# Patient Record
Sex: Female | Born: 1945
Health system: Southern US, Community
[De-identification: ages and names within clinical notes are randomized; demographics above are authoritative.]

## PROBLEM LIST (undated history)

## (undated) DIAGNOSIS — E785 Hyperlipidemia, unspecified: Secondary | ICD-10-CM

## (undated) DIAGNOSIS — M1990 Unspecified osteoarthritis, unspecified site: Secondary | ICD-10-CM

## (undated) DIAGNOSIS — I10 Essential (primary) hypertension: Secondary | ICD-10-CM

## (undated) DIAGNOSIS — F419 Anxiety disorder, unspecified: Secondary | ICD-10-CM

## (undated) DIAGNOSIS — L409 Psoriasis, unspecified: Secondary | ICD-10-CM

## (undated) HISTORY — DX: Hyperlipidemia, unspecified: E78.5

## (undated) HISTORY — DX: Unspecified osteoarthritis, unspecified site: M19.90

## (undated) HISTORY — PX: ABDOMINAL HYSTERECTOMY: SHX81

## (undated) HISTORY — PX: TUBAL LIGATION: SHX77

## (undated) HISTORY — PX: CHOLECYSTECTOMY: SHX55

## (undated) HISTORY — PX: HERNIA REPAIR: SHX51

## (undated) HISTORY — PX: KNEE SURGERY: SHX244

## (undated) HISTORY — DX: Psoriasis, unspecified: L40.9

## (undated) HISTORY — DX: Essential (primary) hypertension: I10

## (undated) HISTORY — DX: Anxiety disorder, unspecified: F41.9

---

## 2004-12-03 ENCOUNTER — Encounter: Admission: RE | Admit: 2004-12-03 | Discharge: 2005-03-03 | Payer: Self-pay | Admitting: Orthopedic Surgery

## 2011-11-08 ENCOUNTER — Encounter: Payer: Self-pay | Admitting: Internal Medicine

## 2011-11-08 DIAGNOSIS — L408 Other psoriasis: Secondary | ICD-10-CM

## 2011-11-08 DIAGNOSIS — D72829 Elevated white blood cell count, unspecified: Secondary | ICD-10-CM

## 2015-05-18 DIAGNOSIS — I10 Essential (primary) hypertension: Secondary | ICD-10-CM | POA: Diagnosis not present

## 2015-06-19 DIAGNOSIS — R5381 Other malaise: Secondary | ICD-10-CM | POA: Diagnosis not present

## 2015-06-19 DIAGNOSIS — R531 Weakness: Secondary | ICD-10-CM | POA: Diagnosis not present

## 2015-06-19 DIAGNOSIS — Z833 Family history of diabetes mellitus: Secondary | ICD-10-CM | POA: Diagnosis not present

## 2015-06-19 DIAGNOSIS — I10 Essential (primary) hypertension: Secondary | ICD-10-CM | POA: Diagnosis not present

## 2015-06-19 DIAGNOSIS — Z79899 Other long term (current) drug therapy: Secondary | ICD-10-CM | POA: Diagnosis not present

## 2015-06-19 DIAGNOSIS — R7309 Other abnormal glucose: Secondary | ICD-10-CM | POA: Diagnosis not present

## 2015-07-17 DIAGNOSIS — I10 Essential (primary) hypertension: Secondary | ICD-10-CM | POA: Diagnosis not present

## 2015-08-16 DIAGNOSIS — I10 Essential (primary) hypertension: Secondary | ICD-10-CM | POA: Diagnosis not present

## 2015-08-16 DIAGNOSIS — R079 Chest pain, unspecified: Secondary | ICD-10-CM | POA: Diagnosis not present

## 2015-09-14 DIAGNOSIS — I10 Essential (primary) hypertension: Secondary | ICD-10-CM | POA: Diagnosis not present

## 2015-09-19 DIAGNOSIS — Z1231 Encounter for screening mammogram for malignant neoplasm of breast: Secondary | ICD-10-CM | POA: Diagnosis not present

## 2015-10-16 DIAGNOSIS — L4050 Arthropathic psoriasis, unspecified: Secondary | ICD-10-CM | POA: Diagnosis not present

## 2015-10-16 DIAGNOSIS — I10 Essential (primary) hypertension: Secondary | ICD-10-CM | POA: Diagnosis not present

## 2015-11-03 DIAGNOSIS — N390 Urinary tract infection, site not specified: Secondary | ICD-10-CM | POA: Diagnosis not present

## 2015-11-03 DIAGNOSIS — M549 Dorsalgia, unspecified: Secondary | ICD-10-CM | POA: Diagnosis not present

## 2015-11-13 DIAGNOSIS — I10 Essential (primary) hypertension: Secondary | ICD-10-CM | POA: Diagnosis not present

## 2015-11-13 DIAGNOSIS — E119 Type 2 diabetes mellitus without complications: Secondary | ICD-10-CM | POA: Diagnosis not present

## 2015-11-13 DIAGNOSIS — E785 Hyperlipidemia, unspecified: Secondary | ICD-10-CM | POA: Diagnosis not present

## 2015-11-13 DIAGNOSIS — E782 Mixed hyperlipidemia: Secondary | ICD-10-CM | POA: Diagnosis not present

## 2015-11-13 DIAGNOSIS — E7800 Pure hypercholesterolemia, unspecified: Secondary | ICD-10-CM | POA: Diagnosis not present

## 2015-11-13 DIAGNOSIS — R5381 Other malaise: Secondary | ICD-10-CM | POA: Diagnosis not present

## 2015-11-13 DIAGNOSIS — Z79899 Other long term (current) drug therapy: Secondary | ICD-10-CM | POA: Diagnosis not present

## 2015-11-13 DIAGNOSIS — N390 Urinary tract infection, site not specified: Secondary | ICD-10-CM | POA: Diagnosis not present

## 2015-11-13 DIAGNOSIS — R531 Weakness: Secondary | ICD-10-CM | POA: Diagnosis not present

## 2015-11-15 DIAGNOSIS — I10 Essential (primary) hypertension: Secondary | ICD-10-CM | POA: Diagnosis not present

## 2015-11-15 DIAGNOSIS — N390 Urinary tract infection, site not specified: Secondary | ICD-10-CM | POA: Diagnosis not present

## 2015-11-15 DIAGNOSIS — E785 Hyperlipidemia, unspecified: Secondary | ICD-10-CM | POA: Diagnosis not present

## 2015-12-20 DIAGNOSIS — I10 Essential (primary) hypertension: Secondary | ICD-10-CM | POA: Diagnosis not present

## 2016-01-24 DIAGNOSIS — Z23 Encounter for immunization: Secondary | ICD-10-CM | POA: Diagnosis not present

## 2016-01-24 DIAGNOSIS — Z79899 Other long term (current) drug therapy: Secondary | ICD-10-CM | POA: Diagnosis not present

## 2016-01-24 DIAGNOSIS — I10 Essential (primary) hypertension: Secondary | ICD-10-CM | POA: Diagnosis not present

## 2016-01-24 DIAGNOSIS — R531 Weakness: Secondary | ICD-10-CM | POA: Diagnosis not present

## 2016-01-24 DIAGNOSIS — R5381 Other malaise: Secondary | ICD-10-CM | POA: Diagnosis not present

## 2016-02-22 DIAGNOSIS — R5381 Other malaise: Secondary | ICD-10-CM | POA: Diagnosis not present

## 2016-02-22 DIAGNOSIS — R531 Weakness: Secondary | ICD-10-CM | POA: Diagnosis not present

## 2016-02-22 DIAGNOSIS — I10 Essential (primary) hypertension: Secondary | ICD-10-CM | POA: Diagnosis not present

## 2016-02-22 DIAGNOSIS — Z79899 Other long term (current) drug therapy: Secondary | ICD-10-CM | POA: Diagnosis not present

## 2016-03-25 DIAGNOSIS — I10 Essential (primary) hypertension: Secondary | ICD-10-CM | POA: Diagnosis not present

## 2016-03-25 DIAGNOSIS — R531 Weakness: Secondary | ICD-10-CM | POA: Diagnosis not present

## 2016-03-25 DIAGNOSIS — Z79899 Other long term (current) drug therapy: Secondary | ICD-10-CM | POA: Diagnosis not present

## 2016-03-25 DIAGNOSIS — R5381 Other malaise: Secondary | ICD-10-CM | POA: Diagnosis not present

## 2016-03-26 DIAGNOSIS — I10 Essential (primary) hypertension: Secondary | ICD-10-CM | POA: Diagnosis not present

## 2016-03-26 DIAGNOSIS — H35032 Hypertensive retinopathy, left eye: Secondary | ICD-10-CM | POA: Diagnosis not present

## 2016-03-26 DIAGNOSIS — H40013 Open angle with borderline findings, low risk, bilateral: Secondary | ICD-10-CM | POA: Diagnosis not present

## 2016-03-26 DIAGNOSIS — H35031 Hypertensive retinopathy, right eye: Secondary | ICD-10-CM | POA: Diagnosis not present

## 2016-04-24 DIAGNOSIS — I10 Essential (primary) hypertension: Secondary | ICD-10-CM | POA: Diagnosis not present

## 2016-04-24 DIAGNOSIS — D631 Anemia in chronic kidney disease: Secondary | ICD-10-CM | POA: Diagnosis not present

## 2016-04-24 DIAGNOSIS — D640 Hereditary sideroblastic anemia: Secondary | ICD-10-CM | POA: Diagnosis not present

## 2016-04-24 DIAGNOSIS — D649 Anemia, unspecified: Secondary | ICD-10-CM | POA: Diagnosis not present

## 2016-04-24 DIAGNOSIS — N181 Chronic kidney disease, stage 1: Secondary | ICD-10-CM | POA: Diagnosis not present

## 2016-05-27 DIAGNOSIS — M25562 Pain in left knee: Secondary | ICD-10-CM | POA: Diagnosis not present

## 2016-05-27 DIAGNOSIS — I10 Essential (primary) hypertension: Secondary | ICD-10-CM | POA: Diagnosis not present

## 2016-06-07 DIAGNOSIS — H40013 Open angle with borderline findings, low risk, bilateral: Secondary | ICD-10-CM | POA: Diagnosis not present

## 2016-06-07 DIAGNOSIS — I10 Essential (primary) hypertension: Secondary | ICD-10-CM | POA: Diagnosis not present

## 2016-06-07 DIAGNOSIS — H2513 Age-related nuclear cataract, bilateral: Secondary | ICD-10-CM | POA: Diagnosis not present

## 2016-06-07 DIAGNOSIS — H35031 Hypertensive retinopathy, right eye: Secondary | ICD-10-CM | POA: Diagnosis not present

## 2016-07-08 DIAGNOSIS — E784 Other hyperlipidemia: Secondary | ICD-10-CM | POA: Diagnosis not present

## 2016-07-08 DIAGNOSIS — J440 Chronic obstructive pulmonary disease with acute lower respiratory infection: Secondary | ICD-10-CM | POA: Diagnosis not present

## 2016-07-08 DIAGNOSIS — F411 Generalized anxiety disorder: Secondary | ICD-10-CM | POA: Diagnosis not present

## 2016-07-08 DIAGNOSIS — R5383 Other fatigue: Secondary | ICD-10-CM | POA: Diagnosis not present

## 2016-07-08 DIAGNOSIS — I10 Essential (primary) hypertension: Secondary | ICD-10-CM | POA: Diagnosis not present

## 2016-07-08 DIAGNOSIS — M545 Low back pain: Secondary | ICD-10-CM | POA: Diagnosis not present

## 2016-07-08 DIAGNOSIS — Z79899 Other long term (current) drug therapy: Secondary | ICD-10-CM | POA: Diagnosis not present

## 2016-08-28 DIAGNOSIS — H40013 Open angle with borderline findings, low risk, bilateral: Secondary | ICD-10-CM | POA: Diagnosis not present

## 2016-08-28 DIAGNOSIS — H2513 Age-related nuclear cataract, bilateral: Secondary | ICD-10-CM | POA: Diagnosis not present

## 2016-08-28 DIAGNOSIS — H35031 Hypertensive retinopathy, right eye: Secondary | ICD-10-CM | POA: Diagnosis not present

## 2016-08-28 DIAGNOSIS — H04123 Dry eye syndrome of bilateral lacrimal glands: Secondary | ICD-10-CM | POA: Diagnosis not present

## 2016-09-05 DIAGNOSIS — F411 Generalized anxiety disorder: Secondary | ICD-10-CM | POA: Diagnosis not present

## 2016-09-05 DIAGNOSIS — R7303 Prediabetes: Secondary | ICD-10-CM | POA: Diagnosis not present

## 2016-09-05 DIAGNOSIS — Z131 Encounter for screening for diabetes mellitus: Secondary | ICD-10-CM | POA: Diagnosis not present

## 2016-09-05 DIAGNOSIS — Z0000 Encounter for general adult medical examination without abnormal findings: Secondary | ICD-10-CM | POA: Diagnosis not present

## 2016-09-05 DIAGNOSIS — I10 Essential (primary) hypertension: Secondary | ICD-10-CM | POA: Diagnosis not present

## 2016-09-05 DIAGNOSIS — L408 Other psoriasis: Secondary | ICD-10-CM | POA: Diagnosis not present

## 2016-09-05 DIAGNOSIS — M545 Low back pain: Secondary | ICD-10-CM | POA: Diagnosis not present

## 2016-09-05 DIAGNOSIS — J440 Chronic obstructive pulmonary disease with acute lower respiratory infection: Secondary | ICD-10-CM | POA: Diagnosis not present

## 2016-09-05 DIAGNOSIS — E784 Other hyperlipidemia: Secondary | ICD-10-CM | POA: Diagnosis not present

## 2016-10-14 DIAGNOSIS — K5180 Other ulcerative colitis without complications: Secondary | ICD-10-CM | POA: Diagnosis not present

## 2016-10-31 DIAGNOSIS — H2513 Age-related nuclear cataract, bilateral: Secondary | ICD-10-CM | POA: Diagnosis not present

## 2016-10-31 DIAGNOSIS — H40013 Open angle with borderline findings, low risk, bilateral: Secondary | ICD-10-CM | POA: Diagnosis not present

## 2016-10-31 DIAGNOSIS — H35032 Hypertensive retinopathy, left eye: Secondary | ICD-10-CM | POA: Diagnosis not present

## 2016-10-31 DIAGNOSIS — H04123 Dry eye syndrome of bilateral lacrimal glands: Secondary | ICD-10-CM | POA: Diagnosis not present

## 2016-11-18 DIAGNOSIS — I10 Essential (primary) hypertension: Secondary | ICD-10-CM | POA: Diagnosis not present

## 2016-11-18 DIAGNOSIS — R1013 Epigastric pain: Secondary | ICD-10-CM | POA: Diagnosis not present

## 2016-11-18 DIAGNOSIS — R197 Diarrhea, unspecified: Secondary | ICD-10-CM | POA: Diagnosis not present

## 2016-11-18 DIAGNOSIS — R1011 Right upper quadrant pain: Secondary | ICD-10-CM | POA: Diagnosis not present

## 2016-11-18 DIAGNOSIS — R109 Unspecified abdominal pain: Secondary | ICD-10-CM | POA: Diagnosis not present

## 2016-11-18 DIAGNOSIS — N390 Urinary tract infection, site not specified: Secondary | ICD-10-CM | POA: Diagnosis not present

## 2016-11-18 DIAGNOSIS — E7800 Pure hypercholesterolemia, unspecified: Secondary | ICD-10-CM | POA: Diagnosis not present

## 2016-11-18 DIAGNOSIS — R319 Hematuria, unspecified: Secondary | ICD-10-CM | POA: Diagnosis not present

## 2016-11-18 DIAGNOSIS — Z8639 Personal history of other endocrine, nutritional and metabolic disease: Secondary | ICD-10-CM | POA: Diagnosis not present

## 2016-11-18 DIAGNOSIS — F17200 Nicotine dependence, unspecified, uncomplicated: Secondary | ICD-10-CM | POA: Diagnosis not present

## 2016-11-18 DIAGNOSIS — Z79899 Other long term (current) drug therapy: Secondary | ICD-10-CM | POA: Diagnosis not present

## 2016-11-26 DIAGNOSIS — K2900 Acute gastritis without bleeding: Secondary | ICD-10-CM | POA: Diagnosis not present

## 2016-11-26 DIAGNOSIS — I10 Essential (primary) hypertension: Secondary | ICD-10-CM | POA: Diagnosis not present

## 2016-12-20 DIAGNOSIS — I10 Essential (primary) hypertension: Secondary | ICD-10-CM | POA: Diagnosis not present

## 2016-12-20 DIAGNOSIS — J440 Chronic obstructive pulmonary disease with acute lower respiratory infection: Secondary | ICD-10-CM | POA: Diagnosis not present

## 2017-02-21 DIAGNOSIS — I10 Essential (primary) hypertension: Secondary | ICD-10-CM | POA: Diagnosis not present

## 2017-02-21 DIAGNOSIS — M170 Bilateral primary osteoarthritis of knee: Secondary | ICD-10-CM | POA: Diagnosis not present

## 2017-02-21 DIAGNOSIS — J440 Chronic obstructive pulmonary disease with acute lower respiratory infection: Secondary | ICD-10-CM | POA: Diagnosis not present

## 2017-02-26 DIAGNOSIS — Z23 Encounter for immunization: Secondary | ICD-10-CM | POA: Diagnosis not present

## 2017-04-28 ENCOUNTER — Encounter (INDEPENDENT_AMBULATORY_CARE_PROVIDER_SITE_OTHER): Payer: Self-pay

## 2017-04-28 ENCOUNTER — Ambulatory Visit (INDEPENDENT_AMBULATORY_CARE_PROVIDER_SITE_OTHER): Payer: Medicare HMO | Admitting: Physician Assistant

## 2017-04-28 ENCOUNTER — Encounter: Payer: Self-pay | Admitting: Physician Assistant

## 2017-04-28 VITALS — BP 91/61 | HR 93 | Temp 97.8°F | Ht 64.0 in | Wt 180.8 lb

## 2017-04-28 DIAGNOSIS — E7841 Elevated Lipoprotein(a): Secondary | ICD-10-CM | POA: Diagnosis not present

## 2017-04-28 DIAGNOSIS — I10 Essential (primary) hypertension: Secondary | ICD-10-CM | POA: Diagnosis not present

## 2017-04-28 DIAGNOSIS — F411 Generalized anxiety disorder: Secondary | ICD-10-CM | POA: Diagnosis not present

## 2017-04-28 DIAGNOSIS — M150 Primary generalized (osteo)arthritis: Secondary | ICD-10-CM

## 2017-04-28 DIAGNOSIS — L409 Psoriasis, unspecified: Secondary | ICD-10-CM | POA: Insufficient documentation

## 2017-04-28 DIAGNOSIS — E785 Hyperlipidemia, unspecified: Secondary | ICD-10-CM | POA: Insufficient documentation

## 2017-04-28 DIAGNOSIS — M159 Polyosteoarthritis, unspecified: Secondary | ICD-10-CM

## 2017-04-28 MED ORDER — HYDROCHLOROTHIAZIDE 25 MG PO TABS: 25.0000 mg | ORAL_TABLET | Freq: Every day | ORAL | 3 refills | Status: DC

## 2017-04-28 MED ORDER — TRAMADOL HCL 50 MG PO TABS: 100.0000 mg | ORAL_TABLET | Freq: Four times a day (QID) | ORAL | 0 refills | Status: DC | PRN

## 2017-04-28 MED ORDER — TRAMADOL HCL 50 MG PO TABS: 100.0000 mg | ORAL_TABLET | Freq: Three times a day (TID) | ORAL | 0 refills | Status: DC | PRN

## 2017-04-28 MED ORDER — ALPRAZOLAM 0.5 MG PO TABS: 0.5000 mg | ORAL_TABLET | Freq: Two times a day (BID) | ORAL | 2 refills | Status: DC

## 2017-04-28 MED ORDER — CITALOPRAM HYDROBROMIDE 20 MG PO TABS: 20.0000 mg | ORAL_TABLET | Freq: Every day | ORAL | 3 refills | Status: DC

## 2017-04-28 NOTE — Progress Notes (Signed)
BP 91/61   Pulse 93   Temp 97.8 F (36.6 C) (Oral)   Ht 5' 4"  (1.626 m)   Wt 180 lb 12.8 oz (82 kg)   BMI 31.03 kg/m    Subjective:    Patient ID: Denise Underwood, female    DOB: Oct 19, 1945, 71 y.o.   MRN: [id number]  HPI: Denise Underwood is a 71 y.o. female presenting on 04/28/2017 for New Patient (Initial Visit) and Establish Care  This patient is a new patient to [place].  She has been seen by Dr. Eula Fried over the past 2 years.  He has retired.  She is brought in by her daughter today.  She needs to be established for multiple medical conditions.  She gotten quite sick with a colitis.  She has lost about 20 pounds.  She feels very weak and tired.  She is particularly dizzy when she stands up.  We have had a long discussion about hypotension.  Because of her weight loss her blood pressure has dropped.  We are going to stop 1 of her blood pressure medicines.  Patient also has a lot of anxiety and depression going on.  It is much worse during the wintertime.  She has had a lot of loss from family members.  She also has arthritis in multiple joints.  There was discussion of knee replacement being needed.  She would like to wait as long as possible.   Relevant past medical, surgical, family and social history reviewed and updated as indicated. Allergies and medications reviewed and updated.  Past Medical History:  Diagnosis Date  . Anxiety   . Arthritis   . Hyperlipidemia   . Hypertension   . Psoriasis     Past Surgical History:  Procedure Laterality Date  . ABDOMINAL HYSTERECTOMY    . CHOLECYSTECTOMY    . HERNIA REPAIR    . KNEE SURGERY    . TUBAL LIGATION      Review of Systems  Constitutional: Positive for fatigue. Negative for activity change and fever.  HENT: Negative.   Eyes: Negative.   Respiratory: Negative.  Negative for cough.   Cardiovascular: Negative.  Negative for chest pain.  Gastrointestinal: Negative.  Negative for abdominal pain.  Endocrine: Negative.     Genitourinary: Negative.  Negative for dysuria.  Musculoskeletal: Positive for arthralgias, back pain and gait problem.  Skin: Negative.   Neurological: Positive for dizziness and weakness.  Psychiatric/Behavioral: Positive for dysphoric mood. Negative for self-injury, sleep disturbance and suicidal ideas. The patient is nervous/anxious.     Allergies as of 04/28/2017   Not on File     Medication List        Accurate as of 04/28/17 10:56 AM. Always use your most recent med list.          ALPRAZolam 0.5 MG tablet Commonly known as:  XANAX Take 1 tablet (0.5 mg total) by mouth 2 (two) times daily. For anxiety.   benazepril 40 MG tablet Commonly known as:  LOTENSIN Take 40 mg by mouth daily.   citalopram 20 MG tablet Commonly known as:  CELEXA Take 1 tablet (20 mg total) by mouth daily. For depression   Fish Oil 1000 MG Caps Take by mouth.   hydrochlorothiazide 25 MG tablet Commonly known as:  HYDRODIURIL Take 1 tablet (25 mg total) by mouth daily. For blood pressure and fluid   mometasone 0.1 % cream Commonly known as:  ELOCON Apply topically 2 (two) times daily.   rosuvastatin 20 MG  tablet Commonly known as:  CRESTOR Take 20 mg by mouth daily.   traMADol 50 MG tablet Commonly known as:  ULTRAM Take 2 tablets (100 mg total) by mouth every 6 (six) hours as needed. For pain   Vitamin D3 1000 units Caps Take by mouth.          Objective:    BP 91/61   Pulse 93   Temp 97.8 F (36.6 C) (Oral)   Ht 5' 4"  (1.626 m)   Wt 180 lb 12.8 oz (82 kg)   BMI 31.03 kg/m   Not on File  Physical Exam  Constitutional: She is oriented to person, place, and time. She appears well-developed and well-nourished.  Hypotensive, increased dizziness with standing  HENT:  Head: Normocephalic and atraumatic.  Right Ear: Tympanic membrane, external ear and ear canal normal.  Left Ear: Tympanic membrane, external ear and ear canal normal.  Nose: Nose normal. No rhinorrhea.   Mouth/Throat: Oropharynx is clear and moist and mucous membranes are normal. No oropharyngeal exudate or posterior oropharyngeal erythema.  Eyes: Conjunctivae and EOM are normal. Pupils are equal, round, and reactive to light.  Neck: Normal range of motion. Neck supple.  Cardiovascular: Normal rate, regular rhythm, normal heart sounds and intact distal pulses.  Pulmonary/Chest: Effort normal and breath sounds normal.  Abdominal: Soft. Bowel sounds are normal.  Neurological: She is alert and oriented to person, place, and time. She has normal reflexes.  Skin: Skin is warm and dry. No rash noted.  Psychiatric: She has a normal mood and affect. Her behavior is normal. Judgment and thought content normal.    No results found for this or any previous visit.    Assessment & Plan:   1. Essential hypertension - benazepril (LOTENSIN) 40 MG tablet; Take 40 mg by mouth daily.; Refill: 1 - hydrochlorothiazide (HYDRODIURIL) 25 MG tablet; Take 1 tablet (25 mg total) by mouth daily. For blood pressure and fluid  Dispense: 90 tablet; Refill: 3 - CBC with Differential/Platelet - CMP14+EGFR - Lipid panel - TSH  2. Psoriasis - mometasone (ELOCON) 0.1 % cream; Apply topically 2 (two) times daily.; Refill: 3  3. Elevated lipoprotein(a)  - rosuvastatin (CRESTOR) 20 MG tablet; Take 20 mg by mouth daily.; Refill: 1 - Lipid panel  4. GAD (generalized anxiety disorder) - citalopram (CELEXA) 20 MG tablet; Take 1 tablet (20 mg total) by mouth daily. For depression  Dispense: 30 tablet; Refill: 3 - CBC with Differential/Platelet - CMP14+EGFR - Lipid panel - TSH - ALPRAZolam (XANAX) 0.5 MG tablet; Take 1 tablet (0.5 mg total) by mouth 2 (two) times daily. For anxiety.  Dispense: 60 tablet; Refill: 2  5. Primary osteoarthritis involving multiple joints - traMADol (ULTRAM) 50 MG tablet; Take 2 tablets (100 mg total) by mouth every 6 (six) hours as needed. For pain  Dispense: 150 tablet; Refill:  0    Current Outpatient Medications:  .  ALPRAZolam (XANAX) 0.5 MG tablet, Take 1 tablet (0.5 mg total) by mouth 2 (two) times daily. For anxiety., Disp: 60 tablet, Rfl: 2 .  benazepril (LOTENSIN) 40 MG tablet, Take 40 mg by mouth daily., Disp: , Rfl: 1 .  Cholecalciferol (VITAMIN D3) 1000 units CAPS, Take by mouth., Disp: , Rfl:  .  citalopram (CELEXA) 20 MG tablet, Take 1 tablet (20 mg total) by mouth daily. For depression, Disp: 30 tablet, Rfl: 3 .  hydrochlorothiazide (HYDRODIURIL) 25 MG tablet, Take 1 tablet (25 mg total) by mouth daily. For blood pressure and fluid,  Disp: 90 tablet, Rfl: 3 .  mometasone (ELOCON) 0.1 % cream, Apply topically 2 (two) times daily., Disp: , Rfl: 3 .  Omega-3 Fatty Acids (FISH OIL) 1000 MG CAPS, Take by mouth., Disp: , Rfl:  .  rosuvastatin (CRESTOR) 20 MG tablet, Take 20 mg by mouth daily., Disp: , Rfl: 1 .  traMADol (ULTRAM) 50 MG tablet, Take 2 tablets (100 mg total) by mouth every 6 (six) hours as needed. For pain, Disp: 150 tablet, Rfl: 0 Continue all other maintenance medications as listed above.  Follow up plan: Return in about 4 weeks (around 05/26/2017) for recheck.  Educational handout given for [place] PA-C [place] [address]  [place], [place] [address] [phone]   04/28/2017, 10:56 AM

## 2017-04-28 NOTE — Patient Instructions (Signed)
In a few days you may receive a survey in the mail or online from [employer] regarding your visit with us today. Please take a moment to fill this out. Your feedback is very important to our whole office. It can help us better understand your needs as well as improve your experience and satisfaction. Thank you for taking your time to complete it. We care about you.  Josiah Nieto, PA-C  

## 2017-04-29 ENCOUNTER — Other Ambulatory Visit: Payer: Self-pay | Admitting: *Deleted

## 2017-04-29 DIAGNOSIS — D72829 Elevated white blood cell count, unspecified: Secondary | ICD-10-CM

## 2017-04-29 LAB — CBC WITH DIFFERENTIAL/PLATELET
Basophils Absolute: 0 10*3/uL (ref 0.0–0.2)
Basos: 0 %
EOS (ABSOLUTE): 0.7 10*3/uL — ABNORMAL HIGH (ref 0.0–0.4)
EOS: 5 %
HEMOGLOBIN: 13.6 g/dL (ref 11.1–15.9)
Hematocrit: 41.7 % (ref 34.0–46.6)
Immature Grans (Abs): 0 10*3/uL (ref 0.0–0.1)
Immature Granulocytes: 0 %
LYMPHS ABS: 4.4 10*3/uL — AB (ref 0.7–3.1)
Lymphs: 28 %
MCH: 30.7 pg (ref 26.6–33.0)
MCHC: 32.6 g/dL (ref 31.5–35.7)
MCV: 94 fL (ref 79–97)
MONOCYTES: 6 %
Monocytes Absolute: 1 10*3/uL — ABNORMAL HIGH (ref 0.1–0.9)
NEUTROS ABS: 9.3 10*3/uL — AB (ref 1.4–7.0)
Neutrophils: 61 %
Platelets: 233 10*3/uL (ref 150–379)
RBC: 4.43 x10E6/uL (ref 3.77–5.28)
RDW: 15.2 % (ref 12.3–15.4)
WBC: 15.5 10*3/uL — AB (ref 3.4–10.8)

## 2017-04-29 LAB — CMP14+EGFR
ALBUMIN: 4.1 g/dL (ref 3.5–4.8)
ALT: 12 IU/L (ref 0–32)
AST: 16 IU/L (ref 0–40)
Albumin/Globulin Ratio: 1.5 (ref 1.2–2.2)
Alkaline Phosphatase: 64 IU/L (ref 39–117)
BILIRUBIN TOTAL: 0.3 mg/dL (ref 0.0–1.2)
BUN / CREAT RATIO: 26 (ref 12–28)
BUN: 32 mg/dL — AB (ref 8–27)
CALCIUM: 10.2 mg/dL (ref 8.7–10.3)
CHLORIDE: 100 mmol/L (ref 96–106)
CO2: 24 mmol/L (ref 20–29)
CREATININE: 1.23 mg/dL — AB (ref 0.57–1.00)
GFR, EST AFRICAN AMERICAN: 51 mL/min/{1.73_m2} — AB (ref >59)
GFR, EST NON AFRICAN AMERICAN: 44 mL/min/{1.73_m2} — AB (ref >59)
GLUCOSE: 100 mg/dL — AB (ref 65–99)
Globulin, Total: 2.7 g/dL (ref 1.5–4.5)
Potassium: 5.1 mmol/L (ref 3.5–5.2)
Sodium: 142 mmol/L (ref 134–144)
TOTAL PROTEIN: 6.8 g/dL (ref 6.0–8.5)

## 2017-04-29 LAB — TSH: TSH: 1.85 u[IU]/mL (ref 0.450–4.500)

## 2017-04-29 LAB — LIPID PANEL
CHOL/HDL RATIO: 3.1 ratio (ref 0.0–4.4)
Cholesterol, Total: 144 mg/dL (ref 100–199)
HDL: 47 mg/dL (ref >39)
LDL CALC: 58 mg/dL (ref 0–99)
Triglycerides: 195 mg/dL — ABNORMAL HIGH (ref 0–149)
VLDL CHOLESTEROL CAL: 39 mg/dL (ref 5–40)

## 2017-05-09 ENCOUNTER — Other Ambulatory Visit: Payer: Medicare HMO

## 2017-05-09 DIAGNOSIS — D72829 Elevated white blood cell count, unspecified: Secondary | ICD-10-CM | POA: Diagnosis not present

## 2017-05-09 LAB — CBC WITH DIFFERENTIAL/PLATELET
BASOS ABS: 0 10*3/uL (ref 0.0–0.2)
Basos: 0 %
EOS (ABSOLUTE): 0.6 10*3/uL — AB (ref 0.0–0.4)
Eos: 4 %
HEMOGLOBIN: 13.3 g/dL (ref 11.1–15.9)
Hematocrit: 42.2 % (ref 34.0–46.6)
Immature Grans (Abs): 0 10*3/uL (ref 0.0–0.1)
Immature Granulocytes: 0 %
LYMPHS ABS: 3.8 10*3/uL — AB (ref 0.7–3.1)
Lymphs: 25 %
MCH: 30.6 pg (ref 26.6–33.0)
MCHC: 31.5 g/dL (ref 31.5–35.7)
MCV: 97 fL (ref 79–97)
MONOCYTES: 5 %
Monocytes Absolute: 0.7 10*3/uL (ref 0.1–0.9)
Neutrophils Absolute: 10.1 10*3/uL — ABNORMAL HIGH (ref 1.4–7.0)
Neutrophils: 66 %
PLATELETS: 230 10*3/uL (ref 150–379)
RBC: 4.35 x10E6/uL (ref 3.77–5.28)
RDW: 15.4 % (ref 12.3–15.4)
WBC: 15.2 10*3/uL — AB (ref 3.4–10.8)

## 2017-05-10 ENCOUNTER — Other Ambulatory Visit: Payer: Self-pay | Admitting: Physician Assistant

## 2017-05-10 DIAGNOSIS — D72829 Elevated white blood cell count, unspecified: Secondary | ICD-10-CM

## 2017-05-12 ENCOUNTER — Telehealth: Payer: Self-pay | Admitting: Oncology

## 2017-05-12 NOTE — Telephone Encounter (Signed)
Spoke with patient re setting up appointment with [facility]. Per patient request called her dtr Lucky CowboyFrances Denise Web at [phone]. Gave dtr appointment with Dr. Darnelle CatalanMagrinat 05/26/17 @ 4pm to arrive 3:30pm. dtr given date/time/location/number.   Referring office closed will call back to confirm appointment.

## 2017-05-22 NOTE — Progress Notes (Signed)
[name]  [phone] [phone]     ID: Denise Underwood DOB: 08/15/1945  MR#: [id number]  RFF#:[id number]  Patient Care Team: Theodoro Clock as PCP - General (Physician Assistant) Magrinat, Virgie Dad, MD as Consulting Physician (Oncology) Neale Burly, MD (Internal Medicine) Jacquiline Doe Marko Stai, MD as Consulting Physician (Internal Medicine) OTHER MD:  CHIEF COMPLAINT: Leukocytosis  CURRENT TREATMENT: Observation   HISTORY OF CURRENT ILLNESS: Denise Underwood recently established herself with Dr. Particia Nearing as her PCP.  Dr. Ronnald Ramp obtained a baseline complete blood count 04/28/2017 which showed a white cell to be 15.5.  Hemoglobin was 13.6, the MCV 94, and the platelet count 233,000.  Differential showed the neutrophils to be 9.3, lymphocytes 4.4, monocytes 1.0 and the eosinophils 0.7, all slightly above normal.  Dr. Ronnald Ramp repeated this test on 05/09/2017 with similar results  According to the patient, this is not a new problem.  She used to see Dr. Minna Antis in [place], [place].  Unfortunately Dr. Minna Antis has retired.  She tells me Dr. Eula Fried told her she had a white cell count and that this has been going on for many years.  He referred her to Dr. Rudolpho Sevin in [place] who also checked her blood and said that her WBC but "it was okay".  Dr. Glenford Peers also has left that area.  She then followed with Dr. Sherrie Sport "but he did not get any blood work.".  (We requested blood work results from Dr. Ky Barban-.  She did have a hemoglobin A1c as her only lab, this was drawn on 09/07/2016 and was 5.8%)..  Patient denies having a cardiologist, dermatologist, or pulmonologist or any other physician who may have drawn blood on her  Her subsequent history is as detailed below.  INTERVAL HISTORY: Denise Underwood was evaluated in the hematology clinic on 05/26/2017 accompanied by her daughters [name] and [place].   REVIEW OF SYSTEMS: Denise Underwood reports that she has had some issues with  urinating but she has been drinking cranberry juice to help. She notes that she has regular bowel movements. She notes that she occasionally get constipated. She notes that she has psoriasis. She notes that she had a bone arthroscopic knee exam done in [place]. She notes that she has weakness in her knees. She notes that she doesn't use a wheel chair at home, but she sometimes uses a walker. She notes that she fell at her daughter's house Christmas Eve 2018. She notes that she misstepped when she was trying to use the walker. She notes that she was sore. She notes that 6 months ago, she fell in her apartment.  She denies unusual headaches, visual changes, nausea, vomiting, or dizziness. There has been no unusual cough, phlegm production, or pleurisy. This been no change in bowel or bladder habits. She denies unexplained fatigue or unexplained weight loss, bleeding, rash, or fever. A detailed review of systems was otherwise stable.    PAST MEDICAL HISTORY: Past Medical History:  Diagnosis Date  . Anxiety   . Arthritis   . Hyperlipidemia   . Hypertension   . Psoriasis     PAST SURGICAL HISTORY: Past Surgical History:  Procedure Laterality Date  . ABDOMINAL HYSTERECTOMY    . CHOLECYSTECTOMY    . HERNIA REPAIR    . KNEE SURGERY    . TUBAL LIGATION     FAMILY HISTORY No family history on file. Father passed at 23 due to old age. Her mother fell and had a concussion that took her  life at 73 years old. She has 6 brothers and 2 sisters. Some are half brothers. She notes that a few brothers were diabetic. She denies lymphomas or any other cancers in the family.  GYNECOLOGIC HISTORY:  menarche: 73 years old Age at first live birth: 72 years old GP: GxP3 LMP: hysterectomy due to frequent periods, no salpingo-oophorectomy HRT: no  SOCIAL HISTORY:  She worked in a [employer] for 18.5 years and at [employer] for 10 years. She lives by herself, independently, in a retirement community. She is divorced. She  notes that her ex-husband used to be in the [employer] and now works at [employer]. Her daughter [name] is a work from Glass blower/designer. Her daughter Rubin Payor is not currently working. Both of her daughters live in [place], [place]. Her son is deceased. She has no grandchildren. She is a Psychologist, forensic.     ADVANCED DIRECTIVES: Not in place    HEALTH MAINTENANCE: Social History   Tobacco Use  . Smoking status: Current Every Day Smoker  . Smokeless tobacco: Never Used  Substance Use Topics  . Alcohol use: No    Frequency: Never  . Drug use: No     Colonoscopy:  PAP:  Bone density:   No Known Allergies  Current Outpatient Medications  Medication Sig Dispense Refill  . ALPRAZolam (XANAX) 0.5 MG tablet Take 1 tablet (0.5 mg total) by mouth 2 (two) times daily. For anxiety. 60 tablet 2  . benazepril (LOTENSIN) 40 MG tablet Take 40 mg by mouth daily.  1  . Cholecalciferol (VITAMIN D3) 1000 units CAPS Take by mouth.    . citalopram (CELEXA) 20 MG tablet Take 1 tablet (20 mg total) by mouth daily. For depression 30 tablet 3  . hydrochlorothiazide (HYDRODIURIL) 25 MG tablet Take 1 tablet (25 mg total) by mouth daily. For blood pressure and fluid 90 tablet 3  . mometasone (ELOCON) 0.1 % cream Apply topically 2 (two) times daily.  3  . Omega-3 Fatty Acids (FISH OIL) 1000 MG CAPS Take by mouth.    . rosuvastatin (CRESTOR) 20 MG tablet Take 20 mg by mouth daily.  1  . traMADol (ULTRAM) 50 MG tablet Take 2 tablets (100 mg total) by mouth every 6 (six) hours as needed. For pain 150 tablet 0   No current facility-administered medications for this visit.     OBJECTIVE: Older white woman in no acute distress  Vitals:   05/26/17 1545  BP: 122/66  Pulse: 85  Resp: 20  Temp: 98.7 F (37.1 C)  SpO2: 93%     Body mass index is 30.61 kg/m.   Wt Readings from Last 3 Encounters:  05/26/17 178 lb 4.8 oz (80.9 kg)  04/28/17 180 lb 12.8 oz (82 kg)      ECOG FS:2 - Symptomatic, <50% confined  to bed  Ocular: Sclerae unicteric, pupils round and equal Lymphatic: No cervical or supraclavicular adenopathy, no axillary adenopathy Lungs no rales or rhonchi Heart regular rate and rhythm Abd soft, obese, nontender, positive bowel sounds MSK no focal spinal tenderness, no joint edema Neuro: non-focal, well-oriented, appropriate affect Breasts: Deferred   LAB RESULTS:  CMP     Component Value Date/Time   NA 142 04/28/2017 1111   K 5.1 04/28/2017 1111   CL 100 04/28/2017 1111   CO2 24 04/28/2017 1111   GLUCOSE 100 (H) 04/28/2017 1111   BUN 32 (H) 04/28/2017 1111   CREATININE 1.23 (H) 04/28/2017 1111   CALCIUM 10.2 04/28/2017 1111  PROT 6.8 04/28/2017 1111   ALBUMIN 4.1 04/28/2017 1111   AST 16 04/28/2017 1111   ALT 12 04/28/2017 1111   ALKPHOS 64 04/28/2017 1111   BILITOT 0.3 04/28/2017 1111   GFRNONAA 44 (L) 04/28/2017 1111   GFRAA 51 (L) 04/28/2017 1111    No results found for: TOTALPROTELP, ALBUMINELP, A1GS, A2GS, BETS, BETA2SER, GAMS, MSPIKE, SPEI  No results found for: KPAFRELGTCHN, LAMBDASER, KAPLAMBRATIO  Lab Results  Component Value Date   WBC 15.2 (H) 05/09/2017   NEUTROABS 10.1 (H) 05/09/2017   HGB 13.3 05/09/2017   HCT 42.2 05/09/2017   MCV 97 05/09/2017   PLT 230 05/09/2017    @LASTCHEMISTRY @  No results found for: LABCA2  No components found for: KGYJEH631  No results for input(s): INR in the last 168 hours.  No results found for: LABCA2  No results found for: SHF026  No results found for: VZC588  No results found for: FOY774  No results found for: CA2729  No components found for: HGQUANT  No results found for: CEA1 / No results found for: CEA1   No results found for: AFPTUMOR  No results found for: CHROMOGRNA  No results found for: PSA1  No visits with results within 3 Day(s) from this visit.  Latest known visit with results is:  Appointment on 05/09/2017  Component Date Value Ref Range Status  . WBC 05/09/2017 15.2* 3.4  - 10.8 x10E3/uL Final  . RBC 05/09/2017 4.35  3.77 - 5.28 x10E6/uL Final  . Hemoglobin 05/09/2017 13.3  11.1 - 15.9 g/dL Final  . Hematocrit 05/09/2017 42.2  34.0 - 46.6 % Final  . MCV 05/09/2017 97  79 - 97 fL Final  . MCH 05/09/2017 30.6  26.6 - 33.0 pg Final  . MCHC 05/09/2017 31.5  31.5 - 35.7 g/dL Final  . RDW 05/09/2017 15.4  12.3 - 15.4 % Final  . Platelets 05/09/2017 230  150 - 379 x10E3/uL Final  . Neutrophils 05/09/2017 66  Not Estab. % Final  . Lymphs 05/09/2017 25  Not Estab. % Final  . Monocytes 05/09/2017 5  Not Estab. % Final  . Eos 05/09/2017 4  Not Estab. % Final  . Basos 05/09/2017 0  Not Estab. % Final  . Neutrophils Absolute 05/09/2017 10.1* 1.4 - 7.0 x10E3/uL Final  . Lymphocytes Absolute 05/09/2017 3.8* 0.7 - 3.1 x10E3/uL Final  . Monocytes Absolute 05/09/2017 0.7  0.1 - 0.9 x10E3/uL Final  . EOS (ABSOLUTE) 05/09/2017 0.6* 0.0 - 0.4 x10E3/uL Final  . Basophils Absolute 05/09/2017 0.0  0.0 - 0.2 x10E3/uL Final  . Immature Granulocytes 05/09/2017 0  Not Estab. % Final  . Immature Grans (Abs) 05/09/2017 0.0  0.0 - 0.1 x10E3/uL Final    (this displays the last labs from the last 3 days)  No results found for: TOTALPROTELP, ALBUMINELP, A1GS, A2GS, BETS, BETA2SER, GAMS, MSPIKE, SPEI (this displays SPEP labs)  No results found for: KPAFRELGTCHN, LAMBDASER, KAPLAMBRATIO (kappa/lambda light chains)  No results found for: HGBA, HGBA2QUANT, HGBFQUANT, HGBSQUAN (Hemoglobinopathy evaluation)   No results found for: LDH  No results found for: IRON, TIBC, IRONPCTSAT (Iron and TIBC)  No results found for: FERRITIN  Urinalysis No results found for: COLORURINE, APPEARANCEUR, LABSPEC, PHURINE, GLUCOSEU, HGBUR, BILIRUBINUR, KETONESUR, PROTEINUR, UROBILINOGEN, NITRITE, LEUKOCYTESUR   STUDIES: No results found.  ELIGIBLE FOR AVAILABLE RESEARCH PROTOCOL:no  ASSESSMENT: 72 y.o. [facility] woman with mild but apparently persistent leukocytosis, primarily  neutrophilia, but also with a slightly elevated lymphocyte and eosinophil count  PLAN:  We spent the better part of today's hour-long appointment discussing the biology of her diagnosis and the specifics of her situation.  She understands that all blood cells are made in the marrow bones.  We discussed the role of blood cells and the fact that hers appear normal, and the role of platelets and the fact that hers also appear normal.  She understands the role of white cells is to participate in infection and inflammation, and that her white cells are out of range.  When we have to of the 3 cell lines normal, the basic problem is unlikely to be in the marrow itself.  Accordingly we are not planning on a bone marrow biopsy.  We reviewed the causes of leukocytosis.  Of course infection is the most common.  She does have some urinary symptoms then should have a urinalysis and.  A second common cause of leukocytosis is inflammation.  She does have psoriasis and particularly patients with psoriatic arthritis have neutrophilia and eosinophilia.  Another possible cause of leukocytosis is medication.  Dub Mikes uses Temovate, which is a steroid, and though she does not use this every day, she uses it perhaps frequently enough that it might cause demargination of neutrophils.  Of course this would elevate the white cell count.  Other causes of leukocytosis include smoking--unfortunately Dub Mikes still smokes, although less than previously, she tells me--and leukemia.    I had written for Dub Mikes to have a repeat CBC and smear today but unfortunately she was not scheduled for that and by the time I saw her the lab was closed.  Accordingly I was not able to do a review of the blood film.  However if her leukocytosis has been present for many years, as she reports, it is unlikely to be leukemic  Finally approximately 2% of the wall population will have a white cell count greater than 10% for purely statistical reasons  I  had hoped to be able to review Dub Mikes is blood smear and also to obtain a repeat CBC, ESR, rheumatoid factor, ANA, and a urinalysis and urine culture as stated, we failed to appropriately schedule this, so I am requesting that this be obtained through her primary care physician Dr. Ronnald Ramp.  A pathology smear review can be requested and a pathologist from [facility] will be able to review the film for any white cell abnormalities  I am also going to be contacting the [facility] group now at the [facility] hematology clinic to see if we can get Dr. Reinaldo Raddle prior records regarding [place].  I think we should have all this in place by around April or so and barring any surprises I will plan to see Dub Mikes again at that time and very likely release her from follow-up once we feel more comfortable with a diagnosis of benign leukocytosis  Incidentally I gave the patient a copy of a healthcare power of attorney form for her to complete and notarized at her discretion.  She tells me she is intending to name her daughter [name] as her healthcare power of attorney  Magrinat, Virgie Dad, MD  05/26/17 4:43 PM Medical Oncology and Hematology [facility] [address] [place], [place] [address] Tel. [phone]    Fax. [phone]  This document serves as a record of services personally performed by Lurline Del, MD. It was created on his behalf by Sheron Nightingale, a trained medical scribe. The creation of this record is based on the scribe's personal observations and the provider's  statements to them.   I have reviewed the above documentation for accuracy and completeness, and I agree with the above.

## 2017-05-26 ENCOUNTER — Encounter: Payer: Self-pay | Admitting: Oncology

## 2017-05-26 ENCOUNTER — Inpatient Hospital Stay: Payer: Medicare HMO | Attending: Oncology | Admitting: Oncology

## 2017-05-26 DIAGNOSIS — D72829 Elevated white blood cell count, unspecified: Secondary | ICD-10-CM | POA: Diagnosis not present

## 2017-05-26 DIAGNOSIS — Z720 Tobacco use: Secondary | ICD-10-CM | POA: Diagnosis not present

## 2017-05-26 DIAGNOSIS — R350 Frequency of micturition: Secondary | ICD-10-CM | POA: Insufficient documentation

## 2017-05-27 ENCOUNTER — Telehealth: Payer: Self-pay

## 2017-05-27 ENCOUNTER — Telehealth: Payer: Self-pay | Admitting: Oncology

## 2017-05-27 ENCOUNTER — Other Ambulatory Visit: Payer: Self-pay | Admitting: Physician Assistant

## 2017-05-27 DIAGNOSIS — D72829 Elevated white blood cell count, unspecified: Secondary | ICD-10-CM

## 2017-05-27 NOTE — Telephone Encounter (Signed)
Scheduled appts per 1/14 los - sending confirmation letter to patient.

## 2017-05-27 NOTE — Telephone Encounter (Signed)
Call received from [facility] in [place].   Pt was seen by Dr Ubaldo Glassingarovsky when cancer care was under [facility].  Telephone number and fax number for [facility] medical records given to this RN. Fax [phone] Phone [phone] This RN faxed a request for the pt's medical records to the above number.

## 2017-05-27 NOTE — Telephone Encounter (Signed)
Call placed to [facility] at [phone] to obtain medical records for pt who was seen by Dr Winona Legatorovaski.  LVM giving reason for call and direct contact number for this office.

## 2017-05-30 ENCOUNTER — Ambulatory Visit (INDEPENDENT_AMBULATORY_CARE_PROVIDER_SITE_OTHER): Payer: Medicare HMO | Admitting: Physician Assistant

## 2017-05-30 ENCOUNTER — Encounter: Payer: Self-pay | Admitting: Physician Assistant

## 2017-05-30 VITALS — BP 76/48 | HR 84 | Temp 98.0°F | Ht 64.0 in | Wt 177.2 lb

## 2017-05-30 DIAGNOSIS — M150 Primary generalized (osteo)arthritis: Secondary | ICD-10-CM

## 2017-05-30 DIAGNOSIS — M159 Polyosteoarthritis, unspecified: Secondary | ICD-10-CM

## 2017-05-30 DIAGNOSIS — F411 Generalized anxiety disorder: Secondary | ICD-10-CM | POA: Diagnosis not present

## 2017-05-30 DIAGNOSIS — E7841 Elevated Lipoprotein(a): Secondary | ICD-10-CM | POA: Diagnosis not present

## 2017-05-30 DIAGNOSIS — R350 Frequency of micturition: Secondary | ICD-10-CM | POA: Diagnosis not present

## 2017-05-30 DIAGNOSIS — D72829 Elevated white blood cell count, unspecified: Secondary | ICD-10-CM | POA: Diagnosis not present

## 2017-05-30 LAB — MICROSCOPIC EXAMINATION
RBC, UA: NONE SEEN /hpf (ref 0–?)
Renal Epithel, UA: NONE SEEN /hpf

## 2017-05-30 LAB — URINALYSIS, COMPLETE
Bilirubin, UA: NEGATIVE
GLUCOSE, UA: NEGATIVE
KETONES UA: NEGATIVE
Nitrite, UA: NEGATIVE
RBC, UA: NEGATIVE
SPEC GRAV UA: 1.02 (ref 1.005–1.030)
Urobilinogen, Ur: 1 mg/dL (ref 0.2–1.0)
pH, UA: 5 (ref 5.0–7.5)

## 2017-05-30 MED ORDER — TRAMADOL HCL 50 MG PO TABS: 100.0000 mg | ORAL_TABLET | Freq: Four times a day (QID) | ORAL | 5 refills | Status: DC | PRN

## 2017-05-30 MED ORDER — ALPRAZOLAM 0.5 MG PO TABS: 0.5000 mg | ORAL_TABLET | Freq: Two times a day (BID) | ORAL | 5 refills | Status: DC

## 2017-05-30 MED ORDER — ROSUVASTATIN CALCIUM 20 MG PO TABS: 20.0000 mg | ORAL_TABLET | Freq: Every day | ORAL | 3 refills | Status: DC

## 2017-05-30 NOTE — Progress Notes (Signed)
BP (!) 76/48   Pulse 84   Temp 98 F (36.7 C) (Oral)   Ht 5\' 4"  (1.626 m)   Wt 177 lb 3.2 oz (80.4 kg)   BMI 30.42 kg/m    Subjective:    Patient ID: Denise FullerFrances S Rudzinski, female    DOB: 01/06/1946, 72 y.o.   MRN: [id number]  HPI: Denise Underwood is a 72 y.o. female presenting on 05/30/2017 for Follow-up (1 month )  This patient comes in for periodic recheck on medications and conditions including high cholesterol, osteoarthritis, anxiety and chronic leukocytosis.  She was recently seen by Dr. Cherre HugerMack would not.  He send us a message asking for certain labs to be ordered.  These will be performed today.  We will for them to his office at the fax number [phone].  She reports overall she is feeling well and not having any difficulty.  Your blood pressure is quite low today and work on her reduce her blood pressure medicine.  All medications are reviewed today. There are no reports of any problems with the medications. All of the medical conditions are reviewed and updated.  Lab work is reviewed and will be ordered as medically necessary. There are no new problems reported with today's visit.   Relevant past medical, surgical, family and social history reviewed and updated as indicated. Allergies and medications reviewed and updated.  Past Medical History:  Diagnosis Date  . Anxiety   . Arthritis   . Hyperlipidemia   . Hypertension   . Psoriasis     Past Surgical History:  Procedure Laterality Date  . ABDOMINAL HYSTERECTOMY    . CHOLECYSTECTOMY    . HERNIA REPAIR    . KNEE SURGERY    . TUBAL LIGATION      Review of Systems  Constitutional: Negative.  Negative for activity change, fatigue and fever.  HENT: Negative.   Eyes: Negative.   Respiratory: Negative.  Negative for cough.   Cardiovascular: Negative.  Negative for chest pain.  Gastrointestinal: Negative.  Negative for abdominal pain.  Endocrine: Negative.   Genitourinary: Negative.  Negative for dysuria.    Musculoskeletal: Negative.   Skin: Negative.   Neurological: Negative.     Allergies as of 05/30/2017   No Known Allergies     Medication List        Accurate as of 05/30/17  1:26 PM. Always use your most recent med list.          ALPRAZolam 0.5 MG tablet Commonly known as:  XANAX Take 1 tablet (0.5 mg total) by mouth 2 (two) times daily. For anxiety.   citalopram 20 MG tablet Commonly known as:  CELEXA Take 1 tablet (20 mg total) by mouth daily. For depression   Fish Oil 1000 MG Caps Take by mouth.   hydrochlorothiazide 25 MG tablet Commonly known as:  HYDRODIURIL Take 1 tablet (25 mg total) by mouth daily. For blood pressure and fluid   mometasone 0.1 % cream Commonly known as:  ELOCON Apply topically 2 (two) times daily.   rosuvastatin 20 MG tablet Commonly known as:  CRESTOR Take 1 tablet (20 mg total) by mouth daily.   traMADol 50 MG tablet Commonly known as:  ULTRAM Take 2 tablets (100 mg total) by mouth every 6 (six) hours as needed. For pain   Vitamin D3 1000 units Caps Take by mouth.          Objective:    BP (!) 76/48   Pulse 84  Temp 98 F (36.7 C) (Oral)   Ht 5\' 4"  (1.626 m)   Wt 177 lb 3.2 oz (80.4 kg)   BMI 30.42 kg/m   No Known Allergies  Physical Exam  Constitutional: She is oriented to person, place, and time. She appears well-developed and well-nourished.  HENT:  Head: Normocephalic and atraumatic.  Eyes: Conjunctivae and EOM are normal. Pupils are equal, round, and reactive to light.  Cardiovascular: Normal rate, regular rhythm, normal heart sounds and intact distal pulses.  Pulmonary/Chest: Effort normal and breath sounds normal.  Abdominal: Soft. Bowel sounds are normal.  Neurological: She is alert and oriented to person, place, and time. She has normal reflexes.  Skin: Skin is warm and dry. No rash noted.  Psychiatric: She has a normal mood and affect. Her behavior is normal. Judgment and thought content normal.  Nursing  note and vitals reviewed.       Assessment & Plan:   1. Elevated lipoprotein(a) - rosuvastatin (CRESTOR) 20 MG tablet; Take 1 tablet (20 mg total) by mouth daily.  Dispense: 90 tablet; Refill: 3  2. Primary osteoarthritis involving multiple joints - traMADol (ULTRAM) 50 MG tablet; Take 2 tablets (100 mg total) by mouth every 6 (six) hours as needed. For pain  Dispense: 150 tablet; Refill: 5  3. GAD (generalized anxiety disorder) - ALPRAZolam (XANAX) 0.5 MG tablet; Take 1 tablet (0.5 mg total) by mouth 2 (two) times daily. For anxiety.  Dispense: 60 tablet; Refill: 5  4. Leukocytosis, unspecified type - Sedimentation rate - Rheumatoid factor - ANA,IFA RA Diag Pnl w/rflx Tit/Patn - CBC with Differential/Platelet - Urine Culture - Pathologist smear review - Urinalysis, Complete    Current Outpatient Medications:  .  ALPRAZolam (XANAX) 0.5 MG tablet, Take 1 tablet (0.5 mg total) by mouth 2 (two) times daily. For anxiety., Disp: 60 tablet, Rfl: 5 .  Cholecalciferol (VITAMIN D3) 1000 units CAPS, Take by mouth., Disp: , Rfl:  .  citalopram (CELEXA) 20 MG tablet, Take 1 tablet (20 mg total) by mouth daily. For depression, Disp: 30 tablet, Rfl: 3 .  hydrochlorothiazide (HYDRODIURIL) 25 MG tablet, Take 1 tablet (25 mg total) by mouth daily. For blood pressure and fluid, Disp: 90 tablet, Rfl: 3 .  mometasone (ELOCON) 0.1 % cream, Apply topically 2 (two) times daily., Disp: , Rfl: 3 .  Omega-3 Fatty Acids (FISH OIL) 1000 MG CAPS, Take by mouth., Disp: , Rfl:  .  rosuvastatin (CRESTOR) 20 MG tablet, Take 1 tablet (20 mg total) by mouth daily., Disp: 90 tablet, Rfl: 3 .  traMADol (ULTRAM) 50 MG tablet, Take 2 tablets (100 mg total) by mouth every 6 (six) hours as needed. For pain, Disp: 150 tablet, Rfl: 5 Continue all other maintenance medications as listed above.  Follow up plan: Return in about 3 months (around 08/28/2017) for recheck.  Educational handout given for survey  Remus Loffler PA-C [facility] [address]  [place], [place] [address] [phone]   05/30/2017, 1:26 PM

## 2017-05-30 NOTE — Patient Instructions (Signed)
In a few days you may receive a survey in the mail or online from [employer] regarding your visit with us today. Please take a moment to fill this out. Your feedback is very important to our whole office. It can help us better understand your needs as well as improve your experience and satisfaction. Thank you for taking your time to complete it. We care about you.  Kysean Sweet, PA-C  

## 2017-05-31 LAB — URINE CULTURE

## 2017-06-01 ENCOUNTER — Other Ambulatory Visit: Payer: Self-pay | Admitting: Physician Assistant

## 2017-06-01 DIAGNOSIS — M159 Polyosteoarthritis, unspecified: Secondary | ICD-10-CM

## 2017-06-01 DIAGNOSIS — M150 Primary generalized (osteo)arthritis: Principal | ICD-10-CM

## 2017-06-02 LAB — CBC WITH DIFFERENTIAL/PLATELET
BASOS ABS: 0 10*3/uL (ref 0.0–0.2)
Basos: 0 %
EOS (ABSOLUTE): 0.5 10*3/uL — AB (ref 0.0–0.4)
Eos: 3 %
HEMOGLOBIN: 12.7 g/dL (ref 11.1–15.9)
Hematocrit: 39.3 % (ref 34.0–46.6)
IMMATURE GRANS (ABS): 0 10*3/uL (ref 0.0–0.1)
Immature Granulocytes: 0 %
LYMPHS: 27 %
Lymphocytes Absolute: 3.8 10*3/uL — ABNORMAL HIGH (ref 0.7–3.1)
MCH: 30.8 pg (ref 26.6–33.0)
MCHC: 32.3 g/dL (ref 31.5–35.7)
MCV: 95 fL (ref 79–97)
MONOCYTES: 5 %
Monocytes Absolute: 0.7 10*3/uL (ref 0.1–0.9)
NEUTROS PCT: 65 %
Neutrophils Absolute: 8.9 10*3/uL — ABNORMAL HIGH (ref 1.4–7.0)
PLATELETS: 223 10*3/uL (ref 150–379)
RBC: 4.12 x10E6/uL (ref 3.77–5.28)
RDW: 14.4 % (ref 12.3–15.4)
WBC: 14 10*3/uL — AB (ref 3.4–10.8)

## 2017-06-02 LAB — SEDIMENTATION RATE: Sed Rate: 43 mm/hr — ABNORMAL HIGH (ref 0–40)

## 2017-06-02 LAB — ANA,IFA RA DIAG PNL W/RFLX TIT/PATN
ANA Titer 1: NEGATIVE
CYCLIC CITRULLIN PEPTIDE AB: 6 U (ref 0–19)

## 2017-06-04 NOTE — Progress Notes (Signed)
Subjective: CC: knee pain PCP: Remus LofflerJones, Angel S, PA-C NFA:OZHYQMVHPI:Denise Underwood is a 72 y.o. female presenting to clinic today for:  1. Knee pain Patient reports a chronic history of right knee pain.  She notes that the entire knee aches.  She actually had arthroscopic surgery performed about 6 years ago.  She notes that pain is an 8/10 today.  It is a 10/10 at its worst.  She takes tramadol prescribed by her PCP twice daily as needed pain.  She has not had a corticosteroid injection to the knee in over a year.  She no longer follows with orthopedist.  She does not desire any repeat surgeries.  She does note knee weakness and instability.  Occasionally has swelling.  Denies increased warmth, fevers, chills.  No numbness or tingling.   ROS: Per HPI  No Known Allergies Past Medical History:  Diagnosis Date  . Anxiety   . Arthritis   . Hyperlipidemia   . Hypertension   . Psoriasis     Current Outpatient Medications:  .  ALPRAZolam (XANAX) 0.5 MG tablet, Take 1 tablet (0.5 mg total) by mouth 2 (two) times daily. For anxiety., Disp: 60 tablet, Rfl: 5 .  Cholecalciferol (VITAMIN D3) 1000 units CAPS, Take by mouth., Disp: , Rfl:  .  citalopram (CELEXA) 20 MG tablet, Take 1 tablet (20 mg total) by mouth daily. For depression, Disp: 30 tablet, Rfl: 3 .  hydrochlorothiazide (HYDRODIURIL) 25 MG tablet, Take 1 tablet (25 mg total) by mouth daily. For blood pressure and fluid, Disp: 90 tablet, Rfl: 3 .  mometasone (ELOCON) 0.1 % cream, Apply topically 2 (two) times daily., Disp: , Rfl: 3 .  Omega-3 Fatty Acids (FISH OIL) 1000 MG CAPS, Take by mouth., Disp: , Rfl:  .  rosuvastatin (CRESTOR) 20 MG tablet, Take 1 tablet (20 mg total) by mouth daily., Disp: 90 tablet, Rfl: 3 .  traMADol (ULTRAM) 50 MG tablet, Take 2 tablets (100 mg total) by mouth every 6 (six) hours as needed. For pain, Disp: 150 tablet, Rfl: 5 Social History   Socioeconomic History  . Marital status: Single    Spouse name: Not on file    . Number of children: Not on file  . Years of education: Not on file  . Highest education level: Not on file  Social Needs  . Financial resource strain: Not on file  . Food insecurity - worry: Not on file  . Food insecurity - inability: Not on file  . Transportation needs - medical: Not on file  . Transportation needs - non-medical: Not on file  Occupational History  . Not on file  Tobacco Use  . Smoking status: Current Every Day Smoker  . Smokeless tobacco: Never Used  Substance and Sexual Activity  . Alcohol use: No    Frequency: Never  . Drug use: No  . Sexual activity: Not on file  Other Topics Concern  . Not on file  Social History Narrative  . Not on file   No family history on file.  Objective: Office vital signs reviewed. BP 102/68   Pulse 70   Temp 97.8 F (36.6 C) (Oral)   Ht 5\' 4"  (1.626 m)   Wt 176 lb (79.8 kg)   BMI 30.21 kg/m   Physical Examination:  General: Awake, alert, obese, No acute distress Extremities: warm, well perfused, No edema, cyanosis or clubbing; +1 pulses bilaterally MSK: antalgic gait and normal station  Right knee: limited Active range of motion in  extension. Mild joint Effusion.  noJoint line tenderness to palpation.  noQuads tendon/ patellar tendon to palpation.  No ligamentous laxity. Thessaly's not tested 2/2 patient's inability to balance on 1 leg. Skin: dry; intact; no rashes or lesions Neuro: 4/5 LE Strength and light touch sensation grossly intact  JOINT INJECTION:  Patient denies allergy to antiseptics (including iodine) and anesthetics.  Patient denies h/o diabetes, frequent steroid use, use of blood thinners/ antiplatelets.  Patient was given informed consent and a signed copy has been placed in the chart. Appropriate time out was taken. Area prepped and draped in usual sterile fashion. Anatomic landmarks were identified and injection site was marked.  Ethyl chloride spray was used to numb the area and 1 cc of  methylprednisolone 40 mg/ml plus 3 cc of 1% lidocaine without epinephrine was injected into the right knee using a(n) anteriomedial approach. The patient tolerated the procedure well and there were no immediate complications. Estimated blood loss is less than 5 cc.  Post procedure instructions were reviewed and handout outlining these instructions were provided to patient.   Assessment/ Plan: 72 y.o. female   1. Chronic pain of right knee No evidence of infection.  Likely secondary to chronic degenerative changes in the right knee.  Risks and benefits of corticosteroid injection reviewed with patient.  She was good understanding.  Signed consent was placed into the chart.  Patient treated with a corticosteroid injection to the right knee today.  Home care instructions were reviewed with patient.  Signs and symptoms of infection were also reviewed.  Patient will follow-up with PCP as needed.    Raliegh Ip, DO [facility] [phone]

## 2017-06-06 ENCOUNTER — Ambulatory Visit (INDEPENDENT_AMBULATORY_CARE_PROVIDER_SITE_OTHER): Payer: Medicare HMO | Admitting: Family Medicine

## 2017-06-06 VITALS — BP 102/68 | HR 70 | Temp 97.8°F | Ht 64.0 in | Wt 176.0 lb

## 2017-06-06 DIAGNOSIS — M25561 Pain in right knee: Secondary | ICD-10-CM

## 2017-06-06 DIAGNOSIS — G8929 Other chronic pain: Secondary | ICD-10-CM

## 2017-06-06 LAB — PATHOLOGIST SMEAR REVIEW
Basophils Absolute: 0 10*3/uL (ref 0.0–0.2)
Basos: 0 %
EOS (ABSOLUTE): 0.5 10*3/uL — AB (ref 0.0–0.4)
Eos: 4 %
HEMATOCRIT: 39.2 % (ref 34.0–46.6)
Hemoglobin: 12.8 g/dL (ref 11.1–15.9)
Immature Grans (Abs): 0 10*3/uL (ref 0.0–0.1)
Immature Granulocytes: 0 %
Lymphocytes Absolute: 3.7 10*3/uL — ABNORMAL HIGH (ref 0.7–3.1)
Lymphs: 26 %
MCH: 31 pg (ref 26.6–33.0)
MCHC: 32.7 g/dL (ref 31.5–35.7)
MCV: 95 fL (ref 79–97)
Monocytes Absolute: 0.9 10*3/uL (ref 0.1–0.9)
Monocytes: 6 %
NEUTROS ABS: 8.8 10*3/uL — AB (ref 1.4–7.0)
Neutrophils: 64 %
PATH REV RBC: NORMAL
Path Rev PLTs: NORMAL
Platelets: 231 10*3/uL (ref 150–379)
RBC: 4.13 x10E6/uL (ref 3.77–5.28)
RDW: 14.3 % (ref 12.3–15.4)
WBC: 13.9 10*3/uL — AB (ref 3.4–10.8)

## 2017-06-06 MED ORDER — METHYLPREDNISOLONE ACETATE 80 MG/ML IJ SUSP
40.0000 mg | Freq: Once | INTRAMUSCULAR | Status: AC
  Administered 2017-06-06: 40 mg via INTRA_ARTICULAR

## 2017-06-06 NOTE — Patient Instructions (Signed)
I value your feedback and appreciate you entrusting us with your care.  If you get a survey, I would appreciate your taking the time to let us know what your experience was like.   Knee Injection, Care After Refer to this sheet in the next few weeks. These instructions provide you with information about caring for yourself after your procedure. Your health care provider may also give you more specific instructions. Your treatment has been planned according to current medical practices, but problems sometimes occur. Call your health care provider if you have any problems or questions after your procedure. What can I expect after the procedure? After the procedure, it is common to have:  Soreness.  Warmth.  Swelling.  You may have more pain, swelling, and warmth than you did before the injection. This reaction may last for about one day. Follow these instructions at home: Bathing  If you were given a bandage (dressing), keep it dry until your health care provider says it can be removed. Ask your health care provider when you can start showering or taking a bath. Managing pain, stiffness, and swelling  If directed, apply ice to the injection area: ? Put ice in a plastic bag. ? Place a towel between your skin and the bag. ? Leave the ice on for 20 minutes, 2-3 times per day.  Do not apply heat to your knee.  Raise the injection area above the level of your heart while you are sitting or lying down. Activity  Avoid strenuous activities for as long as directed by your health care provider. Ask your health care provider when you can return to your normal activities. General instructions  Take medicines only as directed by your health care provider.  Do not take aspirin or other over-the-counter medicines unless your health care provider says you can.  Check your injection site every day for signs of infection. Watch for: ? Redness, swelling, or pain. ? Fluid, blood, or pus.  Follow  your health care provider's instructions about dressing changes and removal. Contact a health care provider if:  You have symptoms at your injection site that last longer than two days after your procedure.  You have redness, swelling, or pain in your injection area.  You have fluid, blood, or pus coming from your injection site.  You have warmth in your injection area.  You have a fever.  Your pain is not controlled with medicine. Get help right away if:  Your knee turns very red.  Your knee becomes very swollen.  Your knee pain is severe. This information is not intended to replace advice given to you by your health care provider. Make sure you discuss any questions you have with your health care provider. Document Released: 05/20/2014 Document Revised: 01/03/2016 Document Reviewed: 03/09/2014 Elsevier Interactive Patient Education  2018 [employer]  

## 2017-06-06 NOTE — Addendum Note (Signed)
Addended byDory Peru: Ladeidra Borys C on: 06/06/2017 01:55 PM   Modules accepted: Orders

## 2017-06-09 ENCOUNTER — Encounter: Payer: Self-pay | Admitting: Oncology

## 2017-06-09 ENCOUNTER — Other Ambulatory Visit: Payer: Self-pay | Admitting: Oncology

## 2017-06-09 NOTE — Progress Notes (Signed)
[place]  [phone] [phone]     ID: BILLIE TRAGER DOB: 06-Aug-1945  MR#: [id number]  ZHY#:[id number]  Patient Care Team: Theodoro Clock as PCP - General (Physician Assistant) Franklin Baumbach, Virgie Dad, MD as Consulting Physician (Oncology) Neale Burly, MD (Internal Medicine) Jacquiline Doe Marko Stai, MD as Consulting Physician (Internal Medicine) OTHER MD:  CHIEF COMPLAINT: Leukocytosis  CURRENT TREATMENT: Observation   HISTORY OF CURRENT ILLNESS: Jaidalyn recently established herself with Dr. Particia Nearing as her PCP.  Dr. Ronnald Ramp obtained a baseline complete blood count 04/28/2017 which showed a white cell to be 15.5.  Hemoglobin was 13.6, the MCV 94, and the platelet count 233,000.  Differential showed the neutrophils to be 9.3, lymphocytes 4.4, monocytes 1.0 and the eosinophils 0.7, all slightly above normal.  Dr. Ronnald Ramp repeated this test on 05/09/2017 with similar results  According to the patient, this is not a new problem.  She used to see Dr. Minna Antis in [place], [place].  Unfortunately Dr. Minna Antis has retired.  She tells me Dr. Eula Fried told her she had a white cell count and that this has been going on for many years.  He referred her to Dr. Rudolpho Sevin in [place] who also checked her blood and said that her WBC but "it was okay".  Dr. Glenford Peers also has left that area.  She then followed with Dr. Sherrie Sport "but he did not get any blood work.".  (We requested blood work results from Dr. Ky Barban-.  She did have a hemoglobin A1c as her only lab, this was drawn on 09/07/2016 and was 5.8%)..  Patient denies having a cardiologist, dermatologist, or pulmonologist or any other physician who may have drawn blood on her  Her subsequent history is as detailed below.  INTERVAL HISTORY: Jawanda was evaluated in the hematology clinic on 05/26/2017 accompanied by her daughters [name] and [place].   REVIEW OF SYSTEMS: Latrise reports that she has had some issues with  urinating but she has been drinking cranberry juice to help. She notes that she has regular bowel movements. She notes that she occasionally get constipated. She notes that she has psoriasis. She notes that she had a bone arthroscopic knee exam done in [place]. She notes that she has weakness in her knees. She notes that she doesn't use a wheel chair at home, but she sometimes uses a walker. She notes that she fell at her daughter's house Christmas Eve 2018. She notes that she misstepped when she was trying to use the walker. She notes that she was sore. She notes that 6 months ago, she fell in her apartment.  She denies unusual headaches, visual changes, nausea, vomiting, or dizziness. There has been no unusual cough, phlegm production, or pleurisy. This been no change in bowel or bladder habits. She denies unexplained fatigue or unexplained weight loss, bleeding, rash, or fever. A detailed review of systems was otherwise stable.    PAST MEDICAL HISTORY: Past Medical History:  Diagnosis Date  . Anxiety   . Arthritis   . Hyperlipidemia   . Hypertension   . Psoriasis     PAST SURGICAL HISTORY: Past Surgical History:  Procedure Laterality Date  . ABDOMINAL HYSTERECTOMY    . CHOLECYSTECTOMY    . HERNIA REPAIR    . KNEE SURGERY    . TUBAL LIGATION     FAMILY HISTORY No family history on file. Father passed at 54 due to old age. Her mother fell and had a concussion that took her  life at 73 years old. She has 6 brothers and 2 sisters. Some are half brothers. She notes that a few brothers were diabetic. She denies lymphomas or any other cancers in the family.  GYNECOLOGIC HISTORY:  menarche: 72 years old Age at first live birth: 72 years old GP: GxP3 LMP: hysterectomy due to frequent periods, no salpingo-oophorectomy HRT: no  SOCIAL HISTORY:  She worked in a [employer] for 18.5 years and at [employer] for 10 years. She lives by herself, independently, in a retirement community. She is divorced. She  notes that her ex-husband used to be in the [employer] and now works at [employer]. Her daughter [name] is a work from Glass blower/designer. Her daughter Rubin Payor is not currently working. Both of her daughters live in [place], [place]. Her son is deceased. She has no grandchildren. She is a Psychologist, forensic.     ADVANCED DIRECTIVES: Not in place    HEALTH MAINTENANCE: Social History   Tobacco Use  . Smoking status: Current Every Day Smoker  . Smokeless tobacco: Never Used  Substance Use Topics  . Alcohol use: No    Frequency: Never  . Drug use: No     Colonoscopy:  PAP:  Bone density:   No Known Allergies  Current Outpatient Medications  Medication Sig Dispense Refill  . ALPRAZolam (XANAX) 0.5 MG tablet Take 1 tablet (0.5 mg total) by mouth 2 (two) times daily. For anxiety. 60 tablet 5  . Cholecalciferol (VITAMIN D3) 1000 units CAPS Take by mouth.    . citalopram (CELEXA) 20 MG tablet Take 1 tablet (20 mg total) by mouth daily. For depression 30 tablet 3  . hydrochlorothiazide (HYDRODIURIL) 25 MG tablet Take 1 tablet (25 mg total) by mouth daily. For blood pressure and fluid 90 tablet 3  . mometasone (ELOCON) 0.1 % cream Apply topically 2 (two) times daily.  3  . Omega-3 Fatty Acids (FISH OIL) 1000 MG CAPS Take by mouth.    . rosuvastatin (CRESTOR) 20 MG tablet Take 1 tablet (20 mg total) by mouth daily. 90 tablet 3  . traMADol (ULTRAM) 50 MG tablet Take 2 tablets (100 mg total) by mouth every 6 (six) hours as needed. For pain 150 tablet 5   No current facility-administered medications for this visit.     OBJECTIVE: Older white woman in no acute distress  There were no vitals filed for this visit.   There is no height or weight on file to calculate BMI.   Wt Readings from Last 3 Encounters:  06/06/17 176 lb (79.8 kg)  05/30/17 177 lb 3.2 oz (80.4 kg)  05/26/17 178 lb 4.8 oz (80.9 kg)      ECOG FS:2 - Symptomatic, <50% confined to bed  Ocular: Sclerae unicteric, pupils round  and equal Lymphatic: No cervical or supraclavicular adenopathy, no axillary adenopathy Lungs no rales or rhonchi Heart regular rate and rhythm Abd soft, obese, nontender, positive bowel sounds MSK no focal spinal tenderness, no joint edema Neuro: non-focal, well-oriented, appropriate affect Breasts: Deferred   LAB RESULTS:  CMP     Component Value Date/Time   NA 142 04/28/2017 1111   K 5.1 04/28/2017 1111   CL 100 04/28/2017 1111   CO2 24 04/28/2017 1111   GLUCOSE 100 (H) 04/28/2017 1111   BUN 32 (H) 04/28/2017 1111   CREATININE 1.23 (H) 04/28/2017 1111   CALCIUM 10.2 04/28/2017 1111   PROT 6.8 04/28/2017 1111   ALBUMIN 4.1 04/28/2017 1111   AST 16 04/28/2017 1111  ALT 12 04/28/2017 1111   ALKPHOS 64 04/28/2017 1111   BILITOT 0.3 04/28/2017 1111   GFRNONAA 44 (L) 04/28/2017 1111   GFRAA 51 (L) 04/28/2017 1111    No results found for: TOTALPROTELP, ALBUMINELP, A1GS, A2GS, BETS, BETA2SER, GAMS, MSPIKE, SPEI  No results found for: KPAFRELGTCHN, LAMBDASER, KAPLAMBRATIO  Lab Results  Component Value Date   WBC 14.0 (H) 05/30/2017   WBC 13.9 (H) 05/30/2017   NEUTROABS 8.9 (H) 05/30/2017   NEUTROABS 8.8 (H) 05/30/2017   HGB 12.7 05/30/2017   HGB 12.8 05/30/2017   HCT 39.3 05/30/2017   HCT 39.2 05/30/2017   MCV 95 05/30/2017   MCV 95 05/30/2017   PLT 223 05/30/2017   PLT 231 05/30/2017    @LASTCHEMISTRY @  No results found for: LABCA2  No components found for: [id number]  No results for input(s): INR in the last 168 hours.  No results found for: LABCA2  No results found for: HOO875  No results found for: ZVJ282  No results found for: SUO156  No results found for: CA2729  No components found for: HGQUANT  No results found for: CEA1 / No results found for: CEA1   No results found for: AFPTUMOR  No results found for: CHROMOGRNA  No results found for: PSA1  No visits with results within 3 Day(s) from this visit.  Latest known visit with results is:   Office Visit on 05/30/2017  Component Date Value Ref Range Status  . Sed Rate 05/30/2017 43* 0 - 40 mm/hr Final  . ANA Titer 1 05/30/2017 Negative   Final   Comment:                                      Negative   <1:80                                      Borderline  1:80                                      Positive   >1:80   . Rhuematoid fact SerPl-aCnc 05/30/2017 <10.0  0.0 - 13.9 IU/mL Final  . Cyclic Citrullin Peptide Ab 05/30/2017 6  0 - 19 units Final   Comment:                           Negative               <20                           Weak positive      20 - 39                           Moderate positive  40 - 59                           Strong positive        >59   . WBC 05/30/2017 14.0* 3.4 - 10.8 x10E3/uL Final  . RBC 05/30/2017 4.12  3.77 - 5.28 x10E6/uL Final  . Hemoglobin 05/30/2017 12.7  11.1 - 15.9 g/dL Final  . Hematocrit 05/30/2017 39.3  34.0 - 46.6 % Final  . MCV 05/30/2017 95  79 - 97 fL Final  . MCH 05/30/2017 30.8  26.6 - 33.0 pg Final  . MCHC 05/30/2017 32.3  31.5 - 35.7 g/dL Final  . RDW 05/30/2017 14.4  12.3 - 15.4 % Final  . Platelets 05/30/2017 223  150 - 379 x10E3/uL Final  . Neutrophils 05/30/2017 65  Not Estab. % Final  . Lymphs 05/30/2017 27  Not Estab. % Final  . Monocytes 05/30/2017 5  Not Estab. % Final  . Eos 05/30/2017 3  Not Estab. % Final  . Basos 05/30/2017 0  Not Estab. % Final  . Neutrophils Absolute 05/30/2017 8.9* 1.4 - 7.0 x10E3/uL Final  . Lymphocytes Absolute 05/30/2017 3.8* 0.7 - 3.1 x10E3/uL Final  . Monocytes Absolute 05/30/2017 0.7  0.1 - 0.9 x10E3/uL Final  . EOS (ABSOLUTE) 05/30/2017 0.5* 0.0 - 0.4 x10E3/uL Final  . Basophils Absolute 05/30/2017 0.0  0.0 - 0.2 x10E3/uL Final  . Immature Granulocytes 05/30/2017 0  Not Estab. % Final  . Immature Grans (Abs) 05/30/2017 0.0  0.0 - 0.1 x10E3/uL Final  . Urine Culture, Routine 05/30/2017 Final report   Final  . Organism ID, Bacteria 05/30/2017 Comment   Final   Comment: Mixed  urogenital flora Less than 10,000 colonies/mL   . Path Rev WBC 05/30/2017 Comment*  Final   Comment: Leukocytosis due to high normal absolute counts for more than one subset. WBCs are mature. Reactivity noted.   . Path Rev RBC 05/30/2017 Normal   Final  . Path Rev PLTs 05/30/2017 Normal   Final  . PATH INTERP BLD-IMP 05/30/2017 Comment*  Final   Observations suggest a reactive or inflammatory process.  Marland Kitchen PATHOLOGIST NAME 05/30/2017 Comment   Final   Reviewed by: Kathrynn Ducking, MD, Pathologist  . WBC 05/30/2017 13.9* 3.4 - 10.8 x10E3/uL Final  . RBC 05/30/2017 4.13  3.77 - 5.28 x10E6/uL Final  . Hemoglobin 05/30/2017 12.8  11.1 - 15.9 g/dL Final  . Hematocrit 05/30/2017 39.2  34.0 - 46.6 % Final  . MCV 05/30/2017 95  79 - 97 fL Final  . MCH 05/30/2017 31.0  26.6 - 33.0 pg Final  . MCHC 05/30/2017 32.7  31.5 - 35.7 g/dL Final  . RDW 05/30/2017 14.3  12.3 - 15.4 % Final  . Platelets 05/30/2017 231  150 - 379 x10E3/uL Final  . Neutrophils 05/30/2017 64  Not Estab. % Final  . Lymphs 05/30/2017 26  Not Estab. % Final  . Monocytes 05/30/2017 6  Not Estab. % Final  . Eos 05/30/2017 4  Not Estab. % Final  . Basos 05/30/2017 0  Not Estab. % Final  . Neutrophils Absolute 05/30/2017 8.8* 1.4 - 7.0 x10E3/uL Final  . Lymphocytes Absolute 05/30/2017 3.7* 0.7 - 3.1 x10E3/uL Final  . Monocytes Absolute 05/30/2017 0.9  0.1 - 0.9 x10E3/uL Final  . EOS (ABSOLUTE) 05/30/2017 0.5* 0.0 - 0.4 x10E3/uL Final  . Basophils Absolute 05/30/2017 0.0  0.0 - 0.2 x10E3/uL Final  . Immature Granulocytes 05/30/2017 0  Not Estab. % Final  . Immature Grans (Abs) 05/30/2017 0.0  0.0 - 0.1 x10E3/uL Final  . Specific Gravity, UA 05/30/2017 1.020  1.005 - 1.030 Final  . pH, UA 05/30/2017 5.0  5.0 - 7.5 Final  . Color, UA 05/30/2017 Yellow  Yellow Final  . Appearance Ur 05/30/2017 Clear  Clear Final  . Leukocytes, UA 05/30/2017 2+* Negative Final  .  Protein, UA 05/30/2017 Trace* Negative/Trace Final  . Glucose, UA  05/30/2017 Negative  Negative Final  . Ketones, UA 05/30/2017 Negative  Negative Final  . RBC, UA 05/30/2017 Negative  Negative Final  . Bilirubin, UA 05/30/2017 Negative  Negative Final  . Urobilinogen, Ur 05/30/2017 1.0  0.2 - 1.0 mg/dL Final  . Nitrite, UA 05/30/2017 Negative  Negative Final  . Microscopic Examination 05/30/2017 See below:   Final  . WBC, UA 05/30/2017 11-30* 0 - 5 /hpf Final  . RBC, UA 05/30/2017 None seen  0 - 2 /hpf Final  . Epithelial Cells (non renal) 05/30/2017 >10* 0 - 10 /hpf Final  . Renal Epithel, UA 05/30/2017 None seen  None seen /hpf Final  . Mucus, UA 05/30/2017 Present  Not Estab. Final  . Bacteria, UA 05/30/2017 Many* None seen/Few Final    (this displays the last labs from the last 3 days)  No results found for: TOTALPROTELP, ALBUMINELP, A1GS, A2GS, BETS, BETA2SER, GAMS, MSPIKE, SPEI (this displays SPEP labs)  No results found for: KPAFRELGTCHN, LAMBDASER, KAPLAMBRATIO (kappa/lambda light chains)  No results found for: HGBA, HGBA2QUANT, HGBFQUANT, HGBSQUAN (Hemoglobinopathy evaluation)   No results found for: LDH  No results found for: IRON, TIBC, IRONPCTSAT (Iron and TIBC)  No results found for: FERRITIN  Urinalysis    Component Value Date/Time   APPEARANCEUR Clear 05/30/2017 1019   GLUCOSEU Negative 05/30/2017 1019   BILIRUBINUR Negative 05/30/2017 1019   PROTEINUR Trace (A) 05/30/2017 1019   NITRITE Negative 05/30/2017 1019   LEUKOCYTESUR 2+ (A) 05/30/2017 1019     STUDIES: No results found.  ELIGIBLE FOR AVAILABLE RESEARCH PROTOCOL:no  ASSESSMENT: 72 y.o. [facility] woman with mild but apparently persistent leukocytosis, primarily neutrophilia, but also with a slightly elevated lymphocyte and eosinophil count  PLAN: We spent the better part of today's hour-long appointment discussing the biology of her diagnosis and the specifics of her situation.  She understands that all blood cells are made in the marrow bones.   We discussed the role of blood cells and the fact that hers appear normal, and the role of platelets and the fact that hers also appear normal.  She understands the role of white cells is to participate in infection and inflammation, and that her white cells are out of range.  When we have to of the 3 cell lines normal, the basic problem is unlikely to be in the marrow itself.  Accordingly we are not planning on a bone marrow biopsy.  We reviewed the causes of leukocytosis.  Of course infection is the most common.  She does have some urinary symptoms then should have a urinalysis and.  A second common cause of leukocytosis is inflammation.  She does have psoriasis and particularly patients with psoriatic arthritis have neutrophilia and eosinophilia.  Another possible cause of leukocytosis is medication.  Dub Mikes uses Temovate, which is a steroid, and though she does not use this every day, she uses it perhaps frequently enough that it might cause demargination of neutrophils.  Of course this would elevate the white cell count.  Other causes of leukocytosis include smoking--unfortunately Dub Mikes still smokes, although less than previously, she tells me--and leukemia.    I had written for Dub Mikes to have a repeat CBC and smear today but unfortunately she was not scheduled for that and by the time I saw her the lab was closed.  Accordingly I was not able to do a review of the blood film.  However if her leukocytosis  has been present for many years, as she reports, it is unlikely to be leukemic  Finally approximately 2% of the wall population will have a white cell count greater than 10% for purely statistical reasons  I had hoped to be able to review Dub Mikes is blood smear and also to obtain a repeat CBC, ESR, rheumatoid factor, ANA, and a urinalysis and urine culture as stated, we failed to appropriately schedule this, so I am requesting that this be obtained through her primary care physician Dr. Ronnald Ramp.  A  pathology smear review can be requested and a pathologist from [facility] will be able to review the film for any white cell abnormalities  I am also going to be contacting the [facility] group now at the [facility] hematology clinic to see if we can get Dr. Reinaldo Raddle prior records regarding Taneshia.  I think we should have all this in place by around April or so and barring any surprises I will plan to see Dub Mikes again at that time and very likely release her from follow-up once we feel more comfortable with a diagnosis of benign leukocytosis  Incidentally I gave the patient a copy of a healthcare power of attorney form for her to complete and notarized at her discretion.  She tells me she is intending to name her daughter [name] as her healthcare power of attorney  Merdith Boyd, Virgie Dad, MD  06/09/17 5:59 PM Medical Oncology and Hematology [facility] [address] [place], [place] [address] Tel. [phone]    Fax. [phone]  This document serves as a record of services personally performed by Lurline Del, MD. It was created on his behalf by Sheron Nightingale, a trained medical scribe. The creation of this record is based on the scribe's personal observations and the provider's statements to them.   I have reviewed the above documentation for accuracy and completeness, and I agree with the above.

## 2017-06-10 ENCOUNTER — Other Ambulatory Visit: Payer: Self-pay | Admitting: Oncology

## 2017-06-10 ENCOUNTER — Telehealth: Payer: Self-pay | Admitting: *Deleted

## 2017-06-10 MED ORDER — NITROFURANTOIN MACROCRYSTAL 100 MG PO CAPS: 100.0000 mg | ORAL_CAPSULE | Freq: Every day | ORAL | 0 refills | Status: DC

## 2017-06-10 NOTE — Progress Notes (Unsigned)
I called Denise Underwood and gave her information on the labs which show no evidence of leukemia no evidence of lupus or other rheumatic disease.  The urine was not clear although the culture was negative.  I asked her to call us to let us know if she has any urinary symptoms in which case we would start her on nitrofurantoin 100 mg daily for 3 weeks and then repeat a white cell count.  I am sending that same information by mail since I was not able to reach her but only left a voicemail

## 2017-06-10 NOTE — Telephone Encounter (Signed)
This RN spoke with pt's daughter per MD call requesting if pt was having any urination issues due to slightly concerning lab.  Angelique BlonderDenise states her mother is having urgency with noted dark urine. She also states her mother has had 2 prior UTI's "that she didn't even know she had and one was so bad it affected her thinking ".  Informed her per MD - recommendation is for macrodantin 100 mg daily for 21 days with lab check at this office upon completion.

## 2017-06-11 ENCOUNTER — Telehealth: Payer: Self-pay | Admitting: Oncology

## 2017-06-11 NOTE — Telephone Encounter (Signed)
Scheduled appt per 1/29 sch message - patient is aware of appt date and time.  

## 2017-06-18 ENCOUNTER — Telehealth: Payer: Self-pay

## 2017-06-18 NOTE — Telephone Encounter (Signed)
Spoke with daughter to confirm lab appointment on the 20th of February at 0745a.  Minerva Endsiffany N Kamorah Nevils RN

## 2017-06-20 ENCOUNTER — Other Ambulatory Visit: Payer: Self-pay | Admitting: *Deleted

## 2017-06-20 DIAGNOSIS — F411 Generalized anxiety disorder: Secondary | ICD-10-CM

## 2017-06-20 MED ORDER — CITALOPRAM HYDROBROMIDE 20 MG PO TABS
20.0000 mg | ORAL_TABLET | Freq: Every day | ORAL | 0 refills | Status: DC
Start: 2017-06-20 — End: 2017-09-05

## 2017-07-02 ENCOUNTER — Inpatient Hospital Stay: Payer: Medicare HMO | Attending: Oncology

## 2017-07-02 ENCOUNTER — Other Ambulatory Visit: Payer: Self-pay | Admitting: Oncology

## 2017-07-02 DIAGNOSIS — R350 Frequency of micturition: Secondary | ICD-10-CM | POA: Diagnosis not present

## 2017-07-02 DIAGNOSIS — D72829 Elevated white blood cell count, unspecified: Secondary | ICD-10-CM | POA: Insufficient documentation

## 2017-07-02 LAB — URINALYSIS, COMPLETE (UACMP) WITH MICROSCOPIC
Bilirubin Urine: NEGATIVE
GLUCOSE, UA: NEGATIVE mg/dL
Hgb urine dipstick: NEGATIVE
Ketones, ur: NEGATIVE mg/dL
NITRITE: NEGATIVE
PROTEIN: NEGATIVE mg/dL
SPECIFIC GRAVITY, URINE: 1.021 (ref 1.005–1.030)
pH: 5 (ref 5.0–8.0)

## 2017-07-02 LAB — CBC WITH DIFFERENTIAL/PLATELET
BASOS ABS: 0 10*3/uL (ref 0.0–0.1)
BASOS PCT: 0 %
EOS ABS: 0.6 10*3/uL — AB (ref 0.0–0.5)
EOS PCT: 3 %
HCT: 38.4 % (ref 34.8–46.6)
Hemoglobin: 12.4 g/dL (ref 11.6–15.9)
LYMPHS PCT: 15 %
Lymphs Abs: 2.6 10*3/uL (ref 0.9–3.3)
MCH: 31.2 pg (ref 25.1–34.0)
MCHC: 32.3 g/dL (ref 31.5–36.0)
MCV: 96.7 fL (ref 79.5–101.0)
Monocytes Absolute: 1.4 10*3/uL — ABNORMAL HIGH (ref 0.1–0.9)
Monocytes Relative: 8 %
Neutro Abs: 12.8 10*3/uL — ABNORMAL HIGH (ref 1.5–6.5)
Neutrophils Relative %: 74 %
PLATELETS: 226 10*3/uL (ref 145–400)
RBC: 3.97 MIL/uL (ref 3.70–5.45)
RDW: 13.6 % (ref 11.2–14.5)
WBC: 17.4 10*3/uL — AB (ref 3.9–10.3)

## 2017-07-02 LAB — SEDIMENTATION RATE: SED RATE: 59 mm/h — AB (ref 0–22)

## 2017-07-03 LAB — RHEUMATOID FACTOR: Rhuematoid fact SerPl-aCnc: 11 IU/mL (ref 0.0–13.9)

## 2017-07-04 LAB — ANTINUCLEAR ANTIBODIES, IFA: ANTINUCLEAR ANTIBODIES, IFA: NEGATIVE

## 2017-07-17 ENCOUNTER — Telehealth: Payer: Self-pay | Admitting: Physician Assistant

## 2017-07-17 NOTE — Telephone Encounter (Signed)
[facility] aware that we know pt is on both meds --- per Prudy FeelerAngel Jones rx both.

## 2017-09-01 NOTE — Progress Notes (Addendum)
No show

## 2017-09-03 ENCOUNTER — Encounter: Payer: Self-pay | Admitting: Oncology

## 2017-09-03 ENCOUNTER — Inpatient Hospital Stay: Payer: Medicare HMO | Attending: Oncology | Admitting: Oncology

## 2017-09-03 ENCOUNTER — Inpatient Hospital Stay: Payer: Medicare HMO

## 2017-09-03 NOTE — Progress Notes (Signed)
No show

## 2017-09-05 ENCOUNTER — Encounter: Payer: Self-pay | Admitting: Physician Assistant

## 2017-09-05 ENCOUNTER — Ambulatory Visit (INDEPENDENT_AMBULATORY_CARE_PROVIDER_SITE_OTHER): Payer: Medicare HMO | Admitting: Physician Assistant

## 2017-09-05 VITALS — BP 118/69 | HR 113 | Temp 99.1°F | Ht 64.0 in | Wt 186.4 lb

## 2017-09-05 DIAGNOSIS — F411 Generalized anxiety disorder: Secondary | ICD-10-CM

## 2017-09-05 DIAGNOSIS — D72820 Lymphocytosis (symptomatic): Secondary | ICD-10-CM | POA: Diagnosis not present

## 2017-09-05 DIAGNOSIS — B356 Tinea cruris: Secondary | ICD-10-CM

## 2017-09-05 DIAGNOSIS — L409 Psoriasis, unspecified: Secondary | ICD-10-CM

## 2017-09-05 DIAGNOSIS — I10 Essential (primary) hypertension: Secondary | ICD-10-CM | POA: Diagnosis not present

## 2017-09-05 DIAGNOSIS — M170 Bilateral primary osteoarthritis of knee: Secondary | ICD-10-CM | POA: Insufficient documentation

## 2017-09-05 MED ORDER — HYDROCHLOROTHIAZIDE 25 MG PO TABS: 25.0000 mg | ORAL_TABLET | Freq: Every day | ORAL | 3 refills | Status: DC

## 2017-09-05 MED ORDER — CLOBETASOL PROP EMOLLIENT BASE 0.05 % EX CREA: 1.0000 "application " | TOPICAL_CREAM | Freq: Two times a day (BID) | CUTANEOUS | 5 refills | Status: DC

## 2017-09-05 MED ORDER — CITALOPRAM HYDROBROMIDE 40 MG PO TABS: 40.0000 mg | ORAL_TABLET | Freq: Every day | ORAL | 3 refills | Status: DC

## 2017-09-05 MED ORDER — ECONAZOLE NITRATE 1 % EX CREA: TOPICAL_CREAM | Freq: Every day | CUTANEOUS | 2 refills | Status: DC

## 2017-09-05 NOTE — Progress Notes (Signed)
BP 118/69   Pulse (!) 113   Temp 99.1 F (37.3 C) (Oral)   Ht 5\' 4"  (1.626 m)   Wt 186 lb 6.4 oz (84.6 kg)   BMI 32.00 kg/m    Subjective:    Patient ID: Denise Underwood, female    DOB: 05/11/1946, 72 y.o.   MRN: [id number]  HPI: Denise Underwood is a 72 y.o. female presenting on 09/05/2017 for Edema (feet )  Patient comes in having excessive amount of fluid in her ankles.  She has had episodes of this before.  She has never been hospitalized for congestive heart failure in the past.  She is unable to take her fluid pill on a regular basis because she has been sitting at the hospital with her brother.  She denies any shortness of breath or wheezing at this time.  She still is having a lot of stress and anxiety related to family matters.  She also is having a flareup of her psoriasis and some redness in the fold at her lower abdomen.    Her bilateral knee OA is bad again.  Spent about 3 months since she had an injection by Dr. Nadine CountsGottschalk.  She would like to be set back up with her for a future set of injections.    Past Medical History:  Diagnosis Date  . Anxiety   . Arthritis   . Hyperlipidemia   . Hypertension   . Psoriasis    Relevant past medical, surgical, family and social history reviewed and updated as indicated. Interim medical history since our last visit reviewed. Allergies and medications reviewed and updated. DATA REVIEWED: CHART IN EPIC  Family History reviewed for pertinent findings.  Review of Systems  Constitutional: Negative.  Negative for activity change, fatigue and fever.  HENT: Negative.   Eyes: Negative.   Respiratory: Negative.  Negative for cough.   Cardiovascular: Positive for leg swelling. Negative for chest pain.  Gastrointestinal: Negative.  Negative for abdominal pain.  Endocrine: Negative.   Genitourinary: Negative.  Negative for dysuria.  Musculoskeletal: Positive for arthralgias, joint swelling and myalgias.  Skin: Negative.     Neurological: Negative.     Allergies as of 09/05/2017   No Known Allergies     Medication List        Accurate as of 09/05/17  2:49 PM. Always use your most recent med list.          ALPRAZolam 0.5 MG tablet Commonly known as:  XANAX Take 1 tablet (0.5 mg total) by mouth 2 (two) times daily. For anxiety.   citalopram 40 MG tablet Commonly known as:  CELEXA Take 1 tablet (40 mg total) by mouth daily. For depression   clobetasol cream 0.05 % Commonly known as:  TEMOVATE Apply 1 application topically 2 (two) times daily.   Clobetasol Prop Emollient Base 0.05 % emollient cream Commonly known as:  CLOBETASOL PROPIONATE E Apply 1 application topically 2 (two) times daily. Apply to ECZEMA areas   econazole nitrate 1 % cream Apply topically daily. Apply to YEAST areas   Fish Oil 1000 MG Caps Take by mouth.   hydrochlorothiazide 25 MG tablet Commonly known as:  HYDRODIURIL Take 1-2 tablets (25-50 mg total) by mouth daily. For blood pressure and fluid   rosuvastatin 20 MG tablet Commonly known as:  CRESTOR Take 1 tablet (20 mg total) by mouth daily.   traMADol 50 MG tablet Commonly known as:  ULTRAM Take 2 tablets (100 mg total)  by mouth every 6 (six) hours as needed. For pain   Vitamin D3 1000 units Caps Take by mouth.          Objective:    BP 118/69   Pulse (!) 113   Temp 99.1 F (37.3 C) (Oral)   Ht 5\' 4"  (1.626 m)   Wt 186 lb 6.4 oz (84.6 kg)   BMI 32.00 kg/m   No Known Allergies  Wt Readings from Last 3 Encounters:  09/05/17 186 lb 6.4 oz (84.6 kg)  06/06/17 176 lb (79.8 kg)  05/30/17 177 lb 3.2 oz (80.4 kg)    Physical Exam  Constitutional: She is oriented to person, place, and time. She appears well-developed and well-nourished.  HENT:  Head: Normocephalic and atraumatic.  Eyes: Pupils are equal, round, and reactive to light. Conjunctivae and EOM are normal.  Cardiovascular: Normal rate, regular rhythm, normal heart sounds and intact distal  pulses.  Pulmonary/Chest: Effort normal and breath sounds normal.  Abdominal: Soft. Bowel sounds are normal.  Neurological: She is alert and oriented to person, place, and time. She has normal reflexes.  Skin: Skin is warm and dry. No rash noted.  Psychiatric: She has a normal mood and affect. Her behavior is normal. Judgment and thought content normal.    Results for orders placed or performed in visit on 07/02/17  CBC with Differential/Platelet  Result Value Ref Range   WBC 17.4 (H) 3.9 - 10.3 K/uL   RBC 3.97 3.70 - 5.45 MIL/uL   Hemoglobin 12.4 11.6 - 15.9 g/dL   HCT 16.1 09.6 - 04.5 %   MCV 96.7 79.5 - 101.0 fL   MCH 31.2 25.1 - 34.0 pg   MCHC 32.3 31.5 - 36.0 g/dL   RDW 40.9 81.1 - 91.4 %   Platelets 226 145 - 400 K/uL   Neutrophils Relative % 74 %   Neutro Abs 12.8 (H) 1.5 - 6.5 K/uL   Lymphocytes Relative 15 %   Lymphs Abs 2.6 0.9 - 3.3 K/uL   Monocytes Relative 8 %   Monocytes Absolute 1.4 (H) 0.1 - 0.9 K/uL   Eosinophils Relative 3 %   Eosinophils Absolute 0.6 (H) 0.0 - 0.5 K/uL   Basophils Relative 0 %   Basophils Absolute 0.0 0.0 - 0.1 K/uL  Sedimentation rate  Result Value Ref Range   Sed Rate 59 (H) 0 - 22 mm/hr  Rheumatoid factor  Result Value Ref Range   Rhuematoid fact SerPl-aCnc 11.0 0.0 - 13.9 IU/mL  ANA, IFA (with reflex)  Result Value Ref Range   ANA Ab, IFA Negative   Urinalysis, Complete w Microscopic  Result Value Ref Range   Color, Urine YELLOW YELLOW   APPearance HAZY (A) CLEAR   Specific Gravity, Urine 1.021 1.005 - 1.030   pH 5.0 5.0 - 8.0   Glucose, UA NEGATIVE NEGATIVE mg/dL   Hgb urine dipstick NEGATIVE NEGATIVE   Bilirubin Urine NEGATIVE NEGATIVE   Ketones, ur NEGATIVE NEGATIVE mg/dL   Protein, ur NEGATIVE NEGATIVE mg/dL   Nitrite NEGATIVE NEGATIVE   Leukocytes, UA LARGE (A) NEGATIVE   RBC / HPF 0-5 0 - 5 RBC/hpf   WBC, UA TOO NUMEROUS TO COUNT 0 - 5 WBC/hpf   Bacteria, UA RARE (A) NONE SEEN   Squamous Epithelial / LPF 6-30 (A)  NONE SEEN   Mucus PRESENT    Hyaline Casts, UA PRESENT       Assessment & Plan:   1. GAD (generalized anxiety disorder) - citalopram (CELEXA)  40 MG tablet; Take 1 tablet (40 mg total) by mouth daily. For depression  Dispense: 90 tablet; Refill: 3  2. Essential hypertension - hydrochlorothiazide (HYDRODIURIL) 25 MG tablet; Take 1-2 tablets (25-50 mg total) by mouth daily. For blood pressure and fluid  Dispense: 180 tablet; Refill: 3  3. Psoriasis - clobetasol cream (TEMOVATE) 0.05 %; Apply 1 application topically 2 (two) times daily. - Clobetasol Prop Emollient Base (CLOBETASOL PROPIONATE E) 0.05 % emollient cream; Apply 1 application topically 2 (two) times daily. Apply to ECZEMA areas  Dispense: 60 g; Refill: 5  4. Lymphocytosis  5. Tinea cruris - econazole nitrate 1 % cream; Apply topically daily. Apply to YEAST areas  Dispense: 85 g; Refill: 2  6. Primary osteoarthritis of both knees Schedule with Dr. Nadine Counts for injections  Continue all other maintenance medications as listed above.  Follow up plan: Return in about 1 month (around 10/03/2017) for recheck.  Educational handout given for survey  Remus Loffler PA-C [facility] [address]  [place], [place] [address] [phone]   09/05/2017, 2:49 PM

## 2017-09-11 ENCOUNTER — Telehealth: Payer: Self-pay | Admitting: Physician Assistant

## 2017-09-16 ENCOUNTER — Ambulatory Visit (INDEPENDENT_AMBULATORY_CARE_PROVIDER_SITE_OTHER): Payer: Medicare HMO | Admitting: Family Medicine

## 2017-09-16 ENCOUNTER — Encounter: Payer: Self-pay | Admitting: Family Medicine

## 2017-09-16 VITALS — BP 119/80 | HR 88 | Temp 97.4°F | Ht 64.0 in | Wt 180.0 lb

## 2017-09-16 DIAGNOSIS — M170 Bilateral primary osteoarthritis of knee: Secondary | ICD-10-CM

## 2017-09-16 DIAGNOSIS — M25562 Pain in left knee: Secondary | ICD-10-CM | POA: Diagnosis not present

## 2017-09-16 DIAGNOSIS — G8929 Other chronic pain: Secondary | ICD-10-CM

## 2017-09-16 DIAGNOSIS — M25561 Pain in right knee: Secondary | ICD-10-CM | POA: Diagnosis not present

## 2017-09-16 MED ORDER — METHYLPREDNISOLONE ACETATE 40 MG/ML IJ SUSP
40.0000 mg | Freq: Once | INTRAMUSCULAR | Status: AC
  Administered 2017-09-16: 40 mg via INTRAMUSCULAR

## 2017-09-16 MED ORDER — METHYLPREDNISOLONE ACETATE 40 MG/ML IJ SUSP
40.0000 mg | Freq: Once | INTRAMUSCULAR | Status: AC
Start: 2017-09-16 — End: 2017-09-16
  Administered 2017-09-16: 40 mg via INTRAMUSCULAR

## 2017-09-16 NOTE — Addendum Note (Signed)
Addended byDory Peru on: 09/16/2017 10:51 AM   Modules accepted: Orders

## 2017-09-16 NOTE — Progress Notes (Signed)
Subjective: CC: Knee pain PCP: Remus Loffler, PA-C [id number] Denise Underwood is a 72 y.o. female presenting to clinic today for:  1. Right Knee pain Patient with chronic right knee pain.  She describes the pain as achy.  Pain is a 9/10 today, and is exacerbated by standing and relieved by sitting.  Patient endorses weakness and instability of the right knee, with occasional swelling.  Denies increased warmth or discoloration.  PCP prescribes tramadol twice daily as needed pain.  Last corticosteroid injection to the right knee was in January 2019.  Past surgical history significant for arthroscopic surgery performed about 6 years ago.  2. Left knee pain Patient reports chronic left knee pain that started after the right knee pain.  She describes the pain as achy.  She notes the pain is not as severe on the left as it is the right.  Symptoms are exacerbated by standing and relieved by sitting.  Her daughter is present who notes that she did very well after the right knee corticosteroid injection in January but that it seemed to start wearing off about a month ago.  She is noticed a change in her walking.  She notes she often refuses to use her walker and as a consequence tends to fall.   ROS: Per HPI  No Known Allergies Past Medical History:  Diagnosis Date  . Anxiety   . Arthritis   . Hyperlipidemia   . Hypertension   . Psoriasis     Current Outpatient Medications:  .  ALPRAZolam (XANAX) 0.5 MG tablet, Take 1 tablet (0.5 mg total) by mouth 2 (two) times daily. For anxiety., Disp: 60 tablet, Rfl: 5 .  Cholecalciferol (VITAMIN D3) 1000 units CAPS, Take by mouth., Disp: , Rfl:  .  citalopram (CELEXA) 40 MG tablet, Take 1 tablet (40 mg total) by mouth daily. For depression, Disp: 90 tablet, Rfl: 3 .  clobetasol cream (TEMOVATE) 0.05 %, Apply 1 application topically 2 (two) times daily., Disp: , Rfl:  .  Clobetasol Prop Emollient Base (CLOBETASOL PROPIONATE E) 0.05 % emollient cream, Apply 1  application topically 2 (two) times daily. Apply to ECZEMA areas, Disp: 60 g, Rfl: 5 .  econazole nitrate 1 % cream, Apply topically daily. Apply to YEAST areas, Disp: 85 g, Rfl: 2 .  hydrochlorothiazide (HYDRODIURIL) 25 MG tablet, Take 1-2 tablets (25-50 mg total) by mouth daily. For blood pressure and fluid, Disp: 180 tablet, Rfl: 3 .  Omega-3 Fatty Acids (FISH OIL) 1000 MG CAPS, Take by mouth., Disp: , Rfl:  .  rosuvastatin (CRESTOR) 20 MG tablet, Take 1 tablet (20 mg total) by mouth daily., Disp: 90 tablet, Rfl: 3 .  traMADol (ULTRAM) 50 MG tablet, Take 2 tablets (100 mg total) by mouth every 6 (six) hours as needed. For pain, Disp: 150 tablet, Rfl: 5 Social History   Socioeconomic History  . Marital status: Single    Spouse name: Not on file  . Number of children: Not on file  . Years of education: Not on file  . Highest education level: Not on file  Occupational History  . Not on file  Social Needs  . Financial resource strain: Not on file  . Food insecurity:    Worry: Not on file    Inability: Not on file  . Transportation needs:    Medical: Not on file    Non-medical: Not on file  Tobacco Use  . Smoking status: Current Every Day Smoker  . Smokeless tobacco: Never  Used  Substance and Sexual Activity  . Alcohol use: No    Frequency: Never  . Drug use: No  . Sexual activity: Not on file  Lifestyle  . Physical activity:    Days per week: Not on file    Minutes per session: Not on file  . Stress: Not on file  Relationships  . Social connections:    Talks on phone: Not on file    Gets together: Not on file    Attends religious service: Not on file    Active member of club or organization: Not on file    Attends meetings of clubs or organizations: Not on file    Relationship status: Not on file  . Intimate partner violence:    Fear of current or ex partner: Not on file    Emotionally abused: Not on file    Physically abused: Not on file    Forced sexual activity: Not  on file  Other Topics Concern  . Not on file  Social History Narrative  . Not on file   No family history on file.  Objective: Office vital signs reviewed. BP 119/80   Pulse 88   Temp (!) 97.4 F (36.3 C) (Oral)   Ht  (1.626 m)   Wt 180 lb (81.6 kg)   BMI 30.90 kg/m   Physical Examination:  General: Awake, alert, obese, No acute distress Extremities: warm, well perfused, No edema, cyanosis or clubbing; +1 pulses bilaterally MSK: antlagic, slow gait  Right knee: Arthritic changes appreciated visually.  There is no increased warmth, erythema or joint effusion appreciated.  No ligamentous laxity.  Active range of motion is limited secondary to pain  Left knee no increased warmth, erythema or joint effusion.  No ligamentous laxity.  Active range of motion: Is fairly preserved. Skin: dry; intact; no rashes or lesions Neuro: ight touch sensation grossly intact  JOINT INJECTION:  Patient denies allergy to antiseptics (including iodine) and anesthetics.  Patient denies h/o diabetes, frequent steroid use, use of blood thinners/ antiplatelets.  Patient was given informed consent and a signed copy has been placed in the chart. Appropriate time out was taken. Area prepped and draped in usual sterile fashion. Anatomic landmarks were identified and injection site was marked.  Ethyl chloride spray was used to numb the area and 1 cc of methylprednisolone 40 mg/ml plus  3 cc of 1% lidocaine without epinephrine was injected into the left knee using a(n) anteriorlateral approach. The patient tolerated the procedure well and there were no immediate complications. Estimated blood loss is less than 1 cc.  Post procedure instructions were reviewed with the patient and her daughter.  JOINT INJECTION:  Patient denies allergy to antiseptics (including iodine) and anesthetics.  Patient denies h/o diabetes, frequent steroid use, use of blood thinners/ antiplatelets.  Patient was given informed consent  and a signed copy has been placed in the chart. Appropriate time out was taken. Area prepped and draped in usual sterile fashion. Anatomic landmarks were identified and injection site was marked.  Ethyl chloride spray was used to numb the area and 1 cc of methylprednisolone 40 mg/ml plus  3 cc of 1% lidocaine without epinephrine was injected into the right knee using a(n) anteriormedial approach. The patient tolerated the procedure well and there were no immediate complications. Estimated blood loss is less than 1 cc.  Post procedure instructions were reviewed.   Assessment/ Plan: 72 y.o. female   1. Chronic pain of right knee Joint  injection performed as above.  There were no immediate complications.  Patient tolerated procedure well.  Home care instructions were reviewed with the patient.  Reasons to return and emergent evaluation the emergency department discussed.  I did review that if she is finding that the corticosteroid injections are wearing off at the 63-month interval that we may consider at least seeing an orthopedist for their input, perhaps considering Visco supplementation would be a good idea.  Her daughter voiced good understanding and will follow-up as needed.  2. Chronic pain of left knee As above  3. Primary osteoarthritis of both knees As above   Ashly Hulen Skains, DO [facility] [phone]

## 2017-09-17 ENCOUNTER — Ambulatory Visit: Payer: Medicare HMO | Admitting: Physician Assistant

## 2017-10-09 ENCOUNTER — Ambulatory Visit: Payer: Medicare HMO

## 2017-10-09 ENCOUNTER — Ambulatory Visit (INDEPENDENT_AMBULATORY_CARE_PROVIDER_SITE_OTHER): Payer: Medicare HMO | Admitting: Family

## 2017-10-09 ENCOUNTER — Ambulatory Visit (INDEPENDENT_AMBULATORY_CARE_PROVIDER_SITE_OTHER): Payer: Medicare HMO

## 2017-10-09 ENCOUNTER — Encounter: Payer: Self-pay | Admitting: Family

## 2017-10-09 VITALS — BP 109/64 | HR 83 | Temp 98.9°F | Ht 64.0 in | Wt 177.0 lb

## 2017-10-09 DIAGNOSIS — G8929 Other chronic pain: Secondary | ICD-10-CM | POA: Diagnosis not present

## 2017-10-09 DIAGNOSIS — W19XXXA Unspecified fall, initial encounter: Secondary | ICD-10-CM

## 2017-10-09 DIAGNOSIS — Y92009 Unspecified place in unspecified non-institutional (private) residence as the place of occurrence of the external cause: Secondary | ICD-10-CM

## 2017-10-09 DIAGNOSIS — M25561 Pain in right knee: Secondary | ICD-10-CM

## 2017-10-09 DIAGNOSIS — S8991XA Unspecified injury of right lower leg, initial encounter: Secondary | ICD-10-CM | POA: Diagnosis not present

## 2017-10-09 DIAGNOSIS — S80211A Abrasion, right knee, initial encounter: Secondary | ICD-10-CM

## 2017-10-09 NOTE — Progress Notes (Signed)
   Subjective:    Patient ID: Denise Underwood, female    DOB: 1946-05-07, 72 y.o.   MRN: [id number]  Chief Complaint  Patient presents with  . right knee pain    Knee Pain   The incident occurred 12 to 24 hours ago. The injury mechanism was a fall. The pain is present in the right knee. The quality of the pain is described as aching. The pain is at a severity of 8/10. The pain is mild. The pain has been intermittent since onset. Pertinent negatives include no numbness or tingling. She reports no foreign bodies present. The symptoms are aggravated by weight bearing. She has tried elevation, rest and NSAIDs for the symptoms. The treatment provided mild relief.  Fall  The accident occurred 12 to 24 hours ago. The fall occurred while standing. She landed on concrete. The point of impact was the right knee. The pain is present in the right knee. Pertinent negatives include no numbness or tingling.      Review of Systems  Musculoskeletal: Positive for arthralgias.  Skin: Positive for wound.  Neurological: Positive for weakness. Negative for tingling and numbness.  All other systems reviewed and are negative.      Objective:   Physical Exam  Constitutional: She is oriented to person, place, and time. She appears well-developed and well-nourished. No distress.  HENT:  Head: Normocephalic.  Eyes: Pupils are equal, round, and reactive to light.  Neck: Normal range of motion. Neck supple. No thyromegaly present.  Cardiovascular: Normal rate, regular rhythm, normal heart sounds and intact distal pulses.  No murmur heard. Pulmonary/Chest: Effort normal and breath sounds normal. No respiratory distress. She has no wheezes.  Abdominal: Soft. Bowel sounds are normal. She exhibits no distension. There is no tenderness.  Musculoskeletal: She exhibits edema (trace swelling in right knee) and tenderness.  Neurological: She is alert and oriented to person, place, and time. She has normal reflexes. No  cranial nerve deficit.  Skin: Skin is warm and dry.  abrasion 2X3 on right knee   Psychiatric: She has a normal mood and affect. Her behavior is normal. Judgment and thought content normal.  Vitals reviewed.   Knee area cleaned, Vaseline dressing applied.   BP 109/64   Pulse 83   Temp 98.9 F (37.2 C) (Oral)   Ht  (1.626 m)   Wt 177 lb (80.3 kg)   BMI 30.38 kg/m      Assessment & Plan:  Denise Underwood comes in today with chief complaint of right knee pain   Diagnosis and orders addressed:  1. Chronic pain of right knee - DG Knee 1-2 Views Right; Future  2. Fall in home, initial encounter - DG Knee 1-2 Views Right; Future -Fall preventions disucssed  3. Abrasion of right knee, initial encounter - DG Knee 1-2 Views Right; Future  Report any s/s of infection, discharge, erythemas, fever, or increase tenderness  Follow up plan: Keep follow up with PCP    Jannifer Rodney, FNP

## 2017-10-09 NOTE — Patient Instructions (Signed)
Abrasion An abrasion is a cut or scrape on the outer surface of your skin. An abrasion does not extend through all of the layers of your skin. It is important to care for your abrasion properly to prevent infection. What are the causes? Most abrasions are caused by falling on or gliding across the ground or another surface. When your skin rubs on something, the outer and inner layer of skin rubs off. What are the signs or symptoms? A cut or scrape is the main symptom of this condition. The scrape may be bleeding, or it may appear red or pink. If there was an associated fall, there may be an underlying bruise. How is this diagnosed? An abrasion is diagnosed with a physical exam. How is this treated? Treatment for this condition depends on how large and deep the abrasion is. Usually, your abrasion will be cleaned with water and mild soap. This removes any dirt or debris that may be stuck. An antibiotic ointment may be applied to the abrasion to help prevent infection. A bandage (dressing) may be placed on the abrasion to keep it clean. You may also need a tetanus shot. Follow these instructions at home: Medicines   Take or apply medicines only as directed by your health care provider.  If you were prescribed an antibiotic ointment, finish all of it even if you start to feel better. Wound care   Clean the wound with mild soap and water 2-3 times per day or as directed by your health care provider. Pat your wound dry with a clean towel. Do not rub it.  There are many different ways to close and cover a wound. Follow instructions from your health care provider about:  Wound care.  Dressing changes and removal.  Check your wound every day for signs of infection. Watch for:  Redness, swelling, or pain.  Fluid, blood, or pus. General instructions    Keep the dressing dry as directed by your health care provider. Do not take baths, swim, use a hot tub, or do anything that would put your  wound underwater until your health care provider approves.  If there is swelling, raise (elevate) the injured area above the level of your heart while you are sitting or lying down.  Keep all follow-up visits as directed by your health care provider. This is important. Contact a health care provider if:  You received a tetanus shot and you have swelling, severe pain, redness, or bleeding at the injection site.  Your pain is not controlled with medicine.  You have increased redness, swelling, or pain at the site of your wound. Get help right away if:  You have a red streak going away from your wound.  You have a fever.  You have fluid, blood, or pus coming from your wound.  You notice a bad smell coming from your wound or your dressing. This information is not intended to replace advice given to you by your health care provider. Make sure you discuss any questions you have with your health care provider. Document Released: 02/06/2005 Document Revised: 12/29/2015 Document Reviewed: 04/27/2014 Elsevier Interactive Patient Education  2017 [employer]  

## 2017-10-17 ENCOUNTER — Ambulatory Visit (INDEPENDENT_AMBULATORY_CARE_PROVIDER_SITE_OTHER): Payer: Medicare HMO | Admitting: Physician Assistant

## 2017-10-17 ENCOUNTER — Encounter: Payer: Self-pay | Admitting: Physician Assistant

## 2017-10-17 ENCOUNTER — Telehealth: Payer: Self-pay

## 2017-10-17 VITALS — BP 103/63 | HR 85 | Temp 98.3°F | Ht 64.0 in | Wt 178.0 lb

## 2017-10-17 DIAGNOSIS — M170 Bilateral primary osteoarthritis of knee: Secondary | ICD-10-CM | POA: Diagnosis not present

## 2017-10-17 DIAGNOSIS — I10 Essential (primary) hypertension: Secondary | ICD-10-CM

## 2017-10-17 DIAGNOSIS — Z716 Tobacco abuse counseling: Secondary | ICD-10-CM | POA: Diagnosis not present

## 2017-10-17 MED ORDER — DICLOFENAC SODIUM 3 % TD GEL: 4.0000 g | Freq: Four times a day (QID) | TRANSDERMAL | 11 refills | Status: DC | PRN

## 2017-10-17 MED ORDER — NICOTINE 14 MG/24HR TD PT24: 14.0000 mg | MEDICATED_PATCH | Freq: Every day | TRANSDERMAL | 1 refills | Status: DC

## 2017-10-17 NOTE — Telephone Encounter (Signed)
Okay, just let he know. She can keep using the biofreeze for now.

## 2017-10-17 NOTE — Telephone Encounter (Signed)
Insurance denied prior auth for Diclofenac gel  

## 2017-10-20 ENCOUNTER — Ambulatory Visit: Payer: Medicare HMO

## 2017-10-20 NOTE — Progress Notes (Signed)
BP 103/63   Pulse 85   Temp 98.3 F (36.8 C) (Oral)   Ht 5\' 4"  (1.626 m)   Wt 178 lb (80.7 kg)   BMI 30.55 kg/m    Subjective:    Patient ID: Denise Underwood, female    DOB: 02-26-1946, 72 y.o.   MRN: [id number]  HPI: Denise Underwood is a 72 y.o. female presenting on 10/17/2017 for Hyperlipidemia (1 month follow up ); Hypertension; and Knee Pain  This patient comes in for periodic recheck on her medical conditions.  She does have osteo-arthritis of both knees and has a great difficulty with walking.  She tries to use a walker.  She has had no recent falls.  She also is here for recheck on her hypertension.  This is been quite well controlled.  She is tolerating her medications very well.  In addition we have discussed smoking cessation and that she is really ready and wanting to do this.  We are going to start patches to see if this can help her completely get away from the cigarettes.  She seems very motivated to work on this.  Due to her chronic conditions of the hypertension, leukocytosis, osteoarthritis.  Her daughter Denise Underwood does need an FMLA completed.  She will have that sent in from her work.  Past Medical History:  Diagnosis Date  . Anxiety   . Arthritis   . Hyperlipidemia   . Hypertension   . Psoriasis    Relevant past medical, surgical, family and social history reviewed and updated as indicated. Interim medical history since our last visit reviewed. Allergies and medications reviewed and updated. DATA REVIEWED: CHART IN EPIC  Family History reviewed for pertinent findings.  Review of Systems  Constitutional: Negative.   HENT: Negative.   Eyes: Negative.   Respiratory: Negative.   Gastrointestinal: Negative.   Genitourinary: Negative.   Musculoskeletal: Positive for arthralgias, gait problem and joint swelling.    Allergies as of 10/17/2017   No Known Allergies     Medication List        Accurate as of 10/17/17 11:59 PM. Always use your most recent med  list.          ALPRAZolam 0.5 MG tablet Commonly known as:  XANAX Take 1 tablet (0.5 mg total) by mouth 2 (two) times daily. For anxiety.   citalopram 40 MG tablet Commonly known as:  CELEXA Take 1 tablet (40 mg total) by mouth daily. For depression   clobetasol cream 0.05 % Commonly known as:  TEMOVATE Apply 1 application topically 2 (two) times daily.   Clobetasol Prop Emollient Base 0.05 % emollient cream Commonly known as:  CLOBETASOL PROPIONATE E Apply 1 application topically 2 (two) times daily. Apply to ECZEMA areas   Diclofenac Sodium 3 % Gel Place 4 g onto the skin 4 (four) times daily as needed.   econazole nitrate 1 % cream Apply topically daily. Apply to YEAST areas   Fish Oil 1000 MG Caps Take by mouth.   hydrochlorothiazide 25 MG tablet Commonly known as:  HYDRODIURIL Take 1-2 tablets (25-50 mg total) by mouth daily. For blood pressure and fluid   nicotine 14 mg/24hr patch Commonly known as:  NICODERM CQ - dosed in mg/24 hours Place 1 patch (14 mg total) onto the skin daily.   rosuvastatin 20 MG tablet Commonly known as:  CRESTOR Take 1 tablet (20 mg total) by mouth daily.   traMADol 50 MG tablet Commonly known as:  ULTRAM Take 2 tablets (100 mg total) by mouth every 6 (six) hours as needed. For pain   Vitamin D3 1000 units Caps Take by mouth.            Durable Medical Equipment  (From admission, onward)        Start     Ordered   10/17/17 0000  DME Other see comment    Comments:  Wheeled walker with seat and basket Dx: M17.0 Bilateral osteoarthritis of knees   10/17/17 0821         Objective:    BP 103/63   Pulse 85   Temp 98.3 F (36.8 C) (Oral)   Ht 5\' 4"  (1.626 m)   Wt 178 lb (80.7 kg)   BMI 30.55 kg/m   No Known Allergies  Wt Readings from Last 3 Encounters:  10/17/17 178 lb (80.7 kg)  10/09/17 177 lb (80.3 kg)  09/16/17 180 lb (81.6 kg)    Physical Exam  Constitutional: She is oriented to person, place, and  time. She appears well-developed and well-nourished.  HENT:  Head: Normocephalic and atraumatic.  Eyes: Pupils are equal, round, and reactive to light. Conjunctivae and EOM are normal.  Cardiovascular: Normal rate, regular rhythm, normal heart sounds and intact distal pulses.  Pulmonary/Chest: Effort normal and breath sounds normal.  Abdominal: Soft. Bowel sounds are normal.  Musculoskeletal:       Right knee: She exhibits decreased range of motion and swelling. Tenderness found.       Left knee: She exhibits decreased range of motion and swelling.  Neurological: She is alert and oriented to person, place, and time. She has normal reflexes.  Skin: Skin is warm and dry. No rash noted.  Psychiatric: She has a normal mood and affect. Her behavior is normal. Judgment and thought content normal.    Results for orders placed or performed in visit on 07/02/17  CBC with Differential/Platelet  Result Value Ref Range   WBC 17.4 (H) 3.9 - 10.3 K/uL   RBC 3.97 3.70 - 5.45 MIL/uL   Hemoglobin 12.4 11.6 - 15.9 g/dL   HCT 16.1 09.6 - 04.5 %   MCV 96.7 79.5 - 101.0 fL   MCH 31.2 25.1 - 34.0 pg   MCHC 32.3 31.5 - 36.0 g/dL   RDW 40.9 81.1 - 91.4 %   Platelets 226 145 - 400 K/uL   Neutrophils Relative % 74 %   Neutro Abs 12.8 (H) 1.5 - 6.5 K/uL   Lymphocytes Relative 15 %   Lymphs Abs 2.6 0.9 - 3.3 K/uL   Monocytes Relative 8 %   Monocytes Absolute 1.4 (H) 0.1 - 0.9 K/uL   Eosinophils Relative 3 %   Eosinophils Absolute 0.6 (H) 0.0 - 0.5 K/uL   Basophils Relative 0 %   Basophils Absolute 0.0 0.0 - 0.1 K/uL  Sedimentation rate  Result Value Ref Range   Sed Rate 59 (H) 0 - 22 mm/hr  Rheumatoid factor  Result Value Ref Range   Rhuematoid fact SerPl-aCnc 11.0 0.0 - 13.9 IU/mL  ANA, IFA (with reflex)  Result Value Ref Range   ANA Ab, IFA Negative   Urinalysis, Complete w Microscopic  Result Value Ref Range   Color, Urine YELLOW YELLOW   APPearance HAZY (A) CLEAR   Specific Gravity, Urine  1.021 1.005 - 1.030   pH 5.0 5.0 - 8.0   Glucose, UA NEGATIVE NEGATIVE mg/dL   Hgb urine dipstick NEGATIVE NEGATIVE   Bilirubin Urine NEGATIVE NEGATIVE  Ketones, ur NEGATIVE NEGATIVE mg/dL   Protein, ur NEGATIVE NEGATIVE mg/dL   Nitrite NEGATIVE NEGATIVE   Leukocytes, UA LARGE (A) NEGATIVE   RBC / HPF 0-5 0 - 5 RBC/hpf   WBC, UA TOO NUMEROUS TO COUNT 0 - 5 WBC/hpf   Bacteria, UA RARE (A) NONE SEEN   Squamous Epithelial / LPF 6-30 (A) NONE SEEN   Mucus PRESENT    Hyaline Casts, UA PRESENT       Assessment & Plan:   1. Encounter for smoking cessation counseling - nicotine (NICODERM CQ - DOSED IN MG/24 HOURS) 14 mg/24hr patch; Place 1 patch (14 mg total) onto the skin daily.  Dispense: 28 patch; Refill: 1  2. Primary osteoarthritis of both knees - DME Other see comment  3. Essential hypertension Continue medication   Continue all other maintenance medications as listed above.  Follow up plan: Return in about 4 months (around 02/16/2018) for recheck.  Educational handout given for survey  Daughter to have FMLA paperwork sent in from her employer  Remus LofflerAngel S. Muneeb Veras PA-C [facility] [address]  [place], [place] [id number] [phone]   10/20/2017, 11:43 AM

## 2017-10-21 DIAGNOSIS — Z0289 Encounter for other administrative examinations: Secondary | ICD-10-CM

## 2017-10-22 ENCOUNTER — Encounter: Payer: Self-pay | Admitting: Physician Assistant

## 2017-11-19 ENCOUNTER — Ambulatory Visit (INDEPENDENT_AMBULATORY_CARE_PROVIDER_SITE_OTHER): Payer: Medicare HMO | Admitting: Physician Assistant

## 2017-11-19 ENCOUNTER — Encounter: Payer: Self-pay | Admitting: Physician Assistant

## 2017-11-19 VITALS — BP 104/63 | HR 96 | Temp 99.3°F | Ht 64.0 in | Wt 179.8 lb

## 2017-11-19 DIAGNOSIS — L409 Psoriasis, unspecified: Secondary | ICD-10-CM

## 2017-11-19 DIAGNOSIS — B354 Tinea corporis: Secondary | ICD-10-CM

## 2017-11-19 MED ORDER — FLUCONAZOLE 150 MG PO TABS: 150.0000 mg | ORAL_TABLET | Freq: Once | ORAL | 0 refills | Status: AC

## 2017-11-19 MED ORDER — PREDNISOLONE 5 MG PO TABS: 5.0000 mg | ORAL_TABLET | Freq: Two times a day (BID) | ORAL | 0 refills | Status: DC

## 2017-11-19 MED ORDER — CEPHALEXIN 500 MG PO CAPS: 500.0000 mg | ORAL_CAPSULE | Freq: Two times a day (BID) | ORAL | 0 refills | Status: DC

## 2017-11-19 NOTE — Patient Instructions (Signed)
aquaphor

## 2017-11-20 NOTE — Progress Notes (Signed)
BP 104/63   Pulse 96   Temp 99.3 F (37.4 C) (Oral)   Ht 5\' 4"  (1.626 m)   Wt 179 lb 12.8 oz (81.6 kg)   BMI 30.86 kg/m    Subjective:    Patient ID: Denise Underwood, female    DOB: 1945/09/18, 72 y.o.   MRN: [id number]  HPI: Denise Underwood is a 72 y.o. female presenting on 11/19/2017 for Vaginitis  This patient comes in to go for a rash that she has horrible.  She has known cirrhosis.  We gave her a steroid cream.  She was having a slight bit of redness in the folds and we had given her antifungal cream.  She was instructed on how to use it and she does feel that she continue to use that in the appropriate places.  However her rash is extremely bad at this time her whole body is covered in the psoriatic small plaques and her entire lower abdomen and buttocks and under her breast are brought in..  With severe pain.  We have discussed dermatology evaluation and she is in agreements.  We are going to give her oral medications and have asked her to stop topicals for now.  Past Medical History:  Diagnosis Date  . Anxiety   . Arthritis   . Hyperlipidemia   . Hypertension   . Psoriasis    Relevant past medical, surgical, family and social history reviewed and updated as indicated. Interim medical history since our last visit reviewed. Allergies and medications reviewed and updated. DATA REVIEWED: CHART IN EPIC  Family History reviewed for pertinent findings.  Review of Systems  Constitutional: Negative.   HENT: Negative.   Eyes: Negative.   Respiratory: Negative.   Gastrointestinal: Negative.   Genitourinary: Negative.   Skin: Positive for color change, rash and wound.    Allergies as of 11/19/2017   No Known Allergies     Medication List        Accurate as of 11/19/17 11:59 PM. Always use your most recent med list.          ALPRAZolam 0.5 MG tablet Commonly known as:  XANAX Take 1 tablet (0.5 mg total) by mouth 2 (two) times daily. For anxiety.   cephALEXin 500  MG capsule Commonly known as:  KEFLEX Take 1 capsule (500 mg total) by mouth 2 (two) times daily.   citalopram 40 MG tablet Commonly known as:  CELEXA Take 1 tablet (40 mg total) by mouth daily. For depression   clobetasol cream 0.05 % Commonly known as:  TEMOVATE Apply 1 application topically 2 (two) times daily.   Clobetasol Prop Emollient Base 0.05 % emollient cream Commonly known as:  CLOBETASOL PROPIONATE E Apply 1 application topically 2 (two) times daily. Apply to ECZEMA areas   Diclofenac Sodium 3 % Gel Place 4 g onto the skin 4 (four) times daily as needed.   econazole nitrate 1 % cream Apply topically daily. Apply to YEAST areas   Fish Oil 1000 MG Caps Take by mouth.   fluconazole 150 MG tablet Commonly known as:  DIFLUCAN Take 1 tablet (150 mg total) by mouth once for 1 dose.   hydrochlorothiazide 25 MG tablet Commonly known as:  HYDRODIURIL Take 1-2 tablets (25-50 mg total) by mouth daily. For blood pressure and fluid   nicotine 14 mg/24hr patch Commonly known as:  NICODERM CQ - dosed in mg/24 hours Place 1 patch (14 mg total) onto the skin daily.  prednisoLONE 5 MG Tabs tablet Take 1 tablet (5 mg total) by mouth 2 (two) times daily.   rosuvastatin 20 MG tablet Commonly known as:  CRESTOR Take 1 tablet (20 mg total) by mouth daily.   traMADol 50 MG tablet Commonly known as:  ULTRAM Take 2 tablets (100 mg total) by mouth every 6 (six) hours as needed. For pain   Vitamin D3 1000 units Caps Take by mouth.          Objective:    BP 104/63   Pulse 96   Temp 99.3 F (37.4 C) (Oral)   Ht 5\' 4"  (1.626 m)   Wt 179 lb 12.8 oz (81.6 kg)   BMI 30.86 kg/m   No Known Allergies  Wt Readings from Last 3 Encounters:  11/19/17 179 lb 12.8 oz (81.6 kg)  10/17/17 178 lb (80.7 kg)  10/09/17 177 lb (80.3 kg)    Physical Exam  Constitutional: She is oriented to person, place, and time. She appears well-developed and well-nourished.  HENT:  Head:  Normocephalic and atraumatic.  Eyes: Pupils are equal, round, and reactive to light. Conjunctivae and EOM are normal.  Cardiovascular: Normal rate, regular rhythm, normal heart sounds and intact distal pulses.  Pulmonary/Chest: Effort normal and breath sounds normal.  Abdominal: Soft. Bowel sounds are normal.  Neurological: She is alert and oriented to person, place, and time. She has normal reflexes.  Skin: Skin is warm and dry. No rash noted. There is erythema.     Large areas of dark red denuded skin, in folds and buttocks and is very painful.  Rest of body with small raised scaling plaques, cream applied  Psychiatric: She has a normal mood and affect. Her behavior is normal. Judgment and thought content normal.    Results for orders placed or performed in visit on 07/02/17  CBC with Differential/Platelet  Result Value Ref Range   WBC 17.4 (H) 3.9 - 10.3 K/uL   RBC 3.97 3.70 - 5.45 MIL/uL   Hemoglobin 12.4 11.6 - 15.9 g/dL   HCT 40.938.4 81.134.8 - 91.446.6 %   MCV 96.7 79.5 - 101.0 fL   MCH 31.2 25.1 - 34.0 pg   MCHC 32.3 31.5 - 36.0 g/dL   RDW 78.213.6 95.611.2 - 21.314.5 %   Platelets 226 145 - 400 K/uL   Neutrophils Relative % 74 %   Neutro Abs 12.8 (H) 1.5 - 6.5 K/uL   Lymphocytes Relative 15 %   Lymphs Abs 2.6 0.9 - 3.3 K/uL   Monocytes Relative 8 %   Monocytes Absolute 1.4 (H) 0.1 - 0.9 K/uL   Eosinophils Relative 3 %   Eosinophils Absolute 0.6 (H) 0.0 - 0.5 K/uL   Basophils Relative 0 %   Basophils Absolute 0.0 0.0 - 0.1 K/uL  Sedimentation rate  Result Value Ref Range   Sed Rate 59 (H) 0 - 22 mm/hr  Rheumatoid factor  Result Value Ref Range   Rhuematoid fact SerPl-aCnc 11.0 0.0 - 13.9 IU/mL  ANA, IFA (with reflex)  Result Value Ref Range   ANA Ab, IFA Negative   Urinalysis, Complete w Microscopic  Result Value Ref Range   Color, Urine YELLOW YELLOW   APPearance HAZY (A) CLEAR   Specific Gravity, Urine 1.021 1.005 - 1.030   pH 5.0 5.0 - 8.0   Glucose, UA NEGATIVE NEGATIVE mg/dL    Hgb urine dipstick NEGATIVE NEGATIVE   Bilirubin Urine NEGATIVE NEGATIVE   Ketones, ur NEGATIVE NEGATIVE mg/dL   Protein, ur NEGATIVE  NEGATIVE mg/dL   Nitrite NEGATIVE NEGATIVE   Leukocytes, UA LARGE (A) NEGATIVE   RBC / HPF 0-5 0 - 5 RBC/hpf   WBC, UA TOO NUMEROUS TO COUNT 0 - 5 WBC/hpf   Bacteria, UA RARE (A) NONE SEEN   Squamous Epithelial / LPF 6-30 (A) NONE SEEN   Mucus PRESENT    Hyaline Casts, UA PRESENT       Assessment & Plan:   1. Psoriasis - prednisoLONE 5 MG TABS tablet; Take 1 tablet (5 mg total) by mouth 2 (two) times daily.  Dispense: 60 each; Refill: 0 - Ambulatory referral to Dermatology  2. Tinea corporis - fluconazole (DIFLUCAN) 150 MG tablet; Take 1 tablet (150 mg total) by mouth once for 1 dose.  Dispense: 1 tablet; Refill: 0 - cephALEXin (KEFLEX) 500 MG capsule; Take 1 capsule (500 mg total) by mouth 2 (two) times daily.  Dispense: 20 capsule; Refill: 0 - Ambulatory referral to Dermatology    Continue all other maintenance medications as listed above.  Follow up plan: Return in about 1 week (around 11/26/2017) for recheck.  Educational handout given for survey  Remus Loffler PA-C [facility] [address]  [place], [place] [address] [phone]   11/20/2017, 9:53 PM

## 2017-11-26 ENCOUNTER — Ambulatory Visit (INDEPENDENT_AMBULATORY_CARE_PROVIDER_SITE_OTHER): Payer: Medicare HMO | Admitting: Physician Assistant

## 2017-11-26 ENCOUNTER — Encounter: Payer: Self-pay | Admitting: Physician Assistant

## 2017-11-26 DIAGNOSIS — B354 Tinea corporis: Secondary | ICD-10-CM | POA: Diagnosis not present

## 2017-11-26 DIAGNOSIS — L409 Psoriasis, unspecified: Secondary | ICD-10-CM

## 2017-11-26 DIAGNOSIS — F411 Generalized anxiety disorder: Secondary | ICD-10-CM | POA: Diagnosis not present

## 2017-11-26 MED ORDER — FLUCONAZOLE 150 MG PO TABS
150.0000 mg | ORAL_TABLET | Freq: Once | ORAL | 1 refills | Status: AC
Start: 2017-11-26 — End: 2017-11-27

## 2017-11-26 MED ORDER — CITALOPRAM HYDROBROMIDE 40 MG PO TABS: 40.0000 mg | ORAL_TABLET | Freq: Every day | ORAL | 3 refills | Status: DC

## 2017-11-26 MED ORDER — CEPHALEXIN 500 MG PO CAPS: 500.0000 mg | ORAL_CAPSULE | Freq: Two times a day (BID) | ORAL | 0 refills | Status: DC

## 2017-11-26 MED ORDER — PREDNISOLONE 5 MG PO TABS: 5.0000 mg | ORAL_TABLET | Freq: Two times a day (BID) | ORAL | 1 refills | Status: DC

## 2017-11-26 MED ORDER — ALPRAZOLAM 0.5 MG PO TABS: 0.5000 mg | ORAL_TABLET | Freq: Two times a day (BID) | ORAL | 5 refills | Status: DC

## 2017-11-27 NOTE — Progress Notes (Signed)
BP 121/67   Pulse 76   Temp (!) 97.4 F (36.3 C) (Oral)   Ht 5\' 4"  (1.626 m)   Wt 176 lb (79.8 kg)   BMI 30.21 kg/m    Subjective:    Patient ID: Denise Underwood, female    DOB: 11/08/1945, 72 y.o.   MRN: [id number]  HPI: Denise FullerFrances S Degen is a 72 y.o. female presenting on 11/26/2017 for Psoriasis (1 week follow up)  One week recheck on her severe Rash.  She has had problems with psoriasis in the past and was having significant yeast infections under her breasts and under her abdomen.  She was not responding very well to topical econazole and clobetasol.  These were stopped and she was started with oral medications.  She has had a great response and is doing well.  Her dermatology appointment is not for many months because they did not have an appointment available soon.  We will continue with what we are doing and plan to see her back if needed.  Past Medical History:  Diagnosis Date  . Anxiety   . Arthritis   . Hyperlipidemia   . Hypertension   . Psoriasis    Relevant past medical, surgical, family and social history reviewed and updated as indicated. Interim medical history since our last visit reviewed. Allergies and medications reviewed and updated. DATA REVIEWED: CHART IN EPIC  Family History reviewed for pertinent findings.  Review of Systems  Constitutional: Negative.   HENT: Negative.   Eyes: Negative.   Respiratory: Negative.   Gastrointestinal: Negative.   Genitourinary: Negative.   Skin: Positive for color change and rash.    Allergies as of 11/26/2017   No Known Allergies     Medication List        Accurate as of 11/26/17 11:59 PM. Always use your most recent med list.          ALPRAZolam 0.5 MG tablet Commonly known as:  XANAX Take 1 tablet (0.5 mg total) by mouth 2 (two) times daily. For anxiety.   cephALEXin 500 MG capsule Commonly known as:  KEFLEX Take 1 capsule (500 mg total) by mouth 2 (two) times daily.   citalopram 40 MG  tablet Commonly known as:  CELEXA Take 1 tablet (40 mg total) by mouth daily. For depression   clobetasol cream 0.05 % Commonly known as:  TEMOVATE Apply 1 application topically 2 (two) times daily.   Clobetasol Prop Emollient Base 0.05 % emollient cream Commonly known as:  CLOBETASOL PROPIONATE E Apply 1 application topically 2 (two) times daily. Apply to ECZEMA areas   Diclofenac Sodium 3 % Gel Place 4 g onto the skin 4 (four) times daily as needed.   econazole nitrate 1 % cream Apply topically daily. Apply to YEAST areas   Fish Oil 1000 MG Caps Take by mouth.   fluconazole 150 MG tablet Commonly known as:  DIFLUCAN Take 1 tablet (150 mg total) by mouth once for 1 dose.   hydrochlorothiazide 25 MG tablet Commonly known as:  HYDRODIURIL Take 1-2 tablets (25-50 mg total) by mouth daily. For blood pressure and fluid   nicotine 14 mg/24hr patch Commonly known as:  NICODERM CQ - dosed in mg/24 hours Place 1 patch (14 mg total) onto the skin daily.   prednisoLONE 5 MG Tabs tablet Take 1 tablet (5 mg total) by mouth 2 (two) times daily.   rosuvastatin 20 MG tablet Commonly known as:  CRESTOR Take 1 tablet (20  mg total) by mouth daily.   traMADol 50 MG tablet Commonly known as:  ULTRAM Take 2 tablets (100 mg total) by mouth every 6 (six) hours as needed. For pain   Vitamin D3 1000 units Caps Take by mouth.          Objective:    BP 121/67   Pulse 76   Temp (!) 97.4 F (36.3 C) (Oral)   Ht 5\' 4"  (1.626 m)   Wt 176 lb (79.8 kg)   BMI 30.21 kg/m   No Known Allergies  Wt Readings from Last 3 Encounters:  11/26/17 176 lb (79.8 kg)  11/19/17 179 lb 12.8 oz (81.6 kg)  10/17/17 178 lb (80.7 kg)    Physical Exam  Constitutional: She is oriented to person, place, and time. She appears well-developed and well-nourished.  HENT:  Head: Normocephalic and atraumatic.  Eyes: Pupils are equal, round, and reactive to light. Conjunctivae and EOM are normal.   Cardiovascular: Normal rate, regular rhythm, normal heart sounds and intact distal pulses.  Pulmonary/Chest: Effort normal and breath sounds normal.  Abdominal: Soft. Bowel sounds are normal.  Neurological: She is alert and oriented to person, place, and time. She has normal reflexes.  Skin: Skin is warm and dry. No rash noted.  Improved areas of psoriasis on arms and legs.  Decreased size to yeast areas under the breast and abdomen.  Psychiatric: She has a normal mood and affect. Her behavior is normal. Judgment and thought content normal.    Results for orders placed or performed in visit on 07/02/17  CBC with Differential/Platelet  Result Value Ref Range   WBC 17.4 (H) 3.9 - 10.3 K/uL   RBC 3.97 3.70 - 5.45 MIL/uL   Hemoglobin 12.4 11.6 - 15.9 g/dL   HCT 16.1 09.6 - 04.5 %   MCV 96.7 79.5 - 101.0 fL   MCH 31.2 25.1 - 34.0 pg   MCHC 32.3 31.5 - 36.0 g/dL   RDW 40.9 81.1 - 91.4 %   Platelets 226 145 - 400 K/uL   Neutrophils Relative % 74 %   Neutro Abs 12.8 (H) 1.5 - 6.5 K/uL   Lymphocytes Relative 15 %   Lymphs Abs 2.6 0.9 - 3.3 K/uL   Monocytes Relative 8 %   Monocytes Absolute 1.4 (H) 0.1 - 0.9 K/uL   Eosinophils Relative 3 %   Eosinophils Absolute 0.6 (H) 0.0 - 0.5 K/uL   Basophils Relative 0 %   Basophils Absolute 0.0 0.0 - 0.1 K/uL  Sedimentation rate  Result Value Ref Range   Sed Rate 59 (H) 0 - 22 mm/hr  Rheumatoid factor  Result Value Ref Range   Rhuematoid fact SerPl-aCnc 11.0 0.0 - 13.9 IU/mL  ANA, IFA (with reflex)  Result Value Ref Range   ANA Ab, IFA Negative   Urinalysis, Complete w Microscopic  Result Value Ref Range   Color, Urine YELLOW YELLOW   APPearance HAZY (A) CLEAR   Specific Gravity, Urine 1.021 1.005 - 1.030   pH 5.0 5.0 - 8.0   Glucose, UA NEGATIVE NEGATIVE mg/dL   Hgb urine dipstick NEGATIVE NEGATIVE   Bilirubin Urine NEGATIVE NEGATIVE   Ketones, ur NEGATIVE NEGATIVE mg/dL   Protein, ur NEGATIVE NEGATIVE mg/dL   Nitrite NEGATIVE  NEGATIVE   Leukocytes, UA LARGE (A) NEGATIVE   RBC / HPF 0-5 0 - 5 RBC/hpf   WBC, UA TOO NUMEROUS TO COUNT 0 - 5 WBC/hpf   Bacteria, UA RARE (A) NONE SEEN  Squamous Epithelial / LPF 6-30 (A) NONE SEEN   Mucus PRESENT    Hyaline Casts, UA PRESENT       Assessment & Plan:   1. Tinea corporis - cephALEXin (KEFLEX) 500 MG capsule; Take 1 capsule (500 mg total) by mouth 2 (two) times daily.  Dispense: 20 capsule; Refill: 0  2. Psoriasis - prednisoLONE 5 MG TABS tablet; Take 1 tablet (5 mg total) by mouth 2 (two) times daily.  Dispense: 60 each; Refill: 1  3. GAD (generalized anxiety disorder) - ALPRAZolam (XANAX) 0.5 MG tablet; Take 1 tablet (0.5 mg total) by mouth 2 (two) times daily. For anxiety.  Dispense: 60 tablet; Refill: 5 - citalopram (CELEXA) 40 MG tablet; Take 1 tablet (40 mg total) by mouth daily. For depression  Dispense: 90 tablet; Refill: 3   Continue all other maintenance medications as listed above.  Follow up plan: No follow-ups on file.  Educational handout given for survey  Remus Loffler PA-C [facility] [address]  [place], [place] [address] [phone]   11/27/2017, 8:40 AM

## 2017-12-11 ENCOUNTER — Other Ambulatory Visit: Payer: Self-pay | Admitting: Physician Assistant

## 2017-12-11 DIAGNOSIS — M159 Polyosteoarthritis, unspecified: Secondary | ICD-10-CM

## 2017-12-11 DIAGNOSIS — M150 Primary generalized (osteo)arthritis: Principal | ICD-10-CM

## 2017-12-12 MED ORDER — TRAMADOL HCL 50 MG PO TABS: 100.0000 mg | ORAL_TABLET | Freq: Four times a day (QID) | ORAL | 5 refills | Status: DC | PRN

## 2018-02-11 ENCOUNTER — Other Ambulatory Visit: Payer: Self-pay | Admitting: Physician Assistant

## 2018-02-11 DIAGNOSIS — L409 Psoriasis, unspecified: Secondary | ICD-10-CM

## 2018-02-17 ENCOUNTER — Ambulatory Visit (INDEPENDENT_AMBULATORY_CARE_PROVIDER_SITE_OTHER): Payer: Medicare HMO | Admitting: Physician Assistant

## 2018-02-17 ENCOUNTER — Encounter: Payer: Self-pay | Admitting: Physician Assistant

## 2018-02-17 VITALS — BP 106/73 | HR 93 | Temp 97.7°F | Ht 64.0 in | Wt 181.4 lb

## 2018-02-17 DIAGNOSIS — I10 Essential (primary) hypertension: Secondary | ICD-10-CM

## 2018-02-17 DIAGNOSIS — B354 Tinea corporis: Secondary | ICD-10-CM

## 2018-02-17 DIAGNOSIS — Z23 Encounter for immunization: Secondary | ICD-10-CM

## 2018-02-17 MED ORDER — CEPHALEXIN 500 MG PO CAPS: 500.0000 mg | ORAL_CAPSULE | Freq: Two times a day (BID) | ORAL | 5 refills | Status: DC

## 2018-02-18 NOTE — Progress Notes (Signed)
BP 106/73   Pulse 93   Temp 97.7 F (36.5 C) (Oral)   Ht 5\' 4"  (1.626 m)   Wt 181 lb 6.4 oz (82.3 kg)   BMI 31.14 kg/m    Subjective:    Patient ID: Denise Underwood, female    DOB: 1946/05/13, 72 y.o.   MRN: [id number]  HPI: Denise Underwood is a 72 y.o. female presenting on 02/17/2018 for Hypertension (4 month ) and Hyperlipidemia  This patient comes in for chronic recheck on her medical conditions.  She reports that she is doing very well overall and not having any difficulty at this time.  Her past medical history is positive for hyperlipidemia, hypertension, severe psoriasis.  She has been very well controlled with the medications that we have now.  She also reports that her depression is doing very well however she did lose a brother since she was in our office last.  Depression screen [facility] 2/9 02/17/2018 11/26/2017 11/19/2017 10/17/2017 10/09/2017  Decreased Interest 0 0 0 0 0  Down, Depressed, Hopeless 0 0 0 0 0  PHQ - 2 Score 0 0 0 0 0      Past Medical History:  Diagnosis Date  . Anxiety   . Arthritis   . Hyperlipidemia   . Hypertension   . Psoriasis    Relevant past medical, surgical, family and social history reviewed and updated as indicated. Interim medical history since our last visit reviewed. Allergies and medications reviewed and updated. DATA REVIEWED: CHART IN EPIC  Family History reviewed for pertinent findings.  Review of Systems  Constitutional: Negative.  Negative for activity change, fatigue and fever.  HENT: Negative.   Eyes: Negative.   Respiratory: Negative.  Negative for cough.   Cardiovascular: Negative.  Negative for chest pain.  Gastrointestinal: Negative.  Negative for abdominal pain.  Endocrine: Negative.   Genitourinary: Negative.  Negative for dysuria.  Musculoskeletal: Negative.   Skin: Positive for color change and rash.  Neurological: Negative.     Allergies as of 02/17/2018   No Known Allergies     Medication List        Accurate as of 02/17/18 11:59 PM. Always use your most recent med list.          ALPRAZolam 0.5 MG tablet Commonly known as:  XANAX Take 1 tablet (0.5 mg total) by mouth 2 (two) times daily. For anxiety.   cephALEXin 500 MG capsule Commonly known as:  KEFLEX Take 1 capsule (500 mg total) by mouth 2 (two) times daily.   citalopram 40 MG tablet Commonly known as:  CELEXA Take 1 tablet (40 mg total) by mouth daily. For depression   clobetasol cream 0.05 % Commonly known as:  TEMOVATE Apply 1 application topically 2 (two) times daily.   Clobetasol Prop Emollient Base 0.05 % emollient cream Apply 1 application topically 2 (two) times daily. Apply to ECZEMA areas   Diclofenac Sodium 3 % Gel Place 4 g onto the skin 4 (four) times daily as needed.   econazole nitrate 1 % cream Apply topically daily. Apply to YEAST areas   Fish Oil 1000 MG Caps Take by mouth.   hydrochlorothiazide 25 MG tablet Commonly known as:  HYDRODIURIL Take 1-2 tablets (25-50 mg total) by mouth daily. For blood pressure and fluid   prednisoLONE 5 MG Tabs tablet Take 1 tablet (5 mg total) by mouth 2 (two) times daily.   rosuvastatin 20 MG tablet Commonly known as:  CRESTOR Take 1  tablet (20 mg total) by mouth daily.   traMADol 50 MG tablet Commonly known as:  ULTRAM Take 2 tablets (100 mg total) by mouth every 6 (six) hours as needed. For pain   Vitamin D3 1000 units Caps Take by mouth.          Objective:    BP 106/73   Pulse 93   Temp 97.7 F (36.5 C) (Oral)   Ht 5\' 4"  (1.626 m)   Wt 181 lb 6.4 oz (82.3 kg)   BMI 31.14 kg/m   No Known Allergies  Wt Readings from Last 3 Encounters:  02/17/18 181 lb 6.4 oz (82.3 kg)  11/26/17 176 lb (79.8 kg)  11/19/17 179 lb 12.8 oz (81.6 kg)    Physical Exam  Constitutional: She is oriented to person, place, and time. She appears well-developed and well-nourished.  HENT:  Head: Normocephalic and atraumatic.  Eyes: Pupils are equal, round, and  reactive to light. Conjunctivae and EOM are normal.  Cardiovascular: Normal rate, regular rhythm, normal heart sounds and intact distal pulses.  Pulmonary/Chest: Effort normal and breath sounds normal.  Abdominal: Soft. Bowel sounds are normal.  Neurological: She is alert and oriented to person, place, and time. She has normal reflexes.  Skin: Skin is warm and dry. Rash noted. Rash is macular. There is erythema.  Psychiatric: She has a normal mood and affect. Her behavior is normal. Judgment and thought content normal.    Results for orders placed or performed in visit on 07/02/17  CBC with Differential/Platelet  Result Value Ref Range   WBC 17.4 (H) 3.9 - 10.3 K/uL   RBC 3.97 3.70 - 5.45 MIL/uL   Hemoglobin 12.4 11.6 - 15.9 g/dL   HCT 62.1 30.8 - 65.7 %   MCV 96.7 79.5 - 101.0 fL   MCH 31.2 25.1 - 34.0 pg   MCHC 32.3 31.5 - 36.0 g/dL   RDW 84.6 96.2 - 95.2 %   Platelets 226 145 - 400 K/uL   Neutrophils Relative % 74 %   Neutro Abs 12.8 (H) 1.5 - 6.5 K/uL   Lymphocytes Relative 15 %   Lymphs Abs 2.6 0.9 - 3.3 K/uL   Monocytes Relative 8 %   Monocytes Absolute 1.4 (H) 0.1 - 0.9 K/uL   Eosinophils Relative 3 %   Eosinophils Absolute 0.6 (H) 0.0 - 0.5 K/uL   Basophils Relative 0 %   Basophils Absolute 0.0 0.0 - 0.1 K/uL  Sedimentation rate  Result Value Ref Range   Sed Rate 59 (H) 0 - 22 mm/hr  Rheumatoid factor  Result Value Ref Range   Rhuematoid fact SerPl-aCnc 11.0 0.0 - 13.9 IU/mL  ANA, IFA (with reflex)  Result Value Ref Range   ANA Ab, IFA Negative   Urinalysis, Complete w Microscopic  Result Value Ref Range   Color, Urine YELLOW YELLOW   APPearance HAZY (A) CLEAR   Specific Gravity, Urine 1.021 1.005 - 1.030   pH 5.0 5.0 - 8.0   Glucose, UA NEGATIVE NEGATIVE mg/dL   Hgb urine dipstick NEGATIVE NEGATIVE   Bilirubin Urine NEGATIVE NEGATIVE   Ketones, ur NEGATIVE NEGATIVE mg/dL   Protein, ur NEGATIVE NEGATIVE mg/dL   Nitrite NEGATIVE NEGATIVE   Leukocytes, UA  LARGE (A) NEGATIVE   RBC / HPF 0-5 0 - 5 RBC/hpf   WBC, UA TOO NUMEROUS TO COUNT 0 - 5 WBC/hpf   Bacteria, UA RARE (A) NONE SEEN   Squamous Epithelial / LPF 6-30 (A) NONE SEEN  Mucus PRESENT    Hyaline Casts, UA PRESENT       Assessment & Plan:   1. Tinea corporis - cephALEXin (KEFLEX) 500 MG capsule; Take 1 capsule (500 mg total) by mouth 2 (two) times daily.  Dispense: 20 capsule; Refill: 5  2. Encounter for immunization - Flu vaccine HIGH DOSE PF  3. Essential hypertension continue medications    Continue all other maintenance medications as listed above.  Follow up plan: Return in about 3 months (around 05/20/2018).  Educational handout given for survey  Remus Loffler PA-C [facility] [address]  [place], [place] [address] [phone]   02/18/2018, 12:48 PM

## 2018-03-29 ENCOUNTER — Emergency Department (HOSPITAL_COMMUNITY): Payer: Medicare HMO

## 2018-03-29 ENCOUNTER — Other Ambulatory Visit: Payer: Self-pay

## 2018-03-29 ENCOUNTER — Inpatient Hospital Stay (HOSPITAL_COMMUNITY)
Admission: EM | Admit: 2018-03-29 | Discharge: 2018-04-03 | DRG: 603 | Disposition: A | Discharge status: Skilled Nursing Facility | Payer: Medicare HMO | Attending: Family Medicine | Admitting: Family Medicine

## 2018-03-29 ENCOUNTER — Encounter (HOSPITAL_COMMUNITY): Payer: Self-pay | Admitting: Emergency Medicine

## 2018-03-29 DIAGNOSIS — D72820 Lymphocytosis (symptomatic): Secondary | ICD-10-CM | POA: Diagnosis not present

## 2018-03-29 DIAGNOSIS — Y92009 Unspecified place in unspecified non-institutional (private) residence as the place of occurrence of the external cause: Secondary | ICD-10-CM

## 2018-03-29 DIAGNOSIS — E860 Dehydration: Secondary | ICD-10-CM | POA: Diagnosis not present

## 2018-03-29 DIAGNOSIS — F17200 Nicotine dependence, unspecified, uncomplicated: Secondary | ICD-10-CM | POA: Diagnosis present

## 2018-03-29 DIAGNOSIS — D72828 Other elevated white blood cell count: Secondary | ICD-10-CM | POA: Diagnosis present

## 2018-03-29 DIAGNOSIS — M25551 Pain in right hip: Secondary | ICD-10-CM | POA: Diagnosis not present

## 2018-03-29 DIAGNOSIS — D72829 Elevated white blood cell count, unspecified: Secondary | ICD-10-CM | POA: Diagnosis present

## 2018-03-29 DIAGNOSIS — E785 Hyperlipidemia, unspecified: Secondary | ICD-10-CM | POA: Diagnosis present

## 2018-03-29 DIAGNOSIS — B354 Tinea corporis: Secondary | ICD-10-CM | POA: Diagnosis not present

## 2018-03-29 DIAGNOSIS — J449 Chronic obstructive pulmonary disease, unspecified: Secondary | ICD-10-CM | POA: Diagnosis not present

## 2018-03-29 DIAGNOSIS — B372 Candidiasis of skin and nail: Secondary | ICD-10-CM

## 2018-03-29 DIAGNOSIS — Z792 Long term (current) use of antibiotics: Secondary | ICD-10-CM

## 2018-03-29 DIAGNOSIS — M79673 Pain in unspecified foot: Secondary | ICD-10-CM

## 2018-03-29 DIAGNOSIS — R402363 Coma scale, best motor response, obeys commands, at hospital admission: Secondary | ICD-10-CM | POA: Diagnosis present

## 2018-03-29 DIAGNOSIS — S93401A Sprain of unspecified ligament of right ankle, initial encounter: Secondary | ICD-10-CM | POA: Diagnosis present

## 2018-03-29 DIAGNOSIS — L03314 Cellulitis of groin: Secondary | ICD-10-CM | POA: Diagnosis not present

## 2018-03-29 DIAGNOSIS — G9389 Other specified disorders of brain: Secondary | ICD-10-CM | POA: Diagnosis not present

## 2018-03-29 DIAGNOSIS — M159 Polyosteoarthritis, unspecified: Secondary | ICD-10-CM

## 2018-03-29 DIAGNOSIS — R402143 Coma scale, eyes open, spontaneous, at hospital admission: Secondary | ICD-10-CM | POA: Diagnosis present

## 2018-03-29 DIAGNOSIS — F411 Generalized anxiety disorder: Secondary | ICD-10-CM

## 2018-03-29 DIAGNOSIS — Z741 Need for assistance with personal care: Secondary | ICD-10-CM | POA: Diagnosis not present

## 2018-03-29 DIAGNOSIS — L0390 Cellulitis, unspecified: Secondary | ICD-10-CM | POA: Diagnosis not present

## 2018-03-29 DIAGNOSIS — Z79899 Other long term (current) drug therapy: Secondary | ICD-10-CM

## 2018-03-29 DIAGNOSIS — M1990 Unspecified osteoarthritis, unspecified site: Secondary | ICD-10-CM | POA: Diagnosis present

## 2018-03-29 DIAGNOSIS — M25552 Pain in left hip: Secondary | ICD-10-CM | POA: Diagnosis not present

## 2018-03-29 DIAGNOSIS — L304 Erythema intertrigo: Secondary | ICD-10-CM | POA: Diagnosis present

## 2018-03-29 DIAGNOSIS — L03115 Cellulitis of right lower limb: Principal | ICD-10-CM

## 2018-03-29 DIAGNOSIS — W19XXXD Unspecified fall, subsequent encounter: Secondary | ICD-10-CM | POA: Diagnosis not present

## 2018-03-29 DIAGNOSIS — R296 Repeated falls: Secondary | ICD-10-CM | POA: Diagnosis present

## 2018-03-29 DIAGNOSIS — F419 Anxiety disorder, unspecified: Secondary | ICD-10-CM | POA: Diagnosis present

## 2018-03-29 DIAGNOSIS — L409 Psoriasis, unspecified: Secondary | ICD-10-CM | POA: Diagnosis not present

## 2018-03-29 DIAGNOSIS — R402253 Coma scale, best verbal response, oriented, at hospital admission: Secondary | ICD-10-CM | POA: Diagnosis present

## 2018-03-29 DIAGNOSIS — R600 Localized edema: Secondary | ICD-10-CM | POA: Diagnosis not present

## 2018-03-29 DIAGNOSIS — I10 Essential (primary) hypertension: Secondary | ICD-10-CM | POA: Diagnosis not present

## 2018-03-29 DIAGNOSIS — Z7952 Long term (current) use of systemic steroids: Secondary | ICD-10-CM | POA: Diagnosis not present

## 2018-03-29 DIAGNOSIS — R262 Difficulty in walking, not elsewhere classified: Secondary | ICD-10-CM | POA: Diagnosis not present

## 2018-03-29 DIAGNOSIS — M150 Primary generalized (osteo)arthritis: Secondary | ICD-10-CM

## 2018-03-29 DIAGNOSIS — E876 Hypokalemia: Secondary | ICD-10-CM

## 2018-03-29 DIAGNOSIS — Z751 Person awaiting admission to adequate facility elsewhere: Secondary | ICD-10-CM

## 2018-03-29 DIAGNOSIS — R531 Weakness: Secondary | ICD-10-CM | POA: Diagnosis not present

## 2018-03-29 DIAGNOSIS — W19XXXA Unspecified fall, initial encounter: Secondary | ICD-10-CM | POA: Diagnosis present

## 2018-03-29 DIAGNOSIS — R0902 Hypoxemia: Secondary | ICD-10-CM | POA: Diagnosis present

## 2018-03-29 DIAGNOSIS — M6281 Muscle weakness (generalized): Secondary | ICD-10-CM | POA: Diagnosis not present

## 2018-03-29 LAB — URINALYSIS, ROUTINE W REFLEX MICROSCOPIC
BACTERIA UA: NONE SEEN
Bilirubin Urine: NEGATIVE
GLUCOSE, UA: NEGATIVE mg/dL
Ketones, ur: NEGATIVE mg/dL
LEUKOCYTES UA: NEGATIVE
NITRITE: NEGATIVE
PH: 6 (ref 5.0–8.0)
Protein, ur: 100 mg/dL — AB
SPECIFIC GRAVITY, URINE: 1.018 (ref 1.005–1.030)

## 2018-03-29 LAB — COMPREHENSIVE METABOLIC PANEL
ALT: 10 U/L (ref 0–44)
ANION GAP: 10 (ref 5–15)
AST: 19 U/L (ref 15–41)
Albumin: 3 g/dL — ABNORMAL LOW (ref 3.5–5.0)
Alkaline Phosphatase: 48 U/L (ref 38–126)
BUN: 16 mg/dL (ref 8–23)
CALCIUM: 9.1 mg/dL (ref 8.9–10.3)
CO2: 35 mmol/L — AB (ref 22–32)
Chloride: 91 mmol/L — ABNORMAL LOW (ref 98–111)
Creatinine, Ser: 1.23 mg/dL — ABNORMAL HIGH (ref 0.44–1.00)
GFR calc non Af Amer: 42 mL/min — ABNORMAL LOW (ref >60)
GFR, EST AFRICAN AMERICAN: 49 mL/min — AB (ref >60)
Glucose, Bld: 115 mg/dL — ABNORMAL HIGH (ref 70–99)
Potassium: 2.9 mmol/L — ABNORMAL LOW (ref 3.5–5.1)
SODIUM: 136 mmol/L (ref 135–145)
TOTAL PROTEIN: 7.1 g/dL (ref 6.5–8.1)
Total Bilirubin: 1.1 mg/dL (ref 0.3–1.2)

## 2018-03-29 LAB — CBC WITH DIFFERENTIAL/PLATELET
Abs Immature Granulocytes: 0.17 10*3/uL — ABNORMAL HIGH (ref 0.00–0.07)
BASOS PCT: 0 %
Basophils Absolute: 0.1 10*3/uL (ref 0.0–0.1)
EOS ABS: 0.3 10*3/uL (ref 0.0–0.5)
EOS PCT: 2 %
HCT: 40 % (ref 36.0–46.0)
HEMOGLOBIN: 12.7 g/dL (ref 12.0–15.0)
Immature Granulocytes: 1 %
LYMPHS PCT: 13 %
Lymphs Abs: 2.7 10*3/uL (ref 0.7–4.0)
MCH: 32.4 pg (ref 26.0–34.0)
MCHC: 31.8 g/dL (ref 30.0–36.0)
MCV: 102 fL — ABNORMAL HIGH (ref 80.0–100.0)
MONO ABS: 1.8 10*3/uL — AB (ref 0.1–1.0)
Monocytes Relative: 9 %
Neutro Abs: 15.4 10*3/uL — ABNORMAL HIGH (ref 1.7–7.7)
Neutrophils Relative %: 75 %
Platelets: 340 10*3/uL (ref 150–400)
RBC: 3.92 MIL/uL (ref 3.87–5.11)
RDW: 13.2 % (ref 11.5–15.5)
WBC: 20.5 10*3/uL — ABNORMAL HIGH (ref 4.0–10.5)
nRBC: 0 % (ref 0.0–0.2)

## 2018-03-29 LAB — MAGNESIUM: Magnesium: 1.2 mg/dL — ABNORMAL LOW (ref 1.7–2.4)

## 2018-03-29 LAB — SEDIMENTATION RATE: Sed Rate: 68 mm/hr — ABNORMAL HIGH (ref 0–22)

## 2018-03-29 MED ORDER — TRAMADOL HCL 50 MG PO TABS
50.0000 mg | ORAL_TABLET | Freq: Four times a day (QID) | ORAL | Status: DC | PRN
  Administered 2018-03-29 – 2018-04-02 (×2): 50 mg via ORAL
  Filled 2018-03-29 (×3): qty 1

## 2018-03-29 MED ORDER — SODIUM CHLORIDE 0.9 % IV SOLN
INTRAVENOUS | Status: DC
  Administered 2018-03-29 – 2018-04-02 (×6): via INTRAVENOUS

## 2018-03-29 MED ORDER — HYDRALAZINE HCL 20 MG/ML IJ SOLN: 10.0000 mg | Freq: Four times a day (QID) | INTRAMUSCULAR | Status: DC | PRN

## 2018-03-29 MED ORDER — SODIUM CHLORIDE 0.9 % IV SOLN
INTRAVENOUS | Status: DC | PRN
  Administered 2018-03-29: 20:00:00 via INTRAVENOUS

## 2018-03-29 MED ORDER — TRAZODONE HCL 50 MG PO TABS
50.0000 mg | ORAL_TABLET | Freq: Every evening | ORAL | Status: DC | PRN
  Administered 2018-04-03: 50 mg via ORAL
  Filled 2018-03-29: qty 1

## 2018-03-29 MED ORDER — POLYETHYLENE GLYCOL 3350 17 G PO PACK: 17.0000 g | PACK | Freq: Every day | ORAL | Status: DC | PRN

## 2018-03-29 MED ORDER — ACETAMINOPHEN 650 MG RE SUPP: 650.0000 mg | Freq: Four times a day (QID) | RECTAL | Status: DC | PRN

## 2018-03-29 MED ORDER — ONDANSETRON HCL 4 MG/2ML IJ SOLN: 4.0000 mg | Freq: Four times a day (QID) | INTRAMUSCULAR | Status: DC | PRN

## 2018-03-29 MED ORDER — CLOBETASOL PROPIONATE 0.05 % EX CREA
1.0000 "application " | TOPICAL_CREAM | Freq: Two times a day (BID) | CUTANEOUS | Status: DC
  Filled 2018-03-29: qty 15

## 2018-03-29 MED ORDER — ROSUVASTATIN CALCIUM 20 MG PO TABS
20.0000 mg | ORAL_TABLET | Freq: Every day | ORAL | Status: DC
  Administered 2018-03-30 – 2018-04-02 (×4): 20 mg via ORAL
  Filled 2018-03-29 (×5): qty 1

## 2018-03-29 MED ORDER — OXYCODONE HCL 5 MG PO TABS: 5.0000 mg | ORAL_TABLET | ORAL | Status: DC | PRN

## 2018-03-29 MED ORDER — VANCOMYCIN HCL IN DEXTROSE 1-5 GM/200ML-% IV SOLN
1000.0000 mg | Freq: Once | INTRAVENOUS | Status: AC
  Administered 2018-03-29: 1000 mg via INTRAVENOUS
  Filled 2018-03-29: qty 200

## 2018-03-29 MED ORDER — ACETAMINOPHEN 325 MG PO TABS
650.0000 mg | ORAL_TABLET | Freq: Four times a day (QID) | ORAL | Status: DC | PRN
  Administered 2018-04-01: 650 mg via ORAL
  Filled 2018-03-29: qty 2

## 2018-03-29 MED ORDER — FLUCONAZOLE 150 MG PO TABS: 150.0000 mg | ORAL_TABLET | Freq: Every day | ORAL | Status: DC

## 2018-03-29 MED ORDER — SODIUM CHLORIDE 0.9 % IV SOLN: 250.0000 mL | INTRAVENOUS | Status: DC | PRN

## 2018-03-29 MED ORDER — NYSTATIN-TRIAMCINOLONE 100000-0.1 UNIT/GM-% EX OINT
TOPICAL_OINTMENT | Freq: Two times a day (BID) | CUTANEOUS | Status: DC
  Filled 2018-03-29: qty 15

## 2018-03-29 MED ORDER — CITALOPRAM HYDROBROMIDE 20 MG PO TABS
20.0000 mg | ORAL_TABLET | Freq: Every day | ORAL | Status: DC
  Administered 2018-03-30 – 2018-04-03 (×5): 20 mg via ORAL
  Filled 2018-03-29 (×5): qty 1

## 2018-03-29 MED ORDER — FLUCONAZOLE IN SODIUM CHLORIDE 200-0.9 MG/100ML-% IV SOLN
200.0000 mg | INTRAVENOUS | Status: DC
  Administered 2018-03-29: 200 mg via INTRAVENOUS
  Filled 2018-03-29 (×3): qty 100

## 2018-03-29 MED ORDER — HEPARIN SODIUM (PORCINE) 5000 UNIT/ML IJ SOLN
5000.0000 [IU] | Freq: Three times a day (TID) | INTRAMUSCULAR | Status: DC
  Administered 2018-03-29 – 2018-04-03 (×14): 5000 [IU] via SUBCUTANEOUS
  Filled 2018-03-29 (×14): qty 1

## 2018-03-29 MED ORDER — SODIUM CHLORIDE 0.9% FLUSH: 3.0000 mL | INTRAVENOUS | Status: DC | PRN

## 2018-03-29 MED ORDER — IPRATROPIUM-ALBUTEROL 0.5-2.5 (3) MG/3ML IN SOLN
3.0000 mL | Freq: Three times a day (TID) | RESPIRATORY_TRACT | Status: DC
  Administered 2018-03-29 – 2018-03-31 (×6): 3 mL via RESPIRATORY_TRACT
  Filled 2018-03-29 (×6): qty 3

## 2018-03-29 MED ORDER — POTASSIUM CHLORIDE CRYS ER 20 MEQ PO TBCR
40.0000 meq | EXTENDED_RELEASE_TABLET | Freq: Once | ORAL | Status: AC
  Administered 2018-03-30: 40 meq via ORAL
  Filled 2018-03-29: qty 2

## 2018-03-29 MED ORDER — POTASSIUM CHLORIDE CRYS ER 20 MEQ PO TBCR
40.0000 meq | EXTENDED_RELEASE_TABLET | Freq: Once | ORAL | Status: AC
  Administered 2018-03-29: 40 meq via ORAL
  Filled 2018-03-29: qty 2

## 2018-03-29 MED ORDER — ALBUTEROL SULFATE (2.5 MG/3ML) 0.083% IN NEBU: 2.5000 mg | INHALATION_SOLUTION | RESPIRATORY_TRACT | Status: DC | PRN

## 2018-03-29 MED ORDER — ONDANSETRON HCL 4 MG PO TABS: 4.0000 mg | ORAL_TABLET | Freq: Four times a day (QID) | ORAL | Status: DC | PRN

## 2018-03-29 MED ORDER — SODIUM CHLORIDE 0.9% FLUSH
3.0000 mL | Freq: Two times a day (BID) | INTRAVENOUS | Status: DC
  Administered 2018-03-29 – 2018-04-03 (×9): 3 mL via INTRAVENOUS

## 2018-03-29 MED ORDER — OXYCODONE HCL 5 MG PO TABS
5.0000 mg | ORAL_TABLET | ORAL | Status: DC | PRN
  Administered 2018-03-30 – 2018-04-03 (×5): 5 mg via ORAL
  Filled 2018-03-29 (×5): qty 1

## 2018-03-29 MED ORDER — SODIUM CHLORIDE 0.9 % IV SOLN
1.0000 g | INTRAVENOUS | Status: DC
  Administered 2018-03-30 – 2018-04-02 (×5): 1 g via INTRAVENOUS
  Filled 2018-03-29 (×3): qty 10
  Filled 2018-03-29: qty 1
  Filled 2018-03-29: qty 10
  Filled 2018-03-29 (×2): qty 1
  Filled 2018-03-29 (×2): qty 10

## 2018-03-29 MED ORDER — MAGNESIUM SULFATE 2 GM/50ML IV SOLN
2.0000 g | Freq: Once | INTRAVENOUS | Status: AC
  Administered 2018-03-29: 2 g via INTRAVENOUS
  Filled 2018-03-29: qty 50

## 2018-03-29 MED ORDER — SODIUM CHLORIDE 0.9 % IV BOLUS
500.0000 mL | Freq: Once | INTRAVENOUS | Status: AC
  Administered 2018-03-29: 500 mL via INTRAVENOUS

## 2018-03-29 MED ORDER — ALPRAZOLAM 0.5 MG PO TABS
0.5000 mg | ORAL_TABLET | Freq: Two times a day (BID) | ORAL | Status: DC
  Administered 2018-03-29 – 2018-03-30 (×2): 0.5 mg via ORAL
  Filled 2018-03-29 (×2): qty 1

## 2018-03-29 NOTE — ED Triage Notes (Signed)
Per EMS patient from home. Patient complains of weakness x 3 days. Family states patient has frequent UTIs.

## 2018-03-29 NOTE — ED Notes (Signed)
Denise Underwood daughter [phone] contact in case of an emergency.

## 2018-03-29 NOTE — ED Notes (Signed)
Patient unable to ambulate at this time

## 2018-03-29 NOTE — ED Notes (Addendum)
Patient currently states she is not taking prednisone at this time. Family states that she was taking the medication prior but not at this time, due to a prior rash.

## 2018-03-29 NOTE — ED Provider Notes (Signed)
[facility] EMERGENCY DEPARTMENT Provider Note   CSN: [id number] Arrival date & time: 03/29/18  1423     History   Chief Complaint Chief Complaint  Patient presents with  . Weakness    HPI JAYLENE ARROWOOD is a 72 y.o. female.  HPI Patient lives alone.  Ambulates with walker at baseline.  Has had increasing generalized weakness over the last few days with multiple falls.  Having difficulty getting up out of her chair.  Denies any recent head injury or loss of consciousness.  She has had minimal cough and complains of urinary frequency.  She also complains of bilateral hip pain worse after her falls. Past Medical History:  Diagnosis Date  . Anxiety   . Arthritis   . Hyperlipidemia   . Hypertension   . Psoriasis     Patient Active Problem List   Diagnosis Date Noted  . Tinea corporis 11/19/2017  . Primary osteoarthritis of both knees 09/05/2017  . Leukocytosis (leucocytosis) 05/26/2017  . Hypertension 04/28/2017  . Psoriasis 04/28/2017  . Hyperlipidemia 04/28/2017  . GAD (generalized anxiety disorder) 04/28/2017    Past Surgical History:  Procedure Laterality Date  . ABDOMINAL HYSTERECTOMY    . CHOLECYSTECTOMY    . HERNIA REPAIR    . KNEE SURGERY    . TUBAL LIGATION       OB History   None      Home Medications    Prior to Admission medications   Medication Sig Start Date End Date Taking? Authorizing Provider  ALPRAZolam Prudy Feeler) 0.5 MG tablet Take 1 tablet (0.5 mg total) by mouth 2 (two) times daily. For anxiety. 11/26/17  Yes Remus Loffler, PA-C  cephALEXin (KEFLEX) 500 MG capsule Take 1 capsule (500 mg total) by mouth 2 (two) times daily. 02/17/18  Yes Remus Loffler, PA-C  Cholecalciferol (VITAMIN D3) 1000 units CAPS Take by mouth.   Yes [provider]  citalopram (CELEXA) 20 MG tablet Take 20 mg by mouth daily. For depression   Yes [provider]  Clobetasol Prop Emollient Base (CLOBETASOL PROPIONATE E) 0.05 % emollient cream Apply 1  application topically 2 (two) times daily. Apply to ECZEMA areas 09/05/17  Yes Remus Loffler, PA-C  Diclofenac Sodium 3 % GEL Place 4 g onto the skin 4 (four) times daily as needed. 10/17/17  Yes Remus Loffler, PA-C  econazole nitrate 1 % cream Apply topically daily. Apply to YEAST areas 09/05/17  Yes Remus Loffler, PA-C  fluconazole (DIFLUCAN) 150 MG tablet Take 1 tablet by mouth daily. 03/16/18  Yes [provider]  hydrochlorothiazide (HYDRODIURIL) 25 MG tablet Take 1-2 tablets (25-50 mg total) by mouth daily. For blood pressure and fluid 09/05/17  Yes Remus Loffler, PA-C  Omega-3 Fatty Acids (FISH OIL) 1000 MG CAPS Take by mouth.   Yes [provider]  rosuvastatin (CRESTOR) 20 MG tablet Take 1 tablet (20 mg total) by mouth daily. 05/30/17  Yes Remus Loffler, PA-C  traMADol (ULTRAM) 50 MG tablet Take 2 tablets (100 mg total) by mouth every 6 (six) hours as needed. For pain 12/12/17  Yes Remus Loffler, PA-C  citalopram (CELEXA) 40 MG tablet Take 1 tablet (40 mg total) by mouth daily. For depression 11/26/17   Remus Loffler, PA-C  prednisoLONE 5 MG TABS tablet Take 1 tablet (5 mg total) by mouth 2 (two) times daily. 11/26/17   Remus Loffler, PA-C    Family History No family history on file.  Social History Social History   Tobacco Use  . Smoking status: Current Every Day Smoker  . Smokeless tobacco: Never Used  Substance Use Topics  . Alcohol use: No    Frequency: Never  . Drug use: No     Allergies   Patient has no known allergies.   Review of Systems Review of Systems  Constitutional: Positive for fatigue. Negative for chills and fever.  HENT: Negative for sore throat and trouble swallowing.   Eyes: Negative for visual disturbance.  Respiratory: Positive for cough. Negative for shortness of breath.   Cardiovascular: Negative for chest pain, palpitations and leg swelling.  Gastrointestinal: Negative for abdominal pain, constipation, diarrhea, nausea and  vomiting.  Genitourinary: Positive for frequency. Negative for dysuria, flank pain and hematuria.  Musculoskeletal: Positive for arthralgias. Negative for back pain, myalgias and neck pain.  Skin: Positive for rash. Negative for wound.  Neurological: Positive for weakness. Negative for dizziness, light-headedness, numbness and headaches.  All other systems reviewed and are negative.    Physical Exam Updated Vital Signs BP 119/66 (BP Location: Left Arm)   Pulse 94   Temp 99.4 F (37.4 C) (Oral)   Resp (!) 23   Wt 82.3 kg   SpO2 97%   BMI 31.14 kg/m   Physical Exam  Constitutional: She is oriented to person, place, and time. She appears well-developed and well-nourished. No distress.  HENT:  Head: Normocephalic and atraumatic.  Mouth/Throat: Oropharynx is clear and moist.  Dry mucous membranes.  No evidence of scalp trauma.  No intraoral trauma.  Eyes: Pupils are equal, round, and reactive to light. EOM are normal.  Neck: Normal range of motion. Neck supple.  No posterior midline cervical tenderness to palpation.  No meningismus.  Cardiovascular: Normal rate and regular rhythm. Exam reveals no gallop and no friction rub.  No murmur heard. Pulmonary/Chest: Effort normal. She has rales.  Few scattered rhonchi.  No respiratory distress.  Abdominal: Soft. Bowel sounds are normal. There is no tenderness. There is no rebound and no guarding.  Musculoskeletal: Normal range of motion. She exhibits no edema or tenderness.  Patient with bilateral pain with range of motion of the hips.  No obvious extremity shortening or rotation.  Distal pulses intact.  No calf swelling, tenderness or asymmetry.  Neurological: She is alert and oriented to person, place, and time.  4/5 bilateral upper and lower extremity strength.  Sensation to light touch intact.  Skin: Skin is warm and dry. Capillary refill takes less than 2 seconds. No rash noted. She is not diaphoretic. There is erythema.  Patient has  intertriginous rash to the bilateral inguinal folds.  Beefy red well-demarcated rash with satellite lesions.  Area of erythema just proximal to the right ankle.  Roughly 15 x 10 cm.  No fluctuance.  Warm to palpation.  Psychiatric: She has a normal mood and affect. Her behavior is normal.  Nursing note and vitals reviewed.    ED Treatments / Results  Labs (all labs ordered are listed, but only abnormal results are displayed) Labs Reviewed  CBC WITH DIFFERENTIAL/PLATELET - Abnormal; Notable for the following components:      Result Value   WBC 20.5 (*)    MCV 102.0 (*)    Neutro Abs 15.4 (*)    Monocytes Absolute 1.8 (*)    Abs Immature Granulocytes 0.17 (*)    All other components within normal limits  COMPREHENSIVE METABOLIC PANEL - Abnormal; Notable for the following components:   Potassium 2.9 (*)  Chloride 91 (*)    CO2 35 (*)    Glucose, Bld 115 (*)    Creatinine, Ser 1.23 (*)    Albumin 3.0 (*)    GFR calc non Af Amer 42 (*)    GFR calc Af Amer 49 (*)    All other components within normal limits  URINALYSIS, ROUTINE W REFLEX MICROSCOPIC - Abnormal; Notable for the following components:   APPearance HAZY (*)    Hgb urine dipstick MODERATE (*)    Protein, ur 100 (*)    All other components within normal limits  CULTURE, BLOOD (ROUTINE X 2)  CULTURE, BLOOD (ROUTINE X 2)  SEDIMENTATION RATE    EKG None  Radiology Dg Chest 2 View  Result Date: 03/29/2018 CLINICAL DATA:  Hypoxia. EXAM: CHEST - 2 VIEW COMPARISON:  None. FINDINGS: The heart size and mediastinal contours are within normal limits. Both lungs are clear. The visualized skeletal structures are unremarkable. IMPRESSION: No active cardiopulmonary disease. Electronically Signed   By: Obie Dredge M.D.   On: 03/29/2018 16:26   Dg Pelvis 1-2 Views  Result Date: 03/29/2018 CLINICAL DATA:  Bilateral hip pain. EXAM: PELVIS - 1-2 VIEW COMPARISON:  None. FINDINGS: Bones are diffusely demineralized. SI joints  and symphysis pubis unremarkable. No evidence for an acute fracture. IMPRESSION: Negative. Electronically Signed   By: Kennith Center M.D.   On: 03/29/2018 16:29   Ct Head Wo Contrast  Result Date: 03/29/2018 CLINICAL DATA:  Weakness for the past 3 days. EXAM: CT HEAD WITHOUT CONTRAST TECHNIQUE: Contiguous axial images were obtained from the base of the skull through the vertex without intravenous contrast. COMPARISON:  None. FINDINGS: Brain: Mild-to-moderate enlargement of the ventricles and subarachnoid spaces. Moderate patchy white matter low density in both cerebral hemispheres. No intracranial hemorrhage, mass lesion or CT evidence of acute infarction. Vascular: No hyperdense vessel or unexpected calcification. Skull: Normal. Negative for fracture or focal lesion. Sinuses/Orbits: Unremarkable. Other: None. IMPRESSION: 1. No acute abnormality. 2. Mild to moderate diffuse cerebral and cerebellar atrophy. 3. Moderate chronic small vessel white matter ischemic changes in both cerebral hemispheres. Electronically Signed   By: Beckie Salts M.D.   On: 03/29/2018 16:40    Procedures Procedures (including critical care time)  Medications Ordered in ED Medications  vancomycin (VANCOCIN) IVPB 1000 mg/200 mL premix (has no administration in time range)  potassium chloride SA (K-DUR,KLOR-CON) CR tablet 40 mEq (has no administration in time range)  sodium chloride 0.9 % bolus 500 mL (0 mLs Intravenous Stopped 03/29/18 1720)  potassium chloride SA (K-DUR,KLOR-CON) CR tablet 40 mEq (40 mEq Oral Given 03/29/18 1832)     Initial Impression / Assessment and Plan / ED Course  I have reviewed the triage vital signs and the nursing notes.  Pertinent labs & imaging results that were available during my care of the patient were reviewed by me and considered in my medical decision making (see chart for details).     Hypokalemia.  Chronic leukocytosis which is worse today.  Suspect patient has developed  cellulitis of the right leg which may be contributing to her overall weakness.  Given IV fluids and potassium replacement.  Will start on antibiotics.  Discussed with hospitalist will see patient and admit.  Patient did have recorded hypoxia into the 80s.  Chest x-ray without acute findings.  Placed on supplemental oxygen.  Final Clinical Impressions(s) / ED Diagnoses   Final diagnoses:  Cellulitis of right leg  Hypokalemia  Generalized weakness    ED  Discharge Orders    None       Loren RacerYelverton, Tod Abrahamsen, MD 03/29/18 1911

## 2018-03-29 NOTE — ED Notes (Signed)
Contacted hospitalist about tramadol 100 mg's home medication.

## 2018-03-29 NOTE — H&P (Signed)
Patient Demographics:    Denise Underwood, is a 72 y.o. female  MRN: [id number]   DOB - 06-13-1945  Admit Date - 03/29/2018  Outpatient Primary MD for the patient is Remus Loffler, PA-C   Assessment & Plan:    Principal Problem:   Groin/inguinal and infra-mammary Area Candida intertrigo with superimposed cellulitis Active Problems:   Hypertension   Leukocytosis   Tinea corporis   Candidiasis of skin/Groin/inguinal and infra-mammary Area Candida intertrigo with superimposed cellulitis   Candidal intertrigo/Groin/inguinal and infra-mammary Area Candida intertrigo with superimposed cellulitis   Falls   Hypokalemia    1)Groin/inguinal and infra-mammary Area Candida intertrigo with superimposed cellulitis----WBC is higher than her baseline, blood cultures pending, treat empirically with IV Diflucan, IV Rocephin and topical Mycolog ointment, patient's 2 daughters will try to help going forward with inframammary and groin area hygiene.... Patient admits that she goes at  Times up to  10 days without taking a shower or Bath due to fear of falling  2)Leukocytosis-not new, patient with long-standing history of persistent leukocytosis without thrombocytopenia or Anemia, most likely due to underlying inflammatory disease (Psoriais) as well as off and on steroid use, patient's baseline WBC is usually around 15,000, she has had extensive hematology/oncology work-up previously at [facility] and then subsequently with Dr. Darnelle Catalan at [facility].  WBC is probably higher due to skin infection as outlined above #1, blood cultures are pending,  3)Hypokalemia/hypomagnesemia --- magnesium is down to 1.2 with a potassium of 2.9, secondary to high-dose HCTZ use, hold HCTZ, replace and recheck potassium and magnesium,   4)Generalized weakness with  recurrent falls--- at baseline she ambulates with a walker, lately has been having recurrent falls, hypokalemia with muscle weakness may be contributing to her fall, get physical therapy evaluation-CT head without acute findings.. Hip x-rays also without acute findings. Pt lives alone and  will most likely need home health PT post discharge  5)Psoriasis--- okay to continue topical steroid cream, sed rate is 68, hold off on oral or IV steroids at this time as no significant flareup of her psoriasis  6)HTN--- hold HCTZ due to electrolyte abnormalities as above,   may use IV Hydralazine 10 mg  Every 4 hours Prn for systolic blood pressure over 160 mmhg  7)COPD --- patient with over 40 pack years smoking history, quit smoking a couple weeks ago, had transient hypoxia in the ED, overall no frank COPD exacerbation at this time, hold off on systemic steroids, okay to use bronchodilators, upon discharge continue with discharge home on Spiriva or some other long-acting agent  8)Tobacco Abuse-patient last smoked apparently couple weeks ago, more than 40 pack years, encouraged to stay abstinent from tobacco  With History of - Reviewed by me  Past Medical History:  Diagnosis Date  . Anxiety   . Arthritis   . Hyperlipidemia   . Hypertension   . Psoriasis       Past Surgical History:  Procedure  Laterality Date  . ABDOMINAL HYSTERECTOMY    . CHOLECYSTECTOMY    . HERNIA REPAIR    . KNEE SURGERY    . TUBAL LIGATION      Chief Complaint  Patient presents with  . Weakness      HPI:    Denise Underwood  is a 72 y.o. female past medical history relevant for COPD, tobacco abuse, chronic leukocytosis, psoriasis and recurrent candidal intertrigo of the groin/inguinal area and inframammary areas who presents to the ED with generalized weakness and fatigue as well as recurrent falls at home  Patient lives alone, denies head injury, denies syncopal events, no chest pains no palpitations no dizziness she  does complain of leg weakness  Additional history obtained from 2 daughters at bedside...Marland KitchenMarland KitchenMarland Kitchen there are concerns about his skin hygiene, Patient admits that she goes at  Times up to  10 days without taking a shower or Bath due to fear of falling  In ED... Patient was found to have transient hypoxia, no wheezing no significant productive cough no fevers patient last smoked apparently couple weeks ago, more than 40 pack years, encouraged to stay abstinent from tobacco  In ED she is found to have leukocytosis but please note that patient has had chronic leukocytosis for many many years----she has had extensive hematology/oncology work-up at [facility] previously and as well as with Dr. Darnelle Catalan at [facility], a baseline white count is usually around 15 K  EDP--EDP obtained CT head, chest x-ray and hip x-rays due to history of recurrent falls, no acute findings on imaging studies    Review of systems:    In addition to the HPI above,   A full Review of  Systems was done, all other systems reviewed are negative except as noted above in HPI , .   Social History:  Reviewed by me    Social History   Tobacco Use  . Smoking status: Current Every Day Smoker  . Smokeless tobacco: Never Used  Substance Use Topics  . Alcohol use: No    Frequency: Never     Family History :  Reviewed by me  HTN   Home Medications:   Prior to Admission medications   Medication Sig Start Date End Date Taking? Authorizing Provider  ALPRAZolam Prudy Feeler) 0.5 MG tablet Take 1 tablet (0.5 mg total) by mouth 2 (two) times daily. For anxiety. 11/26/17  Yes Remus Loffler, PA-C  cephALEXin (KEFLEX) 500 MG capsule Take 1 capsule (500 mg total) by mouth 2 (two) times daily. 02/17/18  Yes Remus Loffler, PA-C  Cholecalciferol (VITAMIN D3) 1000 units CAPS Take by mouth.   Yes [provider]  citalopram (CELEXA) 20 MG tablet Take 20 mg by mouth daily. For depression   Yes [provider]  Clobetasol Prop  Emollient Base (CLOBETASOL PROPIONATE E) 0.05 % emollient cream Apply 1 application topically 2 (two) times daily. Apply to ECZEMA areas 09/05/17  Yes Remus Loffler, PA-C  Diclofenac Sodium 3 % GEL Place 4 g onto the skin 4 (four) times daily as needed. 10/17/17  Yes Remus Loffler, PA-C  econazole nitrate 1 % cream Apply topically daily. Apply to YEAST areas 09/05/17  Yes Remus Loffler, PA-C  fluconazole (DIFLUCAN) 150 MG tablet Take 1 tablet by mouth daily. 03/16/18  Yes [provider]  hydrochlorothiazide (HYDRODIURIL) 25 MG tablet Take 1-2 tablets (25-50 mg total) by mouth daily. For blood pressure and fluid 09/05/17  Yes Remus Loffler, PA-C  Omega-3  Fatty Acids (FISH OIL) 1000 MG CAPS Take by mouth.   Yes [provider]  rosuvastatin (CRESTOR) 20 MG tablet Take 1 tablet (20 mg total) by mouth daily. 05/30/17  Yes Remus Loffler, PA-C  traMADol (ULTRAM) 50 MG tablet Take 2 tablets (100 mg total) by mouth every 6 (six) hours as needed. For pain 12/12/17  Yes Remus Loffler, PA-C  citalopram (CELEXA) 40 MG tablet Take 1 tablet (40 mg total) by mouth daily. For depression 11/26/17   Remus Loffler, PA-C  prednisoLONE 5 MG TABS tablet Take 1 tablet (5 mg total) by mouth 2 (two) times daily. 11/26/17   Remus Loffler, PA-C    Allergies:    No Known Allergies   Physical Exam:   Vitals  Blood pressure (!) 107/46, pulse 95, temperature 99.4 F (37.4 C), temperature source Oral, resp. rate (!) 28, weight 82.3 kg, SpO2 98 %.  Physical Examination: General appearance - alert, well appearing, and in no distress  Mental status - alert, oriented to person, place, and time, Eyes - sclera anicteric Neck - supple, no JVD elevation , Chest - clear  to auscultation bilaterally, symmetrical air movement,  Heart - S1 and S2 normal, regular  Abdomen - soft, nontender, nondistended, no masses or organomegaly Neurological - screening mental status exam normal, neck supple without rigidity,  cranial nerves II through XII intact, DTR's normal and symmetric Extremities - no pedal edema noted, intact peripheral pulses  Skin -very extensive areas of maceration, significant redness, warmth, induration with satellite lesions in the groin/inguinal areas as well as the inframammary areas consistent with significant Candida intertrigo with superimposed cellulitis, no fluctuance, no streaking,   Additionally patient also has scaly erythematous lesions/plaques all over her extremities and trunk consistent with psoriasis    Data Review:    CBC Recent Labs  Lab 03/29/18 1540  WBC 20.5*  HGB 12.7  HCT 40.0  PLT 340  MCV 102.0*  MCH 32.4  MCHC 31.8  RDW 13.2  LYMPHSABS 2.7  MONOABS 1.8*  EOSABS 0.3  BASOSABS 0.1   ------------------------------------------------------------------------------------------------------------------  Chemistries  Recent Labs  Lab 03/29/18 1540 03/29/18 1934  NA 136  --   K 2.9*  --   CL 91*  --   CO2 35*  --   GLUCOSE 115*  --   BUN 16  --   CREATININE 1.23*  --   CALCIUM 9.1  --   MG  --  1.2*  AST 19  --   ALT 10  --   ALKPHOS 48  --   BILITOT 1.1  --    ------------------------------------------------------------------------------------------------------------------ estimated creatinine clearance is 42.2 mL/min (A) (by C-G formula based on SCr of 1.23 mg/dL (H)). ------------------------------------------------------------------------------------------------------------------ No results for input(s): TSH, T4TOTAL, T3FREE, THYROIDAB in the last 72 hours.  Invalid input(s): FREET3   Coagulation profile No results for input(s): INR, PROTIME in the last 168 hours. ------------------------------------------------------------------------------------------------------------------- No results for input(s): DDIMER in the last 72  hours. -------------------------------------------------------------------------------------------------------------------  Cardiac Enzymes No results for input(s): CKMB, TROPONINI, MYOGLOBIN in the last 168 hours.  Invalid input(s): CK ------------------------------------------------------------------------------------------------------------------ No results found for: BNP   ---------------------------------------------------------------------------------------------------------------  Urinalysis    Component Value Date/Time   COLORURINE YELLOW 03/29/2018 1508   APPEARANCEUR HAZY (A) 03/29/2018 1508   APPEARANCEUR Clear 05/30/2017 1019   LABSPEC 1.018 03/29/2018 1508   PHURINE 6.0 03/29/2018 1508   GLUCOSEU NEGATIVE 03/29/2018 1508   HGBUR MODERATE (A) 03/29/2018 1508   BILIRUBINUR NEGATIVE 03/29/2018  1508   BILIRUBINUR Negative 05/30/2017 1019   KETONESUR NEGATIVE 03/29/2018 1508   PROTEINUR 100 (A) 03/29/2018 1508   NITRITE NEGATIVE 03/29/2018 1508   LEUKOCYTESUR NEGATIVE 03/29/2018 1508   LEUKOCYTESUR 2+ (A) 05/30/2017 1019    ----------------------------------------------------------------------------------------------------------------   Imaging Results:    Dg Chest 2 View  Result Date: 03/29/2018 CLINICAL DATA:  Hypoxia. EXAM: CHEST - 2 VIEW COMPARISON:  None. FINDINGS: The heart size and mediastinal contours are within normal limits. Both lungs are clear. The visualized skeletal structures are unremarkable. IMPRESSION: No active cardiopulmonary disease. Electronically Signed   By: Obie DredgeWilliam T Derry M.D.   On: 03/29/2018 16:26   Dg Pelvis 1-2 Views  Result Date: 03/29/2018 CLINICAL DATA:  Bilateral hip pain. EXAM: PELVIS - 1-2 VIEW COMPARISON:  None. FINDINGS: Bones are diffusely demineralized. SI joints and symphysis pubis unremarkable. No evidence for an acute fracture. IMPRESSION: Negative. Electronically Signed   By: Kennith CenterEric  Mansell M.D.   On: 03/29/2018 16:29    Ct Head Wo Contrast  Result Date: 03/29/2018 CLINICAL DATA:  Weakness for the past 3 days. EXAM: CT HEAD WITHOUT CONTRAST TECHNIQUE: Contiguous axial images were obtained from the base of the skull through the vertex without intravenous contrast. COMPARISON:  None. FINDINGS: Brain: Mild-to-moderate enlargement of the ventricles and subarachnoid spaces. Moderate patchy white matter low density in both cerebral hemispheres. No intracranial hemorrhage, mass lesion or CT evidence of acute infarction. Vascular: No hyperdense vessel or unexpected calcification. Skull: Normal. Negative for fracture or focal lesion. Sinuses/Orbits: Unremarkable. Other: None. IMPRESSION: 1. No acute abnormality. 2. Mild to moderate diffuse cerebral and cerebellar atrophy. 3. Moderate chronic small vessel white matter ischemic changes in both cerebral hemispheres. Electronically Signed   By: Beckie SaltsSteven  Reid M.D.   On: 03/29/2018 16:40    Radiological Exams on Admission: Dg Chest 2 View  Result Date: 03/29/2018 CLINICAL DATA:  Hypoxia. EXAM: CHEST - 2 VIEW COMPARISON:  None. FINDINGS: The heart size and mediastinal contours are within normal limits. Both lungs are clear. The visualized skeletal structures are unremarkable. IMPRESSION: No active cardiopulmonary disease. Electronically Signed   By: Obie DredgeWilliam T Derry M.D.   On: 03/29/2018 16:26   Dg Pelvis 1-2 Views  Result Date: 03/29/2018 CLINICAL DATA:  Bilateral hip pain. EXAM: PELVIS - 1-2 VIEW COMPARISON:  None. FINDINGS: Bones are diffusely demineralized. SI joints and symphysis pubis unremarkable. No evidence for an acute fracture. IMPRESSION: Negative. Electronically Signed   By: Kennith CenterEric  Mansell M.D.   On: 03/29/2018 16:29   Ct Head Wo Contrast  Result Date: 03/29/2018 CLINICAL DATA:  Weakness for the past 3 days. EXAM: CT HEAD WITHOUT CONTRAST TECHNIQUE: Contiguous axial images were obtained from the base of the skull through the vertex without intravenous contrast.  COMPARISON:  None. FINDINGS: Brain: Mild-to-moderate enlargement of the ventricles and subarachnoid spaces. Moderate patchy white matter low density in both cerebral hemispheres. No intracranial hemorrhage, mass lesion or CT evidence of acute infarction. Vascular: No hyperdense vessel or unexpected calcification. Skull: Normal. Negative for fracture or focal lesion. Sinuses/Orbits: Unremarkable. Other: None. IMPRESSION: 1. No acute abnormality. 2. Mild to moderate diffuse cerebral and cerebellar atrophy. 3. Moderate chronic small vessel white matter ischemic changes in both cerebral hemispheres. Electronically Signed   By: Beckie SaltsSteven  Reid M.D.   On: 03/29/2018 16:40    DVT Prophylaxis -SCD /heparin AM Labs Ordered, also please review Full Orders  Family Communication: Admission, patients condition and plan of care including tests being ordered have been discussed with the patient  and daughters x 2 who indicate understanding and agree with the plan   Code Status - Full Code  Likely DC to  Home with [facility] PT  Condition   -stable  Shon Hale M.D on 03/29/2018 at 8:41 PM Pager---[phone] Go to [email or web address] - [email or web address] for contact info  [facility] - Office  [phone]

## 2018-03-30 ENCOUNTER — Observation Stay (HOSPITAL_COMMUNITY): Payer: Medicare HMO

## 2018-03-30 ENCOUNTER — Ambulatory Visit: Payer: Medicare HMO | Admitting: Physician Assistant

## 2018-03-30 DIAGNOSIS — I10 Essential (primary) hypertension: Secondary | ICD-10-CM

## 2018-03-30 DIAGNOSIS — R600 Localized edema: Secondary | ICD-10-CM | POA: Diagnosis not present

## 2018-03-30 DIAGNOSIS — W19XXXD Unspecified fall, subsequent encounter: Secondary | ICD-10-CM

## 2018-03-30 LAB — BASIC METABOLIC PANEL
Anion gap: 8 (ref 5–15)
BUN: 17 mg/dL (ref 8–23)
CHLORIDE: 96 mmol/L — AB (ref 98–111)
CO2: 35 mmol/L — AB (ref 22–32)
CREATININE: 1.18 mg/dL — AB (ref 0.44–1.00)
Calcium: 8.5 mg/dL — ABNORMAL LOW (ref 8.9–10.3)
GFR calc non Af Amer: 45 mL/min — ABNORMAL LOW (ref >60)
GFR, EST AFRICAN AMERICAN: 52 mL/min — AB (ref >60)
GLUCOSE: 93 mg/dL (ref 70–99)
Potassium: 3.6 mmol/L (ref 3.5–5.1)
Sodium: 139 mmol/L (ref 135–145)

## 2018-03-30 LAB — CBC
HEMATOCRIT: 35.1 % — AB (ref 36.0–46.0)
Hemoglobin: 10.7 g/dL — ABNORMAL LOW (ref 12.0–15.0)
MCH: 31.4 pg (ref 26.0–34.0)
MCHC: 30.5 g/dL (ref 30.0–36.0)
MCV: 102.9 fL — AB (ref 80.0–100.0)
NRBC: 0 % (ref 0.0–0.2)
Platelets: 298 10*3/uL (ref 150–400)
RBC: 3.41 MIL/uL — ABNORMAL LOW (ref 3.87–5.11)
RDW: 13.3 % (ref 11.5–15.5)
WBC: 16.6 10*3/uL — ABNORMAL HIGH (ref 4.0–10.5)

## 2018-03-30 LAB — MAGNESIUM: Magnesium: 1.7 mg/dL (ref 1.7–2.4)

## 2018-03-30 MED ORDER — FLUCONAZOLE 150 MG PO TABS
150.0000 mg | ORAL_TABLET | Freq: Every day | ORAL | Status: DC
  Administered 2018-03-30 – 2018-04-03 (×5): 150 mg via ORAL
  Filled 2018-03-30 (×5): qty 1

## 2018-03-30 MED ORDER — NYSTATIN 100000 UNIT/GM EX POWD
Freq: Three times a day (TID) | CUTANEOUS | Status: DC
  Administered 2018-03-30: 22:00:00 via TOPICAL
  Administered 2018-03-30: 1 via TOPICAL
  Administered 2018-03-31 – 2018-04-03 (×10): via TOPICAL
  Filled 2018-03-30 (×2): qty 15

## 2018-03-30 MED ORDER — ALPRAZOLAM 0.5 MG PO TABS
0.5000 mg | ORAL_TABLET | Freq: Three times a day (TID) | ORAL | Status: DC | PRN
  Administered 2018-03-30 – 2018-04-02 (×5): 0.5 mg via ORAL
  Filled 2018-03-30 (×5): qty 1

## 2018-03-30 MED ORDER — NYSTATIN-TRIAMCINOLONE 100000-0.1 UNIT/GM-% EX CREA: TOPICAL_CREAM | Freq: Two times a day (BID) | CUTANEOUS | Status: DC

## 2018-03-30 MED ORDER — HYDROCORTISONE 1 % EX CREA
TOPICAL_CREAM | Freq: Two times a day (BID) | CUTANEOUS | Status: DC
  Filled 2018-03-30: qty 28

## 2018-03-30 NOTE — Progress Notes (Signed)
Patient assisted up to chair this morning by PT. Patient requested to use restroom for bowel movement after lunch. Nursing staff offered to assist her to bedside commode since she is unable to ambulate to bathroom at this time. Daughter at bedside requested she be assisted to the bathroom per her wishes. Discussed fall prevention/safety with patient and daughter. Patient verbalized understanding of need to use bedside commode rather than ambulating to bathroom for safety. Assisted up to bedside commode with 4 nursing staff assist and gait belt to stand and pivot. Reinforced fall prevention/safety plan with patient and daughter at bedside. Earnstine RegalAshley Anav Lammert, RN

## 2018-03-30 NOTE — Plan of Care (Signed)
  Problem: Acute Rehab PT Goals(only PT should resolve) Goal: Pt Will Go Supine/Side To Sit Outcome: Progressing Flowsheets (Taken 03/30/2018 1404) Pt will go Supine/Side to Sit: with minimal assist Goal: Patient Will Transfer Sit To/From Stand Outcome: Progressing Flowsheets (Taken 03/30/2018 1404) Patient will transfer sit to/from stand: with minimal assist Goal: Pt Will Transfer Bed To Chair/Chair To Bed Outcome: Progressing Flowsheets (Taken 03/30/2018 1404) Pt will Transfer Bed to Chair/Chair to Bed: with min assist Goal: Pt Will Ambulate Outcome: Progressing Flowsheets (Taken 03/30/2018 1404) Pt will Ambulate: 25 feet; with minimal assist; with rolling walker   2:05 PM, 03/30/18 Denise Underwood Serenity Batley, MPT Physical Therapist [facility] [phone] office [phone] mobile phone

## 2018-03-30 NOTE — Evaluation (Signed)
Physical Therapy Evaluation Patient Details Name: Denise Underwood MRN: [id number] DOB: 06-Jul-1945 Today's Date: 03/30/2018   History of Present Illness  Denise Underwood  is a 72 y.o. female past medical history relevant for COPD, tobacco abuse, chronic leukocytosis, psoriasis and recurrent candidal intertrigo of the groin/inguinal area and inframammary areas who presents to the ED with generalized weakness and fatigue as well as recurrent falls at home    Clinical Impression  Patient demonstrates slow labored movement for sitting up at bedside with frequent verbal/tactile cueing to properly use BUE for scooting up to bedside, limited to a few steps at bedside due to poor standing balance, BLE weakness and bilateral ankle/foot pain.  Patient tolerated sitting up in chair after therapy.  Patient will benefit from continued physical therapy in hospital and recommended venue below to increase strength, balance, endurance for safe ADLs and gait.    Follow Up Recommendations SNF;Supervision/Assistance - 24 hour;Supervision for mobility/OOB    Equipment Recommendations  None recommended by PT    Recommendations for Other Services       Precautions / Restrictions Precautions Precautions: Fall Restrictions Weight Bearing Restrictions: No      Mobility  Bed Mobility Overal bed mobility: Needs Assistance Bed Mobility: Supine to Sit     Supine to sit: Mod assist     General bed mobility comments: slow labored movement with frequent verbal/tactile cueing to complete task  Transfers Overall transfer level: Needs assistance Equipment used: Rolling walker (2 wheeled) Transfers: Sit to/from [employer] Sit to Stand: Mod assist Stand pivot transfers: Mod assist       General transfer comment: slow labored movement  Ambulation/Gait Ambulation/Gait assistance: Mod assist;Max assist Gait Distance (Feet): 4 Feet Assistive device: Rolling walker (2 wheeled) Gait  Pattern/deviations: Decreased step length - right;Decreased step length - left;Decreased stride length Gait velocity: slow   General Gait Details: limited to 6-7 short unsteady side steps due to BLE weakness, poor standing balance and c/o severe right ankle pain   Stairs            Wheelchair Mobility    Modified Rankin (Stroke Patients Only)       Balance Overall balance assessment: Needs assistance Sitting-balance support: Feet supported;No upper extremity supported Sitting balance-Leahy Scale: Fair     Standing balance support: Bilateral upper extremity supported;During functional activity Standing balance-Leahy Scale: Poor Standing balance comment: fair/poor with RW                             Pertinent Vitals/Pain Pain Assessment: 0-10 Pain Score: 9  Pain Location: bilateral ankles/proximal top of foot, right worse than left Pain Descriptors / Indicators: Aching;Sore;Discomfort;Grimacing;Guarding Pain Intervention(s): Limited activity within patient's tolerance;Monitored during session;Patient requesting pain meds-RN notified    Home Living Family/patient expects to be discharged to:: Private residence Living Arrangements: Alone Available Help at Discharge: Family Type of Home: Apartment Home Access: Level entry     Home Layout: One level Home Equipment: Environmental consultant - 2 wheels;Walker - 4 wheels;Bedside commode;Shower seat      Prior Function Level of Independence: Independent               Hand Dominance        Extremity/Trunk Assessment   Upper Extremity Assessment Upper Extremity Assessment: Generalized weakness    Lower Extremity Assessment Lower Extremity Assessment: Generalized weakness    Cervical / Trunk Assessment Cervical / Trunk Assessment: Normal  Communication  Communication: No difficulties  Cognition Arousal/Alertness: Awake/alert Behavior During Therapy: WFL for tasks assessed/performed Overall Cognitive Status:  Within Functional Limits for tasks assessed                                        General Comments      Exercises     Assessment/Plan    PT Assessment Patient needs continued PT services  PT Problem List Decreased strength;Decreased activity tolerance;Decreased balance;Decreased mobility       PT Treatment Interventions Gait training;Stair training;Functional mobility training;Therapeutic activities;Therapeutic exercise;Patient/family education    PT Goals (Current goals can be found in the Care Plan section)  Acute Rehab PT Goals Patient Stated Goal: return home after rehab PT Goal Formulation: With patient/family Time For Goal Achievement: 04/13/18 Potential to Achieve Goals: Good    Frequency Min 3X/week   Barriers to discharge        Co-evaluation               AM-PAC PT "6 Clicks" Daily Activity  Outcome Measure Difficulty turning over in bed (including adjusting bedclothes, sheets and blankets)?: Unable Difficulty moving from lying on back to sitting on the side of the bed? : Unable Difficulty sitting down on and standing up from a chair with arms (e.g., wheelchair, bedside commode, etc,.)?: Unable Help needed moving to and from a bed to chair (including a wheelchair)?: A Lot Help needed walking in hospital room?: A Lot Help needed climbing 3-5 steps with a railing? : Total 6 Click Score: 8    End of Session Equipment Utilized During Treatment: Gait belt Activity Tolerance: Patient tolerated treatment well;Patient limited by fatigue;Patient limited by pain Patient left: in chair;with call bell/phone within reach Nurse Communication: Mobility status;Patient requests pain meds PT Visit Diagnosis: Unsteadiness on feet (R26.81);Other abnormalities of gait and mobility (R26.89);Muscle weakness (generalized) (M62.81)    Time: [id number] PT Time Calculation (min) (ACUTE ONLY): 31 min   Charges:   PT Evaluation $PT Eval Moderate Complexity:  1 Mod PT Treatments $Therapeutic Activity: 23-37 mins        2:03 PM, 03/30/18 Ocie BobJames Rocko Fesperman, MPT [facility] Therapist [facility] 336 [phone] office [phone] mobile phone

## 2018-03-30 NOTE — Care Management Obs Status (Signed)
MEDICARE OBSERVATION STATUS NOTIFICATION   Patient Details  Name: Denise Underwood MRN: [id number] Date of Birth: 01/11/1946   Medicare Observation Status Notification Given:  Yes    Denise Underwood, Chrystine OilerSharley Diane, RN 03/30/2018, 7:43 AM

## 2018-03-30 NOTE — Progress Notes (Signed)
Patient's daughter requested "she needs nystatin powder instead of creams. Her doctor told her not to use creams anymore." discussed with Dr. Kerry HoughMemon. Also requested order for interdry for MASD to skin folds if possible. Earnstine RegalAshley Phoebie Shad, RN

## 2018-03-30 NOTE — Progress Notes (Signed)
PROGRESS NOTE    Denise Underwood  ZOX:[id number] DOB: [birth year] DOA: 03/29/2018 PCP: Caryl Never    Brief Narrative:  72 year old female with a history of COPD chronic leukocytosis, psoriasis, is brought to the hospital by family with generalized weakness.  She has had several falls in the past few weeks.  They also noted that she had significant cellulitis/intertrigo in her skin folds.  She was started on intravenous antibiotics and antifungal agents.  She was noted to be hypokalemic and mildly dehydrated.  Electrolytes are being replaced.  She was admitted for further treatment.   Assessment & Plan:   Principal Problem:   Groin/inguinal and infra-mammary Area Candida intertrigo with superimposed cellulitis Active Problems:   Hypertension   Leukocytosis   Tinea corporis   Candidiasis of skin/Groin/inguinal and infra-mammary Area Candida intertrigo with superimposed cellulitis   Candidal intertrigo/Groin/inguinal and infra-mammary Area Candida intertrigo with superimposed cellulitis   Falls   Hypokalemia   1. Candida intertrigo with superimposed cellulitis.  Currently on Diflucan, nystatin powder as well as intravenous ceftriaxone.  Continue current treatments. 2. Leukocytosis.  Likely elevated due to infection, although she does have a chronic leukocytosis.  Continue to monitor. 3. Hypokalemia/hypomagnesemia.  Replace.  Hold hydrochlorothiazide. 4. Generalized weakness.  Seen by physical therapy recommended skilled nursing facility placement.  Patient and family have declined placement and have elected to discharge home with home health physical therapy. 5. Right ankle pain.  Patient reports this occurred after a fall.  She has increased pain on range of motion.  Will check x-ray of right foot. 6. COPD.  No shortness of breath or wheezing at this time.  Continue home medications. 7. Hypertension.  Hydrochlorothiazide currently on hold.  Continue to follow blood pressure  since this is running low at this time.  Excellent psoriasis.  No evidence of flares at this time.   DVT prophylaxis: Heparin Code Status: Full code Family Communication: Discussed with daughters at the bedside Disposition Plan: Physical therapy recommended skilled nursing facility placement, but patient and family have elected to discharge home with home health   Consultants:     Procedures:     Antimicrobials:   Ceftriaxone 11/17>   Subjective: Complaining of pain in feet bilaterally, more so on the right foot.  This occurred after her fall that occurred 4 days ago.  Objective: Vitals:   03/30/18 1003 03/30/18 1420 03/30/18 1500 03/30/18 1628  BP:  (!) 83/61 98/62   Pulse:  86    Resp:  18    Temp:      TempSrc:      SpO2: 92% 95%  95%  Weight:        Intake/Output Summary (Last 24 hours) at 03/30/2018 1742 Last data filed at 03/30/2018 1736 Gross per 24 hour  Intake 1499.88 ml  Output 350 ml  Net 1149.88 ml   Filed Weights   03/29/18 1451 03/29/18 2135  Weight: 82.3 kg 87.7 kg    Examination:  General exam: Appears calm and comfortable  Respiratory system: Clear to auscultation. Respiratory effort normal. Cardiovascular system: S1 & S2 heard, RRR. No JVD, murmurs, rubs, gallops or clicks. No pedal edema. Gastrointestinal system: Abdomen is nondistended, soft and nontender. No organomegaly or masses felt. Normal bowel sounds heard. Central nervous system: Alert and oriented. No focal neurological deficits. Extremities: Increased pain with range of motion of right ankle Skin: Moisture associated skin breakdown, erythema under bilateral breasts line and bilateral groins. Psychiatry: Judgement and insight appear normal.  Mood & affect appropriate.     Data Reviewed: I have personally reviewed following labs and imaging studies  CBC: Recent Labs  Lab 03/29/18 1540 03/30/18 0504  WBC 20.5* 16.6*  NEUTROABS 15.4*  --   HGB 12.7 10.7*  HCT 40.0 35.1*    MCV 102.0* 102.9*  PLT 340 298   Basic Metabolic Panel: Recent Labs  Lab 03/29/18 1540 03/29/18 1934 03/30/18 0504  NA 136  --  139  K 2.9*  --  3.6  CL 91*  --  96*  CO2 35*  --  35*  GLUCOSE 115*  --  93  BUN 16  --  17  CREATININE 1.23*  --  1.18*  CALCIUM 9.1  --  8.5*  MG  --  1.2* 1.7   GFR: Estimated Creatinine Clearance: 45.5 mL/min (A) (by C-G formula based on SCr of 1.18 mg/dL (H)). Liver Function Tests: Recent Labs  Lab 03/29/18 1540  AST 19  ALT 10  ALKPHOS 48  BILITOT 1.1  PROT 7.1  ALBUMIN 3.0*   No results for input(s): LIPASE, AMYLASE in the last 168 hours. No results for input(s): AMMONIA in the last 168 hours. Coagulation Profile: No results for input(s): INR, PROTIME in the last 168 hours. Cardiac Enzymes: No results for input(s): CKTOTAL, CKMB, CKMBINDEX, TROPONINI in the last 168 hours. BNP (last 3 results) No results for input(s): PROBNP in the last 8760 hours. HbA1C: No results for input(s): HGBA1C in the last 72 hours. CBG: No results for input(s): GLUCAP in the last 168 hours. Lipid Profile: No results for input(s): CHOL, HDL, LDLCALC, TRIG, CHOLHDL, LDLDIRECT in the last 72 hours. Thyroid Function Tests: No results for input(s): TSH, T4TOTAL, FREET4, T3FREE, THYROIDAB in the last 72 hours. Anemia Panel: No results for input(s): VITAMINB12, FOLATE, FERRITIN, TIBC, IRON, RETICCTPCT in the last 72 hours. Sepsis Labs: No results for input(s): PROCALCITON, LATICACIDVEN in the last 168 hours.  Recent Results (from the past 240 hour(s))  Culture, blood (Routine X 2) w Reflex to ID Panel     Status: None (Preliminary result)   Collection Time: 03/29/18  3:36 PM  Result Value Ref Range Status   Specimen Description LEFT ANTECUBITAL  Final   Special Requests   Final    BOTTLES DRAWN AEROBIC AND ANAEROBIC Blood Culture results may not be optimal due to an excessive volume of blood received in culture bottles   Culture   Final    NO GROWTH  < 24 HOURS Performed at [facility], [address], [place], [place] [address]    Report Status PENDING  Incomplete  Culture, blood (Routine X 2) w Reflex to ID Panel     Status: None (Preliminary result)   Collection Time: 03/29/18  7:34 PM  Result Value Ref Range Status   Specimen Description RIGHT ANTECUBITAL  Final   Special Requests   Final    BOTTLES DRAWN AEROBIC AND ANAEROBIC Blood Culture adequate volume   Culture   Final    NO GROWTH < 24 HOURS Performed at [facility], [address], [place], [place] [address]    Report Status PENDING  Incomplete         Radiology Studies: Dg Chest 2 View  Result Date: 03/29/2018 CLINICAL DATA:  Hypoxia. EXAM: CHEST - 2 VIEW COMPARISON:  None. FINDINGS: The heart size and mediastinal contours are within normal limits. Both lungs are clear. The visualized skeletal structures are unremarkable. IMPRESSION: No active cardiopulmonary disease. Electronically Signed  By: Obie DredgeWilliam T Derry M.D.   On: 03/29/2018 16:26   Dg Pelvis 1-2 Views  Result Date: 03/29/2018 CLINICAL DATA:  Bilateral hip pain. EXAM: PELVIS - 1-2 VIEW COMPARISON:  None. FINDINGS: Bones are diffusely demineralized. SI joints and symphysis pubis unremarkable. No evidence for an acute fracture. IMPRESSION: Negative. Electronically Signed   By: Kennith CenterEric  Mansell M.D.   On: 03/29/2018 16:29   Ct Head Wo Contrast  Result Date: 03/29/2018 CLINICAL DATA:  Weakness for the past 3 days. EXAM: CT HEAD WITHOUT CONTRAST TECHNIQUE: Contiguous axial images were obtained from the base of the skull through the vertex without intravenous contrast. COMPARISON:  None. FINDINGS: Brain: Mild-to-moderate enlargement of the ventricles and subarachnoid spaces. Moderate patchy white matter low density in both cerebral hemispheres. No intracranial hemorrhage, mass lesion or CT evidence of acute infarction. Vascular: No hyperdense vessel or unexpected calcification. Skull: Normal. Negative for fracture or  focal lesion. Sinuses/Orbits: Unremarkable. Other: None. IMPRESSION: 1. No acute abnormality. 2. Mild to moderate diffuse cerebral and cerebellar atrophy. 3. Moderate chronic small vessel white matter ischemic changes in both cerebral hemispheres. Electronically Signed   By: Beckie SaltsSteven  Reid M.D.   On: 03/29/2018 16:40   Dg Foot Complete Right  Result Date: 03/30/2018 CLINICAL DATA:  Foot pain. EXAM: RIGHT FOOT COMPLETE - 3+ VIEW COMPARISON:  None. FINDINGS: The bones appear osteopenic. There are no acute fractures or dislocations identified. Plantar heel spur noted. Mild diffuse soft tissue edema. IMPRESSION: 1. No acute bone abnormality. 2. Mild soft tissue edema. 3. Plantar heel spur Electronically Signed   By: Signa Kellaylor  Stroud M.D.   On: 03/30/2018 16:20        Scheduled Meds: . citalopram  20 mg Oral Daily  . fluconazole  150 mg Oral Daily  . heparin  5,000 Units Subcutaneous Q8H  . ipratropium-albuterol  3 mL Nebulization TID  . nystatin   Topical TID  . rosuvastatin  20 mg Oral q1800  . sodium chloride flush  3 mL Intravenous Q12H   Continuous Infusions: . sodium chloride 10 mL/hr at 03/29/18 2136  . sodium chloride    . sodium chloride 100 mL/hr at 03/30/18 1736  . cefTRIAXone (ROCEPHIN)  IV Stopped (03/30/18 0158)     LOS: 0 days    Time spent: 35mins    Erick BlinksJehanzeb Kasy Iannacone, MD [facility] Pager [phone]  If 7PM-7AM, please contact night-coverage [email or web address] Password [email or web address] 03/30/2018, 5:42 PM

## 2018-03-30 NOTE — Clinical Social Work Note (Addendum)
Patient is from home alone. At baseline, she ambulates with a walker, drives and is independent in ADLs. Patient states that she is going home at discharge. She indicates that her daughter will be staying to with her to provide 24/7 supervision. Patient is agreeable to HHPT.  LCSW to notify CM that patient is agreeable to HHPT.  Patient's Daughter, Philis NettleDenise Hill, confirms that they will be discharging home and that her sister will be staying with patient and that she will assist and be "in and out." She was given home health options and chose [facility].    Maanya Hippert, Juleen ChinaHeather D, LCSW

## 2018-03-31 DIAGNOSIS — D72820 Lymphocytosis (symptomatic): Secondary | ICD-10-CM | POA: Diagnosis not present

## 2018-03-31 DIAGNOSIS — L409 Psoriasis, unspecified: Secondary | ICD-10-CM | POA: Diagnosis present

## 2018-03-31 DIAGNOSIS — W19XXXA Unspecified fall, initial encounter: Secondary | ICD-10-CM | POA: Diagnosis present

## 2018-03-31 DIAGNOSIS — Z751 Person awaiting admission to adequate facility elsewhere: Secondary | ICD-10-CM | POA: Diagnosis not present

## 2018-03-31 DIAGNOSIS — E785 Hyperlipidemia, unspecified: Secondary | ICD-10-CM | POA: Diagnosis present

## 2018-03-31 DIAGNOSIS — L304 Erythema intertrigo: Secondary | ICD-10-CM | POA: Diagnosis present

## 2018-03-31 DIAGNOSIS — R402143 Coma scale, eyes open, spontaneous, at hospital admission: Secondary | ICD-10-CM | POA: Diagnosis present

## 2018-03-31 DIAGNOSIS — R531 Weakness: Secondary | ICD-10-CM

## 2018-03-31 DIAGNOSIS — Z792 Long term (current) use of antibiotics: Secondary | ICD-10-CM | POA: Diagnosis not present

## 2018-03-31 DIAGNOSIS — B372 Candidiasis of skin and nail: Secondary | ICD-10-CM | POA: Diagnosis present

## 2018-03-31 DIAGNOSIS — E876 Hypokalemia: Secondary | ICD-10-CM | POA: Diagnosis present

## 2018-03-31 DIAGNOSIS — Y92009 Unspecified place in unspecified non-institutional (private) residence as the place of occurrence of the external cause: Secondary | ICD-10-CM | POA: Diagnosis not present

## 2018-03-31 DIAGNOSIS — J449 Chronic obstructive pulmonary disease, unspecified: Secondary | ICD-10-CM | POA: Diagnosis present

## 2018-03-31 DIAGNOSIS — L0390 Cellulitis, unspecified: Secondary | ICD-10-CM | POA: Diagnosis not present

## 2018-03-31 DIAGNOSIS — M1990 Unspecified osteoarthritis, unspecified site: Secondary | ICD-10-CM | POA: Diagnosis present

## 2018-03-31 DIAGNOSIS — L03115 Cellulitis of right lower limb: Secondary | ICD-10-CM | POA: Diagnosis present

## 2018-03-31 DIAGNOSIS — D72828 Other elevated white blood cell count: Secondary | ICD-10-CM | POA: Diagnosis present

## 2018-03-31 DIAGNOSIS — R296 Repeated falls: Secondary | ICD-10-CM | POA: Diagnosis present

## 2018-03-31 DIAGNOSIS — F17200 Nicotine dependence, unspecified, uncomplicated: Secondary | ICD-10-CM | POA: Diagnosis present

## 2018-03-31 DIAGNOSIS — R402253 Coma scale, best verbal response, oriented, at hospital admission: Secondary | ICD-10-CM | POA: Diagnosis present

## 2018-03-31 DIAGNOSIS — Z7952 Long term (current) use of systemic steroids: Secondary | ICD-10-CM | POA: Diagnosis not present

## 2018-03-31 DIAGNOSIS — I10 Essential (primary) hypertension: Secondary | ICD-10-CM | POA: Diagnosis present

## 2018-03-31 DIAGNOSIS — S93401A Sprain of unspecified ligament of right ankle, initial encounter: Secondary | ICD-10-CM | POA: Diagnosis present

## 2018-03-31 DIAGNOSIS — R402363 Coma scale, best motor response, obeys commands, at hospital admission: Secondary | ICD-10-CM | POA: Diagnosis present

## 2018-03-31 DIAGNOSIS — R0902 Hypoxemia: Secondary | ICD-10-CM | POA: Diagnosis present

## 2018-03-31 DIAGNOSIS — F419 Anxiety disorder, unspecified: Secondary | ICD-10-CM | POA: Diagnosis present

## 2018-03-31 DIAGNOSIS — E860 Dehydration: Secondary | ICD-10-CM | POA: Diagnosis present

## 2018-03-31 LAB — CBC
HCT: 36.2 % (ref 36.0–46.0)
Hemoglobin: 10.8 g/dL — ABNORMAL LOW (ref 12.0–15.0)
MCH: 31 pg (ref 26.0–34.0)
MCHC: 29.8 g/dL — AB (ref 30.0–36.0)
MCV: 104 fL — AB (ref 80.0–100.0)
PLATELETS: 300 10*3/uL (ref 150–400)
RBC: 3.48 MIL/uL — AB (ref 3.87–5.11)
RDW: 13.3 % (ref 11.5–15.5)
WBC: 15.3 10*3/uL — ABNORMAL HIGH (ref 4.0–10.5)
nRBC: 0 % (ref 0.0–0.2)

## 2018-03-31 LAB — BASIC METABOLIC PANEL
Anion gap: 9 (ref 5–15)
BUN: 14 mg/dL (ref 8–23)
CHLORIDE: 101 mmol/L (ref 98–111)
CO2: 28 mmol/L (ref 22–32)
CREATININE: 1.2 mg/dL — AB (ref 0.44–1.00)
Calcium: 8.2 mg/dL — ABNORMAL LOW (ref 8.9–10.3)
GFR calc Af Amer: 51 mL/min — ABNORMAL LOW (ref >60)
GFR calc non Af Amer: 44 mL/min — ABNORMAL LOW (ref >60)
GLUCOSE: 116 mg/dL — AB (ref 70–99)
POTASSIUM: 3.8 mmol/L (ref 3.5–5.1)
SODIUM: 138 mmol/L (ref 135–145)

## 2018-03-31 MED ORDER — DICLOFENAC SODIUM 1 % TD GEL
4.0000 g | Freq: Four times a day (QID) | TRANSDERMAL | Status: DC
  Administered 2018-03-31 – 2018-04-03 (×11): 4 g via TOPICAL
  Filled 2018-03-31: qty 100

## 2018-03-31 NOTE — NC FL2 (Deleted)
[place] MEDICAID FL2 LEVEL OF CARE SCREENING TOOL     IDENTIFICATION  Patient Name: Denise Underwood Birthdate: 05/04/1946 Sex: female Admission Date (Current Location): 03/29/2018  [facility] [place]   Facility and Address:  [facility],  [address], [place] [id number]      Provider Number: [id number]  Attending Physician Name and Address:  Erick BlinksMemon, Jehanzeb, MD  Relative Name and Phone Number:       Current Level of Care: Hospital Recommended Level of Care: Skilled Nursing Facility Prior Approval Number:    Date Approved/Denied:   PASRR Number: [id number]  Discharge Plan: SNF    Current Diagnoses: Patient Active Problem List   Diagnosis Date Noted  . Groin/inguinal and infra-mammary Area Candida intertrigo with superimposed cellulitis 03/29/2018  . Candidiasis of skin/Groin/inguinal and infra-mammary Area Candida intertrigo with superimposed cellulitis 03/29/2018  . Candidal intertrigo/Groin/inguinal and infra-mammary Area Candida intertrigo with superimposed cellulitis 03/29/2018  . Falls 03/29/2018  . Hypokalemia 03/29/2018  . Tinea corporis 11/19/2017  . Primary osteoarthritis of both knees 09/05/2017  . Leukocytosis 05/26/2017  . Hypertension 04/28/2017  . Psoriasis 04/28/2017  . Hyperlipidemia 04/28/2017  . GAD (generalized anxiety disorder) 04/28/2017    Orientation RESPIRATION BLADDER Height & Weight     Self, Time, Situation, Place  O2(2L) Continent Weight: 193 lb 5.5 oz (87.7 kg) Height:     BEHAVIORAL SYMPTOMS/MOOD NEUROLOGICAL BOWEL NUTRITION STATUS      Continent (heart healthy)  AMBULATORY STATUS COMMUNICATION OF NEEDS Skin   Extensive Assist   (Groin/inguinal and infra-mammary Area Candida intertrigo with superimposed cellulitis)                       Personal Care Assistance Level of Assistance  Bathing, Feeding, Dressing Bathing Assistance: Limited assistance Feeding assistance:  Independent Dressing Assistance: Limited assistance     Functional Limitations Info  Sight, Hearing, Speech Sight Info: Adequate Hearing Info: Adequate Speech Info: Adequate    SPECIAL CARE FACTORS FREQUENCY  PT (By licensed PT)     PT Frequency: 5x/week              Contractures Contractures Info: Not present    Additional Factors Info  Code Status, Allergies, Psychotropic Code Status Info: Full Code Allergies Info: NKA Psychotropic Info: Xanax, Celexa         Current Medications (03/31/2018):  This is the current hospital active medication list Current Facility-Administered Medications  Medication Dose Route Frequency Provider Last Rate Last Dose  . 0.9 %  sodium chloride infusion   Intravenous PRN Shon HaleEmokpae, Courage, MD 10 mL/hr at 03/29/18 2136    . 0.9 %  sodium chloride infusion  250 mL Intravenous PRN Emokpae, Courage, MD      . 0.9 %  sodium chloride infusion   Intravenous Continuous Erick BlinksMemon, Jehanzeb, MD 100 mL/hr at 03/31/18 0526    . acetaminophen (TYLENOL) tablet 650 mg  650 mg Oral Q6H PRN Emokpae, Courage, MD       Or  . acetaminophen (TYLENOL) suppository 650 mg  650 mg Rectal Q6H PRN Emokpae, Courage, MD      . albuterol (PROVENTIL) (2.5 MG/3ML) 0.083% nebulizer solution 2.5 mg  2.5 mg Nebulization Q2H PRN Emokpae, Courage, MD      . ALPRAZolam Prudy Feeler(XANAX) tablet 0.5 mg  0.5 mg Oral TID PRN Erick BlinksMemon, Jehanzeb, MD   0.5 mg at 03/31/18 1346  . cefTRIAXone (ROCEPHIN) 1 g in sodium chloride 0.9 % 100 mL  IVPB  1 g Intravenous Q24H Shon Hale, MD   Stopped at 03/30/18 2147  . citalopram (CELEXA) tablet 20 mg  20 mg Oral Daily Emokpae, Courage, MD   20 mg at 03/31/18 1006  . diclofenac sodium (VOLTAREN) 1 % transdermal gel 4 g  4 g Topical QID Erick Blinks, MD      . fluconazole (DIFLUCAN) tablet 150 mg  150 mg Oral Daily Erick Blinks, MD   150 mg at 03/31/18 1006  . heparin injection 5,000 Units  5,000 Units Subcutaneous Q8H Emokpae, Courage, MD   5,000 Units  at 03/31/18 1339  . hydrALAZINE (APRESOLINE) injection 10 mg  10 mg Intravenous Q6H PRN Emokpae, Courage, MD      . ipratropium-albuterol (DUONEB) 0.5-2.5 (3) MG/3ML nebulizer solution 3 mL  3 mL Nebulization TID Shon Hale, MD   3 mL at 03/31/18 1610  . nystatin (MYCOSTATIN/NYSTOP) topical powder   Topical TID Erick Blinks, MD      . ondansetron (ZOFRAN) tablet 4 mg  4 mg Oral Q6H PRN Emokpae, Courage, MD       Or  . ondansetron (ZOFRAN) injection 4 mg  4 mg Intravenous Q6H PRN Emokpae, Courage, MD      . oxyCODONE (Oxy IR/ROXICODONE) immediate release tablet 5 mg  5 mg Oral Q4H PRN Mariea Clonts, Courage, MD   5 mg at 03/31/18 1339  . polyethylene glycol (MIRALAX / GLYCOLAX) packet 17 g  17 g Oral Daily PRN Emokpae, Courage, MD      . rosuvastatin (CRESTOR) tablet 20 mg  20 mg Oral q1800 Emokpae, Courage, MD   20 mg at 03/30/18 1734  . sodium chloride flush (NS) 0.9 % injection 3 mL  3 mL Intravenous Q12H Emokpae, Courage, MD   3 mL at 03/31/18 1007  . sodium chloride flush (NS) 0.9 % injection 3 mL  3 mL Intravenous PRN Emokpae, Courage, MD      . traMADol (ULTRAM) tablet 50 mg  50 mg Oral Q6H PRN Mariea Clonts, Courage, MD   50 mg at 03/29/18 2151  . traZODone (DESYREL) tablet 50 mg  50 mg Oral QHS PRN Shon Hale, MD         Discharge Medications: Please see discharge summary for a list of discharge medications.  Relevant Imaging Results:  Relevant Lab Results:   Additional Information SSN [id number], Juleen China, LCSW

## 2018-03-31 NOTE — NC FL2 (Signed)
[place] MEDICAID FL2 LEVEL OF CARE SCREENING TOOL     IDENTIFICATION  Patient Name: Denise Underwood Birthdate: [birth year] Sex: female Admission Date (Current Location): 03/29/2018  [place] Number:  Aaron Edelman [id number] P Facility and Address:  [facility],  [address], [place] [address]      Provider Number: [id number]  Attending Physician Name and Address:  Erick Blinks, MD  Relative Name and Phone Number:       Current Level of Care: Hospital Recommended Level of Care: Skilled Nursing Facility Prior Approval Number:    Date Approved/Denied:   PASRR Number: [id number]  Discharge Plan: SNF    Current Diagnoses: Patient Active Problem List   Diagnosis Date Noted  . Groin/inguinal and infra-mammary Area Candida intertrigo with superimposed cellulitis 03/29/2018  . Candidiasis of skin/Groin/inguinal and infra-mammary Area Candida intertrigo with superimposed cellulitis 03/29/2018  . Candidal intertrigo/Groin/inguinal and infra-mammary Area Candida intertrigo with superimposed cellulitis 03/29/2018  . Falls 03/29/2018  . Hypokalemia 03/29/2018  . Tinea corporis 11/19/2017  . Primary osteoarthritis of both knees 09/05/2017  . Leukocytosis 05/26/2017  . Hypertension 04/28/2017  . Psoriasis 04/28/2017  . Hyperlipidemia 04/28/2017  . GAD (generalized anxiety disorder) 04/28/2017    Orientation RESPIRATION BLADDER Height & Weight     Self, Time, Situation, Place  O2(2L) Continent Weight: 193 lb 5.5 oz (87.7 kg) Height:     BEHAVIORAL SYMPTOMS/MOOD NEUROLOGICAL BOWEL NUTRITION STATUS      Continent (heart healthy)  AMBULATORY STATUS COMMUNICATION OF NEEDS Skin   Extensive Assist   (Groin/inguinal and infra-mammary Area Candida intertrigo with superimposed cellulitis)                       Personal Care Assistance Level of Assistance  Bathing, Feeding, Dressing Bathing Assistance: Limited assistance Feeding assistance:  Independent Dressing Assistance: Limited assistance     Functional Limitations Info  Sight, Hearing, Speech Sight Info: Adequate Hearing Info: Adequate Speech Info: Adequate    SPECIAL CARE FACTORS FREQUENCY  PT (By licensed PT)     PT Frequency: 5x/week              Contractures Contractures Info: Not present    Additional Factors Info  Code Status, Allergies, Psychotropic Code Status Info: Full Code Allergies Info: NKA Psychotropic Info: Xanax, Celexa         Current Medications (03/31/2018):  This is the current hospital active medication list Current Facility-Administered Medications  Medication Dose Route Frequency Provider Last Rate Last Dose  . 0.9 %  sodium chloride infusion   Intravenous PRN Shon Hale, MD 10 mL/hr at 03/29/18 2136    . 0.9 %  sodium chloride infusion  250 mL Intravenous PRN Emokpae, Courage, MD      . 0.9 %  sodium chloride infusion   Intravenous Continuous Erick Blinks, MD 100 mL/hr at 03/31/18 0526    . acetaminophen (TYLENOL) tablet 650 mg  650 mg Oral Q6H PRN Emokpae, Courage, MD       Or  . acetaminophen (TYLENOL) suppository 650 mg  650 mg Rectal Q6H PRN Emokpae, Courage, MD      . albuterol (PROVENTIL) (2.5 MG/3ML) 0.083% nebulizer solution 2.5 mg  2.5 mg Nebulization Q2H PRN Emokpae, Courage, MD      . ALPRAZolam Prudy Feeler) tablet 0.5 mg  0.5 mg Oral TID PRN Erick Blinks, MD   0.5 mg at 03/31/18 1346  . cefTRIAXone (ROCEPHIN) 1 g in sodium chloride 0.9 % 100 mL IVPB  1 g Intravenous Q24H Shon HaleEmokpae, Courage, MD   Stopped at 03/30/18 2147  . citalopram (CELEXA) tablet 20 mg  20 mg Oral Daily Emokpae, Courage, MD   20 mg at 03/31/18 1006  . diclofenac sodium (VOLTAREN) 1 % transdermal gel 4 g  4 g Topical QID Erick BlinksMemon, Jehanzeb, MD      . fluconazole (DIFLUCAN) tablet 150 mg  150 mg Oral Daily Erick BlinksMemon, Jehanzeb, MD   150 mg at 03/31/18 1006  . heparin injection 5,000 Units  5,000 Units Subcutaneous Q8H Emokpae, Courage, MD   5,000 Units  at 03/31/18 1339  . hydrALAZINE (APRESOLINE) injection 10 mg  10 mg Intravenous Q6H PRN Emokpae, Courage, MD      . ipratropium-albuterol (DUONEB) 0.5-2.5 (3) MG/3ML nebulizer solution 3 mL  3 mL Nebulization TID Shon HaleEmokpae, Courage, MD   3 mL at 03/31/18 [id number]  . nystatin (MYCOSTATIN/NYSTOP) topical powder   Topical TID Erick BlinksMemon, Jehanzeb, MD      . ondansetron (ZOFRAN) tablet 4 mg  4 mg Oral Q6H PRN Emokpae, Courage, MD       Or  . ondansetron (ZOFRAN) injection 4 mg  4 mg Intravenous Q6H PRN Emokpae, Courage, MD      . oxyCODONE (Oxy IR/ROXICODONE) immediate release tablet 5 mg  5 mg Oral Q4H PRN Mariea ClontsEmokpae, Courage, MD   5 mg at 03/31/18 1339  . polyethylene glycol (MIRALAX / GLYCOLAX) packet 17 g  17 g Oral Daily PRN Emokpae, Courage, MD      . rosuvastatin (CRESTOR) tablet 20 mg  20 mg Oral q1800 Emokpae, Courage, MD   20 mg at 03/30/18 1734  . sodium chloride flush (NS) 0.9 % injection 3 mL  3 mL Intravenous Q12H Emokpae, Courage, MD   3 mL at 03/31/18 1007  . sodium chloride flush (NS) 0.9 % injection 3 mL  3 mL Intravenous PRN Emokpae, Courage, MD      . traMADol (ULTRAM) tablet 50 mg  50 mg Oral Q6H PRN Mariea ClontsEmokpae, Courage, MD   50 mg at 03/29/18 2151  . traZODone (DESYREL) tablet 50 mg  50 mg Oral QHS PRN Shon HaleEmokpae, Courage, MD         Discharge Medications: Please see discharge summary for a list of discharge medications.  Relevant Imaging Results:  Relevant Lab Results:   Additional Information SSN [id number]  Julieta Rogalski, Juleen ChinaHeather D, LCSW

## 2018-03-31 NOTE — Clinical Social Work Note (Signed)
[employer] authorization started by [employer] JohnsonLCSW.    Rayel Santizo, Juleen ChinaHeather D, LCSW

## 2018-03-31 NOTE — Progress Notes (Signed)
Physical Therapy Treatment Patient Details Name: Denise Underwood MRN: [id number] DOB: 08/02/1945 Today's Date: 03/31/2018    History of Present Illness Denise Underwood  is a 72 y.o. female past medical history relevant for COPD, tobacco abuse, chronic leukocytosis, psoriasis and recurrent candidal intertrigo of the groin/inguinal area and inframammary areas who presents to the ED with generalized weakness and fatigue as well as recurrent falls at home    PT Comments    Patient less assistance for sitting up at bedside and sit to stands, demonstrates increased endurance/distance for taking steps at bedside and has less c/o bilateral ankle/foot pain when weight bearing during transfers and gait training.  Patient tolerated sitting up at bedside maintaining good sitting balance while completing BLE ROM/strengthening exercises, on room air throughout treatment with O2 saturation between 92-96% and left on room air after therapy - RN notified.  Patient tolerated sitting up in chair after therapy with family members present at bedside.  Patient will benefit from continued physical therapy in hospital and recommended venue below to increase strength, balance, endurance for safe ADLs and gait.    Follow Up Recommendations  SNF;Supervision/Assistance - 24 hour;Supervision for mobility/OOB     Equipment Recommendations  None recommended by PT    Recommendations for Other Services       Precautions / Restrictions Precautions Precautions: Fall Restrictions Weight Bearing Restrictions: No    Mobility  Bed Mobility Overal bed mobility: Needs Assistance Bed Mobility: Supine to Sit     Supine to sit: Min assist;Mod assist     General bed mobility comments: slightly labored movement  Transfers Overall transfer level: Needs assistance Equipment used: Rolling walker (2 wheeled) Transfers: Sit to/from [employer]Stand;Stand Pivot Transfers Sit to Stand: Mod assist;Min assist Stand pivot transfers: Mod  assist       General transfer comment: required verbal cues for proper body mechanics for sit to stands resulting in less assistance  Ambulation/Gait Ambulation/Gait assistance: Mod assist;Max assist Gait Distance (Feet): 6 Feet Assistive device: Rolling walker (2 wheeled) Gait Pattern/deviations: Decreased step length - right;Decreased step length - left;Decreased stride length Gait velocity: slow   General Gait Details: demonstrates increased endurance/distance for taking steps at bedside, able to take 7-8 steps before having to sit in rest, on room air with O2 saturation at 94-96%   Stairs             Wheelchair Mobility    Modified Rankin (Stroke Patients Only)       Balance Overall balance assessment: Needs assistance Sitting-balance support: Feet supported;No upper extremity supported Sitting balance-Leahy Scale: Fair     Standing balance support: Bilateral upper extremity supported;During functional activity Standing balance-Leahy Scale: Fair Standing balance comment: fair/poor with RW                            Cognition Arousal/Alertness: Awake/alert Behavior During Therapy: WFL for tasks assessed/performed Overall Cognitive Status: Within Functional Limits for tasks assessed                                        Exercises General Exercises - Lower Extremity Long Arc Quad: Seated;AROM;Strengthening;Both;10 reps Hip Flexion/Marching: Seated;AROM;Strengthening;Both;10 reps Toe Raises: Seated;AROM;Strengthening;10 reps;Both Heel Raises: Seated;AROM;Strengthening;Both;10 reps    General Comments        Pertinent Vitals/Pain Pain Assessment: Faces Faces Pain Scale: Hurts a little bit Pain Location:  bilateral ankles Pain Descriptors / Indicators: Sore;Discomfort Pain Intervention(s): Limited activity within patient's tolerance;Monitored during session    Home Living                      Prior Function             PT Goals (current goals can now be found in the care plan section) Acute Rehab PT Goals Patient Stated Goal: return home after rehab PT Goal Formulation: With patient/family Time For Goal Achievement: 04/13/18 Potential to Achieve Goals: Good Progress towards PT goals: Progressing toward goals    Frequency    Min 3X/week      PT Plan Current plan remains appropriate    Co-evaluation              AM-PAC PT "6 Clicks" Daily Activity  Outcome Measure  Difficulty turning over in bed (including adjusting bedclothes, sheets and blankets)?: Unable Difficulty moving from lying on back to sitting on the side of the bed? : Unable Difficulty sitting down on and standing up from a chair with arms (e.g., wheelchair, bedside commode, etc,.)?: Unable Help needed moving to and from a bed to chair (including a wheelchair)?: A Lot Help needed walking in hospital room?: A Lot Help needed climbing 3-5 steps with a railing? : Total 6 Click Score: 8    End of Session Equipment Utilized During Treatment: Gait belt Activity Tolerance: Patient tolerated treatment well;Patient limited by fatigue Patient left: in chair;with call bell/phone within reach;with chair alarm set;with family/visitor present Nurse Communication: Mobility status PT Visit Diagnosis: Unsteadiness on feet (R26.81);Other abnormalities of gait and mobility (R26.89);Muscle weakness (generalized) (M62.81)     Time: 9604-5409 PT Time Calculation (min) (ACUTE ONLY): 35 min  Charges:  $Therapeutic Exercise: 8-22 mins $Therapeutic Activity: 8-22 mins                     12:14 PM, 03/31/18 Ocie Bob, MPT Physical Therapist with [facility] 336 [phone] office [phone] mobile phone

## 2018-03-31 NOTE — Clinical Social Work Note (Signed)
Patient has reconsidered her decision and now wants to go to SNF. She and daughters provided SNF choices. LCSW to complete SNF referral process.    Ancelmo Hunt, Juleen ChinaHeather D, LCSW

## 2018-03-31 NOTE — Care Management Note (Signed)
Case Management Note  Patient Details  Name: Valetta FullerFrances S Pimenta MRN: [id number] Date of Birth: 12/21/1945  Subjective/Objective:                    Action/Plan: Patient has now elected to go to SNF as recommended. CSW aware and making arrangements. CM will sign off.    Expected Discharge Date:    04/01/2018              Expected Discharge Plan:  Skilled Nursing Facility  In-House Referral:  Clinical Social Work  Discharge planning Services  CM Consult  Post Acute Care Choice:  NA Choice offered to:  NA  DME Arranged:    DME Agency:     HH Arranged:    HH Agency:     Status of Service:  Completed, signed off  If discussed at [employer] Meetings, dates discussed:    Additional Comments:  Karina Nofsinger, Chrystine OilerSharley Diane, RN 03/31/2018, 11:04 AM

## 2018-03-31 NOTE — Progress Notes (Signed)
PROGRESS NOTE    Denise FullerFrances S Naim  WUX:[id number] DOB: 02/28/1946 DOA: 03/29/2018 PCP: Caryl NeverJones, Angel S, PA-C    Brief Narrative:  72 year old female with a history of COPD chronic leukocytosis, psoriasis, is brought to the hospital by family with generalized weakness.  She has had several falls in the past few weeks.  They also noted that she had significant cellulitis/intertrigo in her skin folds.  She was started on intravenous antibiotics and antifungal agents.  She was noted to be hypokalemic and mildly dehydrated.  Electrolytes are being replaced.  She was admitted for further treatment.   Assessment & Plan:   Principal Problem:   Groin/inguinal and infra-mammary Area Candida intertrigo with superimposed cellulitis Active Problems:   Hypertension   Leukocytosis   Tinea corporis   Candidiasis of skin/Groin/inguinal and infra-mammary Area Candida intertrigo with superimposed cellulitis   Candidal intertrigo/Groin/inguinal and infra-mammary Area Candida intertrigo with superimposed cellulitis   Falls   Hypokalemia   1. Candida intertrigo with superimposed cellulitis.  Currently on Diflucan, nystatin powder as well as intravenous ceftriaxone. Slowly improving. Continue current treatments. 2. Leukocytosis.  Likely elevated due to infection, although she does have a chronic leukocytosis.  Continue to monitor. Leukocytosis appears to be back to baseline 3. Hypokalemia/hypomagnesemia.  Replaced.  Hold hydrochlorothiazide. 4. Generalized weakness.  Seen by physical therapy recommended skilled nursing facility placement.   5. Right ankle pain.  Patient reports this occurred after a fall.  She has increased pain on range of motion.  Xrays do not show any fracture or dislocation. Likely ankle sprain, treat supportively 6. COPD.  No shortness of breath or wheezing at this time.  Continue home medications. 7. Hypertension.  Hydrochlorothiazide currently on hold.  Continue to follow blood pressure  since this is running low at this time.   8. psoriasis.  No evidence of flares at this time.   DVT prophylaxis: Heparin Code Status: Full code Family Communication: Discussed with daughters at the bedside Disposition Plan: patient agreeable for SNF placement   Consultants:     Procedures:     Antimicrobials:   Ceftriaxone 11/17>   Subjective: Feeling better. Still has pain in right foot. Feels erythema is improving.  Objective: Vitals:   03/31/18 0552 03/31/18 0812 03/31/18 1432 03/31/18 1456  BP: 125/80  (!) 99/57   Pulse: (!) 109  99   Resp:   19   Temp: 99.7 F (37.6 C)  98.5 F (36.9 C)   TempSrc: Oral  Oral   SpO2: 94% 92% 92% 92%  Weight:        Intake/Output Summary (Last 24 hours) at 03/31/2018 1627 Last data filed at 03/31/2018 1500 Gross per 24 hour  Intake 3636.86 ml  Output 600 ml  Net 3036.86 ml   Filed Weights   03/29/18 1451 03/29/18 2135  Weight: 82.3 kg 87.7 kg    Examination:  General exam: Alert, awake, oriented x 3 Respiratory system: Clear to auscultation. Respiratory effort normal. Cardiovascular system:RRR. No murmurs, rubs, gallops. Gastrointestinal system: Abdomen is nondistended, soft and nontender. No organomegaly or masses felt. Normal bowel sounds heard. Central nervous system: Alert and oriented. No focal neurological deficits. Extremities: No C/C/E, +pedal pulses Skin: erythema in groin folds and under breast line appears to be improving Psychiatry: Judgement and insight appear normal. Mood & affect appropriate.      Data Reviewed: I have personally reviewed following labs and imaging studies  CBC: Recent Labs  Lab 03/29/18 1540 03/30/18 0504 03/31/18 0538  WBC  20.5* 16.6* 15.3*  NEUTROABS 15.4*  --   --   HGB 12.7 10.7* 10.8*  HCT 40.0 35.1* 36.2  MCV 102.0* 102.9* 104.0*  PLT 340 298 300   Basic Metabolic Panel: Recent Labs  Lab 03/29/18 1540 03/29/18 1934 03/30/18 0504 03/31/18 0538  NA 136  --   139 138  K 2.9*  --  3.6 3.8  CL 91*  --  96* 101  CO2 35*  --  35* 28  GLUCOSE 115*  --  93 116*  BUN 16  --  17 14  CREATININE 1.23*  --  1.18* 1.20*  CALCIUM 9.1  --  8.5* 8.2*  MG  --  1.2* 1.7  --    GFR: Estimated Creatinine Clearance: 44.8 mL/min (A) (by C-G formula based on SCr of 1.2 mg/dL (H)). Liver Function Tests: Recent Labs  Lab 03/29/18 1540  AST 19  ALT 10  ALKPHOS 48  BILITOT 1.1  PROT 7.1  ALBUMIN 3.0*   No results for input(s): LIPASE, AMYLASE in the last 168 hours. No results for input(s): AMMONIA in the last 168 hours. Coagulation Profile: No results for input(s): INR, PROTIME in the last 168 hours. Cardiac Enzymes: No results for input(s): CKTOTAL, CKMB, CKMBINDEX, TROPONINI in the last 168 hours. BNP (last 3 results) No results for input(s): PROBNP in the last 8760 hours. HbA1C: No results for input(s): HGBA1C in the last 72 hours. CBG: No results for input(s): GLUCAP in the last 168 hours. Lipid Profile: No results for input(s): CHOL, HDL, LDLCALC, TRIG, CHOLHDL, LDLDIRECT in the last 72 hours. Thyroid Function Tests: No results for input(s): TSH, T4TOTAL, FREET4, T3FREE, THYROIDAB in the last 72 hours. Anemia Panel: No results for input(s): VITAMINB12, FOLATE, FERRITIN, TIBC, IRON, RETICCTPCT in the last 72 hours. Sepsis Labs: No results for input(s): PROCALCITON, LATICACIDVEN in the last 168 hours.  Recent Results (from the past 240 hour(s))  Culture, blood (Routine X 2) w Reflex to ID Panel     Status: None (Preliminary result)   Collection Time: 03/29/18  3:36 PM  Result Value Ref Range Status   Specimen Description LEFT ANTECUBITAL  Final   Special Requests   Final    BOTTLES DRAWN AEROBIC AND ANAEROBIC Blood Culture results may not be optimal due to an excessive volume of blood received in culture bottles   Culture   Final    NO GROWTH 2 DAYS Performed at [facility], [address], [place], [place] [address]    Report Status  PENDING  Incomplete  Culture, blood (Routine X 2) w Reflex to ID Panel     Status: None (Preliminary result)   Collection Time: 03/29/18  7:34 PM  Result Value Ref Range Status   Specimen Description RIGHT ANTECUBITAL  Final   Special Requests   Final    BOTTLES DRAWN AEROBIC AND ANAEROBIC Blood Culture adequate volume   Culture   Final    NO GROWTH 2 DAYS Performed at [facility], [address] [place], [place] [address]    Report Status PENDING  Incomplete         Radiology Studies: Dg Foot Complete Right  Result Date: 03/30/2018 CLINICAL DATA:  Foot pain. EXAM: RIGHT FOOT COMPLETE - 3+ VIEW COMPARISON:  None. FINDINGS: The bones appear osteopenic. There are no acute fractures or dislocations identified. Plantar heel spur noted. Mild diffuse soft tissue edema. IMPRESSION: 1. No acute bone abnormality. 2. Mild soft tissue edema. 3. Plantar heel spur Electronically Signed  By: Signa Kell M.D.   On: 03/30/2018 16:20        Scheduled Meds: . citalopram  20 mg Oral Daily  . diclofenac sodium  4 g Topical QID  . fluconazole  150 mg Oral Daily  . heparin  5,000 Units Subcutaneous Q8H  . nystatin   Topical TID  . rosuvastatin  20 mg Oral q1800  . sodium chloride flush  3 mL Intravenous Q12H   Continuous Infusions: . sodium chloride 10 mL/hr at 03/29/18 2136  . sodium chloride    . sodium chloride 100 mL/hr at 03/31/18 0526  . cefTRIAXone (ROCEPHIN)  IV Stopped (03/30/18 2147)     LOS: 0 days    Time spent:    Erick Blinks, MD [facility] Pager [phone]  If 7PM-7AM, please contact night-coverage [email or web address] Password [facility] 03/31/2018, 4:27 PM

## 2018-04-01 NOTE — Progress Notes (Signed)
Physical Therapy Treatment Patient Details Name: Denise Underwood MRN: [id number] DOB: May 12, 1946 Today's Date: 04/01/2018    History of Present Illness Denise Underwood  is a 72 y.o. female past medical history relevant for COPD, tobacco abuse, chronic leukocytosis, psoriasis and recurrent candidal intertrigo of the groin/inguinal area and inframammary areas who presents to the ED with generalized weakness and fatigue as well as recurrent falls at home    PT Comments    Patient demonstrates improvement for sitting up at bedside using bed rail requiring less assistance, increased endurance/distance for gait training without loss of balance, required repeated verbal/tactile cueing for proper hand placement during sit to stands and tolerated sitting up in chair after therapy with family members in room.  Patient will benefit from continued physical therapy in hospital and recommended venue below to increase strength, balance, endurance for safe ADLs and gait.    Follow Up Recommendations  SNF;Supervision/Assistance - 24 hour;Supervision for mobility/OOB     Equipment Recommendations  None recommended by PT    Recommendations for Other Services       Precautions / Restrictions Precautions Precautions: Fall Restrictions Weight Bearing Restrictions: No    Mobility  Bed Mobility Overal bed mobility: Needs Assistance Bed Mobility: Supine to Sit     Supine to sit: Min guard;Min assist     General bed mobility comments: slightly labored movement, required less assistance  Transfers Overall transfer level: Needs assistance Equipment used: Rolling walker (2 wheeled) Transfers: Sit to/from [employer] Sit to Stand: Min assist;Mod assist Stand pivot transfers: Min assist;Mod assist       General transfer comment: verbal cues for proper body mechanics and hand placement with sit to stands  Ambulation/Gait Ambulation/Gait assistance: Mod assist Gait Distance (Feet):  25 Feet Assistive device: Rolling walker (2 wheeled) Gait Pattern/deviations: Decreased step length - right;Decreased step length - left;Decreased stride length Gait velocity: slow   General Gait Details: demonstrates increased endurance/distance for ambulation with slow labored cadence without loss of balance, limited secondary to fatigue   Stairs             Wheelchair Mobility    Modified Rankin (Stroke Patients Only)       Balance Overall balance assessment: Needs assistance Sitting-balance support: Feet supported;No upper extremity supported Sitting balance-Leahy Scale: Good     Standing balance support: Bilateral upper extremity supported;During functional activity Standing balance-Leahy Scale: Fair Standing balance comment: using RW                            Cognition Arousal/Alertness: Awake/alert Behavior During Therapy: WFL for tasks assessed/performed Overall Cognitive Status: Within Functional Limits for tasks assessed                                        Exercises General Exercises - Lower Extremity Long Arc Quad: Seated;AROM;Strengthening;Both;10 reps Hip Flexion/Marching: Seated;AROM;Strengthening;Both;10 reps Toe Raises: Seated;AROM;Strengthening;10 reps;Both Heel Raises: Seated;AROM;Strengthening;Both;10 reps    General Comments        Pertinent Vitals/Pain Pain Assessment: Faces Faces Pain Scale: Hurts a little bit Pain Location: bilateral ankles Pain Descriptors / Indicators: Sore;Discomfort Pain Intervention(s): Limited activity within patient's tolerance;Monitored during session    Home Living                      Prior Function  PT Goals (current goals can now be found in the care plan section) Acute Rehab PT Goals Patient Stated Goal: return home after rehab PT Goal Formulation: With patient/family Time For Goal Achievement: 04/13/18 Potential to Achieve Goals: Good Progress  towards PT goals: Progressing toward goals    Frequency    Min 3X/week      PT Plan Current plan remains appropriate    Co-evaluation              AM-PAC PT "6 Clicks" Daily Activity  Outcome Measure  Difficulty turning over in bed (including adjusting bedclothes, sheets and blankets)?: A Little(required help to move bed sheet/covers) Difficulty moving from lying on back to sitting on the side of the bed? : A Little(had to use siderail) Difficulty sitting down on and standing up from a chair with arms (e.g., wheelchair, bedside commode, etc,.)?: Unable Help needed moving to and from a bed to chair (including a wheelchair)?: A Lot Help needed walking in hospital room?: A Lot Help needed climbing 3-5 steps with a railing? : Total 6 Click Score: 12    End of Session Equipment Utilized During Treatment: Gait belt Activity Tolerance: Patient tolerated treatment well;Patient limited by fatigue Patient left: in chair;with call bell/phone within reach;with chair alarm set;with family/visitor present Nurse Communication: Mobility status PT Visit Diagnosis: Unsteadiness on feet (R26.81);Other abnormalities of gait and mobility (R26.89);Muscle weakness (generalized) (M62.81)     Time: [id number] PT Time Calculation (min) (ACUTE ONLY): 31 min  Charges:  $Therapeutic Exercise: 8-22 mins $Therapeutic Activity: 8-22 mins                     2:09 PM, 04/01/18 Ocie BobJames Daizee Firmin, MPT [facility] Therapist [facility] [phone] office [phone] mobile phone

## 2018-04-01 NOTE — Progress Notes (Signed)
PROGRESS NOTE    Denise Underwood  WUJ:[id number] DOB: 07/19/1945 DOA: 03/29/2018 PCP: Caryl Never    Brief Narrative:  72 year old female with a history of COPD chronic leukocytosis, psoriasis, is brought to the hospital by family with generalized weakness.  She has had several falls in the past few weeks.  They also noted that she had significant cellulitis/intertrigo in her skin folds.  She was started on intravenous antibiotics and antifungal agents.  She was noted to be hypokalemic and mildly dehydrated.  Electrolytes are being replaced.  She was admitted for further treatment.   Assessment & Plan:   Principal Problem:   Groin/inguinal and infra-mammary Area Candida intertrigo with superimposed cellulitis Active Problems:   Hypertension   Leukocytosis   Tinea corporis   Candidiasis of skin/Groin/inguinal and infra-mammary Area Candida intertrigo with superimposed cellulitis   Candidal intertrigo/Groin/inguinal and infra-mammary Area Candida intertrigo with superimposed cellulitis   Falls   Hypokalemia   1. Candida intertrigo with superimposed cellulitis.  Currently on Diflucan, nystatin powder as well as intravenous ceftriaxone.  Continues to slowly improve. 2. Leukocytosis.  Likely elevated due to infection, although she does have a chronic leukocytosis.  Continue to monitor. Leukocytosis appears to be back to baseline 3. Hypokalemia/hypomagnesemia.  Replaced.  Hold hydrochlorothiazide. 4. Generalized weakness.  Seen by physical therapy recommended skilled nursing facility placement.   5. Right ankle pain.  Patient reports this occurred after a fall.  She has increased pain on range of motion.  Xrays do not show any fracture or dislocation. Likely ankle sprain, treat supportively.  Clinically improving 6. COPD.  No shortness of breath or wheezing at this time.  Continue home medications. 7. Hypertension.  Hydrochlorothiazide currently on hold.  Continue to follow blood  pressure since this is running low at this time.   8. psoriasis.  No evidence of flares at this time.   DVT prophylaxis: Heparin Code Status: Full code Family Communication: No family present today Disposition Plan: Awaiting SNF placement   Consultants:     Procedures:     Antimicrobials:   Ceftriaxone 11/17>   Subjective: Less pain in her right foot.  Has increased range of motion in her right ankle.  Feels the erythema in her skin folds is improving  Objective: Vitals:   03/31/18 2111 04/01/18 0654 04/01/18 0654 04/01/18 1433  BP: (!) 117/51 (!) 91/47 (!) 91/47 103/71  Pulse: 78 88 89 76  Resp:      Temp: 98.9 F (37.2 C) 99.1 F (37.3 C) 99.1 F (37.3 C) 98.3 F (36.8 C)  TempSrc: Oral Oral Oral Oral  SpO2: 94% 93% 95% 95%  Weight:        Intake/Output Summary (Last 24 hours) at 04/01/2018 1756 Last data filed at 04/01/2018 1700 Gross per 24 hour  Intake 2221.95 ml  Output 1950 ml  Net 271.95 ml   Filed Weights   03/29/18 1451 03/29/18 2135  Weight: 82.3 kg 87.7 kg    Examination:  General exam: Alert, awake, oriented x 3 Respiratory system: Clear to auscultation. Respiratory effort normal. Cardiovascular system:RRR. No murmurs, rubs, gallops. Gastrointestinal system: Abdomen is nondistended, soft and nontender. No organomegaly or masses felt. Normal bowel sounds heard. Central nervous system: Alert and oriented. No focal neurological deficits. Extremities: No C/C/E, +pedal pulses Skin: Erythema in groin folds and under breasts line overall improving Psychiatry: Judgement and insight appear normal. Mood & affect appropriate.      Data Reviewed: I have personally reviewed following  labs and imaging studies  CBC: Recent Labs  Lab 03/29/18 1540 03/30/18 0504 03/31/18 0538  WBC 20.5* 16.6* 15.3*  NEUTROABS 15.4*  --   --   HGB 12.7 10.7* 10.8*  HCT 40.0 35.1* 36.2  MCV 102.0* 102.9* 104.0*  PLT 340 298 300   Basic Metabolic  Panel: Recent Labs  Lab 03/29/18 1540 03/29/18 1934 03/30/18 0504 03/31/18 0538  NA 136  --  139 138  K 2.9*  --  3.6 3.8  CL 91*  --  96* 101  CO2 35*  --  35* 28  GLUCOSE 115*  --  93 116*  BUN 16  --  17 14  CREATININE 1.23*  --  1.18* 1.20*  CALCIUM 9.1  --  8.5* 8.2*  MG  --  1.2* 1.7  --    GFR: Estimated Creatinine Clearance: 44.8 mL/min (A) (by C-G formula based on SCr of 1.2 mg/dL (H)). Liver Function Tests: Recent Labs  Lab 03/29/18 1540  AST 19  ALT 10  ALKPHOS 48  BILITOT 1.1  PROT 7.1  ALBUMIN 3.0*   No results for input(s): LIPASE, AMYLASE in the last 168 hours. No results for input(s): AMMONIA in the last 168 hours. Coagulation Profile: No results for input(s): INR, PROTIME in the last 168 hours. Cardiac Enzymes: No results for input(s): CKTOTAL, CKMB, CKMBINDEX, TROPONINI in the last 168 hours. BNP (last 3 results) No results for input(s): PROBNP in the last 8760 hours. HbA1C: No results for input(s): HGBA1C in the last 72 hours. CBG: No results for input(s): GLUCAP in the last 168 hours. Lipid Profile: No results for input(s): CHOL, HDL, LDLCALC, TRIG, CHOLHDL, LDLDIRECT in the last 72 hours. Thyroid Function Tests: No results for input(s): TSH, T4TOTAL, FREET4, T3FREE, THYROIDAB in the last 72 hours. Anemia Panel: No results for input(s): VITAMINB12, FOLATE, FERRITIN, TIBC, IRON, RETICCTPCT in the last 72 hours. Sepsis Labs: No results for input(s): PROCALCITON, LATICACIDVEN in the last 168 hours.  Recent Results (from the past 240 hour(s))  Culture, blood (Routine X 2) w Reflex to ID Panel     Status: None (Preliminary result)   Collection Time: 03/29/18  3:36 PM  Result Value Ref Range Status   Specimen Description LEFT ANTECUBITAL  Final   Special Requests   Final    BOTTLES DRAWN AEROBIC AND ANAEROBIC Blood Culture results may not be optimal due to an excessive volume of blood received in culture bottles   Culture   Final    NO GROWTH 3  DAYS Performed at [facility], [address] [place], [place] [address]    Report Status PENDING  Incomplete  Culture, blood (Routine X 2) w Reflex to ID Panel     Status: None (Preliminary result)   Collection Time: 03/29/18  7:34 PM  Result Value Ref Range Status   Specimen Description RIGHT ANTECUBITAL  Final   Special Requests   Final    BOTTLES DRAWN AEROBIC AND ANAEROBIC Blood Culture adequate volume   Culture   Final    NO GROWTH 3 DAYS Performed at [facility], [address] [place], [place] [address]    Report Status PENDING  Incomplete         Radiology Studies: No results found.      Scheduled Meds: . citalopram  20 mg Oral Daily  . diclofenac sodium  4 g Topical QID  . fluconazole  150 mg Oral Daily  . heparin  5,000 Units Subcutaneous Q8H  .  nystatin   Topical TID  . rosuvastatin  20 mg Oral q1800  . sodium chloride flush  3 mL Intravenous Q12H   Continuous Infusions: . sodium chloride 10 mL/hr at 03/29/18 2136  . sodium chloride    . sodium chloride 100 mL/hr at 04/01/18 0529  . cefTRIAXone (ROCEPHIN)  IV 1 g (03/31/18 2029)     LOS: 1 day    Time spent:    Erick Blinks, MD [facility] Pager [phone]  If 7PM-7AM, please contact night-coverage [email or web address] Password [facility] 04/01/2018, 5:56 PM

## 2018-04-02 LAB — BASIC METABOLIC PANEL
Anion gap: 6 (ref 5–15)
BUN: 10 mg/dL (ref 8–23)
CO2: 28 mmol/L (ref 22–32)
CREATININE: 0.9 mg/dL (ref 0.44–1.00)
Calcium: 8.3 mg/dL — ABNORMAL LOW (ref 8.9–10.3)
Chloride: 106 mmol/L (ref 98–111)
Glucose, Bld: 85 mg/dL (ref 70–99)
POTASSIUM: 3.2 mmol/L — AB (ref 3.5–5.1)
SODIUM: 140 mmol/L (ref 135–145)

## 2018-04-02 LAB — CBC
HCT: 33.4 % — ABNORMAL LOW (ref 36.0–46.0)
Hemoglobin: 10.3 g/dL — ABNORMAL LOW (ref 12.0–15.0)
MCH: 31.4 pg (ref 26.0–34.0)
MCHC: 30.8 g/dL (ref 30.0–36.0)
MCV: 101.8 fL — ABNORMAL HIGH (ref 80.0–100.0)
NRBC: 0 % (ref 0.0–0.2)
PLATELETS: 302 10*3/uL (ref 150–400)
RBC: 3.28 MIL/uL — AB (ref 3.87–5.11)
RDW: 13.1 % (ref 11.5–15.5)
WBC: 15.2 10*3/uL — AB (ref 4.0–10.5)

## 2018-04-02 MED ORDER — POTASSIUM CHLORIDE CRYS ER 20 MEQ PO TBCR
40.0000 meq | EXTENDED_RELEASE_TABLET | Freq: Once | ORAL | Status: AC
  Administered 2018-04-02: 40 meq via ORAL
  Filled 2018-04-02: qty 2

## 2018-04-02 NOTE — Progress Notes (Signed)
PROGRESS NOTE    Denise FullerFrances S Leaming  ZOX:[id number] DOB: 08/02/1945 DOA: 03/29/2018 PCP: Caryl NeverJones, Angel S, PA-C    Brief Narrative:  72 year old female with a history of COPD chronic leukocytosis, psoriasis, is brought to the hospital by family with generalized weakness.  She has had several falls in the past few weeks.  They also noted that she had significant cellulitis/intertrigo in her skin folds.  She was started on intravenous antibiotics and antifungal agents.  She was noted to be hypokalemic and mildly dehydrated.  Electrolytes are being replaced.  She was admitted for further treatment.   Assessment & Plan:   Principal Problem:   Groin/inguinal and infra-mammary Area Candida intertrigo with superimposed cellulitis Active Problems:   Hypertension   Leukocytosis   Tinea corporis   Candidiasis of skin/Groin/inguinal and infra-mammary Area Candida intertrigo with superimposed cellulitis   Candidal intertrigo/Groin/inguinal and infra-mammary Area Candida intertrigo with superimposed cellulitis   Falls   Hypokalemia   1. Candida intertrigo with superimposed cellulitis.  Currently on Diflucan, nystatin powder as well as intravenous ceftriaxone.  Continues to slowly improve. 2. Leukocytosis.  Likely elevated due to infection, although she does have a chronic leukocytosis.  Continue to monitor. Leukocytosis appears to be back to baseline 3. Hypokalemia/hypomagnesemia.  Replaced.  Hold hydrochlorothiazide. 4. Generalized weakness.  Seen by physical therapy recommended skilled nursing facility placement.   5. Right ankle pain.  Patient reports this occurred after a fall.  She has increased pain on range of motion.  Xrays do not show any fracture or dislocation. Likely ankle sprain, treat supportively.  Clinically improving 6. COPD.  No shortness of breath or wheezing at this time.  Continue home medications. 7. Hypertension.  Hydrochlorothiazide currently on hold.  Continue to follow blood  pressure since this is running low at this time.   8. psoriasis.  No evidence of flares at this time.   DVT prophylaxis: Heparin Code Status: Full code Family Communication: No family present today Disposition Plan: Awaiting SNF placement   Consultants:     Procedures:     Antimicrobials:   Ceftriaxone 11/17>   Subjective: Feeling better today.  Less pain in her foot.  Feels that the erythema in her skin folds is improving  Objective: Vitals:   04/01/18 2229 04/01/18 2318 04/02/18 0628 04/02/18 1428  BP: (!) 88/66 (!) 102/59 117/60 137/71  Pulse: 84  84 74  Resp: 18  18 20   Temp: 99.1 F (37.3 C)  98.4 F (36.9 C)   TempSrc: Oral  Oral   SpO2: 93%  92% 95%  Weight:        Intake/Output Summary (Last 24 hours) at 04/02/2018 1919 Last data filed at 04/02/2018 1700 Gross per 24 hour  Intake 2265.74 ml  Output 1400 ml  Net 865.74 ml   Filed Weights   03/29/18 1451 03/29/18 2135  Weight: 82.3 kg 87.7 kg    Examination:  General exam: Alert, awake, oriented x 3 Respiratory system: Clear to auscultation. Respiratory effort normal. Cardiovascular system:RRR. No murmurs, rubs, gallops. Gastrointestinal system: Abdomen is nondistended, soft and nontender. No organomegaly or masses felt. Normal bowel sounds heard. Central nervous system: Alert and oriented. No focal neurological deficits. Extremities: No C/C/E, +pedal pulses Skin: Erythema in groin and under breasts line improving Psychiatry: Judgement and insight appear normal. Mood & affect appropriate.       Data Reviewed: I have personally reviewed following labs and imaging studies  CBC: Recent Labs  Lab 03/29/18 1540 03/30/18 0504  03/31/18 0538 04/02/18 0429  WBC 20.5* 16.6* 15.3* 15.2*  NEUTROABS 15.4*  --   --   --   HGB 12.7 10.7* 10.8* 10.3*  HCT 40.0 35.1* 36.2 33.4*  MCV 102.0* 102.9* 104.0* 101.8*  PLT 340 298 300 302   Basic Metabolic Panel: Recent Labs  Lab 03/29/18 1540  03/29/18 1934 03/30/18 0504 03/31/18 0538 04/02/18 0429  NA 136  --  139 138 140  K 2.9*  --  3.6 3.8 3.2*  CL 91*  --  96* 101 106  CO2 35*  --  35* 28 28  GLUCOSE 115*  --  93 116* 85  BUN 16  --  17 14 10   CREATININE 1.23*  --  1.18* 1.20* 0.90  CALCIUM 9.1  --  8.5* 8.2* 8.3*  MG  --  1.2* 1.7  --   --    GFR: Estimated Creatinine Clearance: 59.7 mL/min (by C-G formula based on SCr of 0.9 mg/dL). Liver Function Tests: Recent Labs  Lab 03/29/18 1540  AST 19  ALT 10  ALKPHOS 48  BILITOT 1.1  PROT 7.1  ALBUMIN 3.0*   No results for input(s): LIPASE, AMYLASE in the last 168 hours. No results for input(s): AMMONIA in the last 168 hours. Coagulation Profile: No results for input(s): INR, PROTIME in the last 168 hours. Cardiac Enzymes: No results for input(s): CKTOTAL, CKMB, CKMBINDEX, TROPONINI in the last 168 hours. BNP (last 3 results) No results for input(s): PROBNP in the last 8760 hours. HbA1C: No results for input(s): HGBA1C in the last 72 hours. CBG: No results for input(s): GLUCAP in the last 168 hours. Lipid Profile: No results for input(s): CHOL, HDL, LDLCALC, TRIG, CHOLHDL, LDLDIRECT in the last 72 hours. Thyroid Function Tests: No results for input(s): TSH, T4TOTAL, FREET4, T3FREE, THYROIDAB in the last 72 hours. Anemia Panel: No results for input(s): VITAMINB12, FOLATE, FERRITIN, TIBC, IRON, RETICCTPCT in the last 72 hours. Sepsis Labs: No results for input(s): PROCALCITON, LATICACIDVEN in the last 168 hours.  Recent Results (from the past 240 hour(s))  Culture, blood (Routine X 2) w Reflex to ID Panel     Status: None (Preliminary result)   Collection Time: 03/29/18  3:36 PM  Result Value Ref Range Status   Specimen Description LEFT ANTECUBITAL  Final   Special Requests   Final    BOTTLES DRAWN AEROBIC AND ANAEROBIC Blood Culture results may not be optimal due to an excessive volume of blood received in culture bottles   Culture   Final    NO  GROWTH 4 DAYS Performed at [facility], [address] [place], [place] [address]    Report Status PENDING  Incomplete  Culture, blood (Routine X 2) w Reflex to ID Panel     Status: None (Preliminary result)   Collection Time: 03/29/18  7:34 PM  Result Value Ref Range Status   Specimen Description RIGHT ANTECUBITAL  Final   Special Requests   Final    BOTTLES DRAWN AEROBIC AND ANAEROBIC Blood Culture adequate volume   Culture   Final    NO GROWTH 4 DAYS Performed at [facility], [address], [place], [place] [address]    Report Status PENDING  Incomplete         Radiology Studies: No results found.      Scheduled Meds: . citalopram  20 mg Oral Daily  . diclofenac sodium  4 g Topical QID  . fluconazole  150 mg Oral Daily  . heparin  5,000 Units Subcutaneous Q8H  . nystatin   Topical TID  . rosuvastatin  20 mg Oral q1800  . sodium chloride flush  3 mL Intravenous Q12H   Continuous Infusions: . sodium chloride 10 mL/hr at 03/29/18 2136  . sodium chloride    . cefTRIAXone (ROCEPHIN)  IV Stopped (04/01/18 2130)     LOS: 2 days    Time spent:    Erick Blinks, MD [facility] Pager [phone]  If 7PM-7AM, please contact night-coverage [email or web address] Password [facility] 04/02/2018, 7:19 PM

## 2018-04-03 ENCOUNTER — Other Ambulatory Visit: Payer: Self-pay

## 2018-04-03 ENCOUNTER — Inpatient Hospital Stay
Admission: RE | Admit: 2018-04-03 | Discharge: 2018-04-17 | Disposition: A | Discharge status: Home / Self Care | Payer: Medicare HMO | Source: Ambulatory Visit | Attending: Internal Medicine | Admitting: Internal Medicine

## 2018-04-03 ENCOUNTER — Encounter: Payer: Self-pay | Admitting: Internal Medicine

## 2018-04-03 ENCOUNTER — Non-Acute Institutional Stay (SKILLED_NURSING_FACILITY): Payer: Medicare HMO | Admitting: Internal Medicine

## 2018-04-03 DIAGNOSIS — D72829 Elevated white blood cell count, unspecified: Secondary | ICD-10-CM | POA: Diagnosis not present

## 2018-04-03 DIAGNOSIS — B372 Candidiasis of skin and nail: Secondary | ICD-10-CM | POA: Diagnosis not present

## 2018-04-03 DIAGNOSIS — J449 Chronic obstructive pulmonary disease, unspecified: Secondary | ICD-10-CM | POA: Diagnosis not present

## 2018-04-03 DIAGNOSIS — E7841 Elevated Lipoprotein(a): Secondary | ICD-10-CM | POA: Diagnosis not present

## 2018-04-03 DIAGNOSIS — Z741 Need for assistance with personal care: Secondary | ICD-10-CM | POA: Diagnosis not present

## 2018-04-03 DIAGNOSIS — F411 Generalized anxiety disorder: Secondary | ICD-10-CM

## 2018-04-03 DIAGNOSIS — D72820 Lymphocytosis (symptomatic): Secondary | ICD-10-CM | POA: Diagnosis not present

## 2018-04-03 DIAGNOSIS — R262 Difficulty in walking, not elsewhere classified: Secondary | ICD-10-CM | POA: Diagnosis not present

## 2018-04-03 DIAGNOSIS — I10 Essential (primary) hypertension: Secondary | ICD-10-CM | POA: Diagnosis not present

## 2018-04-03 DIAGNOSIS — W19XXXS Unspecified fall, sequela: Secondary | ICD-10-CM | POA: Diagnosis not present

## 2018-04-03 DIAGNOSIS — B354 Tinea corporis: Secondary | ICD-10-CM | POA: Diagnosis not present

## 2018-04-03 DIAGNOSIS — L409 Psoriasis, unspecified: Secondary | ICD-10-CM | POA: Diagnosis not present

## 2018-04-03 DIAGNOSIS — E876 Hypokalemia: Secondary | ICD-10-CM | POA: Diagnosis not present

## 2018-04-03 DIAGNOSIS — L0390 Cellulitis, unspecified: Secondary | ICD-10-CM

## 2018-04-03 DIAGNOSIS — R531 Weakness: Secondary | ICD-10-CM | POA: Diagnosis not present

## 2018-04-03 DIAGNOSIS — L03314 Cellulitis of groin: Secondary | ICD-10-CM | POA: Diagnosis not present

## 2018-04-03 DIAGNOSIS — M6281 Muscle weakness (generalized): Secondary | ICD-10-CM | POA: Diagnosis not present

## 2018-04-03 LAB — CULTURE, BLOOD (ROUTINE X 2)
Culture: NO GROWTH
Culture: NO GROWTH
SPECIAL REQUESTS: ADEQUATE

## 2018-04-03 MED ORDER — CEPHALEXIN 500 MG PO CAPS: 500.0000 mg | ORAL_CAPSULE | Freq: Three times a day (TID) | ORAL | 0 refills | Status: AC

## 2018-04-03 MED ORDER — FLUCONAZOLE 150 MG PO TABS: 150.0000 mg | ORAL_TABLET | Freq: Every day | ORAL | 0 refills | Status: AC

## 2018-04-03 MED ORDER — ALPRAZOLAM 0.5 MG PO TABS: 0.5000 mg | ORAL_TABLET | Freq: Three times a day (TID) | ORAL | 0 refills | Status: DC | PRN

## 2018-04-03 MED ORDER — NYSTATIN 100000 UNIT/GM EX POWD: Freq: Three times a day (TID) | CUTANEOUS | 0 refills | Status: AC

## 2018-04-03 MED ORDER — TRAMADOL HCL 50 MG PO TABS: 100.0000 mg | ORAL_TABLET | Freq: Four times a day (QID) | ORAL | 0 refills | Status: DC | PRN

## 2018-04-03 MED ORDER — ALPRAZOLAM 0.5 MG PO TABS: 0.5000 mg | ORAL_TABLET | Freq: Three times a day (TID) | ORAL | 5 refills | Status: DC | PRN

## 2018-04-03 NOTE — Discharge Summary (Signed)
Physician Discharge Summary  KASHINA MECUM ZOX:[id number] DOB: 1946-02-16 DOA: 03/29/2018  PCP: Remus Loffler, PA-C  Admit date: 03/29/2018 Discharge date: 04/03/2018  Disposition: SNF   Discharge Condition: STABLE   CODE STATUS: FULL    Brief Hospitalization Summary: Please see all hospital notes, images, labs for full details of the hospitalization. HPI: Dr. Mariea Clonts:  Denise Underwood  is a 72 y.o. female past medical history relevant for COPD, tobacco abuse, chronic leukocytosis, psoriasis and recurrent candidal intertrigo of the groin/inguinal area and inframammary areas who presents to the ED with generalized weakness and fatigue as well as recurrent falls at home  Patient lives alone, denies head injury, denies syncopal events, no chest pains no palpitations no dizziness she does complain of leg weakness  Additional history obtained from 2 daughters at bedside...Marland KitchenMarland KitchenMarland Kitchen there are concerns about his skin hygiene, Patient admits that she goes at  Times up to  10 days without taking a shower or Bath due to fear of falling  In ED... Patient was found to have transient hypoxia, no wheezing no significant productive cough no fevers patient last smoked apparently couple weeks ago, more than 40 pack years, encouraged to stay abstinent from tobacco  In ED she is found to have leukocytosis but please note that patient has had chronic leukocytosis for many many years----she has had extensive hematology/oncology work-up at [facility] previously and as well as with Dr. Darnelle Catalan at [facility], a baseline white count is usually around 15 K  EDP--EDP obtained CT head, chest x-ray and hip x-rays due to history of recurrent falls, no acute findings on imaging studies.    Brief Narrative:  72 year old female with a history of COPD chronic leukocytosis, psoriasis, is brought to the hospital by family with generalized weakness.  She has had several falls in the past few weeks.  They also noted that she had  significant cellulitis/intertrigo in her skin folds.  She was started on intravenous antibiotics and antifungal agents.  She was noted to be hypokalemic and mildly dehydrated.  Electrolytes are being replaced.  She was admitted for further treatment.   Assessment & Plan:   Principal Problem:   Groin/inguinal and infra-mammary Area Candida intertrigo with superimposed cellulitis Active Problems:   Hypertension   Leukocytosis   Tinea corporis   Candidiasis of skin/Groin/inguinal and infra-mammary Area Candida intertrigo with superimposed cellulitis   Candidal intertrigo/Groin/inguinal and infra-mammary Area Candida intertrigo with superimposed cellulitis   Falls   Hypokalemia   1. Candida intertrigo with superimposed cellulitis.  Treated with oral Diflucan, nystatin powder as well as intravenous ceftriaxone.  Continues to slowly improve.  Discharge on oral cephalexin and complete course of oral fluconazole and topical nystatin powder.   2. Leukocytosis.  Likely elevated due to infection, although she does have a chronic leukocytosis.  Leukocytosis appears to be back to baseline.   3. Hypokalemia/hypomagnesemia.  Replaced.  Discontinued hydrochlorothiazide. 4. Generalized weakness.  Seen by physical therapy recommended skilled nursing facility placement.   5. Right ankle pain.  Patient reports this occurred after a fall.  She has increased pain on range of motion.  Xrays do not show any fracture or dislocation. Likely ankle sprain, treat supportively.  Clinically improving.  PT eval at SNF recommended.   6. COPD.  STABLE.  No shortness of breath or wheezing at this time.  Continue home medications. 7. Hypertension.  Hydrochlorothiazide discontinued due to soft blood pressures.  Continue to follow blood pressure recommended.   8. psoriasis.  No  evidence of flares at this time.   DVT prophylaxis: Heparin Code Status: Full code Family Communication: No family present today Disposition  Plan: SNF placement    Antimicrobials:   Ceftriaxone 11/17>11/22  Fluconazole 11/17  Discharge Diagnoses:  Principal Problem:   Groin/inguinal and infra-mammary Area Candida intertrigo with superimposed cellulitis Active Problems:   Hypertension   Leukocytosis   Tinea corporis   Candidiasis of skin/Groin/inguinal and infra-mammary Area Candida intertrigo with superimposed cellulitis   Candidal intertrigo/Groin/inguinal and infra-mammary Area Candida intertrigo with superimposed cellulitis   Falls   Hypokalemia  Discharge Instructions: Discharge Instructions    Call MD for:  difficulty breathing, headache or visual disturbances   Complete by:  As directed    Call MD for:  extreme fatigue   Complete by:  As directed    Call MD for:  hives   Complete by:  As directed    Call MD for:  persistant dizziness or light-headedness   Complete by:  As directed    Call MD for:  persistant nausea and vomiting   Complete by:  As directed    Call MD for:  redness, tenderness, or signs of infection (pain, swelling, redness, odor or green/yellow discharge around incision site)   Complete by:  As directed    Call MD for:  severe uncontrolled pain   Complete by:  As directed    Increase activity slowly   Complete by:  As directed      Allergies as of 04/03/2018   No Known Allergies     Medication List    STOP taking these medications   econazole nitrate 1 % cream   hydrochlorothiazide 25 MG tablet Commonly known as:  HYDRODIURIL   prednisoLONE 5 MG Tabs tablet     TAKE these medications   ALPRAZolam 0.5 MG tablet Commonly known as:  XANAX Take 1 tablet (0.5 mg total) by mouth 3 (three) times daily as needed for up to 12 days for anxiety. For anxiety. What changed:    when to take this  reasons to take this   cephALEXin 500 MG capsule Commonly known as:  KEFLEX Take 1 capsule (500 mg total) by mouth 3 (three) times daily for 7 days. What changed:  when to take this    citalopram 20 MG tablet Commonly known as:  CELEXA Take 20 mg by mouth daily. For depression What changed:  Another medication with the same name was removed. Continue taking this medication, and follow the directions you see here.   Clobetasol Prop Emollient Base 0.05 % emollient cream Apply 1 application topically 2 (two) times daily. Apply to ECZEMA areas   Diclofenac Sodium 3 % Gel Place 4 g onto the skin 4 (four) times daily as needed.   Fish Oil 1000 MG Caps Take by mouth.   fluconazole 150 MG tablet Commonly known as:  DIFLUCAN Take 1 tablet (150 mg total) by mouth daily for 5 days.   nystatin powder Commonly known as:  MYCOSTATIN/NYSTOP Apply topically 3 (three) times daily for 14 days.   rosuvastatin 20 MG tablet Commonly known as:  CRESTOR Take 1 tablet (20 mg total) by mouth daily.   traMADol 50 MG tablet Commonly known as:  ULTRAM Take 2 tablets (100 mg total) by mouth every 6 (six) hours as needed for moderate pain. For pain What changed:  reasons to take this   Vitamin D3 25 MCG (1000 UT) Caps Take by mouth.      Contact information for  after-discharge care    Destination    [facility] .   Service:  Skilled Nursing Contact information: [address] [place] [address] [phone]             No Known Allergies Allergies as of 04/03/2018   No Known Allergies     Medication List    STOP taking these medications   econazole nitrate 1 % cream   hydrochlorothiazide 25 MG tablet Commonly known as:  HYDRODIURIL   prednisoLONE 5 MG Tabs tablet     TAKE these medications   ALPRAZolam 0.5 MG tablet Commonly known as:  XANAX Take 1 tablet (0.5 mg total) by mouth 3 (three) times daily as needed for up to 12 days for anxiety. For anxiety. What changed:    when to take this  reasons to take this   cephALEXin 500 MG capsule Commonly known as:  KEFLEX Take 1 capsule (500 mg total) by mouth 3  (three) times daily for 7 days. What changed:  when to take this   citalopram 20 MG tablet Commonly known as:  CELEXA Take 20 mg by mouth daily. For depression What changed:  Another medication with the same name was removed. Continue taking this medication, and follow the directions you see here.   Clobetasol Prop Emollient Base 0.05 % emollient cream Apply 1 application topically 2 (two) times daily. Apply to ECZEMA areas   Diclofenac Sodium 3 % Gel Place 4 g onto the skin 4 (four) times daily as needed.   Fish Oil 1000 MG Caps Take by mouth.   fluconazole 150 MG tablet Commonly known as:  DIFLUCAN Take 1 tablet (150 mg total) by mouth daily for 5 days.   nystatin powder Commonly known as:  MYCOSTATIN/NYSTOP Apply topically 3 (three) times daily for 14 days.   rosuvastatin 20 MG tablet Commonly known as:  CRESTOR Take 1 tablet (20 mg total) by mouth daily.   traMADol 50 MG tablet Commonly known as:  ULTRAM Take 2 tablets (100 mg total) by mouth every 6 (six) hours as needed for moderate pain. For pain What changed:  reasons to take this   Vitamin D3 25 MCG (1000 UT) Caps Take by mouth.       Procedures/Studies: Dg Chest 2 View  Result Date: 03/29/2018 CLINICAL DATA:  Hypoxia. EXAM: CHEST - 2 VIEW COMPARISON:  None. FINDINGS: The heart size and mediastinal contours are within normal limits. Both lungs are clear. The visualized skeletal structures are unremarkable. IMPRESSION: No active cardiopulmonary disease. Electronically Signed   By: Obie Dredge M.D.   On: 03/29/2018 16:26   Dg Pelvis 1-2 Views  Result Date: 03/29/2018 CLINICAL DATA:  Bilateral hip pain. EXAM: PELVIS - 1-2 VIEW COMPARISON:  None. FINDINGS: Bones are diffusely demineralized. SI joints and symphysis pubis unremarkable. No evidence for an acute fracture. IMPRESSION: Negative. Electronically Signed   By: Kennith Center M.D.   On: 03/29/2018 16:29   Ct Head Wo Contrast  Result Date:  03/29/2018 CLINICAL DATA:  Weakness for the past 3 days. EXAM: CT HEAD WITHOUT CONTRAST TECHNIQUE: Contiguous axial images were obtained from the base of the skull through the vertex without intravenous contrast. COMPARISON:  None. FINDINGS: Brain: Mild-to-moderate enlargement of the ventricles and subarachnoid spaces. Moderate patchy white matter low density in both cerebral hemispheres. No intracranial hemorrhage, mass lesion or CT evidence of acute infarction. Vascular: No hyperdense vessel or unexpected calcification. Skull: Normal. Negative for fracture or focal  lesion. Sinuses/Orbits: Unremarkable. Other: None. IMPRESSION: 1. No acute abnormality. 2. Mild to moderate diffuse cerebral and cerebellar atrophy. 3. Moderate chronic small vessel white matter ischemic changes in both cerebral hemispheres. Electronically Signed   By: Beckie SaltsSteven  Reid M.D.   On: 03/29/2018 16:40   Dg Foot Complete Right  Result Date: 03/30/2018 CLINICAL DATA:  Foot pain. EXAM: RIGHT FOOT COMPLETE - 3+ VIEW COMPARISON:  None. FINDINGS: The bones appear osteopenic. There are no acute fractures or dislocations identified. Plantar heel spur noted. Mild diffuse soft tissue edema. IMPRESSION: 1. No acute bone abnormality. 2. Mild soft tissue edema. 3. Plantar heel spur Electronically Signed   By: Signa Kellaylor  Stroud M.D.   On: 03/30/2018 16:20      Subjective: Pt awake, alert, cooperative, No distress, no complaints.  Itching is much improved.   Discharge Exam: Vitals:   04/02/18 2130 04/03/18 0633  BP: (!) 96/51 (!) 118/56  Pulse: 79 85  Resp: 20 20  Temp: 98.6 F (37 C) 98.6 F (37 C)  SpO2: 95% 96%   Vitals:   04/02/18 2013 04/02/18 2130 04/03/18 0633 04/03/18 0737  BP:  (!) 96/51 (!) 118/56   Pulse:  79 85   Resp:  20 20   Temp:  98.6 F (37 C) 98.6 F (37 C)   TempSrc:  Oral Oral   SpO2: (!) 86% 95% 96%   Weight:    86.8 kg  Height:    5\' 4"  (1.626 m)    General: Pt is alert, awake, not in acute  distress Cardiovascular: normal S1/S2 +, no rubs, no gallops Respiratory: CTA bilaterally, no wheezing, no rhonchi Abdominal: Soft, NT, ND, bowel sounds + Skin: area of yeast infection and cellulitis is improving.  Extremities: no edema, no cyanosis   The results of significant diagnostics from this hospitalization (including imaging, microbiology, ancillary and laboratory) are listed below for reference.    Microbiology: Recent Results (from the past 240 hour(s))  Culture, blood (Routine X 2) w Reflex to ID Panel     Status: None   Collection Time: 03/29/18  3:36 PM  Result Value Ref Range Status   Specimen Description LEFT ANTECUBITAL  Final   Special Requests   Final    BOTTLES DRAWN AEROBIC AND ANAEROBIC Blood Culture results may not be optimal due to an excessive volume of blood received in culture bottles   Culture   Final    NO GROWTH 5 DAYS Performed at [facility], [address] [place], [place] [address]    Report Status 04/03/2018 FINAL  Final  Culture, blood (Routine X 2) w Reflex to ID Panel     Status: None   Collection Time: 03/29/18  7:34 PM  Result Value Ref Range Status   Specimen Description RIGHT ANTECUBITAL  Final   Special Requests   Final    BOTTLES DRAWN AEROBIC AND ANAEROBIC Blood Culture adequate volume   Culture   Final    NO GROWTH 5 DAYS Performed at [facility], [address] [place], [place] [address]    Report Status 04/03/2018 FINAL  Final    Labs: BNP (last 3 results) No results for input(s): BNP in the last 8760 hours. Basic Metabolic Panel: Recent Labs  Lab 03/29/18 1540 03/29/18 1934 03/30/18 0504 03/31/18 0538 04/02/18 0429  NA 136  --  139 138 140  K 2.9*  --  3.6 3.8 3.2*  CL 91*  --  96* 101 106  CO2 35*  --  35* 28 28  GLUCOSE 115*  --  93 116* 85  BUN 16  --  17 14 10   CREATININE 1.23*  --  1.18* 1.20* 0.90  CALCIUM 9.1  --  8.5* 8.2* 8.3*  MG  --  1.2* 1.7  --   --    Liver Function Tests: Recent Labs  Lab  03/29/18 1540  AST 19  ALT 10  ALKPHOS 48  BILITOT 1.1  PROT 7.1  ALBUMIN 3.0*   No results for input(s): LIPASE, AMYLASE in the last 168 hours. No results for input(s): AMMONIA in the last 168 hours. CBC: Recent Labs  Lab 03/29/18 1540 03/30/18 0504 03/31/18 0538 04/02/18 0429  WBC 20.5* 16.6* 15.3* 15.2*  NEUTROABS 15.4*  --   --   --   HGB 12.7 10.7* 10.8* 10.3*  HCT 40.0 35.1* 36.2 33.4*  MCV 102.0* 102.9* 104.0* 101.8*  PLT 340 298 300 302   Cardiac Enzymes: No results for input(s): CKTOTAL, CKMB, CKMBINDEX, TROPONINI in the last 168 hours. BNP: Invalid input(s): POCBNP CBG: No results for input(s): GLUCAP in the last 168 hours. D-Dimer No results for input(s): DDIMER in the last 72 hours. Hgb A1c No results for input(s): HGBA1C in the last 72 hours. Lipid Profile No results for input(s): CHOL, HDL, LDLCALC, TRIG, CHOLHDL, LDLDIRECT in the last 72 hours. Thyroid function studies No results for input(s): TSH, T4TOTAL, T3FREE, THYROIDAB in the last 72 hours.  Invalid input(s): FREET3 Anemia work up No results for input(s): VITAMINB12, FOLATE, FERRITIN, TIBC, IRON, RETICCTPCT in the last 72 hours. Urinalysis    Component Value Date/Time   COLORURINE YELLOW 03/29/2018 1508   APPEARANCEUR HAZY (A) 03/29/2018 1508   APPEARANCEUR Clear 05/30/2017 1019   LABSPEC 1.018 03/29/2018 1508   PHURINE 6.0 03/29/2018 1508   GLUCOSEU NEGATIVE 03/29/2018 1508   HGBUR MODERATE (A) 03/29/2018 1508   BILIRUBINUR NEGATIVE 03/29/2018 1508   BILIRUBINUR Negative 05/30/2017 1019   KETONESUR NEGATIVE 03/29/2018 1508   PROTEINUR 100 (A) 03/29/2018 1508   NITRITE NEGATIVE 03/29/2018 1508   LEUKOCYTESUR NEGATIVE 03/29/2018 1508   LEUKOCYTESUR 2+ (A) 05/30/2017 1019   Sepsis Labs Invalid input(s): PROCALCITONIN,  WBC,  LACTICIDVEN Microbiology Recent Results (from the past 240 hour(s))  Culture, blood (Routine X 2) w Reflex to ID Panel     Status: None   Collection Time:  03/29/18  3:36 PM  Result Value Ref Range Status   Specimen Description LEFT ANTECUBITAL  Final   Special Requests   Final    BOTTLES DRAWN AEROBIC AND ANAEROBIC Blood Culture results may not be optimal due to an excessive volume of blood received in culture bottles   Culture   Final    NO GROWTH 5 DAYS Performed at [facility], [address], [place], [place] [address]    Report Status 04/03/2018 FINAL  Final  Culture, blood (Routine X 2) w Reflex to ID Panel     Status: None   Collection Time: 03/29/18  7:34 PM  Result Value Ref Range Status   Specimen Description RIGHT ANTECUBITAL  Final   Special Requests   Final    BOTTLES DRAWN AEROBIC AND ANAEROBIC Blood Culture adequate volume   Culture   Final    NO GROWTH 5 DAYS Performed at [facility], [address], [place], [place] [address]    Report Status 04/03/2018 FINAL  Final   Time coordinating discharge: 32 minutes   SIGNED:  Standley Dakins, MD  [facility] 04/03/2018, 10:14  AM Pager [phone]  If 7PM-7AM, please contact night-coverage [email or web address] Password [email or web address]

## 2018-04-03 NOTE — Progress Notes (Signed)
[facility]. PIV x 1 removed.

## 2018-04-03 NOTE — Progress Notes (Signed)
Location:    [facility] Room Number: 116/D Place of Service:  SNF (31) Provider: Edmon Crape PA-C  Remus Loffler, PA-C  Patient Care Team: Caryl Never as PCP - General (Physician Assistant) Magrinat, Valentino Hue, MD as Consulting Physician (Oncology) Toma Deiters, MD (Internal Medicine) Michae Kava, MD as Consulting Physician (Internal Medicine)  Extended Emergency Contact Information Primary Emergency Contact: Hill,DENISE Address: [address]          [place],  [address] Home Phone: [phone] Relation: None  Code Status:  Full Code Goals of care: Advanced Directive information Advanced Directives 04/03/2018  Does Patient Have a Medical Advance Directive? Yes  Type of Advance Directive (No Data)  Does patient want to make changes to medical advance directive? No - Patient declined  Would patient like information on creating a medical advance directive? No - Patient declined     Chief Complaint  Patient presents with  . Hospitalization Follow-up    Hospitalization F/U Visit   Status post follow-up for candidiasis with superimposed cellulitis and generalized weakness.   HPI:  Pt is a 72 y.o. female seen today for follow-up of hospitalization for candidiasis with coexistent cellulitis.  Patient has a history of COPD as well as tobacco abuse chronic leukocytosis in addition to psoriasis and recurrent Candida  intertrigo rashes of the groin inguinal area and inflammatory areas   She came to the ER with generalized weakness and fatigue and recurrent falls.  There were concerns by the family of skin hygiene while she was at home.  In the ED she was found to have transient hypoxia with no significant cough or wheezing and no fevers.  She does have a history of tobacco use.  She also had leukocytosis this has been worked up by hematology oncology at [facility] as well as at [facility]- her baseline count is around 15,000.  In the ED her  CT of the head and x-rays of the hip were negative. She was found to have significant cellulitis in her skin folds and was started on IV antibiotics and antifungal agents.  She was also hypokalemic and mildly dehydrated electrolytes were replaced. In regards to cellulitis with candidiasis coexistent-she was treated with oral Diflucan as well as IV Rocephin.  She has been discharged on oral cephalexin and completing a course of oral fluconazole and topical nystatin powder.  In regards to hypokalemia with low magnesium this was replaced her hydrochlorothiazide was discontinued it was discontinued secondary to soft blood pressures.  She also reported right ankle pain after the fall x-ray did not show any acute situation thought to be sprain with recommendation for supportive treatment and physical therapy.  At this point she is resting in bed comfortably she does have significant rashes erythematous changes most prominently of her abdominal and inguinal folds.  She is afebrile and does not really have any acute complaints at this time    Past Medical History:  Diagnosis Date  . Anxiety   . Arthritis   . Hyperlipidemia   . Hypertension   . Psoriasis    Past Surgical History:  Procedure Laterality Date  . ABDOMINAL HYSTERECTOMY    . CHOLECYSTECTOMY    . HERNIA REPAIR    . KNEE SURGERY    . TUBAL LIGATION      No Known Allergies  Outpatient Encounter Medications as of 04/03/2018  Medication Sig  . ALPRAZolam (XANAX) 0.5 MG tablet Take 1 tablet (0.5 mg total) by  mouth 3 (three) times daily as needed for up to 12 days for anxiety. For anxiety.  . cephALEXin (KEFLEX) 500 MG capsule Take 1 capsule (500 mg total) by mouth 3 (three) times daily for 7 days.  . Cholecalciferol (VITAMIN D3) 1000 units CAPS Take by mouth.  . citalopram (CELEXA) 20 MG tablet Take 20 mg by mouth daily. For depression  . Clobetasol Prop Emollient Base (CLOBETASOL PROPIONATE E) 0.05 % emollient cream Apply 1  application topically 2 (two) times daily. Apply to ECZEMA areas  . Diclofenac Sodium 3 % GEL Place 4 g onto the skin 4 (four) times daily as needed.  . fluconazole (DIFLUCAN) 150 MG tablet Take 1 tablet (150 mg total) by mouth daily for 5 days.  Marland Kitchen nystatin (MYCOSTATIN/NYSTOP) powder Apply topically 3 (three) times daily for 14 days.  . Omega-3 Fatty Acids (FISH OIL) 1000 MG CAPS Take by mouth.  . rosuvastatin (CRESTOR) 20 MG tablet Take 1 tablet (20 mg total) by mouth daily.  . traMADol (ULTRAM) 50 MG tablet Take 2 tablets (100 mg total) by mouth every 6 (six) hours as needed for moderate pain. For pain  . [DISCONTINUED] ALPRAZolam (XANAX) 0.5 MG tablet Take 1 tablet (0.5 mg total) by mouth 3 (three) times daily as needed for up to 12 days for anxiety. For anxiety.  . [DISCONTINUED] 0.9 %  sodium chloride infusion   . [DISCONTINUED] 0.9 %  sodium chloride infusion   . [DISCONTINUED] acetaminophen (TYLENOL) suppository 650 mg   . [DISCONTINUED] acetaminophen (TYLENOL) tablet 650 mg   . [DISCONTINUED] albuterol (PROVENTIL) (2.5 MG/3ML) 0.083% nebulizer solution 2.5 mg   . [DISCONTINUED] ALPRAZolam (XANAX) tablet 0.5 mg   . [DISCONTINUED] cefTRIAXone (ROCEPHIN) 1 g in sodium chloride 0.9 % 100 mL IVPB   . [DISCONTINUED] citalopram (CELEXA) tablet 20 mg   . [DISCONTINUED] diclofenac sodium (VOLTAREN) 1 % transdermal gel 4 g   . [DISCONTINUED] fluconazole (DIFLUCAN) tablet 150 mg   . [DISCONTINUED] heparin injection 5,000 Units   . [DISCONTINUED] hydrALAZINE (APRESOLINE) injection 10 mg   . [DISCONTINUED] nystatin (MYCOSTATIN/NYSTOP) topical powder   . [DISCONTINUED] ondansetron (ZOFRAN) injection 4 mg   . [DISCONTINUED] ondansetron (ZOFRAN) tablet 4 mg   . [DISCONTINUED] oxyCODONE (Oxy IR/ROXICODONE) immediate release tablet 5 mg   . [DISCONTINUED] polyethylene glycol (MIRALAX / GLYCOLAX) packet 17 g   . [DISCONTINUED] rosuvastatin (CRESTOR) tablet 20 mg   . [DISCONTINUED] sodium chloride  flush (NS) 0.9 % injection 3 mL   . [DISCONTINUED] sodium chloride flush (NS) 0.9 % injection 3 mL   . [DISCONTINUED] traMADol (ULTRAM) tablet 50 mg   . [DISCONTINUED] traZODone (DESYREL) tablet 50 mg    No facility-administered encounter medications on file as of 04/03/2018.      Review of Systems   In general she is not complaining of any fever or chills.  Skin does have extensive issues as noted above in her inguinal and abdominal fold areas most notably does have a history of psoriasis.  Head ears eyes nose mouth and throat is not complaining of any sore throat or visual changes.  Respiratory denies shortness of breath or cough.  Cardiac is not complaining of chest pain she has some mild lower extremity edema.  GI is not complaining of abdominal pain nausea vomiting diarrhea constipation.  GU does not complain of dysuria.  Musculoskeletal has weakness especially lower extremities says at times her right ankle area feels sore as noted above this was x-rayed and thought to be most likely  a sprain  Neurologic is not complaint dizziness headache or numbness.  And psych appears to be in good spirits does not complain of being overtly depressed or anxious  Immunization History  Administered Date(s) Administered  . Influenza Inj Mdck Quad Pf 02/26/2017  . Influenza, High Dose Seasonal PF 02/17/2018   Pertinent  Health Maintenance Due  Topic Date Due  . MAMMOGRAM  05/03/2018 (Originally 02/16/1995)  . DEXA SCAN  05/03/2018 (Originally 02/15/2010)  . COLONOSCOPY  05/03/2018 (Originally 02/16/1995)  . PNA vac Low Risk Adult (1 of 2 - PCV13) 05/03/2018 (Originally 02/15/2010)  . INFLUENZA VACCINE  Completed   Fall Risk  02/17/2018 11/26/2017 11/19/2017 10/17/2017 10/09/2017  Falls in the past year? No Yes Yes Yes Yes  Number falls in past yr: - 2 or more 2 or more 2 or more 2 or more  Injury with Fall? - Yes Yes Yes Yes  Comment - - - - Fell, injured knee.  Risk Factor Category  -  High Fall Risk High Fall Risk - -   Functional Status Survey:    Vitals:   04/03/18 1553  BP: 120/65  Pulse: 82  Resp: 18  Temp: 97.8 F (36.6 C)  TempSrc: Oral    Physical Exam   In general this is a pleasant obese elderly female in no distress resting comfortably in bed.  Her skin is warm and dry she does have psoriatic lesions diffuse on her extremities-she continues to have significant rash erythema of her abdominal folds inguinal areas-there is some scaling-.  Eyes visual acuity appears to be intact sclera and conjunctive are clear.  Oropharynx is clear mucous membranes moist.  Chest is clear to auscultation with somewhat shallow air entry there is no labored breathing.  Heart is regular rate and rhythm no gallop or rub she has mild lower extremity edema.  Abdomen is obese soft nontender with positive bowel sounds.  Musculoskeletal Limited exam since she is in bed but upper extremity strength appears preserved she does have weakness of her lower extremities bilaterally. Could not appreciate any deformity of the right ankle there is some mild tenderness to palpation  Neurologic is grossly intact her speech is clear could not really appreciate lateralizing findings.  Psych she is alert and oriented pleasant and appropriate.    Labs reviewed: Recent Labs    03/29/18 1934 03/30/18 0504 03/31/18 0538 04/02/18 0429  NA  --  139 138 140  K  --  3.6 3.8 3.2*  CL  --  96* 101 106  CO2  --  35* 28 28  GLUCOSE  --  93 116* 85  BUN  --  17 14 10   CREATININE  --  1.18* 1.20* 0.90  CALCIUM  --  8.5* 8.2* 8.3*  MG 1.2* 1.7  --   --    Recent Labs    04/28/17 1111 03/29/18 1540  AST 16 19  ALT 12 10  ALKPHOS 64 48  BILITOT 0.3 1.1  PROT 6.8 7.1  ALBUMIN 4.1 3.0*   Recent Labs    05/30/17 1050  07/02/17 0750 03/29/18 1540 03/30/18 0504 03/31/18 0538 04/02/18 0429  WBC 13.9*  14.0*   < > 17.4* 20.5* 16.6* 15.3* 15.2*  NEUTROABS 8.8*  8.9*  --  12.8*  15.4*  --   --   --   HGB 12.8  12.7   < > 12.4 12.7 10.7* 10.8* 10.3*  HCT 39.2  39.3   < > 38.4 40.0 35.1* 36.2  33.4*  MCV 95  95   < > 96.7 102.0* 102.9* 104.0* 101.8*  PLT 231  223   < > 226 340 298 300 302   < > = values in this interval not displayed.   Lab Results  Component Value Date   TSH 1.850 04/28/2017   No results found for: HGBA1C Lab Results  Component Value Date   CHOL 144 04/28/2017   HDL 47 04/28/2017   LDLCALC 58 04/28/2017   TRIG 195 (H) 04/28/2017   CHOLHDL 3.1 04/28/2017    Significant Diagnostic Results in last 30 days:  No results found.  Assessment/Plan  #1 history of cellulitis- in the [place] changes- she is completing a course of cephalexin she will be on this through November 28.  She also is on Diflucan through November 27.  She also has an order for nystatin powder 3 times a day for 2 weeks.  Also Clobetasol cream twice a day  For any significant pain she does have tramadol every 6 hours as needed.  2.  History of psoriasis she continuesdiclofenac gel 4 times a day as needed.  3.-  History hypertension-she is no longer on hydrochlorothiazide secondary to soft blood pressures at this point will monitor blood pressures  Blood pressure appears to be stable today.  4.-  History of depression this appears well controlled at this point on Celexa.  5.  History of anxiety does have an order for Ativan as needed 3 times a day this appears stable currently.  6.  History of hyperlipidemia continues on a statin as well as fish oil--as I expect her stay here to be fairly short will not be aggressive panel and will defer to primary care provider.  7 leukocytosis as noted above this is chronic and appears to be relatively baseline around 15,000 will update this.  8.  History of hypokalemia and low magnesium magnesium was supplemented it did rise up to 1.7 was as low as 1.2.  Potassium on most recent lab was 3.2 this apparently has been supplemented  in the hospital and will update metabolic panel tomorrow  9.  COPD at this point will continue to monitor she does have reduced breath sounds but is not really complaining of shortness of breath cannot really appreciate any significant congestion.  10 right ankle discomfort- again she would I suspect benefit from therapy there is an order for tramadol for pain at this point monitor   [id number] of note greater than 40 minutes spent assessing patient-reviewing her chart and labs- and coordinating and formulating a plan of care for numerous diagnoses- of note greater than 50% of time spent coordinating a plan of care with input as noted above  .

## 2018-04-03 NOTE — Progress Notes (Signed)
Patient transferred [facility] Room 116 with RN and NT via wheelchair. All belongings sent with patient. Patient family updated by her. Receiving LPN at bedside.

## 2018-04-03 NOTE — Clinical Social Work Placement (Signed)
   CLINICAL SOCIAL WORK PLACEMENT  NOTE  Date:  04/03/2018  Patient Details  Name: Denise Underwood MRN: [id number] Date of Birth: 12/25/1945  Clinical Social Work is seeking post-discharge placement for this patient at the Skilled  Nursing Facility level of care (*CSW will initial, date and re-position this form in  chart as items are completed):  Yes   Patient/family provided with [facility] Clinical Social Work Department's list of facilities offering this level of care within the geographic area requested by the patient (or if unable, by the patient's family).  Yes   Patient/family informed of their freedom to choose among providers that offer the needed level of care, that participate in Medicare, Medicaid or managed care program needed by the patient, have an available bed and are willing to accept the patient.  Yes   Patient/family informed of [facility]'s ownership interest in [facility] and [facility], as well as of the fact that they are under no obligation to receive care at these facilities.  PASRR submitted to EDS on 03/31/18     PASRR number received on 03/31/18     Existing PASRR number confirmed on       FL2 transmitted to all facilities in geographic area requested by pt/family on 03/31/18     FL2 transmitted to all facilities within larger geographic area on       Patient informed that his/her managed care company has contracts with or will negotiate with certain facilities, including the following:        Yes   Patient/family informed of bed offers received.  [facility]     Physician recommends and patient chooses bed at      [facility].  [facility]     Patient family notified on 04/03/18 of transfer.  Name of family member notified:  Denise Underwood, daughter     PHYSICIAN       Additional Comment:  Discharge clinicals  sent. LCSW signing off.   _______________________________________________ Annice NeedySettle, Aldine Chakraborty D, LCSW 04/03/2018, 10:48 AM

## 2018-04-03 NOTE — Telephone Encounter (Signed)
RX Fax for [facility]@ [phone]  

## 2018-04-03 NOTE — Progress Notes (Signed)
Physical Therapy Treatment Patient Details Name: Denise Underwood MRN: [id number] DOB: 12/27/1945 Today's Date: 04/03/2018    History of Present Illness Denise Underwood  is a 72 y.o. female past medical history relevant for COPD, tobacco abuse, chronic leukocytosis, psoriasis and recurrent candidal intertrigo of the groin/inguinal area and inframammary areas who presents to the ED with generalized weakness and fatigue as well as recurrent falls at home    PT Comments    Patient presents seated in chair and agreeable for therapy.  Patient demonstrates increased endurance/distance for gait training without loss of balance, improvement for sit to stands and transfers requiring less assistance and good tolerance for completing BLE exercises while seated in chair.  Patient will benefit from continued physical therapy in hospital and recommended venue below to increase strength, balance, endurance for safe ADLs and gait.   Follow Up Recommendations  SNF;Supervision/Assistance - 24 hour;Supervision for mobility/OOB     Equipment Recommendations  None recommended by PT    Recommendations for Other Services       Precautions / Restrictions Precautions Precautions: Fall Restrictions Weight Bearing Restrictions: No    Mobility  Bed Mobility               General bed mobility comments: presents seated in chair (assisted by nursing staff)  Transfers Overall transfer level: Needs assistance Equipment used: Rolling walker (2 wheeled) Transfers: Sit to/from [employer] CorporationStand;Stand Pivot Transfers Sit to Stand: Min assist Stand pivot transfers: Min assist       General transfer comment: requires less assistance for sit to stands  Ambulation/Gait Ambulation/Gait assistance: Min assist Gait Distance (Feet): 50 Feet Assistive device: Rolling walker (2 wheeled) Gait Pattern/deviations: Decreased step length - right;Decreased step length - left;Decreased stride length Gait velocity: decreased    General Gait Details: demonstrates increased endurance/distance for ambulation with slow labored cadence without loss of balance, limited secondary to fatigue   Stairs             Wheelchair Mobility    Modified Rankin (Stroke Patients Only)       Balance Overall balance assessment: Needs assistance Sitting-balance support: Feet supported;No upper extremity supported Sitting balance-Leahy Scale: Good     Standing balance support: Bilateral upper extremity supported;During functional activity Standing balance-Leahy Scale: Fair Standing balance comment: using RW                            Cognition Arousal/Alertness: Awake/alert Behavior During Therapy: WFL for tasks assessed/performed Overall Cognitive Status: Within Functional Limits for tasks assessed                                        Exercises General Exercises - Lower Extremity Long Arc Quad: Seated;AROM;Strengthening;Both;10 reps Hip Flexion/Marching: Seated;AROM;Strengthening;Both;10 reps Toe Raises: Seated;AROM;Strengthening;10 reps;Both Heel Raises: Seated;AROM;Strengthening;Both;10 reps    General Comments        Pertinent Vitals/Pain Pain Assessment: Faces Faces Pain Scale: Hurts a little bit Pain Location: bilateral ankles and lower legs, right worse than left Pain Descriptors / Indicators: Sore;Discomfort Pain Intervention(s): Limited activity within patient's tolerance;Monitored during session    Home Living                      Prior Function            PT Goals (current goals can now be found in  the care plan section) Acute Rehab PT Goals Patient Stated Goal: return home after rehab PT Goal Formulation: With patient/family Time For Goal Achievement: 04/13/18 Potential to Achieve Goals: Good Progress towards PT goals: Progressing toward goals    Frequency    Min 3X/week      PT Plan Current plan remains appropriate    Co-evaluation               AM-PAC PT "6 Clicks" Daily Activity  Outcome Measure  Difficulty turning over in bed (including adjusting bedclothes, sheets and blankets)?: A Little Difficulty moving from lying on back to sitting on the side of the bed? : A Little Difficulty sitting down on and standing up from a chair with arms (e.g., wheelchair, bedside commode, etc,.)?: Unable Help needed moving to and from a bed to chair (including a wheelchair)?: A Little Help needed walking in hospital room?: A Little Help needed climbing 3-5 steps with a railing? : A Lot 6 Click Score: 15    End of Session Equipment Utilized During Treatment: Gait belt Activity Tolerance: Patient tolerated treatment well;Patient limited by fatigue Patient left: in chair;with call bell/phone within reach;with chair alarm set;with family/visitor present Nurse Communication: Mobility status PT Visit Diagnosis: Unsteadiness on feet (R26.81);Other abnormalities of gait and mobility (R26.89);Muscle weakness (generalized) (M62.81)     Time: 1610-9604 PT Time Calculation (min) (ACUTE ONLY): 28 min  Charges:  $Gait Training: 8-22 mins $Therapeutic Exercise: 8-22 mins                     12:28 PM, 04/03/18 Ocie Bob, MPT Physical Therapist with [facility] [phone] office [phone] mobile phone

## 2018-04-03 NOTE — Care Management Important Message (Signed)
Important Message  Patient Details  Name: Valetta FullerFrances S Blomberg MRN: [id number] Date of Birth: 08/20/1945   Medicare Important Message Given:  Yes    Renie OraHawkins, Sally-Ann Cutbirth Smith 04/03/2018, 10:53 AM

## 2018-04-04 ENCOUNTER — Encounter (HOSPITAL_COMMUNITY)
Admission: RE | Admit: 2018-04-04 | Discharge: 2018-04-04 | Disposition: A | Discharge status: Home / Self Care | Payer: Medicare HMO | Source: Skilled Nursing Facility | Attending: Internal Medicine | Admitting: Internal Medicine

## 2018-04-04 DIAGNOSIS — I10 Essential (primary) hypertension: Secondary | ICD-10-CM | POA: Insufficient documentation

## 2018-04-04 LAB — CBC WITH DIFFERENTIAL/PLATELET
Abs Immature Granulocytes: 0.13 10*3/uL — ABNORMAL HIGH (ref 0.00–0.07)
Basophils Absolute: 0.1 10*3/uL (ref 0.0–0.1)
Basophils Relative: 0 %
EOS ABS: 1.1 10*3/uL — AB (ref 0.0–0.5)
Eosinophils Relative: 8 %
HEMATOCRIT: 36.3 % (ref 36.0–46.0)
Hemoglobin: 11.2 g/dL — ABNORMAL LOW (ref 12.0–15.0)
Immature Granulocytes: 1 %
LYMPHS ABS: 2.5 10*3/uL (ref 0.7–4.0)
LYMPHS PCT: 18 %
MCH: 31.4 pg (ref 26.0–34.0)
MCHC: 30.9 g/dL (ref 30.0–36.0)
MCV: 101.7 fL — ABNORMAL HIGH (ref 80.0–100.0)
Monocytes Absolute: 0.8 10*3/uL (ref 0.1–1.0)
Monocytes Relative: 6 %
NEUTROS PCT: 67 %
NRBC: 0 % (ref 0.0–0.2)
Neutro Abs: 9.3 10*3/uL — ABNORMAL HIGH (ref 1.7–7.7)
PLATELETS: 324 10*3/uL (ref 150–400)
RBC: 3.57 MIL/uL — ABNORMAL LOW (ref 3.87–5.11)
RDW: 12.8 % (ref 11.5–15.5)
WBC: 13.9 10*3/uL — AB (ref 4.0–10.5)

## 2018-04-04 LAB — BASIC METABOLIC PANEL
Anion gap: 7 (ref 5–15)
BUN: 8 mg/dL (ref 8–23)
CHLORIDE: 103 mmol/L (ref 98–111)
CO2: 30 mmol/L (ref 22–32)
Calcium: 9 mg/dL (ref 8.9–10.3)
Creatinine, Ser: 0.85 mg/dL (ref 0.44–1.00)
GFR calc non Af Amer: >60 mL/min (ref >60)
Glucose, Bld: 95 mg/dL (ref 70–99)
Potassium: 4 mmol/L (ref 3.5–5.1)
SODIUM: 140 mmol/L (ref 135–145)

## 2018-04-05 ENCOUNTER — Encounter: Payer: Self-pay | Admitting: Internal Medicine

## 2018-04-06 ENCOUNTER — Encounter (HOSPITAL_COMMUNITY)
Admission: RE | Admit: 2018-04-06 | Discharge: 2018-04-06 | Disposition: A | Discharge status: Home / Self Care | Payer: Medicare HMO | Source: Skilled Nursing Facility

## 2018-04-06 ENCOUNTER — Encounter: Payer: Self-pay | Admitting: Internal Medicine

## 2018-04-06 ENCOUNTER — Non-Acute Institutional Stay (SKILLED_NURSING_FACILITY): Payer: Medicare HMO | Admitting: Internal Medicine

## 2018-04-06 DIAGNOSIS — F411 Generalized anxiety disorder: Secondary | ICD-10-CM | POA: Diagnosis not present

## 2018-04-06 DIAGNOSIS — D72820 Lymphocytosis (symptomatic): Secondary | ICD-10-CM

## 2018-04-06 DIAGNOSIS — B372 Candidiasis of skin and nail: Secondary | ICD-10-CM

## 2018-04-06 DIAGNOSIS — E7841 Elevated Lipoprotein(a): Secondary | ICD-10-CM | POA: Diagnosis not present

## 2018-04-06 DIAGNOSIS — I10 Essential (primary) hypertension: Secondary | ICD-10-CM | POA: Diagnosis not present

## 2018-04-06 DIAGNOSIS — W19XXXS Unspecified fall, sequela: Secondary | ICD-10-CM | POA: Diagnosis not present

## 2018-04-06 LAB — BASIC METABOLIC PANEL
ANION GAP: 9 (ref 5–15)
BUN: 17 mg/dL (ref 8–23)
CO2: 32 mmol/L (ref 22–32)
CREATININE: 1.03 mg/dL — AB (ref 0.44–1.00)
Calcium: 9 mg/dL (ref 8.9–10.3)
Chloride: 98 mmol/L (ref 98–111)
GFR calc Af Amer: >60 mL/min (ref >60)
GFR calc non Af Amer: 53 mL/min — ABNORMAL LOW (ref >60)
Glucose, Bld: 81 mg/dL (ref 70–99)
Potassium: 3.8 mmol/L (ref 3.5–5.1)
SODIUM: 139 mmol/L (ref 135–145)

## 2018-04-06 LAB — BRAIN NATRIURETIC PEPTIDE: B NATRIURETIC PEPTIDE 5: 48 pg/mL (ref 0.0–100.0)

## 2018-04-06 NOTE — Progress Notes (Signed)
Provider:  Einar CrowAnjali Lavonne Cass MD Location:    [facility] Room Number: 116/D Place of Service:  SNF (31)  PCP: Remus LofflerJones, Angel S, PA-C Patient Care Team: Caryl NeverJones, Angel S, PA-C as PCP - General (Physician Assistant) Magrinat, Valentino HueGustav C, MD as Consulting Physician (Oncology) Toma DeitersHasanaj, Xaje A, MD (Internal Medicine) Michae Kavaarovsky, Boris M, MD as Consulting Physician (Internal Medicine)  Extended Emergency Contact Information Primary Emergency Contact: Hill,DENISE Address: [address]          [place],  [id number] Home Phone: [phone] Relation: None  Code Status: Full Code Goals of Care: Advanced Directive information Advanced Directives 04/06/2018  Does Patient Have a Medical Advance Directive? Yes  Type of Advance Directive (No Data)  Does patient want to make changes to medical advance directive? No - Patient declined  Would patient like information on creating a medical advance directive? No - Patient declined      Chief Complaint  Patient presents with  . New Admit To SNF    New Admission Visit    HPI: Patient is a 72 y.o. female seen today for admission to SNF for therapy.   She stayed in the hospital from 11 17-11 22 for cellulitis and weakness  Patient has h/o Hypertension,COPD, hyperlipidemia and Chronic leukocytosis. Patient was brought to the hospital for generalized weakness and fatigue as well as recurrent falls at home.  Patient says that she was weak because her potassium was low.  In the hospital patient was found to have Inframammary and groin rash with superimposed cellulitis. There was also some concern about skin hygiene as patient says she has not been taking showers due to fear of falling in her apartment. Patient was started on antibiotics and Diflucan.  She is now in SNF for therapy. Patient lives by herself in apartment she says she has daughters who check on her.  She walks with a walker and was driving before. She was c/o itching but no  other acute complains. Denies Sob, Cough or Chest Pain.  Past Medical History:  Diagnosis Date  . Anxiety   . Arthritis   . Hyperlipidemia   . Hypertension   . Psoriasis    Past Surgical History:  Procedure Laterality Date  . ABDOMINAL HYSTERECTOMY    . CHOLECYSTECTOMY    . HERNIA REPAIR    . KNEE SURGERY    . TUBAL LIGATION      reports that she has been smoking. She has never used smokeless tobacco. She reports that she does not drink alcohol or use drugs. Social History   Socioeconomic History  . Marital status: Single    Spouse name: Not on file  . Number of children: Not on file  . Years of education: Not on file  . Highest education level: Not on file  Occupational History  . Not on file  Social Needs  . Financial resource strain: Not on file  . Food insecurity:    Worry: Not on file    Inability: Not on file  . Transportation needs:    Medical: Not on file    Non-medical: Not on file  Tobacco Use  . Smoking status: Current Every Day Smoker  . Smokeless tobacco: Never Used  Substance and Sexual Activity  . Alcohol use: No    Frequency: Never  . Drug use: No  . Sexual activity: Not on file  Lifestyle  . Physical activity:    Days per week: Not on file    Minutes per  session: Not on file  . Stress: Not on file  Relationships  . Social connections:    Talks on phone: Not on file    Gets together: Not on file    Attends religious service: Not on file    Active member of club or organization: Not on file    Attends meetings of clubs or organizations: Not on file    Relationship status: Not on file  . Intimate partner violence:    Fear of current or ex partner: Not on file    Emotionally abused: Not on file    Physically abused: Not on file    Forced sexual activity: Not on file  Other Topics Concern  . Not on file  Social History Narrative  . Not on file    Functional Status Survey:    History reviewed. No pertinent family history.  Health  Maintenance  Topic Date Due  . MAMMOGRAM  05/03/2018 (Originally 02/16/1995)  . DEXA SCAN  05/03/2018 (Originally 02/15/2010)  . COLONOSCOPY  05/03/2018 (Originally 02/16/1995)  . TETANUS/TDAP  05/03/2018 (Originally 02/16/1964)  . Hepatitis C Screening  05/03/2018 (Originally 02/23/1946)  . PNA vac Low Risk Adult (1 of 2 - PCV13) 05/03/2018 (Originally 02/15/2010)  . INFLUENZA VACCINE  Completed    No Known Allergies  Outpatient Encounter Medications as of 04/06/2018  Medication Sig  . ALPRAZolam (XANAX) 0.5 MG tablet Take 1 tablet (0.5 mg total) by mouth 3 (three) times daily as needed for up to 12 days for anxiety. For anxiety.  . cephALEXin (KEFLEX) 500 MG capsule Take 1 capsule (500 mg total) by mouth 3 (three) times daily for 7 days.  . Cholecalciferol (VITAMIN D3) 1000 units CAPS Take by mouth.  . citalopram (CELEXA) 20 MG tablet Take 20 mg by mouth daily. For depression  . Clobetasol Prop Emollient Base (CLOBETASOL PROPIONATE E) 0.05 % emollient cream Apply 1 application topically 2 (two) times daily. Apply to ECZEMA areas  . Diclofenac Sodium 3 % GEL Place 4 g onto the skin 4 (four) times daily as needed.  . fluconazole (DIFLUCAN) 150 MG tablet Take 1 tablet (150 mg total) by mouth daily for 5 days.  Marland Kitchen nystatin (MYCOSTATIN/NYSTOP) powder Apply topically 3 (three) times daily for 14 days.  . Omega-3 Fatty Acids (FISH OIL) 1000 MG CAPS Take by mouth.  . rosuvastatin (CRESTOR) 20 MG tablet Take 1 tablet (20 mg total) by mouth daily.  . traMADol (ULTRAM) 50 MG tablet Take 2 tablets (100 mg total) by mouth every 6 (six) hours as needed for moderate pain. For pain   No facility-administered encounter medications on file as of 04/06/2018.      Review of Systems  Constitutional: Negative.   HENT: Negative.   Respiratory: Negative.   Cardiovascular: Positive for leg swelling.  Gastrointestinal: Negative.   Genitourinary: Negative.   Skin: Positive for rash.  Neurological: Positive  for weakness.  Psychiatric/Behavioral: Positive for sleep disturbance.    Vitals:   04/06/18 0859  BP: 104/70  Pulse: 77  Resp: 20  Temp: 97.6 F (36.4 C)  TempSrc: Oral   There is no height or weight on file to calculate BMI. Physical Exam  Constitutional: She is oriented to person, place, and time. She appears well-developed and well-nourished.  HENT:  Head: Normocephalic.  Mouth/Throat: Oropharynx is clear and moist.  Eyes: Pupils are equal, round, and reactive to light.  Neck: Neck supple.  Cardiovascular: Normal rate and regular rhythm.  Pulmonary/Chest: Effort normal and breath sounds  normal. No stridor. No respiratory distress. She has no wheezes.  Abdominal: Soft. Bowel sounds are normal. She exhibits no distension. There is no tenderness. There is no guarding.  Neurological: She is alert and oriented to person, place, and time.  No focal Deficits  Skin:  Had Rash under the Breast and in Groin area. Also on the folds of her Arms  Psychiatric: She has a normal mood and affect. Her behavior is normal. Thought content normal.    Labs reviewed: Basic Metabolic Panel: Recent Labs    03/29/18 1934 03/30/18 0504  04/02/18 0429 04/04/18 0415 04/06/18 0719  NA  --  139   < > 140 140 139  K  --  3.6   < > 3.2* 4.0 3.8  CL  --  96*   < > 106 103 98  CO2  --  35*   < > 28 30 32  GLUCOSE  --  93   < > 85 95 81  BUN  --  17   < > 10 8 17   CREATININE  --  1.18*   < > 0.90 0.85 1.03*  CALCIUM  --  8.5*   < > 8.3* 9.0 9.0  MG 1.2* 1.7  --   --   --   --    < > = values in this interval not displayed.   Liver Function Tests: Recent Labs    04/28/17 1111 03/29/18 1540  AST 16 19  ALT 12 10  ALKPHOS 64 48  BILITOT 0.3 1.1  PROT 6.8 7.1  ALBUMIN 4.1 3.0*   No results for input(s): LIPASE, AMYLASE in the last 8760 hours. No results for input(s): AMMONIA in the last 8760 hours. CBC: Recent Labs    07/02/17 0750 03/29/18 1540  03/31/18 0538 04/02/18 0429  04/04/18 0415  WBC 17.4* 20.5*   < > 15.3* 15.2* 13.9*  NEUTROABS 12.8* 15.4*  --   --   --  9.3*  HGB 12.4 12.7   < > 10.8* 10.3* 11.2*  HCT 38.4 40.0   < > 36.2 33.4* 36.3  MCV 96.7 102.0*   < > 104.0* 101.8* 101.7*  PLT 226 340   < > 300 302 324   < > = values in this interval not displayed.   Cardiac Enzymes: No results for input(s): CKTOTAL, CKMB, CKMBINDEX, TROPONINI in the last 8760 hours. BNP: Invalid input(s): POCBNP No results found for: HGBA1C Lab Results  Component Value Date   TSH 1.850 04/28/2017   No results found for: VITAMINB12 No results found for: FOLATE No results found for: IRON, TIBC, FERRITIN  Imaging and Procedures obtained prior to SNF admission: No results found.  Assessment/Plan  Candidiasis of skin/Groin/inguinal and infra-mammary Area Candida intertrigo with superimposed cellulitis Patient on Keflex for 7 days.  She is also on Diflucan Nystatin powder  3 times a day   Essential hypertension Her HCTZ was stopped in the hospital We will continue to monitor his blood pressure so far controlled  Leucocytosis Mildly elevated but she has a baseline elevated leukocytosis  Hyperlipidemia Continue on statin follows with her PCP  GAD (generalized anxiety disorder) We will decrease the dose of Xanax Also on Celexa  Fall,  Will work with therapy. H/o Psoriasis On Clobestasol  Hypokalemia Resolved HCTZ was stopped in the hospital  Family/ staff Communication:   Labs/tests ordered: Total time spent in this patient care encounter was 45_ minutes; greater than 50% of the visit spent counseling patient,  reviewing records , Labs and coordinating care for problems addressed at this encounter.

## 2018-04-08 ENCOUNTER — Other Ambulatory Visit: Payer: Self-pay

## 2018-04-08 DIAGNOSIS — F411 Generalized anxiety disorder: Secondary | ICD-10-CM

## 2018-04-08 DIAGNOSIS — M159 Polyosteoarthritis, unspecified: Secondary | ICD-10-CM

## 2018-04-08 DIAGNOSIS — M150 Primary generalized (osteo)arthritis: Principal | ICD-10-CM

## 2018-04-08 MED ORDER — ALPRAZOLAM 0.5 MG PO TABS: 0.5000 mg | ORAL_TABLET | Freq: Three times a day (TID) | ORAL | 0 refills | Status: DC | PRN

## 2018-04-08 MED ORDER — TRAMADOL HCL 50 MG PO TABS: 100.0000 mg | ORAL_TABLET | Freq: Four times a day (QID) | ORAL | 0 refills | Status: DC | PRN

## 2018-04-08 NOTE — Telephone Encounter (Signed)
RX Fax for [facility]@ [phone]  

## 2018-04-09 ENCOUNTER — Non-Acute Institutional Stay (SKILLED_NURSING_FACILITY): Payer: Medicare HMO | Admitting: Internal Medicine

## 2018-04-09 ENCOUNTER — Encounter: Payer: Self-pay | Admitting: Internal Medicine

## 2018-04-09 DIAGNOSIS — L409 Psoriasis, unspecified: Secondary | ICD-10-CM

## 2018-04-09 DIAGNOSIS — B372 Candidiasis of skin and nail: Secondary | ICD-10-CM | POA: Diagnosis not present

## 2018-04-09 DIAGNOSIS — D72829 Elevated white blood cell count, unspecified: Secondary | ICD-10-CM | POA: Diagnosis not present

## 2018-04-09 DIAGNOSIS — I10 Essential (primary) hypertension: Secondary | ICD-10-CM | POA: Diagnosis not present

## 2018-04-09 DIAGNOSIS — L0390 Cellulitis, unspecified: Secondary | ICD-10-CM | POA: Diagnosis not present

## 2018-04-09 NOTE — Progress Notes (Signed)
This is an acute visit.  Level care skilled.  Facility is [facility].  Chief complaint-acute visit follow-up psoriasis-.  History of present illness.  Patient is a pleasant 72 year old female seen today for follow-up of psoriasis.  She is been on low-dose prednisone in the past and patient family would like this restarted at least for short period.--She is complaining of increased itching and discomfort she attributes to psoriasis and says she has had significant relief with the prednisone in the past  Patient is here for rehab after hospitalization from November 17 to November 22 for cellulitis and weakness.  She also has a history of hypertension in addition to COPD hyperlipidemia and chronic leukocytosis.  Regards to weakness and fatigue she was found to be hypokalemic also had significant inframammary area and a groin rash with superimposed cellulitis.  There was some concern about skin hygiene as patient apparently had not been taking regular showers because she was afraid of falling in her apartment.  She was started on antibiotics as well as Diflucan.  She will be completing her cephalexin later today.--has  diclofenac sodium 4 times a day as needed  She also continues on nystatin 3 times a day as well   She has completed a course of Diflucan.  Patient and her daughter states she has been on prednisone in the past-and I did review notes [facility] and appears she had been on an extended course of prednisone 10 mg for the psoriasis.  Patient states this did help-and she would like it restarted at least for short period of possible   Currently she has no other complaints and vital signs appear to be stable.  Past Medical History:  Diagnosis Date  . Anxiety   . Arthritis   . Hyperlipidemia   . Hypertension   . Psoriasis         Past Surgical History:  Procedure Laterality Date  . ABDOMINAL HYSTERECTOMY    .  CHOLECYSTECTOMY    . HERNIA REPAIR    . KNEE SURGERY    . TUBAL LIGATION      reports that she has been smoking. She has never used smokeless tobacco. She reports that she does not drink alcohol or use drugs. Social History        Socioeconomic History  . Marital status: Single    Spouse name: Not on file  . Number of children: Not on file  . Years of education: Not on file  . Highest education level: Not on file  Occupational History  . Not on file  Social Needs  . Financial resource strain: Not on file  . Food insecurity:    Worry: Not on file    Inability: Not on file  . Transportation needs:    Medical: Not on file    Non-medical: Not on file  Tobacco Use  . Smoking status: Current Every Day Smoker  . Smokeless tobacco: Never Used  Substance and Sexual Activity  . Alcohol use: No    Frequency: Never  . Drug use: No  . Sexual activity: Not on file  Lifestyle  . Physical activity:    Days per week: Not on file    Minutes per session: Not on file  . Stress: Not on file  Relationships  . Social connections:    Talks on phone: Not on file    Gets together: Not on file    Attends religious service: Not on file  Active member of club or organization: Not on file    Attends meetings of clubs or organizations: Not on file    Relationship status: Not on file  . Intimate partner violence:    Fear of current or ex partner: Not on file    Emotionally abused: Not on file    Physically abused: Not on file    Forced sexual activity: Not on file  Other Topics Concern  . Not on file  Social History Narrative  . Not on file    Functional Status Survey:  History reviewed. No pertinent family history.      Health Maintenance  Topic Date Due  . MAMMOGRAM  05/03/2018 (Originally 02/16/1995)  . DEXA SCAN  05/03/2018 (Originally 02/15/2010)  . COLONOSCOPY  05/03/2018 (Originally 02/16/1995)  . TETANUS/TDAP  05/03/2018  (Originally 02/16/1964)  . Hepatitis C Screening  05/03/2018 (Originally [birth year])  . PNA vac Low Risk Adult (1 of 2 - PCV13) 05/03/2018 (Originally 02/15/2010)  . INFLUENZA VACCINE  Completed    No Known Allergies    MEDICATIONS     Medication Sig  . ALPRAZolam (XANAX) 0.5 MG tablet Take 1 tablet (0.5 mg total) by mouth 3 (three) times daily as needed for up to 12 days for anxiety. For anxiety.  . cephALEXin (KEFLEX) 500 MG capsule Take 1 capsule (500 mg total) by mouth 3 (three) times daily for 7 days.  . Cholecalciferol (VITAMIN D3) 1000 units CAPS Take by mouth.  . citalopram (CELEXA) 20 MG tablet Take 20 mg by mouth daily. For depression  . Clobetasol Prop Emollient Base (CLOBETASOL PROPIONATE E) 0.05 % emollient cream Apply 1 application topically 2 (two) times daily. Apply to ECZEMA areas  . Diclofenac Sodium 3 % GEL Place 4 g onto the skin 4 (four) times daily as needed.  .     . nystatin (MYCOSTATIN/NYSTOP) powder Apply topically 3 (three) times daily for 14 days.  . Omega-3 Fatty Acids (FISH OIL) 1000 MG CAPS Take by mouth.  . rosuvastatin (CRESTOR) 20 MG tablet Take 1 tablet (20 mg total) by mouth daily.  . traMADol (ULTRAM) 50 MG tablet Take 2 tablets (100 mg total) by mouth every 6 (six) hours as needed for moderate pain. For pain   Review of systems. General is not complaining of fever chills.  Skin positive for extensive rash and psoriasis as noted above says she does have some itching.  Head ears eyes nose mouth and throat is not complaining of visual changes or sore throat.  Respiratory is not complaining of shortness of breath or cough.  Cardiac denies chest pain does have lower extremity edema.  GI is not complaining of abdominal pain nausea vomiting diarrhea constipation.  GU is not really complaining of dysuria.  Muscular skeletal has weakness is not really complaining of joint pain at this time.  And psych overtly depressed or anxious appears to be  in good spirits actually just got back from visiting with family for Thanksgiving     Physical exam. Temperature is 98.2 pulse 85 respiration 17 blood pressure 141/79 weight is 190.4.  General this is a pleasant elderly female in no distress sitting comfortably in her wheelchair.  Her skin is warm and dry she does have a rash under her breasts and in the groin areas this appears to be beefy red also on the folds of her arms--- she does have psoriatic scaled lesions most prominently of her lower back also somewhat in her groin area and on her  right leg.  Eyes visual acuity appears to be intact sclera and conjunctive are clear.  Oropharynx clear mucous membranes moist.  Chest is clear to auscultation there is no labored breathing somewhat shallow air entry.  Heart is regular rate and rhythm without murmur gallop or rub she has moderate lower extremity edema bilaterally.  Abdomen is obese soft nontender with positive bowel sounds.  Musculoskeletal is able to move all extremities x4 is able to transfer without assistance.  Neurologic is grossly intact her speech is clear no lateralizing findings.  Psych she is grossly alert and oriented pleasant and appropriate.  Labs.  April 06, 2018.  BNP was 48.  Sodium 139 potassium 3.8 BUN 17 creatinine 1.03.  April 04, 2018.  WBC 13.9-hemoglobin 11.2-platelets 324.  Assessment and plan.  1.  History of candidiasis of the skin groin inguinal and inframammary areas- with superimposed cellulitis she is completing a course of Keflex she is also completed a course of Diflucan continues on nystatin powder as well as clobetasol twice daily- she also has nystatin 3 times daily through December 5.  Continues to have significant rash at this point will monitor she has been afebrile this will have to be monitored closely.  2.-  History of psoriasis she does have an extended history of this I have reviewed primary care provider notes she has  been on prednisone in the past and she says this is been well-tolerated and helpe-- will restart prednisone 10 mg a day for short course of 5 days and then reduce to 5 mg a day and reevaluate. Would like to avoid long-term use if possible- she says she has had some increased itching and discomfort and prednisone has helped in the past   I suspect this will continue to be a challenge. She continues as well on Clobetasol  Twice a day  -  3 of hypertension systolic is mildlyelevated today in the low 140s her hydrochlorothiazide was stopped in the hospital-I do not see consistent elevations previous blood pressures have been in the lower 100s- systolically-I suspect the excitement of Thanksgiving and her increased activities may have something to do with today's higher reading but will monitor.  4.  Leukocytosis this is chronically elevated last white count was 13,900 on November 23 update lab is pending for early next week if not febrile does not give sign of a septic process at this point.  5.  History of anxiety her Xanax was recently decreased she appears to be tolerating this well she is also on Celexa for coexistent depression.  6 hypokalemia was resolved her hydrochlorothiazide was stopped in the hospital potassium was 3.8 on lab done on November 25 and update lab is pending for December 2.  [id number]

## 2018-04-13 ENCOUNTER — Encounter (HOSPITAL_COMMUNITY)
Admission: RE | Admit: 2018-04-13 | Discharge: 2018-04-13 | Disposition: A | Discharge status: Home / Self Care | Payer: Medicare HMO | Source: Skilled Nursing Facility | Attending: Internal Medicine | Admitting: Internal Medicine

## 2018-04-13 ENCOUNTER — Encounter: Payer: Self-pay | Admitting: Internal Medicine

## 2018-04-13 ENCOUNTER — Non-Acute Institutional Stay (SKILLED_NURSING_FACILITY): Payer: Medicare HMO | Admitting: Internal Medicine

## 2018-04-13 DIAGNOSIS — D72829 Elevated white blood cell count, unspecified: Secondary | ICD-10-CM | POA: Diagnosis not present

## 2018-04-13 DIAGNOSIS — I10 Essential (primary) hypertension: Secondary | ICD-10-CM | POA: Insufficient documentation

## 2018-04-13 DIAGNOSIS — B372 Candidiasis of skin and nail: Secondary | ICD-10-CM | POA: Diagnosis not present

## 2018-04-13 DIAGNOSIS — L409 Psoriasis, unspecified: Secondary | ICD-10-CM

## 2018-04-13 LAB — CBC WITH DIFFERENTIAL/PLATELET
ABS IMMATURE GRANULOCYTES: 0.13 10*3/uL — AB (ref 0.00–0.07)
Basophils Absolute: 0.1 10*3/uL (ref 0.0–0.1)
Basophils Relative: 1 %
EOS PCT: 6 %
Eosinophils Absolute: 1.2 10*3/uL — ABNORMAL HIGH (ref 0.0–0.5)
HCT: 42.8 % (ref 36.0–46.0)
HEMOGLOBIN: 12.7 g/dL (ref 12.0–15.0)
Immature Granulocytes: 1 %
LYMPHS PCT: 31 %
Lymphs Abs: 6.1 10*3/uL — ABNORMAL HIGH (ref 0.7–4.0)
MCH: 30.8 pg (ref 26.0–34.0)
MCHC: 29.7 g/dL — AB (ref 30.0–36.0)
MCV: 103.6 fL — ABNORMAL HIGH (ref 80.0–100.0)
MONO ABS: 1 10*3/uL (ref 0.1–1.0)
Monocytes Relative: 5 %
NEUTROS ABS: 10.8 10*3/uL — AB (ref 1.7–7.7)
Neutrophils Relative %: 56 %
Platelets: 325 10*3/uL (ref 150–400)
RBC: 4.13 MIL/uL (ref 3.87–5.11)
RDW: 13.2 % (ref 11.5–15.5)
WBC: 19.3 10*3/uL — AB (ref 4.0–10.5)
nRBC: 0 % (ref 0.0–0.2)

## 2018-04-13 LAB — BASIC METABOLIC PANEL
Anion gap: 8 (ref 5–15)
BUN: 21 mg/dL (ref 8–23)
CHLORIDE: 106 mmol/L (ref 98–111)
CO2: 29 mmol/L (ref 22–32)
CREATININE: 0.88 mg/dL (ref 0.44–1.00)
Calcium: 9.3 mg/dL (ref 8.9–10.3)
GFR calc Af Amer: >60 mL/min (ref >60)
Glucose, Bld: 84 mg/dL (ref 70–99)
Potassium: 4.2 mmol/L (ref 3.5–5.1)
SODIUM: 143 mmol/L (ref 135–145)

## 2018-04-13 NOTE — Progress Notes (Signed)
Location:    [facility] Room Number: 116/D Place of Service:  SNF (31) Provider:  Sheran Luz, PA-C  Patient Care Team: Caryl Never as PCP - General (Physician Assistant) Magrinat, Valentino Hue, MD as Consulting Physician (Oncology) Toma Deiters, MD (Internal Medicine) Michae Kava, MD as Consulting Physician (Internal Medicine)  Extended Emergency Contact Information Primary Emergency Contact: Hill,DENISE Address: [address]          [place],  [address] Home Phone: [phone] Relation: None  Code Status:  Full Code Goals of care: Advanced Directive information Advanced Directives 04/13/2018  Does Patient Have a Medical Advance Directive? Yes  Type of Advance Directive (No Data)  Does patient want to make changes to medical advance directive? No - Patient declined  Would patient like information on creating a medical advance directive? No - Patient declined     Chief Complaint  Patient presents with  . Acute Visit    Leukocytosis    HPI:  Pt is a 72 y.o. female seen today for an acute visit for    Past Medical History:  Diagnosis Date  . Anxiety   . Arthritis   . Hyperlipidemia   . Hypertension   . Psoriasis    Past Surgical History:  Procedure Laterality Date  . ABDOMINAL HYSTERECTOMY    . CHOLECYSTECTOMY    . HERNIA REPAIR    . KNEE SURGERY    . TUBAL LIGATION      No Known Allergies  Outpatient Encounter Medications as of 04/13/2018  Medication Sig  . ALPRAZolam (XANAX) 0.5 MG tablet Take 0.5 mg by mouth every 12 (twelve) hours as needed for anxiety.  . Cholecalciferol (VITAMIN D3) 1000 units CAPS Take by mouth.  . citalopram (CELEXA) 20 MG tablet Take 20 mg by mouth daily. For depression  . Clobetasol Prop Emollient Base (CLOBETASOL PROPIONATE E) 0.05 % emollient cream Apply 1 application topically 2 (two) times daily. Apply to ECZEMA areas  . Diclofenac Sodium 3 % GEL Place 4 g onto the skin 4  (four) times daily as needed.  . nystatin (MYCOSTATIN/NYSTOP) powder Apply topically 3 (three) times daily for 14 days.  . Omega-3 Fatty Acids (FISH OIL) 1000 MG CAPS Take by mouth.  . predniSONE (DELTASONE) 10 MG tablet Take 10 mg by mouth daily with breakfast. From 04/10/2018-04/14/2018  . predniSONE (DELTASONE) 5 MG tablet Take 5 mg by mouth daily with breakfast. From 04/15/2018-04/19/2018  . Probiotic Product (RISA-BID PROBIOTIC) TABS Take 1 tablet by mouth twice a day  . rosuvastatin (CRESTOR) 20 MG tablet Take 1 tablet (20 mg total) by mouth daily.  . traMADol (ULTRAM) 50 MG tablet Take 2 tablets (100 mg total) by mouth every 6 (six) hours as needed for moderate pain. For pain  . [DISCONTINUED] ALPRAZolam (XANAX) 0.5 MG tablet Take 1 tablet (0.5 mg total) by mouth 3 (three) times daily as needed for up to 12 days for anxiety. For anxiety. (Patient taking differently: Take 0.5 mg by mouth every 12 (twelve) hours as needed for anxiety. For anxiety.)   No facility-administered encounter medications on file as of 04/13/2018.     Review of Systems  Immunization History  Administered Date(s) Administered  . Influenza Inj Mdck Quad Pf 02/26/2017  . Influenza, High Dose Seasonal PF 02/17/2018   Pertinent  Health Maintenance Due  Topic Date Due  . MAMMOGRAM  05/03/2018 (Originally 02/16/1995)  . DEXA SCAN  05/03/2018 (Originally 02/15/2010)  .  COLONOSCOPY  05/03/2018 (Originally 02/16/1995)  . PNA vac Low Risk Adult (1 of 2 - PCV13) 05/03/2018 (Originally 02/15/2010)  . INFLUENZA VACCINE  Completed   Fall Risk  02/17/2018 11/26/2017 11/19/2017 10/17/2017 10/09/2017  Falls in the past year? No Yes Yes Yes Yes  Number falls in past yr: - 2 or more 2 or more 2 or more 2 or more  Injury with Fall? - Yes Yes Yes Yes  Comment - - - - Fell, injured knee.  Risk Factor Category  - High Fall Risk High Fall Risk - -   Functional Status Survey:    There were no vitals filed for this visit. There is no  height or weight on file to calculate BMI. Physical Exam  Labs reviewed: Recent Labs    03/29/18 1934 03/30/18 0504  04/04/18 0415 04/06/18 0719 04/13/18 0730  NA  --  139   < > 140 139 143  K  --  3.6   < > 4.0 3.8 4.2  CL  --  96*   < > 103 98 106  CO2  --  35*   < > 30 32 29  GLUCOSE  --  93   < > 95 81 84  BUN  --  17   < > 8 17 21   CREATININE  --  1.18*   < > 0.85 1.03* 0.88  CALCIUM  --  8.5*   < > 9.0 9.0 9.3  MG 1.2* 1.7  --   --   --   --    < > = values in this interval not displayed.   Recent Labs    04/28/17 1111 03/29/18 1540  AST 16 19  ALT 12 10  ALKPHOS 64 48  BILITOT 0.3 1.1  PROT 6.8 7.1  ALBUMIN 4.1 3.0*   Recent Labs    03/29/18 1540  04/02/18 0429 04/04/18 0415 04/13/18 0730  WBC 20.5*   < > 15.2* 13.9* 19.3*  NEUTROABS 15.4*  --   --  9.3* 10.8*  HGB 12.7   < > 10.3* 11.2* 12.7  HCT 40.0   < > 33.4* 36.3 42.8  MCV 102.0*   < > 101.8* 101.7* 103.6*  PLT 340   < > 302 324 325   < > = values in this interval not displayed.   Lab Results  Component Value Date   TSH 1.850 04/28/2017   No results found for: HGBA1C Lab Results  Component Value Date   CHOL 144 04/28/2017   HDL 47 04/28/2017   LDLCALC 58 04/28/2017   TRIG 195 (H) 04/28/2017   CHOLHDL 3.1 04/28/2017    Significant Diagnostic Results in last 30 days:  Dg Chest 2 View  Result Date: 03/29/2018 CLINICAL DATA:  Hypoxia. EXAM: CHEST - 2 VIEW COMPARISON:  None. FINDINGS: The heart size and mediastinal contours are within normal limits. Both lungs are clear. The visualized skeletal structures are unremarkable. IMPRESSION: No active cardiopulmonary disease. Electronically Signed   By: Obie DredgeWilliam T Derry M.D.   On: 03/29/2018 16:26   Dg Pelvis 1-2 Views  Result Date: 03/29/2018 CLINICAL DATA:  Bilateral hip pain. EXAM: PELVIS - 1-2 VIEW COMPARISON:  None. FINDINGS: Bones are diffusely demineralized. SI joints and symphysis pubis unremarkable. No evidence for an acute fracture.  IMPRESSION: Negative. Electronically Signed   By: Kennith CenterEric  Mansell M.D.   On: 03/29/2018 16:29   Ct Head Wo Contrast  Result Date: 03/29/2018 CLINICAL DATA:  Weakness for the past 3  days. EXAM: CT HEAD WITHOUT CONTRAST TECHNIQUE: Contiguous axial images were obtained from the base of the skull through the vertex without intravenous contrast. COMPARISON:  None. FINDINGS: Brain: Mild-to-moderate enlargement of the ventricles and subarachnoid spaces. Moderate patchy white matter low density in both cerebral hemispheres. No intracranial hemorrhage, mass lesion or CT evidence of acute infarction. Vascular: No hyperdense vessel or unexpected calcification. Skull: Normal. Negative for fracture or focal lesion. Sinuses/Orbits: Unremarkable. Other: None. IMPRESSION: 1. No acute abnormality. 2. Mild to moderate diffuse cerebral and cerebellar atrophy. 3. Moderate chronic small vessel white matter ischemic changes in both cerebral hemispheres. Electronically Signed   By: Beckie Salts M.D.   On: 03/29/2018 16:40   Dg Foot Complete Right  Result Date: 03/30/2018 CLINICAL DATA:  Foot pain. EXAM: RIGHT FOOT COMPLETE - 3+ VIEW COMPARISON:  None. FINDINGS: The bones appear osteopenic. There are no acute fractures or dislocations identified. Plantar heel spur noted. Mild diffuse soft tissue edema. IMPRESSION: 1. No acute bone abnormality. 2. Mild soft tissue edema. 3. Plantar heel spur Electronically Signed   By: Signa Kell M.D.   On: 03/30/2018 16:20    Assessment/Plan There are no diagnoses linked to this encounter.      London Sheer, [place] [phone] This encounter was created in error - please disregard.

## 2018-04-13 NOTE — Progress Notes (Signed)
Location:    [facility] Room Number: 116/D Place of Service:  SNF (31) Provider:  Sheran LuzArlo Bethlehem Langstaff  Jones, Angel S, PA-C  Patient Care Team: Caryl NeverJones, Angel S, PA-C as PCP - General (Physician Assistant) Magrinat, Valentino HueGustav C, MD as Consulting Physician (Oncology) Toma DeitersHasanaj, Xaje A, MD (Internal Medicine) Michae Kavaarovsky, Boris M, MD as Consulting Physician (Internal Medicine)  Extended Emergency Contact Information Primary Emergency Contact: Hill,DENISE Address: [address]          [place],  [id number] Home Phone: [phone] Relation: None  Code Status:  Full Code Goals of care: Advanced Directive information Advanced Directives 04/13/2018  Does Patient Have a Medical Advance Directive? Yes  Type of Advance Directive (No Data)  Does patient want to make changes to medical advance directive? No - Patient declined  Would patient like information on creating a medical advance directive? No - Patient declined     Chief Complaint  Patient presents with  . Acute Visit    Leukocytosis  As well as follow-up for candidiasis of the skin groin and inguinal areas as well as inframammary areas with coexistent yeast infection.    HPI:  Pt is a 72 y.o. female seen today for an acute visit for follow-up of leukocytosis- as well as psoriasis  She was admitted here for short-term rehab after hospitalization for cellulitis and weakness.  She also has a history of hypertension in addition to COPD hyperlipidemia chronic leukocytosis.  She went to the hospital for weakness and fatigue as well as recurrent falls.  She also was hypokalemic.  She was noted to have inframammary area and groin rash with superimposed cellulitis.  She was started on antibiotics and Diflucan which she is completed she continues on nystatin as well as clobetasol for coexistent psoriasis.  I saw her last week because she says she was having itching and irritation with her psoriatic lesions and that she had  been on low-dose prednisone before which offered significant relief.  She has been started on 10 mg of prednisone for 5 days and then will be reduced to 5 mg for 5 days.  She says she is more comfortable and she feels the prednisone is helping.  This is complicated somewhat with a history of chronic leukocytosis which is been followed by hematology-her white count is up today and 19,300 she has had recent count it appears ranging from 13,000 Seri up to 20,000 area.  I suspect there prednisone may be contributing to additional leukocytosis.  She is not complaining of any fever chills coughing dysuria says she has had a couple loose bowel movements but feels as because of something she ate she is not complaining of any nausea or abdominal pain.  Vital signs are stable she is afebrile.   Past Medical History:  Diagnosis Date  . Anxiety   . Arthritis   . Hyperlipidemia   . Hypertension   . Psoriasis    Past Surgical History:  Procedure Laterality Date  . ABDOMINAL HYSTERECTOMY    . CHOLECYSTECTOMY    . HERNIA REPAIR    . KNEE SURGERY    . TUBAL LIGATION      No Known Allergies  Outpatient Encounter Medications as of 04/13/2018  Medication Sig  . ALPRAZolam (XANAX) 0.5 MG tablet Take 0.5 mg by mouth every 12 (twelve) hours as needed for anxiety.  . Cholecalciferol (VITAMIN D3) 1000 units CAPS Take by mouth.  . citalopram (CELEXA) 20 MG tablet Take 20 mg by mouth daily.  For depression  . Clobetasol Prop Emollient Base (CLOBETASOL PROPIONATE E) 0.05 % emollient cream Apply 1 application topically 2 (two) times daily. Apply to ECZEMA areas  . Diclofenac Sodium 3 % GEL Place 4 g onto the skin 4 (four) times daily as needed.  . nystatin (MYCOSTATIN/NYSTOP) powder Apply topically 3 (three) times daily for 14 days.  . Omega-3 Fatty Acids (FISH OIL) 1000 MG CAPS Take by mouth.  . predniSONE (DELTASONE) 10 MG tablet Take 10 mg by mouth daily with breakfast. From 04/10/2018-04/14/2018  .  predniSONE (DELTASONE) 5 MG tablet Take 5 mg by mouth daily with breakfast. From 04/15/2018-04/19/2018  . Probiotic Product (RISA-BID PROBIOTIC) TABS Take 1 tablet by mouth twice a day  . rosuvastatin (CRESTOR) 20 MG tablet Take 1 tablet (20 mg total) by mouth daily.  . traMADol (ULTRAM) 50 MG tablet Take 2 tablets (100 mg total) by mouth every 6 (six) hours as needed for moderate pain. For pain   No facility-administered encounter medications on file as of 04/13/2018.     Review of Systems   In general she is not complaining of any fever chills says she feels well her main concern is she wants to go home.  Skin has a complicated history as noted above but says her itching and discomfort has improved she thinks the prednisone is helping in this regards.  Head ears eyes nose mouth and throat is not complain any visual changes or sore throat.  Respiratory does not complain of shortness of breath or cough.  Cardiac denies chest pain she does have chronic leg edema.  GI she is not complaining of any abdominal pain nausea vomiting diarrhea constipation.  GU denies dysuria.  Musculoskeletal has some weakness but says she is getting stronger and feels she is nearing the point where she can go home with assistance.  Does not complain of joint pain currently.  Neurologic is not complaining of dizziness headache or numbness.  And psych appears to be in good spirits does not complain of being overtly anxious or depressed  Immunization History  Administered Date(s) Administered  . Influenza Inj Mdck Quad Pf 02/26/2017  . Influenza, High Dose Seasonal PF 02/17/2018   Pertinent  Health Maintenance Due  Topic Date Due  . MAMMOGRAM  05/03/2018 (Originally 02/16/1995)  . DEXA SCAN  05/03/2018 (Originally 02/15/2010)  . COLONOSCOPY  05/03/2018 (Originally 02/16/1995)  . PNA vac Low Risk Adult (1 of 2 - PCV13) 05/03/2018 (Originally 02/15/2010)  . INFLUENZA VACCINE  Completed   Fall Risk   02/17/2018 11/26/2017 11/19/2017 10/17/2017 10/09/2017  Falls in the past year? No Yes Yes Yes Yes  Number falls in past yr: - 2 or more 2 or more 2 or more 2 or more  Injury with Fall? - Yes Yes Yes Yes  Comment - - - - Fell, injured knee.  Risk Factor Category  - High Fall Risk High Fall Risk - -   Functional Status Survey:     Temperature is 97.6 pulse 76 respirations 20 blood pressure 106/63 taken manually was 114/64-oxygen saturation is 97% on room air.   Physical Exam   In general this is a pleasant elderly female in no distress sitting comfortably in her wheelchair.  Her skin and warm and dry continues to have a rash under her breasts and in the groin areas and inguinal areas this appears actually to be less erythematous than when I saw a few days ago.  She continues to have sporadic scaled lesions most  prominently of her lower back and into her groin area and right leg these appear stable.  Eyes visual acuity appears intact sclera and conjunctive are clear.  Oropharynx is clear mucous membranes moist.  Chest continues to be clear to auscultation there is no labored breathing air entry is somewhat shallow.  Heart somewhat distant heart sounds regular rate and rhythm without murmur gallop or rub continues on moderate lower extremity edema bilaterally.  Her abdomen is obese soft nontender with positive bowel sounds.  Muscular skeletal moves all her extremities x4 at baseline is able to stand without assistance and transfer.  Neurologically grossly intact her speech is clear no lateralizing findings.  Psych she is alert and oriented pleasant and appropriate   Labs reviewed: Recent Labs    03/29/18 1934 03/30/18 0504  04/04/18 0415 04/06/18 0719 04/13/18 0730  NA  --  139   < > 140 139 143  K  --  3.6   < > 4.0 3.8 4.2  CL  --  96*   < > 103 98 106  CO2  --  35*   < > 30 32 29  GLUCOSE  --  93   < > 95 81 84  BUN  --  17   < > 8 17 21   CREATININE  --  1.18*   < >  0.85 1.03* 0.88  CALCIUM  --  8.5*   < > 9.0 9.0 9.3  MG 1.2* 1.7  --   --   --   --    < > = values in this interval not displayed.   Recent Labs    04/28/17 1111 03/29/18 1540  AST 16 19  ALT 12 10  ALKPHOS 64 48  BILITOT 0.3 1.1  PROT 6.8 7.1  ALBUMIN 4.1 3.0*   Recent Labs    03/29/18 1540  04/02/18 0429 04/04/18 0415 04/13/18 0730  WBC 20.5*   < > 15.2* 13.9* 19.3*  NEUTROABS 15.4*  --   --  9.3* 10.8*  HGB 12.7   < > 10.3* 11.2* 12.7  HCT 40.0   < > 33.4* 36.3 42.8  MCV 102.0*   < > 101.8* 101.7* 103.6*  PLT 340   < > 302 324 325   < > = values in this interval not displayed.   Lab Results  Component Value Date   TSH 1.850 04/28/2017   No results found for: HGBA1C Lab Results  Component Value Date   CHOL 144 04/28/2017   HDL 47 04/28/2017   LDLCALC 58 04/28/2017   TRIG 195 (H) 04/28/2017   CHOLHDL 3.1 04/28/2017    Significant Diagnostic Results in last 30 days:  Dg Chest 2 View  Result Date: 03/29/2018 CLINICAL DATA:  Hypoxia. EXAM: CHEST - 2 VIEW COMPARISON:  None. FINDINGS: The heart size and mediastinal contours are within normal limits. Both lungs are clear. The visualized skeletal structures are unremarkable. IMPRESSION: No active cardiopulmonary disease. Electronically Signed   By: Obie Dredge M.D.   On: 03/29/2018 16:26   Dg Pelvis 1-2 Views  Result Date: 03/29/2018 CLINICAL DATA:  Bilateral hip pain. EXAM: PELVIS - 1-2 VIEW COMPARISON:  None. FINDINGS: Bones are diffusely demineralized. SI joints and symphysis pubis unremarkable. No evidence for an acute fracture. IMPRESSION: Negative. Electronically Signed   By: Kennith Center M.D.   On: 03/29/2018 16:29   Ct Head Wo Contrast  Result Date: 03/29/2018 CLINICAL DATA:  Weakness for the past 3 days. EXAM: CT  HEAD WITHOUT CONTRAST TECHNIQUE: Contiguous axial images were obtained from the base of the skull through the vertex without intravenous contrast. COMPARISON:  None. FINDINGS: Brain:  Mild-to-moderate enlargement of the ventricles and subarachnoid spaces. Moderate patchy white matter low density in both cerebral hemispheres. No intracranial hemorrhage, mass lesion or CT evidence of acute infarction. Vascular: No hyperdense vessel or unexpected calcification. Skull: Normal. Negative for fracture or focal lesion. Sinuses/Orbits: Unremarkable. Other: None. IMPRESSION: 1. No acute abnormality. 2. Mild to moderate diffuse cerebral and cerebellar atrophy. 3. Moderate chronic small vessel white matter ischemic changes in both cerebral hemispheres. Electronically Signed   By: Beckie Salts M.D.   On: 03/29/2018 16:40   Dg Foot Complete Right  Result Date: 03/30/2018 CLINICAL DATA:  Foot pain. EXAM: RIGHT FOOT COMPLETE - 3+ VIEW COMPARISON:  None. FINDINGS: The bones appear osteopenic. There are no acute fractures or dislocations identified. Plantar heel spur noted. Mild diffuse soft tissue edema. IMPRESSION: 1. No acute bone abnormality. 2. Mild soft tissue edema. 3. Plantar heel spur Electronically Signed   By: Signa Kell M.D.   On: 03/30/2018 16:20    Assessment/Plan  #1- history of psoriasis-she is on prednisone she has 1 more day of 10 mg and then will have 5 additional days of 5 mg she feels this is helping-at this point will continue to monitor also continues on Clobetasol--.  2.  History of cellulitis- candidiasis infection- she continues to have a fairly significant rash in the inframammary area as well as groin and inguinal areas however this appears to be less inflammatory she says she is feeling better discomfort has improved at this point will monitor she has completed her antibiotics and Diflucan for now.  3.  History of leukocytosis- this is chronic it tends to ebb and flow it appears she is now on prednisone which I suspect is contributing to leukocytosis she does not exhibit signs of infection at this point will monitor and update labs in several days.  She states she  wants to go home this week so will update on Thursday in case she is possibly discharge later in the week.  4.-History of hypokalemia this has resolved with supplementation potassium was 4.2 on lab done today will update a BMP as well later this week.  5.  Diarrhea-she says this is transitory and thinks is from something she ate if this persists however would warrant a C. difficile culture.  [id number]

## 2018-04-16 ENCOUNTER — Encounter: Payer: Self-pay | Admitting: Internal Medicine

## 2018-04-16 ENCOUNTER — Encounter (HOSPITAL_COMMUNITY)
Admission: RE | Admit: 2018-04-16 | Discharge: 2018-04-16 | Disposition: A | Discharge status: Home / Self Care | Payer: Medicare HMO | Source: Skilled Nursing Facility | Attending: Internal Medicine | Admitting: Internal Medicine

## 2018-04-16 ENCOUNTER — Non-Acute Institutional Stay (SKILLED_NURSING_FACILITY): Payer: Medicare HMO | Admitting: Internal Medicine

## 2018-04-16 DIAGNOSIS — L409 Psoriasis, unspecified: Secondary | ICD-10-CM | POA: Insufficient documentation

## 2018-04-16 DIAGNOSIS — L03314 Cellulitis of groin: Secondary | ICD-10-CM | POA: Insufficient documentation

## 2018-04-16 DIAGNOSIS — M6281 Muscle weakness (generalized): Secondary | ICD-10-CM | POA: Insufficient documentation

## 2018-04-16 DIAGNOSIS — D72829 Elevated white blood cell count, unspecified: Secondary | ICD-10-CM | POA: Diagnosis not present

## 2018-04-16 DIAGNOSIS — R531 Weakness: Secondary | ICD-10-CM

## 2018-04-16 DIAGNOSIS — B372 Candidiasis of skin and nail: Secondary | ICD-10-CM

## 2018-04-16 DIAGNOSIS — F411 Generalized anxiety disorder: Secondary | ICD-10-CM

## 2018-04-16 DIAGNOSIS — M1990 Unspecified osteoarthritis, unspecified site: Secondary | ICD-10-CM | POA: Insufficient documentation

## 2018-04-16 DIAGNOSIS — I10 Essential (primary) hypertension: Secondary | ICD-10-CM

## 2018-04-16 LAB — CBC WITH DIFFERENTIAL/PLATELET
Abs Immature Granulocytes: 0.13 10*3/uL — ABNORMAL HIGH (ref 0.00–0.07)
BASOS ABS: 0.1 10*3/uL (ref 0.0–0.1)
Basophils Relative: 1 %
Eosinophils Absolute: 0.9 10*3/uL — ABNORMAL HIGH (ref 0.0–0.5)
Eosinophils Relative: 4 %
HCT: 40.7 % (ref 36.0–46.0)
Hemoglobin: 12.5 g/dL (ref 12.0–15.0)
IMMATURE GRANULOCYTES: 1 %
LYMPHS ABS: 5.1 10*3/uL — AB (ref 0.7–4.0)
Lymphocytes Relative: 24 %
MCH: 30.8 pg (ref 26.0–34.0)
MCHC: 30.7 g/dL (ref 30.0–36.0)
MCV: 100.2 fL — ABNORMAL HIGH (ref 80.0–100.0)
Monocytes Absolute: 1.1 10*3/uL — ABNORMAL HIGH (ref 0.1–1.0)
Monocytes Relative: 5 %
NEUTROS ABS: 14.1 10*3/uL — AB (ref 1.7–7.7)
NEUTROS PCT: 65 %
NRBC: 0 % (ref 0.0–0.2)
PLATELETS: 306 10*3/uL (ref 150–400)
RBC: 4.06 MIL/uL (ref 3.87–5.11)
RDW: 13.2 % (ref 11.5–15.5)
WBC: 21.4 10*3/uL — ABNORMAL HIGH (ref 4.0–10.5)

## 2018-04-16 LAB — BASIC METABOLIC PANEL
ANION GAP: 7 (ref 5–15)
BUN: 26 mg/dL — AB (ref 8–23)
CHLORIDE: 104 mmol/L (ref 98–111)
CO2: 29 mmol/L (ref 22–32)
CREATININE: 0.89 mg/dL (ref 0.44–1.00)
Calcium: 9.4 mg/dL (ref 8.9–10.3)
GFR calc Af Amer: >60 mL/min (ref >60)
GFR calc non Af Amer: >60 mL/min (ref >60)
GLUCOSE: 92 mg/dL (ref 70–99)
Potassium: 4.2 mmol/L (ref 3.5–5.1)
Sodium: 140 mmol/L (ref 135–145)

## 2018-04-16 NOTE — Progress Notes (Signed)
Location:    [facility] Room Number: 116/D Place of Service:  SNF (31)  Provider:Advit Trethewey Threasa Heads  PCP: Remus Loffler, PA-C Patient Care Team: Caryl Never as PCP - General (Physician Assistant) Magrinat, Valentino Hue, MD as Consulting Physician (Oncology) Toma Deiters, MD (Internal Medicine) Michae Kava, MD as Consulting Physician (Internal Medicine)  Extended Emergency Contact Information Primary Emergency Contact: Hill,DENISE Address: [address]          [place],  [address] Home Phone: [phone] Relation: None  Code Status: Full Code Goals of care:  Advanced Directive information Advanced Directives 04/16/2018  Does Patient Have a Medical Advance Directive? Yes  Type of Advance Directive (No Data)  Does patient want to make changes to medical advance directive? No - Patient declined  Would patient like information on creating a medical advance directive? No - Patient declined     No Known Allergies  Chief Complaint  Patient presents with  . Discharge Note    Discharge Visit    HPI:  72 y.o. female seen today for discharge from facility tomorrow.  Patient was here for strengthening and rehab after hospitalization for cellulitis with candidiasis- and significant weakness  She also has a history of COPD as well as tobacco abuse chronic leukocytosis followed by hematology in addition to psoriasis.  She came to the ER with weakness fatigue and recurrent falls there are also concerns by family of skin hygiene was at home.  She was found to have significant cellulitis and candidiasis in her skin folds and was started on IV antibiotics and antifungal agents she did complete her Diflucan and antibiotics-and continues on nystatin.  These appears to have improved significantly she still has the rashes and erythema but this appears to be less beefy red less inflamed she says she feels significantly better.  She does have a history of  coexistent psoriasis as well she has had this long-term she is on clobetasol we also did start her on a short course of prednisone since apparently she responded well to this in the past when it was prescribed by her primary care provider.  White count today is 21,000 and has again been consistently elevated she is not showing any signs of infection denies fever chills dysuria any increased cough or congestion or diarrhea- I suspect the prednisone is contributing to the consistently elevated white count as well.  Her other medical conditions including COPD and hypertension and anxiety appears to be stable she does continue on Xanax as needed for anxiety and is on Celexa for depression.  She continues to be in good spirits is looking very much forward to going home- apparently she will be in an independent apartment but will need follow-up by PT OT as well as home health.    .      Past Medical History:  Diagnosis Date  . Anxiety   . Arthritis   . Hyperlipidemia   . Hypertension   . Psoriasis     Past Surgical History:  Procedure Laterality Date  . ABDOMINAL HYSTERECTOMY    . CHOLECYSTECTOMY    . HERNIA REPAIR    . KNEE SURGERY    . TUBAL LIGATION        reports that she has been smoking. She has never used smokeless tobacco. She reports that she does not drink alcohol or use drugs. Social History   Socioeconomic History  . Marital status: Single    Spouse name: Not  on file  . Number of children: Not on file  . Years of education: Not on file  . Highest education level: Not on file  Occupational History  . Not on file  Social Needs  . Financial resource strain: Not on file  . Food insecurity:    Worry: Not on file    Inability: Not on file  . Transportation needs:    Medical: Not on file    Non-medical: Not on file  Tobacco Use  . Smoking status: Current Every Day Smoker  . Smokeless tobacco: Never Used  Substance and Sexual Activity  . Alcohol use: No     Frequency: Never  . Drug use: No  . Sexual activity: Not on file  Lifestyle  . Physical activity:    Days per week: Not on file    Minutes per session: Not on file  . Stress: Not on file  Relationships  . Social connections:    Talks on phone: Not on file    Gets together: Not on file    Attends religious service: Not on file    Active member of club or organization: Not on file    Attends meetings of clubs or organizations: Not on file    Relationship status: Not on file  . Intimate partner violence:    Fear of current or ex partner: Not on file    Emotionally abused: Not on file    Physically abused: Not on file    Forced sexual activity: Not on file  Other Topics Concern  . Not on file  Social History Narrative  . Not on file   Functional Status Survey:    No Known Allergies  Pertinent  Health Maintenance Due  Topic Date Due  . MAMMOGRAM  05/03/2018 (Originally 02/16/1995)  . DEXA SCAN  05/03/2018 (Originally 02/15/2010)  . COLONOSCOPY  05/03/2018 (Originally 02/16/1995)  . PNA vac Low Risk Adult (1 of 2 - PCV13) 05/03/2018 (Originally 02/15/2010)  . INFLUENZA VACCINE  Completed    Medications: Outpatient Encounter Medications as of 04/16/2018  Medication Sig  . ALPRAZolam (XANAX) 0.5 MG tablet Take 0.5 mg by mouth every 12 (twelve) hours as needed for anxiety.  . Cholecalciferol (VITAMIN D3) 1000 units CAPS Take by mouth.  . citalopram (CELEXA) 20 MG tablet Take 20 mg by mouth daily. For depression  . Clobetasol Prop Emollient Base (CLOBETASOL PROPIONATE E) 0.05 % emollient cream Apply 1 application topically 2 (two) times daily. Apply to ECZEMA areas  . Diclofenac Sodium 3 % GEL Place 4 g onto the skin 4 (four) times daily as needed.  . nystatin (MYCOSTATIN/NYSTOP) powder Apply topically 3 (three) times daily for 14 days.  . Omega-3 Fatty Acids (FISH OIL) 1000 MG CAPS Take by mouth.  . predniSONE (DELTASONE) 5 MG tablet Take 5 mg by mouth daily with breakfast. From  04/15/2018-04/19/2018  . Probiotic Product (RISA-BID PROBIOTIC) TABS Take 1 tablet by mouth twice a day  . rosuvastatin (CRESTOR) 20 MG tablet Take 1 tablet (20 mg total) by mouth daily.  . traMADol (ULTRAM) 50 MG tablet Take 2 tablets (100 mg total) by mouth every 6 (six) hours as needed for moderate pain. For pain  . [DISCONTINUED] predniSONE (DELTASONE) 10 MG tablet Take 10 mg by mouth daily with breakfast. From 04/10/2018-04/14/2018   No facility-administered encounter medications on file as of 04/16/2018.      Review of Systems   In general she is not complaining of any fever chills  Skin  does have significant issues as noted above but this appears to be resolving with the cellulitis and candidiasis.  Head ears eyes nose mouth and throat is not complaining of visual changes or sore throat.  Respiratory does not complain of shortness of breath or cough.  Cardiac not complaining of chest pain has chronic lower extremity edema appears to be baseline.  GI is not complaining of abdominal pain nausea vomiting diarrhea constipation appears to have a good appetite.  GU denies dysuria.  Musculoskeletal has weakness but has gained strength during her stay here with therapy does not complain of joint pain currently.  Neurologic does not complain of dizziness headache or syncope.  And psych does have history of anxiety and depression but this is been well controlled during her stay here has not really been an issue.      --She is afebrile pulse is 70 respirations of 18 blood pressure taken manually 130/80- weight is 190.6 pounds.  In general this is a pleasant elderly female in no distress sitting comfortably in her wheelchair.  Her skin is warm and dry he is to have rashes under her breasts bilaterally and in the perineal inguinal areas inner thighs-however this appears to be improved with no longer a beefy red appearance appears to be getting somewhat smaller in size she says she feels  more comfortable.    Eyes visual acuity appears to be intact sclera and conjunctive are clear.  Oropharynx is clear mucous membranes moist.  Chest is clear to auscultation- she had minimal wheezing but when cough this cleared.  Heart is regular rate and rhythm without murmur gallop or rub she has moderate lower extremity edema bilaterally this appears baseline.    Musculoskeletal is able to stand without assistance appears to have gotten stronger moves all extremities x4.  Neurologic is grossly intact her speech is clear no lateralizing findings.  Psych she is alert and oriented very pleasant and appropriate Physical Exam  Labs reviewed: Basic Metabolic Panel: Recent Labs    03/29/18 1934 03/30/18 0504  04/06/18 0719 04/13/18 0730 04/16/18 0700  NA  --  139   < > 139 143 140  K  --  3.6   < > 3.8 4.2 4.2  CL  --  96*   < > 98 106 104  CO2  --  35*   < > 32 29 29  GLUCOSE  --  93   < > 81 84 92  BUN  --  17   < > 17 21 26*  CREATININE  --  1.18*   < > 1.03* 0.88 0.89  CALCIUM  --  8.5*   < > 9.0 9.3 9.4  MG 1.2* 1.7  --   --   --   --    < > = values in this interval not displayed.   Liver Function Tests: Recent Labs    04/28/17 1111 03/29/18 1540  AST 16 19  ALT 12 10  ALKPHOS 64 48  BILITOT 0.3 1.1  PROT 6.8 7.1  ALBUMIN 4.1 3.0*   No results for input(s): LIPASE, AMYLASE in the last 8760 hours. No results for input(s): AMMONIA in the last 8760 hours. CBC: Recent Labs    04/04/18 0415 04/13/18 0730 04/16/18 0700  WBC 13.9* 19.3* 21.4*  NEUTROABS 9.3* 10.8* 14.1*  HGB 11.2* 12.7 12.5  HCT 36.3 42.8 40.7  MCV 101.7* 103.6* 100.2*  PLT 324 325 306   Cardiac Enzymes: No results for input(s): CKTOTAL,  CKMB, CKMBINDEX, TROPONINI in the last 8760 hours. BNP: Invalid input(s): POCBNP CBG: No results for input(s): GLUCAP in the last 8760 hours.  Procedures and Imaging Studies During Stay: Dg Chest 2 View  Result Date: 03/29/2018 CLINICAL DATA:   Hypoxia. EXAM: CHEST - 2 VIEW COMPARISON:  None. FINDINGS: The heart size and mediastinal contours are within normal limits. Both lungs are clear. The visualized skeletal structures are unremarkable. IMPRESSION: No active cardiopulmonary disease. Electronically Signed   By: Obie Dredge M.D.   On: 03/29/2018 16:26   Dg Pelvis 1-2 Views  Result Date: 03/29/2018 CLINICAL DATA:  Bilateral hip pain. EXAM: PELVIS - 1-2 VIEW COMPARISON:  None. FINDINGS: Bones are diffusely demineralized. SI joints and symphysis pubis unremarkable. No evidence for an acute fracture. IMPRESSION: Negative. Electronically Signed   By: Kennith Center M.D.   On: 03/29/2018 16:29   Ct Head Wo Contrast  Result Date: 03/29/2018 CLINICAL DATA:  Weakness for the past 3 days. EXAM: CT HEAD WITHOUT CONTRAST TECHNIQUE: Contiguous axial images were obtained from the base of the skull through the vertex without intravenous contrast. COMPARISON:  None. FINDINGS: Brain: Mild-to-moderate enlargement of the ventricles and subarachnoid spaces. Moderate patchy white matter low density in both cerebral hemispheres. No intracranial hemorrhage, mass lesion or CT evidence of acute infarction. Vascular: No hyperdense vessel or unexpected calcification. Skull: Normal. Negative for fracture or focal lesion. Sinuses/Orbits: Unremarkable. Other: None. IMPRESSION: 1. No acute abnormality. 2. Mild to moderate diffuse cerebral and cerebellar atrophy. 3. Moderate chronic small vessel white matter ischemic changes in both cerebral hemispheres. Electronically Signed   By: Beckie Salts M.D.   On: 03/29/2018 16:40   Dg Foot Complete Right  Result Date: 03/30/2018 CLINICAL DATA:  Foot pain. EXAM: RIGHT FOOT COMPLETE - 3+ VIEW COMPARISON:  None. FINDINGS: The bones appear osteopenic. There are no acute fractures or dislocations identified. Plantar heel spur noted. Mild diffuse soft tissue edema. IMPRESSION: 1. No acute bone abnormality. 2. Mild soft tissue  edema. 3. Plantar heel spur Electronically Signed   By: Signa Kell M.D.   On: 03/30/2018 16:20    Assessment/Plan:      #1 history of weakness she has gotten stronger would benefit from continued PT and OT apparently she will be going back to her apartment but will need follow-up by therapy and I suspect home health as well she does have strong support from her daughter as well.  2.  History of cellulitis and candidiasis this was a significant issue but appears to have responded to her antibiotic which she is completed as well as Diflucan she does continue on nystatin she is also on clips nadolol for coexistent psoriasis- he is finishing a short course of prednisone as well- provider expediently.  3.  History of chronic leukocytosis white count continues to be elevated I suspect this is also somewhat elevated because she is now on prednisone for short course she does not exhibit signs of infection this will need follow-up as an outpatient.  4.  History of hypertension this appears stable she is not on any medication blood pressure manually today 130/80 I see previous reading 115/60-127/66.  5.  History of COPD this appears stable lung exam was quite benign today continue to monitor she denies shortness of breath or cough.  6.  History of anxiety she has PRN Xanax this is been stable during her stay.  7.  Depression this appears stable on Celexa continues to be in good spirits.  8.  History of hyperlipidemia she is on a statin and fish oil will have follow-up by primary care provider since her stay was short here was not aggressive pursuing a lipid panel.  Again she will be going home she will need PT and OT and I suspect home health she will be going back to her apartment she does have strong family support from her daughter-she will need expedient primary care follow-up.  [id number]--VW note greater than 30 minutes spent on this discharge summary-greater than 50% of time spent  coordinating a plan of care for numerous diagnoses

## 2018-04-21 ENCOUNTER — Other Ambulatory Visit: Payer: Self-pay

## 2018-04-21 DIAGNOSIS — M170 Bilateral primary osteoarthritis of knee: Secondary | ICD-10-CM | POA: Diagnosis not present

## 2018-04-21 DIAGNOSIS — F329 Major depressive disorder, single episode, unspecified: Secondary | ICD-10-CM | POA: Diagnosis not present

## 2018-04-21 DIAGNOSIS — L409 Psoriasis, unspecified: Secondary | ICD-10-CM | POA: Diagnosis not present

## 2018-04-21 DIAGNOSIS — G8929 Other chronic pain: Secondary | ICD-10-CM | POA: Diagnosis not present

## 2018-04-21 DIAGNOSIS — L03314 Cellulitis of groin: Secondary | ICD-10-CM | POA: Diagnosis not present

## 2018-04-21 DIAGNOSIS — J449 Chronic obstructive pulmonary disease, unspecified: Secondary | ICD-10-CM | POA: Diagnosis not present

## 2018-04-21 DIAGNOSIS — F411 Generalized anxiety disorder: Secondary | ICD-10-CM | POA: Diagnosis not present

## 2018-04-21 DIAGNOSIS — B372 Candidiasis of skin and nail: Secondary | ICD-10-CM | POA: Diagnosis not present

## 2018-04-21 DIAGNOSIS — I10 Essential (primary) hypertension: Secondary | ICD-10-CM | POA: Diagnosis not present

## 2018-04-21 NOTE — Patient Outreach (Signed)
[facility] ([facility] Care Management  04/21/2018  Denise Underwood 01/03/1946 [id number]  Transition of care  Referral date: 04/21/18 Referral source: [facility]/17/18 Insurance: [employer]  Telephone call to patient regarding transition of care referral.  HIPAA verified with patient. Explained reason for call. Patient state [facility]  Patient states she was in the hospital initially for a low potassium and skin irritation and falls. Patient states she will be having home health services with [facility].  She states she had her first visit today. Patient states she has a follow up appointment with her primary care provider on 12/ 13/19.  Patient reports having her medications and taking them as prescribed. Patient states her daughter takes her to her appointments and assists with her care as needed. RNCM discussed and offered ongoing transition of care follow up.  Patient declines services at this time stating [facility] will be following up with her.  RNCM offered to [facility]/ magnet. Patient verbally agreed.   PLAN: RNCM will close patient due to refusal of services.  RNCM will send [facility] to patient. RNCM will send closure letter to patients primary MD.   George Inaavina Justis Closser RN,BSN,CCM [facility] Telephonic  [phone]

## 2018-04-22 DIAGNOSIS — L409 Psoriasis, unspecified: Secondary | ICD-10-CM | POA: Diagnosis not present

## 2018-04-22 DIAGNOSIS — F411 Generalized anxiety disorder: Secondary | ICD-10-CM | POA: Diagnosis not present

## 2018-04-22 DIAGNOSIS — B372 Candidiasis of skin and nail: Secondary | ICD-10-CM | POA: Diagnosis not present

## 2018-04-22 DIAGNOSIS — F329 Major depressive disorder, single episode, unspecified: Secondary | ICD-10-CM | POA: Diagnosis not present

## 2018-04-22 DIAGNOSIS — G8929 Other chronic pain: Secondary | ICD-10-CM | POA: Diagnosis not present

## 2018-04-22 DIAGNOSIS — J449 Chronic obstructive pulmonary disease, unspecified: Secondary | ICD-10-CM | POA: Diagnosis not present

## 2018-04-22 DIAGNOSIS — M170 Bilateral primary osteoarthritis of knee: Secondary | ICD-10-CM | POA: Diagnosis not present

## 2018-04-22 DIAGNOSIS — I10 Essential (primary) hypertension: Secondary | ICD-10-CM | POA: Diagnosis not present

## 2018-04-22 DIAGNOSIS — L03314 Cellulitis of groin: Secondary | ICD-10-CM | POA: Diagnosis not present

## 2018-04-24 ENCOUNTER — Ambulatory Visit (INDEPENDENT_AMBULATORY_CARE_PROVIDER_SITE_OTHER): Payer: Medicare HMO | Admitting: Physician Assistant

## 2018-04-24 ENCOUNTER — Encounter: Payer: Self-pay | Admitting: Physician Assistant

## 2018-04-24 VITALS — BP 99/75 | HR 102 | Temp 97.8°F | Ht 64.0 in | Wt 188.4 lb

## 2018-04-24 DIAGNOSIS — M170 Bilateral primary osteoarthritis of knee: Secondary | ICD-10-CM | POA: Diagnosis not present

## 2018-04-24 DIAGNOSIS — B354 Tinea corporis: Secondary | ICD-10-CM

## 2018-04-24 DIAGNOSIS — I10 Essential (primary) hypertension: Secondary | ICD-10-CM | POA: Diagnosis not present

## 2018-04-24 MED ORDER — HYDROCHLOROTHIAZIDE 25 MG PO TABS: 25.0000 mg | ORAL_TABLET | Freq: Every day | ORAL | 0 refills | Status: DC

## 2018-04-24 MED ORDER — NYSTATIN 100000 UNIT/GM EX POWD: Freq: Four times a day (QID) | CUTANEOUS | 11 refills | Status: DC

## 2018-04-24 NOTE — Progress Notes (Signed)
BP 99/75   Pulse (!) 102   Temp 97.8 F (36.6 C) (Oral)   Ht 5\' 4"  (1.626 m)   Wt 188 lb 6.4 oz (85.5 kg)   BMI 32.34 kg/m    Subjective:    Patient ID: Denise Underwood, female    DOB: September 03, 1945, 72 y.o.   MRN: [id number]  HPI: Denise Underwood is a 72 y.o. female presenting on 04/24/2018 for Hospitalization Follow-up [facility] ) This patient comes in as a hospital follow-up.  She was admitted to [facility] for several falls that were related to infection and hypokalemia.  Since then she has had very good readings and followings.  She did go to [facility] for rehabilitation for couple weeks.  She had a great result from this.  She is doing very well and currently her chronic tinea corporis is very well controlled.  She has baseline psoriasis that keeps her skin open and then she tends to get these infections under her breast and under her abdominal fold.  Everything seems to be fairly dry at this time.  She has been able to quit smoking over the past 9 weeks.   Past Medical History:  Diagnosis Date  . Anxiety   . Arthritis   . Hyperlipidemia   . Hypertension   . Psoriasis    Relevant past medical, surgical, family and social history reviewed and updated as indicated. Interim medical history since our last visit reviewed. Allergies and medications reviewed and updated. DATA REVIEWED: CHART IN EPIC  Family History reviewed for pertinent findings.  Review of Systems  Constitutional: Negative.  Negative for activity change, fatigue and fever.  HENT: Negative.   Eyes: Negative.   Respiratory: Negative.  Negative for cough.   Cardiovascular: Negative.  Negative for chest pain.  Gastrointestinal: Negative.  Negative for abdominal pain.  Endocrine: Negative.   Genitourinary: Negative.  Negative for dysuria.  Musculoskeletal: Positive for arthralgias and gait problem.  Skin: Negative.   Neurological: Positive for weakness.    Allergies as of 04/24/2018   No Known  Allergies     Medication List       Accurate as of April 24, 2018  5:30 PM. Always use your most recent med list.        ALPRAZolam 0.5 MG tablet Commonly known as:  XANAX Take 0.5 mg by mouth every 12 (twelve) hours as needed for anxiety.   citalopram 20 MG tablet Commonly known as:  CELEXA Take 20 mg by mouth daily. For depression   Clobetasol Prop Emollient Base 0.05 % emollient cream Commonly known as:  CLOBETASOL PROPIONATE E Apply 1 application topically 2 (two) times daily. Apply to ECZEMA areas   Diclofenac Sodium 3 % Gel Place 4 g onto the skin 4 (four) times daily as needed.   Fish Oil 1000 MG Caps Take by mouth.   hydrochlorothiazide 25 MG tablet Commonly known as:  HYDRODIURIL Take 1 tablet (25 mg total) by mouth daily. As needed for fluid   nystatin powder Commonly known as:  MYCOSTATIN/NYSTOP Apply topically 4 (four) times daily.   RISA-BID PROBIOTIC Tabs Take 1 tablet by mouth twice a day   rosuvastatin 20 MG tablet Commonly known as:  CRESTOR Take 1 tablet (20 mg total) by mouth daily.   traMADol 50 MG tablet Commonly known as:  ULTRAM Take 2 tablets (100 mg total) by mouth every 6 (six) hours as needed for moderate pain. For pain   Vitamin D3  25 MCG (1000 UT) Caps Take by mouth.          Objective:    BP 99/75   Pulse (!) 102   Temp 97.8 F (36.6 C) (Oral)   Ht 5\' 4"  (1.626 m)   Wt 188 lb 6.4 oz (85.5 kg)   BMI 32.34 kg/m   No Known Allergies  Wt Readings from Last 3 Encounters:  04/24/18 188 lb 6.4 oz (85.5 kg)  04/03/18 191 lb 5.7 oz (86.8 kg)  02/17/18 181 lb 6.4 oz (82.3 kg)    Physical Exam Constitutional:      Appearance: She is well-developed.  HENT:     Head: Normocephalic and atraumatic.  Eyes:     Conjunctiva/sclera: Conjunctivae normal.     Pupils: Pupils are equal, round, and reactive to light.  Cardiovascular:     Rate and Rhythm: Normal rate and regular rhythm.     Heart sounds: Normal heart sounds.    Pulmonary:     Effort: Pulmonary effort is normal.     Breath sounds: Normal breath sounds.  Abdominal:     General: Bowel sounds are normal.     Palpations: Abdomen is soft.  Skin:    General: Skin is warm and dry.     Findings: Erythema and rash present. Rash is macular.       Neurological:     Mental Status: She is alert and oriented to person, place, and time.     Deep Tendon Reflexes: Reflexes are normal and symmetric.  Psychiatric:        Behavior: Behavior normal.        Thought Content: Thought content normal.        Judgment: Judgment normal.     Results for orders placed or performed during the hospital encounter of 04/16/18  CBC with Differential/Platelet  Result Value Ref Range   WBC 21.4 (H) 4.0 - 10.5 K/uL   RBC 4.06 3.87 - 5.11 MIL/uL   Hemoglobin 12.5 12.0 - 15.0 g/dL   HCT 16.1 09.6 - 04.5 %   MCV 100.2 (H) 80.0 - 100.0 fL   MCH 30.8 26.0 - 34.0 pg   MCHC 30.7 30.0 - 36.0 g/dL   RDW 40.9 81.1 - 91.4 %   Platelets 306 150 - 400 K/uL   nRBC 0.0 0.0 - 0.2 %   Neutrophils Relative % 65 %   Neutro Abs 14.1 (H) 1.7 - 7.7 K/uL   Lymphocytes Relative 24 %   Lymphs Abs 5.1 (H) 0.7 - 4.0 K/uL   Monocytes Relative 5 %   Monocytes Absolute 1.1 (H) 0.1 - 1.0 K/uL   Eosinophils Relative 4 %   Eosinophils Absolute 0.9 (H) 0.0 - 0.5 K/uL   Basophils Relative 1 %   Basophils Absolute 0.1 0.0 - 0.1 K/uL   Immature Granulocytes 1 %   Abs Immature Granulocytes 0.13 (H) 0.00 - 0.07 K/uL  Basic metabolic panel  Result Value Ref Range   Sodium 140 135 - 145 mmol/L   Potassium 4.2 3.5 - 5.1 mmol/L   Chloride 104 98 - 111 mmol/L   CO2 29 22 - 32 mmol/L   Glucose, Bld 92 70 - 99 mg/dL   BUN 26 (H) 8 - 23 mg/dL   Creatinine, Ser 7.82 0.44 - 1.00 mg/dL   Calcium 9.4 8.9 - 95.6 mg/dL   GFR calc non Af Amer >60 >60 mL/min   GFR calc Af Amer >60 >60 mL/min   Anion gap 7  5 - 15      Assessment & Plan:   1. Tinea corporis Continue nystatin powders Call if any  recurrence of infection  2. Essential hypertension This problem has resolved now.  She is not on any hypertension medication.  3. Primary osteoarthritis of both knees Continue medications   Continue all other maintenance medications as listed above.  Follow up plan: Return in about 3 months (around 07/24/2018).  Educational handout given for survey  Remus LofflerAngel S. Faraaz Wolin PA-C [facility] [address]  [place], [place] [id number] [phone]   04/24/2018, 5:30 PM

## 2018-04-29 DIAGNOSIS — B372 Candidiasis of skin and nail: Secondary | ICD-10-CM | POA: Diagnosis not present

## 2018-04-29 DIAGNOSIS — F329 Major depressive disorder, single episode, unspecified: Secondary | ICD-10-CM | POA: Diagnosis not present

## 2018-04-29 DIAGNOSIS — I10 Essential (primary) hypertension: Secondary | ICD-10-CM | POA: Diagnosis not present

## 2018-04-29 DIAGNOSIS — M170 Bilateral primary osteoarthritis of knee: Secondary | ICD-10-CM | POA: Diagnosis not present

## 2018-04-29 DIAGNOSIS — F411 Generalized anxiety disorder: Secondary | ICD-10-CM | POA: Diagnosis not present

## 2018-04-29 DIAGNOSIS — L409 Psoriasis, unspecified: Secondary | ICD-10-CM | POA: Diagnosis not present

## 2018-04-29 DIAGNOSIS — G8929 Other chronic pain: Secondary | ICD-10-CM | POA: Diagnosis not present

## 2018-04-29 DIAGNOSIS — J449 Chronic obstructive pulmonary disease, unspecified: Secondary | ICD-10-CM | POA: Diagnosis not present

## 2018-04-29 DIAGNOSIS — L03314 Cellulitis of groin: Secondary | ICD-10-CM | POA: Diagnosis not present

## 2018-05-01 DIAGNOSIS — L409 Psoriasis, unspecified: Secondary | ICD-10-CM | POA: Diagnosis not present

## 2018-05-01 DIAGNOSIS — B372 Candidiasis of skin and nail: Secondary | ICD-10-CM | POA: Diagnosis not present

## 2018-05-01 DIAGNOSIS — G8929 Other chronic pain: Secondary | ICD-10-CM | POA: Diagnosis not present

## 2018-05-01 DIAGNOSIS — F329 Major depressive disorder, single episode, unspecified: Secondary | ICD-10-CM | POA: Diagnosis not present

## 2018-05-01 DIAGNOSIS — F411 Generalized anxiety disorder: Secondary | ICD-10-CM | POA: Diagnosis not present

## 2018-05-01 DIAGNOSIS — J449 Chronic obstructive pulmonary disease, unspecified: Secondary | ICD-10-CM | POA: Diagnosis not present

## 2018-05-01 DIAGNOSIS — M170 Bilateral primary osteoarthritis of knee: Secondary | ICD-10-CM | POA: Diagnosis not present

## 2018-05-01 DIAGNOSIS — I10 Essential (primary) hypertension: Secondary | ICD-10-CM | POA: Diagnosis not present

## 2018-05-01 DIAGNOSIS — L03314 Cellulitis of groin: Secondary | ICD-10-CM | POA: Diagnosis not present

## 2018-05-04 ENCOUNTER — Telehealth: Payer: Self-pay | Admitting: Family Medicine

## 2018-05-04 DIAGNOSIS — M170 Bilateral primary osteoarthritis of knee: Secondary | ICD-10-CM | POA: Diagnosis not present

## 2018-05-04 DIAGNOSIS — L03314 Cellulitis of groin: Secondary | ICD-10-CM | POA: Diagnosis not present

## 2018-05-04 DIAGNOSIS — F411 Generalized anxiety disorder: Secondary | ICD-10-CM | POA: Diagnosis not present

## 2018-05-04 DIAGNOSIS — B372 Candidiasis of skin and nail: Secondary | ICD-10-CM | POA: Diagnosis not present

## 2018-05-04 DIAGNOSIS — L409 Psoriasis, unspecified: Secondary | ICD-10-CM | POA: Diagnosis not present

## 2018-05-04 DIAGNOSIS — G8929 Other chronic pain: Secondary | ICD-10-CM | POA: Diagnosis not present

## 2018-05-04 DIAGNOSIS — I10 Essential (primary) hypertension: Secondary | ICD-10-CM | POA: Diagnosis not present

## 2018-05-04 DIAGNOSIS — J449 Chronic obstructive pulmonary disease, unspecified: Secondary | ICD-10-CM | POA: Diagnosis not present

## 2018-05-04 DIAGNOSIS — F329 Major depressive disorder, single episode, unspecified: Secondary | ICD-10-CM | POA: Diagnosis not present

## 2018-05-04 NOTE — Telephone Encounter (Signed)
FYI

## 2018-05-06 DIAGNOSIS — R5381 Other malaise: Secondary | ICD-10-CM | POA: Diagnosis not present

## 2018-05-06 DIAGNOSIS — W19XXXA Unspecified fall, initial encounter: Secondary | ICD-10-CM | POA: Diagnosis not present

## 2018-05-07 DIAGNOSIS — L03314 Cellulitis of groin: Secondary | ICD-10-CM | POA: Diagnosis not present

## 2018-05-07 DIAGNOSIS — F411 Generalized anxiety disorder: Secondary | ICD-10-CM | POA: Diagnosis not present

## 2018-05-07 DIAGNOSIS — F329 Major depressive disorder, single episode, unspecified: Secondary | ICD-10-CM | POA: Diagnosis not present

## 2018-05-07 DIAGNOSIS — I10 Essential (primary) hypertension: Secondary | ICD-10-CM | POA: Diagnosis not present

## 2018-05-07 DIAGNOSIS — L409 Psoriasis, unspecified: Secondary | ICD-10-CM | POA: Diagnosis not present

## 2018-05-07 DIAGNOSIS — G8929 Other chronic pain: Secondary | ICD-10-CM | POA: Diagnosis not present

## 2018-05-07 DIAGNOSIS — J449 Chronic obstructive pulmonary disease, unspecified: Secondary | ICD-10-CM | POA: Diagnosis not present

## 2018-05-07 DIAGNOSIS — M170 Bilateral primary osteoarthritis of knee: Secondary | ICD-10-CM | POA: Diagnosis not present

## 2018-05-07 DIAGNOSIS — B372 Candidiasis of skin and nail: Secondary | ICD-10-CM | POA: Diagnosis not present

## 2018-05-08 ENCOUNTER — Telehealth: Payer: Self-pay | Admitting: *Deleted

## 2018-05-08 ENCOUNTER — Ambulatory Visit (INDEPENDENT_AMBULATORY_CARE_PROVIDER_SITE_OTHER): Payer: Medicare HMO | Admitting: Pediatrics

## 2018-05-08 ENCOUNTER — Other Ambulatory Visit: Payer: Self-pay

## 2018-05-08 ENCOUNTER — Emergency Department (HOSPITAL_COMMUNITY)
Admission: EM | Admit: 2018-05-08 | Discharge: 2018-05-08 | Disposition: A | Discharge status: Home / Self Care | Payer: Medicare HMO | Attending: Emergency Medicine | Admitting: Emergency Medicine

## 2018-05-08 ENCOUNTER — Encounter: Payer: Self-pay | Admitting: Pediatrics

## 2018-05-08 ENCOUNTER — Encounter (HOSPITAL_COMMUNITY): Payer: Self-pay

## 2018-05-08 VITALS — BP 139/88 | HR 147 | Temp 98.9°F | Resp 20 | Ht 64.0 in | Wt 193.0 lb

## 2018-05-08 DIAGNOSIS — L03115 Cellulitis of right lower limb: Secondary | ICD-10-CM | POA: Diagnosis not present

## 2018-05-08 DIAGNOSIS — R197 Diarrhea, unspecified: Secondary | ICD-10-CM

## 2018-05-08 DIAGNOSIS — Z87891 Personal history of nicotine dependence: Secondary | ICD-10-CM | POA: Insufficient documentation

## 2018-05-08 DIAGNOSIS — I10 Essential (primary) hypertension: Secondary | ICD-10-CM | POA: Insufficient documentation

## 2018-05-08 DIAGNOSIS — I480 Paroxysmal atrial fibrillation: Secondary | ICD-10-CM

## 2018-05-08 DIAGNOSIS — G8929 Other chronic pain: Secondary | ICD-10-CM | POA: Diagnosis not present

## 2018-05-08 DIAGNOSIS — R000 Tachycardia, unspecified: Secondary | ICD-10-CM

## 2018-05-08 DIAGNOSIS — Z79899 Other long term (current) drug therapy: Secondary | ICD-10-CM | POA: Insufficient documentation

## 2018-05-08 DIAGNOSIS — I4891 Unspecified atrial fibrillation: Secondary | ICD-10-CM

## 2018-05-08 DIAGNOSIS — B372 Candidiasis of skin and nail: Secondary | ICD-10-CM | POA: Diagnosis not present

## 2018-05-08 DIAGNOSIS — L409 Psoriasis, unspecified: Secondary | ICD-10-CM | POA: Diagnosis not present

## 2018-05-08 DIAGNOSIS — J449 Chronic obstructive pulmonary disease, unspecified: Secondary | ICD-10-CM | POA: Diagnosis not present

## 2018-05-08 DIAGNOSIS — F329 Major depressive disorder, single episode, unspecified: Secondary | ICD-10-CM | POA: Diagnosis not present

## 2018-05-08 DIAGNOSIS — M170 Bilateral primary osteoarthritis of knee: Secondary | ICD-10-CM | POA: Diagnosis not present

## 2018-05-08 DIAGNOSIS — F411 Generalized anxiety disorder: Secondary | ICD-10-CM | POA: Diagnosis not present

## 2018-05-08 DIAGNOSIS — L03314 Cellulitis of groin: Secondary | ICD-10-CM | POA: Diagnosis not present

## 2018-05-08 LAB — BASIC METABOLIC PANEL
Anion gap: 8 (ref 5–15)
BUN: 14 mg/dL (ref 8–23)
CO2: 28 mmol/L (ref 22–32)
Calcium: 8.8 mg/dL — ABNORMAL LOW (ref 8.9–10.3)
Chloride: 104 mmol/L (ref 98–111)
Creatinine, Ser: 0.98 mg/dL (ref 0.44–1.00)
GFR, EST NON AFRICAN AMERICAN: 57 mL/min — AB (ref >60)
Glucose, Bld: 100 mg/dL — ABNORMAL HIGH (ref 70–99)
Potassium: 3.7 mmol/L (ref 3.5–5.1)
Sodium: 140 mmol/L (ref 135–145)

## 2018-05-08 LAB — CBC WITH DIFFERENTIAL/PLATELET
Abs Immature Granulocytes: 0.08 10*3/uL — ABNORMAL HIGH (ref 0.00–0.07)
BASOS ABS: 0 10*3/uL (ref 0.0–0.1)
Basophils Relative: 0 %
Eosinophils Absolute: 0.4 10*3/uL (ref 0.0–0.5)
Eosinophils Relative: 2 %
HCT: 39.5 % (ref 36.0–46.0)
Hemoglobin: 12 g/dL (ref 12.0–15.0)
Immature Granulocytes: 1 %
Lymphocytes Relative: 16 %
Lymphs Abs: 2.7 10*3/uL (ref 0.7–4.0)
MCH: 30.4 pg (ref 26.0–34.0)
MCHC: 30.4 g/dL (ref 30.0–36.0)
MCV: 100 fL (ref 80.0–100.0)
Monocytes Absolute: 1.2 10*3/uL — ABNORMAL HIGH (ref 0.1–1.0)
Monocytes Relative: 7 %
Neutro Abs: 13 10*3/uL — ABNORMAL HIGH (ref 1.7–7.7)
Neutrophils Relative %: 74 %
Platelets: 183 10*3/uL (ref 150–400)
RBC: 3.95 MIL/uL (ref 3.87–5.11)
RDW: 14.4 % (ref 11.5–15.5)
WBC: 17.4 10*3/uL — AB (ref 4.0–10.5)
nRBC: 0 % (ref 0.0–0.2)

## 2018-05-08 LAB — TROPONIN I: Troponin I: <0.03 ng/mL (ref <0.03)

## 2018-05-08 MED ORDER — CLINDAMYCIN HCL 300 MG PO CAPS: 300.0000 mg | ORAL_CAPSULE | Freq: Four times a day (QID) | ORAL | 0 refills | Status: DC

## 2018-05-08 MED ORDER — TRAMADOL HCL 50 MG PO TABS
50.0000 mg | ORAL_TABLET | Freq: Once | ORAL | Status: AC
  Administered 2018-05-08: 50 mg via ORAL
  Filled 2018-05-08: qty 1

## 2018-05-08 MED ORDER — SODIUM CHLORIDE 0.9 % IV BOLUS
500.0000 mL | Freq: Once | INTRAVENOUS | Status: AC
  Administered 2018-05-08: 500 mL via INTRAVENOUS

## 2018-05-08 MED ORDER — CLINDAMYCIN HCL 150 MG PO CAPS
300.0000 mg | ORAL_CAPSULE | Freq: Once | ORAL | Status: AC
  Administered 2018-05-08: 300 mg via ORAL
  Filled 2018-05-08: qty 2

## 2018-05-08 MED ORDER — SODIUM CHLORIDE 0.9 % IV SOLN: INTRAVENOUS | Status: DC

## 2018-05-08 NOTE — Discharge Instructions (Addendum)
It is important to elevate your right leg above your heart as much as possible.  Use a warm moist compress on it 3 or 4 times a day for 30 to 45 minutes.  Also make sure that you are washing it at least once a day with soap and water.  You can either leave it open or covered whenever you are most comfortable with.  Your atrial fibrillation occurred, then resolved on its own today.  Because you had that, you should talk to your doctor about further treatment to prevent complications.  Your doctor also may want to do an echocardiogram to assess your heart.  Return here as needed, for problems.

## 2018-05-08 NOTE — Telephone Encounter (Signed)
If HH can recheck vitals and HR is better, that's fine. With new HR in 130s I am worried about sepsis. She can be add-on appt in my schedule at 145p. Otherwise yes, hospital or urgent care, she needs evaluation today.

## 2018-05-08 NOTE — ED Provider Notes (Signed)
[facility] EMERGENCY DEPARTMENT Provider Note   CSN: [id number] Arrival date & time: 05/08/18  1627     History   Chief Complaint Chief Complaint  Patient presents with  . Cellulitis    HPI Denise Underwood is a 72 y.o. female.  HPI    She presents for evaluation from her primary care office where she was found to have an elevated heart rate, and suspected atrial fibrillation.  She had gone there today on the advice of her home health therapist, to be evaluated for a wound on her right lower leg, and tachycardia.  She injured her right lower leg when she fell, 2 days ago.  She did not seek care because she did not think the wound was that bad.  She reports that the fall was when she misstepped and injured her right lower leg.  At the PCP office she was found to be in rapid atrial fibrillation, EKG presenting with the patient shows A. fib at 138 bpm.  She did not receive specific treatment and came here for evaluation by private vehicle.  She denies headache, shortness of breath, chest pain, weakness or dizziness.  She has had some diarrhea the last couple of days, loose, brown in color, numerous times.  She denies nausea.  She denies anorexia.  Is hungry, currently.  No history of atrial fibrillation.  There are no other known modifying factors.  Past Medical History:  Diagnosis Date  . Anxiety   . Arthritis   . Hyperlipidemia   . Hypertension   . Psoriasis     Patient Active Problem List   Diagnosis Date Noted  . Groin/inguinal and infra-mammary Area Candida intertrigo with superimposed cellulitis 03/29/2018  . Candidiasis of skin/Groin/inguinal and infra-mammary Area Candida intertrigo with superimposed cellulitis 03/29/2018  . Falls 03/29/2018  . Hypokalemia 03/29/2018  . Tinea corporis 11/19/2017  . Primary osteoarthritis of both knees 09/05/2017  . Leukocytosis 05/26/2017  . Hypertension 04/28/2017  . Psoriasis 04/28/2017  . Hyperlipidemia 04/28/2017  . GAD  (generalized anxiety disorder) 04/28/2017    Past Surgical History:  Procedure Laterality Date  . ABDOMINAL HYSTERECTOMY    . CHOLECYSTECTOMY    . HERNIA REPAIR    . KNEE SURGERY    . TUBAL LIGATION       OB History   No obstetric history on file.      Home Medications    Prior to Admission medications   Medication Sig Start Date End Date Taking? Authorizing Provider  ALPRAZolam Prudy Feeler) 0.5 MG tablet Take 0.5 mg by mouth every 12 (twelve) hours as needed for anxiety.    [provider]  Cholecalciferol (VITAMIN D3) 1000 units CAPS Take by mouth.    [provider]  citalopram (CELEXA) 20 MG tablet Take 20 mg by mouth daily. For depression    [provider]  clindamycin (CLEOCIN) 300 MG capsule Take 1 capsule (300 mg total) by mouth 4 (four) times daily. X 7 days 05/08/18   Mancel Bale, MD  Clobetasol Prop Emollient Base (CLOBETASOL PROPIONATE E) 0.05 % emollient cream Apply 1 application topically 2 (two) times daily. Apply to ECZEMA areas 09/05/17   Remus Loffler, PA-C  Diclofenac Sodium 3 % GEL Place 4 g onto the skin 4 (four) times daily as needed. 10/17/17   Remus Loffler, PA-C  hydrochlorothiazide (HYDRODIURIL) 25 MG tablet Take 1 tablet (25 mg total) by mouth daily. As needed for fluid 04/24/18   Remus Loffler, PA-C  nystatin (MYCOSTATIN/NYSTOP) powder Apply topically 4 (four) times daily. 04/24/18   Remus LofflerJones, Angel S, PA-C  Omega-3 Fatty Acids (FISH OIL) 1000 MG CAPS Take by mouth.    [provider]  Probiotic Product (RISA-BID PROBIOTIC) TABS Take 1 tablet by mouth twice a day    [provider]  rosuvastatin (CRESTOR) 20 MG tablet Take 1 tablet (20 mg total) by mouth daily. 05/30/17   Remus LofflerJones, Angel S, PA-C  traMADol (ULTRAM) 50 MG tablet Take 2 tablets (100 mg total) by mouth every 6 (six) hours as needed for moderate pain. For pain 04/08/18   Roena MaladyLassen, Arlo C, PA-C    Family History No family history on file.  Social  History Social History   Tobacco Use  . Smoking status: Former Smoker    Types: Cigarettes  . Smokeless tobacco: Never Used  Substance Use Topics  . Alcohol use: No    Frequency: Never  . Drug use: No     Allergies   Patient has no known allergies.   Review of Systems Review of Systems  All other systems reviewed and are negative.    Physical Exam Updated Vital Signs BP (!) 170/70 (BP Location: Left Arm)   Pulse 85   Temp 98.3 F (36.8 C) (Temporal)   Resp (!) 22   Ht 5\' 4"  (1.626 m)   Wt 83.5 kg   SpO2 94%   BMI 31.58 kg/m   Physical Exam Vitals signs and nursing note reviewed.  Constitutional:      Appearance: Normal appearance. She is well-developed.     Comments: Elderly, frail  HENT:     Head: Normocephalic and atraumatic.     Right Ear: External ear normal.     Left Ear: External ear normal.  Eyes:     Conjunctiva/sclera: Conjunctivae normal.     Pupils: Pupils are equal, round, and reactive to light.  Neck:     Musculoskeletal: Normal range of motion and neck supple.     Trachea: Phonation normal.  Cardiovascular:     Rate and Rhythm: Normal rate and regular rhythm.     Heart sounds: Normal heart sounds.  Pulmonary:     Effort: Pulmonary effort is normal.     Breath sounds: Normal breath sounds.  Chest:     Chest wall: No tenderness.  Abdominal:     Palpations: Abdomen is soft.     Tenderness: There is no abdominal tenderness.  Musculoskeletal: Normal range of motion.     Comments: There is a wound of the right lower extremity, above the ankle, consistent with subacute injury, apparently a flap laceration, which appears superficial.  The wound is gaping, about 2 cm, exposing fatty tissue.  There is mild redness surrounding the wound, but no fluctuance, induration, drainage or active bleeding.  There is no proximal streaking.  She has normal active range of motion of the right leg upper and lower.  Representative image of the right lower leg is  below.  Skin:    General: Skin is warm and dry.  Neurological:     Mental Status: She is alert and oriented to person, place, and time.     Cranial Nerves: No cranial nerve deficit.     Sensory: No sensory deficit.     Motor: No abnormal muscle tone.     Coordination: Coordination normal.  Psychiatric:        Mood and Affect: Mood normal.        Behavior: Behavior normal.  Thought Content: Thought content normal.        Judgment: Judgment normal.        ED Treatments / Results  Labs (all labs ordered are listed, but only abnormal results are displayed) Labs Reviewed  BASIC METABOLIC PANEL - Abnormal; Notable for the following components:      Result Value   Glucose, Bld 100 (*)    Calcium 8.8 (*)    GFR calc non Af Amer 57 (*)    All other components within normal limits  CBC WITH DIFFERENTIAL/PLATELET - Abnormal; Notable for the following components:   WBC 17.4 (*)    Neutro Abs 13.0 (*)    Monocytes Absolute 1.2 (*)    Abs Immature Granulocytes 0.08 (*)    All other components within normal limits  TROPONIN I    EKG EKG Interpretation  Date/Time:  Friday May 08 2018 16:48:17 EST Ventricular Rate:  92 PR Interval:  130 QRS Duration: 72 QT Interval:  376 QTC Calculation: 464 R Axis:   -14 Text Interpretation:  Normal sinus rhythm Septal infarct , age undetermined Abnormal ECG since last tracing no significant change Confirmed by Mancel Bale [phone]) on 05/08/2018 5:28:50 PM  CHA2DS2/VAS Stroke Risk Points  Current as of 29 minutes ago     3 >= 2 Points: High Risk  1 - 1.99 Points: Medium Risk  0 Points: Low Risk    The patient's score has not changed in the past year.:  No Change     Details    This score determines the patient's risk of having a stroke if the  patient has atrial fibrillation.       Points Metrics  0 Has Congestive Heart Failure:  No    Current as of 29 minutes ago  0 Has Vascular Disease:  No    Current as of 29 minutes  ago  1 Has Hypertension:  Yes    Current as of 29 minutes ago  1 Age:  50    Current as of 29 minutes ago  0 Has Diabetes:  No    Current as of 29 minutes ago  0 Had Stroke:  No  Had TIA:  No  Had thromboembolism:  No    Current as of 29 minutes ago  1 Female:  Yes    Current as of 29 minutes ago           Radiology No results found.  Procedures Procedures (including critical care time)  Medications Ordered in ED Medications  0.9 %  sodium chloride infusion (has no administration in time range)  clindamycin (CLEOCIN) capsule 300 mg (300 mg Oral Given 05/08/18 1840)  sodium chloride 0.9 % bolus 500 mL (500 mLs Intravenous New Bag/Given 05/08/18 1848)  traMADol (ULTRAM) tablet 50 mg (50 mg Oral Given 05/08/18 1840)     Initial Impression / Assessment and Plan / ED Course  I have reviewed the triage vital signs and the nursing notes.  Pertinent labs & imaging results that were available during my care of the patient were reviewed by me and considered in my medical decision making (see chart for details).  Clinical Course as of May 08 2002  Fri May 08, 2018  1946 Normal  Troponin I - Once [EW]  1946 Normal except white count high, with left shift  CBC with Differential(!) [EW]  1947 Normal except calcium low, GFR slightly low  Basic metabolic panel(!) [EW]    Clinical Course User Index [EW] Effie Shy,  Mechele CollinElliott, MD     Patient Vitals for the past 24 hrs:  BP Temp Temp src Pulse Resp SpO2 Height Weight  05/08/18 1849 (!) 170/70 - - 85 (!) 22 94 % - -  05/08/18 1730 138/89 - - 73 (!) 25 (!) 87 % - -  05/08/18 1642 140/66 98.3 F (36.8 C) Temporal 94 16 97 % 5\' 4"  (1.626 m) 83.5 kg    7:49 PM Reevaluation with update and discussion. After initial assessment and treatment, an updated evaluation reveals she is comfortable.  No further complaints.  Findings discussed with patient and daughter, all questions answered. Mancel BaleElliott Jarold Macomber   Medical Decision Making: Wound right  lower leg, with mild associated cellulitis.  Wound is not currently amenable to closure.  Doubt sepsis, metabolic instability or impending vascular collapse.  Patient with transient atrial fibrillation today, spontaneously  resolved prior to arrival at the ED.  No tachycardia in the ED.  Increased [place] vascular score.  Patient will need to be considered for anticoagulation by her PCP.  No indication for the ED treatment or hospitalization at this time.  CRITICAL CARE-no Performed by: Mancel BaleElliott Dontez Hauss  Nursing Notes Reviewed/ Care Coordinated Applicable Imaging Reviewed Interpretation of Laboratory Data incorporated into ED treatment  The patient appears reasonably screened and/or stabilized for discharge and I doubt any other medical condition or other [facility] requiring further screening, evaluation, or treatment in the ED at this time prior to discharge.  Plan: Home Medications-continue current; Home Treatments-wound care with soaks and daily cleansing; return here if the recommended treatment, does not improve the symptoms; Recommended follow up-PCP checkup 2 or 3 days, and as needed.  Patient instructed to follow-up with PCP regarding elevated risk for thromboembolism.   Final Clinical Impressions(s) / ED Diagnoses   Final diagnoses:  Cellulitis of right lower extremity  Paroxysmal atrial fibrillation [facility](HCC)    ED Discharge Orders         Ordered    clindamycin (CLEOCIN) 300 MG capsule  4 times daily     05/08/18 2001           Mancel BaleWentz, Aimi Essner, MD 05/08/18 2003

## 2018-05-08 NOTE — ED Triage Notes (Signed)
Daughter reports pt fell christmas eve and hit walker causing wound to right lower lateral leg. Pt has swelling and redness with open wound. Pt was seen by therapist today and HR was elevated so they recommended to go to [facility] and was sent here due to HR 133 and EKG reading of afib. Flutter. Also reports diarrhea all day today

## 2018-05-08 NOTE — Telephone Encounter (Signed)
We don't have any appointments available for her to be seen.  We can either send her to the hospital or give new orders to home health.

## 2018-05-08 NOTE — ED Notes (Signed)
Gave patient ice water as requested for fluid challenge. Patient drinking water with no difficulty.

## 2018-05-08 NOTE — Telephone Encounter (Signed)
Pt has appt today 145 with vincent

## 2018-05-08 NOTE — Telephone Encounter (Signed)
Home Health nurse called about pt, she is Denise Underwood pt. She fell three days ago and got a pretty bad skin tear to ankle. The nurse saw it yesterday and today and notices a huge difference in redness warmth and oozing. Temp is 99.6 pulse is 130. Please advise, thank you

## 2018-05-08 NOTE — Progress Notes (Signed)
  Subjective:   Patient ID: Denise Underwood, female    DOB: 09/27/1945, 72 y.o.   MRN: [id number] CC: Tachycardia; Fall; and Diarrhea  HPI: Denise FullerFrances S Underwood is a 72 y.o. female   Home health called this morning, she had a heart rate of 130.  It was rechecked by an aide who lives nearby, was up to 145.  She says she has been feeling well.  2 days ago she fell in the night and scraped her right outside the leg when getting up to make it to the bathroom.  Is more red and tender today.  Has been draining a fair amount.  She was recently hospitalized for cellulitis on the other side of her leg which has completely healed.  Started diarrhea 3 days ago.  Is going to the bathroom multiple times an hour for the last 2 to 3 days.  No abdominal pain.  No nausea or vomiting.  Stool is regular brown-colored.  Going through about 24 depends in a day per daughter.  Patient says she has been feeling okay.  No heart palpitations, no shortness of breath.  She is had some swelling in her lower legs which is not new.  Relevant past medical, surgical, family and social history reviewed. Allergies and medications reviewed and updated. Social History   Tobacco Use  Smoking Status Former Smoker  . Types: Cigarettes  Smokeless Tobacco Never Used   ROS: Per HPI   Objective:    BP 139/88   Pulse (!) 147   Temp 98.9 F (37.2 C) (Oral)   Resp 20   Ht 5\' 4"  (1.626 m)   Wt 193 lb (87.5 kg)   SpO2 96%   BMI 33.13 kg/m   Wt Readings from Last 3 Encounters:  05/08/18 193 lb (87.5 kg)  04/24/18 188 lb 6.4 oz (85.5 kg)  04/03/18 191 lb 5.7 oz (86.8 kg)    Gen: NAD, alert, cooperative with exam, NCAT EYES: EOMI, no conjunctival injection, or no icterus CV: Tachycardic, irregularly irregular.  Resp: CTABL, no wheezes, normal WOB Abd: +BS, soft, NTND.  Ext: 2+ pitting edema, warm Neuro: Alert and oriented Skin: Approximately 10 cm x 5 cm irregularly bordered abrasion, fat visible at the base.  Surrounding  erythema of up to 3 to 5 cm.  Tender to palpation.  Yellow discharge on bandage.   EKG: Heart rate 136, no P waves present, consistent with atrial fibrillation with RVR  Assessment & Plan:  Denise Underwood was seen today for tachycardia, fall and diarrhea.  Diagnoses and all orders for this visit:  Atrial fibrillation with rapid ventricular response [facility](HCC) Needs evaluation and treatment in the emergency room, possible admission for cellulitis.  Return precautions discussed.  Follow-up as scheduled within 1 to 2 weeks.  Discussed diagnosis and treatment with daughter and patient.  They agreed to transport her directly to the emergency room.  Tachycardia -     EKG 12-Lead  Cellulitis of right lower extremity  Diarrhea, unspecified type   Follow up plan: Return in about 2 weeks (around 05/22/2018). Rex Krasarol Vincent, MD [facility]

## 2018-05-08 NOTE — Telephone Encounter (Signed)
Needs to be seen ASAP with that HR. Is it still that high when they rechecked it? I don't see any history of atrial fibrillation.

## 2018-05-11 ENCOUNTER — Telehealth: Payer: Self-pay | Admitting: Physician Assistant

## 2018-05-11 ENCOUNTER — Telehealth: Payer: Self-pay | Admitting: *Deleted

## 2018-05-11 ENCOUNTER — Ambulatory Visit (INDEPENDENT_AMBULATORY_CARE_PROVIDER_SITE_OTHER): Payer: Medicare HMO

## 2018-05-11 DIAGNOSIS — L409 Psoriasis, unspecified: Secondary | ICD-10-CM | POA: Diagnosis not present

## 2018-05-11 DIAGNOSIS — L03314 Cellulitis of groin: Secondary | ICD-10-CM

## 2018-05-11 DIAGNOSIS — I10 Essential (primary) hypertension: Secondary | ICD-10-CM

## 2018-05-11 DIAGNOSIS — F17200 Nicotine dependence, unspecified, uncomplicated: Secondary | ICD-10-CM

## 2018-05-11 DIAGNOSIS — G8929 Other chronic pain: Secondary | ICD-10-CM | POA: Diagnosis not present

## 2018-05-11 DIAGNOSIS — M170 Bilateral primary osteoarthritis of knee: Secondary | ICD-10-CM | POA: Diagnosis not present

## 2018-05-11 DIAGNOSIS — E785 Hyperlipidemia, unspecified: Secondary | ICD-10-CM

## 2018-05-11 DIAGNOSIS — B372 Candidiasis of skin and nail: Secondary | ICD-10-CM

## 2018-05-11 DIAGNOSIS — Z9181 History of falling: Secondary | ICD-10-CM

## 2018-05-11 DIAGNOSIS — J449 Chronic obstructive pulmonary disease, unspecified: Secondary | ICD-10-CM | POA: Diagnosis not present

## 2018-05-11 DIAGNOSIS — F411 Generalized anxiety disorder: Secondary | ICD-10-CM

## 2018-05-11 DIAGNOSIS — F329 Major depressive disorder, single episode, unspecified: Secondary | ICD-10-CM

## 2018-05-11 NOTE — Telephone Encounter (Signed)
Please advise 

## 2018-05-11 NOTE — Telephone Encounter (Signed)
Left message to call after hours doctor for assistance or send to ED if blood pressure in dangerous area.

## 2018-05-11 NOTE — Telephone Encounter (Signed)
Spoke with patient's daughter.  Daughter states the HCTZ was stopped. Daughter can recheck BP in the AM. Aware this office will be closed Tuesday and wednesdat 12/31 and 01/01

## 2018-05-12 DIAGNOSIS — G8929 Other chronic pain: Secondary | ICD-10-CM | POA: Diagnosis not present

## 2018-05-12 DIAGNOSIS — L409 Psoriasis, unspecified: Secondary | ICD-10-CM | POA: Diagnosis not present

## 2018-05-12 DIAGNOSIS — J449 Chronic obstructive pulmonary disease, unspecified: Secondary | ICD-10-CM | POA: Diagnosis not present

## 2018-05-12 DIAGNOSIS — M170 Bilateral primary osteoarthritis of knee: Secondary | ICD-10-CM | POA: Diagnosis not present

## 2018-05-12 DIAGNOSIS — L03314 Cellulitis of groin: Secondary | ICD-10-CM | POA: Diagnosis not present

## 2018-05-12 DIAGNOSIS — F411 Generalized anxiety disorder: Secondary | ICD-10-CM | POA: Diagnosis not present

## 2018-05-12 DIAGNOSIS — I10 Essential (primary) hypertension: Secondary | ICD-10-CM | POA: Diagnosis not present

## 2018-05-12 DIAGNOSIS — F329 Major depressive disorder, single episode, unspecified: Secondary | ICD-10-CM | POA: Diagnosis not present

## 2018-05-12 DIAGNOSIS — B372 Candidiasis of skin and nail: Secondary | ICD-10-CM | POA: Diagnosis not present

## 2018-05-14 DIAGNOSIS — M170 Bilateral primary osteoarthritis of knee: Secondary | ICD-10-CM | POA: Diagnosis not present

## 2018-05-14 DIAGNOSIS — F411 Generalized anxiety disorder: Secondary | ICD-10-CM | POA: Diagnosis not present

## 2018-05-14 DIAGNOSIS — B372 Candidiasis of skin and nail: Secondary | ICD-10-CM | POA: Diagnosis not present

## 2018-05-14 DIAGNOSIS — F329 Major depressive disorder, single episode, unspecified: Secondary | ICD-10-CM | POA: Diagnosis not present

## 2018-05-14 DIAGNOSIS — J449 Chronic obstructive pulmonary disease, unspecified: Secondary | ICD-10-CM | POA: Diagnosis not present

## 2018-05-14 DIAGNOSIS — I10 Essential (primary) hypertension: Secondary | ICD-10-CM | POA: Diagnosis not present

## 2018-05-14 DIAGNOSIS — L03314 Cellulitis of groin: Secondary | ICD-10-CM | POA: Diagnosis not present

## 2018-05-14 DIAGNOSIS — L409 Psoriasis, unspecified: Secondary | ICD-10-CM | POA: Diagnosis not present

## 2018-05-14 DIAGNOSIS — G8929 Other chronic pain: Secondary | ICD-10-CM | POA: Diagnosis not present

## 2018-05-15 DIAGNOSIS — F411 Generalized anxiety disorder: Secondary | ICD-10-CM | POA: Diagnosis not present

## 2018-05-15 DIAGNOSIS — M170 Bilateral primary osteoarthritis of knee: Secondary | ICD-10-CM | POA: Diagnosis not present

## 2018-05-15 DIAGNOSIS — B372 Candidiasis of skin and nail: Secondary | ICD-10-CM | POA: Diagnosis not present

## 2018-05-15 DIAGNOSIS — G8929 Other chronic pain: Secondary | ICD-10-CM | POA: Diagnosis not present

## 2018-05-15 DIAGNOSIS — I10 Essential (primary) hypertension: Secondary | ICD-10-CM | POA: Diagnosis not present

## 2018-05-15 DIAGNOSIS — F329 Major depressive disorder, single episode, unspecified: Secondary | ICD-10-CM | POA: Diagnosis not present

## 2018-05-15 DIAGNOSIS — J449 Chronic obstructive pulmonary disease, unspecified: Secondary | ICD-10-CM | POA: Diagnosis not present

## 2018-05-15 DIAGNOSIS — L409 Psoriasis, unspecified: Secondary | ICD-10-CM | POA: Diagnosis not present

## 2018-05-15 DIAGNOSIS — L03314 Cellulitis of groin: Secondary | ICD-10-CM | POA: Diagnosis not present

## 2018-05-19 DIAGNOSIS — L409 Psoriasis, unspecified: Secondary | ICD-10-CM | POA: Diagnosis not present

## 2018-05-19 DIAGNOSIS — G8929 Other chronic pain: Secondary | ICD-10-CM | POA: Diagnosis not present

## 2018-05-19 DIAGNOSIS — L03314 Cellulitis of groin: Secondary | ICD-10-CM | POA: Diagnosis not present

## 2018-05-19 DIAGNOSIS — F329 Major depressive disorder, single episode, unspecified: Secondary | ICD-10-CM | POA: Diagnosis not present

## 2018-05-19 DIAGNOSIS — I10 Essential (primary) hypertension: Secondary | ICD-10-CM | POA: Diagnosis not present

## 2018-05-19 DIAGNOSIS — B372 Candidiasis of skin and nail: Secondary | ICD-10-CM | POA: Diagnosis not present

## 2018-05-19 DIAGNOSIS — M170 Bilateral primary osteoarthritis of knee: Secondary | ICD-10-CM | POA: Diagnosis not present

## 2018-05-19 DIAGNOSIS — J449 Chronic obstructive pulmonary disease, unspecified: Secondary | ICD-10-CM | POA: Diagnosis not present

## 2018-05-19 DIAGNOSIS — F411 Generalized anxiety disorder: Secondary | ICD-10-CM | POA: Diagnosis not present

## 2018-05-20 ENCOUNTER — Encounter: Payer: Self-pay | Admitting: Physician Assistant

## 2018-05-20 ENCOUNTER — Ambulatory Visit (INDEPENDENT_AMBULATORY_CARE_PROVIDER_SITE_OTHER): Payer: Medicare HMO | Admitting: Physician Assistant

## 2018-05-20 VITALS — BP 120/76 | HR 96 | Temp 97.4°F | Ht 64.0 in | Wt 190.4 lb

## 2018-05-20 DIAGNOSIS — I10 Essential (primary) hypertension: Secondary | ICD-10-CM | POA: Diagnosis not present

## 2018-05-20 DIAGNOSIS — F329 Major depressive disorder, single episode, unspecified: Secondary | ICD-10-CM | POA: Diagnosis not present

## 2018-05-20 DIAGNOSIS — M170 Bilateral primary osteoarthritis of knee: Secondary | ICD-10-CM | POA: Diagnosis not present

## 2018-05-20 DIAGNOSIS — F411 Generalized anxiety disorder: Secondary | ICD-10-CM | POA: Diagnosis not present

## 2018-05-20 DIAGNOSIS — L03115 Cellulitis of right lower limb: Secondary | ICD-10-CM

## 2018-05-20 DIAGNOSIS — Z23 Encounter for immunization: Secondary | ICD-10-CM

## 2018-05-20 DIAGNOSIS — L03314 Cellulitis of groin: Secondary | ICD-10-CM | POA: Diagnosis not present

## 2018-05-20 DIAGNOSIS — L97311 Non-pressure chronic ulcer of right ankle limited to breakdown of skin: Secondary | ICD-10-CM

## 2018-05-20 DIAGNOSIS — B372 Candidiasis of skin and nail: Secondary | ICD-10-CM | POA: Diagnosis not present

## 2018-05-20 DIAGNOSIS — L409 Psoriasis, unspecified: Secondary | ICD-10-CM | POA: Diagnosis not present

## 2018-05-20 DIAGNOSIS — G8929 Other chronic pain: Secondary | ICD-10-CM | POA: Diagnosis not present

## 2018-05-20 DIAGNOSIS — J449 Chronic obstructive pulmonary disease, unspecified: Secondary | ICD-10-CM | POA: Diagnosis not present

## 2018-05-20 MED ORDER — CLINDAMYCIN HCL 300 MG PO CAPS: 300.0000 mg | ORAL_CAPSULE | Freq: Three times a day (TID) | ORAL | 0 refills | Status: DC

## 2018-05-20 NOTE — Progress Notes (Signed)
BP 120/76   Pulse 96   Temp (!) 97.4 F (36.3 C) (Oral)   Ht 5\' 4"  (1.626 m)   Wt 190 lb 6.4 oz (86.4 kg)   BMI 32.68 kg/m    Subjective:    Patient ID: Denise FullerFrances S Sweetland, female    DOB: 10/31/1945, 73 y.o.   MRN: [id number]  HPI: Denise Underwood is a 73 y.o. female presenting on 05/20/2018 for Hypertension; Hyperlipidemia; Medical Management of Chronic Issues (3 month ); and Fall (went to ER on 12/27)  This patient comes in for periodic recheck and follow-up on the wound on her right lower leg.  On December 24 she had a fall where she scraped her leg.  There was a follow-up injury however she did not go to the emergency room at that time.  On December 27 she went to the emergency department because she was having some tachycardia and there was concern about atrial fibrillation but that was ruled out.  However the wound was seen and treated.  She was started on clindamycin.  She has completed the course that they had given her.  There is still some redness and peeling below the wound and it is still in ulcered state.  She is currently under care with [facility].  So I am to send another order for wound care services to be added onto her regular nursing services.  Past Medical History:  Diagnosis Date  . Anxiety   . Arthritis   . Hyperlipidemia   . Hypertension   . Psoriasis    Relevant past medical, surgical, family and social history reviewed and updated as indicated. Interim medical history since our last visit reviewed. Allergies and medications reviewed and updated. DATA REVIEWED: CHART IN EPIC  Family History reviewed for pertinent findings.  Review of Systems  Constitutional: Negative.   HENT: Negative.   Eyes: Negative.   Respiratory: Negative.   Cardiovascular: Positive for leg swelling. Negative for chest pain and palpitations.  Gastrointestinal: Negative.   Genitourinary: Negative.   Skin: Positive for color change and wound.    Allergies as of 05/20/2018   No  Known Allergies     Medication List       Accurate as of May 20, 2018 10:05 AM. Always use your most recent med list.        ALPRAZolam 0.5 MG tablet Commonly known as:  XANAX Take 0.5 mg by mouth every 12 (twelve) hours as needed for anxiety.   citalopram 20 MG tablet Commonly known as:  CELEXA Take 20 mg by mouth daily. For depression   clindamycin 300 MG capsule Commonly known as:  CLEOCIN Take 1 capsule (300 mg total) by mouth 3 (three) times daily.   Clobetasol Prop Emollient Base 0.05 % emollient cream Commonly known as:  CLOBETASOL PROPIONATE E Apply 1 application topically 2 (two) times daily. Apply to ECZEMA areas   Diclofenac Sodium 3 % Gel Place 4 g onto the skin 4 (four) times daily as needed.   Fish Oil 1000 MG Caps Take by mouth.   hydrochlorothiazide 25 MG tablet Commonly known as:  HYDRODIURIL Take 1 tablet (25 mg total) by mouth daily. As needed for fluid   nystatin powder Commonly known as:  MYCOSTATIN/NYSTOP Apply topically 4 (four) times daily.   RISA-BID PROBIOTIC Tabs Take 1 tablet by mouth twice a day   rosuvastatin 20 MG tablet Commonly known as:  CRESTOR Take 1 tablet (20 mg total) by mouth  daily.   traMADol 50 MG tablet Commonly known as:  ULTRAM Take 2 tablets (100 mg total) by mouth every 6 (six) hours as needed for moderate pain. For pain   Vitamin D3 25 MCG (1000 UT) Caps Take by mouth.          Objective:    BP 120/76   Pulse 96   Temp (!) 97.4 F (36.3 C) (Oral)   Ht 5\' 4"  (1.626 m)   Wt 190 lb 6.4 oz (86.4 kg)   BMI 32.68 kg/m   No Known Allergies  Wt Readings from Last 3 Encounters:  05/20/18 190 lb 6.4 oz (86.4 kg)  05/08/18 184 lb (83.5 kg)  05/08/18 193 lb (87.5 kg)    Physical Exam Constitutional:      Appearance: She is well-developed.  HENT:     Head: Normocephalic and atraumatic.  Eyes:     Conjunctiva/sclera: Conjunctivae normal.     Pupils: Pupils are equal, round, and reactive to light.    Cardiovascular:     Rate and Rhythm: Normal rate and regular rhythm.     Heart sounds: Normal heart sounds.  Pulmonary:     Effort: Pulmonary effort is normal.     Breath sounds: Normal breath sounds.  Abdominal:     General: Bowel sounds are normal.     Palpations: Abdomen is soft.  Skin:    General: Skin is warm and dry.     Findings: Signs of injury and wound present. No rash.          Comments: Center ulcer on right lateral leg. Red, swelling, slight drainage  Neurological:     Mental Status: She is alert and oriented to person, place, and time.     Deep Tendon Reflexes: Reflexes are normal and symmetric.  Psychiatric:        Behavior: Behavior normal.        Thought Content: Thought content normal.        Judgment: Judgment normal.     Results for orders placed or performed during the hospital encounter of 05/08/18  Basic metabolic panel  Result Value Ref Range   Sodium 140 135 - 145 mmol/L   Potassium 3.7 3.5 - 5.1 mmol/L   Chloride 104 98 - 111 mmol/L   CO2 28 22 - 32 mmol/L   Glucose, Bld 100 (H) 70 - 99 mg/dL   BUN 14 8 - 23 mg/dL   Creatinine, Ser 8.290.98 0.44 - 1.00 mg/dL   Calcium 8.8 (L) 8.9 - 10.3 mg/dL   GFR calc non Af Amer 57 (L) >60 mL/min   GFR calc Af Amer >60 >60 mL/min   Anion gap 8 5 - 15  CBC with Differential  Result Value Ref Range   WBC 17.4 (H) 4.0 - 10.5 K/uL   RBC 3.95 3.87 - 5.11 MIL/uL   Hemoglobin 12.0 12.0 - 15.0 g/dL   HCT 56.239.5 13.036.0 - 86.546.0 %   MCV 100.0 80.0 - 100.0 fL   MCH 30.4 26.0 - 34.0 pg   MCHC 30.4 30.0 - 36.0 g/dL   RDW 78.414.4 69.611.5 - 29.515.5 %   Platelets 183 150 - 400 K/uL   nRBC 0.0 0.0 - 0.2 %   Neutrophils Relative % 74 %   Neutro Abs 13.0 (H) 1.7 - 7.7 K/uL   Lymphocytes Relative 16 %   Lymphs Abs 2.7 0.7 - 4.0 K/uL   Monocytes Relative 7 %   Monocytes Absolute 1.2 (H) 0.1 -  1.0 K/uL   Eosinophils Relative 2 %   Eosinophils Absolute 0.4 0.0 - 0.5 K/uL   Basophils Relative 0 %   Basophils Absolute 0.0 0.0 - 0.1 K/uL    Immature Granulocytes 1 %   Abs Immature Granulocytes 0.08 (H) 0.00 - 0.07 K/uL  Troponin I - Once  Result Value Ref Range   Troponin I <0.03 <0.03 ng/mL      Assessment & Plan:   1. Skin ulcer of right ankle, limited to breakdown of skin (HCC) - clindamycin (CLEOCIN) 300 MG capsule; Take 1 capsule (300 mg total) by mouth 3 (three) times daily.  Dispense: 30 capsule; Refill: 0 - Ambulatory referral to Home Health  2. Cellulitis of right lower extremity - clindamycin (CLEOCIN) 300 MG capsule; Take 1 capsule (300 mg total) by mouth 3 (three) times daily.  Dispense: 30 capsule; Refill: 0 - Ambulatory referral to Home Health   Continue all other maintenance medications as listed above.  Follow up plan: Return in about 2 weeks (around 06/03/2018).  Educational handout given for survey  Remus Loffler PA-C [facility] [address]  [place], [place] [address] [phone]   05/20/2018, 10:05 AM

## 2018-05-21 DIAGNOSIS — L03314 Cellulitis of groin: Secondary | ICD-10-CM | POA: Diagnosis not present

## 2018-05-21 DIAGNOSIS — I10 Essential (primary) hypertension: Secondary | ICD-10-CM | POA: Diagnosis not present

## 2018-05-21 DIAGNOSIS — L409 Psoriasis, unspecified: Secondary | ICD-10-CM | POA: Diagnosis not present

## 2018-05-21 DIAGNOSIS — F329 Major depressive disorder, single episode, unspecified: Secondary | ICD-10-CM | POA: Diagnosis not present

## 2018-05-21 DIAGNOSIS — J449 Chronic obstructive pulmonary disease, unspecified: Secondary | ICD-10-CM | POA: Diagnosis not present

## 2018-05-21 DIAGNOSIS — G8929 Other chronic pain: Secondary | ICD-10-CM | POA: Diagnosis not present

## 2018-05-21 DIAGNOSIS — F411 Generalized anxiety disorder: Secondary | ICD-10-CM | POA: Diagnosis not present

## 2018-05-21 DIAGNOSIS — B372 Candidiasis of skin and nail: Secondary | ICD-10-CM | POA: Diagnosis not present

## 2018-05-21 DIAGNOSIS — M170 Bilateral primary osteoarthritis of knee: Secondary | ICD-10-CM | POA: Diagnosis not present

## 2018-05-22 DIAGNOSIS — B372 Candidiasis of skin and nail: Secondary | ICD-10-CM | POA: Diagnosis not present

## 2018-05-22 DIAGNOSIS — J449 Chronic obstructive pulmonary disease, unspecified: Secondary | ICD-10-CM | POA: Diagnosis not present

## 2018-05-22 DIAGNOSIS — F411 Generalized anxiety disorder: Secondary | ICD-10-CM | POA: Diagnosis not present

## 2018-05-22 DIAGNOSIS — G8929 Other chronic pain: Secondary | ICD-10-CM | POA: Diagnosis not present

## 2018-05-22 DIAGNOSIS — M170 Bilateral primary osteoarthritis of knee: Secondary | ICD-10-CM | POA: Diagnosis not present

## 2018-05-22 DIAGNOSIS — F329 Major depressive disorder, single episode, unspecified: Secondary | ICD-10-CM | POA: Diagnosis not present

## 2018-05-22 DIAGNOSIS — L409 Psoriasis, unspecified: Secondary | ICD-10-CM | POA: Diagnosis not present

## 2018-05-22 DIAGNOSIS — I10 Essential (primary) hypertension: Secondary | ICD-10-CM | POA: Diagnosis not present

## 2018-05-22 DIAGNOSIS — L03314 Cellulitis of groin: Secondary | ICD-10-CM | POA: Diagnosis not present

## 2018-05-22 NOTE — Telephone Encounter (Signed)
Tried to call, no answer, encounter is 55 days old, encounter closed

## 2018-05-25 DIAGNOSIS — F329 Major depressive disorder, single episode, unspecified: Secondary | ICD-10-CM | POA: Diagnosis not present

## 2018-05-25 DIAGNOSIS — G8929 Other chronic pain: Secondary | ICD-10-CM | POA: Diagnosis not present

## 2018-05-25 DIAGNOSIS — I10 Essential (primary) hypertension: Secondary | ICD-10-CM | POA: Diagnosis not present

## 2018-05-25 DIAGNOSIS — M170 Bilateral primary osteoarthritis of knee: Secondary | ICD-10-CM | POA: Diagnosis not present

## 2018-05-25 DIAGNOSIS — J449 Chronic obstructive pulmonary disease, unspecified: Secondary | ICD-10-CM | POA: Diagnosis not present

## 2018-05-25 DIAGNOSIS — L03314 Cellulitis of groin: Secondary | ICD-10-CM | POA: Diagnosis not present

## 2018-05-25 DIAGNOSIS — B372 Candidiasis of skin and nail: Secondary | ICD-10-CM | POA: Diagnosis not present

## 2018-05-25 DIAGNOSIS — L409 Psoriasis, unspecified: Secondary | ICD-10-CM | POA: Diagnosis not present

## 2018-05-25 DIAGNOSIS — F411 Generalized anxiety disorder: Secondary | ICD-10-CM | POA: Diagnosis not present

## 2018-05-26 DIAGNOSIS — M170 Bilateral primary osteoarthritis of knee: Secondary | ICD-10-CM | POA: Diagnosis not present

## 2018-05-26 DIAGNOSIS — G8929 Other chronic pain: Secondary | ICD-10-CM | POA: Diagnosis not present

## 2018-05-26 DIAGNOSIS — L03314 Cellulitis of groin: Secondary | ICD-10-CM | POA: Diagnosis not present

## 2018-05-26 DIAGNOSIS — I10 Essential (primary) hypertension: Secondary | ICD-10-CM | POA: Diagnosis not present

## 2018-05-26 DIAGNOSIS — F329 Major depressive disorder, single episode, unspecified: Secondary | ICD-10-CM | POA: Diagnosis not present

## 2018-05-26 DIAGNOSIS — F411 Generalized anxiety disorder: Secondary | ICD-10-CM | POA: Diagnosis not present

## 2018-05-26 DIAGNOSIS — L409 Psoriasis, unspecified: Secondary | ICD-10-CM | POA: Diagnosis not present

## 2018-05-26 DIAGNOSIS — J449 Chronic obstructive pulmonary disease, unspecified: Secondary | ICD-10-CM | POA: Diagnosis not present

## 2018-05-26 DIAGNOSIS — B372 Candidiasis of skin and nail: Secondary | ICD-10-CM | POA: Diagnosis not present

## 2018-05-27 DIAGNOSIS — L03314 Cellulitis of groin: Secondary | ICD-10-CM | POA: Diagnosis not present

## 2018-05-27 DIAGNOSIS — M170 Bilateral primary osteoarthritis of knee: Secondary | ICD-10-CM | POA: Diagnosis not present

## 2018-05-27 DIAGNOSIS — F329 Major depressive disorder, single episode, unspecified: Secondary | ICD-10-CM | POA: Diagnosis not present

## 2018-05-27 DIAGNOSIS — F411 Generalized anxiety disorder: Secondary | ICD-10-CM | POA: Diagnosis not present

## 2018-05-27 DIAGNOSIS — L409 Psoriasis, unspecified: Secondary | ICD-10-CM | POA: Diagnosis not present

## 2018-05-27 DIAGNOSIS — I10 Essential (primary) hypertension: Secondary | ICD-10-CM | POA: Diagnosis not present

## 2018-05-27 DIAGNOSIS — B372 Candidiasis of skin and nail: Secondary | ICD-10-CM | POA: Diagnosis not present

## 2018-05-27 DIAGNOSIS — J449 Chronic obstructive pulmonary disease, unspecified: Secondary | ICD-10-CM | POA: Diagnosis not present

## 2018-05-27 DIAGNOSIS — G8929 Other chronic pain: Secondary | ICD-10-CM | POA: Diagnosis not present

## 2018-05-28 DIAGNOSIS — F329 Major depressive disorder, single episode, unspecified: Secondary | ICD-10-CM | POA: Diagnosis not present

## 2018-05-28 DIAGNOSIS — B372 Candidiasis of skin and nail: Secondary | ICD-10-CM | POA: Diagnosis not present

## 2018-05-28 DIAGNOSIS — L409 Psoriasis, unspecified: Secondary | ICD-10-CM | POA: Diagnosis not present

## 2018-05-28 DIAGNOSIS — G8929 Other chronic pain: Secondary | ICD-10-CM | POA: Diagnosis not present

## 2018-05-28 DIAGNOSIS — F411 Generalized anxiety disorder: Secondary | ICD-10-CM | POA: Diagnosis not present

## 2018-05-28 DIAGNOSIS — J449 Chronic obstructive pulmonary disease, unspecified: Secondary | ICD-10-CM | POA: Diagnosis not present

## 2018-05-28 DIAGNOSIS — I10 Essential (primary) hypertension: Secondary | ICD-10-CM | POA: Diagnosis not present

## 2018-05-28 DIAGNOSIS — L03314 Cellulitis of groin: Secondary | ICD-10-CM | POA: Diagnosis not present

## 2018-05-28 DIAGNOSIS — M170 Bilateral primary osteoarthritis of knee: Secondary | ICD-10-CM | POA: Diagnosis not present

## 2018-05-29 DIAGNOSIS — G8929 Other chronic pain: Secondary | ICD-10-CM | POA: Diagnosis not present

## 2018-05-29 DIAGNOSIS — I10 Essential (primary) hypertension: Secondary | ICD-10-CM | POA: Diagnosis not present

## 2018-05-29 DIAGNOSIS — L409 Psoriasis, unspecified: Secondary | ICD-10-CM | POA: Diagnosis not present

## 2018-05-29 DIAGNOSIS — M170 Bilateral primary osteoarthritis of knee: Secondary | ICD-10-CM | POA: Diagnosis not present

## 2018-05-29 DIAGNOSIS — J449 Chronic obstructive pulmonary disease, unspecified: Secondary | ICD-10-CM | POA: Diagnosis not present

## 2018-05-29 DIAGNOSIS — L03314 Cellulitis of groin: Secondary | ICD-10-CM | POA: Diagnosis not present

## 2018-05-29 DIAGNOSIS — F411 Generalized anxiety disorder: Secondary | ICD-10-CM | POA: Diagnosis not present

## 2018-05-29 DIAGNOSIS — B372 Candidiasis of skin and nail: Secondary | ICD-10-CM | POA: Diagnosis not present

## 2018-05-29 DIAGNOSIS — F329 Major depressive disorder, single episode, unspecified: Secondary | ICD-10-CM | POA: Diagnosis not present

## 2018-06-01 DIAGNOSIS — F329 Major depressive disorder, single episode, unspecified: Secondary | ICD-10-CM | POA: Diagnosis not present

## 2018-06-01 DIAGNOSIS — L409 Psoriasis, unspecified: Secondary | ICD-10-CM | POA: Diagnosis not present

## 2018-06-01 DIAGNOSIS — J449 Chronic obstructive pulmonary disease, unspecified: Secondary | ICD-10-CM | POA: Diagnosis not present

## 2018-06-01 DIAGNOSIS — G8929 Other chronic pain: Secondary | ICD-10-CM | POA: Diagnosis not present

## 2018-06-01 DIAGNOSIS — M170 Bilateral primary osteoarthritis of knee: Secondary | ICD-10-CM | POA: Diagnosis not present

## 2018-06-01 DIAGNOSIS — L03314 Cellulitis of groin: Secondary | ICD-10-CM | POA: Diagnosis not present

## 2018-06-01 DIAGNOSIS — F411 Generalized anxiety disorder: Secondary | ICD-10-CM | POA: Diagnosis not present

## 2018-06-01 DIAGNOSIS — I10 Essential (primary) hypertension: Secondary | ICD-10-CM | POA: Diagnosis not present

## 2018-06-01 DIAGNOSIS — B372 Candidiasis of skin and nail: Secondary | ICD-10-CM | POA: Diagnosis not present

## 2018-06-02 DIAGNOSIS — L409 Psoriasis, unspecified: Secondary | ICD-10-CM | POA: Diagnosis not present

## 2018-06-02 DIAGNOSIS — F411 Generalized anxiety disorder: Secondary | ICD-10-CM | POA: Diagnosis not present

## 2018-06-02 DIAGNOSIS — F329 Major depressive disorder, single episode, unspecified: Secondary | ICD-10-CM | POA: Diagnosis not present

## 2018-06-02 DIAGNOSIS — M170 Bilateral primary osteoarthritis of knee: Secondary | ICD-10-CM | POA: Diagnosis not present

## 2018-06-02 DIAGNOSIS — L03314 Cellulitis of groin: Secondary | ICD-10-CM | POA: Diagnosis not present

## 2018-06-02 DIAGNOSIS — J449 Chronic obstructive pulmonary disease, unspecified: Secondary | ICD-10-CM | POA: Diagnosis not present

## 2018-06-02 DIAGNOSIS — I10 Essential (primary) hypertension: Secondary | ICD-10-CM | POA: Diagnosis not present

## 2018-06-02 DIAGNOSIS — B372 Candidiasis of skin and nail: Secondary | ICD-10-CM | POA: Diagnosis not present

## 2018-06-02 DIAGNOSIS — G8929 Other chronic pain: Secondary | ICD-10-CM | POA: Diagnosis not present

## 2018-06-03 ENCOUNTER — Telehealth: Payer: Self-pay | Admitting: *Deleted

## 2018-06-03 DIAGNOSIS — B372 Candidiasis of skin and nail: Secondary | ICD-10-CM | POA: Diagnosis not present

## 2018-06-03 DIAGNOSIS — L03314 Cellulitis of groin: Secondary | ICD-10-CM | POA: Diagnosis not present

## 2018-06-03 DIAGNOSIS — G8929 Other chronic pain: Secondary | ICD-10-CM | POA: Diagnosis not present

## 2018-06-03 DIAGNOSIS — F329 Major depressive disorder, single episode, unspecified: Secondary | ICD-10-CM | POA: Diagnosis not present

## 2018-06-03 DIAGNOSIS — L409 Psoriasis, unspecified: Secondary | ICD-10-CM | POA: Diagnosis not present

## 2018-06-03 DIAGNOSIS — J449 Chronic obstructive pulmonary disease, unspecified: Secondary | ICD-10-CM | POA: Diagnosis not present

## 2018-06-03 DIAGNOSIS — F411 Generalized anxiety disorder: Secondary | ICD-10-CM | POA: Diagnosis not present

## 2018-06-03 DIAGNOSIS — I10 Essential (primary) hypertension: Secondary | ICD-10-CM | POA: Diagnosis not present

## 2018-06-03 DIAGNOSIS — M170 Bilateral primary osteoarthritis of knee: Secondary | ICD-10-CM | POA: Diagnosis not present

## 2018-06-03 NOTE — Telephone Encounter (Signed)
Are her numbers any better, if she is feeling bad can go to ED or see me in a open spot?

## 2018-06-03 NOTE — Telephone Encounter (Signed)
Patient daughter states that she will call and talk with her and see how she is feeling and will call and let us know.

## 2018-06-03 NOTE — Telephone Encounter (Signed)
VM from [place] [facility] w/ [facility] FYI She was working w/ patient today, heart rate was consistently up at 100-104 & irregulary even after 10 mins of rest

## 2018-06-04 ENCOUNTER — Telehealth: Payer: Self-pay | Admitting: *Deleted

## 2018-06-04 NOTE — Telephone Encounter (Signed)
Incoming call from pt's daughter to report vitals Pt is not symptotic Pt states she feels fine Heart rate is 80-90 Pt has follow up appt 06/12/2018 Pt will come in sooner if elevated heart rate or symptomatic

## 2018-06-05 DIAGNOSIS — B372 Candidiasis of skin and nail: Secondary | ICD-10-CM | POA: Diagnosis not present

## 2018-06-05 DIAGNOSIS — M170 Bilateral primary osteoarthritis of knee: Secondary | ICD-10-CM | POA: Diagnosis not present

## 2018-06-05 DIAGNOSIS — I10 Essential (primary) hypertension: Secondary | ICD-10-CM | POA: Diagnosis not present

## 2018-06-05 DIAGNOSIS — L409 Psoriasis, unspecified: Secondary | ICD-10-CM | POA: Diagnosis not present

## 2018-06-05 DIAGNOSIS — L03314 Cellulitis of groin: Secondary | ICD-10-CM | POA: Diagnosis not present

## 2018-06-05 DIAGNOSIS — G8929 Other chronic pain: Secondary | ICD-10-CM | POA: Diagnosis not present

## 2018-06-05 DIAGNOSIS — F329 Major depressive disorder, single episode, unspecified: Secondary | ICD-10-CM | POA: Diagnosis not present

## 2018-06-05 DIAGNOSIS — J449 Chronic obstructive pulmonary disease, unspecified: Secondary | ICD-10-CM | POA: Diagnosis not present

## 2018-06-05 DIAGNOSIS — F411 Generalized anxiety disorder: Secondary | ICD-10-CM | POA: Diagnosis not present

## 2018-06-08 DIAGNOSIS — J449 Chronic obstructive pulmonary disease, unspecified: Secondary | ICD-10-CM | POA: Diagnosis not present

## 2018-06-08 DIAGNOSIS — I10 Essential (primary) hypertension: Secondary | ICD-10-CM | POA: Diagnosis not present

## 2018-06-08 DIAGNOSIS — L409 Psoriasis, unspecified: Secondary | ICD-10-CM | POA: Diagnosis not present

## 2018-06-08 DIAGNOSIS — F411 Generalized anxiety disorder: Secondary | ICD-10-CM | POA: Diagnosis not present

## 2018-06-08 DIAGNOSIS — B372 Candidiasis of skin and nail: Secondary | ICD-10-CM | POA: Diagnosis not present

## 2018-06-08 DIAGNOSIS — L03314 Cellulitis of groin: Secondary | ICD-10-CM | POA: Diagnosis not present

## 2018-06-08 DIAGNOSIS — M170 Bilateral primary osteoarthritis of knee: Secondary | ICD-10-CM | POA: Diagnosis not present

## 2018-06-08 DIAGNOSIS — G8929 Other chronic pain: Secondary | ICD-10-CM | POA: Diagnosis not present

## 2018-06-08 DIAGNOSIS — F329 Major depressive disorder, single episode, unspecified: Secondary | ICD-10-CM | POA: Diagnosis not present

## 2018-06-09 DIAGNOSIS — M170 Bilateral primary osteoarthritis of knee: Secondary | ICD-10-CM | POA: Diagnosis not present

## 2018-06-09 DIAGNOSIS — G8929 Other chronic pain: Secondary | ICD-10-CM | POA: Diagnosis not present

## 2018-06-09 DIAGNOSIS — J449 Chronic obstructive pulmonary disease, unspecified: Secondary | ICD-10-CM | POA: Diagnosis not present

## 2018-06-09 DIAGNOSIS — L409 Psoriasis, unspecified: Secondary | ICD-10-CM | POA: Diagnosis not present

## 2018-06-09 DIAGNOSIS — L03314 Cellulitis of groin: Secondary | ICD-10-CM | POA: Diagnosis not present

## 2018-06-09 DIAGNOSIS — F411 Generalized anxiety disorder: Secondary | ICD-10-CM | POA: Diagnosis not present

## 2018-06-09 DIAGNOSIS — B372 Candidiasis of skin and nail: Secondary | ICD-10-CM | POA: Diagnosis not present

## 2018-06-09 DIAGNOSIS — F329 Major depressive disorder, single episode, unspecified: Secondary | ICD-10-CM | POA: Diagnosis not present

## 2018-06-09 DIAGNOSIS — I10 Essential (primary) hypertension: Secondary | ICD-10-CM | POA: Diagnosis not present

## 2018-06-11 DIAGNOSIS — J449 Chronic obstructive pulmonary disease, unspecified: Secondary | ICD-10-CM | POA: Diagnosis not present

## 2018-06-11 DIAGNOSIS — F329 Major depressive disorder, single episode, unspecified: Secondary | ICD-10-CM | POA: Diagnosis not present

## 2018-06-11 DIAGNOSIS — F411 Generalized anxiety disorder: Secondary | ICD-10-CM | POA: Diagnosis not present

## 2018-06-11 DIAGNOSIS — L03314 Cellulitis of groin: Secondary | ICD-10-CM | POA: Diagnosis not present

## 2018-06-11 DIAGNOSIS — B372 Candidiasis of skin and nail: Secondary | ICD-10-CM | POA: Diagnosis not present

## 2018-06-11 DIAGNOSIS — M170 Bilateral primary osteoarthritis of knee: Secondary | ICD-10-CM | POA: Diagnosis not present

## 2018-06-11 DIAGNOSIS — L409 Psoriasis, unspecified: Secondary | ICD-10-CM | POA: Diagnosis not present

## 2018-06-11 DIAGNOSIS — I10 Essential (primary) hypertension: Secondary | ICD-10-CM | POA: Diagnosis not present

## 2018-06-11 DIAGNOSIS — G8929 Other chronic pain: Secondary | ICD-10-CM | POA: Diagnosis not present

## 2018-06-12 ENCOUNTER — Other Ambulatory Visit: Payer: Self-pay | Admitting: Physician Assistant

## 2018-06-12 ENCOUNTER — Encounter: Payer: Self-pay | Admitting: Physician Assistant

## 2018-06-12 ENCOUNTER — Ambulatory Visit (INDEPENDENT_AMBULATORY_CARE_PROVIDER_SITE_OTHER): Payer: Medicare HMO | Admitting: Physician Assistant

## 2018-06-12 VITALS — BP 121/69 | HR 87 | Temp 98.2°F | Ht 64.0 in | Wt 189.2 lb

## 2018-06-12 DIAGNOSIS — M150 Primary generalized (osteo)arthritis: Secondary | ICD-10-CM

## 2018-06-12 DIAGNOSIS — L03115 Cellulitis of right lower limb: Secondary | ICD-10-CM | POA: Diagnosis not present

## 2018-06-12 DIAGNOSIS — Z9181 History of falling: Secondary | ICD-10-CM

## 2018-06-12 DIAGNOSIS — E7841 Elevated Lipoprotein(a): Secondary | ICD-10-CM

## 2018-06-12 DIAGNOSIS — L97311 Non-pressure chronic ulcer of right ankle limited to breakdown of skin: Secondary | ICD-10-CM | POA: Diagnosis not present

## 2018-06-12 DIAGNOSIS — M159 Polyosteoarthritis, unspecified: Secondary | ICD-10-CM

## 2018-06-12 MED ORDER — CITALOPRAM HYDROBROMIDE 40 MG PO TABS: 40.0000 mg | ORAL_TABLET | Freq: Every day | ORAL | 0 refills | Status: DC

## 2018-06-12 MED ORDER — CLINDAMYCIN HCL 300 MG PO CAPS: 300.0000 mg | ORAL_CAPSULE | Freq: Three times a day (TID) | ORAL | 1 refills | Status: DC

## 2018-06-12 MED ORDER — ALPRAZOLAM 0.5 MG PO TABS: 0.5000 mg | ORAL_TABLET | Freq: Three times a day (TID) | ORAL | 5 refills | Status: DC | PRN

## 2018-06-12 MED ORDER — FLUCONAZOLE 150 MG PO TABS: ORAL_TABLET | ORAL | 5 refills | Status: DC

## 2018-06-12 MED ORDER — TRAMADOL HCL 50 MG PO TABS: 100.0000 mg | ORAL_TABLET | Freq: Four times a day (QID) | ORAL | 5 refills | Status: DC | PRN

## 2018-06-13 ENCOUNTER — Other Ambulatory Visit: Payer: Self-pay | Admitting: Physician Assistant

## 2018-06-13 DIAGNOSIS — M150 Primary generalized (osteo)arthritis: Principal | ICD-10-CM

## 2018-06-13 DIAGNOSIS — M159 Polyosteoarthritis, unspecified: Secondary | ICD-10-CM

## 2018-06-15 ENCOUNTER — Other Ambulatory Visit: Payer: Self-pay | Admitting: Physician Assistant

## 2018-06-15 DIAGNOSIS — M170 Bilateral primary osteoarthritis of knee: Secondary | ICD-10-CM | POA: Diagnosis not present

## 2018-06-15 DIAGNOSIS — I10 Essential (primary) hypertension: Secondary | ICD-10-CM | POA: Diagnosis not present

## 2018-06-15 DIAGNOSIS — G8929 Other chronic pain: Secondary | ICD-10-CM | POA: Diagnosis not present

## 2018-06-15 DIAGNOSIS — B372 Candidiasis of skin and nail: Secondary | ICD-10-CM | POA: Diagnosis not present

## 2018-06-15 DIAGNOSIS — M150 Primary generalized (osteo)arthritis: Secondary | ICD-10-CM

## 2018-06-15 DIAGNOSIS — F329 Major depressive disorder, single episode, unspecified: Secondary | ICD-10-CM | POA: Diagnosis not present

## 2018-06-15 DIAGNOSIS — E7841 Elevated Lipoprotein(a): Secondary | ICD-10-CM

## 2018-06-15 DIAGNOSIS — F411 Generalized anxiety disorder: Secondary | ICD-10-CM | POA: Diagnosis not present

## 2018-06-15 DIAGNOSIS — M159 Polyosteoarthritis, unspecified: Secondary | ICD-10-CM | POA: Insufficient documentation

## 2018-06-15 DIAGNOSIS — L03314 Cellulitis of groin: Secondary | ICD-10-CM | POA: Diagnosis not present

## 2018-06-15 DIAGNOSIS — Z9181 History of falling: Secondary | ICD-10-CM | POA: Insufficient documentation

## 2018-06-15 DIAGNOSIS — J449 Chronic obstructive pulmonary disease, unspecified: Secondary | ICD-10-CM | POA: Diagnosis not present

## 2018-06-15 DIAGNOSIS — L409 Psoriasis, unspecified: Secondary | ICD-10-CM | POA: Diagnosis not present

## 2018-06-15 NOTE — Telephone Encounter (Signed)
Needs to be seen

## 2018-06-15 NOTE — Progress Notes (Signed)
BP 121/69   Pulse 87   Temp 98.2 F (36.8 C) (Oral)   Ht 5\' 4"  (1.626 m)   Wt 189 lb 3.2 oz (85.8 kg)   BMI 32.48 kg/m    Subjective:    Patient ID: Denise FullerFrances S Vorce, female    DOB: 08/28/1945, 73 y.o.   MRN: [id number]  HPI: Denise Underwood is a 73 y.o. female presenting on 06/12/2018 for sore on leg (2 week rck )  Patient is here for recheck on her lower leg ulcer and cellulitis.  Wound care is currently going to her.  It seems to be healing fairly well.  There is some added redness around the wound at this time.  She states that overall she is starting to feel a little bit stronger.  She still has a lot of trouble getting daily things performed.  She was quite tired and a lot of times will be afraid to try to take her own shower and prepare meals.   Due to her continued decrease in ability to do all of her ADLs, we would like for her to have more care services with a home health worker.  She is currently under the care of home health receiving wound care and physical and occupational therapy services.  However they are to stay her briefly.  She is having difficulty with preparing food, bathing and normal grooming activity. Past Medical History:  Diagnosis Date  . Anxiety   . Arthritis   . Hyperlipidemia   . Hypertension   . Psoriasis    Relevant past medical, surgical, family and social history reviewed and updated as indicated. Interim medical history since our last visit reviewed. Allergies and medications reviewed and updated. DATA REVIEWED: CHART IN EPIC  Family History reviewed for pertinent findings.  Review of Systems  Constitutional: Negative.  Negative for activity change, fatigue and fever.  HENT: Negative.   Eyes: Negative.   Respiratory: Negative.  Negative for cough.   Cardiovascular: Negative.  Negative for chest pain.  Gastrointestinal: Negative.  Negative for abdominal pain.  Endocrine: Negative.   Genitourinary: Negative.  Negative for dysuria.    Musculoskeletal: Positive for arthralgias, back pain, gait problem and myalgias.  Skin: Positive for color change, rash and wound.    Allergies as of 06/12/2018   No Known Allergies     Medication List       Accurate as of June 12, 2018 11:59 PM. Always use your most recent med list.        ALPRAZolam 0.5 MG tablet Commonly known as:  XANAX Take 1 tablet (0.5 mg total) by mouth 3 (three) times daily as needed for anxiety.   citalopram 40 MG tablet Commonly known as:  CELEXA Take 1 tablet (40 mg total) by mouth daily. For depression   clindamycin 300 MG capsule Commonly known as:  CLEOCIN Take 1 capsule (300 mg total) by mouth 3 (three) times daily.   Clobetasol Prop Emollient Base 0.05 % emollient cream Commonly known as:  CLOBETASOL PROPIONATE E Apply 1 application topically 2 (two) times daily. Apply to ECZEMA areas   Diclofenac Sodium 3 % Gel Place 4 g onto the skin 4 (four) times daily as needed.   Fish Oil 1000 MG Caps Take by mouth.   fluconazole 150 MG tablet Commonly known as:  DIFLUCAN 1 po q week x 4 weeks   hydrochlorothiazide 25 MG tablet Commonly known as:  HYDRODIURIL Take 1 tablet (25 mg total) by  mouth daily. As needed for fluid   nystatin powder Commonly known as:  MYCOSTATIN/NYSTOP Apply topically 4 (four) times daily.   RISA-BID PROBIOTIC Tabs Take 1 tablet by mouth twice a day   rosuvastatin 20 MG tablet Commonly known as:  CRESTOR Take 1 tablet (20 mg total) by mouth daily.   traMADol 50 MG tablet Commonly known as:  ULTRAM Take 2 tablets (100 mg total) by mouth every 6 (six) hours as needed for moderate pain. For pain   Vitamin D3 25 MCG (1000 UT) Caps Take by mouth.          Objective:    BP 121/69   Pulse 87   Temp 98.2 F (36.8 C) (Oral)   Ht 5\' 4"  (1.626 m)   Wt 189 lb 3.2 oz (85.8 kg)   BMI 32.48 kg/m   No Known Allergies  Wt Readings from Last 3 Encounters:  06/12/18 189 lb 3.2 oz (85.8 kg)  05/20/18 190  lb 6.4 oz (86.4 kg)  05/08/18 184 lb (83.5 kg)    Physical Exam Constitutional:      Appearance: She is well-developed.  HENT:     Head: Normocephalic and atraumatic.  Eyes:     Conjunctiva/sclera: Conjunctivae normal.     Pupils: Pupils are equal, round, and reactive to light.  Cardiovascular:     Rate and Rhythm: Normal rate and regular rhythm.     Heart sounds: Normal heart sounds.  Pulmonary:     Effort: Pulmonary effort is normal.     Breath sounds: Normal breath sounds.  Abdominal:     General: Bowel sounds are normal.     Palpations: Abdomen is soft.  Skin:    General: Skin is warm and dry.     Findings: Erythema, lesion and wound present. No rash.          Comments: Healing ulcer  Neurological:     Mental Status: She is alert and oriented to person, place, and time.     Deep Tendon Reflexes: Reflexes are normal and symmetric.  Psychiatric:        Behavior: Behavior normal.        Thought Content: Thought content normal.        Judgment: Judgment normal.     Results for orders placed or performed during the hospital encounter of 05/08/18  Basic metabolic panel  Result Value Ref Range   Sodium 140 135 - 145 mmol/L   Potassium 3.7 3.5 - 5.1 mmol/L   Chloride 104 98 - 111 mmol/L   CO2 28 22 - 32 mmol/L   Glucose, Bld 100 (H) 70 - 99 mg/dL   BUN 14 8 - 23 mg/dL   Creatinine, Ser 1.61 0.44 - 1.00 mg/dL   Calcium 8.8 (L) 8.9 - 10.3 mg/dL   GFR calc non Af Amer 57 (L) >60 mL/min   GFR calc Af Amer >60 >60 mL/min   Anion gap 8 5 - 15  CBC with Differential  Result Value Ref Range   WBC 17.4 (H) 4.0 - 10.5 K/uL   RBC 3.95 3.87 - 5.11 MIL/uL   Hemoglobin 12.0 12.0 - 15.0 g/dL   HCT 09.6 04.5 - 40.9 %   MCV 100.0 80.0 - 100.0 fL   MCH 30.4 26.0 - 34.0 pg   MCHC 30.4 30.0 - 36.0 g/dL   RDW 81.1 91.4 - 78.2 %   Platelets 183 150 - 400 K/uL   nRBC 0.0 0.0 - 0.2 %  Neutrophils Relative % 74 %   Neutro Abs 13.0 (H) 1.7 - 7.7 K/uL   Lymphocytes Relative 16 %    Lymphs Abs 2.7 0.7 - 4.0 K/uL   Monocytes Relative 7 %   Monocytes Absolute 1.2 (H) 0.1 - 1.0 K/uL   Eosinophils Relative 2 %   Eosinophils Absolute 0.4 0.0 - 0.5 K/uL   Basophils Relative 0 %   Basophils Absolute 0.0 0.0 - 0.1 K/uL   Immature Granulocytes 1 %   Abs Immature Granulocytes 0.08 (H) 0.00 - 0.07 K/uL  Troponin I - Once  Result Value Ref Range   Troponin I <0.03 <0.03 ng/mL      Assessment & Plan:   1. Primary osteoarthritis involving multiple joints - traMADol (ULTRAM) 50 MG tablet; Take 2 tablets (100 mg total) by mouth every 6 (six) hours as needed for moderate pain. For pain  Dispense: 30 tablet; Refill: 5 - Ambulatory referral to Home Health  2. Skin ulcer of right ankle, limited to breakdown of skin (HCC) - clindamycin (CLEOCIN) 300 MG capsule; Take 1 capsule (300 mg total) by mouth 3 (three) times daily.  Dispense: 30 capsule; Refill: 1 - Ambulatory referral to Home Health  3. Cellulitis of right lower extremity - clindamycin (CLEOCIN) 300 MG capsule; Take 1 capsule (300 mg total) by mouth 3 (three) times daily.  Dispense: 30 capsule; Refill: 1  4. At high risk for falls - Ambulatory referral to Home Health    Continue all other maintenance medications as listed above.  Follow up plan: Return in about 6 weeks (around 07/24/2018).  Educational handout given for survey  Remus Loffler PA-C [facility] [address]  [place], [place] [address] [phone]   06/15/2018, 7:49 AM

## 2018-06-15 NOTE — Telephone Encounter (Signed)
Pharmacy never received Tramadol RX from 1/31- please re send

## 2018-06-18 DIAGNOSIS — I10 Essential (primary) hypertension: Secondary | ICD-10-CM | POA: Diagnosis not present

## 2018-06-18 DIAGNOSIS — J449 Chronic obstructive pulmonary disease, unspecified: Secondary | ICD-10-CM | POA: Diagnosis not present

## 2018-06-18 DIAGNOSIS — L409 Psoriasis, unspecified: Secondary | ICD-10-CM | POA: Diagnosis not present

## 2018-06-18 DIAGNOSIS — F329 Major depressive disorder, single episode, unspecified: Secondary | ICD-10-CM | POA: Diagnosis not present

## 2018-06-18 DIAGNOSIS — M170 Bilateral primary osteoarthritis of knee: Secondary | ICD-10-CM | POA: Diagnosis not present

## 2018-06-18 DIAGNOSIS — B372 Candidiasis of skin and nail: Secondary | ICD-10-CM | POA: Diagnosis not present

## 2018-06-18 DIAGNOSIS — F411 Generalized anxiety disorder: Secondary | ICD-10-CM | POA: Diagnosis not present

## 2018-06-18 DIAGNOSIS — G8929 Other chronic pain: Secondary | ICD-10-CM | POA: Diagnosis not present

## 2018-06-18 DIAGNOSIS — L03314 Cellulitis of groin: Secondary | ICD-10-CM | POA: Diagnosis not present

## 2018-06-23 DIAGNOSIS — J449 Chronic obstructive pulmonary disease, unspecified: Secondary | ICD-10-CM | POA: Diagnosis not present

## 2018-06-23 DIAGNOSIS — M170 Bilateral primary osteoarthritis of knee: Secondary | ICD-10-CM | POA: Diagnosis not present

## 2018-06-23 DIAGNOSIS — S81801A Unspecified open wound, right lower leg, initial encounter: Secondary | ICD-10-CM | POA: Diagnosis not present

## 2018-06-23 DIAGNOSIS — L409 Psoriasis, unspecified: Secondary | ICD-10-CM | POA: Diagnosis not present

## 2018-06-23 DIAGNOSIS — I10 Essential (primary) hypertension: Secondary | ICD-10-CM | POA: Diagnosis not present

## 2018-06-23 DIAGNOSIS — G8929 Other chronic pain: Secondary | ICD-10-CM | POA: Diagnosis not present

## 2018-06-23 DIAGNOSIS — F411 Generalized anxiety disorder: Secondary | ICD-10-CM | POA: Diagnosis not present

## 2018-06-23 DIAGNOSIS — E785 Hyperlipidemia, unspecified: Secondary | ICD-10-CM | POA: Diagnosis not present

## 2018-06-23 DIAGNOSIS — F329 Major depressive disorder, single episode, unspecified: Secondary | ICD-10-CM | POA: Diagnosis not present

## 2018-06-26 ENCOUNTER — Ambulatory Visit (INDEPENDENT_AMBULATORY_CARE_PROVIDER_SITE_OTHER): Payer: Medicare HMO

## 2018-06-26 DIAGNOSIS — S81801A Unspecified open wound, right lower leg, initial encounter: Secondary | ICD-10-CM | POA: Diagnosis not present

## 2018-06-26 DIAGNOSIS — F411 Generalized anxiety disorder: Secondary | ICD-10-CM | POA: Diagnosis not present

## 2018-06-26 DIAGNOSIS — J449 Chronic obstructive pulmonary disease, unspecified: Secondary | ICD-10-CM | POA: Diagnosis not present

## 2018-06-26 DIAGNOSIS — G8929 Other chronic pain: Secondary | ICD-10-CM

## 2018-06-26 DIAGNOSIS — I10 Essential (primary) hypertension: Secondary | ICD-10-CM | POA: Diagnosis not present

## 2018-06-26 DIAGNOSIS — L409 Psoriasis, unspecified: Secondary | ICD-10-CM

## 2018-06-26 DIAGNOSIS — M170 Bilateral primary osteoarthritis of knee: Secondary | ICD-10-CM | POA: Diagnosis not present

## 2018-06-26 DIAGNOSIS — F329 Major depressive disorder, single episode, unspecified: Secondary | ICD-10-CM

## 2018-06-26 DIAGNOSIS — F17210 Nicotine dependence, cigarettes, uncomplicated: Secondary | ICD-10-CM

## 2018-06-26 DIAGNOSIS — Z9181 History of falling: Secondary | ICD-10-CM

## 2018-06-26 DIAGNOSIS — E785 Hyperlipidemia, unspecified: Secondary | ICD-10-CM

## 2018-06-29 DIAGNOSIS — M170 Bilateral primary osteoarthritis of knee: Secondary | ICD-10-CM | POA: Diagnosis not present

## 2018-06-29 DIAGNOSIS — E785 Hyperlipidemia, unspecified: Secondary | ICD-10-CM | POA: Diagnosis not present

## 2018-06-29 DIAGNOSIS — F411 Generalized anxiety disorder: Secondary | ICD-10-CM | POA: Diagnosis not present

## 2018-06-29 DIAGNOSIS — L409 Psoriasis, unspecified: Secondary | ICD-10-CM | POA: Diagnosis not present

## 2018-06-29 DIAGNOSIS — M150 Primary generalized (osteo)arthritis: Secondary | ICD-10-CM | POA: Diagnosis not present

## 2018-06-29 DIAGNOSIS — J449 Chronic obstructive pulmonary disease, unspecified: Secondary | ICD-10-CM | POA: Diagnosis not present

## 2018-06-29 DIAGNOSIS — G8929 Other chronic pain: Secondary | ICD-10-CM | POA: Diagnosis not present

## 2018-06-29 DIAGNOSIS — S81801A Unspecified open wound, right lower leg, initial encounter: Secondary | ICD-10-CM | POA: Diagnosis not present

## 2018-06-29 DIAGNOSIS — I10 Essential (primary) hypertension: Secondary | ICD-10-CM | POA: Diagnosis not present

## 2018-06-29 DIAGNOSIS — L03115 Cellulitis of right lower limb: Secondary | ICD-10-CM | POA: Diagnosis not present

## 2018-06-29 DIAGNOSIS — F329 Major depressive disorder, single episode, unspecified: Secondary | ICD-10-CM | POA: Diagnosis not present

## 2018-07-01 DIAGNOSIS — M150 Primary generalized (osteo)arthritis: Secondary | ICD-10-CM | POA: Diagnosis not present

## 2018-07-01 DIAGNOSIS — L03115 Cellulitis of right lower limb: Secondary | ICD-10-CM | POA: Diagnosis not present

## 2018-07-02 DIAGNOSIS — L03115 Cellulitis of right lower limb: Secondary | ICD-10-CM | POA: Diagnosis not present

## 2018-07-02 DIAGNOSIS — M170 Bilateral primary osteoarthritis of knee: Secondary | ICD-10-CM | POA: Diagnosis not present

## 2018-07-02 DIAGNOSIS — J449 Chronic obstructive pulmonary disease, unspecified: Secondary | ICD-10-CM | POA: Diagnosis not present

## 2018-07-02 DIAGNOSIS — S81801A Unspecified open wound, right lower leg, initial encounter: Secondary | ICD-10-CM | POA: Diagnosis not present

## 2018-07-02 DIAGNOSIS — L409 Psoriasis, unspecified: Secondary | ICD-10-CM | POA: Diagnosis not present

## 2018-07-02 DIAGNOSIS — I10 Essential (primary) hypertension: Secondary | ICD-10-CM | POA: Diagnosis not present

## 2018-07-02 DIAGNOSIS — M150 Primary generalized (osteo)arthritis: Secondary | ICD-10-CM | POA: Diagnosis not present

## 2018-07-02 DIAGNOSIS — F329 Major depressive disorder, single episode, unspecified: Secondary | ICD-10-CM | POA: Diagnosis not present

## 2018-07-02 DIAGNOSIS — G8929 Other chronic pain: Secondary | ICD-10-CM | POA: Diagnosis not present

## 2018-07-02 DIAGNOSIS — E785 Hyperlipidemia, unspecified: Secondary | ICD-10-CM | POA: Diagnosis not present

## 2018-07-02 DIAGNOSIS — F411 Generalized anxiety disorder: Secondary | ICD-10-CM | POA: Diagnosis not present

## 2018-07-03 DIAGNOSIS — L03115 Cellulitis of right lower limb: Secondary | ICD-10-CM | POA: Diagnosis not present

## 2018-07-03 DIAGNOSIS — M150 Primary generalized (osteo)arthritis: Secondary | ICD-10-CM | POA: Diagnosis not present

## 2018-07-06 DIAGNOSIS — L03115 Cellulitis of right lower limb: Secondary | ICD-10-CM | POA: Diagnosis not present

## 2018-07-06 DIAGNOSIS — M150 Primary generalized (osteo)arthritis: Secondary | ICD-10-CM | POA: Diagnosis not present

## 2018-07-07 DIAGNOSIS — J449 Chronic obstructive pulmonary disease, unspecified: Secondary | ICD-10-CM | POA: Diagnosis not present

## 2018-07-07 DIAGNOSIS — L409 Psoriasis, unspecified: Secondary | ICD-10-CM | POA: Diagnosis not present

## 2018-07-07 DIAGNOSIS — F329 Major depressive disorder, single episode, unspecified: Secondary | ICD-10-CM | POA: Diagnosis not present

## 2018-07-07 DIAGNOSIS — E785 Hyperlipidemia, unspecified: Secondary | ICD-10-CM | POA: Diagnosis not present

## 2018-07-07 DIAGNOSIS — F411 Generalized anxiety disorder: Secondary | ICD-10-CM | POA: Diagnosis not present

## 2018-07-07 DIAGNOSIS — G8929 Other chronic pain: Secondary | ICD-10-CM | POA: Diagnosis not present

## 2018-07-07 DIAGNOSIS — M170 Bilateral primary osteoarthritis of knee: Secondary | ICD-10-CM | POA: Diagnosis not present

## 2018-07-07 DIAGNOSIS — I10 Essential (primary) hypertension: Secondary | ICD-10-CM | POA: Diagnosis not present

## 2018-07-07 DIAGNOSIS — S81801A Unspecified open wound, right lower leg, initial encounter: Secondary | ICD-10-CM | POA: Diagnosis not present

## 2018-07-08 DIAGNOSIS — L03115 Cellulitis of right lower limb: Secondary | ICD-10-CM | POA: Diagnosis not present

## 2018-07-08 DIAGNOSIS — M150 Primary generalized (osteo)arthritis: Secondary | ICD-10-CM | POA: Diagnosis not present

## 2018-07-09 DIAGNOSIS — M150 Primary generalized (osteo)arthritis: Secondary | ICD-10-CM | POA: Diagnosis not present

## 2018-07-09 DIAGNOSIS — L03115 Cellulitis of right lower limb: Secondary | ICD-10-CM | POA: Diagnosis not present

## 2018-07-10 DIAGNOSIS — F329 Major depressive disorder, single episode, unspecified: Secondary | ICD-10-CM | POA: Diagnosis not present

## 2018-07-10 DIAGNOSIS — F411 Generalized anxiety disorder: Secondary | ICD-10-CM | POA: Diagnosis not present

## 2018-07-10 DIAGNOSIS — G8929 Other chronic pain: Secondary | ICD-10-CM | POA: Diagnosis not present

## 2018-07-10 DIAGNOSIS — S81801A Unspecified open wound, right lower leg, initial encounter: Secondary | ICD-10-CM | POA: Diagnosis not present

## 2018-07-10 DIAGNOSIS — L409 Psoriasis, unspecified: Secondary | ICD-10-CM | POA: Diagnosis not present

## 2018-07-10 DIAGNOSIS — J449 Chronic obstructive pulmonary disease, unspecified: Secondary | ICD-10-CM | POA: Diagnosis not present

## 2018-07-10 DIAGNOSIS — M150 Primary generalized (osteo)arthritis: Secondary | ICD-10-CM | POA: Diagnosis not present

## 2018-07-10 DIAGNOSIS — M170 Bilateral primary osteoarthritis of knee: Secondary | ICD-10-CM | POA: Diagnosis not present

## 2018-07-10 DIAGNOSIS — L03115 Cellulitis of right lower limb: Secondary | ICD-10-CM | POA: Diagnosis not present

## 2018-07-10 DIAGNOSIS — E785 Hyperlipidemia, unspecified: Secondary | ICD-10-CM | POA: Diagnosis not present

## 2018-07-10 DIAGNOSIS — I10 Essential (primary) hypertension: Secondary | ICD-10-CM | POA: Diagnosis not present

## 2018-07-13 ENCOUNTER — Other Ambulatory Visit: Payer: Self-pay | Admitting: Physician Assistant

## 2018-07-13 DIAGNOSIS — L03115 Cellulitis of right lower limb: Secondary | ICD-10-CM | POA: Diagnosis not present

## 2018-07-13 DIAGNOSIS — F329 Major depressive disorder, single episode, unspecified: Secondary | ICD-10-CM | POA: Diagnosis not present

## 2018-07-13 DIAGNOSIS — J449 Chronic obstructive pulmonary disease, unspecified: Secondary | ICD-10-CM | POA: Diagnosis not present

## 2018-07-13 DIAGNOSIS — E785 Hyperlipidemia, unspecified: Secondary | ICD-10-CM | POA: Diagnosis not present

## 2018-07-13 DIAGNOSIS — M150 Primary generalized (osteo)arthritis: Principal | ICD-10-CM

## 2018-07-13 DIAGNOSIS — S81801A Unspecified open wound, right lower leg, initial encounter: Secondary | ICD-10-CM | POA: Diagnosis not present

## 2018-07-13 DIAGNOSIS — G8929 Other chronic pain: Secondary | ICD-10-CM | POA: Diagnosis not present

## 2018-07-13 DIAGNOSIS — F411 Generalized anxiety disorder: Secondary | ICD-10-CM | POA: Diagnosis not present

## 2018-07-13 DIAGNOSIS — E7841 Elevated Lipoprotein(a): Secondary | ICD-10-CM

## 2018-07-13 DIAGNOSIS — L409 Psoriasis, unspecified: Secondary | ICD-10-CM | POA: Diagnosis not present

## 2018-07-13 DIAGNOSIS — I10 Essential (primary) hypertension: Secondary | ICD-10-CM | POA: Diagnosis not present

## 2018-07-13 DIAGNOSIS — M170 Bilateral primary osteoarthritis of knee: Secondary | ICD-10-CM | POA: Diagnosis not present

## 2018-07-13 DIAGNOSIS — M159 Polyosteoarthritis, unspecified: Secondary | ICD-10-CM

## 2018-07-15 ENCOUNTER — Telehealth: Payer: Self-pay | Admitting: Physician Assistant

## 2018-07-15 ENCOUNTER — Other Ambulatory Visit: Payer: Self-pay | Admitting: Physician Assistant

## 2018-07-15 DIAGNOSIS — L03115 Cellulitis of right lower limb: Secondary | ICD-10-CM | POA: Diagnosis not present

## 2018-07-15 DIAGNOSIS — M150 Primary generalized (osteo)arthritis: Secondary | ICD-10-CM | POA: Diagnosis not present

## 2018-07-15 DIAGNOSIS — M159 Polyosteoarthritis, unspecified: Secondary | ICD-10-CM

## 2018-07-15 MED ORDER — TRAMADOL HCL 50 MG PO TABS: 50.0000 mg | ORAL_TABLET | Freq: Three times a day (TID) | ORAL | 0 refills | Status: DC | PRN

## 2018-07-15 NOTE — Telephone Encounter (Signed)
Patient daughter aware. °

## 2018-07-15 NOTE — Telephone Encounter (Signed)
I did not put a refill on this and it can have refills.  I have sent it for 1 time.  But I would like her to keep the appointment she has on March 9.

## 2018-07-16 DIAGNOSIS — L03115 Cellulitis of right lower limb: Secondary | ICD-10-CM | POA: Diagnosis not present

## 2018-07-16 DIAGNOSIS — M150 Primary generalized (osteo)arthritis: Secondary | ICD-10-CM | POA: Diagnosis not present

## 2018-07-17 DIAGNOSIS — G8929 Other chronic pain: Secondary | ICD-10-CM | POA: Diagnosis not present

## 2018-07-17 DIAGNOSIS — J449 Chronic obstructive pulmonary disease, unspecified: Secondary | ICD-10-CM | POA: Diagnosis not present

## 2018-07-17 DIAGNOSIS — M170 Bilateral primary osteoarthritis of knee: Secondary | ICD-10-CM | POA: Diagnosis not present

## 2018-07-17 DIAGNOSIS — S81801A Unspecified open wound, right lower leg, initial encounter: Secondary | ICD-10-CM | POA: Diagnosis not present

## 2018-07-17 DIAGNOSIS — I10 Essential (primary) hypertension: Secondary | ICD-10-CM | POA: Diagnosis not present

## 2018-07-17 DIAGNOSIS — F329 Major depressive disorder, single episode, unspecified: Secondary | ICD-10-CM | POA: Diagnosis not present

## 2018-07-17 DIAGNOSIS — L409 Psoriasis, unspecified: Secondary | ICD-10-CM | POA: Diagnosis not present

## 2018-07-17 DIAGNOSIS — F411 Generalized anxiety disorder: Secondary | ICD-10-CM | POA: Diagnosis not present

## 2018-07-17 DIAGNOSIS — E785 Hyperlipidemia, unspecified: Secondary | ICD-10-CM | POA: Diagnosis not present

## 2018-07-17 DIAGNOSIS — L03115 Cellulitis of right lower limb: Secondary | ICD-10-CM | POA: Diagnosis not present

## 2018-07-17 DIAGNOSIS — M150 Primary generalized (osteo)arthritis: Secondary | ICD-10-CM | POA: Diagnosis not present

## 2018-07-20 DIAGNOSIS — L03115 Cellulitis of right lower limb: Secondary | ICD-10-CM | POA: Diagnosis not present

## 2018-07-20 DIAGNOSIS — M150 Primary generalized (osteo)arthritis: Secondary | ICD-10-CM | POA: Diagnosis not present

## 2018-07-21 ENCOUNTER — Ambulatory Visit (INDEPENDENT_AMBULATORY_CARE_PROVIDER_SITE_OTHER): Payer: Medicare HMO | Admitting: Physician Assistant

## 2018-07-21 ENCOUNTER — Encounter: Payer: Self-pay | Admitting: Physician Assistant

## 2018-07-21 VITALS — BP 110/66 | HR 78 | Temp 99.4°F | Ht 64.0 in | Wt 180.4 lb

## 2018-07-21 DIAGNOSIS — M25562 Pain in left knee: Secondary | ICD-10-CM | POA: Diagnosis not present

## 2018-07-21 DIAGNOSIS — I10 Essential (primary) hypertension: Secondary | ICD-10-CM | POA: Diagnosis not present

## 2018-07-21 DIAGNOSIS — M150 Primary generalized (osteo)arthritis: Secondary | ICD-10-CM

## 2018-07-21 DIAGNOSIS — M25561 Pain in right knee: Secondary | ICD-10-CM

## 2018-07-21 DIAGNOSIS — M159 Polyosteoarthritis, unspecified: Secondary | ICD-10-CM

## 2018-07-21 DIAGNOSIS — E7841 Elevated Lipoprotein(a): Secondary | ICD-10-CM | POA: Diagnosis not present

## 2018-07-21 DIAGNOSIS — G8929 Other chronic pain: Secondary | ICD-10-CM | POA: Diagnosis not present

## 2018-07-21 MED ORDER — TRAMADOL HCL 50 MG PO TABS: 50.0000 mg | ORAL_TABLET | Freq: Three times a day (TID) | ORAL | 5 refills | Status: DC | PRN

## 2018-07-22 DIAGNOSIS — G8929 Other chronic pain: Secondary | ICD-10-CM | POA: Diagnosis not present

## 2018-07-22 DIAGNOSIS — E785 Hyperlipidemia, unspecified: Secondary | ICD-10-CM | POA: Diagnosis not present

## 2018-07-22 DIAGNOSIS — S81801A Unspecified open wound, right lower leg, initial encounter: Secondary | ICD-10-CM | POA: Diagnosis not present

## 2018-07-22 DIAGNOSIS — L03115 Cellulitis of right lower limb: Secondary | ICD-10-CM | POA: Diagnosis not present

## 2018-07-22 DIAGNOSIS — M150 Primary generalized (osteo)arthritis: Secondary | ICD-10-CM | POA: Diagnosis not present

## 2018-07-22 DIAGNOSIS — L409 Psoriasis, unspecified: Secondary | ICD-10-CM | POA: Diagnosis not present

## 2018-07-22 DIAGNOSIS — F411 Generalized anxiety disorder: Secondary | ICD-10-CM | POA: Diagnosis not present

## 2018-07-22 DIAGNOSIS — F329 Major depressive disorder, single episode, unspecified: Secondary | ICD-10-CM | POA: Diagnosis not present

## 2018-07-22 DIAGNOSIS — J449 Chronic obstructive pulmonary disease, unspecified: Secondary | ICD-10-CM | POA: Diagnosis not present

## 2018-07-22 DIAGNOSIS — M170 Bilateral primary osteoarthritis of knee: Secondary | ICD-10-CM | POA: Diagnosis not present

## 2018-07-22 DIAGNOSIS — I10 Essential (primary) hypertension: Secondary | ICD-10-CM | POA: Diagnosis not present

## 2018-07-22 LAB — CBC WITH DIFFERENTIAL/PLATELET
Basophils Absolute: 0.1 10*3/uL (ref 0.0–0.2)
Basos: 1 %
EOS (ABSOLUTE): 0.9 10*3/uL — ABNORMAL HIGH (ref 0.0–0.4)
Eos: 8 %
Hematocrit: 40.5 % (ref 34.0–46.6)
Hemoglobin: 13 g/dL (ref 11.1–15.9)
Immature Grans (Abs): 0 10*3/uL (ref 0.0–0.1)
Immature Granulocytes: 0 %
Lymphocytes Absolute: 3.9 10*3/uL — ABNORMAL HIGH (ref 0.7–3.1)
Lymphs: 32 %
MCH: 30.1 pg (ref 26.6–33.0)
MCHC: 32.1 g/dL (ref 31.5–35.7)
MCV: 94 fL (ref 79–97)
Monocytes Absolute: 0.9 10*3/uL (ref 0.1–0.9)
Monocytes: 8 %
Neutrophils Absolute: 6.3 10*3/uL (ref 1.4–7.0)
Neutrophils: 51 %
Platelets: 238 10*3/uL (ref 150–450)
RBC: 4.32 x10E6/uL (ref 3.77–5.28)
RDW: 13.2 % (ref 11.7–15.4)
WBC: 12.1 10*3/uL — AB (ref 3.4–10.8)

## 2018-07-22 LAB — LIPID PANEL
Chol/HDL Ratio: 3.2 ratio (ref 0.0–4.4)
Cholesterol, Total: 125 mg/dL (ref 100–199)
HDL: 39 mg/dL — ABNORMAL LOW (ref >39)
LDL Calculated: 52 mg/dL (ref 0–99)
TRIGLYCERIDES: 172 mg/dL — AB (ref 0–149)
VLDL Cholesterol Cal: 34 mg/dL (ref 5–40)

## 2018-07-22 LAB — CMP14+EGFR
ALK PHOS: 54 IU/L (ref 39–117)
ALT: 10 IU/L (ref 0–32)
AST: 23 IU/L (ref 0–40)
Albumin/Globulin Ratio: 1.4 (ref 1.2–2.2)
Albumin: 3.8 g/dL (ref 3.7–4.7)
BUN/Creatinine Ratio: 14 (ref 12–28)
BUN: 14 mg/dL (ref 8–27)
Bilirubin Total: 0.5 mg/dL (ref 0.0–1.2)
CO2: 26 mmol/L (ref 20–29)
Calcium: 10 mg/dL (ref 8.7–10.3)
Chloride: 99 mmol/L (ref 96–106)
Creatinine, Ser: 1 mg/dL (ref 0.57–1.00)
GFR calc Af Amer: 65 mL/min/{1.73_m2} (ref >59)
GFR calc non Af Amer: 56 mL/min/{1.73_m2} — ABNORMAL LOW (ref >59)
Globulin, Total: 2.7 g/dL (ref 1.5–4.5)
Glucose: 96 mg/dL (ref 65–99)
Potassium: 4.6 mmol/L (ref 3.5–5.2)
Sodium: 140 mmol/L (ref 134–144)
Total Protein: 6.5 g/dL (ref 6.0–8.5)

## 2018-07-22 LAB — TSH: TSH: 1.95 u[IU]/mL (ref 0.450–4.500)

## 2018-07-23 DIAGNOSIS — M150 Primary generalized (osteo)arthritis: Secondary | ICD-10-CM | POA: Diagnosis not present

## 2018-07-23 DIAGNOSIS — L03115 Cellulitis of right lower limb: Secondary | ICD-10-CM | POA: Diagnosis not present

## 2018-07-24 DIAGNOSIS — M150 Primary generalized (osteo)arthritis: Secondary | ICD-10-CM | POA: Diagnosis not present

## 2018-07-24 DIAGNOSIS — M170 Bilateral primary osteoarthritis of knee: Secondary | ICD-10-CM | POA: Diagnosis not present

## 2018-07-24 DIAGNOSIS — F329 Major depressive disorder, single episode, unspecified: Secondary | ICD-10-CM | POA: Diagnosis not present

## 2018-07-24 DIAGNOSIS — S81801A Unspecified open wound, right lower leg, initial encounter: Secondary | ICD-10-CM | POA: Diagnosis not present

## 2018-07-24 DIAGNOSIS — F411 Generalized anxiety disorder: Secondary | ICD-10-CM | POA: Diagnosis not present

## 2018-07-24 DIAGNOSIS — L409 Psoriasis, unspecified: Secondary | ICD-10-CM | POA: Diagnosis not present

## 2018-07-24 DIAGNOSIS — G8929 Other chronic pain: Secondary | ICD-10-CM | POA: Diagnosis not present

## 2018-07-24 DIAGNOSIS — J449 Chronic obstructive pulmonary disease, unspecified: Secondary | ICD-10-CM | POA: Diagnosis not present

## 2018-07-24 DIAGNOSIS — L03115 Cellulitis of right lower limb: Secondary | ICD-10-CM | POA: Diagnosis not present

## 2018-07-24 DIAGNOSIS — E785 Hyperlipidemia, unspecified: Secondary | ICD-10-CM | POA: Diagnosis not present

## 2018-07-24 DIAGNOSIS — I10 Essential (primary) hypertension: Secondary | ICD-10-CM | POA: Diagnosis not present

## 2018-07-26 DIAGNOSIS — G8929 Other chronic pain: Secondary | ICD-10-CM | POA: Insufficient documentation

## 2018-07-26 DIAGNOSIS — M25561 Pain in right knee: Secondary | ICD-10-CM

## 2018-07-26 NOTE — Progress Notes (Signed)
BP 110/66   Pulse 78   Temp 99.4 F (37.4 C) (Oral)   Ht _0  (1.626 m)   Wt 180 lb 6.4 oz (81.8 kg)   BMI 30.97 kg/m    Subjective:    Patient ID: Denise Underwood, female    DOB: [birth year], 73 y.o.   MRN: [id number]  HPI: Denise Underwood is a 73 y.o. female presenting on 07/21/2018 for Hypertension (6 week follow up ); Hyperlipidemia; and Knee Pain  This patient comes in for periodic recheck on medications and conditions including arthritis of multiple joints, hypertension, obesity.  Degenerative left knee.  She is having.  We will try to help reduce her pain medication and she is taking tramadol.  Continue to monitor and perform a slow taper of her meds.  Also she does not and we are going to reduce the dose over the next 1 tablet.  We will plan to recheck in a couple months.  PAIN ASSESSMENT: Cause of pain- arthritis Right knee: Extensive osteoarthritic change, most severe laterally. Spurring in all compartments. No fracture or joint effusion.  AP left knee: Osteoarthritic change, more severe laterally. No fracture or dislocation on frontal view.  This patient returns for a 3 month recheck on narcotic use for the above named conditions  Current medications- tramadol  Medication side effects- none Any concerns- no  Pain on scale of 1-10- 8 Frequency- daily What increases pain- walking. Trying to use walker consistently What makes pain Better- rest Effects on ADL - moderate Any change in general medical condition- no  Effectiveness of current meds- good Adverse reactions form pain meds-no PMP AWARE website reviewed: Yes Any suspicious activity on PMP Aware: No MME daily dose: 25 MME LME daily dose: 2-3 LME  Contract on file Last UDS  07/24/2018  [facility] script sent or current  History of overdose or risk of abuse none   All medications are reviewed today. There are no reports of any problems with the medications. All of the medical conditions are reviewed and  updated.  Lab work is reviewed and will be ordered as medically necessary. There are no new problems reported with today's visit.   Past Medical History:  Diagnosis Date  . Anxiety   . Arthritis   . Hyperlipidemia   . Hypertension   . Psoriasis    Relevant past medical, surgical, family and social history reviewed and updated as indicated. Interim medical history since our last visit reviewed. Allergies and medications reviewed and updated. DATA REVIEWED: CHART IN EPIC  Family History reviewed for pertinent findings.  Review of Systems  Allergies as of 07/21/2018   No Known Allergies     Medication List       Accurate as of July 21, 2018 11:59 PM. Always use your most recent med list.        ALPRAZolam 0.5 MG tablet Commonly known as:  XANAX Take 1 tablet (0.5 mg total) by mouth 3 (three) times daily as needed for anxiety.   citalopram 40 MG tablet Commonly known as:  CELEXA Take 1 tablet (40 mg total) by mouth daily. For depression   clindamycin 300 MG capsule Commonly known as:  Cleocin Take 1 capsule (300 mg total) by mouth 3 (three) times daily.   Clobetasol Prop Emollient Base 0.05 % emollient cream Commonly known as:  Clobetasol Propionate E Apply 1 application topically 2 (two) times daily. Apply to ECZEMA areas   Diclofenac Sodium 3 % Gel Place 4 g  onto the skin 4 (four) times daily as needed.   Fish Oil 1000 MG Caps Take by mouth.   fluconazole 150 MG tablet Commonly known as:  Diflucan 1 po q week x 4 weeks   hydrochlorothiazide 25 MG tablet Commonly known as:  HYDRODIURIL Take 1 tablet (25 mg total) by mouth daily. As needed for fluid   nystatin powder Commonly known as:  MYCOSTATIN/NYSTOP Apply topically 4 (four) times daily.   Risa-Bid Probiotic Tabs Take 1 tablet by mouth twice a day   rosuvastatin 20 MG tablet Commonly known as:  CRESTOR TAKE 1 TABLET BY MOUTH ONCE DAILY.   traMADol 50 MG tablet Commonly known as:  ULTRAM Take  1-2 tablets (50-100 mg total) by mouth 3 (three) times daily as needed for severe pain. for pain   Vitamin D3 25 MCG (1000 UT) Caps Take by mouth.          Objective:    BP 110/66   Pulse 78   Temp 99.4 F (37.4 C) (Oral)   Ht _0  (1.626 m)   Wt 180 lb 6.4 oz (81.8 kg)   BMI 30.97 kg/m   No Known Allergies  Wt Readings from Last 3 Encounters:  07/21/18 180 lb 6.4 oz (81.8 kg)  06/12/18 189 lb 3.2 oz (85.8 kg)  05/20/18 190 lb 6.4 oz (86.4 kg)    Physical Exam  Results for orders placed or performed in visit on 07/21/18  CBC with Differential/Platelet  Result Value Ref Range   WBC 12.1 (H) 3.4 - 10.8 x10E3/uL   RBC 4.32 3.77 - 5.28 x10E6/uL   Hemoglobin 13.0 11.1 - 15.9 g/dL   Hematocrit 40.5 34.0 - 46.6 %   MCV 94 79 - 97 fL   MCH 30.1 26.6 - 33.0 pg   MCHC 32.1 31.5 - 35.7 g/dL   RDW 13.2 11.7 - 15.4 %   Platelets 238 150 - 450 x10E3/uL   Neutrophils 51 Not Estab. %   Lymphs 32 Not Estab. %   Monocytes 8 Not Estab. %   Eos 8 Not Estab. %   Basos 1 Not Estab. %   Neutrophils Absolute 6.3 1.4 - 7.0 x10E3/uL   Lymphocytes Absolute 3.9 (H) 0.7 - 3.1 x10E3/uL   Monocytes Absolute 0.9 0.1 - 0.9 x10E3/uL   EOS (ABSOLUTE) 0.9 (H) 0.0 - 0.4 x10E3/uL   Basophils Absolute 0.1 0.0 - 0.2 x10E3/uL   Immature Granulocytes 0 Not Estab. %   Immature Grans (Abs) 0.0 0.0 - 0.1 x10E3/uL  CMP14+EGFR  Result Value Ref Range   Glucose 96 65 - 99 mg/dL   BUN 14 8 - 27 mg/dL   Creatinine, Ser 1.00 0.57 - 1.00 mg/dL   GFR calc non Af Amer 56 (L) >59 mL/min/1.73   GFR calc Af Amer 65 >59 mL/min/1.73   BUN/Creatinine Ratio 14 12 - 28   Sodium 140 134 - 144 mmol/L   Potassium 4.6 3.5 - 5.2 mmol/L   Chloride 99 96 - 106 mmol/L   CO2 26 20 - 29 mmol/L   Calcium 10.0 8.7 - 10.3 mg/dL   Total Protein 6.5 6.0 - 8.5 g/dL   Albumin 3.8 3.7 - 4.7 g/dL   Globulin, Total 2.7 1.5 - 4.5 g/dL   Albumin/Globulin Ratio 1.4 1.2 - 2.2   Bilirubin Total 0.5 0.0 - 1.2 mg/dL   Alkaline  Phosphatase 54 39 - 117 IU/L   AST 23 0 - 40 IU/L   ALT 10 0 - 32  IU/L  Lipid panel  Result Value Ref Range   Cholesterol, Total 125 100 - 199 mg/dL   Triglycerides 172 (H) 0 - 149 mg/dL   HDL 39 (L) >39 mg/dL   VLDL Cholesterol Cal 34 5 - 40 mg/dL   LDL Calculated 52 0 - 99 mg/dL   Chol/HDL Ratio 3.2 0.0 - 4.4 ratio  TSH  Result Value Ref Range   TSH 1.950 0.450 - 4.500 uIU/mL      Assessment & Plan:   1. Primary osteoarthritis involving multiple joints - traMADol (ULTRAM) 50 MG tablet; Take 1-2 tablets (50-100 mg total) by mouth 3 (three) times daily as needed for severe pain. for pain  Dispense: 150 tablet; Refill: 5 - ToxASSURE Select 13 (MW), Urine  2. Essential hypertension - CBC with Differential/Platelet - CMP14+EGFR - Lipid panel - TSH - Microalbumin / creatinine urine ratio  3. Elevated lipoprotein(a) - CBC with Differential/Platelet - CMP14+EGFR - Lipid panel - TSH - Microalbumin / creatinine urine ratio  4. Chronic pain of right knee - traMADol (ULTRAM) 50 MG tablet; Take 1-2 tablets (50-100 mg total) by mouth 3 (three) times daily as needed for severe pain. for pain  Dispense: 150 tablet; Refill: 5  5. Chronic pain of left knee - traMADol (ULTRAM) 50 MG tablet; Take 1-2 tablets (50-100 mg total) by mouth 3 (three) times daily as needed for severe pain. for pain  Dispense: 150 tablet; Refill: 5   Continue all other maintenance medications as listed above.  Follow up plan: Return in about 3 months (around 10/21/2018) for recheck.  Educational handout given for [place] PA-C [place] [address]  [place], [place] [address] [phone]   07/26/2018, 10:28 PM

## 2018-07-27 DIAGNOSIS — L03115 Cellulitis of right lower limb: Secondary | ICD-10-CM | POA: Diagnosis not present

## 2018-07-27 DIAGNOSIS — M150 Primary generalized (osteo)arthritis: Secondary | ICD-10-CM | POA: Diagnosis not present

## 2018-07-28 DIAGNOSIS — G8929 Other chronic pain: Secondary | ICD-10-CM | POA: Diagnosis not present

## 2018-07-28 DIAGNOSIS — E785 Hyperlipidemia, unspecified: Secondary | ICD-10-CM | POA: Diagnosis not present

## 2018-07-28 DIAGNOSIS — F411 Generalized anxiety disorder: Secondary | ICD-10-CM | POA: Diagnosis not present

## 2018-07-28 DIAGNOSIS — L409 Psoriasis, unspecified: Secondary | ICD-10-CM | POA: Diagnosis not present

## 2018-07-28 DIAGNOSIS — M170 Bilateral primary osteoarthritis of knee: Secondary | ICD-10-CM | POA: Diagnosis not present

## 2018-07-28 DIAGNOSIS — F329 Major depressive disorder, single episode, unspecified: Secondary | ICD-10-CM | POA: Diagnosis not present

## 2018-07-28 DIAGNOSIS — J449 Chronic obstructive pulmonary disease, unspecified: Secondary | ICD-10-CM | POA: Diagnosis not present

## 2018-07-28 DIAGNOSIS — I10 Essential (primary) hypertension: Secondary | ICD-10-CM | POA: Diagnosis not present

## 2018-07-28 DIAGNOSIS — S81801A Unspecified open wound, right lower leg, initial encounter: Secondary | ICD-10-CM | POA: Diagnosis not present

## 2018-07-29 DIAGNOSIS — M150 Primary generalized (osteo)arthritis: Secondary | ICD-10-CM | POA: Diagnosis not present

## 2018-07-29 DIAGNOSIS — L03115 Cellulitis of right lower limb: Secondary | ICD-10-CM | POA: Diagnosis not present

## 2018-07-30 DIAGNOSIS — L03115 Cellulitis of right lower limb: Secondary | ICD-10-CM | POA: Diagnosis not present

## 2018-07-30 DIAGNOSIS — M150 Primary generalized (osteo)arthritis: Secondary | ICD-10-CM | POA: Diagnosis not present

## 2018-07-31 ENCOUNTER — Ambulatory Visit: Payer: Medicare HMO | Admitting: Family Medicine

## 2018-07-31 DIAGNOSIS — M150 Primary generalized (osteo)arthritis: Secondary | ICD-10-CM | POA: Diagnosis not present

## 2018-07-31 DIAGNOSIS — F411 Generalized anxiety disorder: Secondary | ICD-10-CM | POA: Diagnosis not present

## 2018-07-31 DIAGNOSIS — M170 Bilateral primary osteoarthritis of knee: Secondary | ICD-10-CM | POA: Diagnosis not present

## 2018-07-31 DIAGNOSIS — E785 Hyperlipidemia, unspecified: Secondary | ICD-10-CM | POA: Diagnosis not present

## 2018-07-31 DIAGNOSIS — J449 Chronic obstructive pulmonary disease, unspecified: Secondary | ICD-10-CM | POA: Diagnosis not present

## 2018-07-31 DIAGNOSIS — G8929 Other chronic pain: Secondary | ICD-10-CM | POA: Diagnosis not present

## 2018-07-31 DIAGNOSIS — L409 Psoriasis, unspecified: Secondary | ICD-10-CM | POA: Diagnosis not present

## 2018-07-31 DIAGNOSIS — F329 Major depressive disorder, single episode, unspecified: Secondary | ICD-10-CM | POA: Diagnosis not present

## 2018-07-31 DIAGNOSIS — I10 Essential (primary) hypertension: Secondary | ICD-10-CM | POA: Diagnosis not present

## 2018-07-31 DIAGNOSIS — L03115 Cellulitis of right lower limb: Secondary | ICD-10-CM | POA: Diagnosis not present

## 2018-07-31 DIAGNOSIS — S81801A Unspecified open wound, right lower leg, initial encounter: Secondary | ICD-10-CM | POA: Diagnosis not present

## 2018-08-04 DIAGNOSIS — J449 Chronic obstructive pulmonary disease, unspecified: Secondary | ICD-10-CM | POA: Diagnosis not present

## 2018-08-04 DIAGNOSIS — S81801A Unspecified open wound, right lower leg, initial encounter: Secondary | ICD-10-CM | POA: Diagnosis not present

## 2018-08-04 DIAGNOSIS — L409 Psoriasis, unspecified: Secondary | ICD-10-CM | POA: Diagnosis not present

## 2018-08-04 DIAGNOSIS — G8929 Other chronic pain: Secondary | ICD-10-CM | POA: Diagnosis not present

## 2018-08-04 DIAGNOSIS — F329 Major depressive disorder, single episode, unspecified: Secondary | ICD-10-CM | POA: Diagnosis not present

## 2018-08-04 DIAGNOSIS — M170 Bilateral primary osteoarthritis of knee: Secondary | ICD-10-CM | POA: Diagnosis not present

## 2018-08-04 DIAGNOSIS — E785 Hyperlipidemia, unspecified: Secondary | ICD-10-CM | POA: Diagnosis not present

## 2018-08-04 DIAGNOSIS — I10 Essential (primary) hypertension: Secondary | ICD-10-CM | POA: Diagnosis not present

## 2018-08-04 DIAGNOSIS — F411 Generalized anxiety disorder: Secondary | ICD-10-CM | POA: Diagnosis not present

## 2018-08-05 DIAGNOSIS — T1490XA Injury, unspecified, initial encounter: Secondary | ICD-10-CM | POA: Diagnosis not present

## 2018-08-05 DIAGNOSIS — W19XXXA Unspecified fall, initial encounter: Secondary | ICD-10-CM | POA: Diagnosis not present

## 2018-08-05 DIAGNOSIS — R5381 Other malaise: Secondary | ICD-10-CM | POA: Diagnosis not present

## 2018-08-07 DIAGNOSIS — L409 Psoriasis, unspecified: Secondary | ICD-10-CM | POA: Diagnosis not present

## 2018-08-07 DIAGNOSIS — J449 Chronic obstructive pulmonary disease, unspecified: Secondary | ICD-10-CM | POA: Diagnosis not present

## 2018-08-07 DIAGNOSIS — E785 Hyperlipidemia, unspecified: Secondary | ICD-10-CM | POA: Diagnosis not present

## 2018-08-07 DIAGNOSIS — F329 Major depressive disorder, single episode, unspecified: Secondary | ICD-10-CM | POA: Diagnosis not present

## 2018-08-07 DIAGNOSIS — M170 Bilateral primary osteoarthritis of knee: Secondary | ICD-10-CM | POA: Diagnosis not present

## 2018-08-07 DIAGNOSIS — I10 Essential (primary) hypertension: Secondary | ICD-10-CM | POA: Diagnosis not present

## 2018-08-07 DIAGNOSIS — G8929 Other chronic pain: Secondary | ICD-10-CM | POA: Diagnosis not present

## 2018-08-07 DIAGNOSIS — F411 Generalized anxiety disorder: Secondary | ICD-10-CM | POA: Diagnosis not present

## 2018-08-07 DIAGNOSIS — S81801A Unspecified open wound, right lower leg, initial encounter: Secondary | ICD-10-CM | POA: Diagnosis not present

## 2018-08-11 DIAGNOSIS — M170 Bilateral primary osteoarthritis of knee: Secondary | ICD-10-CM | POA: Diagnosis not present

## 2018-08-11 DIAGNOSIS — E785 Hyperlipidemia, unspecified: Secondary | ICD-10-CM | POA: Diagnosis not present

## 2018-08-11 DIAGNOSIS — G8929 Other chronic pain: Secondary | ICD-10-CM | POA: Diagnosis not present

## 2018-08-11 DIAGNOSIS — F329 Major depressive disorder, single episode, unspecified: Secondary | ICD-10-CM | POA: Diagnosis not present

## 2018-08-11 DIAGNOSIS — S81801A Unspecified open wound, right lower leg, initial encounter: Secondary | ICD-10-CM | POA: Diagnosis not present

## 2018-08-11 DIAGNOSIS — J449 Chronic obstructive pulmonary disease, unspecified: Secondary | ICD-10-CM | POA: Diagnosis not present

## 2018-08-11 DIAGNOSIS — L409 Psoriasis, unspecified: Secondary | ICD-10-CM | POA: Diagnosis not present

## 2018-08-11 DIAGNOSIS — I10 Essential (primary) hypertension: Secondary | ICD-10-CM | POA: Diagnosis not present

## 2018-08-11 DIAGNOSIS — F411 Generalized anxiety disorder: Secondary | ICD-10-CM | POA: Diagnosis not present

## 2018-08-12 ENCOUNTER — Other Ambulatory Visit: Payer: Self-pay | Admitting: Physician Assistant

## 2018-08-12 DIAGNOSIS — L97311 Non-pressure chronic ulcer of right ankle limited to breakdown of skin: Secondary | ICD-10-CM

## 2018-08-12 DIAGNOSIS — L03115 Cellulitis of right lower limb: Secondary | ICD-10-CM

## 2018-08-12 NOTE — Telephone Encounter (Signed)
Last seen 07/21/18

## 2018-08-14 DIAGNOSIS — I10 Essential (primary) hypertension: Secondary | ICD-10-CM | POA: Diagnosis not present

## 2018-08-14 DIAGNOSIS — F329 Major depressive disorder, single episode, unspecified: Secondary | ICD-10-CM | POA: Diagnosis not present

## 2018-08-14 DIAGNOSIS — S81801A Unspecified open wound, right lower leg, initial encounter: Secondary | ICD-10-CM | POA: Diagnosis not present

## 2018-08-14 DIAGNOSIS — F411 Generalized anxiety disorder: Secondary | ICD-10-CM | POA: Diagnosis not present

## 2018-08-14 DIAGNOSIS — M170 Bilateral primary osteoarthritis of knee: Secondary | ICD-10-CM | POA: Diagnosis not present

## 2018-08-14 DIAGNOSIS — L409 Psoriasis, unspecified: Secondary | ICD-10-CM | POA: Diagnosis not present

## 2018-08-14 DIAGNOSIS — G8929 Other chronic pain: Secondary | ICD-10-CM | POA: Diagnosis not present

## 2018-08-14 DIAGNOSIS — J449 Chronic obstructive pulmonary disease, unspecified: Secondary | ICD-10-CM | POA: Diagnosis not present

## 2018-08-14 DIAGNOSIS — E785 Hyperlipidemia, unspecified: Secondary | ICD-10-CM | POA: Diagnosis not present

## 2018-08-20 DIAGNOSIS — F411 Generalized anxiety disorder: Secondary | ICD-10-CM | POA: Diagnosis not present

## 2018-08-20 DIAGNOSIS — F329 Major depressive disorder, single episode, unspecified: Secondary | ICD-10-CM | POA: Diagnosis not present

## 2018-08-20 DIAGNOSIS — L409 Psoriasis, unspecified: Secondary | ICD-10-CM | POA: Diagnosis not present

## 2018-08-20 DIAGNOSIS — G8929 Other chronic pain: Secondary | ICD-10-CM | POA: Diagnosis not present

## 2018-08-20 DIAGNOSIS — M170 Bilateral primary osteoarthritis of knee: Secondary | ICD-10-CM | POA: Diagnosis not present

## 2018-08-20 DIAGNOSIS — I10 Essential (primary) hypertension: Secondary | ICD-10-CM | POA: Diagnosis not present

## 2018-08-20 DIAGNOSIS — J449 Chronic obstructive pulmonary disease, unspecified: Secondary | ICD-10-CM | POA: Diagnosis not present

## 2018-08-20 DIAGNOSIS — S81801A Unspecified open wound, right lower leg, initial encounter: Secondary | ICD-10-CM | POA: Diagnosis not present

## 2018-08-20 DIAGNOSIS — E785 Hyperlipidemia, unspecified: Secondary | ICD-10-CM | POA: Diagnosis not present

## 2018-08-25 DIAGNOSIS — I10 Essential (primary) hypertension: Secondary | ICD-10-CM | POA: Diagnosis not present

## 2018-08-25 DIAGNOSIS — E785 Hyperlipidemia, unspecified: Secondary | ICD-10-CM | POA: Diagnosis not present

## 2018-08-25 DIAGNOSIS — S81801A Unspecified open wound, right lower leg, initial encounter: Secondary | ICD-10-CM | POA: Diagnosis not present

## 2018-08-25 DIAGNOSIS — J449 Chronic obstructive pulmonary disease, unspecified: Secondary | ICD-10-CM | POA: Diagnosis not present

## 2018-08-25 DIAGNOSIS — G8929 Other chronic pain: Secondary | ICD-10-CM | POA: Diagnosis not present

## 2018-08-25 DIAGNOSIS — M170 Bilateral primary osteoarthritis of knee: Secondary | ICD-10-CM | POA: Diagnosis not present

## 2018-08-25 DIAGNOSIS — F411 Generalized anxiety disorder: Secondary | ICD-10-CM | POA: Diagnosis not present

## 2018-08-25 DIAGNOSIS — L409 Psoriasis, unspecified: Secondary | ICD-10-CM | POA: Diagnosis not present

## 2018-08-25 DIAGNOSIS — F329 Major depressive disorder, single episode, unspecified: Secondary | ICD-10-CM | POA: Diagnosis not present

## 2018-09-02 DIAGNOSIS — S81801A Unspecified open wound, right lower leg, initial encounter: Secondary | ICD-10-CM | POA: Diagnosis not present

## 2018-09-02 DIAGNOSIS — F411 Generalized anxiety disorder: Secondary | ICD-10-CM | POA: Diagnosis not present

## 2018-09-02 DIAGNOSIS — I10 Essential (primary) hypertension: Secondary | ICD-10-CM | POA: Diagnosis not present

## 2018-09-02 DIAGNOSIS — E785 Hyperlipidemia, unspecified: Secondary | ICD-10-CM | POA: Diagnosis not present

## 2018-09-02 DIAGNOSIS — M170 Bilateral primary osteoarthritis of knee: Secondary | ICD-10-CM | POA: Diagnosis not present

## 2018-09-02 DIAGNOSIS — F329 Major depressive disorder, single episode, unspecified: Secondary | ICD-10-CM | POA: Diagnosis not present

## 2018-09-02 DIAGNOSIS — L409 Psoriasis, unspecified: Secondary | ICD-10-CM | POA: Diagnosis not present

## 2018-09-02 DIAGNOSIS — J449 Chronic obstructive pulmonary disease, unspecified: Secondary | ICD-10-CM | POA: Diagnosis not present

## 2018-09-02 DIAGNOSIS — G8929 Other chronic pain: Secondary | ICD-10-CM | POA: Diagnosis not present

## 2018-09-09 DIAGNOSIS — J449 Chronic obstructive pulmonary disease, unspecified: Secondary | ICD-10-CM | POA: Diagnosis not present

## 2018-09-09 DIAGNOSIS — S81801A Unspecified open wound, right lower leg, initial encounter: Secondary | ICD-10-CM | POA: Diagnosis not present

## 2018-09-09 DIAGNOSIS — F329 Major depressive disorder, single episode, unspecified: Secondary | ICD-10-CM | POA: Diagnosis not present

## 2018-09-09 DIAGNOSIS — G8929 Other chronic pain: Secondary | ICD-10-CM | POA: Diagnosis not present

## 2018-09-09 DIAGNOSIS — F411 Generalized anxiety disorder: Secondary | ICD-10-CM | POA: Diagnosis not present

## 2018-09-09 DIAGNOSIS — I10 Essential (primary) hypertension: Secondary | ICD-10-CM | POA: Diagnosis not present

## 2018-09-09 DIAGNOSIS — L409 Psoriasis, unspecified: Secondary | ICD-10-CM | POA: Diagnosis not present

## 2018-09-09 DIAGNOSIS — M170 Bilateral primary osteoarthritis of knee: Secondary | ICD-10-CM | POA: Diagnosis not present

## 2018-09-09 DIAGNOSIS — E785 Hyperlipidemia, unspecified: Secondary | ICD-10-CM | POA: Diagnosis not present

## 2018-09-17 DIAGNOSIS — E785 Hyperlipidemia, unspecified: Secondary | ICD-10-CM | POA: Diagnosis not present

## 2018-09-17 DIAGNOSIS — G8929 Other chronic pain: Secondary | ICD-10-CM | POA: Diagnosis not present

## 2018-09-17 DIAGNOSIS — F329 Major depressive disorder, single episode, unspecified: Secondary | ICD-10-CM | POA: Diagnosis not present

## 2018-09-17 DIAGNOSIS — F411 Generalized anxiety disorder: Secondary | ICD-10-CM | POA: Diagnosis not present

## 2018-09-17 DIAGNOSIS — S81801A Unspecified open wound, right lower leg, initial encounter: Secondary | ICD-10-CM | POA: Diagnosis not present

## 2018-09-17 DIAGNOSIS — J449 Chronic obstructive pulmonary disease, unspecified: Secondary | ICD-10-CM | POA: Diagnosis not present

## 2018-09-17 DIAGNOSIS — I10 Essential (primary) hypertension: Secondary | ICD-10-CM | POA: Diagnosis not present

## 2018-09-17 DIAGNOSIS — L409 Psoriasis, unspecified: Secondary | ICD-10-CM | POA: Diagnosis not present

## 2018-09-17 DIAGNOSIS — M170 Bilateral primary osteoarthritis of knee: Secondary | ICD-10-CM | POA: Diagnosis not present

## 2018-09-22 DIAGNOSIS — S81801A Unspecified open wound, right lower leg, initial encounter: Secondary | ICD-10-CM | POA: Diagnosis not present

## 2018-09-22 DIAGNOSIS — L409 Psoriasis, unspecified: Secondary | ICD-10-CM | POA: Diagnosis not present

## 2018-09-22 DIAGNOSIS — M170 Bilateral primary osteoarthritis of knee: Secondary | ICD-10-CM | POA: Diagnosis not present

## 2018-09-22 DIAGNOSIS — G8929 Other chronic pain: Secondary | ICD-10-CM | POA: Diagnosis not present

## 2018-09-22 DIAGNOSIS — J449 Chronic obstructive pulmonary disease, unspecified: Secondary | ICD-10-CM | POA: Diagnosis not present

## 2018-09-22 DIAGNOSIS — E785 Hyperlipidemia, unspecified: Secondary | ICD-10-CM | POA: Diagnosis not present

## 2018-09-22 DIAGNOSIS — I10 Essential (primary) hypertension: Secondary | ICD-10-CM | POA: Diagnosis not present

## 2018-09-22 DIAGNOSIS — F411 Generalized anxiety disorder: Secondary | ICD-10-CM | POA: Diagnosis not present

## 2018-09-22 DIAGNOSIS — F329 Major depressive disorder, single episode, unspecified: Secondary | ICD-10-CM | POA: Diagnosis not present

## 2018-09-29 ENCOUNTER — Telehealth: Payer: Self-pay | Admitting: Physician Assistant

## 2018-10-01 DIAGNOSIS — S81801A Unspecified open wound, right lower leg, initial encounter: Secondary | ICD-10-CM | POA: Diagnosis not present

## 2018-10-01 DIAGNOSIS — E785 Hyperlipidemia, unspecified: Secondary | ICD-10-CM | POA: Diagnosis not present

## 2018-10-01 DIAGNOSIS — M170 Bilateral primary osteoarthritis of knee: Secondary | ICD-10-CM | POA: Diagnosis not present

## 2018-10-01 DIAGNOSIS — F411 Generalized anxiety disorder: Secondary | ICD-10-CM | POA: Diagnosis not present

## 2018-10-01 DIAGNOSIS — F329 Major depressive disorder, single episode, unspecified: Secondary | ICD-10-CM | POA: Diagnosis not present

## 2018-10-01 DIAGNOSIS — I10 Essential (primary) hypertension: Secondary | ICD-10-CM | POA: Diagnosis not present

## 2018-10-01 DIAGNOSIS — G8929 Other chronic pain: Secondary | ICD-10-CM | POA: Diagnosis not present

## 2018-10-01 DIAGNOSIS — J449 Chronic obstructive pulmonary disease, unspecified: Secondary | ICD-10-CM | POA: Diagnosis not present

## 2018-10-01 DIAGNOSIS — L409 Psoriasis, unspecified: Secondary | ICD-10-CM | POA: Diagnosis not present

## 2018-10-02 ENCOUNTER — Ambulatory Visit (INDEPENDENT_AMBULATORY_CARE_PROVIDER_SITE_OTHER): Payer: Medicare HMO | Admitting: *Deleted

## 2018-10-02 ENCOUNTER — Other Ambulatory Visit: Payer: Self-pay

## 2018-10-02 DIAGNOSIS — Z0000 Encounter for general adult medical examination without abnormal findings: Secondary | ICD-10-CM

## 2018-10-02 NOTE — Progress Notes (Signed)
MEDICARE ANNUAL WELLNESS VISIT  10/02/2018  Telephone Visit Disclaimer This Medicare AWV was conducted by telephone due to national recommendations for restrictions regarding the COVID-19 Pandemic (e.g. social distancing).  I verified, using two identifiers, that I am speaking with Denise Underwood or their authorized healthcare agent. I discussed the limitations, risks, security, and privacy concerns of performing an evaluation and management service by telephone and the potential availability of an in-person appointment in the future. The patient expressed understanding and agreed to proceed.   Subjective:  Denise Underwood is a 73 y.o. female patient of Remus Loffler, PA-C who had a Medicare Annual Wellness Visit today via telephone. Denise Underwood is Retired and lives alone. she has 2 children. she reports that she is socially active and does interact with friends/family regularly. she is not physically active and enjoys watching TV.  Patient Care Team: Caryl Never as PCP - General (Physician Assistant) Magrinat, Valentino Hue, MD as Consulting Physician (Oncology) Toma Deiters, MD (Internal Medicine) Michae Kava, MD as Consulting Physician (Internal Medicine)  Advanced Directives 10/02/2018 05/08/2018 04/21/2018 04/16/2018 04/13/2018 04/13/2018 04/06/2018  Does Patient Have a Medical Advance Directive? No No No Yes Yes Yes Yes  Type of Advance Directive - - - (No Data) (No Data) (No Data) (No Data)  Does patient want to make changes to medical advance directive? - - No - Patient declined No - Patient declined No - Patient declined No - Patient declined No - Patient declined  Would patient like information on creating a medical advance directive? No - Patient declined - - No - Patient declined No - Patient declined No - Patient declined No - Patient declined    Hospital Utilization Over the Past 12 Months: # of hospitalizations or ER visits: 2 # of surgeries: 0  Review of Systems     Patient reports that her overall health is worse compared to last year.  Patient Reported Readings (BP, Pulse, CBG, Weight, etc) none  Review of Systems: Musculoskeletal ROS: positive for - pain in knee - both  All other systems negative.  Pain Assessment Pain : 0-10 Pain Score: 3  Pain Type: Chronic pain Pain Location: Knee Pain Orientation: Right, Left Pain Radiating Towards: not radiating Pain Descriptors / Indicators: Aching Pain Onset: Other (comment)(chronic knee pain due to her arthritis) Pain Frequency: Intermittent Pain Relieving Factors: sitting down and getting off legs  Effect of Pain on Daily Activities: can still do daily activities if just takes a little longer and she has to rest  Pain Relieving Factors: sitting down and getting off legs   Current Medications & Allergies (verified) Allergies as of 10/02/2018   No Known Allergies     Medication List       Accurate as of Oct 02, 2018  4:10 PM. If you have any questions, ask your nurse or doctor.        STOP taking these medications   clindamycin 150 MG capsule Commonly known as:  CLEOCIN   Clobetasol Prop Emollient Base 0.05 % emollient cream Commonly known as:  Clobetasol Propionate E     TAKE these medications   ALPRAZolam 0.5 MG tablet Commonly known as:  XANAX Take 1 tablet (0.5 mg total) by mouth 3 (three) times daily as needed for anxiety.   citalopram 40 MG tablet Commonly known as:  CELEXA Take 1 tablet (40 mg total) by mouth daily. For depression   Diclofenac Sodium 3 % Gel Place 4  g onto the skin 4 (four) times daily as needed.   Fish Oil 1000 MG Caps Take by mouth.   fluconazole 150 MG tablet Commonly known as:  Diflucan 1 po q week x 4 weeks   hydrochlorothiazide 25 MG tablet Commonly known as:  HYDRODIURIL Take 1 tablet (25 mg total) by mouth daily. As needed for fluid   nystatin powder Commonly known as:  MYCOSTATIN/NYSTOP Apply topically 4 (four) times daily.    Risa-Bid Probiotic Tabs Take 1 tablet by mouth twice a day   rosuvastatin 20 MG tablet Commonly known as:  CRESTOR TAKE 1 TABLET BY MOUTH ONCE DAILY.   traMADol 50 MG tablet Commonly known as:  ULTRAM Take 1-2 tablets (50-100 mg total) by mouth 3 (three) times daily as needed for severe pain. for pain   Vitamin D3 25 MCG (1000 UT) Caps Take by mouth.       History (reviewed): Past Medical History:  Diagnosis Date  . Anxiety   . Arthritis   . Hyperlipidemia   . Hypertension   . Psoriasis    Past Surgical History:  Procedure Laterality Date  . ABDOMINAL HYSTERECTOMY    . CHOLECYSTECTOMY    . HERNIA REPAIR    . KNEE SURGERY    . TUBAL LIGATION     History reviewed. No pertinent family history. Social History   Socioeconomic History  . Marital status: Single    Spouse name: Not on file  . Number of children: 2  . Years of education: Not on file  . Highest education level: 9th grade  Occupational History  . Occupation: Retired  Engineer, production  . Financial resource strain: Not hard at all  . Food insecurity:    Worry: Never true    Inability: Never true  . Transportation needs:    Medical: No    Non-medical: No  Tobacco Use  . Smoking status: Former Smoker    Types: Cigarettes    Last attempt to quit: 01/11/2018    Years since quitting: 0.7  . Smokeless tobacco: Never Used  Substance and Sexual Activity  . Alcohol use: No    Frequency: Never  . Drug use: No  . Sexual activity: Not Currently    Birth control/protection: Surgical  Lifestyle  . Physical activity:    Days per week: 0 days    Minutes per session: 0 min  . Stress: Only a little  Relationships  . Social connections:    Talks on phone: More than three times a week    Gets together: More than three times a week    Attends religious service: More than 4 times per year    Active member of club or organization: Yes    Attends meetings of clubs or organizations: More than 4 times per year     Relationship status: Divorced  Other Topics Concern  . Not on file  Social History Narrative  . Not on file    Activities of Daily Living In your present state of health, do you have any difficulty performing the following activities: 10/02/2018 03/29/2018  Hearing? N N  Vision? N N  Difficulty concentrating or making decisions? N N  Walking or climbing stairs? Y Y  Comment due to her knee pain -  Dressing or bathing? Y N  Comment she has her Aid with her so she doesn't fall when she is bathing -  Doing errands, shopping? N Y  Quarry manager and eating ? N -  Using  the Toilet? N -  In the past six months, have you accidently leaked urine? N -  Do you have problems with loss of bowel control? N -  Managing your Medications? N -  Managing your Finances? N -  Housekeeping or managing your Housekeeping? Y -  Comment her aid does the housekeeping -  Some recent data might be hidden    Patient Literacy How often do you need to have someone help you when you read instructions, pamphlets, or other written materials from your doctor or pharmacy?: 1 - Never What is the last grade level you completed in school?: 9th grade  Exercise Current Exercise Habits: The patient does not participate in regular exercise at present, Exercise limited by: orthopedic condition(s)  Diet Patient reports consuming 1 meals a day and 3 snack(s) a day Patient reports that her primary diet is: Regular Patient reports that she does have regular access to food.   Depression Screen PHQ 2/9 Scores 10/02/2018 07/21/2018 06/12/2018 05/20/2018 05/08/2018 04/24/2018 02/17/2018  PHQ - 2 Score 0 0 0 0 0 0 0     Fall Risk Fall Risk  10/02/2018 07/21/2018 06/12/2018 05/20/2018 05/08/2018  Falls in the past year? 1 1 1 1 1   Number falls in past yr: 1 1 1 1 1   Injury with Fall? 1 1 1 1  0  Comment - - - - -  Risk Factor Category  - - - - -  Risk for fall due to : History of fall(s);Impaired balance/gait - - - -  Follow up  Falls prevention discussed - - - -  Comment discussed removing all throw rugs, loose cords across the floor or in walkways, the need for adequate lighting - - - -     Objective:  Denise Underwood seemed alert and oriented and she participated appropriately during our telephone visit.  Blood Pressure Weight BMI  BP Readings from Last 3 Encounters:  07/21/18 110/66  06/12/18 121/69  05/20/18 120/76   Wt Readings from Last 3 Encounters:  07/21/18 180 lb 6.4 oz (81.8 kg)  06/12/18 189 lb 3.2 oz (85.8 kg)  05/20/18 190 lb 6.4 oz (86.4 kg)   BMI Readings from Last 1 Encounters:  07/21/18 30.97 kg/m    *Unable to obtain current vital signs, weight, and BMI due to telephone visit type  Hearing/Vision  . Kairo did not seem to have difficulty with hearing/understanding during the telephone conversation . Reports that she has not had a formal eye exam by an eye care professional within the past year . Reports that she has not had a formal hearing evaluation within the past year *Unable to fully assess hearing and vision during telephone visit type  Cognitive Function: 6CIT Screen 10/02/2018  What Year? 0 points  What month? 0 points  What time? 0 points  Count back from 20 0 points  Months in reverse 4 points  Repeat phrase 2 points  Total Score 6    Normal Cognitive Function Screening: Yes (Normal:0-7, Significant for Dysfunction: >8)  Immunization & Health Maintenance Record Immunization History  Administered Date(s) Administered  . Influenza Inj Mdck Quad Pf 02/26/2017  . Influenza, High Dose Seasonal PF 02/17/2018  . Pneumococcal Conjugate-13 05/20/2018    Health Maintenance  Topic Date Due  . Hepatitis C Screening  [birth year]  . TETANUS/TDAP  02/16/1964  . MAMMOGRAM  02/16/1995  . COLONOSCOPY  02/16/1995  . DEXA SCAN  02/15/2010  . INFLUENZA VACCINE  12/12/2018  . PNA vac  Low Risk Adult (2 of 2 - PPSV23) 05/21/2019       Assessment  This is a routine wellness  examination for Denise FullerFrances S Dupuis.  Health Maintenance: Due or Overdue Health Maintenance Due  Topic Date Due  . Hepatitis C Screening  Aug 09, 1945  . TETANUS/TDAP  02/16/1964  . MAMMOGRAM  02/16/1995  . COLONOSCOPY  02/16/1995  . DEXA SCAN  02/15/2010    Denise FullerFrances S Goel does not need a referral for Community Assistance: Care Management:   no Social Work:    no Prescription Assistance:  no Nutrition/Diabetes Education:  no   Plan:  Personalized Goals Goals Addressed            This Visit's Progress   . DIET - INCREASE LEAN PROTEINS        Personalized Health Maintenance & Screening Recommendations  TDAP, Shingles vaccines  Lung Cancer Screening Recommended: no (Low Dose CT Chest recommended if Age 66-80 years, 30 pack-year currently smoking OR have quit w/in past 15 years) Hepatitis C Screening recommended: yes-will discuss at next appt with PCP HIV Screening recommended: no  Advanced Directives: Written information was not prepared per patient's request.  Referrals & Orders No orders of the defined types were placed in this encounter.   Follow-up Plan . Follow-up with Remus LofflerJones, Angel S, PA-C as planned . Consider TDAP and Shingles Vaccines    I have personally reviewed and noted the following in the patient's chart:   . Medical and social history . Use of alcohol, tobacco or illicit drugs  . Current medications and supplements . Functional ability and status . Nutritional status . Physical activity . Advanced directives . List of other physicians . Hospitalizations, surgeries, and ER visits in previous 12 months . Vitals . Screenings to include cognitive, depression, and falls . Referrals and appointments  In addition, I have reviewed and discussed with Denise FullerFrances S Zehner certain preventive protocols, quality metrics, and best practice recommendations. A written personalized care plan for preventive services as well as general preventive health recommendations is  available and can be mailed to the patient at her request.      signature  10/02/2018

## 2018-10-02 NOTE — Patient Instructions (Signed)
Preventive Care 3 Years and Older, Female Preventive care refers to lifestyle choices and visits with your health care provider that can promote health and wellness. What does preventive care include?  A yearly physical exam. This is also called an annual well check.  Dental exams once or twice a year.  Routine eye exams. Ask your health care provider how often you should have your eyes checked.  Personal lifestyle choices, including: ? Daily care of your teeth and gums. ? Regular physical activity. ? Eating a healthy diet. ? Avoiding tobacco and drug use. ? Limiting alcohol use. ? Practicing safe sex. ? Taking low-dose aspirin every day. ? Taking vitamin and mineral supplements as recommended by your health care provider. What happens during an annual well check? The services and screenings done by your health care provider during your annual well check will depend on your age, overall health, lifestyle risk factors, and family history of disease. Counseling Your health care provider may ask you questions about your:  Alcohol use.  Tobacco use.  Drug use.  Emotional well-being.  Home and relationship well-being.  Sexual activity.  Eating habits.  History of falls.  Memory and ability to understand (cognition).  Work and work Statistician.  Reproductive health.  Screening You may have the following tests or measurements:  Height, weight, and BMI.  Blood pressure.  Lipid and cholesterol levels. These may be checked every 5 years, or more frequently if you are over 45 years old.  Skin check.  Lung cancer screening. You may have this screening every year starting at age 1 if you have a 30-pack-year history of smoking and currently smoke or have quit within the past 15 years.  Colorectal cancer screening. All adults should have this screening starting at age 52 and continuing until age 48. You will have tests every 1-10 years, depending on your results and the  type of screening test. People at increased risk should start screening at an earlier age. Screening tests may include: ? Guaiac-based fecal occult blood testing. ? Fecal immunochemical test (FIT). ? Stool DNA test. ? Virtual colonoscopy. ? Sigmoidoscopy. During this test, a flexible tube with a tiny camera (sigmoidoscope) is used to examine your rectum and lower colon. The sigmoidoscope is inserted through your anus into your rectum and lower colon. ? Colonoscopy. During this test, a long, thin, flexible tube with a tiny camera (colonoscope) is used to examine your entire colon and rectum.  Hepatitis C blood test.  Hepatitis B blood test.  Sexually transmitted disease (STD) testing.  Diabetes screening. This is done by checking your blood sugar (glucose) after you have not eaten for a while (fasting). You may have this done every 1-3 years.  Bone density scan. This is done to screen for osteoporosis. You may have this done starting at age 44.  Mammogram. This may be done every 1-2 years. Talk to your health care provider about how often you should have regular mammograms. Talk with your health care provider about your test results, treatment options, and if necessary, the need for more tests. Vaccines Your health care provider may recommend certain vaccines, such as:  Influenza vaccine. This is recommended every year.  Tetanus, diphtheria, and acellular pertussis (Tdap, Td) vaccine. You may need a Td booster every 10 years.  Varicella vaccine. You may need this if you have not been vaccinated.  Zoster vaccine. You may need this after age 63.  Measles, mumps, and rubella (MMR) vaccine. You may need at least  one dose of MMR if you were born in 1957 or later. You may also need a second dose.  Pneumococcal 13-valent conjugate (PCV13) vaccine. One dose is recommended after age 24.  Pneumococcal polysaccharide (PPSV23) vaccine. One dose is recommended after age 24.  Meningococcal  vaccine. You may need this if you have certain conditions.  Hepatitis A vaccine. You may need this if you have certain conditions or if you travel or work in places where you may be exposed to hepatitis A.  Hepatitis B vaccine. You may need this if you have certain conditions or if you travel or work in places where you may be exposed to hepatitis B.  Haemophilus influenzae type b (Hib) vaccine. You may need this if you have certain conditions. Talk to your health care provider about which screenings and vaccines you need and how often you need them. This information is not intended to replace advice given to you by your health care provider. Make sure you discuss any questions you have with your health care provider. Document Released: 05/26/2015 Document Revised: 06/19/2017 Document Reviewed: 02/28/2015 Elsevier Interactive Patient Education  2019 [employer].

## 2018-10-09 DIAGNOSIS — L409 Psoriasis, unspecified: Secondary | ICD-10-CM | POA: Diagnosis not present

## 2018-10-09 DIAGNOSIS — F329 Major depressive disorder, single episode, unspecified: Secondary | ICD-10-CM | POA: Diagnosis not present

## 2018-10-09 DIAGNOSIS — J449 Chronic obstructive pulmonary disease, unspecified: Secondary | ICD-10-CM | POA: Diagnosis not present

## 2018-10-09 DIAGNOSIS — E785 Hyperlipidemia, unspecified: Secondary | ICD-10-CM | POA: Diagnosis not present

## 2018-10-09 DIAGNOSIS — G8929 Other chronic pain: Secondary | ICD-10-CM | POA: Diagnosis not present

## 2018-10-09 DIAGNOSIS — S81801A Unspecified open wound, right lower leg, initial encounter: Secondary | ICD-10-CM | POA: Diagnosis not present

## 2018-10-09 DIAGNOSIS — F411 Generalized anxiety disorder: Secondary | ICD-10-CM | POA: Diagnosis not present

## 2018-10-09 DIAGNOSIS — I10 Essential (primary) hypertension: Secondary | ICD-10-CM | POA: Diagnosis not present

## 2018-10-09 DIAGNOSIS — M170 Bilateral primary osteoarthritis of knee: Secondary | ICD-10-CM | POA: Diagnosis not present

## 2018-10-12 DIAGNOSIS — M150 Primary generalized (osteo)arthritis: Secondary | ICD-10-CM | POA: Diagnosis not present

## 2018-10-12 DIAGNOSIS — L03115 Cellulitis of right lower limb: Secondary | ICD-10-CM | POA: Diagnosis not present

## 2018-10-14 DIAGNOSIS — S81801A Unspecified open wound, right lower leg, initial encounter: Secondary | ICD-10-CM | POA: Diagnosis not present

## 2018-10-14 DIAGNOSIS — F411 Generalized anxiety disorder: Secondary | ICD-10-CM | POA: Diagnosis not present

## 2018-10-14 DIAGNOSIS — L03115 Cellulitis of right lower limb: Secondary | ICD-10-CM | POA: Diagnosis not present

## 2018-10-14 DIAGNOSIS — J449 Chronic obstructive pulmonary disease, unspecified: Secondary | ICD-10-CM | POA: Diagnosis not present

## 2018-10-14 DIAGNOSIS — G8929 Other chronic pain: Secondary | ICD-10-CM | POA: Diagnosis not present

## 2018-10-14 DIAGNOSIS — M170 Bilateral primary osteoarthritis of knee: Secondary | ICD-10-CM | POA: Diagnosis not present

## 2018-10-14 DIAGNOSIS — E785 Hyperlipidemia, unspecified: Secondary | ICD-10-CM | POA: Diagnosis not present

## 2018-10-14 DIAGNOSIS — I10 Essential (primary) hypertension: Secondary | ICD-10-CM | POA: Diagnosis not present

## 2018-10-14 DIAGNOSIS — L409 Psoriasis, unspecified: Secondary | ICD-10-CM | POA: Diagnosis not present

## 2018-10-14 DIAGNOSIS — F329 Major depressive disorder, single episode, unspecified: Secondary | ICD-10-CM | POA: Diagnosis not present

## 2018-10-14 DIAGNOSIS — M150 Primary generalized (osteo)arthritis: Secondary | ICD-10-CM | POA: Diagnosis not present

## 2018-10-15 DIAGNOSIS — L03115 Cellulitis of right lower limb: Secondary | ICD-10-CM | POA: Diagnosis not present

## 2018-10-15 DIAGNOSIS — M150 Primary generalized (osteo)arthritis: Secondary | ICD-10-CM | POA: Diagnosis not present

## 2018-10-16 DIAGNOSIS — L03115 Cellulitis of right lower limb: Secondary | ICD-10-CM | POA: Diagnosis not present

## 2018-10-16 DIAGNOSIS — M150 Primary generalized (osteo)arthritis: Secondary | ICD-10-CM | POA: Diagnosis not present

## 2018-10-19 ENCOUNTER — Telehealth: Payer: Self-pay | Admitting: Physician Assistant

## 2018-10-19 DIAGNOSIS — M150 Primary generalized (osteo)arthritis: Secondary | ICD-10-CM | POA: Diagnosis not present

## 2018-10-19 DIAGNOSIS — L03115 Cellulitis of right lower limb: Secondary | ICD-10-CM | POA: Diagnosis not present

## 2018-10-19 NOTE — Telephone Encounter (Signed)
Appt changed and daughter aware

## 2018-10-19 NOTE — Telephone Encounter (Signed)
Pt has apt on 6/10 and needs later time no earlier than 4 PM due to ride having to bring her, please call denise to get rescheduled

## 2018-10-19 NOTE — Telephone Encounter (Signed)
yes

## 2018-10-21 ENCOUNTER — Ambulatory Visit: Payer: Medicare HMO | Admitting: Physician Assistant

## 2018-10-21 DIAGNOSIS — L03115 Cellulitis of right lower limb: Secondary | ICD-10-CM | POA: Diagnosis not present

## 2018-10-21 DIAGNOSIS — M150 Primary generalized (osteo)arthritis: Secondary | ICD-10-CM | POA: Diagnosis not present

## 2018-10-22 ENCOUNTER — Other Ambulatory Visit: Payer: Self-pay

## 2018-10-22 DIAGNOSIS — L03115 Cellulitis of right lower limb: Secondary | ICD-10-CM | POA: Diagnosis not present

## 2018-10-22 DIAGNOSIS — M150 Primary generalized (osteo)arthritis: Secondary | ICD-10-CM | POA: Diagnosis not present

## 2018-10-23 ENCOUNTER — Encounter: Payer: Self-pay | Admitting: Physician Assistant

## 2018-10-23 ENCOUNTER — Ambulatory Visit (INDEPENDENT_AMBULATORY_CARE_PROVIDER_SITE_OTHER): Payer: Medicare HMO | Admitting: Physician Assistant

## 2018-10-23 DIAGNOSIS — F411 Generalized anxiety disorder: Secondary | ICD-10-CM | POA: Diagnosis not present

## 2018-10-23 DIAGNOSIS — M159 Polyosteoarthritis, unspecified: Secondary | ICD-10-CM

## 2018-10-23 DIAGNOSIS — M150 Primary generalized (osteo)arthritis: Secondary | ICD-10-CM

## 2018-10-23 DIAGNOSIS — L409 Psoriasis, unspecified: Secondary | ICD-10-CM

## 2018-10-23 DIAGNOSIS — L03115 Cellulitis of right lower limb: Secondary | ICD-10-CM | POA: Diagnosis not present

## 2018-10-23 DIAGNOSIS — I10 Essential (primary) hypertension: Secondary | ICD-10-CM | POA: Diagnosis not present

## 2018-10-23 MED ORDER — FLUCONAZOLE 150 MG PO TABS: ORAL_TABLET | ORAL | 5 refills | Status: DC

## 2018-10-23 MED ORDER — HYDROCHLOROTHIAZIDE 25 MG PO TABS: 25.0000 mg | ORAL_TABLET | Freq: Every day | ORAL | 3 refills | Status: DC

## 2018-10-23 MED ORDER — DICLOFENAC SODIUM 3 % TD GEL: 4.0000 g | Freq: Four times a day (QID) | TRANSDERMAL | 11 refills | Status: DC | PRN

## 2018-10-23 MED ORDER — HYDROCORTISONE 2.5 % EX CREA: TOPICAL_CREAM | Freq: Two times a day (BID) | CUTANEOUS | 2 refills | Status: DC

## 2018-10-23 MED ORDER — DICLOFENAC SODIUM 1 % TD GEL: 4.0000 g | Freq: Four times a day (QID) | TRANSDERMAL | 11 refills | Status: DC

## 2018-10-23 MED ORDER — ALPRAZOLAM 0.5 MG PO TABS: 0.5000 mg | ORAL_TABLET | Freq: Two times a day (BID) | ORAL | 5 refills | Status: DC | PRN

## 2018-10-23 MED ORDER — CITALOPRAM HYDROBROMIDE 40 MG PO TABS: 40.0000 mg | ORAL_TABLET | Freq: Every day | ORAL | 3 refills | Status: DC

## 2018-10-23 NOTE — Progress Notes (Signed)
Telephone visit  Subjective: ZO:XWRU arthritis PCP: Terald Sleeper, PA-C EAV:[id number] Denise Underwood is a 73 y.o. female calls for telephone consult today. Patient provides verbal consent for consult held via phone.  Patient is identified with 2 separate identifiers.  At this time the entire area is on COVID-19 social distancing and stay home orders are in place.  Patient is of higher risk and therefore we are performing this by a virtual method.  Location of patient: home Location of provider: [facility] Others present for call: daughter  The patient still continues with significant knee pain.  That is her most severe complaint she does want to have injections performed when possible.  She has had them done before by Dr. Lajuana Ripple here in the office.  Her arthritis was quite severe.  So we will try to get her set up with Dr. Lajuana Ripple again.  The patient is also having a lot of difficulty with chores at home.  She does have worker that comes for couple of hours per day the family would really like for a little bit longer activity.  They state that they get a little bit of the work done, her bathing, meals.  However there really is not a lot of time for her to do extra walking or exercising.  We will look into getting more hours added to her care plan.  Her vitals reported by her today were blood pressure 120/70, oxygen sat 94%, pulse of 77, temp 97.9.  She is currently getting a couple hours of help from home health agency.  An aide comes in and does does a lot of the work in her home.  She would like to try to get more hours if possible.  They are not able to do much walking or activity at this time.  PAIN ASSESSMENT: Cause of pain- arthritis  Right knee: Extensive osteoarthritic change, most severe laterally. Spurring in all compartments. No fracture or joint effusion.  AP left knee: Osteoarthritic change, more severe laterally. No fracture or dislocation on frontal view.  Current  medications- tramadol 50 mg 1-2 TID for severe pain Diclofenac gel  Medication side effects- none Any concerns- no  Pain on scale of 1-10- 8 Frequency- daily What increases pain- walking. Trying to use walker consistently What makes pain Better- rest Effects on ADL - moderate-severe Any change in general medical condition- no  Effectiveness of current meds- good Adverse reactions form pain meds-no PMP AWARE website reviewed: Yes Any suspicious activity on PMP Aware: No MME daily dose: 30 MME LME daily dose: 2-3 LME, lowering this month  Contract on file 07/24/18 Last UDS  07/24/2018  History of overdose or risk of abuse none   ANXIETY ASSESSMENT Cause of anxiety: GAD, history of panic attacks This patient returns for a  month recheck on narcotic use for the above named condition(s)  Current medications- alprazolam 0.5 mg 1 up to TID for anxiety, Lowering to BID this 3 months and then lowering the dose the next time Continue Celexa 40 mg one daily  Other medications tried: multiple SSRI Medication side effects- no Any concerns- no Any change in general medical condition- no Effectiveness of current meds- good PMP AWARE website reviewed: Yes Any suspicious activity on PMP Aware: No LME daily dose: 2  contract 07/24/18 UDS 07/24/18   ROS: Per HPI  No Known Allergies Past Medical History:  Diagnosis Date  . Anxiety   . Arthritis   . Hyperlipidemia   . Hypertension   .  Psoriasis     Current Outpatient Medications:  .  ALPRAZolam (XANAX) 0.5 MG tablet, Take 1 tablet (0.5 mg total) by mouth 2 (two) times daily as needed for anxiety., Disp: 60 tablet, Rfl: 5 .  Cholecalciferol (VITAMIN D3) 1000 units CAPS, Take by mouth., Disp: , Rfl:  .  citalopram (CELEXA) 40 MG tablet, Take 1 tablet (40 mg total) by mouth daily. For depression, Disp: 90 tablet, Rfl: 0 .  Diclofenac Sodium 3 % GEL, Place 4 g onto the skin 4 (four) times daily as needed., Disp: 400 g, Rfl: 11 .   fluconazole (DIFLUCAN) 150 MG tablet, 1 po q week x 4 weeks, Disp: 4 tablet, Rfl: 5 .  hydrochlorothiazide (HYDRODIURIL) 25 MG tablet, Take 1 tablet (25 mg total) by mouth daily. As needed for fluid, Disp: 90 tablet, Rfl: 0 .  hydrocortisone 2.5 % cream, Apply topically 2 (two) times daily., Disp: 453.6 g, Rfl: 2 .  nystatin (MYCOSTATIN/NYSTOP) powder, Apply topically 4 (four) times daily., Disp: 60 g, Rfl: 11 .  Omega-3 Fatty Acids (FISH OIL) 1000 MG CAPS, Take by mouth., Disp: , Rfl:  .  Probiotic Product (RISA-BID PROBIOTIC) TABS, Take 1 tablet by mouth twice a day, Disp: , Rfl:  .  rosuvastatin (CRESTOR) 20 MG tablet, TAKE 1 TABLET BY MOUTH ONCE DAILY., Disp: 30 tablet, Rfl: 5 .  traMADol (ULTRAM) 50 MG tablet, Take 1-2 tablets (50-100 mg total) by mouth 3 (three) times daily as needed for severe pain. for pain, Disp: 150 tablet, Rfl: 5  Assessment/ Plan: 73 y.o. female   1. Primary osteoarthritis involving multiple joints - Diclofenac Sodium 3 % GEL; Place 4 g onto the skin 4 (four) times daily as needed.  Dispense: 400 g; Refill: 11  2. Essential hypertension - hydrochlorothiazide (HYDRODIURIL) 25 MG tablet; Take 1 tablet (25 mg total) by mouth daily. As needed for fluid  Dispense: 90 tablet; Refill: 3  3. Psoriasis - hydrocortisone 2.5 % cream; Apply topically 2 (two) times daily.  Dispense: 453.6 g; Refill: 2  4. GAD (generalized anxiety disorder) - ALPRAZolam (XANAX) 0.5 MG tablet; Take 1 tablet (0.5 mg total) by mouth 2 (two) times daily as needed for anxiety.  Dispense: 60 tablet; Refill: 5 - citalopram (CELEXA) 40 MG tablet; Take 1 tablet (40 mg total) by mouth daily. For depression  Dispense: 90 tablet; Refill: 3  Current medications- alprazolam 0.5 mg 1 up to TID for anxiety, Lowering to BID this 3 months and then lowering the dose the next time   Continue all other maintenance medications as listed above.  Start time: 3:46 PM End time: 3:59 PM  Meds ordered this  encounter  Medications  . hydrocortisone 2.5 % cream    Sig: Apply topically 2 (two) times daily.    Dispense:  453.6 g    Refill:  2    Order Specific Question:   Supervising Provider    Answer:   Raliegh IpGOTTSCHALK, ASHLY M [[id number]]  . ALPRAZolam (XANAX) 0.5 MG tablet    Sig: Take 1 tablet (0.5 mg total) by mouth 2 (two) times daily as needed for anxiety.    Dispense:  60 tablet    Refill:  5    Order Specific Question:   Supervising Provider    Answer:   Raliegh IpGOTTSCHALK, ASHLY M [[id number]]    Prudy FeelerAngel Emmerson Taddei PA-C [facility] [phone]

## 2018-10-26 ENCOUNTER — Telehealth: Payer: Self-pay | Admitting: Physician Assistant

## 2018-10-26 NOTE — Telephone Encounter (Signed)
Per AJ request daughter is calling back with name of company that her mothers aid is from [facility] Address is  [address] [place]

## 2018-10-28 NOTE — Progress Notes (Signed)
We are tapering her xanax down already at this time

## 2018-10-28 NOTE — Progress Notes (Signed)
I'd be glad to see patient for knee injections.  Advise against Xanax use given use of Tramadol, age and high risk for falls secondary to her OA in the knees.  Ashly M. Lajuana Ripple, [facility]

## 2018-10-28 NOTE — Telephone Encounter (Signed)
Can you get in touch with this agency?  I would like for Denise Underwood to have extended hours of service if at all possible.  I am not certain how they get this ordered.  If they have their own forms?

## 2018-10-28 NOTE — Telephone Encounter (Signed)
Tye [place] I spoke with [facility] and they said we needed to fill out a PCS form and fax to [employer]. Do we have these

## 2018-10-29 NOTE — Telephone Encounter (Signed)
Form completed, on providers desk for signature

## 2018-11-03 NOTE — Progress Notes (Signed)
Appointment given for Dr. Lajuana Ripple

## 2018-11-04 ENCOUNTER — Telehealth: Payer: Self-pay | Admitting: Physician Assistant

## 2018-11-05 ENCOUNTER — Other Ambulatory Visit: Payer: Self-pay

## 2018-11-06 ENCOUNTER — Ambulatory Visit (INDEPENDENT_AMBULATORY_CARE_PROVIDER_SITE_OTHER): Payer: Medicare HMO | Admitting: Family Medicine

## 2018-11-06 ENCOUNTER — Encounter: Payer: Self-pay | Admitting: Family Medicine

## 2018-11-06 VITALS — BP 138/82 | HR 91 | Temp 97.9°F | Ht 64.0 in | Wt 172.0 lb

## 2018-11-06 DIAGNOSIS — M25561 Pain in right knee: Secondary | ICD-10-CM | POA: Diagnosis not present

## 2018-11-06 DIAGNOSIS — M25562 Pain in left knee: Secondary | ICD-10-CM | POA: Diagnosis not present

## 2018-11-06 DIAGNOSIS — M170 Bilateral primary osteoarthritis of knee: Secondary | ICD-10-CM | POA: Diagnosis not present

## 2018-11-06 DIAGNOSIS — G8929 Other chronic pain: Secondary | ICD-10-CM

## 2018-11-06 MED ORDER — METHYLPREDNISOLONE ACETATE 80 MG/ML IJ SUSP: 80.0000 mg | Freq: Once | INTRAMUSCULAR | Status: DC

## 2018-11-06 MED ORDER — METHYLPREDNISOLONE ACETATE 80 MG/ML IJ SUSP
40.0000 mg | Freq: Once | INTRAMUSCULAR | Status: AC
  Administered 2018-11-06: 40 mg via INTRA_ARTICULAR

## 2018-11-06 NOTE — Progress Notes (Signed)
Subjective: CC: Knee pain; joint injections PCP: Terald Sleeper, PA-C XWR:[id number] Denise Underwood is a 73 y.o. female presenting to clinic today for:  1.  Bilateral osteoarthritis of the knees Patient with longstanding history of bilateral knee pain.  She was last seen by me in May 2019 for bilateral corticosteroid injection.  She notes that the steroid injections helped her knee pain for about 6 months.  Of note she is chronically treated with topical Voltaren gel, Tylenol and tramadol by her PCP for ongoing knee pain.  She uses a walker for ambulation.  She does need assistance with getting up and around sometimes.  She does have occasional swelling of bilateral knees.  Denies any intolerance to corticosteroids, lidocaine.  No diabetes or use of anticoagulants.  ROS: Per HPI  No Known Allergies Past Medical History:  Diagnosis Date  . Anxiety   . Arthritis   . Hyperlipidemia   . Hypertension   . Psoriasis     Current Outpatient Medications:  .  ALPRAZolam (XANAX) 0.5 MG tablet, Take 1 tablet (0.5 mg total) by mouth 2 (two) times daily as needed for anxiety., Disp: 60 tablet, Rfl: 5 .  Cholecalciferol (VITAMIN D3) 1000 units CAPS, Take by mouth., Disp: , Rfl:  .  citalopram (CELEXA) 40 MG tablet, Take 1 tablet (40 mg total) by mouth daily. For depression, Disp: 90 tablet, Rfl: 3 .  diclofenac sodium (VOLTAREN) 1 % GEL, Apply 4 g topically 4 (four) times daily., Disp: 400 g, Rfl: 11 .  fluconazole (DIFLUCAN) 150 MG tablet, 1 po q week x 4 weeks, Disp: 4 tablet, Rfl: 5 .  hydrochlorothiazide (HYDRODIURIL) 25 MG tablet, Take 1 tablet (25 mg total) by mouth daily. As needed for fluid, Disp: 90 tablet, Rfl: 3 .  hydrocortisone 2.5 % cream, Apply topically 2 (two) times daily., Disp: 453.6 g, Rfl: 2 .  nystatin (MYCOSTATIN/NYSTOP) powder, Apply topically 4 (four) times daily., Disp: 60 g, Rfl: 11 .  Omega-3 Fatty Acids (FISH OIL) 1000 MG CAPS, Take by mouth., Disp: , Rfl:  .  Probiotic Product  (RISA-BID PROBIOTIC) TABS, Take 1 tablet by mouth twice a day, Disp: , Rfl:  .  rosuvastatin (CRESTOR) 20 MG tablet, TAKE 1 TABLET BY MOUTH ONCE DAILY., Disp: 30 tablet, Rfl: 5 .  traMADol (ULTRAM) 50 MG tablet, Take 1-2 tablets (50-100 mg total) by mouth 3 (three) times daily as needed for severe pain. for pain, Disp: 150 tablet, Rfl: 5 Social History   Socioeconomic History  . Marital status: Single    Spouse name: Not on file  . Number of children: 2  . Years of education: Not on file  . Highest education level: 9th grade  Occupational History  . Occupation: Retired  Scientific laboratory technician  . Financial resource strain: Not hard at all  . Food insecurity    Worry: Never true    Inability: Never true  . Transportation needs    Medical: No    Non-medical: No  Tobacco Use  . Smoking status: Former Smoker    Types: Cigarettes    Quit date: 01/11/2018    Years since quitting: 0.8  . Smokeless tobacco: Never Used  Substance and Sexual Activity  . Alcohol use: No    Frequency: Never  . Drug use: No  . Sexual activity: Not Currently    Birth control/protection: Surgical  Lifestyle  . Physical activity    Days per week: 0 days    Minutes per session: 0 min  .  Stress: Only a little  Relationships  . Social connections    Talks on phone: More than three times a week    Gets together: More than three times a week    Attends religious service: More than 4 times per year    Active member of club or organization: Yes    Attends meetings of clubs or organizations: More than 4 times per year    Relationship status: Divorced  . Intimate partner violence    Fear of current or ex partner: No    Emotionally abused: No    Physically abused: No    Forced sexual activity: No  Other Topics Concern  . Not on file  Social History Narrative  . Not on file   No family history on file.  Objective: Office vital signs reviewed. BP 138/82   Pulse 91   Temp 97.9 F (36.6 C) (Oral)   Ht 5\' 4"   (1.626 m)   Wt 172 lb (78 kg)   BMI 29.52 kg/m   Physical Examination:  General: Awake, alert, obese, No acute distress Extremities: warm, well perfused, No edema, cyanosis or clubbing; +1 pulses bilaterally MSK: antlagic, slow gait; uses rolling walker for ambulation  right knee: Arthritic changes appreciated visually.  There is no warmth, erythema.she has small joint effusion appreciated.  No ligamentous laxity.  Active range of motion is limited secondary to pain  Left knee: Arthritic changes noted but less than the right knee.  No warmth, erythema or joint effusion.  No ligamentous laxity.  Active range of motion only very slightly reduced in the left compared to the right. Skin: dry; intact; no rashes or lesions Neuro: LE light touch sensation grossly intact  JOINT INJECTION:  Patient denies allergy to antiseptics (including iodine) and anesthetics.  Patient denies h/o diabetes, frequent steroid use, use of blood thinners/ antiplatelets.  Patient was given informed consent and a signed copy has been placed in the chart. Appropriate time out was taken. Area prepped and draped in usual sterile fashion. Anatomic landmarks were identified and injection site was marked.  Ethyl chloride spray was used to numb the area and 1 cc of methylprednisolone 40 mg/ml plus  3 cc of 1% lidocaine without epinephrine was injected into the left knee using a(n) anteriorlateral approach. The patient tolerated the procedure well and there were no immediate complications. Estimated blood loss is less than 1 cc.  Post procedure instructions were reviewed with the patient and her daughter.  JOINT INJECTION:  Patient denies allergy to antiseptics (including iodine) and anesthetics.  Patient denies h/o diabetes, frequent steroid use, use of blood thinners/ antiplatelets.  Patient was given informed consent and a signed copy has been placed in the chart. Appropriate time out was taken. Area prepped and draped in  usual sterile fashion. Anatomic landmarks were identified and injection site was marked.  Ethyl chloride spray was used to numb the area and 1 cc of methylprednisolone 40 mg/ml plus  3 cc of 1% lidocaine without epinephrine was injected into the right knee using a(n) anteriormedial approach. The patient tolerated the procedure well and there were no immediate complications. Estimated blood loss is less than 1 cc.  Post procedure instructions were reviewed.  Shon HaleBrittany Joyce, FNP present during examination.   Assessment/ Plan: 73 y.o. female   1. Primary osteoarthritis of both knees Treated with corticosteroid injections bilaterally.  She tolerated procedure without difficulty.  No immediate complications.  Home care instructions reviewed and reasons for emergent valuation emergency  department discussed.  She will follow-up in 3 months or later for next corticosteroid injection if needed.  Follow-up with PCP PRN.  2. Chronic pain of right knee - methylPREDNISolone acetate (DEPO-MEDROL) injection 80 mg  3. Chronic pain of left knee - methylPREDNISolone acetate (DEPO-MEDROL) injection 80 mg  Denise IpAshly M Sherard Sutch, DO [facility] [phone]

## 2018-11-06 NOTE — Patient Instructions (Signed)
Knee Injection A knee injection is a procedure to get medicine into your knee joint to relieve the pain, swelling, and stiffness of arthritis. Your health care provider uses a needle to inject medicine, which may also help to lubricate and cushion your knee joint. You may need more than one injection. Tell a health care provider about:  Any allergies you have.  All medicines you are taking, including vitamins, herbs, eye drops, creams, and over-the-counter medicines.  Any problems you or family members have had with anesthetic medicines.  Any blood disorders you have.  Any surgeries you have had.  Any medical conditions you have.  Whether you are pregnant or may be pregnant. What are the risks? Generally, this is a safe procedure. However, problems may occur, including:  Infection.  Bleeding.  Symptoms that get worse.  Damage to the area around your knee.  Allergic reaction to any of the medicines.  Skin reactions from repeated injections. What happens before the procedure?  Ask your health care provider about changing or stopping your regular medicines. This is especially important if you are taking diabetes medicines or blood thinners.  Plan to have someone take you home from the hospital or clinic. What happens during the procedure?   You will sit or lie down in a position for your knee to be treated.  The skin over your kneecap will be cleaned with a germ-killing soap.  You will be given a medicine that numbs the area (local anesthetic). You may feel some stinging.  The medicine will be injected into your knee. The needle is carefully placed between your kneecap and your knee. The medicine is injected into the joint space.  The needle will be removed at the end of the procedure.  A bandage (dressing) may be placed over the injection site. The procedure may vary among health care providers and hospitals. What can I expect after the procedure?  Your blood  pressure, heart rate, breathing rate, and blood oxygen level will be monitored until you leave the hospital or clinic.  You may have to move your knee through its full range of motion. This helps to get all the medicine into your joint space.  You will be watched to make sure that you do not have a reaction to the injected medicine.  You may feel more pain, swelling, and warmth than you did before the injection. This reaction may last about 1-2 days. Follow these instructions at home: Medicines  Take over-the-counter and prescription medicines only as told by your doctor.  Do not drive or use heavy machinery while taking prescription pain medicine.  Do not take medicines such as aspirin and ibuprofen unless your health care provider tells you to take them. Injection site care  Follow instructions from your health care provider about: ? How to take care of your puncture site. ? When and how you should change your dressing. ? When you should remove your dressing.  Check your injection area every day for signs of infection. Check for: ? More redness, swelling, or pain after 2 days. ? Fluid or blood. ? Pus or a bad smell. ? Warmth. Managing pain, stiffness, and swelling   If directed, put ice on the injection area: ? Put ice in a plastic bag. ? Place a towel between your skin and the bag. ? Leave the ice on for 20 minutes, 2-3 times per day.  Do not apply heat to your knee.  Raise (elevate) the injection area above the level   of your heart while you are sitting or lying down. General instructions  If you were given a dressing, keep it dry until your health care provider says it can be removed. Ask your health care provider when you can start showering or taking a bath.  Avoid strenuous activities for as long as directed by your health care provider. Ask your health care provider when you can return to your normal activities.  Keep all follow-up visits as told by your health  care provider. This is important. You may need more injections. Contact a health care provider if you have:  A fever.  Warmth in your injection area.  Fluid, blood, or pus coming from your injection site.  Symptoms at your injection site that last longer than 2 days after your procedure. Get help right away if:  Your knee: ? Turns very red. ? Becomes very swollen. ? Is in severe pain. Summary  A knee injection is a procedure to get medicine into your knee joint to relieve the pain, swelling, and stiffness of arthritis.  A needle is carefully placed between your kneecap and your knee to inject medicine into the joint space.  Before the procedure, ask your health care provider about changing or stopping your regular medicines, especially if you are taking diabetes medicines or blood thinners.  Contact your health care provider if you have any problems or questions after your procedure. This information is not intended to replace advice given to you by your health care provider. Make sure you discuss any questions you have with your health care provider. Document Released: 07/21/2006 Document Revised: 05/19/2017 Document Reviewed: 05/19/2017 Elsevier Patient Education  2020 Elsevier Inc.  

## 2018-11-09 DIAGNOSIS — M150 Primary generalized (osteo)arthritis: Secondary | ICD-10-CM | POA: Diagnosis not present

## 2018-11-09 DIAGNOSIS — L03115 Cellulitis of right lower limb: Secondary | ICD-10-CM | POA: Diagnosis not present

## 2018-11-11 DIAGNOSIS — L03115 Cellulitis of right lower limb: Secondary | ICD-10-CM | POA: Diagnosis not present

## 2018-11-11 DIAGNOSIS — M150 Primary generalized (osteo)arthritis: Secondary | ICD-10-CM | POA: Diagnosis not present

## 2018-11-12 DIAGNOSIS — M150 Primary generalized (osteo)arthritis: Secondary | ICD-10-CM | POA: Diagnosis not present

## 2018-11-12 DIAGNOSIS — L03115 Cellulitis of right lower limb: Secondary | ICD-10-CM | POA: Diagnosis not present

## 2018-11-13 DIAGNOSIS — M150 Primary generalized (osteo)arthritis: Secondary | ICD-10-CM | POA: Diagnosis not present

## 2018-11-13 DIAGNOSIS — L03115 Cellulitis of right lower limb: Secondary | ICD-10-CM | POA: Diagnosis not present

## 2018-11-16 DIAGNOSIS — L03115 Cellulitis of right lower limb: Secondary | ICD-10-CM | POA: Diagnosis not present

## 2018-11-16 DIAGNOSIS — M150 Primary generalized (osteo)arthritis: Secondary | ICD-10-CM | POA: Diagnosis not present

## 2018-11-16 NOTE — Telephone Encounter (Signed)
Form faxed on 11/04/18

## 2018-11-18 DIAGNOSIS — L03115 Cellulitis of right lower limb: Secondary | ICD-10-CM | POA: Diagnosis not present

## 2018-11-18 DIAGNOSIS — M150 Primary generalized (osteo)arthritis: Secondary | ICD-10-CM | POA: Diagnosis not present

## 2018-11-20 DIAGNOSIS — M150 Primary generalized (osteo)arthritis: Secondary | ICD-10-CM | POA: Diagnosis not present

## 2018-11-20 DIAGNOSIS — L03115 Cellulitis of right lower limb: Secondary | ICD-10-CM | POA: Diagnosis not present

## 2019-01-02 ENCOUNTER — Other Ambulatory Visit: Payer: Self-pay | Admitting: Physician Assistant

## 2019-01-02 DIAGNOSIS — F411 Generalized anxiety disorder: Secondary | ICD-10-CM

## 2019-01-25 ENCOUNTER — Other Ambulatory Visit: Payer: Self-pay

## 2019-01-26 ENCOUNTER — Encounter: Payer: Self-pay | Admitting: Physician Assistant

## 2019-01-26 ENCOUNTER — Ambulatory Visit (INDEPENDENT_AMBULATORY_CARE_PROVIDER_SITE_OTHER): Payer: Medicare HMO | Admitting: Physician Assistant

## 2019-01-26 VITALS — BP 122/72 | HR 84 | Temp 98.2°F | Ht 64.0 in | Wt 173.2 lb

## 2019-01-26 DIAGNOSIS — M159 Polyosteoarthritis, unspecified: Secondary | ICD-10-CM

## 2019-01-26 DIAGNOSIS — M150 Primary generalized (osteo)arthritis: Secondary | ICD-10-CM

## 2019-01-26 DIAGNOSIS — M25561 Pain in right knee: Secondary | ICD-10-CM | POA: Diagnosis not present

## 2019-01-26 DIAGNOSIS — E7841 Elevated Lipoprotein(a): Secondary | ICD-10-CM | POA: Diagnosis not present

## 2019-01-26 DIAGNOSIS — I10 Essential (primary) hypertension: Secondary | ICD-10-CM | POA: Diagnosis not present

## 2019-01-26 DIAGNOSIS — G8929 Other chronic pain: Secondary | ICD-10-CM | POA: Diagnosis not present

## 2019-01-26 DIAGNOSIS — M25562 Pain in left knee: Secondary | ICD-10-CM

## 2019-01-26 DIAGNOSIS — L409 Psoriasis, unspecified: Secondary | ICD-10-CM

## 2019-01-26 MED ORDER — METHYLPREDNISOLONE ACETATE 80 MG/ML IJ SUSP
80.0000 mg | Freq: Once | INTRAMUSCULAR | Status: AC
  Administered 2019-01-26: 80 mg via INTRAMUSCULAR

## 2019-01-26 MED ORDER — TRAMADOL HCL 50 MG PO TABS: 50.0000 mg | ORAL_TABLET | Freq: Three times a day (TID) | ORAL | 5 refills | Status: DC | PRN

## 2019-01-26 MED ORDER — ROSUVASTATIN CALCIUM 20 MG PO TABS: 20.0000 mg | ORAL_TABLET | Freq: Every day | ORAL | 5 refills | Status: DC

## 2019-01-27 LAB — CBC WITH DIFFERENTIAL/PLATELET
Basophils Absolute: 0.1 10*3/uL (ref 0.0–0.2)
Basos: 1 %
EOS (ABSOLUTE): 0.7 10*3/uL — ABNORMAL HIGH (ref 0.0–0.4)
Eos: 5 %
Hematocrit: 40.8 % (ref 34.0–46.6)
Hemoglobin: 13.7 g/dL (ref 11.1–15.9)
Immature Grans (Abs): 0.1 10*3/uL (ref 0.0–0.1)
Immature Granulocytes: 1 %
Lymphocytes Absolute: 4.3 10*3/uL — ABNORMAL HIGH (ref 0.7–3.1)
Lymphs: 30 %
MCH: 32.5 pg (ref 26.6–33.0)
MCHC: 33.6 g/dL (ref 31.5–35.7)
MCV: 97 fL (ref 79–97)
Monocytes Absolute: 1 10*3/uL — ABNORMAL HIGH (ref 0.1–0.9)
Monocytes: 7 %
Neutrophils Absolute: 8.3 10*3/uL — ABNORMAL HIGH (ref 1.4–7.0)
Neutrophils: 56 %
Platelets: 222 10*3/uL (ref 150–450)
RBC: 4.22 x10E6/uL (ref 3.77–5.28)
RDW: 12.6 % (ref 11.7–15.4)
WBC: 14.4 10*3/uL — ABNORMAL HIGH (ref 3.4–10.8)

## 2019-01-27 LAB — CMP14+EGFR
ALT: 15 IU/L (ref 0–32)
AST: 21 IU/L (ref 0–40)
Albumin/Globulin Ratio: 1.6 (ref 1.2–2.2)
Albumin: 4.3 g/dL (ref 3.7–4.7)
Alkaline Phosphatase: 61 IU/L (ref 39–117)
BUN/Creatinine Ratio: 26 (ref 12–28)
BUN: 25 mg/dL (ref 8–27)
Bilirubin Total: 0.4 mg/dL (ref 0.0–1.2)
CO2: 25 mmol/L (ref 20–29)
Calcium: 10.1 mg/dL (ref 8.7–10.3)
Chloride: 101 mmol/L (ref 96–106)
Creatinine, Ser: 0.98 mg/dL (ref 0.57–1.00)
GFR calc Af Amer: 66 mL/min/{1.73_m2} (ref >59)
GFR calc non Af Amer: 57 mL/min/{1.73_m2} — ABNORMAL LOW (ref >59)
Globulin, Total: 2.7 g/dL (ref 1.5–4.5)
Glucose: 90 mg/dL (ref 65–99)
Potassium: 4.5 mmol/L (ref 3.5–5.2)
Sodium: 143 mmol/L (ref 134–144)
Total Protein: 7 g/dL (ref 6.0–8.5)

## 2019-01-27 LAB — LIPID PANEL
Chol/HDL Ratio: 3.1 ratio (ref 0.0–4.4)
Cholesterol, Total: 146 mg/dL (ref 100–199)
HDL: 47 mg/dL (ref >39)
LDL Chol Calc (NIH): 69 mg/dL (ref 0–99)
Triglycerides: 180 mg/dL — ABNORMAL HIGH (ref 0–149)
VLDL Cholesterol Cal: 30 mg/dL (ref 5–40)

## 2019-01-27 LAB — TSH: TSH: 1.47 u[IU]/mL (ref 0.450–4.500)

## 2019-01-28 NOTE — Progress Notes (Signed)
BP 122/72   Pulse 84   Temp 98.2 F (36.8 C) (Temporal)   Ht 5' 4"  (1.626 m)   Wt 173 lb 3.2 oz (78.6 kg)   SpO2 94%   BMI 29.73 kg/m    Subjective:    Patient ID: Denise Underwood, female    DOB: 1946/04/05, 73 y.o.   MRN: [id number]  HPI: Denise Underwood is a 73 y.o. female presenting on 01/26/2019 for Hypertension (3 month ), Hyperlipidemia, and Medical Management of Chronic Issues  This patient comes in for a follow-up on her chronic medical conditions.  She does have hypertension, hyperlipidemia.  We will have labs performed today.  She states that overall she has felt good.  Her blood pressures been good everywhere she is gone.  She does have a come for couple hours every day.  She states that the most bothersome thing is the severity of her knees.  They will are quite sore and hurting.  She does not want to do dermatology yet but it is offered.  Also she has her chronic psoriasis.  And we will plan for dermatology evaluation.  Things are doing a lot better.  But she still has a lot of area of irritation.   PAIN ASSESSMENT: Cause of pain-arthritis  Right knee: Extensive osteoarthritic change, most severe laterally. Spurring in all compartments. No fracture or joint effusion.  AP left knee: Osteoarthritic change, more severe laterally. No fracture or dislocation on frontal view.  Current medications- tramadol50 mg 1 TID for severe pain, lowered to 90 tab/month Diclofenac gel  Medication side effects-none Any concerns-no  Pain on scale of 1-10-8 Frequency-daily What increases pain-walking. Trying to use walker consistently What makes pain Better-rest Effects on ADL -moderate-severe Any change in general medical condition-no  Effectiveness of current meds-good Adverse reactions form pain meds-no PMP AWARE website reviewed:Yes Any suspicious activity on PMP Aware:No MME daily dose:30 MME  Contract on file 07/24/18 Last UDS3/13/2020   History of overdose or risk of abusenone   ANXIETY ASSESSMENT Cause of anxiety: GAD, history of panic attacks This patient returns for a  month recheck on narcotic use for the above named condition(s)  Current medications-  alprazolam 0.5 mg 1 up to BID for anxiety, Has lowered to BID since her last visit, will Continue Celexa 40 mg one daily  Other medications tried: multiple SSRI Medication side effects- no Any concerns- no Any change in general medical condition- no Effectiveness of current meds- good PMP AWARE website reviewed: Yes Any suspicious activity on PMP Aware: No LME daily dose: 2 Continuing to use 1/2 tab less until the next visit  contract 07/24/18 UDS 07/24/18   Past Medical History:  Diagnosis Date  . Anxiety   . Arthritis   . Hyperlipidemia   . Hypertension   . Psoriasis    Relevant past medical, surgical, family and social history reviewed and updated as indicated. Interim medical history since our last visit reviewed. Allergies and medications reviewed and updated. DATA REVIEWED: CHART IN EPIC  Family History reviewed for pertinent findings.  Review of Systems  Constitutional: Negative.  Negative for activity change, fatigue and fever.  HENT: Negative.   Eyes: Negative.   Respiratory: Negative.  Negative for cough.   Cardiovascular: Negative.  Negative for chest pain.  Gastrointestinal: Negative.  Negative for abdominal pain.  Endocrine: Negative.   Genitourinary: Negative.  Negative for dysuria.  Musculoskeletal: Positive for arthralgias, back pain, gait problem and joint swelling.  Skin: Positive for  color change and rash.    Allergies as of 01/26/2019   No Known Allergies     Medication List       Accurate as of January 26, 2019 11:59 PM. If you have any questions, ask your nurse or doctor.        STOP taking these medications   Fish Oil 1000 MG Caps Stopped by: Terald Sleeper, PA-C   Risa-Bid Probiotic Tabs Stopped by:  Terald Sleeper, PA-C     TAKE these medications   ALPRAZolam 0.5 MG tablet Commonly known as: XANAX Take 1 tablet (0.5 mg total) by mouth 2 (two) times daily as needed for anxiety.   citalopram 40 MG tablet Commonly known as: CELEXA TAKE 1 TABLET ONCE DAILY FOR DEPRESSION.   diclofenac sodium 1 % Gel Commonly known as: VOLTAREN Apply 4 g topically 4 (four) times daily.   fluconazole 150 MG tablet Commonly known as: Diflucan 1 po q week x 4 weeks   hydrochlorothiazide 25 MG tablet Commonly known as: HYDRODIURIL Take 1 tablet (25 mg total) by mouth daily. As needed for fluid   hydrocortisone 2.5 % cream Apply topically 2 (two) times daily.   nystatin powder Commonly known as: MYCOSTATIN/NYSTOP Apply topically 4 (four) times daily.   rosuvastatin 20 MG tablet Commonly known as: CRESTOR Take 1 tablet (20 mg total) by mouth daily.   traMADol 50 MG tablet Commonly known as: ULTRAM Take 1-2 tablets (50-100 mg total) by mouth 3 (three) times daily as needed for severe pain. for pain   Vitamin D3 25 MCG (1000 UT) Caps Take by mouth.          Objective:    BP 122/72   Pulse 84   Temp 98.2 F (36.8 C) (Temporal)   Ht 5' 4"  (1.626 m)   Wt 173 lb 3.2 oz (78.6 kg)   SpO2 94%   BMI 29.73 kg/m   No Known Allergies  Wt Readings from Last 3 Encounters:  01/26/19 173 lb 3.2 oz (78.6 kg)  11/06/18 172 lb (78 kg)  07/21/18 180 lb 6.4 oz (81.8 kg)    Physical Exam Constitutional:      General: She is not in acute distress.    Appearance: Normal appearance. She is well-developed.  HENT:     Head: Normocephalic and atraumatic.  Cardiovascular:     Rate and Rhythm: Normal rate.  Pulmonary:     Effort: Pulmonary effort is normal.  Skin:    General: Skin is warm and dry.     Findings: Erythema and rash present. Rash is scaling.       Neurological:     Mental Status: She is alert and oriented to person, place, and time.     Deep Tendon Reflexes: Reflexes are normal  and symmetric.     Results for orders placed or performed in visit on 01/26/19  CBC with Differential/Platelet  Result Value Ref Range   WBC 14.4 (H) 3.4 - 10.8 x10E3/uL   RBC 4.22 3.77 - 5.28 x10E6/uL   Hemoglobin 13.7 11.1 - 15.9 g/dL   Hematocrit 40.8 34.0 - 46.6 %   MCV 97 79 - 97 fL   MCH 32.5 26.6 - 33.0 pg   MCHC 33.6 31.5 - 35.7 g/dL   RDW 12.6 11.7 - 15.4 %   Platelets 222 150 - 450 x10E3/uL   Neutrophils 56 Not Estab. %   Lymphs 30 Not Estab. %   Monocytes 7 Not Estab. %  Eos 5 Not Estab. %   Basos 1 Not Estab. %   Neutrophils Absolute 8.3 (H) 1.4 - 7.0 x10E3/uL   Lymphocytes Absolute 4.3 (H) 0.7 - 3.1 x10E3/uL   Monocytes Absolute 1.0 (H) 0.1 - 0.9 x10E3/uL   EOS (ABSOLUTE) 0.7 (H) 0.0 - 0.4 x10E3/uL   Basophils Absolute 0.1 0.0 - 0.2 x10E3/uL   Immature Granulocytes 1 Not Estab. %   Immature Grans (Abs) 0.1 0.0 - 0.1 x10E3/uL  CMP14+EGFR  Result Value Ref Range   Glucose 90 65 - 99 mg/dL   BUN 25 8 - 27 mg/dL   Creatinine, Ser 0.98 0.57 - 1.00 mg/dL   GFR calc non Af Amer 57 (L) >59 mL/min/1.73   GFR calc Af Amer 66 >59 mL/min/1.73   BUN/Creatinine Ratio 26 12 - 28   Sodium 143 134 - 144 mmol/L   Potassium 4.5 3.5 - 5.2 mmol/L   Chloride 101 96 - 106 mmol/L   CO2 25 20 - 29 mmol/L   Calcium 10.1 8.7 - 10.3 mg/dL   Total Protein 7.0 6.0 - 8.5 g/dL   Albumin 4.3 3.7 - 4.7 g/dL   Globulin, Total 2.7 1.5 - 4.5 g/dL   Albumin/Globulin Ratio 1.6 1.2 - 2.2   Bilirubin Total 0.4 0.0 - 1.2 mg/dL   Alkaline Phosphatase 61 39 - 117 IU/L   AST 21 0 - 40 IU/L   ALT 15 0 - 32 IU/L  Lipid panel  Result Value Ref Range   Cholesterol, Total 146 100 - 199 mg/dL   Triglycerides 180 (H) 0 - 149 mg/dL   HDL 47 >39 mg/dL   VLDL Cholesterol Cal 30 5 - 40 mg/dL   LDL Chol Calc (NIH) 69 0 - 99 mg/dL   Chol/HDL Ratio 3.1 0.0 - 4.4 ratio  TSH  Result Value Ref Range   TSH 1.470 0.450 - 4.500 uIU/mL      Assessment & Plan:   1. Elevated lipoprotein(a) - rosuvastatin  (CRESTOR) 20 MG tablet; Take 1 tablet (20 mg total) by mouth daily.  Dispense: 30 tablet; Refill: 5 - CBC with Differential/Platelet - CMP14+EGFR - Lipid panel - TSH  2. Primary osteoarthritis involving multiple joints - traMADol (ULTRAM) 50 MG tablet; Take 1-2 tablets (50-100 mg total) by mouth 3 (three) times daily as needed for severe pain. for pain  Dispense: 90 tablet; Refill: 5 - methylPREDNISolone acetate (DEPO-MEDROL) injection 80 mg  3. Chronic pain of right knee - traMADol (ULTRAM) 50 MG tablet; Take 1-2 tablets (50-100 mg total) by mouth 3 (three) times daily as needed for severe pain. for pain  Dispense: 90 tablet; Refill: 5 - methylPREDNISolone acetate (DEPO-MEDROL) injection 80 mg  4. Chronic pain of left knee - traMADol (ULTRAM) 50 MG tablet; Take 1-2 tablets (50-100 mg total) by mouth 3 (three) times daily as needed for severe pain. for pain  Dispense: 90 tablet; Refill: 5  5. Essential hypertension - CBC with Differential/Platelet - CMP14+EGFR - Lipid panel - TSH  6. Psoriasis - Ambulatory referral to Dermatology    Continue all other maintenance medications as listed above.  Follow up plan: Return in about 3 months (around 04/27/2019) for recheck medications.  Educational handout given for [place] PA-C [place] [address]  [place], [place] [address] [phone]   01/28/2019, 5:22 PM

## 2019-01-29 ENCOUNTER — Other Ambulatory Visit: Payer: Self-pay | Admitting: Physician Assistant

## 2019-01-29 DIAGNOSIS — E7841 Elevated Lipoprotein(a): Secondary | ICD-10-CM

## 2019-01-29 DIAGNOSIS — M150 Primary generalized (osteo)arthritis: Secondary | ICD-10-CM

## 2019-01-29 DIAGNOSIS — M25561 Pain in right knee: Secondary | ICD-10-CM

## 2019-01-29 DIAGNOSIS — G8929 Other chronic pain: Secondary | ICD-10-CM

## 2019-01-29 DIAGNOSIS — M159 Polyosteoarthritis, unspecified: Secondary | ICD-10-CM

## 2019-02-01 NOTE — Progress Notes (Signed)
Agree with taper benzodiazepine.  Caution dose of tramadol with max dose celexa.  Increased risk of serotonin syndrome.  Would try to limit tramadol to lowest effective dose. Consider extended release if using regularly.  Ashly M. Lajuana Ripple, [facility]

## 2019-02-26 ENCOUNTER — Telehealth: Payer: Self-pay | Admitting: Physician Assistant

## 2019-03-01 ENCOUNTER — Other Ambulatory Visit: Payer: Self-pay

## 2019-03-01 ENCOUNTER — Ambulatory Visit (INDEPENDENT_AMBULATORY_CARE_PROVIDER_SITE_OTHER): Payer: Medicare HMO

## 2019-03-01 DIAGNOSIS — Z23 Encounter for immunization: Secondary | ICD-10-CM | POA: Diagnosis not present

## 2019-04-26 ENCOUNTER — Other Ambulatory Visit: Payer: Self-pay

## 2019-04-26 ENCOUNTER — Other Ambulatory Visit: Payer: Self-pay | Admitting: Physician Assistant

## 2019-04-26 DIAGNOSIS — F411 Generalized anxiety disorder: Secondary | ICD-10-CM

## 2019-04-27 ENCOUNTER — Ambulatory Visit (INDEPENDENT_AMBULATORY_CARE_PROVIDER_SITE_OTHER): Payer: Medicare HMO | Admitting: Physician Assistant

## 2019-04-27 ENCOUNTER — Encounter: Payer: Self-pay | Admitting: Physician Assistant

## 2019-04-27 DIAGNOSIS — E7841 Elevated Lipoprotein(a): Secondary | ICD-10-CM

## 2019-04-27 DIAGNOSIS — F411 Generalized anxiety disorder: Secondary | ICD-10-CM

## 2019-04-27 MED ORDER — BUSPIRONE HCL 10 MG PO TABS: 10.0000 mg | ORAL_TABLET | Freq: Two times a day (BID) | ORAL | 2 refills | Status: DC

## 2019-04-27 MED ORDER — ROSUVASTATIN CALCIUM 20 MG PO TABS: 20.0000 mg | ORAL_TABLET | Freq: Every day | ORAL | 11 refills | Status: DC

## 2019-04-27 MED ORDER — ALPRAZOLAM 0.25 MG PO TABS: 0.2500 mg | ORAL_TABLET | Freq: Two times a day (BID) | ORAL | 0 refills | Status: DC | PRN

## 2019-05-03 ENCOUNTER — Encounter: Payer: Self-pay | Admitting: Physician Assistant

## 2019-05-03 NOTE — Progress Notes (Signed)
Telephone visit  Subjective: CC: Elevated cholesterol and anxiety PCP: Terald Sleeper, PA-C [id number] LENNOX LEIKAM is a 73 y.o. female calls for telephone consult today. Patient provides verbal consent for consult held via phone.  Patient is identified with 2 separate identifiers.  At this time the entire area is on COVID-19 social distancing and stay home orders are in place.  Patient is of higher risk and therefore we are performing this by a virtual method.  Location of patient: Home Location of provider: [facility] Others present for call: No  This patient is having a phone visit due to Covid restrictions.  PAIN ASSESSMENT: Cause of pain-arthritis  Right knee: Extensive osteoarthritic change, most severe laterally. Spurring in all compartments. No fracture or joint effusion.  AP left knee: Osteoarthritic change, more severe laterally. No fracture or dislocation on frontal view.  Current medications- tramadol50 mg 1 TID for severe pain, lowered to 90 tab/month Diclofenac gel  Medication side effects-none Any concerns-no  Pain on scale of 1-10-8 Frequency-daily What increases pain-walking. Trying to use walker consistently What makes pain Better-rest Effects on ADL -moderate-severe Any change in general medical condition-no  Effectiveness of current meds-good Adverse reactions form pain meds-no PMP AWARE website reviewed:Yes Any suspicious activity on PMP Aware:No MME daily dose:30MME, stretched the last script out over 3 months, only using PRN now.   Recheck 1 month  Contract on file3/13/20 Last UDS3/13/2020  History of overdose or risk of abusenone   ANXIETY ASSESSMENT Cause of anxiety:GAD, history of panic attacks This patient returns for a month recheck on narcotic use for the above named condition(s)  Current medications- alprazolam 0.5 mg 1 up to BID for anxiety, Has lowered to BID since her  last visit, will Continue Celexa 40 mg one daily  Other medications tried:multiple SSRI Medication side effects-no Any concerns-no Any change in general medical condition-no Effectiveness of current meds-good PMP AWARE website reviewed:Yes Any suspicious activity on PMP Aware:No LME daily dose:1 and less this month  Recheck 1 month  contract 07/24/18 UDS 07/24/18   No Known Allergies Past Medical History:  Diagnosis Date  . Anxiety   . Arthritis   . Hyperlipidemia   . Hypertension   . Psoriasis     Current Outpatient Medications:  .  ALPRAZolam (XANAX) 0.25 MG tablet, Take 1 tablet (0.25 mg total) by mouth 2 (two) times daily as needed for anxiety., Disp: 60 tablet, Rfl: 0 .  busPIRone (BUSPAR) 10 MG tablet, Take 1 tablet (10 mg total) by mouth 2 (two) times daily. Anxiety, Disp: 60 tablet, Rfl: 2 .  Cholecalciferol (VITAMIN D3) 1000 units CAPS, Take by mouth., Disp: , Rfl:  .  citalopram (CELEXA) 40 MG tablet, TAKE 1 TABLET ONCE DAILY FOR DEPRESSION., Disp: 90 tablet, Rfl: 3 .  diclofenac sodium (VOLTAREN) 1 % GEL, Apply 4 g topically 4 (four) times daily., Disp: 400 g, Rfl: 11 .  fluconazole (DIFLUCAN) 150 MG tablet, TAKE 1 TABLET EVERY WEEK FOR 4 WEEKS., Disp: 4 tablet, Rfl: 0 .  hydrochlorothiazide (HYDRODIURIL) 25 MG tablet, Take 1 tablet (25 mg total) by mouth daily. As needed for fluid, Disp: 90 tablet, Rfl: 3 .  hydrocortisone 2.5 % cream, Apply topically 2 (two) times daily., Disp: 453.6 g, Rfl: 2 .  nystatin (MYCOSTATIN/NYSTOP) powder, APPLY TOPICALLY 4 TIMES DAILY., Disp: 60 g, Rfl: 5 .  rosuvastatin (CRESTOR) 20 MG tablet, Take 1 tablet (20 mg total) by mouth daily., Disp: 30 tablet, Rfl: 11 .  traMADol (ULTRAM) 50 MG tablet, Take 1-2 tablets (50-100 mg total) by mouth 3 (three) times daily as needed for severe pain. for pain, Disp: 90 tablet, Rfl: 5  Assessment/ Plan: 73 y.o. female   1. GAD (generalized anxiety disorder) - ALPRAZolam (XANAX) 0.25 MG  tablet; Take 1 tablet (0.25 mg total) by mouth 2 (two) times daily as needed for anxiety.  Dispense: 60 tablet; Refill: 0 Lowering by 1/2 tab daily this month while buspar goes up - busPIRone (BUSPAR) 10 MG tablet; Take 1 tablet (10 mg total) by mouth 2 (two) times daily. Anxiety  Dispense: 60 tablet; Refill: 2  2. Elevated lipoprotein(a) - rosuvastatin (CRESTOR) 20 MG tablet; Take 1 tablet (20 mg total) by mouth daily.  Dispense: 30 tablet; Refill: 11   Return in about 4 weeks (around 05/25/2019).  Continue all other maintenance medications as listed above.  Start time: 3:10 PM End time: 3:22 PM  Meds ordered this encounter  Medications  . ALPRAZolam (XANAX) 0.25 MG tablet    Sig: Take 1 tablet (0.25 mg total) by mouth 2 (two) times daily as needed for anxiety.    Dispense:  60 tablet    Refill:  0    Order Specific Question:   Supervising Provider    Answer:   Raliegh Ip [[id number]]  . busPIRone (BUSPAR) 10 MG tablet    Sig: Take 1 tablet (10 mg total) by mouth 2 (two) times daily. Anxiety    Dispense:  60 tablet    Refill:  2    Order Specific Question:   Supervising Provider    Answer:   Raliegh Ip [[id number]]  . rosuvastatin (CRESTOR) 20 MG tablet    Sig: Take 1 tablet (20 mg total) by mouth daily.    Dispense:  30 tablet    Refill:  11    Order Specific Question:   Supervising Provider    Answer:   Raliegh Ip [[id number]]    Prudy Feeler PA-C [facility] [phone]

## 2019-05-05 NOTE — Progress Notes (Signed)
I agree with the taper, again she is on tramadol and the benzo which is not acute interaction but if you are going to say that she is going to taper go ahead and write the prescription for 10% less or 6 pills less each month so that she will be forced to taper.  The likelihood of them tapering on their own is unlikely.  If he could go ahead and make those changes or call the pharmacy make those changes. Caryl Pina, MD [facility] 05/05/2019, 4:03 PM

## 2019-05-26 ENCOUNTER — Other Ambulatory Visit: Payer: Self-pay | Admitting: Physician Assistant

## 2019-05-26 DIAGNOSIS — F411 Generalized anxiety disorder: Secondary | ICD-10-CM

## 2019-05-27 ENCOUNTER — Other Ambulatory Visit: Payer: Self-pay | Admitting: Physician Assistant

## 2019-05-27 DIAGNOSIS — F411 Generalized anxiety disorder: Secondary | ICD-10-CM

## 2019-05-28 ENCOUNTER — Other Ambulatory Visit: Payer: Self-pay | Admitting: Physician Assistant

## 2019-05-28 ENCOUNTER — Telehealth: Payer: Self-pay | Admitting: Physician Assistant

## 2019-05-28 DIAGNOSIS — F411 Generalized anxiety disorder: Secondary | ICD-10-CM

## 2019-05-28 MED ORDER — ALPRAZOLAM 0.25 MG PO TABS: 0.2500 mg | ORAL_TABLET | Freq: Two times a day (BID) | ORAL | 0 refills | Status: DC | PRN

## 2019-05-28 NOTE — Telephone Encounter (Signed)
What is the name of the medication ? Alprazolam 0.25 Has appt 1-27 with Lawanna Kobus and needs enough called in to get her through to her appt  Have you contacted your pharmacy to request a refill? No  Which pharmacy would you like this sent to? [facility]   Patient notified that their request is being sent to the clinical staff for review and that they should receive a call once it is complete. If they do not receive a call within 24 hours they can check with their pharmacy or our office.

## 2019-05-28 NOTE — Telephone Encounter (Signed)
Sent 30 tablets for her to get through until her appointment.

## 2019-05-28 NOTE — Telephone Encounter (Signed)
Please advise LOV & RF 04/27/19

## 2019-06-09 ENCOUNTER — Encounter: Payer: Self-pay | Admitting: Physician Assistant

## 2019-06-09 ENCOUNTER — Ambulatory Visit (INDEPENDENT_AMBULATORY_CARE_PROVIDER_SITE_OTHER): Payer: Medicare HMO | Admitting: Physician Assistant

## 2019-06-09 VITALS — BP 125/82

## 2019-06-09 DIAGNOSIS — M25561 Pain in right knee: Secondary | ICD-10-CM

## 2019-06-09 DIAGNOSIS — G8929 Other chronic pain: Secondary | ICD-10-CM | POA: Diagnosis not present

## 2019-06-09 DIAGNOSIS — F411 Generalized anxiety disorder: Secondary | ICD-10-CM

## 2019-06-09 MED ORDER — ALPRAZOLAM 0.25 MG PO TABS: 0.2500 mg | ORAL_TABLET | Freq: Two times a day (BID) | ORAL | 0 refills | Status: DC | PRN

## 2019-06-09 MED ORDER — BUSPIRONE HCL 10 MG PO TABS: 10.0000 mg | ORAL_TABLET | Freq: Two times a day (BID) | ORAL | 2 refills | Status: DC

## 2019-06-09 NOTE — Progress Notes (Signed)
Telephone visit  Subjective: CC: Recheck on arthritis pain of the knee and anxiety PCP: Remus Loffler, PA-C [id number] Denise Underwood is a 74 y.o. female calls for telephone consult today. Patient provides verbal consent for consult held via phone.  Patient is identified with 2 separate identifiers.  At this time the entire area is on COVID-19 social distancing and stay home orders are in place.  Patient is of higher risk and therefore we are performing this by a virtual method.  Location of patient: home Location of provider: HOME Others present for call: no  Patient is having a 1 month recheck on her chronic medical conditions.  She was sent buspirone 10 mg 1 twice daily to the pharmacy last month but she does not have it in her medication basket.  Even her nurse that is there checked and it is not there.  So we are going to resend this.  Therefore the alprazolam 0.25 mg has not been helping much at all and she can tell a difference.  However working to try again this month for BuSpar 10 mg twice daily and recheck her in a month.  With the intention of reducing the medication altogether.  She is having to continue her tramadol at this time because her arthritis is significantly bad.  Her right knee is the worst.  She would like to have a referral to talk about possibly having surgery.  ANXIETY ASSESSMENT Cause of anxiety:GAD, history of panic attacks This patient returns for a month recheck on narcotic use for the above named condition(s)  Current medications- alprazolam 0.25 mg 1 up to BID for anxiety, Continue Celexa 40 mg one daily  Try again this month for BuSpar 10 mg 1 twice daily Prescription sent to her pharmacy  Other medications tried:multiple SSRI Medication side effects-no Any concerns-no Any change in general medical condition-no Effectiveness of current meds-good PMP AWARE website reviewed:Yes Any suspicious activity on PMP Aware:No LME daily dose:1  and less this month ROS: Per HPI  No Known Allergies Past Medical History:  Diagnosis Date  . Anxiety   . Arthritis   . Hyperlipidemia   . Hypertension   . Psoriasis     Current Outpatient Medications:  .  ALPRAZolam (XANAX) 0.25 MG tablet, Take 1 tablet (0.25 mg total) by mouth 2 (two) times daily as needed for anxiety., Disp: 30 tablet, Rfl: 0 .  busPIRone (BUSPAR) 10 MG tablet, Take 1 tablet (10 mg total) by mouth 2 (two) times daily. Anxiety, Disp: 60 tablet, Rfl: 2 .  Cholecalciferol (VITAMIN D3) 1000 units CAPS, Take by mouth., Disp: , Rfl:  .  citalopram (CELEXA) 40 MG tablet, TAKE 1 TABLET ONCE DAILY FOR DEPRESSION., Disp: 90 tablet, Rfl: 3 .  diclofenac sodium (VOLTAREN) 1 % GEL, Apply 4 g topically 4 (four) times daily., Disp: 400 g, Rfl: 11 .  fluconazole (DIFLUCAN) 150 MG tablet, TAKE 1 TABLET EVERY WEEK FOR 4 WEEKS., Disp: 4 tablet, Rfl: 0 .  hydrochlorothiazide (HYDRODIURIL) 25 MG tablet, Take 1 tablet (25 mg total) by mouth daily. As needed for fluid, Disp: 90 tablet, Rfl: 3 .  hydrocortisone 2.5 % cream, Apply topically 2 (two) times daily., Disp: 453.6 g, Rfl: 2 .  nystatin (MYCOSTATIN/NYSTOP) powder, APPLY TOPICALLY 4 TIMES DAILY., Disp: 60 g, Rfl: 5 .  rosuvastatin (CRESTOR) 20 MG tablet, Take 1 tablet (20 mg total) by mouth daily., Disp: 30 tablet, Rfl: 11 .  traMADol (ULTRAM) 50 MG tablet, Take 1-2  tablets (50-100 mg total) by mouth 3 (three) times daily as needed for severe pain. for pain, Disp: 90 tablet, Rfl: 5  Assessment/ Plan: 74 y.o. female   1. Chronic pain of right knee - Ambulatory referral to Orthopedic Surgery  2. GAD (generalized anxiety disorder) - busPIRone (BUSPAR) 10 MG tablet; Take 1 tablet (10 mg total) by mouth 2 (two) times daily. Anxiety  Dispense: 60 tablet; Refill: 2 - ALPRAZolam (XANAX) 0.25 MG tablet; Take 1 tablet (0.25 mg total) by mouth 2 (two) times daily as needed for anxiety.  Dispense: 30 tablet; Refill: 0   No follow-ups on  file.  Continue all other maintenance medications as listed above.  Start time: 11:00 AM End time: 11:15 AM  Meds ordered this encounter  Medications  . busPIRone (BUSPAR) 10 MG tablet    Sig: Take 1 tablet (10 mg total) by mouth 2 (two) times daily. Anxiety    Dispense:  60 tablet    Refill:  2    Order Specific Question:   Supervising Provider    Answer:   Janora Norlander [[id number]]  . ALPRAZolam (XANAX) 0.25 MG tablet    Sig: Take 1 tablet (0.25 mg total) by mouth 2 (two) times daily as needed for anxiety.    Dispense:  30 tablet    Refill:  0    Order Specific Question:   Supervising Provider    Answer:   Janora Norlander [[id number]]    Particia Nearing PA-C [facility] [phone]

## 2019-06-30 ENCOUNTER — Ambulatory Visit: Payer: Medicare HMO | Admitting: Orthopedic Surgery

## 2019-07-02 ENCOUNTER — Encounter: Payer: Self-pay | Admitting: Family Medicine

## 2019-07-02 ENCOUNTER — Ambulatory Visit (INDEPENDENT_AMBULATORY_CARE_PROVIDER_SITE_OTHER): Payer: Medicare HMO | Admitting: Family Medicine

## 2019-07-02 DIAGNOSIS — J069 Acute upper respiratory infection, unspecified: Secondary | ICD-10-CM

## 2019-07-02 MED ORDER — MUCINEX 600 MG PO TB12: 600.0000 mg | ORAL_TABLET | Freq: Two times a day (BID) | ORAL | 0 refills | Status: AC

## 2019-07-02 MED ORDER — AZITHROMYCIN 250 MG PO TABS: ORAL_TABLET | ORAL | 0 refills | Status: DC

## 2019-07-02 NOTE — Progress Notes (Signed)
Virtual Visit via telephone Note Due to COVID-19 pandemic this visit was conducted virtually. This visit type was conducted due to national recommendations for restrictions regarding the COVID-19 Pandemic (e.g. social distancing, sheltering in place) in an effort to limit this patient's exposure and mitigate transmission in our community. All issues noted in this document were discussed and addressed.  A physical exam was not performed with this format.   I connected with Denise Underwood on 07/02/2019 at 1200 by telephone and verified that I am speaking with the correct person using two identifiers. Denise Underwood is currently located at home and family is currently with them during visit. The provider, Monia Pouch, FNP is located in their office at time of visit.  I discussed the limitations, risks, security and privacy concerns of performing an evaluation and management service by telephone and the availability of in person appointments. I also discussed with the patient that there may be a patient responsible charge related to this service. The patient expressed understanding and agreed to proceed.  Subjective:  Patient ID: Denise Underwood, female    DOB: 23-Feb-1946, 74 y.o.   MRN: [id number]  Chief Complaint:  URI   HPI: Denise Underwood is a 74 y.o. female presenting on 07/02/2019 for URI   Pt reports over 1 week of cough, chest congestion, malaise, and chills. No recent travel or known sick exposures. She denies measured temperature but does have chills. States sputum production has increased and is yellowish-green. No decreased urine output or weakness. No confusion.   URI  This is a new problem. The current episode started 1 to 4 weeks ago. The problem has been gradually worsening. There has been no fever. Associated symptoms include congestion and coughing. Pertinent negatives include no abdominal pain, chest pain, diarrhea, dysuria, ear pain, headaches, joint pain, joint swelling,  nausea, neck pain, plugged ear sensation, rash, rhinorrhea, sinus pain, sneezing, sore throat, swollen glands, vomiting or wheezing. She has tried nothing for the symptoms.     Relevant past medical, surgical, family, and social history reviewed and updated as indicated.  Allergies and medications reviewed and updated.   Past Medical History:  Diagnosis Date  . Anxiety   . Arthritis   . Hyperlipidemia   . Hypertension   . Psoriasis     Past Surgical History:  Procedure Laterality Date  . ABDOMINAL HYSTERECTOMY    . CHOLECYSTECTOMY    . HERNIA REPAIR    . KNEE SURGERY    . TUBAL LIGATION      Social History   Socioeconomic History  . Marital status: Single    Spouse name: Not on file  . Number of children: 2  . Years of education: Not on file  . Highest education level: 9th grade  Occupational History  . Occupation: Retired  Tobacco Use  . Smoking status: Former Smoker    Types: Cigarettes    Quit date: 01/11/2018    Years since quitting: 1.4  . Smokeless tobacco: Never Used  Substance and Sexual Activity  . Alcohol use: No  . Drug use: No  . Sexual activity: Not Currently    Birth control/protection: Surgical  Other Topics Concern  . Not on file  Social History Narrative  . Not on file   Social Determinants of Health   Financial Resource Strain: Low Risk   . Difficulty of Paying Living Expenses: Not hard at all  Food Insecurity: No Food Insecurity  . Worried About [employer] of  Food in the Last Year: Never true  . Ran Out of Food in the Last Year: Never true  Transportation Needs: No Transportation Needs  . Lack of Transportation (Medical): No  . Lack of Transportation (Non-Medical): No  Physical Activity: Inactive  . Days of Exercise per Week: 0 days  . Minutes of Exercise per Session: 0 min  Stress: No Stress Concern Present  . Feeling of Stress : Only a little  Social Connections: Slightly Isolated  . Frequency of Communication with Friends and  Family: More than three times a week  . Frequency of Social Gatherings with Friends and Family: More than three times a week  . Attends Religious Services: More than 4 times per year  . Active Member of Clubs or Organizations: Yes  . Attends Banker Meetings: More than 4 times per year  . Marital Status: Divorced  Catering manager Violence: Not At Risk  . Fear of Current or Ex-Partner: No  . Emotionally Abused: No  . Physically Abused: No  . Sexually Abused: No    Outpatient Encounter Medications as of 07/02/2019  Medication Sig  . ALPRAZolam (XANAX) 0.25 MG tablet Take 1 tablet (0.25 mg total) by mouth 2 (two) times daily as needed for anxiety.  Marland Kitchen azithromycin (ZITHROMAX Z-PAK) 250 MG tablet As directed  . busPIRone (BUSPAR) 10 MG tablet Take 1 tablet (10 mg total) by mouth 2 (two) times daily. Anxiety  . Cholecalciferol (VITAMIN D3) 1000 units CAPS Take by mouth.  . citalopram (CELEXA) 40 MG tablet TAKE 1 TABLET ONCE DAILY FOR DEPRESSION.  . diclofenac sodium (VOLTAREN) 1 % GEL Apply 4 g topically 4 (four) times daily.  . diclofenac Sodium (VOLTAREN) 1 % GEL   . fluconazole (DIFLUCAN) 150 MG tablet TAKE 1 TABLET EVERY WEEK FOR 4 WEEKS.  Marland Kitchen guaiFENesin (MUCINEX) 600 MG 12 hr tablet Take 1 tablet (600 mg total) by mouth 2 (two) times daily for 10 days.  . hydrochlorothiazide (HYDRODIURIL) 25 MG tablet Take 1 tablet (25 mg total) by mouth daily. As needed for fluid  . hydrocortisone 2.5 % cream Apply topically 2 (two) times daily.  Marland Kitchen nystatin (MYCOSTATIN/NYSTOP) powder APPLY TOPICALLY 4 TIMES DAILY.  . rosuvastatin (CRESTOR) 20 MG tablet Take 1 tablet (20 mg total) by mouth daily.  . traMADol (ULTRAM) 50 MG tablet Take 1-2 tablets (50-100 mg total) by mouth 3 (three) times daily as needed for severe pain. for pain   No facility-administered encounter medications on file as of 07/02/2019.    No Known Allergies  Review of Systems  Constitutional: Positive for activity  change and fatigue. Negative for appetite change, chills, diaphoresis, fever and unexpected weight change.  HENT: Positive for congestion. Negative for ear pain, rhinorrhea, sinus pain, sneezing and sore throat.   Eyes: Negative.  Negative for photophobia and visual disturbance.  Respiratory: Positive for cough. Negative for chest tightness, shortness of breath and wheezing.   Cardiovascular: Negative for chest pain, palpitations and leg swelling.  Gastrointestinal: Negative for abdominal pain, blood in stool, constipation, diarrhea, nausea and vomiting.  Endocrine: Negative.   Genitourinary: Negative for decreased urine volume, difficulty urinating, dysuria, frequency and urgency.  Musculoskeletal: Negative for arthralgias, joint pain, myalgias and neck pain.  Skin: Negative.  Negative for rash.  Allergic/Immunologic: Negative.   Neurological: Negative for dizziness, tremors, seizures, syncope, facial asymmetry, speech difficulty, weakness, light-headedness, numbness and headaches.  Hematological: Negative.   Psychiatric/Behavioral: Negative for confusion, hallucinations, sleep disturbance and suicidal ideas.  All other  systems reviewed and are negative.        Observations/Objective: No vital signs or physical exam, this was a telephone or virtual health encounter.  Pt alert and oriented, answers all questions appropriately, and able to speak in full sentences.    Assessment and Plan: Vana was seen today for uri.  Diagnoses and all orders for this visit:  URI with cough and congestion Reported symptoms consistent with URI with cough and congestion. Concerning for bacterial cause due to length of symptoms. Will treat with below. Pt aware to report any new, worsening, or persistent symptoms. Medications as prescribed. Increase water intake. Reevaluation in 2 weeks or sooner if not improving.  -     azithromycin (ZITHROMAX Z-PAK) 250 MG tablet; As directed -     guaiFENesin  (MUCINEX) 600 MG 12 hr tablet; Take 1 tablet (600 mg total) by mouth 2 (two) times daily for 10 days.     Follow Up Instructions: Return in about 2 weeks (around 07/16/2019), or if symptoms worsen or fail to improve.    I discussed the assessment and treatment plan with the patient. The patient was provided an opportunity to ask questions and all were answered. The patient agreed with the plan and demonstrated an understanding of the instructions.   The patient was advised to call back or seek an in-person evaluation if the symptoms worsen or if the condition fails to improve as anticipated.  The above assessment and management plan was discussed with the patient. The patient verbalized understanding of and has agreed to the management plan. Patient is aware to call the clinic if they develop any new symptoms or if symptoms persist or worsen. Patient is aware when to return to the clinic for a follow-up visit. Patient educated on when it is appropriate to go to the emergency department.    I provided 15 minutes of non-face-to-face time during this encounter. The call started at 1200. The call ended at 1215. The other time was used for coordination of care.    Kari Baars, FNP-C [facility] [address] [place], [place] [address] [phone] 07/02/2019

## 2019-07-08 ENCOUNTER — Other Ambulatory Visit: Payer: Self-pay | Admitting: Physician Assistant

## 2019-07-08 DIAGNOSIS — F411 Generalized anxiety disorder: Secondary | ICD-10-CM

## 2019-07-08 NOTE — Telephone Encounter (Signed)
Last office visit 06/09/2019  Last refill 06/09/2019, #30, no refills

## 2019-07-08 NOTE — Telephone Encounter (Signed)
The patient is supposed to be coming off of the alprazolam.  Because she is taking both pain medication and anxiety medicine we have to discontinue 1 or the other.  I have been talking to the patient about this.  Is it okay if we go ahead and discontinue the alprazolam at this time with a taper off prescription?

## 2019-07-14 ENCOUNTER — Ambulatory Visit: Payer: Medicare HMO | Admitting: Orthopedic Surgery

## 2019-07-14 ENCOUNTER — Telehealth: Payer: Self-pay | Admitting: *Deleted

## 2019-07-14 NOTE — Telephone Encounter (Signed)
Patient is out of her alprazolam and tramadol. Patient states she was told to come back in two months and your note states 1 month. Patient given appt for 03/19.

## 2019-07-15 ENCOUNTER — Telehealth: Payer: Self-pay | Admitting: *Deleted

## 2019-07-15 ENCOUNTER — Other Ambulatory Visit: Payer: Self-pay | Admitting: Physician Assistant

## 2019-07-15 DIAGNOSIS — F411 Generalized anxiety disorder: Secondary | ICD-10-CM

## 2019-07-15 MED ORDER — ALPRAZOLAM 0.25 MG PO TABS: 0.2500 mg | ORAL_TABLET | Freq: Every day | ORAL | 0 refills | Status: DC | PRN

## 2019-07-15 NOTE — Telephone Encounter (Signed)
Daughter [name] needs a note stating that she takes care of her mother. Is it okay to provide?

## 2019-07-15 NOTE — Telephone Encounter (Signed)
Patient was seen on January 27th and was was alprazolam 0.25mg  # 30 no refills and tramadol on 01/26/19 with 5 refills. Patient is completely out. Patient has appointment in office on 03/19

## 2019-07-15 NOTE — Telephone Encounter (Signed)
This patient had a prescription for tramadol sent in with 5 refills on 01/26/2019.  Only 5 refills have happened since then.  She should have until 3/15 to get the final prescription. I did send in the prescription for alprazolam 1 time and January.  And we discussed that we would need to continue reducing the medication and to come completely off of it.  At this time the prescription I am sending we will have 15 tablets.  And she can take 1/2 tablet daily. in addition she never came in to get her UDS from 07/21/18 order

## 2019-07-15 NOTE — Telephone Encounter (Signed)
Letter on desk to sign.

## 2019-07-15 NOTE — Telephone Encounter (Signed)
yes

## 2019-07-15 NOTE — Telephone Encounter (Signed)
Was told to come back in 2 months.  And in house.

## 2019-07-15 NOTE — Telephone Encounter (Signed)
Daughter aware.

## 2019-07-28 ENCOUNTER — Ambulatory Visit: Payer: Medicare HMO | Admitting: Orthopedic Surgery

## 2019-07-29 ENCOUNTER — Other Ambulatory Visit: Payer: Self-pay | Admitting: *Deleted

## 2019-07-29 ENCOUNTER — Telehealth: Payer: Self-pay | Admitting: Physician Assistant

## 2019-07-29 DIAGNOSIS — M159 Polyosteoarthritis, unspecified: Secondary | ICD-10-CM

## 2019-07-29 DIAGNOSIS — G8929 Other chronic pain: Secondary | ICD-10-CM

## 2019-07-29 DIAGNOSIS — M25561 Pain in right knee: Secondary | ICD-10-CM

## 2019-07-29 NOTE — Telephone Encounter (Signed)
[name] is aware that appointment has been rescheduled to 08/05/2019 at 3:25 pm.

## 2019-07-30 ENCOUNTER — Ambulatory Visit: Payer: Medicare HMO | Admitting: Physician Assistant

## 2019-08-05 ENCOUNTER — Ambulatory Visit: Payer: Medicare HMO | Admitting: Physician Assistant

## 2019-08-18 ENCOUNTER — Ambulatory Visit: Payer: Medicare HMO | Admitting: Family Medicine

## 2019-08-19 ENCOUNTER — Encounter: Payer: Self-pay | Admitting: Physician Assistant

## 2019-08-23 ENCOUNTER — Telehealth: Payer: Self-pay | Admitting: Physician Assistant

## 2019-08-23 NOTE — Telephone Encounter (Signed)
Appointment scheduled with Deliah Boston on 08/25/2019, patient aware

## 2019-08-25 ENCOUNTER — Other Ambulatory Visit: Payer: Self-pay

## 2019-08-25 ENCOUNTER — Ambulatory Visit (INDEPENDENT_AMBULATORY_CARE_PROVIDER_SITE_OTHER): Payer: Medicare HMO | Admitting: Family Medicine

## 2019-08-25 ENCOUNTER — Encounter: Payer: Self-pay | Admitting: Family Medicine

## 2019-08-25 VITALS — BP 134/84 | HR 96 | Temp 97.7°F | Ht 64.0 in | Wt 172.8 lb

## 2019-08-25 DIAGNOSIS — G8929 Other chronic pain: Secondary | ICD-10-CM

## 2019-08-25 DIAGNOSIS — E7841 Elevated Lipoprotein(a): Secondary | ICD-10-CM

## 2019-08-25 DIAGNOSIS — Z79899 Other long term (current) drug therapy: Secondary | ICD-10-CM

## 2019-08-25 DIAGNOSIS — M8949 Other hypertrophic osteoarthropathy, multiple sites: Secondary | ICD-10-CM

## 2019-08-25 DIAGNOSIS — M159 Polyosteoarthritis, unspecified: Secondary | ICD-10-CM

## 2019-08-25 DIAGNOSIS — I10 Essential (primary) hypertension: Secondary | ICD-10-CM

## 2019-08-25 DIAGNOSIS — F411 Generalized anxiety disorder: Secondary | ICD-10-CM | POA: Diagnosis not present

## 2019-08-25 DIAGNOSIS — M25561 Pain in right knee: Secondary | ICD-10-CM

## 2019-08-25 DIAGNOSIS — M25562 Pain in left knee: Secondary | ICD-10-CM | POA: Diagnosis not present

## 2019-08-25 MED ORDER — BUSPIRONE HCL 10 MG PO TABS: 10.0000 mg | ORAL_TABLET | Freq: Two times a day (BID) | ORAL | 1 refills | Status: DC

## 2019-08-25 MED ORDER — HYDROCHLOROTHIAZIDE 25 MG PO TABS: 25.0000 mg | ORAL_TABLET | Freq: Every day | ORAL | 1 refills | Status: DC

## 2019-08-25 MED ORDER — TRAMADOL HCL 50 MG PO TABS: 50.0000 mg | ORAL_TABLET | Freq: Two times a day (BID) | ORAL | 2 refills | Status: DC | PRN

## 2019-08-25 MED ORDER — ROSUVASTATIN CALCIUM 20 MG PO TABS: 20.0000 mg | ORAL_TABLET | Freq: Every day | ORAL | 1 refills | Status: DC

## 2019-08-25 MED ORDER — CITALOPRAM HYDROBROMIDE 40 MG PO TABS: ORAL_TABLET | ORAL | 1 refills | Status: DC

## 2019-08-25 MED ORDER — DICLOFENAC SODIUM 1 % EX GEL: 2.0000 g | Freq: Four times a day (QID) | CUTANEOUS | 2 refills | Status: DC

## 2019-08-25 NOTE — Patient Instructions (Signed)
  Voltaren gel four times a day as needed to both knees.

## 2019-08-25 NOTE — Progress Notes (Signed)
Assessment & Plan:  1-3. Primary osteoarthritis involving multiple joints/Chronic pain of right knee/Chronic pain of left knee - Well controlled on current regimen. Encouraged patient to try only taking 1 tablet and see how she does. Encouraged to dry Voltaren gel QID. Advised to keep appointment with ortho later this month. Controlled substance agreement signed for Tramadol.  - traMADol (ULTRAM) 50 MG tablet; Take 1-2 tablets (50-100 mg total) by mouth 2 (two) times daily as needed for severe pain.  Dispense: 90 tablet; Refill: 2 - Compliance Drug Analysis, Ur - diclofenac Sodium (VOLTAREN) 1 % GEL; Apply 2 g topically 4 (four) times daily.  Dispense: 350 g; Refill: 2  4. Controlled substance agreement signed - Signed for Tramadol. Urine drug screen today.   5. GAD (generalized anxiety disorder) - Xanax D/C'd.  - citalopram (CELEXA) 40 MG tablet; TAKE 1 TABLET ONCE DAILY FOR DEPRESSION.  Dispense: 90 tablet; Refill: 1 - busPIRone (BUSPAR) 10 MG tablet; Take 1 tablet (10 mg total) by mouth 2 (two) times daily. Anxiety  Dispense: 180 tablet; Refill: 1  6. Essential hypertension - Well controlled on current regimen.  - hydrochlorothiazide (HYDRODIURIL) 25 MG tablet; Take 1 tablet (25 mg total) by mouth daily. As needed for fluid  Dispense: 90 tablet; Refill: 1 - BMP8+EGFR  7. Elevated lipoprotein(a) - Well controlled on current regimen.  - rosuvastatin (CRESTOR) 20 MG tablet; Take 1 tablet (20 mg total) by mouth daily.  Dispense: 90 tablet; Refill: 1   Return in about 3 months (around 11/24/2019) for follow-up of chronic medication conditions.  Hendricks Limes, MSN, APRN, FNP-C [facility]  Subjective:    Patient ID: Denise Underwood, female    DOB: 1945-06-21, 74 y.o.   MRN: [id number]  Patient Care Team: Loman Brooklyn, FNP as PCP - General (Family Medicine) Magrinat, Virgie Dad, MD as Consulting Physician (Oncology) Neale Burly, MD (Internal  Medicine) Milus Height, MD as Consulting Physician (Internal Medicine)   Chief Complaint:  Chief Complaint  Patient presents with  . Establish Care    jones pt  . Medical Management of Chronic Issues    check up of chronic medical conditions    HPI: Denise Underwood is a 74 y.o. female presenting on 08/25/2019 for Establish Care (jones pt) and Medical Management of Chronic Issues (check up of chronic medical conditions)  Patient reports she takes tramadol 100 mg twice daily for bilateral knee pain caused by osteoarthritis.  I am able to see a right knee x-ray in her chart from 2019 that showed extensive osteoarthritic change, most severe laterally.  Spurring in all compartments.  She has an appointment with an orthopedic later this month on 09/08/2019 and is hoping she may be a candidate for a knee replacement.  Patient reports she takes Xanax for anxiety.  When asked what kind of symptoms she has when she takes it she states "quivering on the inside".  She states she is taking 2/day, but she only gets 15 tablets at a time.  Her last fill was on 07/15/2019.  Depression screen [facility] 2/9 08/29/2019 01/26/2019 11/06/2018  Decreased Interest 2 0 0  Down, Depressed, Hopeless 2 0 0  PHQ - 2 Score 4 0 0  Altered sleeping 3 - 0  Tired, decreased energy 1 - 0  Change in appetite 0 - 0  Feeling bad or failure about yourself  1 - 0  Trouble concentrating 1 - 0  Moving slowly or fidgety/restless 0 -  0  Suicidal thoughts 0 - 0  PHQ-9 Score 10 - 0  Difficult doing work/chores Somewhat difficult - -   GAD 7 : Generalized Anxiety Score 08/29/2019  Nervous, Anxious, on Edge 3  Control/stop worrying 3  Worry too much - different things 3  Trouble relaxing 3  Restless 1  Easily annoyed or irritable 2  Afraid - awful might happen 3  Total GAD 7 Score 18  Anxiety Difficulty Somewhat difficult    Social history:  Relevant past medical, surgical, family and social history reviewed and updated as  indicated. Interim medical history since our last visit reviewed.  Allergies and medications reviewed and updated.  DATA REVIEWED: CHART IN EPIC  ROS: Negative unless specifically indicated above in HPI.    Current Outpatient Medications:  .  ALPRAZolam (XANAX) 0.25 MG tablet, Take 1 tablet (0.25 mg total) by mouth daily as needed for anxiety., Disp: 15 tablet, Rfl: 0 .  busPIRone (BUSPAR) 10 MG tablet, Take 1 tablet (10 mg total) by mouth 2 (two) times daily. Anxiety, Disp: 180 tablet, Rfl: 1 .  Cholecalciferol (VITAMIN D3) 1000 units CAPS, Take by mouth., Disp: , Rfl:  .  citalopram (CELEXA) 40 MG tablet, TAKE 1 TABLET ONCE DAILY FOR DEPRESSION., Disp: 90 tablet, Rfl: 1 .  hydrochlorothiazide (HYDRODIURIL) 25 MG tablet, Take 1 tablet (25 mg total) by mouth daily. As needed for fluid, Disp: 90 tablet, Rfl: 1 .  hydrocortisone 2.5 % cream, Apply topically 2 (two) times daily., Disp: 453.6 g, Rfl: 2 .  nystatin (MYCOSTATIN/NYSTOP) powder, APPLY TOPICALLY 4 TIMES DAILY., Disp: 60 g, Rfl: 5 .  rosuvastatin (CRESTOR) 20 MG tablet, Take 1 tablet (20 mg total) by mouth daily., Disp: 90 tablet, Rfl: 1 .  traMADol (ULTRAM) 50 MG tablet, Take 1-2 tablets (50-100 mg total) by mouth 2 (two) times daily as needed for severe pain., Disp: 90 tablet, Rfl: 2 .  diclofenac Sodium (VOLTAREN) 1 % GEL, Apply 2 g topically 4 (four) times daily., Disp: 350 g, Rfl: 2   No Known Allergies Past Medical History:  Diagnosis Date  . Anxiety   . Arthritis   . Hyperlipidemia   . Hypertension   . Psoriasis     Past Surgical History:  Procedure Laterality Date  . ABDOMINAL HYSTERECTOMY    . CHOLECYSTECTOMY    . HERNIA REPAIR    . KNEE SURGERY    . TUBAL LIGATION      Social History   Socioeconomic History  . Marital status: Single    Spouse name: Not on file  . Number of children: 2  . Years of education: Not on file  . Highest education level: 9th grade  Occupational History  . Occupation: Retired   Tobacco Use  . Smoking status: Former Smoker    Types: Cigarettes    Quit date: 01/11/2018    Years since quitting: 1.6  . Smokeless tobacco: Never Used  Substance and Sexual Activity  . Alcohol use: No  . Drug use: No  . Sexual activity: Not Currently    Birth control/protection: Surgical  Other Topics Concern  . Not on file  Social History Narrative  . Not on file   Social Determinants of Health   Financial Resource Strain: Low Risk   . Difficulty of Paying Living Expenses: Not hard at all  Food Insecurity: No Food Insecurity  . Worried About Charity fundraiser in the Last Year: Never true  . Ran Out of Food in the  Last Year: Never true  Transportation Needs: No Transportation Needs  . Lack of Transportation (Medical): No  . Lack of Transportation (Non-Medical): No  Physical Activity: Inactive  . Days of Exercise per Week: 0 days  . Minutes of Exercise per Session: 0 min  Stress: No Stress Concern Present  . Feeling of Stress : Only a little  Social Connections: Slightly Isolated  . Frequency of Communication with Friends and Family: More than three times a week  . Frequency of Social Gatherings with Friends and Family: More than three times a week  . Attends Religious Services: More than 4 times per year  . Active Member of Clubs or Organizations: Yes  . Attends Archivist Meetings: More than 4 times per year  . Marital Status: Divorced  Human resources officer Violence: Not At Risk  . Fear of Current or Ex-Partner: No  . Emotionally Abused: No  . Physically Abused: No  . Sexually Abused: No        Objective:    BP 134/84   Pulse 96   Temp 97.7 F (36.5 C) (Temporal)   Ht 5' 4"  (1.626 m)   Wt 172 lb 12.8 oz (78.4 kg)   SpO2 95%   BMI 29.66 kg/m   Wt Readings from Last 3 Encounters:  08/25/19 172 lb 12.8 oz (78.4 kg)  01/26/19 173 lb 3.2 oz (78.6 kg)  11/06/18 172 lb (78 kg)    Physical Exam Vitals reviewed.  Constitutional:      General: She  is not in acute distress.    Appearance: Normal appearance. She is overweight. She is not ill-appearing, toxic-appearing or diaphoretic.  HENT:     Head: Normocephalic and atraumatic.  Eyes:     General: No scleral icterus.       Right eye: No discharge.        Left eye: No discharge.     Conjunctiva/sclera: Conjunctivae normal.  Cardiovascular:     Rate and Rhythm: Normal rate and regular rhythm.     Heart sounds: Normal heart sounds. No murmur. No friction rub. No gallop.   Pulmonary:     Effort: Pulmonary effort is normal. No respiratory distress.     Breath sounds: Normal breath sounds. No stridor. No wheezing, rhonchi or rales.  Musculoskeletal:        General: Normal range of motion.     Cervical back: Normal range of motion.  Skin:    General: Skin is warm and dry.     Capillary Refill: Capillary refill takes less than 2 seconds.  Neurological:     General: No focal deficit present.     Mental Status: She is alert and oriented to person, place, and time. Mental status is at baseline.  Psychiatric:        Mood and Affect: Mood normal.        Behavior: Behavior normal.        Thought Content: Thought content normal.        Judgment: Judgment normal.     Lab Results  Component Value Date   TSH 1.470 01/26/2019   Lab Results  Component Value Date   WBC 14.4 (H) 01/26/2019   HGB 13.7 01/26/2019   HCT 40.8 01/26/2019   MCV 97 01/26/2019   PLT 222 01/26/2019   Lab Results  Component Value Date   NA 145 (H) 08/25/2019   K 3.3 (L) 08/25/2019   CO2 25 08/25/2019   GLUCOSE 77 08/25/2019   BUN  24 08/25/2019   CREATININE 0.92 08/25/2019   BILITOT 0.4 01/26/2019   ALKPHOS 61 01/26/2019   AST 21 01/26/2019   ALT 15 01/26/2019   PROT 7.0 01/26/2019   ALBUMIN 4.3 01/26/2019   CALCIUM 10.2 08/25/2019   ANIONGAP 8 05/08/2018   Lab Results  Component Value Date   CHOL 146 01/26/2019   Lab Results  Component Value Date   HDL 47 01/26/2019   Lab Results   Component Value Date   LDLCALC 69 01/26/2019   Lab Results  Component Value Date   TRIG 180 (H) 01/26/2019   Lab Results  Component Value Date   CHOLHDL 3.1 01/26/2019   No results found for: HGBA1C

## 2019-08-26 LAB — BMP8+EGFR
BUN/Creatinine Ratio: 26 (ref 12–28)
BUN: 24 mg/dL (ref 8–27)
CO2: 25 mmol/L (ref 20–29)
Calcium: 10.2 mg/dL (ref 8.7–10.3)
Chloride: 101 mmol/L (ref 96–106)
Creatinine, Ser: 0.92 mg/dL (ref 0.57–1.00)
GFR calc Af Amer: 71 mL/min/{1.73_m2} (ref >59)
GFR calc non Af Amer: 62 mL/min/{1.73_m2} (ref >59)
Glucose: 77 mg/dL (ref 65–99)
Potassium: 3.3 mmol/L — ABNORMAL LOW (ref 3.5–5.2)
Sodium: 145 mmol/L — ABNORMAL HIGH (ref 134–144)

## 2019-08-27 ENCOUNTER — Other Ambulatory Visit: Payer: Self-pay | Admitting: Family Medicine

## 2019-08-27 DIAGNOSIS — E876 Hypokalemia: Secondary | ICD-10-CM

## 2019-08-27 DIAGNOSIS — I10 Essential (primary) hypertension: Secondary | ICD-10-CM

## 2019-08-30 NOTE — Progress Notes (Signed)
Note taken to lab for them to check on this = they will report findings to B. Alona Bene

## 2019-09-08 ENCOUNTER — Ambulatory Visit: Payer: Medicare HMO | Admitting: Orthopedic Surgery

## 2019-10-01 ENCOUNTER — Other Ambulatory Visit: Payer: Self-pay | Admitting: *Deleted

## 2019-10-01 DIAGNOSIS — F411 Generalized anxiety disorder: Secondary | ICD-10-CM

## 2019-10-07 ENCOUNTER — Ambulatory Visit: Payer: Medicare HMO | Admitting: Orthopedic Surgery

## 2019-11-02 ENCOUNTER — Ambulatory Visit (INDEPENDENT_AMBULATORY_CARE_PROVIDER_SITE_OTHER): Payer: Medicare HMO

## 2019-11-02 DIAGNOSIS — Z0000 Encounter for general adult medical examination without abnormal findings: Secondary | ICD-10-CM | POA: Diagnosis not present

## 2019-11-02 NOTE — Progress Notes (Signed)
MEDICARE ANNUAL WELLNESS VISIT  11/02/2019  Telephone Visit Disclaimer This Medicare AWV was conducted by telephone due to national recommendations for restrictions regarding the COVID-19 Pandemic (e.g. social distancing).  I verified, using two identifiers, that I am speaking with Denise Underwood or their authorized healthcare agent. I discussed the limitations, risks, security, and privacy concerns of performing an evaluation and management service by telephone and the potential availability of an in-person appointment in the future. The patient expressed understanding and agreed to proceed.   Subjective:  Denise Underwood is a 74 y.o. female patient of Gwenlyn Fudge, FNP who had a Medicare Annual Wellness Visit today via telephone. Denise Underwood is Disabled and lives alone. she has two children. she reports that she is socially active and does interact with friends/family regularly. she is not physically active and enjoys sitting outside on porch.  Patient Care Team: Gwenlyn Fudge, FNP as PCP - General (Family Medicine) Magrinat, Valentino Hue, MD as Consulting Physician (Oncology) Toma Deiters, MD (Internal Medicine) Michae Kava, MD as Consulting Physician (Internal Medicine)  Advanced Directives 11/02/2019 10/02/2018 05/08/2018 04/21/2018 04/16/2018 04/13/2018 04/13/2018  Does Patient Have a Medical Advance Directive? No No No No Yes Yes Yes  Type of Advance Directive - - - - (No Data) (No Data) (No Data)  Does patient want to make changes to medical advance directive? - - - No - Patient declined No - Patient declined No - Patient declined No - Patient declined  Would patient like information on creating a medical advance directive? No - Patient declined No - Patient declined - - No - Patient declined No - Patient declined No - Patient declined    Hospital Utilization Over the Past 12 Months: # of hospitalizations or ER visits: 0 # of surgeries: 0  Review of Systems    Patient  reports that her overall health is unchanged compared to last year.  Negative except C/O Bumps with white centers on chest and back. Has rash under breast and in vaginal area. She has been using clobetasol cream and nystatin powder under breast which has helped. Pt has been applying the powder in the vaginal area with some relief. Instructed her to keep both areas as clean and dry as possible. Do not wear a bra at home and sleeping without underwear may help. Per pt she has a h/o psoriasis. Undetermined if the bumps she mentioned is psoriasis.  Patient Reported Readings (BP, Pulse, CBG, Weight, etc) none  Pain Assessment Pain : 0-10 Pain Score: 8  Pain Location: Leg Pain Orientation: Right, Lateral Pain Descriptors / Indicators: Discomfort Pain Onset: More than a month ago Pain Frequency: Intermittent Pain Relieving Factors: Tramadol  Pain Relieving Factors: Tramadol  Current Medications & Allergies (verified) Allergies as of 11/02/2019   No Known Allergies     Medication List       Accurate as of November 02, 2019 12:15 PM. If you have any questions, ask your nurse or doctor.        STOP taking these medications   busPIRone 10 MG tablet Commonly known as: BUSPAR     TAKE these medications   citalopram 40 MG tablet Commonly known as: CELEXA TAKE 1 TABLET ONCE DAILY FOR DEPRESSION.   diclofenac Sodium 1 % Gel Commonly known as: Voltaren Apply 2 g topically 4 (four) times daily.   hydrochlorothiazide 25 MG tablet Commonly known as: HYDRODIURIL Take 1 tablet (25 mg total) by mouth daily. As needed  for fluid   hydrocortisone 2.5 % cream Apply topically 2 (two) times daily.   nystatin powder Commonly known as: MYCOSTATIN/NYSTOP APPLY TOPICALLY 4 TIMES DAILY.   rosuvastatin 20 MG tablet Commonly known as: CRESTOR Take 1 tablet (20 mg total) by mouth daily.   traMADol 50 MG tablet Commonly known as: ULTRAM Take 1-2 tablets (50-100 mg total) by mouth 2 (two) times  daily as needed for severe pain.   Vitamin D3 25 MCG (1000 UT) Caps Take by mouth.       History (reviewed): Past Medical History:  Diagnosis Date  . Anxiety   . Arthritis   . Hyperlipidemia   . Hypertension   . Psoriasis    Past Surgical History:  Procedure Laterality Date  . ABDOMINAL HYSTERECTOMY    . CHOLECYSTECTOMY    . HERNIA REPAIR    . KNEE SURGERY    . TUBAL LIGATION     No family history on file. Social History   Socioeconomic History  . Marital status: Single    Spouse name: Not on file  . Number of children: 2  . Years of education: Not on file  . Highest education level: 9th grade  Occupational History  . Occupation: Retired  Tobacco Use  . Smoking status: Former Smoker    Types: Cigarettes    Quit date: 01/11/2018    Years since quitting: 1.8  . Smokeless tobacco: Never Used  Vaping Use  . Vaping Use: Never used  Substance and Sexual Activity  . Alcohol use: No  . Drug use: No  . Sexual activity: Not Currently    Birth control/protection: Surgical  Other Topics Concern  . Not on file  Social History Narrative  . Not on file   Social Determinants of Health   Financial Resource Strain:   . Difficulty of Paying Living Expenses:   Food Insecurity:   . Worried About Programme researcher, broadcasting/film/video in the Last Year:   . Barista in the Last Year:   Transportation Needs:   . Freight forwarder (Medical):   Marland Kitchen Lack of Transportation (Non-Medical):   Physical Activity:   . Days of Exercise per Week:   . Minutes of Exercise per Session:   Stress:   . Feeling of Stress :   Social Connections:   . Frequency of Communication with Friends and Family:   . Frequency of Social Gatherings with Friends and Family:   . Attends Religious Services:   . Active Member of Clubs or Organizations:   . Attends Banker Meetings:   Marland Kitchen Marital Status:     Activities of Daily Living In your present state of health, do you have any difficulty  performing the following activities: 11/02/2019  Hearing? N  Vision? N  Difficulty concentrating or making decisions? N  Walking or climbing stairs? Y  Dressing or bathing? N  Doing errands, shopping? Y  Preparing Food and eating ? Y  Using the Toilet? N  In the past six months, have you accidently leaked urine? N  Do you have problems with loss of bowel control? Y  Managing your Medications? Y  Managing your Finances? Y  Housekeeping or managing your Housekeeping? Y  Some recent data might be hidden    Patient Education/ Literacy How often do you need to have someone help you when you read instructions, pamphlets, or other written materials from your doctor or pharmacy?: 2 - Rarely What is the last grade level you  completed in school?: 9th grade  Exercise Current Exercise Habits: The patient does not participate in regular exercise at present, Exercise limited by: orthopedic condition(s)  Diet Patient reports consuming 3 meals a day and 2 snack(s) a day Patient reports that her primary diet is: Regular Patient reports that she does have regular access to food.   Depression Screen PHQ 2/9 Scores 11/02/2019 08/29/2019 01/26/2019 11/06/2018 10/02/2018 07/21/2018 06/12/2018  PHQ - 2 Score 4 4 0 0 0 0 0  PHQ- 9 Score 10 10 - 0 - - -     Fall Risk Fall Risk  11/02/2019 08/25/2019 01/26/2019 11/06/2018 10/02/2018  Falls in the past year? 0 0 1 0 1  Number falls in past yr: - - 1 - 1  Injury with Fall? - - 0 - 1  Comment - - - - -  Risk Factor Category  - - - - -  Risk for fall due to : - - Impaired mobility - History of fall(s);Impaired balance/gait  Follow up - - - - Falls prevention discussed  Comment - - - - discussed removing all throw rugs, loose cords across the floor or in walkways, the need for adequate lighting     Objective:  Denise Underwood seemed alert and oriented and she participated appropriately during our telephone visit.  Blood Pressure Weight BMI  BP Readings from  Last 3 Encounters:  08/25/19 134/84  06/09/19 125/82  01/26/19 122/72   Wt Readings from Last 3 Encounters:  08/25/19 172 lb 12.8 oz (78.4 kg)  01/26/19 173 lb 3.2 oz (78.6 kg)  11/06/18 172 lb (78 kg)   BMI Readings from Last 1 Encounters:  08/25/19 29.66 kg/m    *Unable to obtain current vital signs, weight, and BMI due to telephone visit type  Hearing/Vision  . Denise Underwood did not seem to have difficulty with hearing/understanding during the telephone conversation . Reports that she has not had a formal eye exam by an eye care professional within the past year . Reports that she has not had a formal hearing evaluation within the past year *Unable to fully assess hearing and vision during telephone visit type  Cognitive Function: 6CIT Screen 11/02/2019 10/02/2018  What Year? 0 points 0 points  What month? 0 points 0 points  What time? 0 points 0 points  Count back from 20 0 points 0 points  Months in reverse 4 points 4 points  Repeat phrase 2 points 2 points  Total Score 6 6   (Normal:0-7, Significant for Dysfunction: >8)  Normal Cognitive Function Screening: Yes   Immunization & Health Maintenance Record Immunization History  Administered Date(s) Administered  . Fluad Quad(high Dose 65+) 03/01/2019  . Influenza Inj Mdck Quad Pf 02/26/2017  . Influenza, High Dose Seasonal PF 02/17/2018  . Pneumococcal Conjugate-13 05/20/2018  . Pneumococcal Polysaccharide-23 03/01/2019    Health Maintenance  Topic Date Due  . COVID-19 Vaccine (1) Never done  . TETANUS/TDAP  01/26/2020 (Originally 02/16/1965)  . MAMMOGRAM  08/24/2020 (Originally 02/16/1995)  . DEXA SCAN  08/24/2020 (Originally 02/15/2010)  . COLONOSCOPY  08/24/2020 (Originally 12/09/2017)  . Hepatitis C Screening  08/24/2020 (Originally 10-27-1945)  . INFLUENZA VACCINE  12/12/2019  . PNA vac Low Risk Adult  Completed       Assessment  This is a routine wellness examination for Denise Underwood.  Health Maintenance:  Due or Overdue Health Maintenance Due  Topic Date Due  . COVID-19 Vaccine (1) Never done    Denise Ped  Underwood does not need a referral for [employer] Assistance: Care Management:   no Social Work:    no Prescription Assistance:  no Nutrition/Diabetes Education:  no   Plan:  Personalized Goals Goals Addressed            This Visit's Progress   . Prevent falls        Personalized Health Maintenance & Screening Recommendations    Lung Cancer Screening Recommended: no (Low Dose CT Chest recommended if Age 58-80 years, 30 pack-year currently smoking OR have quit w/in past 15 years) Hepatitis C Screening recommended: no HIV Screening recommended: no  Advanced Directives: Written information was not prepared per patient's request.  Referrals & Orders No orders of the defined types were placed in this encounter.   Follow-up Plan . Follow-up with Gwenlyn Fudge, FNP as planned . Schedule 11/25/2019    I have personally reviewed and noted the following in the patient's chart:   . Medical and social history . Use of alcohol, tobacco or illicit drugs  . Current medications and supplements . Functional ability and status . Nutritional status . Physical activity . Advanced directives . List of other physicians . Hospitalizations, surgeries, and ER visits in previous 12 months . Vitals . Screenings to include cognitive, depression, and falls . Referrals and appointments  In addition, I have reviewed and discussed with Denise Underwood certain preventive protocols, quality metrics, and best practice recommendations. A written personalized care plan for preventive services as well as general preventive health recommendations is available and can be mailed to the patient at her request.      Suzan Slick [facility]  [phone]

## 2019-11-02 NOTE — Patient Instructions (Signed)
  MEDICARE ANNUAL WELLNESS VISIT Health Maintenance Summary and Written Plan of Care  Ms. Denise Underwood ,  Thank you for allowing me to perform your Medicare Annual Wellness Visit and for your ongoing commitment to your health.   Health Maintenance & Immunization History Health Maintenance  Topic Date Due  . COVID-19 Vaccine (1) Never done  . TETANUS/TDAP  01/26/2020 (Originally 02/16/1965)  . MAMMOGRAM  08/24/2020 (Originally 02/16/1995)  . DEXA SCAN  08/24/2020 (Originally 02/15/2010)  . COLONOSCOPY  08/24/2020 (Originally 12/09/2017)  . Hepatitis C Screening  08/24/2020 (Originally [birth year])  . INFLUENZA VACCINE  12/12/2019  . PNA vac Low Risk Adult  Completed   Immunization History  Administered Date(s) Administered  . Fluad Quad(high Dose 65+) 03/01/2019  . Influenza Inj Mdck Quad Pf 02/26/2017  . Influenza, High Dose Seasonal PF 02/17/2018  . Pneumococcal Conjugate-13 05/20/2018  . Pneumococcal Polysaccharide-23 03/01/2019    These are the patient goals that we discussed: Goals Addressed            This Visit's Progress   . Prevent falls          This is a list of Health Maintenance Items that are overdue or due now: Health Maintenance Due  Topic Date Due  . COVID-19 Vaccine (1) Never done     Orders/Referrals Placed Today: No orders of the defined types were placed in this encounter.  (Contact our referral department at [phone] if you have not spoken with someone about your referral appointment within the next 5 days)    Follow-up Plan  Scheduled with Deliah Boston, FNP 11/25/2019 at 4pm.

## 2019-11-04 ENCOUNTER — Ambulatory Visit (INDEPENDENT_AMBULATORY_CARE_PROVIDER_SITE_OTHER): Payer: Medicare HMO | Admitting: Orthopedic Surgery

## 2019-11-04 ENCOUNTER — Ambulatory Visit: Payer: Medicare HMO

## 2019-11-04 ENCOUNTER — Other Ambulatory Visit: Payer: Self-pay

## 2019-11-04 ENCOUNTER — Encounter: Payer: Self-pay | Admitting: Orthopedic Surgery

## 2019-11-04 VITALS — BP 152/90 | HR 101 | Ht 64.0 in | Wt 175.5 lb

## 2019-11-04 DIAGNOSIS — G8929 Other chronic pain: Secondary | ICD-10-CM | POA: Diagnosis not present

## 2019-11-04 DIAGNOSIS — M25561 Pain in right knee: Secondary | ICD-10-CM | POA: Diagnosis not present

## 2019-11-04 DIAGNOSIS — M25562 Pain in left knee: Secondary | ICD-10-CM | POA: Diagnosis not present

## 2019-11-04 DIAGNOSIS — M7051 Other bursitis of knee, right knee: Secondary | ICD-10-CM

## 2019-11-04 MED ORDER — MELOXICAM 7.5 MG PO TABS: 7.5000 mg | ORAL_TABLET | Freq: Every day | ORAL | 5 refills | Status: DC

## 2019-11-04 NOTE — Progress Notes (Signed)
NEW PROBLEM//OFFICE VISIT  Chief Complaint  Patient presents with   Knee Pain    R/hurting off and on for about a year    HPI 74 year old female bilateral knee pain right greater than left status post arthroscopy right knee years ago done at another office.  Comes in with intermittent pain for 1 year, right knee lateral joint line and medial tibia left knee lateral joint line  The patient has been using a walker for the last 2 years her daughter is here with her and says that she is not very active   Review of Systems  All other systems reviewed and are negative.  Leg swelling joint pain frequent falls depression pollen allergy heartburn skin itches  Past Medical History:  Diagnosis Date   Anxiety    Arthritis    Hyperlipidemia    Hypertension    Psoriasis     Past Surgical History:  Procedure Laterality Date   ABDOMINAL HYSTERECTOMY     CHOLECYSTECTOMY     HERNIA REPAIR     KNEE SURGERY     TUBAL LIGATION      No family history on file. Social History   Tobacco Use   Smoking status: Former Smoker    Types: Cigarettes    Quit date: 01/11/2018    Years since quitting: 1.8   Smokeless tobacco: Never Used  Vaping Use   Vaping Use: Never used  Substance Use Topics   Alcohol use: No   Drug use: No    No Known Allergies  Current Meds  Medication Sig   Cholecalciferol (VITAMIN D3) 1000 units CAPS Take by mouth.   citalopram (CELEXA) 40 MG tablet TAKE 1 TABLET ONCE DAILY FOR DEPRESSION.   diclofenac Sodium (VOLTAREN) 1 % GEL Apply 2 g topically 4 (four) times daily.   hydrochlorothiazide (HYDRODIURIL) 25 MG tablet Take 1 tablet (25 mg total) by mouth daily. As needed for fluid   hydrocortisone 2.5 % cream Apply topically 2 (two) times daily.   nystatin (MYCOSTATIN/NYSTOP) powder APPLY TOPICALLY 4 TIMES DAILY.   rosuvastatin (CRESTOR) 20 MG tablet Take 1 tablet (20 mg total) by mouth daily.   traMADol (ULTRAM) 50 MG tablet Take 1-2 tablets  (50-100 mg total) by mouth 2 (two) times daily as needed for severe pain.    BP (!) 152/90    Pulse (!) 101    Ht 5\' 4"  (1.626 m)    Wt 175 lb 8 oz (79.6 kg)    BMI 30.12 kg/m   Physical Exam Appearance is normal medium build Oriented x3 Mood and affect normal Gait is supported by a walker Ortho Exam  Right knee lateral joint line tenderness valgus alignment small effusion flexion arc 120 ligaments stable muscle tone and strength normal skin intact mild peripheral edema normal sensation   Left knee lateral joint line tenderness valgus alignment not as bad as the right no effusion flexion arc is about 120 degrees knee is stable muscle tone and strength is normal skin is intact minimal edema normal sensation  MEDICAL DECISION MAKING  A.  Encounter Diagnoses  Name Primary?   Chronic pain of right knee Yes   Chronic pain of left knee    Pes anserinus bursitis of right knee     B. DATA ANALYSED:  X-rays AP both knees lateral and sunrise view right knee  The right knee has moderate valgus deformity with severe lateral compartment arthrosi and the left knee has mild valgus alignment with mild to moderate arthritis  in the lateral compartment  C. MANAGEMENT   Diagnosis bursitis right knee arthritis right knee and arthritis left knee  The patient has Voltaren gel and tramadol  Recommend injection and meloxicam  As I spoke with them about her treatment options the daughter tells me that the patient is not very active and she does not think she would do well with replacement surgery  Procedure note for bilateral knee injections  Procedure note left knee injection verbal consent was obtained to inject left knee joint  Timeout was completed to confirm the site of injection  The medications used were 40 mg of Depo-Medrol and 1% lidocaine 3 cc  Anesthesia was provided by ethyl chloride and the skin was prepped with alcohol.  After cleaning the skin with alcohol a 20-gauge  needle was used to inject the left knee joint. There were no complications. A sterile bandage was applied.   Procedure note right knee injection verbal consent was obtained to inject right knee joint  Timeout was completed to confirm the site of injection  The medications used were 40 mg of Depo-Medrol and 1% lidocaine 3 cc  Anesthesia was provided by ethyl chloride and the skin was prepped with alcohol.  After cleaning the skin with alcohol a 20-gauge needle was used to inject the right knee joint. There were no complications. A sterile bandage was applied.  Procedure note right knee injection for bursitis   verbal consent was obtained to inject right knee PES BURSA tendon sheath   Timeout was completed to confirm the site of injection  The medications used were 40 mg of Depo-Medrol and 1% lidocaine 3 cc  Anesthesia was provided by ethyl chloride and the skin was prepped with alcohol.  After cleaning the skin with alcohol a 25-gauge needle was used to inject the right knee bursa.  There were no complications and a sterile bandage was applied    Meds ordered this encounter  Medications   meloxicam (MOBIC) 7.5 MG tablet    Sig: Take 1 tablet (7.5 mg total) by mouth daily.    Dispense:  30 tablet    Refill:  Rochelle, MD  11/04/2019 5:19 PM

## 2019-11-25 ENCOUNTER — Ambulatory Visit (INDEPENDENT_AMBULATORY_CARE_PROVIDER_SITE_OTHER): Payer: Medicare HMO | Admitting: Family Medicine

## 2019-11-25 ENCOUNTER — Encounter: Payer: Self-pay | Admitting: Family Medicine

## 2019-11-25 DIAGNOSIS — E876 Hypokalemia: Secondary | ICD-10-CM

## 2019-11-25 DIAGNOSIS — I10 Essential (primary) hypertension: Secondary | ICD-10-CM | POA: Diagnosis not present

## 2019-11-25 DIAGNOSIS — M150 Primary generalized (osteo)arthritis: Secondary | ICD-10-CM

## 2019-11-25 DIAGNOSIS — F411 Generalized anxiety disorder: Secondary | ICD-10-CM | POA: Diagnosis not present

## 2019-11-25 DIAGNOSIS — M8949 Other hypertrophic osteoarthropathy, multiple sites: Secondary | ICD-10-CM | POA: Diagnosis not present

## 2019-11-25 DIAGNOSIS — M159 Polyosteoarthritis, unspecified: Secondary | ICD-10-CM

## 2019-11-25 MED ORDER — HYDROCHLOROTHIAZIDE 25 MG PO TABS: 25.0000 mg | ORAL_TABLET | Freq: Every day | ORAL | 1 refills | Status: DC | PRN

## 2019-11-25 MED ORDER — NYSTATIN 100000 UNIT/GM EX POWD: Freq: Three times a day (TID) | CUTANEOUS | 5 refills | Status: DC

## 2019-11-25 MED ORDER — ESCITALOPRAM OXALATE 10 MG PO TABS: 10.0000 mg | ORAL_TABLET | Freq: Every day | ORAL | 2 refills | Status: DC

## 2019-11-25 MED ORDER — TRAMADOL HCL 50 MG PO TABS: 50.0000 mg | ORAL_TABLET | Freq: Two times a day (BID) | ORAL | 2 refills | Status: DC | PRN

## 2019-11-25 NOTE — Progress Notes (Signed)
Virtual Visit via Telephone Note  I connected with Denise Underwood on 11/25/19 at 4:39 PM by telephone and verified that I am speaking with the correct person using two identifiers. Denise Underwood is currently located at home and nobody is currently with her during this visit. The provider, Loman Brooklyn, FNP is located in their office at time of visit.  I discussed the limitations, risks, security and privacy concerns of performing an evaluation and management service by telephone and the availability of in person appointments. I also discussed with the patient that there may be a patient responsible charge related to this service. The patient expressed understanding and agreed to proceed.  Subjective: PCP: Loman Brooklyn, FNP  Chief Complaint  Patient presents with   Medical Management of Chronic Issues   Patient reports she quit taking the buspirone as it was increasing her and causing her to have bad dreams.  States her anxiety was great when she was on Xanax once a day.  Does not feel she currently has trouble with depression since she is not on the buspirone anymore.   ROS: Per HPI  Current Outpatient Medications:    Cholecalciferol (VITAMIN D3) 1000 units CAPS, Take by mouth., Disp: , Rfl:    citalopram (CELEXA) 40 MG tablet, TAKE 1 TABLET ONCE DAILY FOR DEPRESSION., Disp: 90 tablet, Rfl: 1   diclofenac Sodium (VOLTAREN) 1 % GEL, Apply 2 g topically 4 (four) times daily., Disp: 350 g, Rfl: 2   hydrochlorothiazide (HYDRODIURIL) 25 MG tablet, Take 1 tablet (25 mg total) by mouth daily. As needed for fluid, Disp: 90 tablet, Rfl: 1   hydrocortisone 2.5 % cream, Apply topically 2 (two) times daily., Disp: 453.6 g, Rfl: 2   meloxicam (MOBIC) 7.5 MG tablet, Take 1 tablet (7.5 mg total) by mouth daily., Disp: 30 tablet, Rfl: 5   nystatin (MYCOSTATIN/NYSTOP) powder, APPLY TOPICALLY 4 TIMES DAILY., Disp: 60 g, Rfl: 5   rosuvastatin (CRESTOR) 20 MG tablet, Take 1 tablet (20 mg  total) by mouth daily., Disp: 90 tablet, Rfl: 1   traMADol (ULTRAM) 50 MG tablet, Take 1-2 tablets (50-100 mg total) by mouth 2 (two) times daily as needed for severe pain., Disp: 90 tablet, Rfl: 2  No Known Allergies Past Medical History:  Diagnosis Date   Anxiety    Arthritis    Hyperlipidemia    Hypertension    Psoriasis     Observations/Objective: A&O  No respiratory distress or wheezing audible over the phone Mood, judgement, and thought processes all WNL  Assessment and Plan: 1. Primary osteoarthritis involving multiple joints - Well controlled on current regimen.  - traMADol (ULTRAM) 50 MG tablet; Take 1-2 tablets (50-100 mg total) by mouth 2 (two) times daily as needed for severe pain.  Dispense: 90 tablet; Refill: 2 - BMP8+EGFR; Future  2. GAD (generalized anxiety disorder) - Uncontrolled.  Celexa discontinued and patient was started on Lexapro 10 mg daily.  Discussed we would not be doing Xanax for long-term management of anxiety. - escitalopram (LEXAPRO) 10 MG tablet; Take 1 tablet (10 mg total) by mouth daily.  Dispense: 30 tablet; Refill: 2 - BMP8+EGFR; Future  3. Essential hypertension - Well controlled on current regimen.  - hydrochlorothiazide (HYDRODIURIL) 25 MG tablet; Take 1 tablet (25 mg total) by mouth daily as needed.  Dispense: 90 tablet; Refill: 1 - BMP8+EGFR; Future  4. Hypokalemia - BMP8+EGFR; Future   Follow Up Instructions: Return in about 3 months (around 02/25/2020) for follow-up  of chronic medication conditions.  I discussed the assessment and treatment plan with the patient. The patient was provided an opportunity to ask questions and all were answered. The patient agreed with the plan and demonstrated an understanding of the instructions.   The patient was advised to call back or seek an in-person evaluation if the symptoms worsen or if the condition fails to improve as anticipated.  The above assessment and management plan was  discussed with the patient. The patient verbalized understanding of and has agreed to the management plan. Patient is aware to call the clinic if symptoms persist or worsen. Patient is aware when to return to the clinic for a follow-up visit. Patient educated on when it is appropriate to go to the emergency department.   Time call ended: 4:53 PM  I provided 16 minutes of non-face-to-face time during this encounter.  Hendricks Limes, MSN, APRN, FNP-C [facility] 11/25/19

## 2019-11-26 ENCOUNTER — Telehealth: Payer: Self-pay | Admitting: *Deleted

## 2019-11-26 NOTE — Telephone Encounter (Signed)
Pa started today for Nystatin 100000 g powder  NUU:[id number] Sent to plan today

## 2019-11-28 DIAGNOSIS — R69 Illness, unspecified: Secondary | ICD-10-CM | POA: Diagnosis not present

## 2019-11-28 DIAGNOSIS — R5381 Other malaise: Secondary | ICD-10-CM | POA: Diagnosis not present

## 2019-11-28 DIAGNOSIS — W19XXXA Unspecified fall, initial encounter: Secondary | ICD-10-CM | POA: Diagnosis not present

## 2019-11-29 NOTE — Telephone Encounter (Signed)
Outcome Deniedon July 16 You asked for the drug listed above for your Tinea corporis (ringworm). [employer] follows Medicare rules. The Medicare rule in the Prescription Drug Manual (Chapter 6, Section 10.6) says an off-label use of a drug is a use that is not included on the drugs label as approved by the U.S. Food and Drug Administration (FDA). FDA approved drugs used for a disease other than what is on the official label may be covered under Medicare if [employer] determines the use to be medically accepted. [employer] makes these decisions on a case-by-case basis. We take into consideration the two major drug compendia. These compendia (or drug guides) are called the DRUGDEX Information System and the [facility] Formulary Service Drug Information (AHFS-DI). Off-label use is medically accepted when there is evidence in one or more of the compendia that it works for the disease in question. [employer] has determined that the off-label use of this drug for your condition is not medically accepted per Medicare rules and isn&apos;t covered based on available information.   Please advise

## 2019-11-30 MED ORDER — NYSTATIN 100000 UNIT/GM EX CREA
1.0000 | TOPICAL_CREAM | Freq: Two times a day (BID) | CUTANEOUS | 1 refills | Status: DC
Start: 2019-11-30 — End: 2021-10-02

## 2019-11-30 NOTE — Telephone Encounter (Signed)
I sent the cream to see if it is covered.

## 2020-02-25 ENCOUNTER — Ambulatory Visit: Payer: Medicare HMO | Admitting: Family Medicine

## 2020-02-28 ENCOUNTER — Telehealth: Payer: Self-pay

## 2020-02-28 NOTE — Telephone Encounter (Signed)
Please put her in for an OV with me on Wednesday.  Must be seen Face to face for controlled substance

## 2020-02-28 NOTE — Telephone Encounter (Unsigned)
appt made for 03/01/20. Pt aware

## 2020-02-28 NOTE — Telephone Encounter (Signed)
  Prescription Request  02/28/2020  What is the name of the medication or equipment? traMADol (ULTRAM) 50 MG tablet  escitalopram (LEXAPRO) 10 MG tablet hydrochlorothiazide (HYDRODIURIL) 25 MG tablet  meloxicam (MOBIC) 7.5 MG tablet    Have you contacted your pharmacy to request a refill? (if applicable) yes  Which pharmacy would you like this sent to? [facility], pt had appt scheduled last Friday with Britney but it was cancelled    Patient notified that their request is being sent to the clinical staff for review and that they should receive a response within 2 business days.

## 2020-02-28 NOTE — Telephone Encounter (Signed)
Please advise if medication can be sent in until patient can get back in the office.  Had appointment but was canceled since provider was out.  Advise on Tamadol.  Covering pcp - please advise

## 2020-03-01 ENCOUNTER — Encounter: Payer: Self-pay | Admitting: Family Medicine

## 2020-03-01 ENCOUNTER — Other Ambulatory Visit: Payer: Self-pay

## 2020-03-01 ENCOUNTER — Ambulatory Visit (INDEPENDENT_AMBULATORY_CARE_PROVIDER_SITE_OTHER): Payer: Medicare HMO | Admitting: Family Medicine

## 2020-03-01 VITALS — BP 150/86 | HR 94 | Temp 97.8°F | Ht 64.0 in | Wt 179.0 lb

## 2020-03-01 DIAGNOSIS — M150 Primary generalized (osteo)arthritis: Secondary | ICD-10-CM

## 2020-03-01 DIAGNOSIS — Z23 Encounter for immunization: Secondary | ICD-10-CM | POA: Diagnosis not present

## 2020-03-01 DIAGNOSIS — I10 Essential (primary) hypertension: Secondary | ICD-10-CM | POA: Diagnosis not present

## 2020-03-01 DIAGNOSIS — G8929 Other chronic pain: Secondary | ICD-10-CM | POA: Diagnosis not present

## 2020-03-01 DIAGNOSIS — E7841 Elevated Lipoprotein(a): Secondary | ICD-10-CM | POA: Diagnosis not present

## 2020-03-01 DIAGNOSIS — M8949 Other hypertrophic osteoarthropathy, multiple sites: Secondary | ICD-10-CM

## 2020-03-01 DIAGNOSIS — M25561 Pain in right knee: Secondary | ICD-10-CM | POA: Diagnosis not present

## 2020-03-01 DIAGNOSIS — F411 Generalized anxiety disorder: Secondary | ICD-10-CM | POA: Diagnosis not present

## 2020-03-01 DIAGNOSIS — M25562 Pain in left knee: Secondary | ICD-10-CM

## 2020-03-01 DIAGNOSIS — S93491A Sprain of other ligament of right ankle, initial encounter: Secondary | ICD-10-CM | POA: Diagnosis not present

## 2020-03-01 DIAGNOSIS — M159 Polyosteoarthritis, unspecified: Secondary | ICD-10-CM

## 2020-03-01 MED ORDER — HYDROCHLOROTHIAZIDE 25 MG PO TABS: 25.0000 mg | ORAL_TABLET | Freq: Every day | ORAL | 1 refills | Status: DC | PRN

## 2020-03-01 MED ORDER — TRAMADOL HCL 50 MG PO TABS: 50.0000 mg | ORAL_TABLET | Freq: Two times a day (BID) | ORAL | 2 refills | Status: DC | PRN

## 2020-03-01 MED ORDER — ROSUVASTATIN CALCIUM 20 MG PO TABS: 20.0000 mg | ORAL_TABLET | Freq: Every day | ORAL | 1 refills | Status: DC

## 2020-03-01 MED ORDER — DICLOFENAC SODIUM 1 % EX GEL: 2.0000 g | Freq: Four times a day (QID) | CUTANEOUS | 2 refills | Status: DC

## 2020-03-01 MED ORDER — ESCITALOPRAM OXALATE 10 MG PO TABS: 10.0000 mg | ORAL_TABLET | Freq: Every day | ORAL | 2 refills | Status: DC

## 2020-03-01 NOTE — Progress Notes (Signed)
Subjective: CC: pain management PCP: Gwenlyn Fudge, FNP MVH:QIONGEX Denise Underwood is a 74 y.o. female presenting to clinic today for:  1. Pain management/ ankle pain Patient with chronic history of pain.  She is treated with both topical Voltaren gel and meloxicam 7.5 mg daily.  She was unaware that she was visit separate the Lexapro and meloxicam.  Denies any GI bleeding.  She is treated with tramadol daily.  She reports using 1 to 2 tablets typically 2-3 times per day.  Denies any constipation, falls, visual or auditory hallucinations.  No respiratory depression.  She does report that she has been having some left lateral ankle swelling has been present for several months.  Swelling seems to be worse during the daytime but better at nighttime.  Not currently using any medications or treatments for this.  Does not recall any injury  2.  Hypertension with hyperlipidemia Patient is compliant with hydrochlorothiazide and Crestor but did not take her medications today because she was try to get here on time.  No chest pain, shortness of breath or falls.   ROS: Per HPI  No Known Allergies Past Medical History:  Diagnosis Date  . Anxiety   . Arthritis   . Hyperlipidemia   . Hypertension   . Psoriasis     Current Outpatient Medications:  .  Cholecalciferol (VITAMIN D3) 1000 units CAPS, Take by mouth., Disp: , Rfl:  .  diclofenac Sodium (VOLTAREN) 1 % GEL, Apply 2 g topically 4 (four) times daily., Disp: 350 g, Rfl: 2 .  escitalopram (LEXAPRO) 10 MG tablet, Take 1 tablet (10 mg total) by mouth daily., Disp: 30 tablet, Rfl: 2 .  hydrochlorothiazide (HYDRODIURIL) 25 MG tablet, Take 1 tablet (25 mg total) by mouth daily as needed., Disp: 90 tablet, Rfl: 1 .  hydrocortisone 2.5 % cream, Apply topically 2 (two) times daily., Disp: 453.6 g, Rfl: 2 .  meloxicam (MOBIC) 7.5 MG tablet, Take 1 tablet (7.5 mg total) by mouth daily., Disp: 30 tablet, Rfl: 5 .  nystatin cream (MYCOSTATIN), Apply 1  application topically 2 (two) times daily., Disp: 30 g, Rfl: 1 .  rosuvastatin (CRESTOR) 20 MG tablet, Take 1 tablet (20 mg total) by mouth daily., Disp: 90 tablet, Rfl: 1 .  traMADol (ULTRAM) 50 MG tablet, Take 1-2 tablets (50-100 mg total) by mouth 2 (two) times daily as needed for severe pain., Disp: 90 tablet, Rfl: 2 Social History   Socioeconomic History  . Marital status: Single    Spouse name: Not on file  . Number of children: 2  . Years of education: Not on file  . Highest education level: 9th grade  Occupational History  . Occupation: Retired  Tobacco Use  . Smoking status: Former Smoker    Types: Cigarettes    Quit date: 01/11/2018    Years since quitting: 2.1  . Smokeless tobacco: Never Used  Vaping Use  . Vaping Use: Never used  Substance and Sexual Activity  . Alcohol use: No  . Drug use: No  . Sexual activity: Not Currently    Birth control/protection: Surgical  Other Topics Concern  . Not on file  Social History Narrative  . Not on file   Social Determinants of Health   Financial Resource Strain:   . Difficulty of Paying Living Expenses: Not on file  Food Insecurity:   . Worried About Programme researcher, broadcasting/film/video in the Last Year: Not on file  . Ran Out of Food in the Last  Year: Not on file  Transportation Needs:   . Lack of Transportation (Medical): Not on file  . Lack of Transportation (Non-Medical): Not on file  Physical Activity:   . Days of Exercise per Week: Not on file  . Minutes of Exercise per Session: Not on file  Stress:   . Feeling of Stress : Not on file  Social Connections:   . Frequency of Communication with Friends and Family: Not on file  . Frequency of Social Gatherings with Friends and Family: Not on file  . Attends Religious Services: Not on file  . Active Member of Clubs or Organizations: Not on file  . Attends Banker Meetings: Not on file  . Marital Status: Not on file  Intimate Partner Violence:   . Fear of Current or  Ex-Partner: Not on file  . Emotionally Abused: Not on file  . Physically Abused: Not on file  . Sexually Abused: Not on file   No family history on file.  Objective: Office vital signs reviewed. BP (!) 150/86   Pulse 94   Temp 97.8 F (36.6 C) (Temporal)   Ht 5\' 4"  (1.626 m)   Wt 179 lb (81.2 kg)   SpO2 94%   BMI 30.73 kg/m   Physical Examination:  General: Awake, alert, No acute distress HEENT: Normal, sclera white Cardio: regular rate and rhythm, S1S2 heard, no murmurs appreciated Pulm: clear to auscultation bilaterally, no wheezes, rhonchi or rales; normal work of breathing on room air MSK: Antalgic gait.  Normal station.  She has soft tissue swelling noted over the ATFL on the left ankle.  This is tender to palpation.  No appreciable discoloration.  Assessment/ Plan: 74 y.o. female   1. Primary osteoarthritis involving multiple joints Stable.  The national narcotic database was reviewed and there were no red flags.  I have refilled her tramadol while her PCP is out on maternity leave.  She has UDS and CSC in place - traMADol (ULTRAM) 50 MG tablet; Take 1-2 tablets (50-100 mg total) by mouth 2 (two) times daily as needed for severe pain.  Dispense: 90 tablet; Refill: 2  2. Chronic pain of right knee - diclofenac Sodium (VOLTAREN) 1 % GEL; Apply 2 g topically 4 (four) times daily.  Dispense: 350 g; Refill: 2  3. Chronic pain of left knee - diclofenac Sodium (VOLTAREN) 1 % GEL; Apply 2 g topically 4 (four) times daily.  Dispense: 350 g; Refill: 2  4. GAD (generalized anxiety disorder) Advised against use of NSAIDs with Lexapro as this does increase her risk for GI bleed. - escitalopram (LEXAPRO) 10 MG tablet; Take 1 tablet (10 mg total) by mouth daily.  Dispense: 30 tablet; Refill: 2  5. Essential hypertension Not controlled but patient has not taken her medicines today - hydrochlorothiazide (HYDRODIURIL) 25 MG tablet; Take 1 tablet (25 mg total) by mouth daily as  needed.  Dispense: 90 tablet; Refill: 1  6. Elevated lipoprotein(a) - rosuvastatin (CRESTOR) 20 MG tablet; Take 1 tablet (20 mg total) by mouth daily.  Dispense: 90 tablet; Refill: 1  7. Sprain of anterior talofibular ligament of right ankle, initial encounter Suspect based on today's exam that she has a sprain of the ATFL.  Encouraged to use a lace up brace. This was provided today.  I did offer her referral for ultrasound but she declined this today.  We will continue to monitor this and if it does not improve she is agreeable to getting checked out  No orders of the defined types were placed in this encounter.  No orders of the defined types were placed in this encounter.   Influenza vaccination administered Raliegh Ip, DO [facility] [phone]

## 2020-03-01 NOTE — Patient Instructions (Signed)
Caution Lexapro and Mobic can cause GI bleed   Ankle Sprain  An ankle sprain is a stretch or tear in one of the tough tissues (ligaments) that connect the bones in your ankle. An ankle sprain can happen when the ankle rolls outward (inversion sprain) or inward (eversion sprain). What are the causes? This condition is caused by rolling or twisting the ankle. What increases the risk? You are more likely to develop this condition if you play sports. What are the signs or symptoms? Symptoms of this condition include:  Pain in your ankle.  Swelling.  Bruising. This may happen right after you sprain your ankle or 1-2 days later.  Trouble standing or walking. How is this diagnosed? This condition is diagnosed with:  A physical exam. During the exam, your doctor will press on certain parts of your foot and ankle and try to move them in certain ways.  X-ray imaging. These may be taken to see how bad the sprain is and to check for broken bones. How is this treated? This condition may be treated with:  A brace or splint. This is used to keep the ankle from moving until it heals.  An elastic bandage. This is used to support the ankle.  Crutches.  Pain medicine.  Surgery. This may be needed if the sprain is very bad.  Physical therapy. This may help to improve movement in the ankle. Follow these instructions at home: If you have a brace or a splint:  Wear the brace or splint as told by your doctor. Remove it only as told by your doctor.  Loosen the brace or splint if your toes: ? Tingle. ? Lose feeling (become numb). ? Turn cold and blue.  Keep the brace or splint clean.  If the brace or splint is not waterproof: ? Do not let it get wet. ? Cover it with a watertight covering when you take a bath or a shower. If you have an elastic bandage (dressing):  Remove it to shower or bathe.  Try not to move your ankle much, but wiggle your toes from time to time. This helps to  prevent swelling.  Adjust the dressing if it feels too tight.  Loosen the dressing if your foot: ? Loses feeling. ? Tingles. ? Becomes cold and blue. Managing pain, stiffness, and swelling   Take over-the-counter and prescription medicines only as told by doctor.  For 2-3 days, keep your ankle raised (elevated) above the level of your heart.  If told, put ice on the injured area: ? If you have a removable brace or splint, remove it as told by your doctor. ? Put ice in a plastic bag. ? Place a towel between your skin and the bag. ? Leave the ice on for 20 minutes, 2-3 times a day. General instructions  Rest your ankle.  Do not use your injured leg to support your body weight until your doctor says that you can. Use crutches as told by your doctor.  Do not use any products that contain nicotine or tobacco, such as cigarettes, e-cigarettes, and chewing tobacco. If you need help quitting, ask your doctor.  Keep all follow-up visits as told by your doctor. Contact a doctor if:  Your bruises or swelling are quickly getting worse.  Your pain does not get better after you take medicine. Get help right away if:  You cannot feel your toes or foot.  Your foot or toes look blue.  You have very bad pain that  gets worse. Summary  An ankle sprain is a stretch or tear in one of the tough tissues (ligaments) that connect the bones in your ankle.  This condition is caused by rolling or twisting the ankle.  Symptoms include pain, swelling, bruising, and trouble walking.  To help with pain and swelling, put ice on the injured ankle, raise your ankle above the level of your heart, and use an elastic bandage. Also, rest as told by your doctor.  Keep all follow-up visits as told by your doctor. This is important. This information is not intended to replace advice given to you by your health care provider. Make sure you discuss any questions you have with your health care  provider. Document Revised: 09/23/2017 Document Reviewed: 09/23/2017 Elsevier Patient Education  2020 [employer].

## 2020-03-22 IMAGING — DX DG KNEE 1-2V*R*
2 series · 2 of 2 positions shown · non-contrast
Comparison: None.

CLINICAL DATA: Chronic pain.  Recent fall

EXAM:
RIGHT KNEE - 1-2 VIEW

[knee ap]
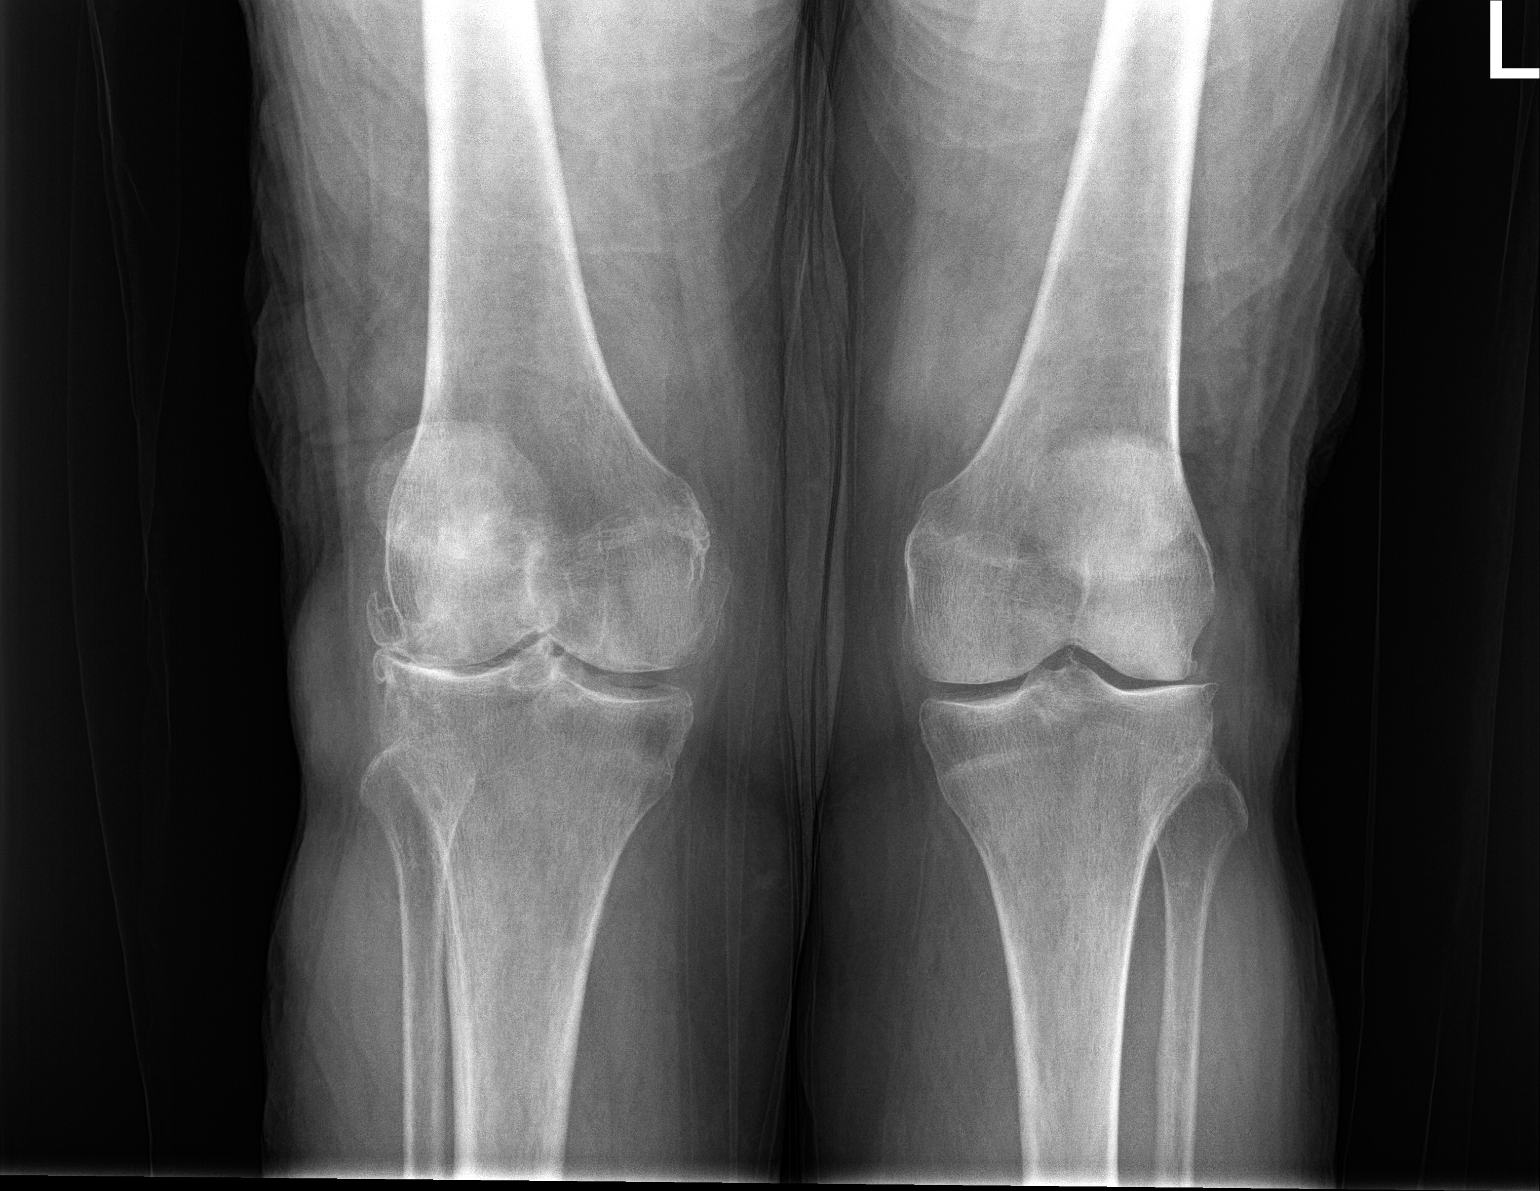

[knee lat]
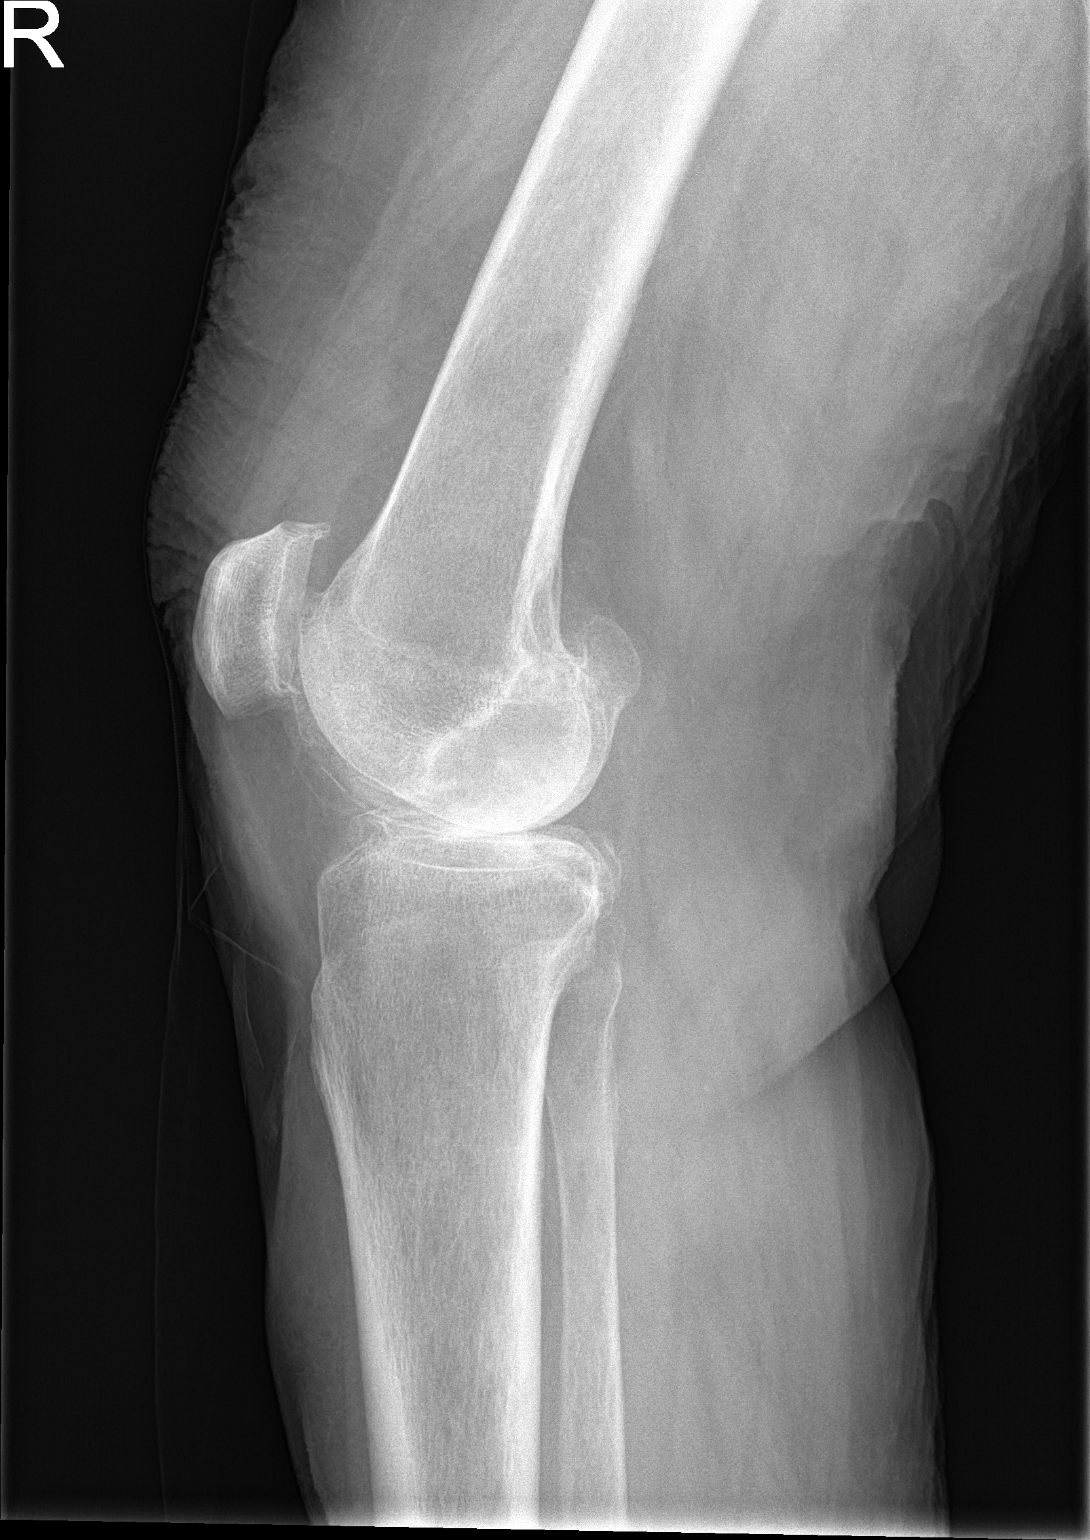

[2 of 2 positions shown; findings below may reference images not displayed]

FINDINGS: Right knee: Frontal and lateral views obtained. No fracture or
dislocation. No joint effusion. There is marked narrowing laterally
with moderate narrowing in the patellofemoral joint. There is
spurring in all compartments. No erosive change.

AP left knee: No fracture or dislocation on frontal view. There is
narrowing medially and laterally, more severe laterally, with
spurring laterally.
IMPRESSION: Right knee: Extensive osteoarthritic change, most severe laterally.
Spurring in all compartments. No fracture or joint effusion.

AP left knee: Osteoarthritic change, more severe laterally. No
fracture or dislocation on frontal view.

## 2020-04-27 ENCOUNTER — Ambulatory Visit: Payer: Medicare HMO | Admitting: Orthopedic Surgery

## 2020-04-27 ENCOUNTER — Other Ambulatory Visit: Payer: Self-pay | Admitting: Orthopedic Surgery

## 2020-04-27 DIAGNOSIS — M25561 Pain in right knee: Secondary | ICD-10-CM

## 2020-04-27 DIAGNOSIS — M7051 Other bursitis of knee, right knee: Secondary | ICD-10-CM

## 2020-04-27 DIAGNOSIS — G8929 Other chronic pain: Secondary | ICD-10-CM

## 2020-04-27 MED ORDER — MELOXICAM 7.5 MG PO TABS: 7.5000 mg | ORAL_TABLET | Freq: Every day | ORAL | 5 refills | Status: DC

## 2020-04-27 NOTE — Telephone Encounter (Signed)
Patient requests refill on Meloxicam 7.5 mgs.  Qty 30  Refills 5   Sig: Take 1 tablet (7.5 mg total) by mouth daily.  Patient states she uses [employer]

## 2020-05-22 ENCOUNTER — Encounter: Payer: Self-pay | Admitting: Orthopedic Surgery

## 2020-05-22 ENCOUNTER — Ambulatory Visit: Payer: Medicare HMO | Admitting: Orthopedic Surgery

## 2020-05-26 ENCOUNTER — Other Ambulatory Visit: Payer: Self-pay | Admitting: Family Medicine

## 2020-05-26 DIAGNOSIS — F411 Generalized anxiety disorder: Secondary | ICD-10-CM

## 2020-05-26 DIAGNOSIS — M8949 Other hypertrophic osteoarthropathy, multiple sites: Secondary | ICD-10-CM

## 2020-05-26 DIAGNOSIS — I10 Essential (primary) hypertension: Secondary | ICD-10-CM

## 2020-05-26 DIAGNOSIS — M159 Polyosteoarthritis, unspecified: Secondary | ICD-10-CM

## 2020-06-06 ENCOUNTER — Other Ambulatory Visit: Payer: Self-pay | Admitting: Family Medicine

## 2020-06-06 ENCOUNTER — Ambulatory Visit: Payer: Medicare HMO | Admitting: Family Medicine

## 2020-06-06 DIAGNOSIS — M8949 Other hypertrophic osteoarthropathy, multiple sites: Secondary | ICD-10-CM

## 2020-06-06 DIAGNOSIS — M159 Polyosteoarthritis, unspecified: Secondary | ICD-10-CM

## 2020-06-09 ENCOUNTER — Encounter: Payer: Self-pay | Admitting: Family Medicine

## 2020-06-21 ENCOUNTER — Other Ambulatory Visit: Payer: Self-pay

## 2020-06-21 ENCOUNTER — Ambulatory Visit (INDEPENDENT_AMBULATORY_CARE_PROVIDER_SITE_OTHER): Payer: Medicare HMO | Admitting: Family Medicine

## 2020-06-21 ENCOUNTER — Encounter: Payer: Self-pay | Admitting: Family Medicine

## 2020-06-21 VITALS — BP 143/83 | HR 86 | Temp 97.7°F | Ht 64.0 in | Wt 178.0 lb

## 2020-06-21 DIAGNOSIS — R062 Wheezing: Secondary | ICD-10-CM

## 2020-06-21 DIAGNOSIS — I10 Essential (primary) hypertension: Secondary | ICD-10-CM | POA: Diagnosis not present

## 2020-06-21 DIAGNOSIS — M25561 Pain in right knee: Secondary | ICD-10-CM

## 2020-06-21 DIAGNOSIS — E7841 Elevated Lipoprotein(a): Secondary | ICD-10-CM

## 2020-06-21 DIAGNOSIS — M159 Polyosteoarthritis, unspecified: Secondary | ICD-10-CM

## 2020-06-21 DIAGNOSIS — G8929 Other chronic pain: Secondary | ICD-10-CM | POA: Diagnosis not present

## 2020-06-21 DIAGNOSIS — F411 Generalized anxiety disorder: Secondary | ICD-10-CM

## 2020-06-21 DIAGNOSIS — Z720 Tobacco use: Secondary | ICD-10-CM

## 2020-06-21 DIAGNOSIS — Z79899 Other long term (current) drug therapy: Secondary | ICD-10-CM

## 2020-06-21 DIAGNOSIS — M7051 Other bursitis of knee, right knee: Secondary | ICD-10-CM

## 2020-06-21 DIAGNOSIS — M25562 Pain in left knee: Secondary | ICD-10-CM | POA: Diagnosis not present

## 2020-06-21 DIAGNOSIS — M8949 Other hypertrophic osteoarthropathy, multiple sites: Secondary | ICD-10-CM | POA: Diagnosis not present

## 2020-06-21 MED ORDER — ROSUVASTATIN CALCIUM 20 MG PO TABS: 20.0000 mg | ORAL_TABLET | Freq: Every day | ORAL | 1 refills | Status: DC

## 2020-06-21 MED ORDER — HYDROCHLOROTHIAZIDE 25 MG PO TABS
ORAL_TABLET | ORAL | 1 refills | Status: DC
Start: 2020-06-21 — End: 2020-12-27

## 2020-06-21 MED ORDER — ESCITALOPRAM OXALATE 20 MG PO TABS: 20.0000 mg | ORAL_TABLET | Freq: Every day | ORAL | 1 refills | Status: DC

## 2020-06-21 MED ORDER — TRAMADOL HCL 100 MG PO TABS: 100.0000 mg | ORAL_TABLET | Freq: Two times a day (BID) | ORAL | 2 refills | Status: DC | PRN

## 2020-06-21 MED ORDER — ALBUTEROL SULFATE HFA 108 (90 BASE) MCG/ACT IN AERS: 2.0000 | INHALATION_SPRAY | Freq: Four times a day (QID) | RESPIRATORY_TRACT | 2 refills | Status: DC | PRN

## 2020-06-21 NOTE — Progress Notes (Unsigned)
Assessment & Plan:  1. GAD (generalized anxiety disorder) Improving.  Lexapro increased from 10 mg to 20 mg once daily. - escitalopram (LEXAPRO) 20 MG tablet; Take 1 tablet (20 mg total) by mouth daily.  Dispense: 90 tablet; Refill: 1 - CMP14+EGFR  2-4. Chronic pain of right knee/Chronic pain of left knee/Primary osteoarthritis involving multiple joints Controlled as long as she can take tramadol 100 mg twice daily.  Prescription changed from 50 mg to 100 mg tablets and she was given 60/month.  Controlled substance agreement is in place.  Urine drug screen collected today.  PDMP reviewed with no concerning findings. - CMP14+EGFR - Compliance Drug Analysis, Ur - traMADol HCl 100 MG TABS; Take 100 mg by mouth 2 (two) times daily as needed.  Dispense: 60 tablet; Refill: 2  5. Controlled substance agreement signed - Compliance Drug Analysis, Ur - traMADol HCl 100 MG TABS; Take 100 mg by mouth 2 (two) times daily as needed.  Dispense: 60 tablet; Refill: 2  6. Essential hypertension Well controlled on current regimen.  - hydrochlorothiazide (HYDRODIURIL) 25 MG tablet; TAKE (1) TABLET BY MOUTH ONCE DAILY AS NEEDED.  Dispense: 90 tablet; Refill: 1 - CBC with Differential/Platelet - CMP14+EGFR - Lipid panel - TSH  7. Elevated lipoprotein(a) Labs to assess. - rosuvastatin (CRESTOR) 20 MG tablet; Take 1 tablet (20 mg total) by mouth daily.  Dispense: 90 tablet; Refill: 1 - CMP14+EGFR - Lipid panel  8. Wheezing - albuterol (VENTOLIN HFA) 108 (90 Base) MCG/ACT inhaler; Inhale 2 puffs into the lungs every 6 (six) hours as needed.  Dispense: 18 g; Refill: 2  9. Tobacco use Encourage smoking cessation.   Return in about 3 months (around 09/18/2020) for follow-up of chronic medication conditions.  Hendricks Limes, MSN, APRN, FNP-C [facility]  Subjective:    Patient ID: Denise Underwood, female    DOB: 1946/02/10, 75 y.o.   MRN: [id number]  Patient Care Team: Loman Brooklyn, FNP as PCP - General (Family Medicine) Magrinat, Virgie Dad, MD as Consulting Physician (Oncology) Neale Burly, MD (Internal Medicine) Milus Height, MD as Consulting Physician (Internal Medicine)   Chief Complaint:  Chief Complaint  Patient presents with  . Hypertension    Check up of chronic medical conditions     HPI: Denise Underwood is a 75 y.o. female presenting on 06/21/2020 for Hypertension (Check up of chronic medical conditions/)  Anxiety: Anxiety is improving, but patient feels it could be better.  GAD 7 : Generalized Anxiety Score 06/21/2020 08/29/2019  Nervous, Anxious, on Edge 3 3  Control/stop worrying 3 3  Worry too much - different things 2 3  Trouble relaxing 0 3  Restless 0 1  Easily annoyed or irritable 3 2  Afraid - awful might happen 0 3  Total GAD 7 Score 11 18  Anxiety Difficulty Not difficult at all Somewhat difficult   Pain assessment: Cause of pain- osteoarthritis Pain location- both knees Pain on scale of 1-10- 9/10 without medication, 3-4/10 with medication Frequency- daily What increases pain- standing and walking What makes pain better- sitting, medication Effects on ADL- makes it very difficult - patient has not been able to make her bed or take a shower in the past month.  She has only had sink baths.  All of her meals are from the microwave, as she cannot stand in the kitchen to cook. Any change in general medical condition- none  Current opioids rx- Tramadol 50-100 mg BID PRN -  she reports 100 mg twice daily works well for her, but she does not have enough supply each month to do that all month long # meds rx- 90 Effectiveness of current meds- good if she can take 100 mg twice daily Adverse reactions from pain meds- none Morphine equivalent- 20 MME/day  Pill count performed-No Last drug screen - patient has not been able to leave another urine previously ( high risk q48m moderate risk q651mlow risk yearly ) Urine drug screen  today- Yes Was the [place]- Yes  If yes were their any concerning findings? - No  Overdose risk: 80  Opioid Risk  06/22/2020  Alcohol 0  Illegal Drugs 0  Rx Drugs 0  Alcohol 0  Illegal Drugs 0  Rx Drugs 0  Age between 16-45 years  0  History of Preadolescent Sexual Abuse 0  Psychological Disease 0  Depression 0  Opioid Risk Tool Scoring 0  Opioid Risk Interpretation Low Risk    Pain contract signed on:  New complaints: Patient reports she does a lot of wheezing at night.  She is a current smoker.  Social history:  Relevant past medical, surgical, family and social history reviewed and updated as indicated. Interim medical history since our last visit reviewed.  Allergies and medications reviewed and updated.  DATA REVIEWED: CHART IN EPIC  ROS: Negative unless specifically indicated above in HPI.    Current Outpatient Medications:  .  Cholecalciferol (VITAMIN D3) 1000 units CAPS, Take by mouth., Disp: , Rfl:  .  diclofenac Sodium (VOLTAREN) 1 % GEL, Apply 2 g topically 4 (four) times daily., Disp: 350 g, Rfl: 2 .  escitalopram (LEXAPRO) 10 MG tablet, TAKE 1 TABLET BY MOUTH ONCE DAILY., Disp: 30 tablet, Rfl: 0 .  hydrochlorothiazide (HYDRODIURIL) 25 MG tablet, TAKE (1) TABLET BY MOUTH ONCE DAILY AS NEEDED., Disp: 30 tablet, Rfl: 0 .  hydrocortisone 2.5 % cream, Apply topically 2 (two) times daily., Disp: 453.6 g, Rfl: 2 .  meloxicam (MOBIC) 7.5 MG tablet, Take 1 tablet (7.5 mg total) by mouth daily., Disp: 30 tablet, Rfl: 5 .  nystatin cream (MYCOSTATIN), Apply 1 application topically 2 (two) times daily., Disp: 30 g, Rfl: 1 .  rosuvastatin (CRESTOR) 20 MG tablet, Take 1 tablet (20 mg total) by mouth daily., Disp: 90 tablet, Rfl: 1 .  traMADol (ULTRAM) 50 MG tablet, Take 1-2 tablets (50-100 mg total) by mouth 2 (two) times daily as needed for severe pain., Disp: 90 tablet, Rfl: 2   No Known Allergies Past Medical History:  Diagnosis Date  . Anxiety   .  Arthritis   . Hyperlipidemia   . Hypertension   . Psoriasis     Past Surgical History:  Procedure Laterality Date  . ABDOMINAL HYSTERECTOMY    . CHOLECYSTECTOMY    . HERNIA REPAIR    . KNEE SURGERY    . TUBAL LIGATION      Social History   Socioeconomic History  . Marital status: Single    Spouse name: Not on file  . Number of children: 2  . Years of education: Not on file  . Highest education level: 9th grade  Occupational History  . Occupation: Retired  Tobacco Use  . Smoking status: Former Smoker    Types: Cigarettes    Quit date: 01/11/2018    Years since quitting: 2.4  . Smokeless tobacco: Never Used  Vaping Use  . Vaping Use: Never used  Substance and Sexual Activity  . Alcohol use:  No  . Drug use: No  . Sexual activity: Not Currently    Birth control/protection: Surgical  Other Topics Concern  . Not on file  Social History Narrative  . Not on file   Social Determinants of Health   Financial Resource Strain: Not on file  Food Insecurity: Not on file  Transportation Needs: Not on file  Physical Activity: Not on file  Stress: Not on file  Social Connections: Not on file  Intimate Partner Violence: Not on file        Objective:    BP (!) 143/83   Pulse 86   Temp 97.7 F (36.5 C) (Temporal)   Ht 5' 4"  (1.626 m)   Wt 178 lb (80.7 kg)   SpO2 95%   BMI 30.55 kg/m   Wt Readings from Last 3 Encounters:  06/21/20 178 lb (80.7 kg)  03/01/20 179 lb (81.2 kg)  11/04/19 175 lb 8 oz (79.6 kg)    Physical Exam Vitals reviewed.  Constitutional:      General: She is not in acute distress.    Appearance: Normal appearance. She is obese. She is not ill-appearing, toxic-appearing or diaphoretic.  HENT:     Head: Normocephalic and atraumatic.  Eyes:     General: No scleral icterus.       Right eye: No discharge.        Left eye: No discharge.     Conjunctiva/sclera: Conjunctivae normal.  Cardiovascular:     Rate and Rhythm: Normal rate and regular  rhythm.     Heart sounds: Normal heart sounds. No murmur heard. No friction rub. No gallop.   Pulmonary:     Effort: Pulmonary effort is normal. No respiratory distress.     Breath sounds: Normal breath sounds. No stridor. No wheezing, rhonchi or rales.  Musculoskeletal:        General: Normal range of motion.     Cervical back: Normal range of motion.  Skin:    General: Skin is warm and dry.     Capillary Refill: Capillary refill takes less than 2 seconds.  Neurological:     General: No focal deficit present.     Mental Status: She is alert and oriented to person, place, and time. Mental status is at baseline.  Psychiatric:        Mood and Affect: Mood normal.        Behavior: Behavior normal.        Thought Content: Thought content normal.        Judgment: Judgment normal.     Lab Results  Component Value Date   TSH 1.470 01/26/2019   Lab Results  Component Value Date   WBC 14.4 (H) 01/26/2019   HGB 13.7 01/26/2019   HCT 40.8 01/26/2019   MCV 97 01/26/2019   PLT 222 01/26/2019   Lab Results  Component Value Date   NA 145 (H) 08/25/2019   K 3.3 (L) 08/25/2019   CO2 25 08/25/2019   GLUCOSE 77 08/25/2019   BUN 24 08/25/2019   CREATININE 0.92 08/25/2019   BILITOT 0.4 01/26/2019   ALKPHOS 61 01/26/2019   AST 21 01/26/2019   ALT 15 01/26/2019   PROT 7.0 01/26/2019   ALBUMIN 4.3 01/26/2019   CALCIUM 10.2 08/25/2019   ANIONGAP 8 05/08/2018   Lab Results  Component Value Date   CHOL 146 01/26/2019   Lab Results  Component Value Date   HDL 47 01/26/2019   Lab Results  Component Value Date  [place] 69 01/26/2019   Lab Results  Component Value Date   TRIG 180 (H) 01/26/2019   Lab Results  Component Value Date   CHOLHDL 3.1 01/26/2019   No results found for: HGBA1C

## 2020-06-22 DIAGNOSIS — Z720 Tobacco use: Secondary | ICD-10-CM | POA: Insufficient documentation

## 2020-06-22 DIAGNOSIS — M25562 Pain in left knee: Secondary | ICD-10-CM | POA: Insufficient documentation

## 2020-06-22 DIAGNOSIS — G8929 Other chronic pain: Secondary | ICD-10-CM | POA: Insufficient documentation

## 2020-06-22 DIAGNOSIS — Z79899 Other long term (current) drug therapy: Secondary | ICD-10-CM | POA: Insufficient documentation

## 2020-06-22 LAB — CBC WITH DIFFERENTIAL/PLATELET
Basophils Absolute: 0.1 10*3/uL (ref 0.0–0.2)
Basos: 1 %
EOS (ABSOLUTE): 0.8 10*3/uL — ABNORMAL HIGH (ref 0.0–0.4)
Eos: 5 %
Hematocrit: 43.1 % (ref 34.0–46.6)
Hemoglobin: 14.7 g/dL (ref 11.1–15.9)
Immature Grans (Abs): 0.2 10*3/uL — ABNORMAL HIGH (ref 0.0–0.1)
Immature Granulocytes: 1 %
Lymphocytes Absolute: 4.5 10*3/uL — ABNORMAL HIGH (ref 0.7–3.1)
Lymphs: 29 %
MCH: 33.1 pg — ABNORMAL HIGH (ref 26.6–33.0)
MCHC: 34.1 g/dL (ref 31.5–35.7)
MCV: 97 fL (ref 79–97)
Monocytes Absolute: 1 10*3/uL — ABNORMAL HIGH (ref 0.1–0.9)
Monocytes: 6 %
Neutrophils Absolute: 9.3 10*3/uL — ABNORMAL HIGH (ref 1.4–7.0)
Neutrophils: 58 %
Platelets: 224 10*3/uL (ref 150–450)
RBC: 4.44 x10E6/uL (ref 3.77–5.28)
RDW: 12.1 % (ref 11.7–15.4)
WBC: 15.8 10*3/uL — ABNORMAL HIGH (ref 3.4–10.8)

## 2020-06-22 LAB — LIPID PANEL
Chol/HDL Ratio: 3.3 ratio (ref 0.0–4.4)
Cholesterol, Total: 181 mg/dL (ref 100–199)
HDL: 55 mg/dL (ref >39)
LDL Chol Calc (NIH): 82 mg/dL (ref 0–99)
Triglycerides: 273 mg/dL — ABNORMAL HIGH (ref 0–149)
VLDL Cholesterol Cal: 44 mg/dL — ABNORMAL HIGH (ref 5–40)

## 2020-06-22 LAB — CMP14+EGFR
ALT: 14 IU/L (ref 0–32)
AST: 21 IU/L (ref 0–40)
Albumin/Globulin Ratio: 1.7 (ref 1.2–2.2)
Albumin: 4.3 g/dL (ref 3.7–4.7)
Alkaline Phosphatase: 68 IU/L (ref 44–121)
BUN/Creatinine Ratio: 18 (ref 12–28)
BUN: 16 mg/dL (ref 8–27)
Bilirubin Total: 0.4 mg/dL (ref 0.0–1.2)
CO2: 30 mmol/L — ABNORMAL HIGH (ref 20–29)
Calcium: 9.9 mg/dL (ref 8.7–10.3)
Chloride: 100 mmol/L (ref 96–106)
Creatinine, Ser: 0.88 mg/dL (ref 0.57–1.00)
GFR calc Af Amer: 74 mL/min/{1.73_m2} (ref >59)
GFR calc non Af Amer: 64 mL/min/{1.73_m2} (ref >59)
Globulin, Total: 2.5 g/dL (ref 1.5–4.5)
Glucose: 82 mg/dL (ref 65–99)
Potassium: 4.4 mmol/L (ref 3.5–5.2)
Sodium: 144 mmol/L (ref 134–144)
Total Protein: 6.8 g/dL (ref 6.0–8.5)

## 2020-06-22 LAB — TSH: TSH: 1.27 u[IU]/mL (ref 0.450–4.500)

## 2020-06-26 ENCOUNTER — Ambulatory Visit: Payer: Medicare HMO | Admitting: Orthopedic Surgery

## 2020-06-29 ENCOUNTER — Ambulatory Visit (INDEPENDENT_AMBULATORY_CARE_PROVIDER_SITE_OTHER): Payer: Medicare HMO | Admitting: Orthopedic Surgery

## 2020-06-29 ENCOUNTER — Other Ambulatory Visit: Payer: Self-pay

## 2020-06-29 ENCOUNTER — Encounter: Payer: Self-pay | Admitting: Orthopedic Surgery

## 2020-06-29 VITALS — BP 156/69 | HR 99 | Ht 64.0 in | Wt 178.0 lb

## 2020-06-29 DIAGNOSIS — M7052 Other bursitis of knee, left knee: Secondary | ICD-10-CM | POA: Diagnosis not present

## 2020-06-29 DIAGNOSIS — M25561 Pain in right knee: Secondary | ICD-10-CM | POA: Diagnosis not present

## 2020-06-29 DIAGNOSIS — M25562 Pain in left knee: Secondary | ICD-10-CM

## 2020-06-29 DIAGNOSIS — G8929 Other chronic pain: Secondary | ICD-10-CM

## 2020-06-29 DIAGNOSIS — M7051 Other bursitis of knee, right knee: Secondary | ICD-10-CM | POA: Diagnosis not present

## 2020-06-29 LAB — COMPLIANCE DRUG ANALYSIS, UR

## 2020-06-29 NOTE — Progress Notes (Signed)
Chief Complaint  Patient presents with  . Injections    Bilateral knee injections     75 years old request bilateral knee injections for joint injections and bursal injections for osteoarthritis and pes bursitis  Encounter Diagnoses  Name Primary?  . Chronic pain of right knee Yes  . Chronic pain of left knee   . Pes anserinus bursitis of right knee   . Pes anserinus bursitis of left knee     Procedure note for bilateral knee injections  Procedure note left knee injection verbal consent was obtained to inject left knee joint  Timeout was completed to confirm the site of injection  The medications used were 40 mg of Depo-Medrol and 1% lidocaine 3 cc  Anesthesia was provided by ethyl chloride and the skin was prepped with alcohol.  After cleaning the skin with alcohol a 20-gauge needle was used to inject the left knee joint. There were no complications. A sterile bandage was applied.   Procedure note right knee injection verbal consent was obtained to inject right knee joint  Timeout was completed to confirm the site of injection  The medications used were 40 mg of Depo-Medrol and 1% lidocaine 3 cc  Anesthesia was provided by ethyl chloride and the skin was prepped with alcohol.  After cleaning the skin with alcohol a 20-gauge needle was used to inject the right knee joint. There were no complications. A sterile bandage was applied.  Procedure note Left knee injection for bursitis   verbal consent was obtained to inject Left knee PES BURSA  Timeout was completed to confirm the site of injection  The medications used were 40 mg of Depo-Medrol and 1% lidocaine 3 cc  Anesthesia was provided by ethyl chloride and the skin was prepped with alcohol.  After cleaning the skin with alcohol a 25-gauge needle was used to inject the left knee bursa   There were no complications and a sterile bandage was applied  Procedure note right knee injection for bursitis   verbal consent  was obtained to inject right knee PES BURSA  Timeout was completed to confirm the site of injection  The medications used were 40 mg of Depo-Medrol and 1% lidocaine 3 cc  Anesthesia was provided by ethyl chloride and the skin was prepped with alcohol.  After cleaning the skin with alcohol a 25-gauge needle was used to inject the right knee bursa.  There were no complications and a sterile bandage was applied

## 2020-08-17 ENCOUNTER — Other Ambulatory Visit: Payer: Self-pay | Admitting: Family Medicine

## 2020-08-17 DIAGNOSIS — Z79899 Other long term (current) drug therapy: Secondary | ICD-10-CM

## 2020-08-17 DIAGNOSIS — M159 Polyosteoarthritis, unspecified: Secondary | ICD-10-CM

## 2020-08-17 DIAGNOSIS — M8949 Other hypertrophic osteoarthropathy, multiple sites: Secondary | ICD-10-CM

## 2020-08-17 DIAGNOSIS — M25561 Pain in right knee: Secondary | ICD-10-CM

## 2020-08-17 DIAGNOSIS — G8929 Other chronic pain: Secondary | ICD-10-CM

## 2020-09-09 IMAGING — DX DG CHEST 2V
2 series · 2 of 2 positions shown · non-contrast
Comparison: None.

CLINICAL DATA: Hypoxia.

EXAM:
CHEST - 2 VIEW

[chest ap]
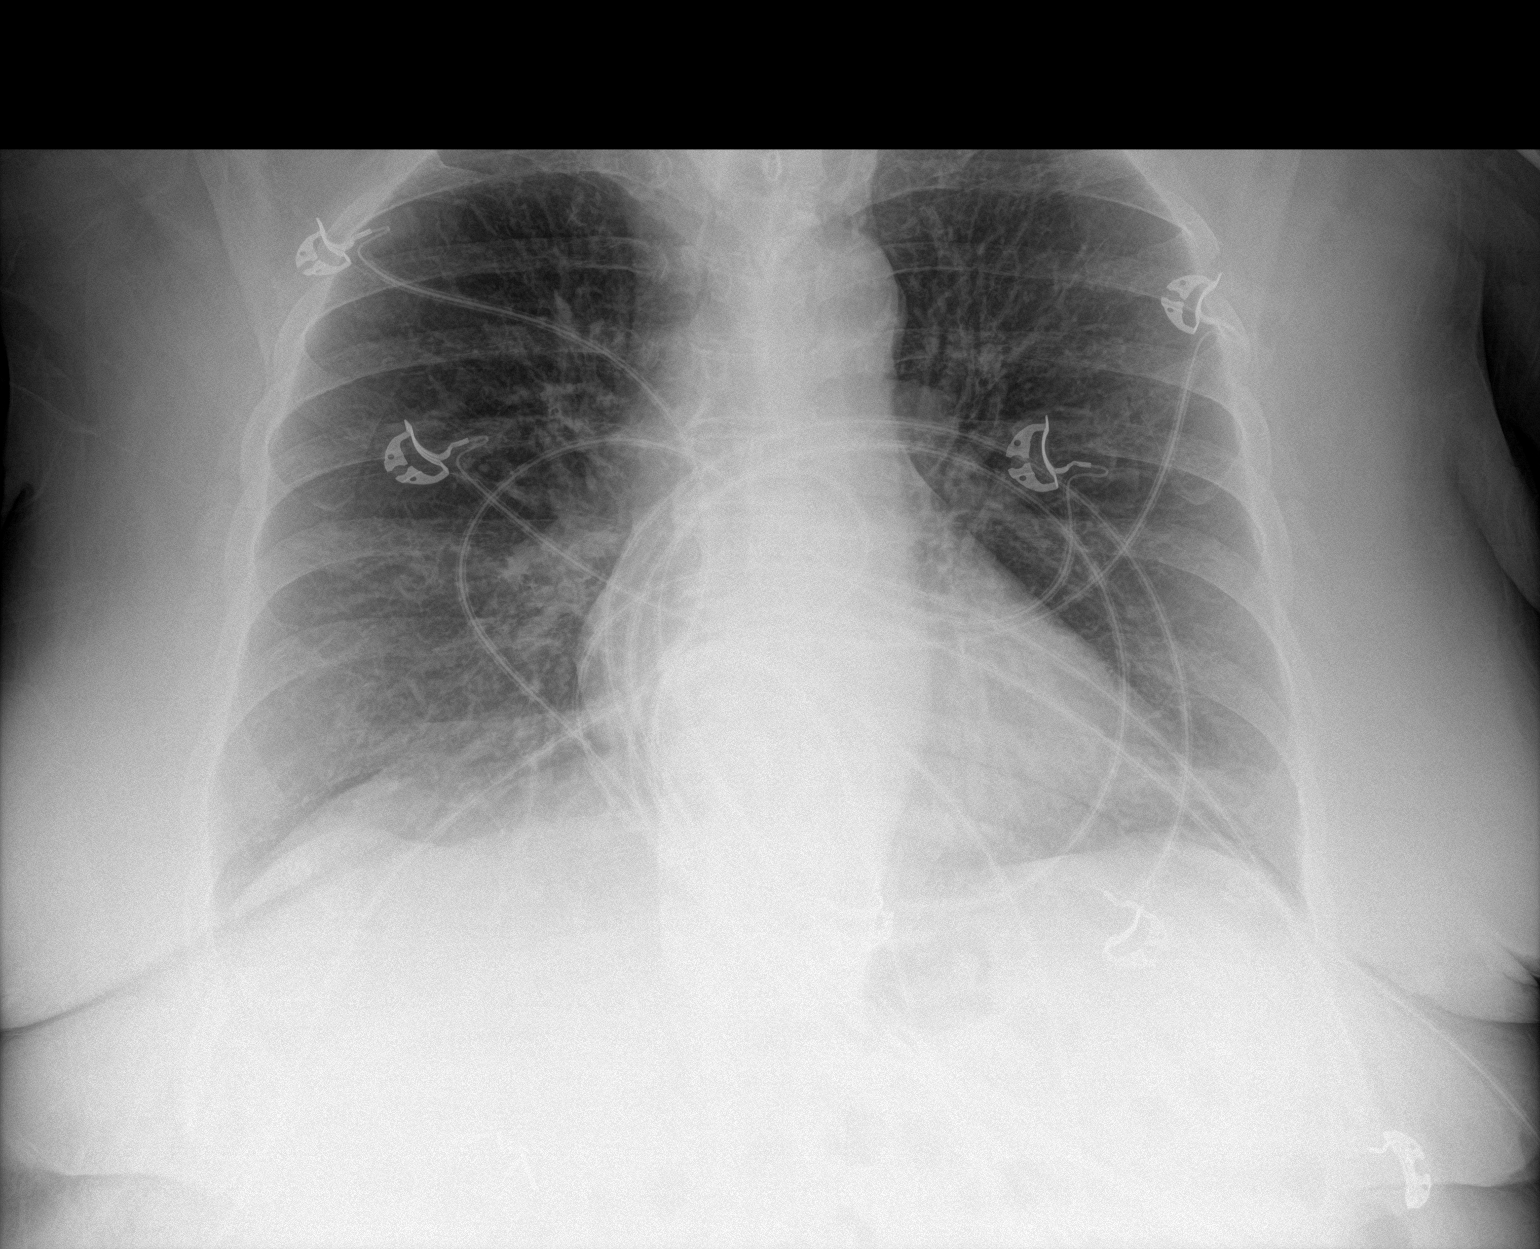

[chest lat]
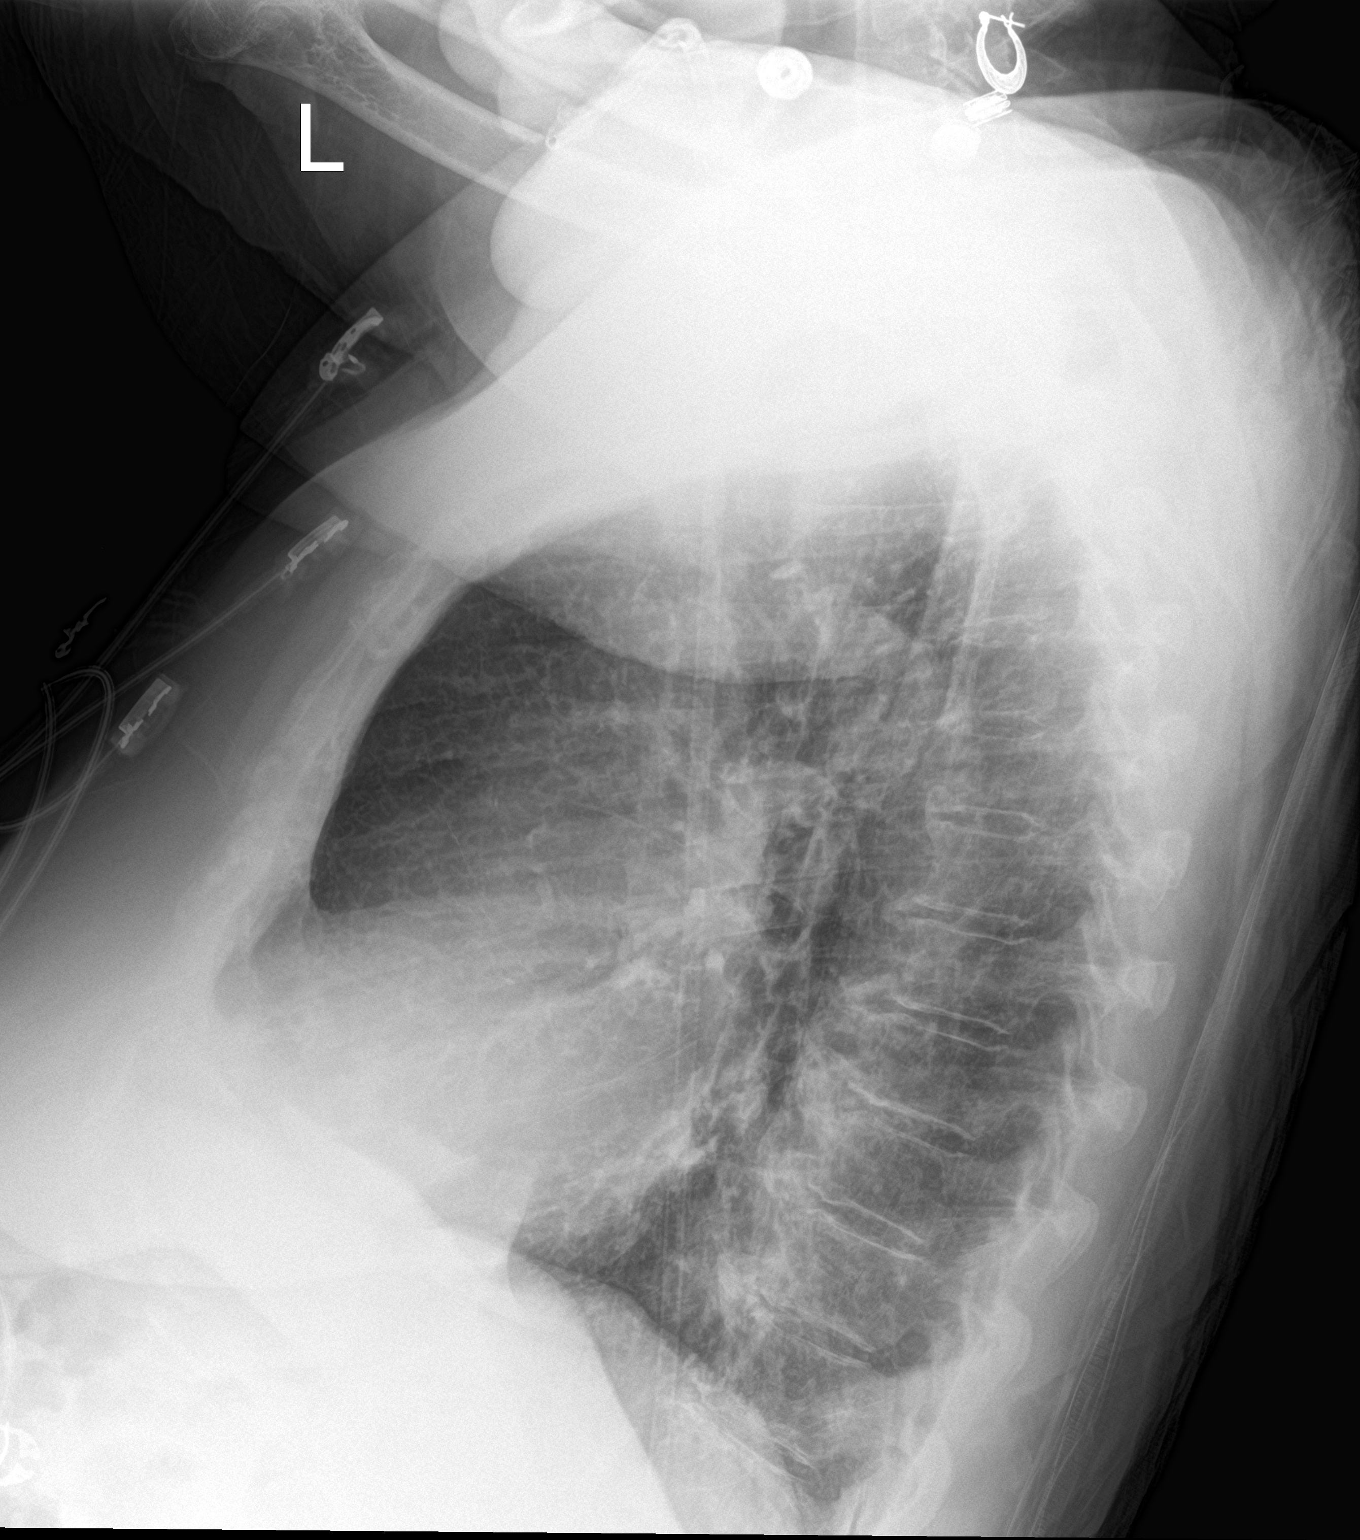

[2 of 2 positions shown; findings below may reference images not displayed]

FINDINGS: The heart size and mediastinal contours are within normal limits.
Both lungs are clear. The visualized skeletal structures are
unremarkable.
IMPRESSION: No active cardiopulmonary disease.

## 2020-09-10 IMAGING — CR DG FOOT COMPLETE 3+V*R*
1 series · 3 of 3 positions shown · non-contrast
Comparison: None.

CLINICAL DATA: Foot pain.

EXAM:
RIGHT FOOT COMPLETE - 3+ VIEW

[Series 1: ap · 0.17mm/px · 3 of 3 slices shown]
[im 1/3]
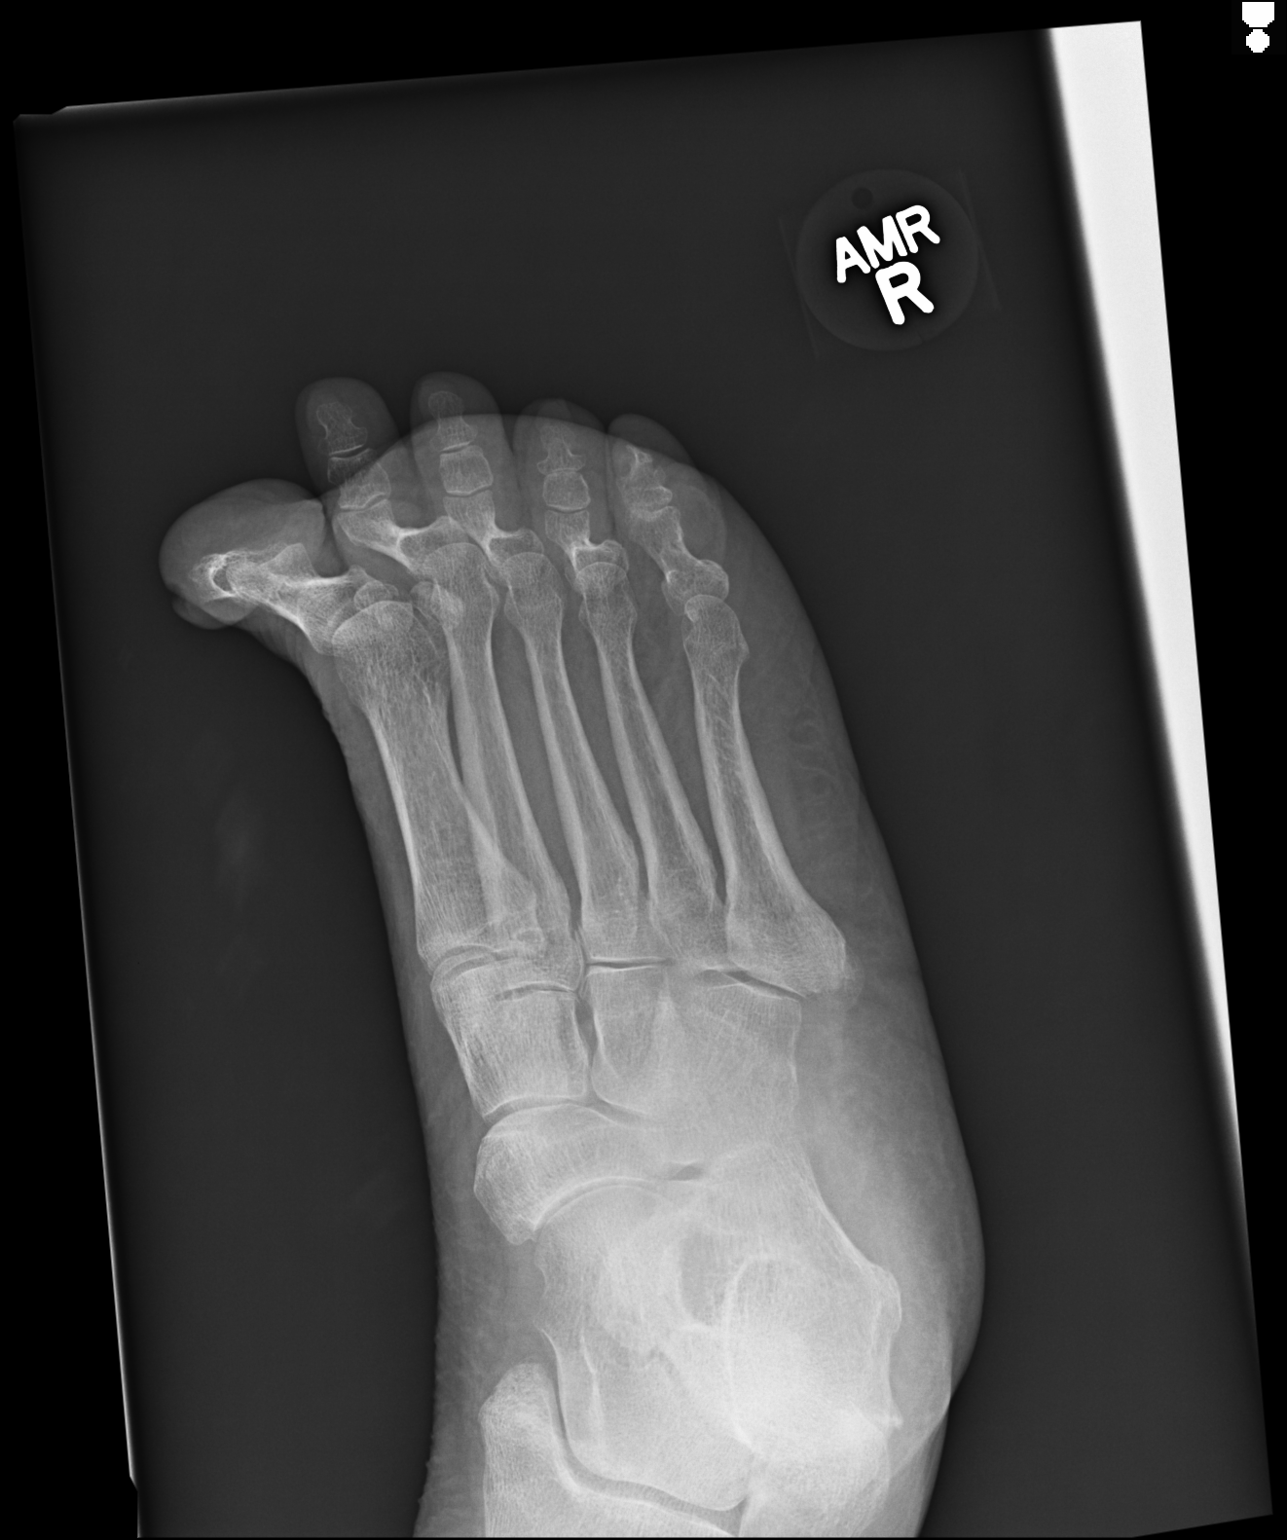
[im 2/3]
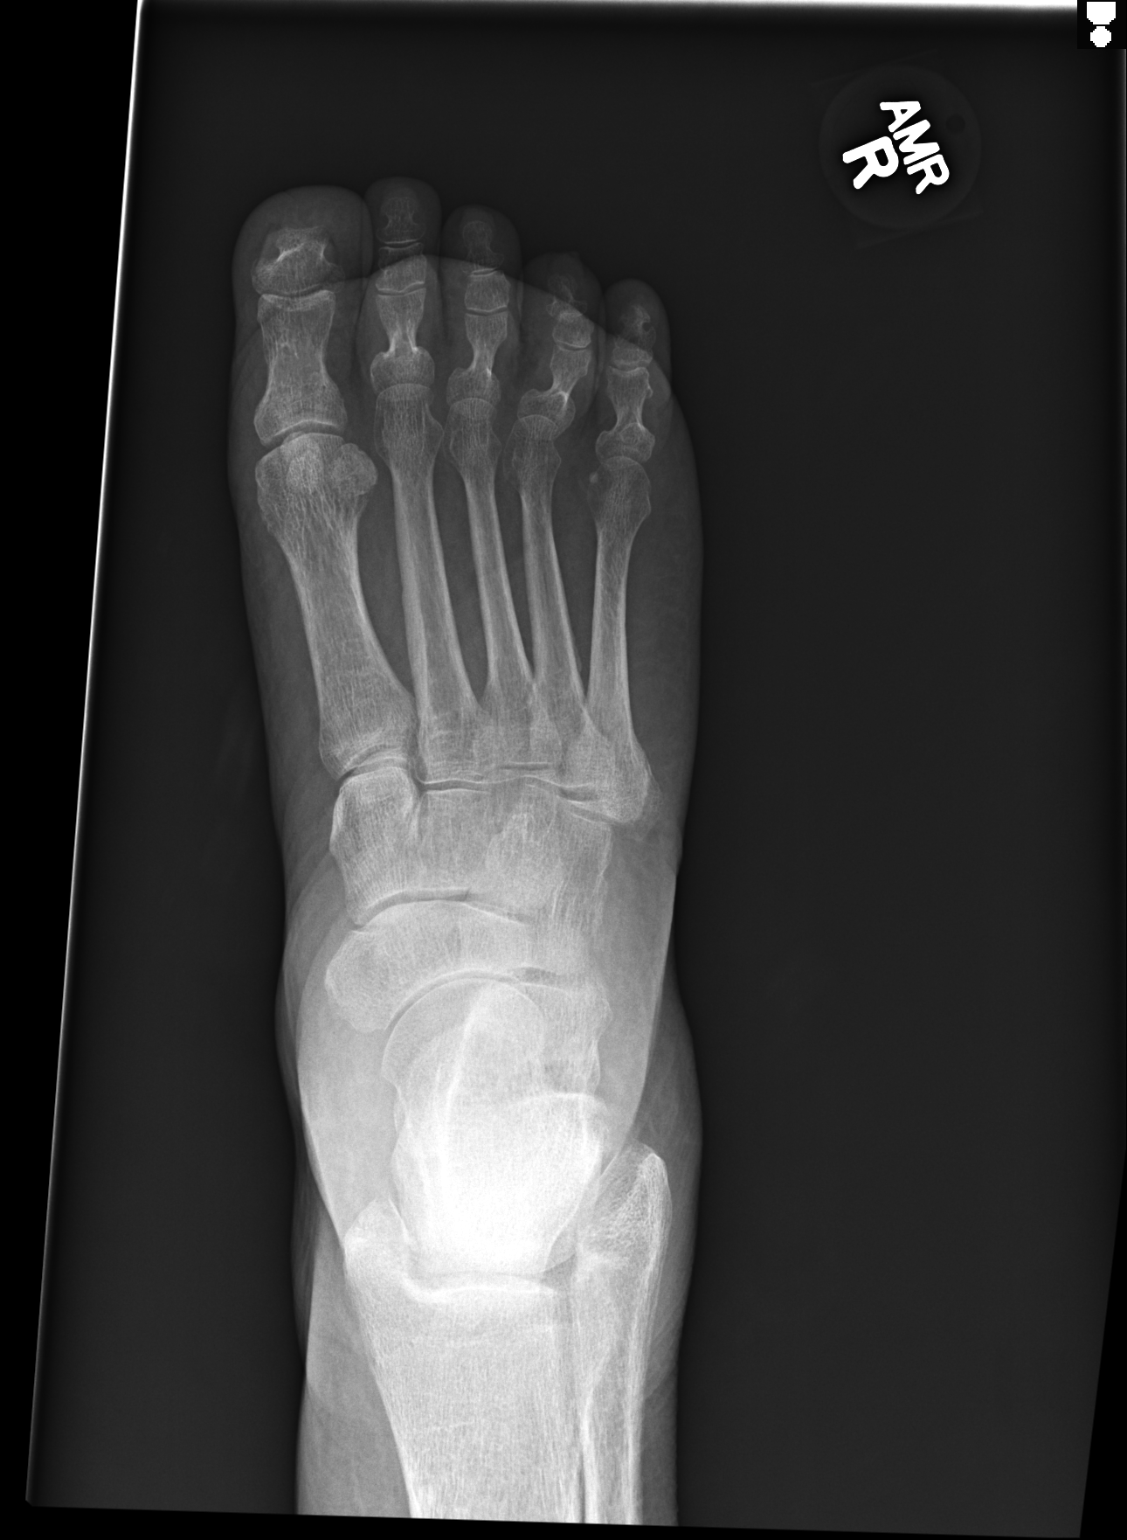
[im 3/3]
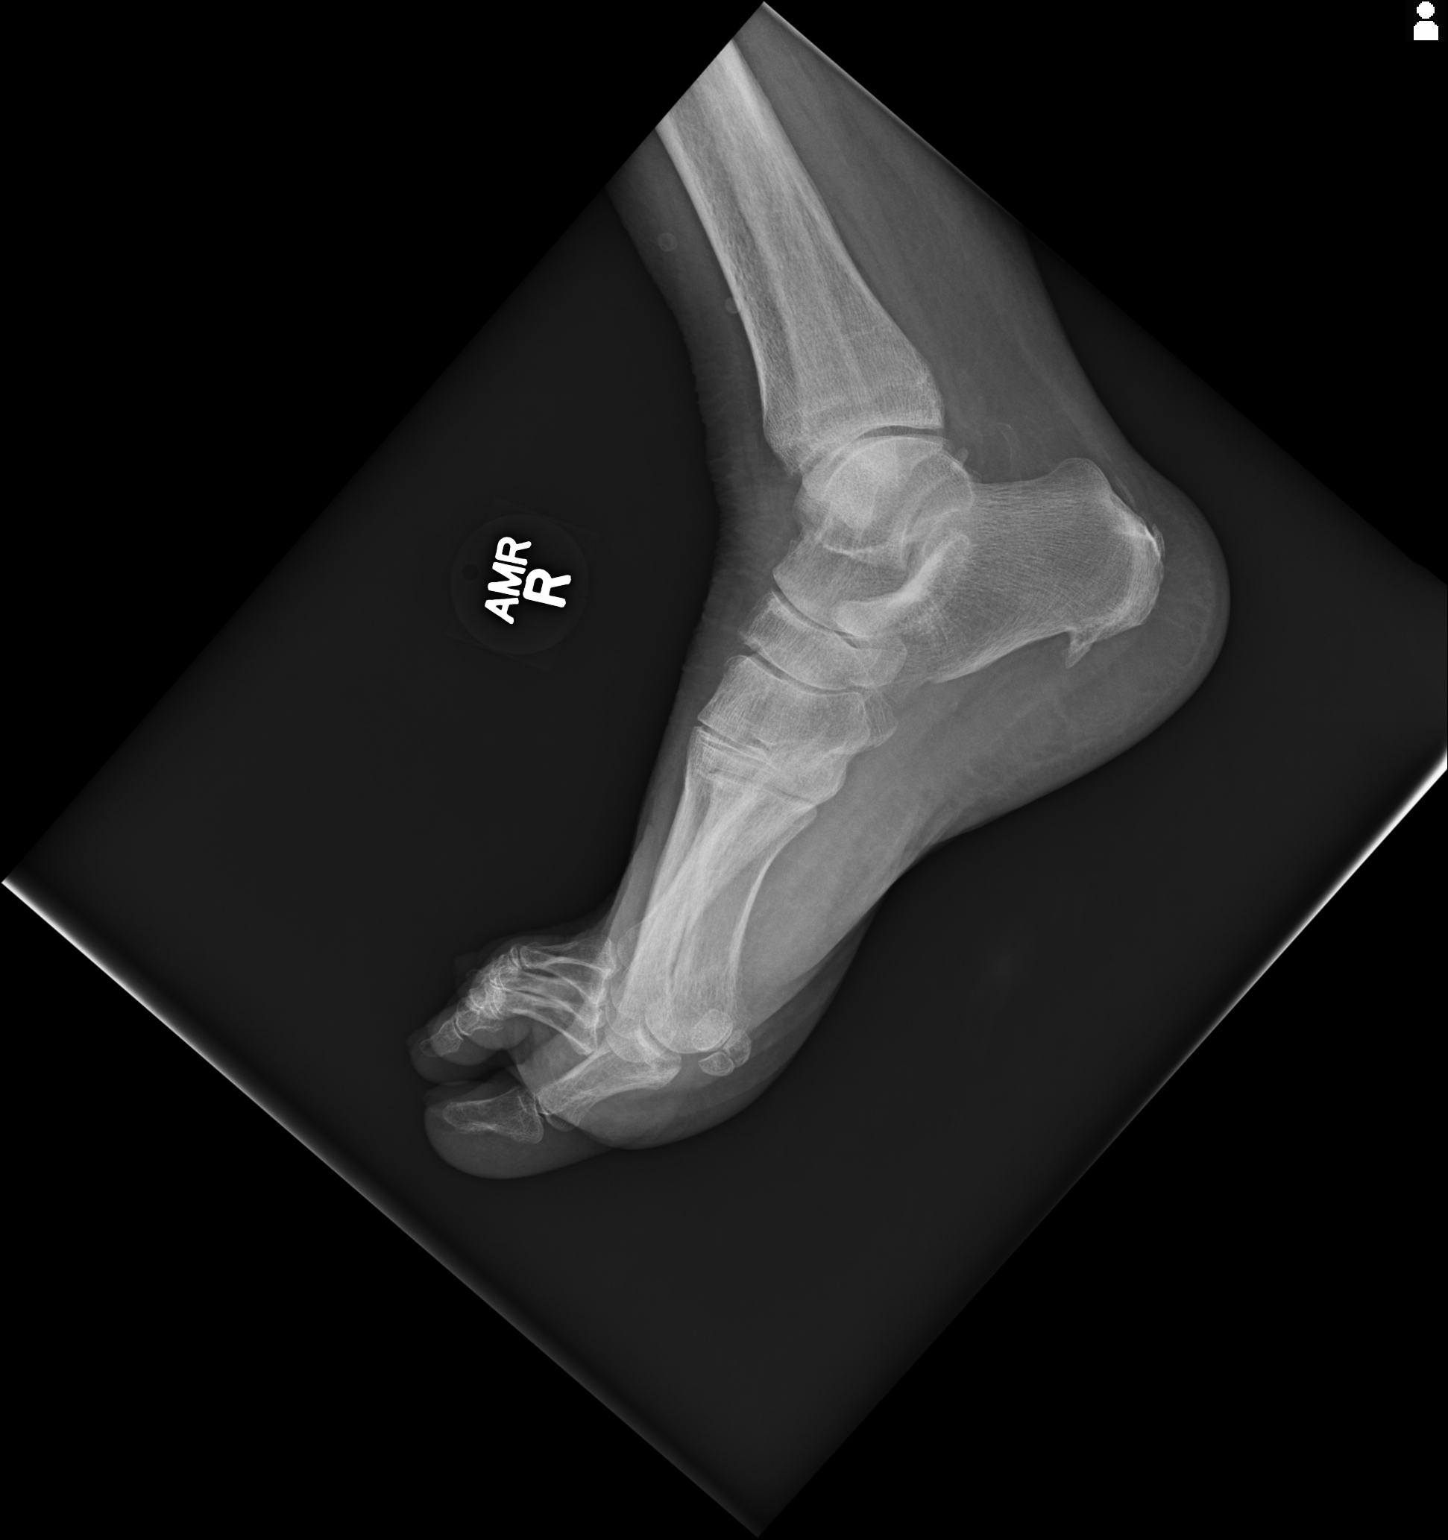

[3 of 3 positions shown; findings below may reference images not displayed]

FINDINGS: The bones appear osteopenic. There are no acute fractures or
dislocations identified. Plantar heel spur noted. Mild diffuse soft
tissue edema.
IMPRESSION: 1. No acute bone abnormality.
2. Mild soft tissue edema.
3. Plantar heel spur

## 2020-09-14 ENCOUNTER — Ambulatory Visit: Payer: Medicare HMO | Admitting: Family Medicine

## 2020-09-21 ENCOUNTER — Other Ambulatory Visit: Payer: Self-pay | Admitting: Family Medicine

## 2020-09-21 ENCOUNTER — Telehealth: Payer: Self-pay

## 2020-09-21 DIAGNOSIS — Z79899 Other long term (current) drug therapy: Secondary | ICD-10-CM

## 2020-09-21 DIAGNOSIS — G8929 Other chronic pain: Secondary | ICD-10-CM

## 2020-09-21 DIAGNOSIS — M8949 Other hypertrophic osteoarthropathy, multiple sites: Secondary | ICD-10-CM

## 2020-09-21 DIAGNOSIS — M25562 Pain in left knee: Secondary | ICD-10-CM

## 2020-09-21 DIAGNOSIS — M159 Polyosteoarthritis, unspecified: Secondary | ICD-10-CM

## 2020-09-21 NOTE — Telephone Encounter (Signed)
Lmtcb.

## 2020-09-21 NOTE — Telephone Encounter (Signed)
Appointment scheduled.

## 2020-09-26 ENCOUNTER — Encounter: Payer: Self-pay | Admitting: Family Medicine

## 2020-09-26 ENCOUNTER — Ambulatory Visit (INDEPENDENT_AMBULATORY_CARE_PROVIDER_SITE_OTHER): Payer: Medicare HMO | Admitting: Family Medicine

## 2020-09-26 ENCOUNTER — Other Ambulatory Visit: Payer: Self-pay

## 2020-09-26 VITALS — BP 135/80 | HR 104 | Temp 97.5°F | Resp 20 | Ht 64.0 in | Wt 177.0 lb

## 2020-09-26 DIAGNOSIS — R062 Wheezing: Secondary | ICD-10-CM

## 2020-09-26 DIAGNOSIS — Z79899 Other long term (current) drug therapy: Secondary | ICD-10-CM | POA: Diagnosis not present

## 2020-09-26 DIAGNOSIS — Z86010 Personal history of colonic polyps: Secondary | ICD-10-CM

## 2020-09-26 DIAGNOSIS — Z748 Other problems related to care provider dependency: Secondary | ICD-10-CM

## 2020-09-26 DIAGNOSIS — M8949 Other hypertrophic osteoarthropathy, multiple sites: Secondary | ICD-10-CM | POA: Diagnosis not present

## 2020-09-26 DIAGNOSIS — Z1211 Encounter for screening for malignant neoplasm of colon: Secondary | ICD-10-CM | POA: Diagnosis not present

## 2020-09-26 DIAGNOSIS — I10 Essential (primary) hypertension: Secondary | ICD-10-CM | POA: Diagnosis not present

## 2020-09-26 DIAGNOSIS — G8929 Other chronic pain: Secondary | ICD-10-CM

## 2020-09-26 DIAGNOSIS — M159 Polyosteoarthritis, unspecified: Secondary | ICD-10-CM

## 2020-09-26 NOTE — Progress Notes (Signed)
Assessment & Plan:  1-2. Primary osteoarthritis involving multiple joints/Controlled substance agreement signed Well controlled on current regimen.  Controlled substance agreement updated today.  Urine drug screen as expected in February.  PDMP reviewed with no concerning findings. - traMADol HCl 100 MG TABS; Take 100 mg by mouth 2 (two) times daily as needed.  Dispense: 60 tablet; Refill: 2  3. Assistance needed for housework - AMB Referral to [facility] Coordinaton  4. Essential hypertension Well controlled on current regimen.   5. Wheezing Well controlled on current regimen.   6. Colon cancer screening - Ambulatory referral to Gastroenterology  7. History of colon polyps - Ambulatory referral to Gastroenterology   Return in about 3 months (around 12/27/2020) for annual physical.  Denise Boston, MSN, APRN, FNP-C [facility]  Subjective:    Patient ID: Denise Underwood, female    DOB: [birth year], 75 y.o.   MRN: [id number]  Patient Care Team: Gwenlyn Fudge, FNP as PCP - General (Family Medicine) Magrinat, Valentino Hue, MD as Consulting Physician (Oncology) Toma Deiters, MD (Internal Medicine) Michae Kava, MD as Consulting Physician (Internal Medicine)   Chief Complaint:  Chief Complaint  Patient presents with  . Medical Management of Chronic Issues    HPI: Denise Underwood is a 75 y.o. female presenting on 09/26/2020 for Medical Management of Chronic Issues  Pain assessment: Cause of pain- osteoarthritis Pain location- both knees Pain on scale of 1-10- 9/10 without medication, 3-4/10 with medication Frequency- daily What increases pain- standing and walking What makes pain better- sitting, medication Effects on ADL- makes it very difficult. Patient is requesting that someone come into the home to help with things like vacuuming, laundry, and meals. She would also like them to help her get to the store when needed.  Any change in  general medical condition- none  Current opioids rx- Tramadol 100 mg BID PRN # meds rx- 60 Effectiveness of current meds- good Adverse reactions from pain meds- none Morphine equivalent- 20 MME/day  Pill count performed-No Last drug screen - 06/21/2020 ( high risk q36m, moderate risk q60m, low risk yearly ) Urine drug screen today- No Was the NCCSR reviewed- Yes  If yes were their any concerning findings? - No  Overdose risk: 150  Opioid Risk  06/22/2020  Alcohol 0  Illegal Drugs 0  Rx Drugs 0  Alcohol 0  Illegal Drugs 0  Rx Drugs 0  Age between 16-45 years  0  History of Preadolescent Sexual Abuse 0  Psychological Disease 0  Depression 0  Opioid Risk Tool Scoring 0  Opioid Risk Interpretation Low Risk   Pain contract signed on: updated today.   Wheezing: given an Albuterol inhaler last visit.  States she has used it 4-5 times and it is helpful.  Knee osteoarthritis: Patient states knee injections given by her orthopedic did not help at all this past time.   New complaints: None  Social history:  Relevant past medical, surgical, family and social history reviewed and updated as indicated. Interim medical history since our last visit reviewed.  Allergies and medications reviewed and updated.  DATA REVIEWED: CHART IN EPIC  ROS: Negative unless specifically indicated above in HPI.    Current Outpatient Medications:  .  albuterol (VENTOLIN HFA) 108 (90 Base) MCG/ACT inhaler, Inhale 2 puffs into the lungs every 6 (six) hours as needed., Disp: 18 g, Rfl: 2 .  Cholecalciferol (VITAMIN D3) 1000 units CAPS, Take by mouth., Disp: , Rfl:  .  diclofenac Sodium (VOLTAREN) 1 % GEL, Apply 2 g topically 4 (four) times daily., Disp: 350 g, Rfl: 2 .  escitalopram (LEXAPRO) 20 MG tablet, Take 1 tablet (20 mg total) by mouth daily., Disp: 90 tablet, Rfl: 1 .  hydrochlorothiazide (HYDRODIURIL) 25 MG tablet, TAKE (1) TABLET BY MOUTH ONCE DAILY AS NEEDED., Disp: 90 tablet, Rfl: 1 .   hydrocortisone 2.5 % cream, Apply topically 2 (two) times daily., Disp: 453.6 g, Rfl: 2 .  nystatin cream (MYCOSTATIN), Apply 1 application topically 2 (two) times daily., Disp: 30 g, Rfl: 1 .  rosuvastatin (CRESTOR) 20 MG tablet, Take 1 tablet (20 mg total) by mouth daily., Disp: 90 tablet, Rfl: 1 .  traMADol HCl 100 MG TABS, Take 100 mg by mouth 2 (two) times daily as needed., Disp: 60 tablet, Rfl: 2   No Known Allergies Past Medical History:  Diagnosis Date  . Anxiety   . Arthritis   . Hyperlipidemia   . Hypertension   . Psoriasis     Past Surgical History:  Procedure Laterality Date  . ABDOMINAL HYSTERECTOMY    . CHOLECYSTECTOMY    . HERNIA REPAIR    . KNEE SURGERY    . TUBAL LIGATION      Social History   Socioeconomic History  . Marital status: Single    Spouse name: Not on file  . Number of children: 2  . Years of education: Not on file  . Highest education level: 9th grade  Occupational History  . Occupation: Retired  Tobacco Use  . Smoking status: Former Smoker    Types: Cigarettes    Quit date: 01/11/2018    Years since quitting: 2.7  . Smokeless tobacco: Never Used  Vaping Use  . Vaping Use: Never used  Substance and Sexual Activity  . Alcohol use: No  . Drug use: No  . Sexual activity: Not Currently    Birth control/protection: Surgical  Other Topics Concern  . Not on file  Social History Narrative  . Not on file   Social Determinants of Health   Financial Resource Strain: Not on file  Food Insecurity: Not on file  Transportation Needs: Not on file  Physical Activity: Not on file  Stress: Not on file  Social Connections: Not on file  Intimate Partner Violence: Not on file        Objective:    BP 135/80   Pulse (!) 104   Temp (!) 97.5 F (36.4 C)   Resp 20   Ht 5\' 4"  (1.626 m)   Wt 177 lb (80.3 kg)   SpO2 93%   BMI 30.38 kg/m   Wt Readings from Last 3 Encounters:  09/26/20 177 lb (80.3 kg)  06/29/20 178 lb (80.7 kg)  06/21/20 178  lb (80.7 kg)    Physical Exam Vitals reviewed.  Constitutional:      General: She is not in acute distress.    Appearance: Normal appearance. She is obese. She is not ill-appearing, toxic-appearing or diaphoretic.  HENT:     Head: Normocephalic and atraumatic.  Eyes:     General: No scleral icterus.       Right eye: No discharge.        Left eye: No discharge.     Conjunctiva/sclera: Conjunctivae normal.  Cardiovascular:     Rate and Rhythm: Normal rate and regular rhythm.     Heart sounds: Normal heart sounds. No murmur heard. No friction rub. No gallop.   Pulmonary:  Effort: Pulmonary effort is normal. No respiratory distress.     Breath sounds: Normal breath sounds. No stridor. No wheezing, rhonchi or rales.  Musculoskeletal:        General: Normal range of motion.     Cervical back: Normal range of motion.  Skin:    General: Skin is warm and dry.     Capillary Refill: Capillary refill takes less than 2 seconds.  Neurological:     General: No focal deficit present.     Mental Status: She is alert and oriented to person, place, and time. Mental status is at baseline.  Psychiatric:        Mood and Affect: Mood normal.        Behavior: Behavior normal.        Thought Content: Thought content normal.        Judgment: Judgment normal.     Lab Results  Component Value Date   TSH 1.270 06/21/2020   Lab Results  Component Value Date   WBC 15.8 (H) 06/21/2020   HGB 14.7 06/21/2020   HCT 43.1 06/21/2020   MCV 97 06/21/2020   PLT 224 06/21/2020   Lab Results  Component Value Date   NA 144 06/21/2020   K 4.4 06/21/2020   CO2 30 (H) 06/21/2020   GLUCOSE 82 06/21/2020   BUN 16 06/21/2020   CREATININE 0.88 06/21/2020   BILITOT 0.4 06/21/2020   ALKPHOS 68 06/21/2020   AST 21 06/21/2020   ALT 14 06/21/2020   PROT 6.8 06/21/2020   ALBUMIN 4.3 06/21/2020   CALCIUM 9.9 06/21/2020   ANIONGAP 8 05/08/2018   Lab Results  Component Value Date   CHOL 181 06/21/2020    Lab Results  Component Value Date   HDL 55 06/21/2020   Lab Results  Component Value Date   LDLCALC 82 06/21/2020   Lab Results  Component Value Date   TRIG 273 (H) 06/21/2020   Lab Results  Component Value Date   CHOLHDL 3.3 06/21/2020   No results found for: HGBA1C

## 2020-09-27 ENCOUNTER — Telehealth: Payer: Self-pay | Admitting: Family Medicine

## 2020-09-27 ENCOUNTER — Other Ambulatory Visit: Payer: Self-pay | Admitting: Family Medicine

## 2020-09-27 DIAGNOSIS — M150 Primary generalized (osteo)arthritis: Secondary | ICD-10-CM

## 2020-09-27 DIAGNOSIS — M8949 Other hypertrophic osteoarthropathy, multiple sites: Secondary | ICD-10-CM

## 2020-09-27 DIAGNOSIS — G8929 Other chronic pain: Secondary | ICD-10-CM

## 2020-09-27 DIAGNOSIS — M159 Polyosteoarthritis, unspecified: Secondary | ICD-10-CM

## 2020-09-27 DIAGNOSIS — Z79899 Other long term (current) drug therapy: Secondary | ICD-10-CM

## 2020-09-27 MED ORDER — TRAMADOL HCL 100 MG PO TABS: 100.0000 mg | ORAL_TABLET | Freq: Two times a day (BID) | ORAL | 2 refills | Status: DC | PRN

## 2020-09-27 NOTE — Telephone Encounter (Signed)
Done

## 2020-10-02 ENCOUNTER — Telehealth: Payer: Self-pay | Admitting: Family Medicine

## 2020-10-02 ENCOUNTER — Encounter: Payer: Self-pay | Admitting: Family Medicine

## 2020-10-02 NOTE — Telephone Encounter (Signed)
   Telephone encounter was:  Unsuccessful.  10/02/2020 Name: Denise Underwood MRN: [id number] DOB: 06/30/1945  Unsuccessful outbound call made today to assist with:  Transportation Needs  and in home care/house keeping  Outreach Attempt:  1st Attempt  A HIPAA compliant voice message was left requesting a return call.  Instructed patient to call back at [phone].  Rojelio Brenner Care Guide, Embedded Care Coordination [facility], Care Management Phone: [phone] Email: [email or web address]

## 2020-10-03 ENCOUNTER — Ambulatory Visit: Payer: Medicare HMO | Admitting: Family Medicine

## 2020-10-04 ENCOUNTER — Encounter (INDEPENDENT_AMBULATORY_CARE_PROVIDER_SITE_OTHER): Payer: Self-pay | Admitting: *Deleted

## 2020-10-11 ENCOUNTER — Telehealth: Payer: Self-pay | Admitting: Family Medicine

## 2020-10-11 NOTE — Telephone Encounter (Signed)
   Telephone encounter was:  Successful.  10/11/2020 Name: Denise Underwood MRN: [id number] DOB: January 28, 1946  Denise Underwood is a 75 y.o. year old female who is a primary care patient of Gwenlyn Fudge, FNP . The community resource team was consulted for assistance with Transportation Needs  and in home care  Care guide performed the following interventions: Patient provided with information about care guide support team and interviewed to confirm resource needs Discussed resources to assist with transportation, in home care. Sent [name] an email with transportation options and her mother's insurance to make it easier for her to make transportation requests. Also discussed over the phone an email about [facility] potentially helping with in home care.  Obtained verbal consent to place patient referral to [facility]. Placed referral to [facility] with [facility] via email.  Follow Up Plan:  No further follow up planned at this time. The patient has been provided with needed resources.  Rojelio Brenner Care Guide, Embedded Care Coordination [facility], Care Management Phone: [phone] Email: [email or web address]

## 2020-10-19 ENCOUNTER — Telehealth: Payer: Self-pay | Admitting: Family Medicine

## 2020-10-19 NOTE — Telephone Encounter (Signed)
Daughter returned missed call and said that she can wait until Denise Underwood returns to write letter.

## 2020-10-19 NOTE — Telephone Encounter (Signed)
Lmtcb   Can this wait until PCP comes back in office on 6/14?

## 2020-10-24 NOTE — Telephone Encounter (Signed)
Letter placed up front  Daughter aware

## 2020-10-24 NOTE — Telephone Encounter (Signed)
I am okay with a note being written that [name] is her caregiver.

## 2020-10-30 ENCOUNTER — Other Ambulatory Visit: Payer: Self-pay | Admitting: Radiology

## 2020-10-30 MED ORDER — MELOXICAM 7.5 MG PO TABS
7.5000 mg | ORAL_TABLET | Freq: Every day | ORAL | 2 refills | Status: DC
Start: 2020-10-30 — End: 2021-01-29

## 2020-11-02 ENCOUNTER — Ambulatory Visit (INDEPENDENT_AMBULATORY_CARE_PROVIDER_SITE_OTHER): Payer: Medicare HMO

## 2020-11-02 VITALS — Ht 64.0 in | Wt 177.0 lb

## 2020-11-02 DIAGNOSIS — Z0000 Encounter for general adult medical examination without abnormal findings: Secondary | ICD-10-CM | POA: Diagnosis not present

## 2020-11-02 NOTE — Patient Instructions (Signed)
Denise Underwood , Thank you for taking time to come for your Medicare Wellness Visit. I appreciate your ongoing commitment to your health goals. Please review the following plan we discussed and let me know if I can assist you in the future.   Screening recommendations/referrals: Colonoscopy: Done 12/09/2012 - Repeat in 5 years (ordered - waiting for appt) Mammogram: No longer required Bone Density: Due *next office visit Recommended yearly ophthalmology/optometry visit for glaucoma screening and checkup Recommended yearly dental visit for hygiene and checkup  Vaccinations: Influenza vaccine: Done 03/01/2020 - Repeat annually Pneumococcal vaccine: Done 05/20/2018 & 03/01/2019 Tdap vaccine: Due (every 10 years) Shingles vaccine: Due    Covid-19:Done 01/18/20 & 02/15/20  Conditions/risks identified: Aim for 30 minutes of exercise or brisk walking each day, drink 6-8 glasses of water and eat lots of fruits and vegetables.  If you wish to quit smoking, help is available. For free tobacco cessation program offerings call the [facility] at (214)003-1133 or Live Well Line at (641)782-7512. You may also visit www.[facility].com or email livelifewell@[email or web address] .com for more information on other programs.   You may also call 1-800-QUIT-NOW (434-719-2267) or visit www.[facility].ch or www.[facility].org for additional resources on smoking cessation.   Next appointment: Follow up in one year for your annual wellness visit    Preventive Care 65 Years and Older, Female Preventive care refers to lifestyle choices and visits with your health care provider that can promote health and wellness. What does preventive care include? A yearly physical exam. This is also called an annual well check. Dental exams once or twice a year. Routine eye exams. Ask your health care provider how often you should have your eyes checked. Personal lifestyle choices, including: Daily care of your teeth and  gums. Regular physical activity. Eating a healthy diet. Avoiding tobacco and drug use. Limiting alcohol use. Practicing safe sex. Taking low-dose aspirin every day. Taking vitamin and mineral supplements as recommended by your health care provider. What happens during an annual well check? The services and screenings done by your health care provider during your annual well check will depend on your age, overall health, lifestyle risk factors, and family history of disease. Counseling  Your health care provider may ask you questions about your: Alcohol use. Tobacco use. Drug use. Emotional well-being. Home and relationship well-being. Sexual activity. Eating habits. History of falls. Memory and ability to understand (cognition). Work and work Astronomer. Reproductive health. Screening  You may have the following tests or measurements: Height, weight, and BMI. Blood pressure. Lipid and cholesterol levels. These may be checked every 5 years, or more frequently if you are over 73 years old. Skin check. Lung cancer screening. You may have this screening every year starting at age 69 if you have a 30-pack-year history of smoking and currently smoke or have quit within the past 15 years. Fecal occult blood test (FOBT) of the stool. You may have this test every year starting at age 28. Flexible sigmoidoscopy or colonoscopy. You may have a sigmoidoscopy every 5 years or a colonoscopy every 10 years starting at age 46. Hepatitis C blood test. Hepatitis B blood test. Sexually transmitted disease (STD) testing. Diabetes screening. This is done by checking your blood sugar (glucose) after you have not eaten for a while (fasting). You may have this done every 1-3 years. Bone density scan. This is done to screen for osteoporosis. You may have this done starting at age 3. Mammogram. This may be done every 1-2 years.  Talk to your health care provider about how often you should have regular  mammograms. Talk with your health care provider about your test results, treatment options, and if necessary, the need for more tests. Vaccines  Your health care provider may recommend certain vaccines, such as: Influenza vaccine. This is recommended every year. Tetanus, diphtheria, and acellular pertussis (Tdap, Td) vaccine. You may need a Td booster every 10 years. Zoster vaccine. You may need this after age 13. Pneumococcal 13-valent conjugate (PCV13) vaccine. One dose is recommended after age 74. Pneumococcal polysaccharide (PPSV23) vaccine. One dose is recommended after age 59. Talk to your health care provider about which screenings and vaccines you need and how often you need them. This information is not intended to replace advice given to you by your health care provider. Make sure you discuss any questions you have with your health care provider. Document Released: 05/26/2015 Document Revised: 01/17/2016 Document Reviewed: 02/28/2015 Elsevier Interactive Patient Education  2017 [employer].  Fall Prevention in the Home Falls can cause injuries. They can happen to people of all ages. There are many things you can do to make your home safe and to help prevent falls. What can I do on the outside of my home? Regularly fix the edges of walkways and driveways and fix any cracks. Remove anything that might make you trip as you walk through a door, such as a raised step or threshold. Trim any bushes or trees on the path to your home. Use bright outdoor lighting. Clear any walking paths of anything that might make someone trip, such as rocks or tools. Regularly check to see if handrails are loose or broken. Make sure that both sides of any steps have handrails. Any raised decks and porches should have guardrails on the edges. Have any leaves, snow, or ice cleared regularly. Use sand or salt on walking paths during winter. Clean up any spills in your garage right away. This includes oil  or grease spills. What can I do in the bathroom? Use night lights. Install grab bars by the toilet and in the tub and shower. Do not use towel bars as grab bars. Use non-skid mats or decals in the tub or shower. If you need to sit down in the shower, use a plastic, non-slip stool. Keep the floor dry. Clean up any water that spills on the floor as soon as it happens. Remove soap buildup in the tub or shower regularly. Attach bath mats securely with double-sided non-slip rug tape. Do not have throw rugs and other things on the floor that can make you trip. What can I do in the bedroom? Use night lights. Make sure that you have a light by your bed that is easy to reach. Do not use any sheets or blankets that are too big for your bed. They should not hang down onto the floor. Have a firm chair that has side arms. You can use this for support while you get dressed. Do not have throw rugs and other things on the floor that can make you trip. What can I do in the kitchen? Clean up any spills right away. Avoid walking on wet floors. Keep items that you use a lot in easy-to-reach places. If you need to reach something above you, use a strong step stool that has a grab bar. Keep electrical cords out of the way. Do not use floor polish or wax that makes floors slippery. If you must use wax, use non-skid floor wax.  Do not have throw rugs and other things on the floor that can make you trip. What can I do with my stairs? Do not leave any items on the stairs. Make sure that there are handrails on both sides of the stairs and use them. Fix handrails that are broken or loose. Make sure that handrails are as long as the stairways. Check any carpeting to make sure that it is firmly attached to the stairs. Fix any carpet that is loose or worn. Avoid having throw rugs at the top or bottom of the stairs. If you do have throw rugs, attach them to the floor with carpet tape. Make sure that you have a light  switch at the top of the stairs and the bottom of the stairs. If you do not have them, ask someone to add them for you. What else can I do to help prevent falls? Wear shoes that: Do not have high heels. Have rubber bottoms. Are comfortable and fit you well. Are closed at the toe. Do not wear sandals. If you use a stepladder: Make sure that it is fully opened. Do not climb a closed stepladder. Make sure that both sides of the stepladder are locked into place. Ask someone to hold it for you, if possible. Clearly mark and make sure that you can see: Any grab bars or handrails. First and last steps. Where the edge of each step is. Use tools that help you move around (mobility aids) if they are needed. These include: Canes. Walkers. Scooters. Crutches. Turn on the lights when you go into a dark area. Replace any light bulbs as soon as they burn out. Set up your furniture so you have a clear path. Avoid moving your furniture around. If any of your floors are uneven, fix them. If there are any pets around you, be aware of where they are. Review your medicines with your doctor. Some medicines can make you feel dizzy. This can increase your chance of falling. Ask your doctor what other things that you can do to help prevent falls. This information is not intended to replace advice given to you by your health care provider. Make sure you discuss any questions you have with your health care provider. Document Released: 02/23/2009 Document Revised: 10/05/2015 Document Reviewed: 06/03/2014 Elsevier Interactive Patient Education  2017 [employer].

## 2020-11-02 NOTE — Progress Notes (Signed)
Subjective:   Denise Underwood is a 75 y.o. female who presents for Medicare Annual (Subsequent) preventive examination.  Virtual Visit via Telephone Note  I connected with  [name] on 11/02/20 at  2:00 PM EDT by telephone and verified that I am speaking with the correct person using two identifiers.  Location: Patient: Home Provider: [facility] Persons participating in the virtual visit: patient, daughter Denise/Nurse Health Advisor   I discussed the limitations, risks, security and privacy concerns of performing an evaluation and management service by telephone and the availability of in person appointments. The patient expressed understanding and agreed to proceed.  Interactive audio and video telecommunications were attempted between this nurse and patient, however failed, due to patient having technical difficulties OR patient did not have access to video capability.  We continued and completed visit with audio only.  Some vital signs may be absent or patient reported.   Itzayana Pardy E Nickolette Espinola, LPN   Review of Systems     Cardiac Risk Factors include: advanced age (>21men, >38 women);sedentary lifestyle;obesity (BMI >30kg/m2);dyslipidemia;smoking/ tobacco exposure     Objective:    Today's Vitals   11/02/20 1350  Weight: 177 lb (80.3 kg)  Height: 5\' 4"  (1.626 m)  PainSc: 7    Body mass index is 30.38 kg/m.  Advanced Directives 11/02/2019 10/02/2018 05/08/2018 04/21/2018 04/16/2018 04/13/2018 04/13/2018  Does Patient Have a Medical Advance Directive? No No No No Yes Yes Yes  Type of Advance Directive - - - - (No Data) (No Data) (No Data)  Does patient want to make changes to medical advance directive? - - - No - Patient declined No - Patient declined No - Patient declined No - Patient declined  Would patient like information on creating a medical advance directive? No - Patient declined No - Patient declined - - No - Patient declined No - Patient declined No - Patient declined     Current Medications (verified) Outpatient Encounter Medications as of 11/02/2020  Medication Sig   albuterol (VENTOLIN HFA) 108 (90 Base) MCG/ACT inhaler Inhale 2 puffs into the lungs every 6 (six) hours as needed.   Cholecalciferol (VITAMIN D3) 1000 units CAPS Take by mouth.   diclofenac Sodium (VOLTAREN) 1 % GEL Apply 2 g topically 4 (four) times daily.   escitalopram (LEXAPRO) 20 MG tablet Take 1 tablet (20 mg total) by mouth daily.   hydrochlorothiazide (HYDRODIURIL) 25 MG tablet TAKE (1) TABLET BY MOUTH ONCE DAILY AS NEEDED.   hydrocortisone 2.5 % cream Apply topically 2 (two) times daily.   meloxicam (MOBIC) 7.5 MG tablet Take 1 tablet (7.5 mg total) by mouth daily.   nystatin cream (MYCOSTATIN) Apply 1 application topically 2 (two) times daily.   rosuvastatin (CRESTOR) 20 MG tablet Take 1 tablet (20 mg total) by mouth daily.   traMADol HCl 100 MG TABS Take 100 mg by mouth 2 (two) times daily as needed.   No facility-administered encounter medications on file as of 11/02/2020.    Allergies (verified) Patient has no known allergies.   History: Past Medical History:  Diagnosis Date   Anxiety    Arthritis    Hyperlipidemia    Hypertension    Psoriasis    Past Surgical History:  Procedure Laterality Date   ABDOMINAL HYSTERECTOMY     CHOLECYSTECTOMY     HERNIA REPAIR     KNEE SURGERY     TUBAL LIGATION     History reviewed. No pertinent family history. Social History   Socioeconomic  History   Marital status: Single    Spouse name: Not on file   Number of children: 2   Years of education: Not on file   Highest education level: 9th grade  Occupational History   Occupation: Retired  Tobacco Use   Smoking status: Every Day    Packs/day: 0.50    Years: 40.00    Pack years: 20.00    Types: Cigarettes   Smokeless tobacco: Never  Vaping Use   Vaping Use: Never used  Substance and Sexual Activity   Alcohol use: No   Drug use: No   Sexual activity: Not Currently     Birth control/protection: Surgical  Other Topics Concern   Not on file  Social History Narrative   She lives alone on ground level apartment - has children. Daughter, [name] is her main caregiver and means for transportation.   Social Determinants of Health   Financial Resource Strain: Low Risk    Difficulty of Paying Living Expenses: Not very hard  Food Insecurity: No Food Insecurity   Worried About Programme researcher, broadcasting/film/video in the Last Year: Never true   Ran Out of Food in the Last Year: Never true  Transportation Needs: Unmet Transportation Needs   Lack of Transportation (Medical): Yes   Lack of Transportation (Non-Medical): No  Physical Activity: Inactive   Days of Exercise per Week: 0 days   Minutes of Exercise per Session: 0 min  Stress: Stress Concern Present   Feeling of Stress : To some extent  Social Connections: Moderately Isolated   Frequency of Communication with Friends and Family: More than three times a week   Frequency of Social Gatherings with Friends and Family: Once a week   Attends Religious Services: 1 to 4 times per year   Active Member of [employer] or Organizations: No   Attends Engineer, structural: Never   Marital Status: Divorced    Tobacco Counseling Ready to quit: No Counseling given: Yes   Clinical Intake:  Pre-visit preparation completed: Yes  Pain : 0-10 Pain Score: 7  Pain Type: Chronic pain Pain Location: Generalized Pain Descriptors / Indicators: Aching, Discomfort, Sore Pain Onset: More than a month ago Pain Frequency: Intermittent     BMI - recorded: 30.38 Nutritional Status: BMI > 30  Obese Nutritional Risks: None Diabetes: No  How often do you need to have someone help you when you read instructions, pamphlets, or other written materials from your doctor or pharmacy?: 2 - Rarely  Diabetic? no  Interpreter Needed?: No  Information entered by :: Odella Appelhans, LPN   Activities of Daily Living In your present state  of health, do you have any difficulty performing the following activities: 11/02/2020  Hearing? N  Vision? N  Difficulty concentrating or making decisions? Y  Walking or climbing stairs? N  Dressing or bathing? N  Doing errands, shopping? Y  Preparing Food and eating ? N  Using the Toilet? N  In the past six months, have you accidently leaked urine? N  Do you have problems with loss of bowel control? N  Managing your Medications? N  Managing your Finances? N  Housekeeping or managing your Housekeeping? N  Some recent data might be hidden    Patient Care Team: Gwenlyn Fudge, FNP as PCP - General (Family Medicine) Magrinat, Valentino Hue, MD as Consulting Physician (Oncology) Toma Deiters, MD (Internal Medicine) Ubaldo Glassing Lebron Conners, MD as Consulting Physician (Internal Medicine)  Indicate any recent Medical Services you  may have received from other than [facility] providers in the past year (date may be approximate).     Assessment:   This is a routine wellness examination for Denora.  Hearing/Vision screen Hearing Screening - Comments:: Denies hearing difficulties  Vision Screening - Comments:: Wears eyeglasses - behind on annual eye exams at [facility] in [place]  Dietary issues and exercise activities discussed: Current Exercise Habits: The patient does not participate in regular exercise at present, Exercise limited by: orthopedic condition(s)   Goals Addressed             This Visit's Progress    Prevent falls   On track      Depression Screen PHQ 2/9 Scores 11/02/2020 09/26/2020 06/21/2020 11/02/2019 08/29/2019 01/26/2019 11/06/2018  PHQ - 2 Score 2 4 2 4 4  0 0  PHQ- 9 Score 8 11 7 10 10  - 0    Fall Risk Fall Risk  11/02/2020 09/26/2020 06/21/2020 11/02/2019 08/25/2019  Falls in the past year? 1 1 0 0 0  Number falls in past yr: 1 1 - - -  Injury with Fall? 0 0 - - -  Comment - - - - -  Risk Factor Category  - - - - -  Risk for fall due to : Orthopedic patient;Impaired  balance/gait;Impaired vision;Medication side effect History of fall(s) - - -  Follow up Falls prevention discussed;Education provided Falls evaluation completed - - -  Comment - - - - -    FALL RISK PREVENTION PERTAINING TO THE HOME:  Any stairs in or around the home? No  If so, are there any without handrails? No  Home free of loose throw rugs in walkways, pet beds, electrical cords, etc? Yes  Adequate lighting in your home to reduce risk of falls? Yes   ASSISTIVE DEVICES UTILIZED TO PREVENT FALLS:  Life alert? Yes  Use of a cane, walker or w/c? Yes  Grab bars in the bathroom? Yes  Shower chair or bench in shower? Yes  Elevated toilet seat or a handicapped toilet? Yes   TIMED UP AND GO:  Was the test performed? No . Telephonic visit.  Cognitive Function: patient declined this screening today - says she is a little forgetful, but feels like it is mild - Normal cognitive status assessed by direct observation by this Nurse Health Advisor. No abnormalities found.      6CIT Screen 11/02/2019 10/02/2018  What Year? 0 points 0 points  What month? 0 points 0 points  What time? 0 points 0 points  Count back from 20 0 points 0 points  Months in reverse 4 points 4 points  Repeat phrase 2 points 2 points  Total Score 6 6    Immunizations Immunization History  Administered Date(s) Administered   Fluad Quad(high Dose 65+) 03/01/2019, 03/01/2020   Influenza Inj Mdck Quad Pf 02/26/2017   Influenza, High Dose Seasonal PF 02/17/2018   Moderna Sars-Covid-2 Vaccination 01/18/2020, 02/15/2020   Pneumococcal Conjugate-13 05/20/2018   Pneumococcal Polysaccharide-23 03/01/2019    TDAP status: Due, Education has been provided regarding the importance of this vaccine. Advised may receive this vaccine at local pharmacy or Health Dept. Aware to provide a copy of the vaccination record if obtained from local pharmacy or Health Dept. Verbalized acceptance and understanding.  Flu Vaccine status: Up  to date  Pneumococcal vaccine status: Up to date  Covid-19 vaccine status: Completed vaccines  Qualifies for Shingles Vaccine? Yes   Zostavax completed No   Shingrix Completed?: No.  Education has been provided regarding the importance of this vaccine. Patient has been advised to call insurance company to determine out of pocket expense if they have not yet received this vaccine. Advised may also receive vaccine at local pharmacy or Health Dept. Verbalized acceptance and understanding.  Screening Tests Health Maintenance  Topic Date Due   Zoster Vaccines- Shingrix (1 of 2) Never done   DEXA SCAN  Never done   COLONOSCOPY (Pts [phone] Insurance coverage will need to be confirmed)  12/09/2017   COVID-19 Vaccine (3 - Moderna risk series) 03/14/2020   TETANUS/TDAP  06/21/2021 (Originally 02/16/1965)   Hepatitis C Screening  09/26/2021 (Originally 02/16/1964)   INFLUENZA VACCINE  12/11/2020   PNA vac Low Risk Adult  Completed   HPV VACCINES  Aged Out    Health Maintenance  Health Maintenance Due  Topic Date Due   Zoster Vaccines- Shingrix (1 of 2) Never done   DEXA SCAN  Never done   COLONOSCOPY (Pts [phone] Insurance coverage will need to be confirmed)  12/09/2017   COVID-19 Vaccine (3 - Moderna risk series) 03/14/2020    Colorectal cancer screening: Referral to GI placed 09/2020. Pt aware the office will call re: appt.  Mammogram status: No longer required due to e.  Bone Density status: Ordered 09/2020. Pt provided with contact info and advised to call to schedule appt.  Lung Cancer Screening: (Low Dose CT Chest recommended if Age 74-80 years, 30 pack-year currently smoking OR have quit w/in 15years.) does qualify.   Lung Cancer Screening Referral:  discuss with PCP  Additional Screening:  Hepatitis C Screening: does qualify; patient declined  Vision Screening: Recommended annual ophthalmology exams for early detection of glaucoma and other disorders of the eye. Is the  patient up to date with their annual eye exam?  No  Who is the provider or what is the name of the office in which the patient attends annual eye exams? [facility] If pt is not established with a provider, would they like to be referred to a provider to establish care? No .   Dental Screening: Recommended annual dental exams for proper oral hygiene  Community Resource Referral / Chronic Care Management: CRR required this visit?  No   CCM required this visit?  No      Plan:     I have personally reviewed and noted the following in the patient's chart:   Medical and social history Use of alcohol, tobacco or illicit drugs  Current medications and supplements including opioid prescriptions.  Functional ability and status Nutritional status Physical activity Advanced directives List of other physicians Hospitalizations, surgeries, and ER visits in previous 12 months Vitals Screenings to include cognitive, depression, and falls Referrals and appointments  In addition, I have reviewed and discussed with patient certain preventive protocols, quality metrics, and best practice recommendations. A written personalized care plan for preventive services as well as general preventive health recommendations were provided to patient.     [place] Constablemy E Rasul Decola, LPN   6/01/09326/23/2022   Nurse Notes: None

## 2020-12-11 DIAGNOSIS — E785 Hyperlipidemia, unspecified: Secondary | ICD-10-CM | POA: Diagnosis not present

## 2020-12-11 DIAGNOSIS — E669 Obesity, unspecified: Secondary | ICD-10-CM | POA: Diagnosis not present

## 2020-12-11 DIAGNOSIS — R69 Illness, unspecified: Secondary | ICD-10-CM | POA: Diagnosis not present

## 2020-12-11 DIAGNOSIS — J40 Bronchitis, not specified as acute or chronic: Secondary | ICD-10-CM | POA: Diagnosis not present

## 2020-12-11 DIAGNOSIS — R32 Unspecified urinary incontinence: Secondary | ICD-10-CM | POA: Diagnosis not present

## 2020-12-11 DIAGNOSIS — M2550 Pain in unspecified joint: Secondary | ICD-10-CM | POA: Diagnosis not present

## 2020-12-11 DIAGNOSIS — K08109 Complete loss of teeth, unspecified cause, unspecified class: Secondary | ICD-10-CM | POA: Diagnosis not present

## 2020-12-11 DIAGNOSIS — I499 Cardiac arrhythmia, unspecified: Secondary | ICD-10-CM | POA: Diagnosis not present

## 2020-12-11 DIAGNOSIS — G8929 Other chronic pain: Secondary | ICD-10-CM | POA: Diagnosis not present

## 2020-12-11 DIAGNOSIS — I10 Essential (primary) hypertension: Secondary | ICD-10-CM | POA: Diagnosis not present

## 2020-12-27 ENCOUNTER — Other Ambulatory Visit: Payer: Self-pay

## 2020-12-27 ENCOUNTER — Encounter: Payer: Self-pay | Admitting: Family Medicine

## 2020-12-27 ENCOUNTER — Ambulatory Visit (INDEPENDENT_AMBULATORY_CARE_PROVIDER_SITE_OTHER): Payer: Medicare HMO | Admitting: Family Medicine

## 2020-12-27 VITALS — BP 125/76 | HR 83 | Temp 97.8°F | Ht 64.0 in | Wt 170.6 lb

## 2020-12-27 DIAGNOSIS — R69 Illness, unspecified: Secondary | ICD-10-CM | POA: Diagnosis not present

## 2020-12-27 DIAGNOSIS — R062 Wheezing: Secondary | ICD-10-CM | POA: Insufficient documentation

## 2020-12-27 DIAGNOSIS — M8949 Other hypertrophic osteoarthropathy, multiple sites: Secondary | ICD-10-CM | POA: Diagnosis not present

## 2020-12-27 DIAGNOSIS — F3342 Major depressive disorder, recurrent, in full remission: Secondary | ICD-10-CM | POA: Insufficient documentation

## 2020-12-27 DIAGNOSIS — Z79899 Other long term (current) drug therapy: Secondary | ICD-10-CM

## 2020-12-27 DIAGNOSIS — L409 Psoriasis, unspecified: Secondary | ICD-10-CM

## 2020-12-27 DIAGNOSIS — F411 Generalized anxiety disorder: Secondary | ICD-10-CM | POA: Diagnosis not present

## 2020-12-27 DIAGNOSIS — L408 Other psoriasis: Secondary | ICD-10-CM | POA: Diagnosis not present

## 2020-12-27 DIAGNOSIS — Z23 Encounter for immunization: Secondary | ICD-10-CM

## 2020-12-27 DIAGNOSIS — E7841 Elevated Lipoprotein(a): Secondary | ICD-10-CM | POA: Diagnosis not present

## 2020-12-27 DIAGNOSIS — I10 Essential (primary) hypertension: Secondary | ICD-10-CM

## 2020-12-27 DIAGNOSIS — M159 Polyosteoarthritis, unspecified: Secondary | ICD-10-CM

## 2020-12-27 MED ORDER — TRIAMCINOLONE ACETONIDE 0.025 % EX OINT: 1.0000 "application " | TOPICAL_OINTMENT | Freq: Two times a day (BID) | CUTANEOUS | 0 refills | Status: DC

## 2020-12-27 MED ORDER — ROSUVASTATIN CALCIUM 20 MG PO TABS: 20.0000 mg | ORAL_TABLET | Freq: Every day | ORAL | 1 refills | Status: DC

## 2020-12-27 MED ORDER — TRAMADOL HCL 100 MG PO TABS: 100.0000 mg | ORAL_TABLET | Freq: Two times a day (BID) | ORAL | 2 refills | Status: DC | PRN

## 2020-12-27 MED ORDER — ESCITALOPRAM OXALATE 20 MG PO TABS: 20.0000 mg | ORAL_TABLET | Freq: Every day | ORAL | 1 refills | Status: DC

## 2020-12-27 MED ORDER — HYDROCHLOROTHIAZIDE 25 MG PO TABS: 25.0000 mg | ORAL_TABLET | Freq: Every day | ORAL | 1 refills | Status: DC | PRN

## 2020-12-27 NOTE — Addendum Note (Signed)
Addended by: Angela Adam on: 12/27/2020 04:39 PM   Modules accepted: Orders

## 2020-12-27 NOTE — Patient Instructions (Signed)
Reminder: please return after mid-September for your flu shot. If you receive this elsewhere, such as your pharmacy, please let us know so we can get this documented in your chart.   

## 2020-12-27 NOTE — Progress Notes (Signed)
Assessment & Plan:  1. Primary osteoarthritis involving multiple joints - Well controlled on current regimen - traMADol HCl 100 MG TABS; Take 100 mg by mouth 2 (two) times daily as needed.  Dispense: 60 tablet; Refill: 2 - CMP14+EGFR  2. Controlled substance agreement signed - traMADol HCl 100 MG TABS; Take 100 mg by mouth 2 (two) times daily as needed.  Dispense: 60 tablet; Refill: 2  3. GAD (generalized anxiety disorder) -Well controlled on current regimen - escitalopram (LEXAPRO) 20 MG tablet; Take 1 tablet (20 mg total) by mouth daily.  Dispense: 90 tablet; Refill: 1 - CMP14+EGFR  4. Recurrent major depressive disorder, in full remission ([place]) - Well controlled on current regimen - CMP14+EGFR  5. Essential hypertension - Well controlled on current regimen - hydrochlorothiazide (HYDRODIURIL) 25 MG tablet; Take 1 tablet (25 mg total) by mouth daily as needed.  Dispense: 90 tablet; Refill: 1 - CMP14+EGFR - Lipid panel - CBC with Differential/Platelet  6. Wheezing - well managed on current regimen - albuterol as needed - declined ambulatory referral to pulmonology  7. Elevated lipoprotein(a) - Well controlled on current regimen - rosuvastatin (CRESTOR) 20 MG tablet; Take 1 tablet (20 mg total) by mouth daily.  Dispense: 90 tablet; Refill: 1 - CMP14+EGFR - Lipid panel  8. Immunization due - shingix and tdap today - will get flu when available  9. Psoriasis  - triamcinalone 0.025% cream - refused ambulatory referral to dermatology  10. Health maintenance - she declined colonoscopy and cologuard - declined DEXA  Return in about 3 months (around 03/29/2021) for annual physical.  Denise Crater, NP Student  Subjective:    Patient ID: Denise Underwood, female    DOB: 01/22/1946, 75 y.o.   MRN: [id number]  Patient Care Team: Loman Brooklyn, FNP as PCP - General (Family Medicine) Magrinat, Virgie Dad, MD as Consulting Physician (Oncology) Neale Burly, MD (Internal  Medicine) Milus Height, MD as Consulting Physician (Internal Medicine)   Chief Complaint:  Chief Complaint  Patient presents with   Hypertension   Hyperlipidemia    3 month follow up     HPI: Denise Underwood is a 75 y.o. female presenting on 12/27/2020 for Hypertension and Hyperlipidemia (3 month follow up )  Pain assessment: Cause of pain- osteoarthritis Pain location- both knees Pain on scale of 1-10- 9/10 without medication, 3-4/10 with medication Frequency- daily What increases pain- standing and walking What makes pain better- sitting, medication Effects on ADL- makes it very difficult. Patient is requesting that someone come into the home to help with things like vacuuming, laundry, and meals. She would also like them to help her get to the store when needed.  Any change in general medical condition- none  Current opioids rx- Tramadol 100 mg BID PRN # meds rx- 60 Effectiveness of current meds- good Adverse reactions from pain meds- none Morphine equivalent- 20 MME/day  Pill count performed-No Last drug screen - 06/21/2020 ( high risk q46m moderate risk q629mlow risk yearly ) Urine drug screen today- No [place]  If yes were their any concerning findings? - No  Overdose risk: 90  Opioid Risk  06/22/2020  Alcohol 0  Illegal Drugs 0  Rx Drugs 0  Alcohol 0  Illegal Drugs 0  Rx Drugs 0  Age between 16-45 years  0  History of Preadolescent Sexual Abuse 0  Psychological Disease 0  Depression 0  Opioid Risk Tool Scoring 0  Opioid Risk Interpretation  Low Risk   Pain contract signed on: 09/28/20  Wheezing: given an Albuterol inhaler last visit.  States she is using it 1-2 times per week.   Anxiety/Depression: She states this is well managed on her Lexapro.  GAD 7 : Generalized Anxiety Score 12/27/2020 09/26/2020 06/21/2020 08/29/2019  Nervous, Anxious, on Edge 3 3 3 3   Control/stop worrying 2 2 3 3   Worry too much - different things 2 1 2 3    Trouble relaxing 2 3 0 3  Restless 1 2 0 1  Easily annoyed or irritable 2 2 3 2   Afraid - awful might happen 0 0 0 3  Total GAD 7 Score 12 13 11 18   Anxiety Difficulty Very difficult Somewhat difficult Not difficult at all Somewhat difficult    Depression screen [facility] 2/9 12/27/2020 11/02/2020 09/26/2020  Decreased Interest 3 1 3   Down, Depressed, Hopeless 1 1 1   PHQ - 2 Score 4 2 4   Altered sleeping 3 1 1   Tired, decreased energy 3 2 2   Change in appetite 0 1 2  Feeling bad or failure about yourself  0 0 0  Trouble concentrating 0 1 0  Moving slowly or fidgety/restless 1 1 2   Suicidal thoughts 0 0 0  PHQ-9 Score 11 8 11   Difficult doing work/chores Not difficult at all Somewhat difficult Very difficult  Some recent data might be hidden   Hypertension: She is taking her blood pressure medication as prescribed. She is taking her blood pressures at home occasionally and obtaining readings 732K-025K systolic.  New complaints: She is having a psoriasis flare with scattered lesions on her arms, legs, trunk, and significant erythema and skin peeling on abdominal and inguinal folds.   Social history:  Relevant past medical, surgical, family and social history reviewed and updated as indicated. Interim medical history since our last visit reviewed.  Allergies and medications reviewed and updated.  DATA REVIEWED: CHART IN EPIC  ROS: Negative unless specifically indicated above in HPI.    Current Outpatient Medications:    albuterol (VENTOLIN HFA) 108 (90 Base) MCG/ACT inhaler, Inhale 2 puffs into the lungs every 6 (six) hours as needed., Disp: 18 g, Rfl: 2   Cholecalciferol (VITAMIN D3) 1000 units CAPS, Take by mouth., Disp: , Rfl:    diclofenac Sodium (VOLTAREN) 1 % GEL, Apply 2 g topically 4 (four) times daily., Disp: 350 g, Rfl: 2   escitalopram (LEXAPRO) 20 MG tablet, Take 1 tablet (20 mg total) by mouth daily., Disp: 90 tablet, Rfl: 1   hydrochlorothiazide (HYDRODIURIL) 25 MG  tablet, TAKE (1) TABLET BY MOUTH ONCE DAILY AS NEEDED., Disp: 90 tablet, Rfl: 1   hydrocortisone 2.5 % cream, Apply topically 2 (two) times daily., Disp: 453.6 g, Rfl: 2   meloxicam (MOBIC) 7.5 MG tablet, Take 1 tablet (7.5 mg total) by mouth daily., Disp: 30 tablet, Rfl: 2   nystatin cream (MYCOSTATIN), Apply 1 application topically 2 (two) times daily., Disp: 30 g, Rfl: 1   rosuvastatin (CRESTOR) 20 MG tablet, Take 1 tablet (20 mg total) by mouth daily., Disp: 90 tablet, Rfl: 1   traMADol HCl 100 MG TABS, Take 100 mg by mouth 2 (two) times daily as needed., Disp: 60 tablet, Rfl: 2   No Known Allergies Past Medical History:  Diagnosis Date   Anxiety    Arthritis    Hyperlipidemia    Hypertension    Psoriasis     Past Surgical History:  Procedure Laterality Date   ABDOMINAL HYSTERECTOMY  CHOLECYSTECTOMY     HERNIA REPAIR     KNEE SURGERY     TUBAL LIGATION      Social History   Socioeconomic History   Marital status: Single    Spouse name: Not on file   Number of children: 2   Years of education: Not on file   Highest education level: 9th grade  Occupational History   Occupation: Retired  Tobacco Use   Smoking status: Every Day    Packs/day: 0.50    Years: 40.00    Pack years: 20.00    Types: Cigarettes   Smokeless tobacco: Never  Vaping Use   Vaping Use: Never used  Substance and Sexual Activity   Alcohol use: No   Drug use: No   Sexual activity: Not Currently    Birth control/protection: Surgical  Other Topics Concern   Not on file  Social History Narrative   She lives alone on ground level apartment - has children. Daughter, [name] is her main caregiver and means for transportation.   Social Determinants of Health   Financial Resource Strain: Low Risk    Difficulty of Paying Living Expenses: Not very hard  Food Insecurity: No Food Insecurity   Worried About Charity fundraiser in the Last Year: Never true   Ran Out of Food in the Last Year: Never true   Transportation Needs: Unmet Transportation Needs   Lack of Transportation (Medical): Yes   Lack of Transportation (Non-Medical): No  Physical Activity: Inactive   Days of Exercise per Week: 0 days   Minutes of Exercise per Session: 0 min  Stress: Stress Concern Present   Feeling of Stress : To some extent  Social Connections: Moderately Isolated   Frequency of Communication with Friends and Family: More than three times a week   Frequency of Social Gatherings with Friends and Family: Once a week   Attends Religious Services: 1 to 4 times per year   Active Member of [employer] or Organizations: No   Attends Archivist Meetings: Never   Marital Status: Divorced  Human resources officer Violence: Not At Risk   Fear of Current or Ex-Partner: No   Emotionally Abused: No   Physically Abused: No   Sexually Abused: No        Objective:    BP 125/76   Pulse 83   Temp 97.8 F (36.6 C) (Temporal)   Ht 5' 4"  (1.626 m)   Wt 77.4 kg   SpO2 91%   BMI 29.28 kg/m   Wt Readings from Last 3 Encounters:  12/27/20 77.4 kg  11/02/20 80.3 kg  09/26/20 80.3 kg    Physical Exam Vitals reviewed.  Constitutional:      General: She is not in acute distress.    Appearance: Normal appearance. She is obese. She is not ill-appearing, toxic-appearing or diaphoretic.  HENT:     Head: Normocephalic and atraumatic.  Eyes:     General: No scleral icterus.       Right eye: No discharge.        Left eye: No discharge.     Conjunctiva/sclera: Conjunctivae normal.  Cardiovascular:     Rate and Rhythm: Normal rate and regular rhythm.     Heart sounds: Normal heart sounds. No murmur heard.   No friction rub. No gallop.  Pulmonary:     Effort: Pulmonary effort is normal. No respiratory distress.     Breath sounds: Normal breath sounds. No stridor. No wheezing, rhonchi or rales.  Musculoskeletal:        General: Normal range of motion.     Cervical back: Normal range of motion.  Skin:    General:  Skin is warm and dry.     Capillary Refill: Capillary refill takes less than 2 seconds.     Findings: Erythema and rash present. Rash is crusting and scaling.     Nails: There is clubbing.     Comments: Deep erythema under abdominal and groin folds. Scattered flats, erythematous lesions on arms, trunk, and legs.  Neurological:     General: No focal deficit present.     Mental Status: She is alert and oriented to person, place, and time. Mental status is at baseline.  Psychiatric:        Mood and Affect: Mood normal.        Behavior: Behavior normal.        Thought Content: Thought content normal.        Judgment: Judgment normal.    Lab Results  Component Value Date   TSH 1.270 06/21/2020   Lab Results  Component Value Date   WBC 15.8 (H) 06/21/2020   HGB 14.7 06/21/2020   HCT 43.1 06/21/2020   MCV 97 06/21/2020   PLT 224 06/21/2020   Lab Results  Component Value Date   NA 144 06/21/2020   K 4.4 06/21/2020   CO2 30 (H) 06/21/2020   GLUCOSE 82 06/21/2020   BUN 16 06/21/2020   CREATININE 0.88 06/21/2020   BILITOT 0.4 06/21/2020   ALKPHOS 68 06/21/2020   AST 21 06/21/2020   ALT 14 06/21/2020   PROT 6.8 06/21/2020   ALBUMIN 4.3 06/21/2020   CALCIUM 9.9 06/21/2020   ANIONGAP 8 05/08/2018   Lab Results  Component Value Date   CHOL 181 06/21/2020   Lab Results  Component Value Date   HDL 55 06/21/2020   Lab Results  Component Value Date   LDLCALC 82 06/21/2020   Lab Results  Component Value Date   TRIG 273 (H) 06/21/2020   Lab Results  Component Value Date   CHOLHDL 3.3 06/21/2020   No results found for: HGBA1C

## 2020-12-28 LAB — CBC WITH DIFFERENTIAL/PLATELET
Basophils Absolute: 0.1 10*3/uL (ref 0.0–0.2)
Basos: 1 %
EOS (ABSOLUTE): 0.6 10*3/uL — ABNORMAL HIGH (ref 0.0–0.4)
Eos: 4 %
Hematocrit: 43.6 % (ref 34.0–46.6)
Hemoglobin: 14.4 g/dL (ref 11.1–15.9)
Immature Grans (Abs): 0.1 10*3/uL (ref 0.0–0.1)
Immature Granulocytes: 1 %
Lymphocytes Absolute: 4.4 10*3/uL — ABNORMAL HIGH (ref 0.7–3.1)
Lymphs: 29 %
MCH: 32.4 pg (ref 26.6–33.0)
MCHC: 33 g/dL (ref 31.5–35.7)
MCV: 98 fL — ABNORMAL HIGH (ref 79–97)
Monocytes Absolute: 1.2 10*3/uL — ABNORMAL HIGH (ref 0.1–0.9)
Monocytes: 8 %
Neutrophils Absolute: 9 10*3/uL — ABNORMAL HIGH (ref 1.4–7.0)
Neutrophils: 57 %
Platelets: 206 10*3/uL (ref 150–450)
RBC: 4.45 x10E6/uL (ref 3.77–5.28)
RDW: 12.5 % (ref 11.7–15.4)
WBC: 15.4 10*3/uL — ABNORMAL HIGH (ref 3.4–10.8)

## 2020-12-28 LAB — CMP14+EGFR
ALT: 10 IU/L (ref 0–32)
AST: 15 IU/L (ref 0–40)
Albumin/Globulin Ratio: 1.7 (ref 1.2–2.2)
Albumin: 4.1 g/dL (ref 3.7–4.7)
Alkaline Phosphatase: 60 IU/L (ref 44–121)
BUN/Creatinine Ratio: 18 (ref 12–28)
BUN: 18 mg/dL (ref 8–27)
Bilirubin Total: 0.6 mg/dL (ref 0.0–1.2)
CO2: 27 mmol/L (ref 20–29)
Calcium: 9.6 mg/dL (ref 8.7–10.3)
Chloride: 102 mmol/L (ref 96–106)
Creatinine, Ser: 1.02 mg/dL — ABNORMAL HIGH (ref 0.57–1.00)
Globulin, Total: 2.4 g/dL (ref 1.5–4.5)
Glucose: 89 mg/dL (ref 65–99)
Potassium: 4.4 mmol/L (ref 3.5–5.2)
Sodium: 142 mmol/L (ref 134–144)
Total Protein: 6.5 g/dL (ref 6.0–8.5)
eGFR: 57 mL/min/{1.73_m2} — ABNORMAL LOW (ref >59)

## 2020-12-28 LAB — LIPID PANEL
Chol/HDL Ratio: 3.1 ratio (ref 0.0–4.4)
Cholesterol, Total: 156 mg/dL (ref 100–199)
HDL: 51 mg/dL (ref >39)
LDL Chol Calc (NIH): 71 mg/dL (ref 0–99)
Triglycerides: 208 mg/dL — ABNORMAL HIGH (ref 0–149)
VLDL Cholesterol Cal: 34 mg/dL (ref 5–40)

## 2021-01-29 ENCOUNTER — Telehealth: Payer: Self-pay | Admitting: Family Medicine

## 2021-01-29 ENCOUNTER — Other Ambulatory Visit: Payer: Self-pay | Admitting: Orthopedic Surgery

## 2021-01-29 MED ORDER — MELOXICAM 7.5 MG PO TABS: 7.5000 mg | ORAL_TABLET | Freq: Every day | ORAL | 2 refills | Status: DC

## 2021-01-29 NOTE — Telephone Encounter (Signed)
Patient requesting refill   meloxicam (MOBIC) 7.5 MG tablet 30 tablet 2             [facility]

## 2021-01-30 NOTE — Telephone Encounter (Signed)
Daughter aware and verbalizes understanding per dpr.  

## 2021-01-30 NOTE — Telephone Encounter (Signed)
Directions are for twice daily. She gets #60 per month.

## 2021-02-24 ENCOUNTER — Other Ambulatory Visit: Payer: Self-pay | Admitting: Family Medicine

## 2021-02-24 DIAGNOSIS — Z79899 Other long term (current) drug therapy: Secondary | ICD-10-CM

## 2021-02-24 DIAGNOSIS — M159 Polyosteoarthritis, unspecified: Secondary | ICD-10-CM

## 2021-03-12 ENCOUNTER — Ambulatory Visit: Payer: Medicare HMO | Admitting: Orthopedic Surgery

## 2021-03-28 ENCOUNTER — Other Ambulatory Visit: Payer: Self-pay | Admitting: Family Medicine

## 2021-03-28 DIAGNOSIS — M159 Polyosteoarthritis, unspecified: Secondary | ICD-10-CM

## 2021-03-28 DIAGNOSIS — Z79899 Other long term (current) drug therapy: Secondary | ICD-10-CM

## 2021-03-29 ENCOUNTER — Other Ambulatory Visit: Payer: Self-pay | Admitting: Family Medicine

## 2021-03-29 ENCOUNTER — Encounter: Payer: Medicare HMO | Admitting: Family Medicine

## 2021-03-29 DIAGNOSIS — Z79899 Other long term (current) drug therapy: Secondary | ICD-10-CM

## 2021-03-29 DIAGNOSIS — M159 Polyosteoarthritis, unspecified: Secondary | ICD-10-CM

## 2021-04-03 ENCOUNTER — Encounter: Payer: Self-pay | Admitting: Family Medicine

## 2021-04-03 ENCOUNTER — Ambulatory Visit (INDEPENDENT_AMBULATORY_CARE_PROVIDER_SITE_OTHER): Payer: Medicare HMO | Admitting: Family Medicine

## 2021-04-03 ENCOUNTER — Other Ambulatory Visit: Payer: Self-pay

## 2021-04-03 VITALS — BP 133/81 | HR 106 | Temp 97.2°F | Ht 64.0 in | Wt 171.0 lb

## 2021-04-03 DIAGNOSIS — M150 Primary generalized (osteo)arthritis: Secondary | ICD-10-CM

## 2021-04-03 DIAGNOSIS — Z79899 Other long term (current) drug therapy: Secondary | ICD-10-CM | POA: Diagnosis not present

## 2021-04-03 DIAGNOSIS — M25561 Pain in right knee: Secondary | ICD-10-CM | POA: Diagnosis not present

## 2021-04-03 DIAGNOSIS — M25562 Pain in left knee: Secondary | ICD-10-CM

## 2021-04-03 DIAGNOSIS — F411 Generalized anxiety disorder: Secondary | ICD-10-CM

## 2021-04-03 DIAGNOSIS — M159 Polyosteoarthritis, unspecified: Secondary | ICD-10-CM | POA: Diagnosis not present

## 2021-04-03 DIAGNOSIS — Z23 Encounter for immunization: Secondary | ICD-10-CM | POA: Diagnosis not present

## 2021-04-03 DIAGNOSIS — I10 Essential (primary) hypertension: Secondary | ICD-10-CM | POA: Diagnosis not present

## 2021-04-03 DIAGNOSIS — F3342 Major depressive disorder, recurrent, in full remission: Secondary | ICD-10-CM

## 2021-04-03 DIAGNOSIS — G8929 Other chronic pain: Secondary | ICD-10-CM

## 2021-04-03 DIAGNOSIS — R69 Illness, unspecified: Secondary | ICD-10-CM | POA: Diagnosis not present

## 2021-04-03 MED ORDER — TRAMADOL HCL 50 MG PO TABS: 100.0000 mg | ORAL_TABLET | Freq: Two times a day (BID) | ORAL | 2 refills | Status: DC | PRN

## 2021-04-03 NOTE — Addendum Note (Signed)
Addended by: Dory Peru on: 04/03/2021 03:46 PM   Modules accepted: Orders

## 2021-04-03 NOTE — Progress Notes (Signed)
Assessment & Plan:  1-3. Bilateral chronic knee pain/Primary osteoarthritis involving multiple joints/Controlled substance agreement signed Well controlled on current regimen. Controlled substance agreement in place. Urine drug screen as expected. PDMP reviewed with no concerning findings. I did call pharmacy to discuss prescription and they confirmed they are unable to get the 100 mg tablets, so they filled the 50 mg tablets. However, they did not double the quantity, so she has been running out early.  - traMADol (ULTRAM) 50 MG tablet; Take 2 tablets (100 mg total) by mouth every 12 (twelve) hours as needed (pain).  Dispense: 120 tablet; Refill: 2  4. GAD (generalized anxiety disorder) Well controlled on current regimen.   5. Recurrent major depressive disorder, in full remission ([place]) Well controlled on current regimen.   6. Essential hypertension Well controlled on current regimen.   7. Immunization due Influenza and Shingrix given in office today.    Return in about 3 months (around 07/04/2021) for annual physical.  Hendricks Limes, MSN, APRN, FNP-C [facility] 04/03/21  Subjective:    Patient ID: Denise Underwood, female    DOB: Dec 21, 1945, 75 y.o.   MRN: [id number]  Patient Care Team: Loman Brooklyn, FNP as PCP - General (Family Medicine) Magrinat, Virgie Dad, MD as Consulting Physician (Oncology) Neale Burly, MD (Internal Medicine) Milus Height, MD as Consulting Physician (Internal Medicine)   Chief Complaint:  Chief Complaint  Patient presents with   Headache   Medical Management of Chronic Issues    HPI: Denise Underwood is a 75 y.o. female presenting on 04/03/2021 for Headache and Medical Management of Chronic Issues  Pain assessment: Cause of pain- osteoarthritis Pain location- both knees Pain on scale of 1-10- 9/10 without medication, 3-4/10 with medication Frequency- daily What increases pain- standing and walking What makes  pain better- sitting, medication Effects on ADL- makes it very difficult. Patient is requesting that someone come into the home to help with things like vacuuming, laundry, and meals. She would also like them to help her get to the store when needed.  Any change in general medical condition- none  Current opioids rx- Tramadol 100 mg BID PRN - patient reports she only received the 50 mg tablets #60, so she has been running out of medication early and therefore having to double up on her meloxicam  # meds rx- 60 Effectiveness of current meds- good Adverse reactions from pain meds- none Morphine equivalent- 20 MME/day  Pill count performed-No Last drug screen - 06/21/2020 ( high risk q32m moderate risk q671mlow risk yearly ) Urine drug screen today- No [place]  If yes were their any concerning findings? - No  Overdose risk: 70  Opioid Risk  06/22/2020  Alcohol 0  Illegal Drugs 0  Rx Drugs 0  Alcohol 0  Illegal Drugs 0  Rx Drugs 0  Age between 16-45 years  0  History of Preadolescent Sexual Abuse 0  Psychological Disease 0  Depression 0  Opioid Risk Tool Scoring 0  Opioid Risk Interpretation Low Risk   Pain contract signed on: 09/28/20  Wheezing:  using Albuterol rarely as long as she "stays out of the wind".  Anxiety/Depression: She states this is well managed on her Lexapro.  GAD 7 : Generalized Anxiety Score 04/03/2021 12/27/2020 09/26/2020 06/21/2020  Nervous, Anxious, on Edge _0 Control/stop worrying _1 Worry too much - different things _2 2  Trouble relaxing 0 2 3 0  Restless 0 1 2 0  Easily annoyed or irritable 1 2 2 3  Afraid - awful might happen 0 0 0 0  Total GAD 7 Score 4 12 13 11  Anxiety Difficulty Somewhat difficult Very difficult Somewhat difficult Not difficult at all   Depression screen PHQ 2/9 04/03/2021 12/27/2020 11/02/2020  Decreased Interest 2 3 1  Down, Depressed, Hopeless 1 1 1  PHQ - 2 Score 3 4 2  Altered sleeping 1  3 1  Tired, decreased energy 1 3 2  Change in appetite 0 0 1  Feeling bad or failure about yourself  0 0 0  Trouble concentrating 1 0 1  Moving slowly or fidgety/restless 0 1 1  Suicidal thoughts 0 0 0  PHQ-9 Score 6 11 8  Difficult doing work/chores Somewhat difficult Not difficult at all Somewhat difficult  Some recent data might be hidden   Hypertension: She is taking her blood pressure medication as prescribed. She is taking her blood pressures at home occasionally and obtaining readings 120s-130s systolic.  New complaints: None   Social history:  Relevant past medical, surgical, family and social history reviewed and updated as indicated. Interim medical history since our last visit reviewed.  Allergies and medications reviewed and updated.  DATA REVIEWED: CHART IN EPIC  ROS: Negative unless specifically indicated above in HPI.    Current Outpatient Medications:    albuterol (VENTOLIN HFA) 108 (90 Base) MCG/ACT inhaler, Inhale 2 puffs into the lungs every 6 (six) hours as needed., Disp: 18 g, Rfl: 2   Cholecalciferol (VITAMIN D3) 1000 units CAPS, Take by mouth., Disp: , Rfl:    diclofenac Sodium (VOLTAREN) 1 % GEL, Apply 2 g topically 4 (four) times daily., Disp: 350 g, Rfl: 2   escitalopram (LEXAPRO) 20 MG tablet, Take 1 tablet (20 mg total) by mouth daily., Disp: 90 tablet, Rfl: 1   hydrochlorothiazide (HYDRODIURIL) 25 MG tablet, Take 1 tablet (25 mg total) by mouth daily as needed., Disp: 90 tablet, Rfl: 1   hydrocortisone 2.5 % cream, Apply topically 2 (two) times daily., Disp: 453.6 g, Rfl: 2   meloxicam (MOBIC) 7.5 MG tablet, Take 1 tablet (7.5 mg total) by mouth daily., Disp: 30 tablet, Rfl: 2   nystatin cream (MYCOSTATIN), Apply 1 application topically 2 (two) times daily., Disp: 30 g, Rfl: 1   rosuvastatin (CRESTOR) 20 MG tablet, Take 1 tablet (20 mg total) by mouth daily., Disp: 90 tablet, Rfl: 1   traMADol HCl 100 MG TABS, Take 100 mg by mouth 2 (two) times  daily as needed., Disp: 60 tablet, Rfl: 2   triamcinolone (KENALOG) 0.025 % ointment, Apply 1 application topically 2 (two) times daily., Disp: 30 g, Rfl: 0   No Known Allergies Past Medical History:  Diagnosis Date   Anxiety    Arthritis    Hyperlipidemia    Hypertension    Psoriasis     Past Surgical History:  Procedure Laterality Date   ABDOMINAL HYSTERECTOMY     CHOLECYSTECTOMY     HERNIA REPAIR     KNEE SURGERY     TUBAL LIGATION      Social History   Socioeconomic History   Marital status: Single    Spouse name: Not on file   Number of children: 2   Years of education: Not on file   Highest education level: 9th grade  Occupational History   Occupation: Retired  Tobacco Use   Smoking status:   Every Day    Packs/day: 0.50    Years: 40.00    Pack years: 20.00    Types: Cigarettes   Smokeless tobacco: Never  Vaping Use   Vaping Use: Never used  Substance and Sexual Activity   Alcohol use: No   Drug use: No   Sexual activity: Not Currently    Birth control/protection: Surgical  Other Topics Concern   Not on file  Social History Narrative   She lives alone on ground level apartment - has children. Daughter, [name] is her main caregiver and means for transportation.   Social Determinants of Health   Financial Resource Strain: Low Risk    Difficulty of Paying Living Expenses: Not very hard  Food Insecurity: No Food Insecurity   Worried About Charity fundraiser in the Last Year: Never true   Ran Out of Food in the Last Year: Never true  Transportation Needs: Unmet Transportation Needs   Lack of Transportation (Medical): Yes   Lack of Transportation (Non-Medical): No  Physical Activity: Inactive   Days of Exercise per Week: 0 days   Minutes of Exercise per Session: 0 min  Stress: Stress Concern Present   Feeling of Stress : To some extent  Social Connections: Moderately Isolated   Frequency of Communication with Friends and Family: More than three times a  week   Frequency of Social Gatherings with Friends and Family: Once a week   Attends Religious Services: 1 to 4 times per year   Active Member of [employer] or Organizations: No   Attends Archivist Meetings: Never   Marital Status: Divorced  Human resources officer Violence: Not At Risk   Fear of Current or Ex-Partner: No   Emotionally Abused: No   Physically Abused: No   Sexually Abused: No        Objective:    BP 133/81   Pulse (!) 106   Temp (!) 97.2 F (36.2 C) (Temporal)   Ht 5' 4" (1.626 m)   Wt 171 lb (77.6 kg)   SpO2 94%   BMI 29.35 kg/m   Wt Readings from Last 3 Encounters:  04/03/21 171 lb (77.6 kg)  12/27/20 170 lb 9.6 oz (77.4 kg)  11/02/20 177 lb (80.3 kg)    Physical Exam Vitals reviewed.  Constitutional:      General: She is not in acute distress.    Appearance: Normal appearance. She is overweight. She is not ill-appearing, toxic-appearing or diaphoretic.  HENT:     Head: Normocephalic and atraumatic.  Eyes:     General: No scleral icterus.       Right eye: No discharge.        Left eye: No discharge.     Conjunctiva/sclera: Conjunctivae normal.  Cardiovascular:     Rate and Rhythm: Normal rate and regular rhythm.     Heart sounds: Normal heart sounds. No murmur heard.   No friction rub. No gallop.  Pulmonary:     Effort: Pulmonary effort is normal. No respiratory distress.     Breath sounds: Normal breath sounds. No stridor. No wheezing, rhonchi or rales.  Musculoskeletal:        General: Normal range of motion.     Cervical back: Normal range of motion.  Skin:    General: Skin is warm and dry.     Capillary Refill: Capillary refill takes less than 2 seconds.  Neurological:     General: No focal deficit present.     Mental Status: She  is alert and oriented to person, place, and time. Mental status is at baseline.  Psychiatric:        Mood and Affect: Mood normal.        Behavior: Behavior normal.        Thought Content: Thought content  normal.        Judgment: Judgment normal.    Lab Results  Component Value Date   TSH 1.270 06/21/2020   Lab Results  Component Value Date   WBC 15.4 (H) 12/27/2020   HGB 14.4 12/27/2020   HCT 43.6 12/27/2020   MCV 98 (H) 12/27/2020   PLT 206 12/27/2020   Lab Results  Component Value Date   NA 142 12/27/2020   K 4.4 12/27/2020   CO2 27 12/27/2020   GLUCOSE 89 12/27/2020   BUN 18 12/27/2020   CREATININE 1.02 (H) 12/27/2020   BILITOT 0.6 12/27/2020   ALKPHOS 60 12/27/2020   AST 15 12/27/2020   ALT 10 12/27/2020   PROT 6.5 12/27/2020   ALBUMIN 4.1 12/27/2020   CALCIUM 9.6 12/27/2020   ANIONGAP 8 05/08/2018   EGFR 57 (L) 12/27/2020   Lab Results  Component Value Date   CHOL 156 12/27/2020   Lab Results  Component Value Date   HDL 51 12/27/2020   Lab Results  Component Value Date   LDLCALC 71 12/27/2020   Lab Results  Component Value Date   TRIG 208 (H) 12/27/2020   Lab Results  Component Value Date   CHOLHDL 3.1 12/27/2020   No results found for: HGBA1C         

## 2021-04-18 ENCOUNTER — Encounter: Payer: Medicare HMO | Admitting: Family Medicine

## 2021-04-27 ENCOUNTER — Other Ambulatory Visit: Payer: Self-pay | Admitting: Family Medicine

## 2021-04-27 DIAGNOSIS — L408 Other psoriasis: Secondary | ICD-10-CM

## 2021-05-10 ENCOUNTER — Other Ambulatory Visit: Payer: Self-pay | Admitting: Radiology

## 2021-05-10 MED ORDER — MELOXICAM 7.5 MG PO TABS: 7.5000 mg | ORAL_TABLET | Freq: Every day | ORAL | 2 refills | Status: DC

## 2021-05-21 DIAGNOSIS — E119 Type 2 diabetes mellitus without complications: Secondary | ICD-10-CM | POA: Diagnosis not present

## 2021-05-22 DIAGNOSIS — E119 Type 2 diabetes mellitus without complications: Secondary | ICD-10-CM | POA: Diagnosis not present

## 2021-05-23 DIAGNOSIS — G8929 Other chronic pain: Secondary | ICD-10-CM | POA: Diagnosis not present

## 2021-05-23 DIAGNOSIS — Z791 Long term (current) use of non-steroidal anti-inflammatories (NSAID): Secondary | ICD-10-CM | POA: Diagnosis not present

## 2021-05-23 DIAGNOSIS — Z720 Tobacco use: Secondary | ICD-10-CM | POA: Diagnosis not present

## 2021-05-23 DIAGNOSIS — R69 Illness, unspecified: Secondary | ICD-10-CM | POA: Diagnosis not present

## 2021-05-23 DIAGNOSIS — I10 Essential (primary) hypertension: Secondary | ICD-10-CM | POA: Diagnosis not present

## 2021-05-23 DIAGNOSIS — Z008 Encounter for other general examination: Secondary | ICD-10-CM | POA: Diagnosis not present

## 2021-05-23 DIAGNOSIS — R32 Unspecified urinary incontinence: Secondary | ICD-10-CM | POA: Diagnosis not present

## 2021-05-23 DIAGNOSIS — K219 Gastro-esophageal reflux disease without esophagitis: Secondary | ICD-10-CM | POA: Diagnosis not present

## 2021-05-23 DIAGNOSIS — E119 Type 2 diabetes mellitus without complications: Secondary | ICD-10-CM | POA: Diagnosis not present

## 2021-05-23 DIAGNOSIS — Z8249 Family history of ischemic heart disease and other diseases of the circulatory system: Secondary | ICD-10-CM | POA: Diagnosis not present

## 2021-05-23 DIAGNOSIS — F411 Generalized anxiety disorder: Secondary | ICD-10-CM | POA: Diagnosis not present

## 2021-05-23 DIAGNOSIS — E785 Hyperlipidemia, unspecified: Secondary | ICD-10-CM | POA: Diagnosis not present

## 2021-05-23 DIAGNOSIS — L309 Dermatitis, unspecified: Secondary | ICD-10-CM | POA: Diagnosis not present

## 2021-05-23 DIAGNOSIS — M1990 Unspecified osteoarthritis, unspecified site: Secondary | ICD-10-CM | POA: Diagnosis not present

## 2021-05-23 DIAGNOSIS — F329 Major depressive disorder, single episode, unspecified: Secondary | ICD-10-CM | POA: Diagnosis not present

## 2021-05-24 DIAGNOSIS — E119 Type 2 diabetes mellitus without complications: Secondary | ICD-10-CM | POA: Diagnosis not present

## 2021-05-25 DIAGNOSIS — E119 Type 2 diabetes mellitus without complications: Secondary | ICD-10-CM | POA: Diagnosis not present

## 2021-05-28 DIAGNOSIS — E119 Type 2 diabetes mellitus without complications: Secondary | ICD-10-CM | POA: Diagnosis not present

## 2021-05-29 DIAGNOSIS — E119 Type 2 diabetes mellitus without complications: Secondary | ICD-10-CM | POA: Diagnosis not present

## 2021-05-30 DIAGNOSIS — E119 Type 2 diabetes mellitus without complications: Secondary | ICD-10-CM | POA: Diagnosis not present

## 2021-05-31 DIAGNOSIS — E119 Type 2 diabetes mellitus without complications: Secondary | ICD-10-CM | POA: Diagnosis not present

## 2021-06-01 DIAGNOSIS — E119 Type 2 diabetes mellitus without complications: Secondary | ICD-10-CM | POA: Diagnosis not present

## 2021-06-04 DIAGNOSIS — E119 Type 2 diabetes mellitus without complications: Secondary | ICD-10-CM | POA: Diagnosis not present

## 2021-06-05 DIAGNOSIS — E119 Type 2 diabetes mellitus without complications: Secondary | ICD-10-CM | POA: Diagnosis not present

## 2021-06-06 DIAGNOSIS — E119 Type 2 diabetes mellitus without complications: Secondary | ICD-10-CM | POA: Diagnosis not present

## 2021-06-07 DIAGNOSIS — E119 Type 2 diabetes mellitus without complications: Secondary | ICD-10-CM | POA: Diagnosis not present

## 2021-06-08 DIAGNOSIS — E119 Type 2 diabetes mellitus without complications: Secondary | ICD-10-CM | POA: Diagnosis not present

## 2021-06-11 DIAGNOSIS — E119 Type 2 diabetes mellitus without complications: Secondary | ICD-10-CM | POA: Diagnosis not present

## 2021-06-13 DIAGNOSIS — E119 Type 2 diabetes mellitus without complications: Secondary | ICD-10-CM | POA: Diagnosis not present

## 2021-06-15 DIAGNOSIS — E119 Type 2 diabetes mellitus without complications: Secondary | ICD-10-CM | POA: Diagnosis not present

## 2021-06-16 DIAGNOSIS — T148XXA Other injury of unspecified body region, initial encounter: Secondary | ICD-10-CM | POA: Diagnosis not present

## 2021-06-16 DIAGNOSIS — W19XXXA Unspecified fall, initial encounter: Secondary | ICD-10-CM | POA: Diagnosis not present

## 2021-06-18 DIAGNOSIS — E119 Type 2 diabetes mellitus without complications: Secondary | ICD-10-CM | POA: Diagnosis not present

## 2021-06-19 DIAGNOSIS — E119 Type 2 diabetes mellitus without complications: Secondary | ICD-10-CM | POA: Diagnosis not present

## 2021-06-20 DIAGNOSIS — E119 Type 2 diabetes mellitus without complications: Secondary | ICD-10-CM | POA: Diagnosis not present

## 2021-06-21 DIAGNOSIS — E119 Type 2 diabetes mellitus without complications: Secondary | ICD-10-CM | POA: Diagnosis not present

## 2021-06-22 DIAGNOSIS — E119 Type 2 diabetes mellitus without complications: Secondary | ICD-10-CM | POA: Diagnosis not present

## 2021-06-25 DIAGNOSIS — E119 Type 2 diabetes mellitus without complications: Secondary | ICD-10-CM | POA: Diagnosis not present

## 2021-06-26 DIAGNOSIS — E119 Type 2 diabetes mellitus without complications: Secondary | ICD-10-CM | POA: Diagnosis not present

## 2021-06-27 DIAGNOSIS — E119 Type 2 diabetes mellitus without complications: Secondary | ICD-10-CM | POA: Diagnosis not present

## 2021-06-28 DIAGNOSIS — E119 Type 2 diabetes mellitus without complications: Secondary | ICD-10-CM | POA: Diagnosis not present

## 2021-06-29 DIAGNOSIS — E119 Type 2 diabetes mellitus without complications: Secondary | ICD-10-CM | POA: Diagnosis not present

## 2021-07-02 DIAGNOSIS — E119 Type 2 diabetes mellitus without complications: Secondary | ICD-10-CM | POA: Diagnosis not present

## 2021-07-03 DIAGNOSIS — E119 Type 2 diabetes mellitus without complications: Secondary | ICD-10-CM | POA: Diagnosis not present

## 2021-07-04 ENCOUNTER — Ambulatory Visit (INDEPENDENT_AMBULATORY_CARE_PROVIDER_SITE_OTHER): Payer: Medicare HMO

## 2021-07-04 ENCOUNTER — Encounter: Payer: Medicare HMO | Admitting: Family Medicine

## 2021-07-04 ENCOUNTER — Ambulatory Visit (INDEPENDENT_AMBULATORY_CARE_PROVIDER_SITE_OTHER): Payer: Medicare HMO | Admitting: Family Medicine

## 2021-07-04 ENCOUNTER — Encounter: Payer: Self-pay | Admitting: Family Medicine

## 2021-07-04 VITALS — BP 123/88 | HR 98 | Temp 97.2°F | Ht 64.0 in | Wt 165.0 lb

## 2021-07-04 DIAGNOSIS — G8929 Other chronic pain: Secondary | ICD-10-CM

## 2021-07-04 DIAGNOSIS — R69 Illness, unspecified: Secondary | ICD-10-CM | POA: Diagnosis not present

## 2021-07-04 DIAGNOSIS — M8580 Other specified disorders of bone density and structure, unspecified site: Secondary | ICD-10-CM | POA: Insufficient documentation

## 2021-07-04 DIAGNOSIS — F411 Generalized anxiety disorder: Secondary | ICD-10-CM | POA: Diagnosis not present

## 2021-07-04 DIAGNOSIS — E119 Type 2 diabetes mellitus without complications: Secondary | ICD-10-CM | POA: Diagnosis not present

## 2021-07-04 DIAGNOSIS — M85852 Other specified disorders of bone density and structure, left thigh: Secondary | ICD-10-CM | POA: Diagnosis not present

## 2021-07-04 DIAGNOSIS — M25561 Pain in right knee: Secondary | ICD-10-CM

## 2021-07-04 DIAGNOSIS — R062 Wheezing: Secondary | ICD-10-CM

## 2021-07-04 DIAGNOSIS — I10 Essential (primary) hypertension: Secondary | ICD-10-CM | POA: Diagnosis not present

## 2021-07-04 DIAGNOSIS — Z780 Asymptomatic menopausal state: Secondary | ICD-10-CM | POA: Diagnosis not present

## 2021-07-04 DIAGNOSIS — M25562 Pain in left knee: Secondary | ICD-10-CM

## 2021-07-04 DIAGNOSIS — J069 Acute upper respiratory infection, unspecified: Secondary | ICD-10-CM

## 2021-07-04 DIAGNOSIS — G479 Sleep disorder, unspecified: Secondary | ICD-10-CM | POA: Diagnosis not present

## 2021-07-04 DIAGNOSIS — R739 Hyperglycemia, unspecified: Secondary | ICD-10-CM | POA: Diagnosis not present

## 2021-07-04 DIAGNOSIS — Z1159 Encounter for screening for other viral diseases: Secondary | ICD-10-CM | POA: Diagnosis not present

## 2021-07-04 DIAGNOSIS — E7841 Elevated Lipoprotein(a): Secondary | ICD-10-CM | POA: Diagnosis not present

## 2021-07-04 DIAGNOSIS — M159 Polyosteoarthritis, unspecified: Secondary | ICD-10-CM

## 2021-07-04 DIAGNOSIS — Z79899 Other long term (current) drug therapy: Secondary | ICD-10-CM

## 2021-07-04 DIAGNOSIS — F331 Major depressive disorder, recurrent, moderate: Secondary | ICD-10-CM

## 2021-07-04 HISTORY — DX: Other specified disorders of bone density and structure, unspecified site: M85.80

## 2021-07-04 MED ORDER — TRAZODONE HCL 50 MG PO TABS: 50.0000 mg | ORAL_TABLET | Freq: Every evening | ORAL | 3 refills | Status: DC | PRN

## 2021-07-04 MED ORDER — TRAMADOL HCL 50 MG PO TABS: 100.0000 mg | ORAL_TABLET | Freq: Two times a day (BID) | ORAL | 2 refills | Status: DC | PRN

## 2021-07-04 MED ORDER — ESCITALOPRAM OXALATE 20 MG PO TABS: 20.0000 mg | ORAL_TABLET | Freq: Every day | ORAL | 1 refills | Status: DC

## 2021-07-04 MED ORDER — ROSUVASTATIN CALCIUM 20 MG PO TABS: 20.0000 mg | ORAL_TABLET | Freq: Every day | ORAL | 1 refills | Status: DC

## 2021-07-04 MED ORDER — HYDROCHLOROTHIAZIDE 25 MG PO TABS: 25.0000 mg | ORAL_TABLET | Freq: Every day | ORAL | 1 refills | Status: DC | PRN

## 2021-07-04 NOTE — Progress Notes (Signed)
Assessment & Plan:  1-3. Bilateral chronic knee pain/Primary osteoarthritis involving multiple joints/Controlled substance agreement signed Well controlled on current regimen. Controlled substance agreement in place and updated today. Previous drug screen as expected; recollected today. PDMP reviewed with no concerning findings.  - traMADol (ULTRAM) 50 MG tablet; Take 2 tablets (100 mg total) by mouth every 12 (twelve) hours as needed (pain).  Dispense: 120 tablet; Refill: 2 - ToxASSURE Select 13 (MW), Urine - CMP14+EGFR  4. Moderate episode of recurrent major depressive disorder (HCC) Uncontrolled. Adding Trazodone.  - CMP14+EGFR - traZODone (DESYREL) 50 MG tablet; Take 1-2 tablets (50-100 mg total) by mouth at bedtime as needed for sleep.  Dispense: 30 tablet; Refill: 3  5. GAD (generalized anxiety disorder) Well controlled on current regimen.  - escitalopram (LEXAPRO) 20 MG tablet; Take 1 tablet (20 mg total) by mouth daily.  Dispense: 90 tablet; Refill: 1 - CMP14+EGFR  6. Difficulty sleeping Starting Trazodone. - traZODone (DESYREL) 50 MG tablet; Take 1-2 tablets (50-100 mg total) by mouth at bedtime as needed for sleep.  Dispense: 30 tablet; Refill: 3  7. Essential hypertension Well controlled on current regimen.  - hydrochlorothiazide (HYDRODIURIL) 25 MG tablet; Take 1 tablet (25 mg total) by mouth daily as needed.  Dispense: 90 tablet; Refill: 1 - CBC with Differential/Platelet - CMP14+EGFR - Lipid panel  8. Elevated lipoprotein(a) Well controlled on current regimen.  - rosuvastatin (CRESTOR) 20 MG tablet; Take 1 tablet (20 mg total) by mouth daily.  Dispense: 90 tablet; Refill: 1 - CMP14+EGFR - Lipid panel  9. Wheezing Well controlled on current regimen.   10. Viral URI Discussed symptom management. Education provided on viral URIs.   11. Postmenopausal estrogen deficiency - DG [facility] DEXA  12. Encounter for hepatitis C screening test for low risk patient -  Hepatitis C antibody (reflex, frozen specimen)   Return in about 3 months (around 10/01/2021) for annual physical.  Hendricks Limes, MSN, APRN, FNP-C [facility]  Subjective:    Patient ID: Denise Underwood, female    DOB: 1946/03/11, 76 y.o.   MRN: [id number]  Patient Care Team: Loman Brooklyn, FNP as PCP - General (Family Medicine) Magrinat, Virgie Dad, MD (Inactive) as Consulting Physician (Oncology) Neale Burly, MD (Internal Medicine) Milus Height, MD as Consulting Physician (Internal Medicine)   Chief Complaint:  Chief Complaint  Patient presents with   Medical Management of Chronic Issues   Nasal Congestion   sneezing   Cough    X 2 days    HPI: Denise Underwood is a 76 y.o. female presenting on 07/04/2021 for Medical Management of Chronic Issues, Nasal Congestion, sneezing, and Cough (X 2 days)  Pain assessment: Cause of pain- osteoarthritis Pain location- both knees Pain on scale of 1-10- 9/10 without medication, 3-4/10 with medication Frequency- daily What increases pain- standing and walking What makes pain better- sitting, medication Effects on ADL- makes it very difficult.  Any change in general medical condition- she has fallen twice since the last time she was here with the most recent being 2 weeks ago. States she just tripped over her own feet. She was not using her walker at the time.   Current opioids rx- Tramadol 100 mg BID PRN  # meds rx- 120 Effectiveness of current meds- good Adverse reactions from pain meds- none Morphine equivalent- 20 MME/day  Pill count performed-No Last drug screen - 06/21/2020 ( high risk q30m moderate risk q646mlow risk yearly ) Urine drug  screen today- Yes Was the [place] reviewed- Yes If yes were their any concerning findings? - No  Overdose risk: 80  Opioid Risk  06/22/2020  Alcohol 0  Illegal Drugs 0  Rx Drugs 0  Alcohol 0  Illegal Drugs 0  Rx Drugs 0  Age between 16-45 years  0  History  of Preadolescent Sexual Abuse 0  Psychological Disease 0  Depression 0  Opioid Risk Tool Scoring 0  Opioid Risk Interpretation Low Risk   Pain contract signed on: 09/28/20 - updated today 07/04/2021  Wheezing:  using Albuterol rarely as long as she "stays out of the wind".  Anxiety/Depression: She states this is well managed on her Lexapro, but is not sleeping well. Denies any bleeding due to concomitant use of meloxicam.   GAD 7 : Generalized Anxiety Score 07/04/2021 04/03/2021 12/27/2020 09/26/2020  Nervous, Anxious, on Edge 3 1 3 3   Control/stop worrying 2 1 2 2   Worry too much - different things 2 1 2 1   Trouble relaxing 2 0 2 3  Restless 2 0 1 2  Easily annoyed or irritable 3 1 2 2   Afraid - awful might happen 2 0 0 0  Total GAD 7 Score 16 4 12 13   Anxiety Difficulty Not difficult at all Somewhat difficult Very difficult Somewhat difficult   Depression screen [facility] 2/9 07/04/2021 04/03/2021 12/27/2020  Decreased Interest 3 2 3   Down, Depressed, Hopeless 2 1 1   PHQ - 2 Score 5 3 4   Altered sleeping 1 1 3   Tired, decreased energy 2 1 3   Change in appetite 0 0 0  Feeling bad or failure about yourself  1 0 0  Trouble concentrating 1 1 0  Moving slowly or fidgety/restless 0 0 1  Suicidal thoughts 0 0 0  PHQ-9 Score 10 6 11   Difficult doing work/chores Not difficult at all Somewhat difficult Not difficult at all  Some recent data might be hidden   Hypertension: She is taking her blood pressure medication as prescribed. She is taking her blood pressures at home occasionally and obtaining readings [id number] systolic.  New complaints: Patient complains of cough, head/chest congestion, runny nose, sneezing, postnasal drainage, and wheezing. Onset of symptoms was 2 days ago, unchanged since that time. She is drinking plenty of fluids. Evaluation to date: none. Treatment to date:  Tylenol . She does smoke. She does not have a diagnosis of COPD but does have some wheezing and is a  smoker.   Social history:  Relevant past medical, surgical, family and social history reviewed and updated as indicated. Interim medical history since our last visit reviewed.  Allergies and medications reviewed and updated.  DATA REVIEWED: CHART IN EPIC  ROS: Negative unless specifically indicated above in HPI.    Current Outpatient Medications:    albuterol (VENTOLIN HFA) 108 (90 Base) MCG/ACT inhaler, Inhale 2 puffs into the lungs every 6 (six) hours as needed., Disp: 18 g, Rfl: 2   Cholecalciferol (VITAMIN D3) 1000 units CAPS, Take by mouth., Disp: , Rfl:    diclofenac Sodium (VOLTAREN) 1 % GEL, Apply 2 g topically 4 (four) times daily., Disp: 350 g, Rfl: 2   escitalopram (LEXAPRO) 20 MG tablet, Take 1 tablet (20 mg total) by mouth daily., Disp: 90 tablet, Rfl: 1   hydrochlorothiazide (HYDRODIURIL) 25 MG tablet, Take 1 tablet (25 mg total) by mouth daily as needed., Disp: 90 tablet, Rfl: 1   hydrocortisone 2.5 % cream, Apply topically 2 (two) times daily., Disp: 453.6  g, Rfl: 2   meloxicam (MOBIC) 7.5 MG tablet, Take 1 tablet (7.5 mg total) by mouth daily., Disp: 30 tablet, Rfl: 2   nystatin cream (MYCOSTATIN), Apply 1 application topically 2 (two) times daily., Disp: 30 g, Rfl: 1   rosuvastatin (CRESTOR) 20 MG tablet, Take 1 tablet (20 mg total) by mouth daily., Disp: 90 tablet, Rfl: 1   traMADol (ULTRAM) 50 MG tablet, Take 2 tablets (100 mg total) by mouth every 12 (twelve) hours as needed (pain)., Disp: 120 tablet, Rfl: 2   triamcinolone (KENALOG) 0.025 % ointment, APPLY 1 APPLICATION TOPICALLY 2 TIMES A DAY., Disp: 80 g, Rfl: 1   No Known Allergies Past Medical History:  Diagnosis Date   Anxiety    Arthritis    Hyperlipidemia    Hypertension    Psoriasis     Past Surgical History:  Procedure Laterality Date   ABDOMINAL HYSTERECTOMY     CHOLECYSTECTOMY     HERNIA REPAIR     KNEE SURGERY     TUBAL LIGATION      Social History   Socioeconomic History   Marital  status: Single    Spouse name: Not on file   Number of children: 2   Years of education: Not on file   Highest education level: 9th grade  Occupational History   Occupation: Retired  Tobacco Use   Smoking status: Every Day    Packs/day: 0.50    Years: 40.00    Pack years: 20.00    Types: Cigarettes   Smokeless tobacco: Never  Vaping Use   Vaping Use: Never used  Substance and Sexual Activity   Alcohol use: No   Drug use: No   Sexual activity: Not Currently    Birth control/protection: Surgical  Other Topics Concern   Not on file  Social History Narrative   She lives alone on ground level apartment - has children. Daughter, [name] is her main caregiver and means for transportation.   Social Determinants of Health   Financial Resource Strain: Low Risk    Difficulty of Paying Living Expenses: Not very hard  Food Insecurity: No Food Insecurity   Worried About Charity fundraiser in the Last Year: Never true   Ran Out of Food in the Last Year: Never true  Transportation Needs: Unmet Transportation Needs   Lack of Transportation (Medical): Yes   Lack of Transportation (Non-Medical): No  Physical Activity: Inactive   Days of Exercise per Week: 0 days   Minutes of Exercise per Session: 0 min  Stress: Stress Concern Present   Feeling of Stress : To some extent  Social Connections: Moderately Isolated   Frequency of Communication with Friends and Family: More than three times a week   Frequency of Social Gatherings with Friends and Family: Once a week   Attends Religious Services: 1 to 4 times per year   Active Member of [employer] or Organizations: No   Attends Archivist Meetings: Never   Marital Status: Divorced  Human resources officer Violence: Not At Risk   Fear of Current or Ex-Partner: No   Emotionally Abused: No   Physically Abused: No   Sexually Abused: No        Objective:    BP 123/88    Pulse 98    Temp (!) 97.2 F (36.2 C) (Temporal)    Ht 5' 4"  (1.626 m)     Wt 165 lb (74.8 kg)    SpO2 91%    BMI 28.32  kg/m   Wt Readings from Last 3 Encounters:  07/04/21 165 lb (74.8 kg)  04/03/21 171 lb (77.6 kg)  12/27/20 170 lb 9.6 oz (77.4 kg)    Physical Exam Vitals reviewed.  Constitutional:      General: She is not in acute distress.    Appearance: Normal appearance. She is overweight. She is not ill-appearing, toxic-appearing or diaphoretic.  HENT:     Head: Normocephalic and atraumatic.     Right Ear: Tympanic membrane, ear canal and external ear normal. There is no impacted cerumen.     Left Ear: Tympanic membrane, ear canal and external ear normal. There is no impacted cerumen.     Nose: Nose normal. No congestion or rhinorrhea.     Mouth/Throat:     Mouth: Mucous membranes are moist.     Pharynx: Oropharynx is clear. No oropharyngeal exudate or posterior oropharyngeal erythema.  Eyes:     General: No scleral icterus.       Right eye: No discharge.        Left eye: No discharge.     Conjunctiva/sclera: Conjunctivae normal.  Cardiovascular:     Rate and Rhythm: Normal rate and regular rhythm.     Heart sounds: Normal heart sounds. No murmur heard.   No friction rub. No gallop.  Pulmonary:     Effort: Pulmonary effort is normal. No respiratory distress.     Breath sounds: Normal breath sounds. No stridor. No wheezing, rhonchi or rales.  Musculoskeletal:        General: Normal range of motion.     Cervical back: Normal range of motion.  Lymphadenopathy:     Cervical: No cervical adenopathy.  Skin:    General: Skin is warm and dry.     Capillary Refill: Capillary refill takes less than 2 seconds.  Neurological:     General: No focal deficit present.     Mental Status: She is alert and oriented to person, place, and time. Mental status is at baseline.  Psychiatric:        Mood and Affect: Mood normal.        Behavior: Behavior normal.        Thought Content: Thought content normal.        Judgment: Judgment normal.    Lab  Results  Component Value Date   TSH 1.270 06/21/2020   Lab Results  Component Value Date   WBC 15.4 (H) 12/27/2020   HGB 14.4 12/27/2020   HCT 43.6 12/27/2020   MCV 98 (H) 12/27/2020   PLT 206 12/27/2020   Lab Results  Component Value Date   NA 142 12/27/2020   K 4.4 12/27/2020   CO2 27 12/27/2020   GLUCOSE 89 12/27/2020   BUN 18 12/27/2020   CREATININE 1.02 (H) 12/27/2020   BILITOT 0.6 12/27/2020   ALKPHOS 60 12/27/2020   AST 15 12/27/2020   ALT 10 12/27/2020   PROT 6.5 12/27/2020   ALBUMIN 4.1 12/27/2020   CALCIUM 9.6 12/27/2020   ANIONGAP 8 05/08/2018   EGFR 57 (L) 12/27/2020   Lab Results  Component Value Date   CHOL 156 12/27/2020   Lab Results  Component Value Date   HDL 51 12/27/2020   Lab Results  Component Value Date   LDLCALC 71 12/27/2020   Lab Results  Component Value Date   TRIG 208 (H) 12/27/2020   Lab Results  Component Value Date   CHOLHDL 3.1 12/27/2020   No results found for: HGBA1C

## 2021-07-05 DIAGNOSIS — E119 Type 2 diabetes mellitus without complications: Secondary | ICD-10-CM | POA: Diagnosis not present

## 2021-07-05 LAB — CMP14+EGFR
ALT: 14 IU/L (ref 0–32)
AST: 24 IU/L (ref 0–40)
Albumin/Globulin Ratio: 2 (ref 1.2–2.2)
Albumin: 4.3 g/dL (ref 3.7–4.7)
Alkaline Phosphatase: 87 IU/L (ref 44–121)
BUN/Creatinine Ratio: 15 (ref 12–28)
BUN: 14 mg/dL (ref 8–27)
Bilirubin Total: 0.3 mg/dL (ref 0.0–1.2)
CO2: 25 mmol/L (ref 20–29)
Calcium: 9.9 mg/dL (ref 8.7–10.3)
Chloride: 101 mmol/L (ref 96–106)
Creatinine, Ser: 0.94 mg/dL (ref 0.57–1.00)
Globulin, Total: 2.1 g/dL (ref 1.5–4.5)
Glucose: 125 mg/dL — ABNORMAL HIGH (ref 70–99)
Potassium: 4.2 mmol/L (ref 3.5–5.2)
Sodium: 142 mmol/L (ref 134–144)
Total Protein: 6.4 g/dL (ref 6.0–8.5)
eGFR: 63 mL/min/{1.73_m2} (ref >59)

## 2021-07-05 LAB — LIPID PANEL
Chol/HDL Ratio: 2.6 ratio (ref 0.0–4.4)
Cholesterol, Total: 153 mg/dL (ref 100–199)
HDL: 60 mg/dL (ref >39)
LDL Chol Calc (NIH): 58 mg/dL (ref 0–99)
Triglycerides: 218 mg/dL — ABNORMAL HIGH (ref 0–149)
VLDL Cholesterol Cal: 35 mg/dL (ref 5–40)

## 2021-07-05 LAB — CBC WITH DIFFERENTIAL/PLATELET
Basophils Absolute: 0.2 10*3/uL (ref 0.0–0.2)
Basos: 1 %
EOS (ABSOLUTE): 0.8 10*3/uL — ABNORMAL HIGH (ref 0.0–0.4)
Eos: 5 %
Hematocrit: 43.4 % (ref 34.0–46.6)
Hemoglobin: 14.4 g/dL (ref 11.1–15.9)
Immature Grans (Abs): 0 10*3/uL (ref 0.0–0.1)
Immature Granulocytes: 0 %
Lymphocytes Absolute: 3.7 10*3/uL — ABNORMAL HIGH (ref 0.7–3.1)
Lymphs: 23 %
MCH: 32.9 pg (ref 26.6–33.0)
MCHC: 33.2 g/dL (ref 31.5–35.7)
MCV: 99 fL — ABNORMAL HIGH (ref 79–97)
Monocytes Absolute: 1.6 10*3/uL — ABNORMAL HIGH (ref 0.1–0.9)
Monocytes: 9 %
Neutrophils Absolute: 10.2 10*3/uL — ABNORMAL HIGH (ref 1.4–7.0)
Neutrophils: 62 %
Platelets: 302 10*3/uL (ref 150–450)
RBC: 4.38 x10E6/uL (ref 3.77–5.28)
RDW: 12.3 % (ref 11.7–15.4)
WBC: 16.5 10*3/uL — ABNORMAL HIGH (ref 3.4–10.8)

## 2021-07-05 LAB — HCV AB W REFLEX TO QUANT PCR: HCV Ab: NONREACTIVE

## 2021-07-05 LAB — HCV INTERPRETATION

## 2021-07-06 DIAGNOSIS — E119 Type 2 diabetes mellitus without complications: Secondary | ICD-10-CM | POA: Diagnosis not present

## 2021-07-09 ENCOUNTER — Encounter: Payer: Self-pay | Admitting: Family Medicine

## 2021-07-09 DIAGNOSIS — R7303 Prediabetes: Secondary | ICD-10-CM

## 2021-07-09 DIAGNOSIS — E119 Type 2 diabetes mellitus without complications: Secondary | ICD-10-CM | POA: Diagnosis not present

## 2021-07-09 HISTORY — DX: Prediabetes: R73.03

## 2021-07-09 LAB — HGB A1C W/O EAG: Hgb A1c MFr Bld: 5.7 % — ABNORMAL HIGH (ref 4.8–5.6)

## 2021-07-09 LAB — SPECIMEN STATUS REPORT

## 2021-07-10 ENCOUNTER — Other Ambulatory Visit: Payer: Self-pay | Admitting: Family Medicine

## 2021-07-10 DIAGNOSIS — E119 Type 2 diabetes mellitus without complications: Secondary | ICD-10-CM | POA: Diagnosis not present

## 2021-07-10 LAB — TOXASSURE SELECT 13 (MW), URINE

## 2021-07-11 DIAGNOSIS — E119 Type 2 diabetes mellitus without complications: Secondary | ICD-10-CM | POA: Diagnosis not present

## 2021-07-12 DIAGNOSIS — E119 Type 2 diabetes mellitus without complications: Secondary | ICD-10-CM | POA: Diagnosis not present

## 2021-07-13 ENCOUNTER — Other Ambulatory Visit: Payer: Self-pay | Admitting: Family Medicine

## 2021-07-13 ENCOUNTER — Other Ambulatory Visit: Payer: Medicare HMO

## 2021-07-13 DIAGNOSIS — G8929 Other chronic pain: Secondary | ICD-10-CM | POA: Diagnosis not present

## 2021-07-13 DIAGNOSIS — Z79899 Other long term (current) drug therapy: Secondary | ICD-10-CM

## 2021-07-13 DIAGNOSIS — M25561 Pain in right knee: Secondary | ICD-10-CM | POA: Diagnosis not present

## 2021-07-13 DIAGNOSIS — M25562 Pain in left knee: Secondary | ICD-10-CM | POA: Diagnosis not present

## 2021-07-13 DIAGNOSIS — E119 Type 2 diabetes mellitus without complications: Secondary | ICD-10-CM | POA: Diagnosis not present

## 2021-07-13 DIAGNOSIS — M159 Polyosteoarthritis, unspecified: Secondary | ICD-10-CM | POA: Diagnosis not present

## 2021-07-16 DIAGNOSIS — E119 Type 2 diabetes mellitus without complications: Secondary | ICD-10-CM | POA: Diagnosis not present

## 2021-07-17 DIAGNOSIS — E119 Type 2 diabetes mellitus without complications: Secondary | ICD-10-CM | POA: Diagnosis not present

## 2021-07-18 DIAGNOSIS — E119 Type 2 diabetes mellitus without complications: Secondary | ICD-10-CM | POA: Diagnosis not present

## 2021-07-18 LAB — COMPLIANCE DRUG ANALYSIS, UR

## 2021-07-19 DIAGNOSIS — E119 Type 2 diabetes mellitus without complications: Secondary | ICD-10-CM | POA: Diagnosis not present

## 2021-07-20 DIAGNOSIS — E119 Type 2 diabetes mellitus without complications: Secondary | ICD-10-CM | POA: Diagnosis not present

## 2021-07-23 DIAGNOSIS — E119 Type 2 diabetes mellitus without complications: Secondary | ICD-10-CM | POA: Diagnosis not present

## 2021-07-24 DIAGNOSIS — E119 Type 2 diabetes mellitus without complications: Secondary | ICD-10-CM | POA: Diagnosis not present

## 2021-07-25 ENCOUNTER — Telehealth: Payer: Self-pay | Admitting: Family Medicine

## 2021-07-25 DIAGNOSIS — E119 Type 2 diabetes mellitus without complications: Secondary | ICD-10-CM | POA: Diagnosis not present

## 2021-07-25 NOTE — Telephone Encounter (Signed)
Attempted to contact patient - NA °

## 2021-07-25 NOTE — Telephone Encounter (Signed)
Results as expected - are we going to continue to prescribe medication?  ?

## 2021-07-25 NOTE — Telephone Encounter (Signed)
Patient aware and verbalizes understanding. 

## 2021-07-25 NOTE — Telephone Encounter (Signed)
Yes

## 2021-07-26 DIAGNOSIS — E119 Type 2 diabetes mellitus without complications: Secondary | ICD-10-CM | POA: Diagnosis not present

## 2021-07-27 DIAGNOSIS — E119 Type 2 diabetes mellitus without complications: Secondary | ICD-10-CM | POA: Diagnosis not present

## 2021-07-30 DIAGNOSIS — E119 Type 2 diabetes mellitus without complications: Secondary | ICD-10-CM | POA: Diagnosis not present

## 2021-07-31 DIAGNOSIS — E119 Type 2 diabetes mellitus without complications: Secondary | ICD-10-CM | POA: Diagnosis not present

## 2021-08-01 DIAGNOSIS — E119 Type 2 diabetes mellitus without complications: Secondary | ICD-10-CM | POA: Diagnosis not present

## 2021-08-02 DIAGNOSIS — E119 Type 2 diabetes mellitus without complications: Secondary | ICD-10-CM | POA: Diagnosis not present

## 2021-08-03 DIAGNOSIS — E119 Type 2 diabetes mellitus without complications: Secondary | ICD-10-CM | POA: Diagnosis not present

## 2021-08-06 DIAGNOSIS — E119 Type 2 diabetes mellitus without complications: Secondary | ICD-10-CM | POA: Diagnosis not present

## 2021-08-07 ENCOUNTER — Other Ambulatory Visit: Payer: Self-pay | Admitting: Radiology

## 2021-08-07 DIAGNOSIS — E119 Type 2 diabetes mellitus without complications: Secondary | ICD-10-CM | POA: Diagnosis not present

## 2021-08-07 MED ORDER — MELOXICAM 7.5 MG PO TABS: 7.5000 mg | ORAL_TABLET | Freq: Every day | ORAL | 2 refills | Status: DC

## 2021-08-07 NOTE — Telephone Encounter (Signed)
Refill of arthritis medication requested.  Per chart last time you saw patient was Feb 2022. ?

## 2021-08-08 DIAGNOSIS — E119 Type 2 diabetes mellitus without complications: Secondary | ICD-10-CM | POA: Diagnosis not present

## 2021-08-09 DIAGNOSIS — E119 Type 2 diabetes mellitus without complications: Secondary | ICD-10-CM | POA: Diagnosis not present

## 2021-08-10 DIAGNOSIS — E119 Type 2 diabetes mellitus without complications: Secondary | ICD-10-CM | POA: Diagnosis not present

## 2021-08-13 DIAGNOSIS — E119 Type 2 diabetes mellitus without complications: Secondary | ICD-10-CM | POA: Diagnosis not present

## 2021-08-14 DIAGNOSIS — E119 Type 2 diabetes mellitus without complications: Secondary | ICD-10-CM | POA: Diagnosis not present

## 2021-08-15 DIAGNOSIS — E119 Type 2 diabetes mellitus without complications: Secondary | ICD-10-CM | POA: Diagnosis not present

## 2021-08-16 DIAGNOSIS — E119 Type 2 diabetes mellitus without complications: Secondary | ICD-10-CM | POA: Diagnosis not present

## 2021-08-17 DIAGNOSIS — E119 Type 2 diabetes mellitus without complications: Secondary | ICD-10-CM | POA: Diagnosis not present

## 2021-08-20 DIAGNOSIS — E119 Type 2 diabetes mellitus without complications: Secondary | ICD-10-CM | POA: Diagnosis not present

## 2021-08-21 DIAGNOSIS — E119 Type 2 diabetes mellitus without complications: Secondary | ICD-10-CM | POA: Diagnosis not present

## 2021-08-22 DIAGNOSIS — E119 Type 2 diabetes mellitus without complications: Secondary | ICD-10-CM | POA: Diagnosis not present

## 2021-08-23 DIAGNOSIS — E119 Type 2 diabetes mellitus without complications: Secondary | ICD-10-CM | POA: Diagnosis not present

## 2021-08-24 DIAGNOSIS — E119 Type 2 diabetes mellitus without complications: Secondary | ICD-10-CM | POA: Diagnosis not present

## 2021-08-27 DIAGNOSIS — E119 Type 2 diabetes mellitus without complications: Secondary | ICD-10-CM | POA: Diagnosis not present

## 2021-08-28 DIAGNOSIS — E119 Type 2 diabetes mellitus without complications: Secondary | ICD-10-CM | POA: Diagnosis not present

## 2021-08-29 DIAGNOSIS — E119 Type 2 diabetes mellitus without complications: Secondary | ICD-10-CM | POA: Diagnosis not present

## 2021-08-30 DIAGNOSIS — E119 Type 2 diabetes mellitus without complications: Secondary | ICD-10-CM | POA: Diagnosis not present

## 2021-08-31 DIAGNOSIS — E119 Type 2 diabetes mellitus without complications: Secondary | ICD-10-CM | POA: Diagnosis not present

## 2021-09-03 DIAGNOSIS — E119 Type 2 diabetes mellitus without complications: Secondary | ICD-10-CM | POA: Diagnosis not present

## 2021-09-04 DIAGNOSIS — E119 Type 2 diabetes mellitus without complications: Secondary | ICD-10-CM | POA: Diagnosis not present

## 2021-09-05 DIAGNOSIS — E119 Type 2 diabetes mellitus without complications: Secondary | ICD-10-CM | POA: Diagnosis not present

## 2021-09-06 DIAGNOSIS — E119 Type 2 diabetes mellitus without complications: Secondary | ICD-10-CM | POA: Diagnosis not present

## 2021-09-07 DIAGNOSIS — E119 Type 2 diabetes mellitus without complications: Secondary | ICD-10-CM | POA: Diagnosis not present

## 2021-09-10 DIAGNOSIS — E119 Type 2 diabetes mellitus without complications: Secondary | ICD-10-CM | POA: Diagnosis not present

## 2021-09-11 DIAGNOSIS — E119 Type 2 diabetes mellitus without complications: Secondary | ICD-10-CM | POA: Diagnosis not present

## 2021-09-12 DIAGNOSIS — E119 Type 2 diabetes mellitus without complications: Secondary | ICD-10-CM | POA: Diagnosis not present

## 2021-09-13 DIAGNOSIS — E119 Type 2 diabetes mellitus without complications: Secondary | ICD-10-CM | POA: Diagnosis not present

## 2021-09-14 DIAGNOSIS — E119 Type 2 diabetes mellitus without complications: Secondary | ICD-10-CM | POA: Diagnosis not present

## 2021-09-17 DIAGNOSIS — E119 Type 2 diabetes mellitus without complications: Secondary | ICD-10-CM | POA: Diagnosis not present

## 2021-09-18 DIAGNOSIS — E119 Type 2 diabetes mellitus without complications: Secondary | ICD-10-CM | POA: Diagnosis not present

## 2021-09-19 DIAGNOSIS — E119 Type 2 diabetes mellitus without complications: Secondary | ICD-10-CM | POA: Diagnosis not present

## 2021-09-20 DIAGNOSIS — E119 Type 2 diabetes mellitus without complications: Secondary | ICD-10-CM | POA: Diagnosis not present

## 2021-09-21 DIAGNOSIS — E119 Type 2 diabetes mellitus without complications: Secondary | ICD-10-CM | POA: Diagnosis not present

## 2021-09-24 DIAGNOSIS — E119 Type 2 diabetes mellitus without complications: Secondary | ICD-10-CM | POA: Diagnosis not present

## 2021-09-25 DIAGNOSIS — E119 Type 2 diabetes mellitus without complications: Secondary | ICD-10-CM | POA: Diagnosis not present

## 2021-09-26 DIAGNOSIS — E119 Type 2 diabetes mellitus without complications: Secondary | ICD-10-CM | POA: Diagnosis not present

## 2021-09-27 DIAGNOSIS — E119 Type 2 diabetes mellitus without complications: Secondary | ICD-10-CM | POA: Diagnosis not present

## 2021-09-28 DIAGNOSIS — E119 Type 2 diabetes mellitus without complications: Secondary | ICD-10-CM | POA: Diagnosis not present

## 2021-10-01 ENCOUNTER — Other Ambulatory Visit: Payer: Self-pay | Admitting: Family Medicine

## 2021-10-01 DIAGNOSIS — F331 Major depressive disorder, recurrent, moderate: Secondary | ICD-10-CM

## 2021-10-01 DIAGNOSIS — G479 Sleep disorder, unspecified: Secondary | ICD-10-CM

## 2021-10-01 DIAGNOSIS — E119 Type 2 diabetes mellitus without complications: Secondary | ICD-10-CM | POA: Diagnosis not present

## 2021-10-02 ENCOUNTER — Ambulatory Visit (INDEPENDENT_AMBULATORY_CARE_PROVIDER_SITE_OTHER): Payer: Medicare HMO | Admitting: Family Medicine

## 2021-10-02 ENCOUNTER — Encounter: Payer: Self-pay | Admitting: Family Medicine

## 2021-10-02 VITALS — BP 128/81 | HR 89 | Temp 98.2°F | Ht 64.0 in | Wt 160.8 lb

## 2021-10-02 DIAGNOSIS — E782 Mixed hyperlipidemia: Secondary | ICD-10-CM

## 2021-10-02 DIAGNOSIS — F411 Generalized anxiety disorder: Secondary | ICD-10-CM | POA: Diagnosis not present

## 2021-10-02 DIAGNOSIS — Z720 Tobacco use: Secondary | ICD-10-CM

## 2021-10-02 DIAGNOSIS — I10 Essential (primary) hypertension: Secondary | ICD-10-CM

## 2021-10-02 DIAGNOSIS — E119 Type 2 diabetes mellitus without complications: Secondary | ICD-10-CM | POA: Diagnosis not present

## 2021-10-02 DIAGNOSIS — R7303 Prediabetes: Secondary | ICD-10-CM | POA: Diagnosis not present

## 2021-10-02 DIAGNOSIS — G4762 Sleep related leg cramps: Secondary | ICD-10-CM

## 2021-10-02 DIAGNOSIS — F331 Major depressive disorder, recurrent, moderate: Secondary | ICD-10-CM | POA: Diagnosis not present

## 2021-10-02 DIAGNOSIS — Z0001 Encounter for general adult medical examination with abnormal findings: Secondary | ICD-10-CM | POA: Diagnosis not present

## 2021-10-02 DIAGNOSIS — Z0000 Encounter for general adult medical examination without abnormal findings: Secondary | ICD-10-CM | POA: Diagnosis not present

## 2021-10-02 DIAGNOSIS — G479 Sleep disorder, unspecified: Secondary | ICD-10-CM | POA: Diagnosis not present

## 2021-10-02 DIAGNOSIS — M8589 Other specified disorders of bone density and structure, multiple sites: Secondary | ICD-10-CM

## 2021-10-02 DIAGNOSIS — M159 Polyosteoarthritis, unspecified: Secondary | ICD-10-CM

## 2021-10-02 DIAGNOSIS — B372 Candidiasis of skin and nail: Secondary | ICD-10-CM | POA: Diagnosis not present

## 2021-10-02 DIAGNOSIS — R002 Palpitations: Secondary | ICD-10-CM

## 2021-10-02 DIAGNOSIS — Z79899 Other long term (current) drug therapy: Secondary | ICD-10-CM

## 2021-10-02 DIAGNOSIS — R69 Illness, unspecified: Secondary | ICD-10-CM | POA: Diagnosis not present

## 2021-10-02 LAB — BAYER DCA HB A1C WAIVED: HB A1C (BAYER DCA - WAIVED): 5.3 % (ref 4.8–5.6)

## 2021-10-02 MED ORDER — TRAZODONE HCL 50 MG PO TABS: 50.0000 mg | ORAL_TABLET | Freq: Every day | ORAL | 1 refills | Status: DC

## 2021-10-02 MED ORDER — NYSTATIN 100000 UNIT/GM EX POWD: 1.0000 "application " | Freq: Three times a day (TID) | CUTANEOUS | 0 refills | Status: DC

## 2021-10-02 MED ORDER — TRAMADOL HCL 50 MG PO TABS: 100.0000 mg | ORAL_TABLET | Freq: Two times a day (BID) | ORAL | 2 refills | Status: DC

## 2021-10-02 NOTE — Patient Instructions (Signed)
Schedule an appointment to have an eye exam and see the dentist.

## 2021-10-02 NOTE — Progress Notes (Signed)
Assessment & Plan:  1. Well adult exam Preventive healthy education provided. Encouraged to schedule appointments with her eye doctor and dentist. - CMP14+EGFR - Anemia Profile B - Lipid panel  2. Moderate episode of recurrent major depressive disorder (HCC) Well controlled on current regimen.  - traZODone (DESYREL) 50 MG tablet; Take 1 tablet (50 mg total) by mouth at bedtime.  Dispense: 90 tablet; Refill: 1 - CMP14+EGFR - Anemia Profile B  3. GAD (generalized anxiety disorder) Well controlled on current regimen.  - CMP14+EGFR - Anemia Profile B  4. Difficulty sleeping Well controlled on current regimen.  - traZODone (DESYREL) 50 MG tablet; Take 1 tablet (50 mg total) by mouth at bedtime.  Dispense: 90 tablet; Refill: 1 - CMP14+EGFR  5. Essential hypertension Well controlled on current regimen.  - CMP14+EGFR - Anemia Profile B - Lipid panel  6. Mixed hyperlipidemia Well controlled on current regimen.  - CMP14+EGFR - Lipid panel  7. Prediabetes - Bayer DCA Hb A1c Waived  8-9. Primary osteoarthritis involving multiple joints/Controlled substance agreement signed Well controlled on current regimen. Controlled substance agreement in place. Urine drug screen as expected. PDMP reviewed with no concerning findings.  - traMADol (ULTRAM) 50 MG tablet; Take 2 tablets (100 mg total) by mouth every 12 (twelve) hours.  Dispense: 120 tablet; Refill: 2 - CMP14+EGFR  10. Osteopenia of multiple sites Continue calcium supplement. - CALCIUM PO; Take by mouth daily.  11. Nocturnal leg cramps - CMP14+EGFR - Anemia Profile B - Magnesium - TSH  12. Tobacco use Encouraged smoking cessation. Patient declined lung cancer screening.  13. Fluttering sensation of heart - CMP14+EGFR - Magnesium - EKG 12-Lead  14. Candidal intertrigo - nystatin (MYCOSTATIN/NYSTOP) powder; Apply 1 application. topically 3 (three) times daily.  Dispense: 60 g; Refill: 0   Follow-up: Return in  about 3 months (around 01/02/2022) for follow-up of chronic medication conditions.   Hendricks Limes, MSN, APRN, FNP-C [facility]  Subjective:  Patient ID: GWENIVERE HIRALDO, female    DOB: [birth year]  Age: 76 y.o. MRN: [id number]  Patient Care Team: Loman Brooklyn, FNP as PCP - General (Family Medicine) Magrinat, Virgie Dad, MD (Inactive) as Consulting Physician (Oncology) Neale Burly, MD (Internal Medicine) Milus Height, MD as Consulting Physician (Internal Medicine)   CC:  Chief Complaint  Patient presents with   Annual Exam   leg cramps    Bilateral leg cramps after waking up in the morning for a few weeks.     HPI LOWELLA KINDLEY presents for her annual physical.   Occupation: retired, Marital status: Single, Substance use: None Diet: regular, Exercise: walking Last eye exam: unknown Last dental exam: unknown DEXA: 07/04/2021 with osteopenia Lung Cancer Screening with low-dose Chest CT: declined Hepatitis C Screening: Negative on 07/04/2021 Immunizations: Flu Vaccine: up to date Tdap Vaccine: up to date  Shingrix Vaccine: up to date  COVID-19 Vaccine:  Declined booster Pneumonia Vaccine: up to date  Advanced Directives Patient does not have advanced directives including DNR, living will, healthcare power of attorney, financial power of attorney, and MOST form.   Depression/Anxiety: patient started Trazodone three months ago to further control sleep and depression. This is working for her.     10/02/2021    9:08 AM 07/04/2021    8:16 AM 04/03/2021    9:42 AM  Depression screen PHQ 2/9  Decreased Interest 1 3 2   Down, Depressed, Hopeless 2 2 1   PHQ - 2 Score 3 5 3  Altered sleeping 1 1 1   Tired, decreased energy 2 2 1   Change in appetite 1 0 0  Feeling bad or failure about yourself  2 1 0  Trouble concentrating 2 1 1   Moving slowly or fidgety/restless 2 0 0  Suicidal thoughts 0 0 0  PHQ-9 Score 13 10 6   Difficult doing work/chores  Somewhat difficult Not difficult at all Somewhat difficult      10/02/2021    9:09 AM 07/04/2021    8:16 AM 04/03/2021    9:44 AM 12/27/2020    3:24 PM  GAD 7 : Generalized Anxiety Score  Nervous, Anxious, on Edge 3 3 1 3   Control/stop worrying 2 2 1 2   Worry too much - different things 2 2 1 2   Trouble relaxing 3 2 0 2  Restless 1 2 0 1  Easily annoyed or irritable 3 3 1 2   Afraid - awful might happen 1 2 0 0  Total GAD 7 Score 15 16 4 12   Anxiety Difficulty Somewhat difficult Not difficult at all Somewhat difficult Very difficult    Review of Systems  Constitutional:  Negative for chills, fever, malaise/fatigue and weight loss.  HENT:  Negative for congestion, ear discharge, ear pain, nosebleeds, sinus pain, sore throat and tinnitus.   Eyes:  Positive for blurred vision. Negative for double vision, pain, discharge and redness.  Respiratory:  Negative for cough, shortness of breath and wheezing.   Cardiovascular:  Negative for chest pain, palpitations and leg swelling.       Fluttering sensation of heart has occurred a few times in the past few weeks. Only lasts a few seconds and resolves. No chest pain or shortness of breath.  Gastrointestinal:  Negative for abdominal pain, constipation, diarrhea, heartburn, nausea and vomiting.  Genitourinary:  Negative for dysuria, frequency and urgency.  Musculoskeletal:  Negative for myalgias.       Bilateral leg cramps upon waking for several weeks.  Skin:  Negative for rash.  Neurological:  Negative for dizziness, seizures, weakness and headaches.  Psychiatric/Behavioral:  Negative for depression, substance abuse and suicidal ideas. The patient is not nervous/anxious.     Current Outpatient Medications:    albuterol (VENTOLIN HFA) 108 (90 Base) MCG/ACT inhaler, Inhale 2 puffs into the lungs every 6 (six) hours as needed., Disp: 18 g, Rfl: 2   Cholecalciferol (VITAMIN D3) 1000 units CAPS, Take by mouth., Disp: , Rfl:    diclofenac Sodium  (VOLTAREN) 1 % GEL, Apply 2 g topically 4 (four) times daily., Disp: 350 g, Rfl: 2   escitalopram (LEXAPRO) 20 MG tablet, Take 1 tablet (20 mg total) by mouth daily., Disp: 90 tablet, Rfl: 1   hydrochlorothiazide (HYDRODIURIL) 25 MG tablet, Take 1 tablet (25 mg total) by mouth daily as needed., Disp: 90 tablet, Rfl: 1   hydrocortisone 2.5 % cream, Apply topically 2 (two) times daily., Disp: 453.6 g, Rfl: 2   meloxicam (MOBIC) 7.5 MG tablet, Take 1 tablet (7.5 mg total) by mouth daily., Disp: 30 tablet, Rfl: 2   nystatin cream (MYCOSTATIN), Apply 1 application topically 2 (two) times daily., Disp: 30 g, Rfl: 1   rosuvastatin (CRESTOR) 20 MG tablet, Take 1 tablet (20 mg total) by mouth daily., Disp: 90 tablet, Rfl: 1   traZODone (DESYREL) 50 MG tablet, Take 1-2 tablets (50-100 mg total) by mouth at bedtime as needed for sleep., Disp: 30 tablet, Rfl: 3   triamcinolone (KENALOG) 0.025 % ointment, APPLY 1 APPLICATION TOPICALLY 2 TIMES A  DAY., Disp: 80 g, Rfl: 1   traMADol (ULTRAM) 50 MG tablet, Take 2 tablets by mouth every 12 (twelve) hours., Disp: , Rfl:   No Known Allergies  Past Medical History:  Diagnosis Date   Anxiety    Arthritis    Hyperlipidemia    Hypertension    Osteopenia 07/04/2021   Prediabetes 07/09/2021   Psoriasis     Past Surgical History:  Procedure Laterality Date   ABDOMINAL HYSTERECTOMY     CHOLECYSTECTOMY     HERNIA REPAIR     KNEE SURGERY     TUBAL LIGATION      History reviewed. No pertinent family history.  Social History   Socioeconomic History   Marital status: Single    Spouse name: Not on file   Number of children: 2   Years of education: Not on file   Highest education level: 9th grade  Occupational History   Occupation: Retired  Tobacco Use   Smoking status: Every Day    Packs/day: 0.50    Years: 40.00    Pack years: 20.00    Types: Cigarettes   Smokeless tobacco: Never  Vaping Use   Vaping Use: Never used  Substance and Sexual  Activity   Alcohol use: No   Drug use: No   Sexual activity: Not Currently    Birth control/protection: Surgical  Other Topics Concern   Not on file  Social History Narrative   She lives alone on ground level apartment - has children. Daughter, [name] is her main caregiver and means for transportation.   Social Determinants of Health   Financial Resource Strain: Low Risk    Difficulty of Paying Living Expenses: Not very hard  Food Insecurity: No Food Insecurity   Worried About Charity fundraiser in the Last Year: Never true   Ran Out of Food in the Last Year: Never true  Transportation Needs: Unmet Transportation Needs   Lack of Transportation (Medical): Yes   Lack of Transportation (Non-Medical): No  Physical Activity: Inactive   Days of Exercise per Week: 0 days   Minutes of Exercise per Session: 0 min  Stress: Stress Concern Present   Feeling of Stress : To some extent  Social Connections: Moderately Isolated   Frequency of Communication with Friends and Family: More than three times a week   Frequency of Social Gatherings with Friends and Family: Once a week   Attends Religious Services: 1 to 4 times per year   Active Member of [employer] or Organizations: No   Attends Archivist Meetings: Never   Marital Status: Divorced  Human resources officer Violence: Not At Risk   Fear of Current or Ex-Partner: No   Emotionally Abused: No   Physically Abused: No   Sexually Abused: No      Objective:    BP 128/81   Pulse 89   Temp 98.2 F (36.8 C) (Temporal)   Ht 5' 4"  (1.626 m)   Wt 160 lb 12.8 oz (72.9 kg)   SpO2 92%   BMI 27.60 kg/m   Wt Readings from Last 3 Encounters:  10/02/21 160 lb 12.8 oz (72.9 kg)  07/04/21 165 lb (74.8 kg)  04/03/21 171 lb (77.6 kg)    Physical Exam Vitals reviewed.  Constitutional:      General: She is not in acute distress.    Appearance: Normal appearance. She is overweight. She is not ill-appearing, toxic-appearing or diaphoretic.   HENT:     Head: Normocephalic and atraumatic.  Right Ear: Tympanic membrane, ear canal and external ear normal. There is no impacted cerumen.     Left Ear: Tympanic membrane, ear canal and external ear normal. There is no impacted cerumen.     Nose: Nose normal. No congestion or rhinorrhea.     Mouth/Throat:     Mouth: Mucous membranes are moist.     Pharynx: Oropharynx is clear. No oropharyngeal exudate or posterior oropharyngeal erythema.  Eyes:     General: No scleral icterus.       Right eye: No discharge.        Left eye: No discharge.     Conjunctiva/sclera: Conjunctivae normal.     Pupils: Pupils are equal, round, and reactive to light.  Cardiovascular:     Rate and Rhythm: Normal rate and regular rhythm.     Heart sounds: Normal heart sounds. No murmur heard.   No friction rub. No gallop.  Pulmonary:     Effort: Pulmonary effort is normal. No respiratory distress.     Breath sounds: Normal breath sounds. No stridor. No wheezing, rhonchi or rales.  Abdominal:     General: Abdomen is flat. Bowel sounds are normal. There is no distension.     Palpations: Abdomen is soft. There is no hepatomegaly, splenomegaly or mass.     Tenderness: There is no abdominal tenderness. There is no guarding or rebound.     Hernia: No hernia is present.  Musculoskeletal:        General: Normal range of motion.     Cervical back: Normal range of motion and neck supple. No rigidity. No muscular tenderness.  Lymphadenopathy:     Cervical: No cervical adenopathy.  Skin:    General: Skin is warm and dry.     Capillary Refill: Capillary refill takes less than 2 seconds.     Findings: Erythema (under both breasts) present.  Neurological:     General: No focal deficit present.     Mental Status: She is alert and oriented to person, place, and time. Mental status is at baseline.  Psychiatric:        Mood and Affect: Mood normal.        Behavior: Behavior normal.        Thought Content: Thought  content normal.        Judgment: Judgment normal.   EKG: sinus rhythm; HR 85  Lab Results  Component Value Date   TSH 1.270 06/21/2020   Lab Results  Component Value Date   WBC 16.5 (H) 07/04/2021   HGB 14.4 07/04/2021   HCT 43.4 07/04/2021   MCV 99 (H) 07/04/2021   PLT 302 07/04/2021   Lab Results  Component Value Date   NA 142 07/04/2021   K 4.2 07/04/2021   CO2 25 07/04/2021   GLUCOSE 125 (H) 07/04/2021   BUN 14 07/04/2021   CREATININE 0.94 07/04/2021   BILITOT 0.3 07/04/2021   ALKPHOS 87 07/04/2021   AST 24 07/04/2021   ALT 14 07/04/2021   PROT 6.4 07/04/2021   ALBUMIN 4.3 07/04/2021   CALCIUM 9.9 07/04/2021   ANIONGAP 8 05/08/2018   EGFR 63 07/04/2021   Lab Results  Component Value Date   CHOL 153 07/04/2021   Lab Results  Component Value Date   HDL 60 07/04/2021   Lab Results  Component Value Date   LDLCALC 58 07/04/2021   Lab Results  Component Value Date   TRIG 218 (H) 07/04/2021   Lab Results  Component Value Date  CHOLHDL 2.6 07/04/2021   Lab Results  Component Value Date   HGBA1C 5.7 (H) 07/04/2021

## 2021-10-03 DIAGNOSIS — E119 Type 2 diabetes mellitus without complications: Secondary | ICD-10-CM | POA: Diagnosis not present

## 2021-10-03 LAB — ANEMIA PROFILE B
Basophils Absolute: 0.1 10*3/uL (ref 0.0–0.2)
Basos: 1 %
EOS (ABSOLUTE): 0.5 10*3/uL — ABNORMAL HIGH (ref 0.0–0.4)
Eos: 3 %
Ferritin: 597 ng/mL — ABNORMAL HIGH (ref 15–150)
Folate: >20 ng/mL (ref >3.0)
Hematocrit: 39.5 % (ref 34.0–46.6)
Hemoglobin: 13.3 g/dL (ref 11.1–15.9)
Immature Grans (Abs): 0 10*3/uL (ref 0.0–0.1)
Immature Granulocytes: 0 %
Iron Saturation: 27 % (ref 15–55)
Iron: 84 ug/dL (ref 27–139)
Lymphocytes Absolute: 3.1 10*3/uL (ref 0.7–3.1)
Lymphs: 23 %
MCH: 34.1 pg — ABNORMAL HIGH (ref 26.6–33.0)
MCHC: 33.7 g/dL (ref 31.5–35.7)
MCV: 101 fL — ABNORMAL HIGH (ref 79–97)
Monocytes Absolute: 0.9 10*3/uL (ref 0.1–0.9)
Monocytes: 7 %
Neutrophils Absolute: 9.1 10*3/uL — ABNORMAL HIGH (ref 1.4–7.0)
Neutrophils: 66 %
Platelets: 224 10*3/uL (ref 150–450)
RBC: 3.9 x10E6/uL (ref 3.77–5.28)
RDW: 12.3 % (ref 11.7–15.4)
Retic Ct Pct: 2.7 % — ABNORMAL HIGH (ref 0.6–2.6)
Total Iron Binding Capacity: 316 ug/dL (ref 250–450)
UIBC: 232 ug/dL (ref 118–369)
Vitamin B-12: 512 pg/mL (ref 232–1245)
WBC: 13.6 10*3/uL — ABNORMAL HIGH (ref 3.4–10.8)

## 2021-10-03 LAB — CMP14+EGFR
ALT: 12 IU/L (ref 0–32)
AST: 18 IU/L (ref 0–40)
Albumin/Globulin Ratio: 1.8 (ref 1.2–2.2)
Albumin: 4.2 g/dL (ref 3.7–4.7)
Alkaline Phosphatase: 62 IU/L (ref 44–121)
BUN/Creatinine Ratio: 25 (ref 12–28)
BUN: 26 mg/dL (ref 8–27)
Bilirubin Total: 0.4 mg/dL (ref 0.0–1.2)
CO2: 28 mmol/L (ref 20–29)
Calcium: 10.1 mg/dL (ref 8.7–10.3)
Chloride: 103 mmol/L (ref 96–106)
Creatinine, Ser: 1.06 mg/dL — ABNORMAL HIGH (ref 0.57–1.00)
Globulin, Total: 2.3 g/dL (ref 1.5–4.5)
Glucose: 114 mg/dL — ABNORMAL HIGH (ref 70–99)
Potassium: 5 mmol/L (ref 3.5–5.2)
Sodium: 143 mmol/L (ref 134–144)
Total Protein: 6.5 g/dL (ref 6.0–8.5)
eGFR: 54 mL/min/{1.73_m2} — ABNORMAL LOW (ref >59)

## 2021-10-03 LAB — MAGNESIUM: Magnesium: 1.5 mg/dL — ABNORMAL LOW (ref 1.6–2.3)

## 2021-10-03 LAB — LIPID PANEL
Chol/HDL Ratio: 2.9 ratio (ref 0.0–4.4)
Cholesterol, Total: 168 mg/dL (ref 100–199)
HDL: 57 mg/dL (ref >39)
LDL Chol Calc (NIH): 71 mg/dL (ref 0–99)
Triglycerides: 245 mg/dL — ABNORMAL HIGH (ref 0–149)
VLDL Cholesterol Cal: 40 mg/dL (ref 5–40)

## 2021-10-03 LAB — TSH: TSH: 0.847 u[IU]/mL (ref 0.450–4.500)

## 2021-10-04 DIAGNOSIS — E119 Type 2 diabetes mellitus without complications: Secondary | ICD-10-CM | POA: Diagnosis not present

## 2021-10-05 DIAGNOSIS — E119 Type 2 diabetes mellitus without complications: Secondary | ICD-10-CM | POA: Diagnosis not present

## 2021-10-09 DIAGNOSIS — E119 Type 2 diabetes mellitus without complications: Secondary | ICD-10-CM | POA: Diagnosis not present

## 2021-10-10 DIAGNOSIS — E119 Type 2 diabetes mellitus without complications: Secondary | ICD-10-CM | POA: Diagnosis not present

## 2021-10-11 DIAGNOSIS — E119 Type 2 diabetes mellitus without complications: Secondary | ICD-10-CM | POA: Diagnosis not present

## 2021-10-12 DIAGNOSIS — E119 Type 2 diabetes mellitus without complications: Secondary | ICD-10-CM | POA: Diagnosis not present

## 2021-10-15 ENCOUNTER — Encounter: Payer: Self-pay | Admitting: Emergency Medicine

## 2021-10-15 DIAGNOSIS — E119 Type 2 diabetes mellitus without complications: Secondary | ICD-10-CM | POA: Diagnosis not present

## 2021-10-16 DIAGNOSIS — E119 Type 2 diabetes mellitus without complications: Secondary | ICD-10-CM | POA: Diagnosis not present

## 2021-10-17 DIAGNOSIS — E119 Type 2 diabetes mellitus without complications: Secondary | ICD-10-CM | POA: Diagnosis not present

## 2021-10-18 DIAGNOSIS — E119 Type 2 diabetes mellitus without complications: Secondary | ICD-10-CM | POA: Diagnosis not present

## 2021-10-19 DIAGNOSIS — E119 Type 2 diabetes mellitus without complications: Secondary | ICD-10-CM | POA: Diagnosis not present

## 2021-10-22 DIAGNOSIS — E119 Type 2 diabetes mellitus without complications: Secondary | ICD-10-CM | POA: Diagnosis not present

## 2021-10-23 DIAGNOSIS — E119 Type 2 diabetes mellitus without complications: Secondary | ICD-10-CM | POA: Diagnosis not present

## 2021-10-24 DIAGNOSIS — E119 Type 2 diabetes mellitus without complications: Secondary | ICD-10-CM | POA: Diagnosis not present

## 2021-10-25 DIAGNOSIS — E119 Type 2 diabetes mellitus without complications: Secondary | ICD-10-CM | POA: Diagnosis not present

## 2021-10-26 DIAGNOSIS — E119 Type 2 diabetes mellitus without complications: Secondary | ICD-10-CM | POA: Diagnosis not present

## 2021-10-30 ENCOUNTER — Other Ambulatory Visit: Payer: Self-pay | Admitting: Family Medicine

## 2021-10-30 DIAGNOSIS — G479 Sleep disorder, unspecified: Secondary | ICD-10-CM

## 2021-10-30 DIAGNOSIS — F331 Major depressive disorder, recurrent, moderate: Secondary | ICD-10-CM

## 2021-10-30 DIAGNOSIS — E119 Type 2 diabetes mellitus without complications: Secondary | ICD-10-CM | POA: Diagnosis not present

## 2021-10-31 DIAGNOSIS — E119 Type 2 diabetes mellitus without complications: Secondary | ICD-10-CM | POA: Diagnosis not present

## 2021-11-01 DIAGNOSIS — E119 Type 2 diabetes mellitus without complications: Secondary | ICD-10-CM | POA: Diagnosis not present

## 2021-11-02 ENCOUNTER — Encounter: Payer: Self-pay | Admitting: Adult Health

## 2021-11-02 DIAGNOSIS — E119 Type 2 diabetes mellitus without complications: Secondary | ICD-10-CM | POA: Diagnosis not present

## 2021-11-02 NOTE — Progress Notes (Signed)
This encounter was created in error - please disregard.

## 2021-11-05 ENCOUNTER — Ambulatory Visit (INDEPENDENT_AMBULATORY_CARE_PROVIDER_SITE_OTHER): Payer: Medicare HMO

## 2021-11-05 VITALS — Wt 160.0 lb

## 2021-11-05 DIAGNOSIS — Z0000 Encounter for general adult medical examination without abnormal findings: Secondary | ICD-10-CM | POA: Diagnosis not present

## 2021-11-05 DIAGNOSIS — E119 Type 2 diabetes mellitus without complications: Secondary | ICD-10-CM | POA: Diagnosis not present

## 2021-11-05 NOTE — Progress Notes (Signed)
Subjective:   Denise Underwood is a 76 y.o. female who presents for Medicare Annual (Subsequent) preventive examination.  Virtual Visit via Telephone Note  I connected with  Denise Underwood on 11/05/21 at  1:15 PM EDT by telephone and verified that I am speaking with the correct person using two identifiers.  Location: Patient: Home Provider: [facility] Persons participating in the virtual visit: patient/Nurse Health Advisor   I discussed the limitations, risks, security and privacy concerns of performing an evaluation and management service by telephone and the availability of in person appointments. The patient expressed understanding and agreed to proceed.  Interactive audio and video telecommunications were attempted between this nurse and patient, however failed, due to patient having technical difficulties OR patient did not have access to video capability.  We continued and completed visit with audio only.  Some vital signs may be absent or patient reported.   Gurshan Settlemire E Garo Heidelberg, LPN   Review of Systems     Cardiac Risk Factors include: advanced age (>19men, >87 women);dyslipidemia;hypertension;sedentary lifestyle;smoking/ tobacco exposure     Objective:    Today's Vitals   11/05/21 1327  Weight: 160 lb (72.6 kg)  PainSc: 3    Body mass index is 27.46 kg/m.     11/05/2021    1:33 PM 11/02/2019   12:01 PM 10/02/2018    3:51 PM 05/08/2018    4:46 PM 04/21/2018    1:39 PM 04/16/2018   12:33 PM 04/13/2018   11:58 AM  Advanced Directives  Does Patient Have a Medical Advance Directive? Yes No No No No Yes Yes  Type of Estate agent of [place];Living will        Does patient want to make changes to medical advance directive?     No - Patient declined No - Patient declined No - Patient declined  Copy of Healthcare Power of Attorney in Chart? Yes - validated most recent copy scanned in chart (See row information)        Would patient like information on creating  a medical advance directive?  No - Patient declined No - Patient declined   No - Patient declined No - Patient declined    Current Medications (verified) Outpatient Encounter Medications as of 11/05/2021  Medication Sig   albuterol (VENTOLIN HFA) 108 (90 Base) MCG/ACT inhaler Inhale 2 puffs into the lungs every 6 (six) hours as needed.   CALCIUM PO Take by mouth daily.   Cholecalciferol (VITAMIN D3) 1000 units CAPS Take by mouth.   diclofenac Sodium (VOLTAREN) 1 % GEL Apply 2 g topically 4 (four) times daily.   escitalopram (LEXAPRO) 20 MG tablet Take 1 tablet (20 mg total) by mouth daily.   hydrochlorothiazide (HYDRODIURIL) 25 MG tablet Take 1 tablet (25 mg total) by mouth daily as needed.   hydrocortisone 2.5 % cream Apply topically 2 (two) times daily.   meloxicam (MOBIC) 7.5 MG tablet Take 1 tablet (7.5 mg total) by mouth daily.   nystatin (MYCOSTATIN/NYSTOP) powder Apply 1 application. topically 3 (three) times daily.   rosuvastatin (CRESTOR) 20 MG tablet Take 1 tablet (20 mg total) by mouth daily.   traMADol (ULTRAM) 50 MG tablet Take 2 tablets (100 mg total) by mouth every 12 (twelve) hours.   traZODone (DESYREL) 50 MG tablet Take 1 tablet (50 mg total) by mouth at bedtime.   triamcinolone (KENALOG) 0.025 % ointment APPLY 1 APPLICATION TOPICALLY 2 TIMES A DAY.   No facility-administered encounter medications on file as of  11/05/2021.    Allergies (verified) Patient has no known allergies.   History: Past Medical History:  Diagnosis Date   Anxiety    Arthritis    Hyperlipidemia    Hypertension    Osteopenia 07/04/2021   Prediabetes 07/09/2021   Psoriasis    Past Surgical History:  Procedure Laterality Date   ABDOMINAL HYSTERECTOMY     CHOLECYSTECTOMY     HERNIA REPAIR     KNEE SURGERY     TUBAL LIGATION     History reviewed. No pertinent family history. Social History   Socioeconomic History   Marital status: Single    Spouse name: Not on file   Number of  children: 2   Years of education: Not on file   Highest education level: 9th grade  Occupational History   Occupation: Retired  Tobacco Use   Smoking status: Every Day    Packs/day: 0.50    Years: 40.00    Total pack years: 20.00    Types: Cigarettes   Smokeless tobacco: Never   Tobacco comments:    Smoking since she was 29 or younger  Vaping Use   Vaping Use: Never used  Substance and Sexual Activity   Alcohol use: No   Drug use: No   Sexual activity: Not Currently    Birth control/protection: Surgical  Other Topics Concern   Not on file  Social History Narrative   She lives alone on ground level apartment - has children.    Daughter, Angelique BlonderDenise is her main caregiver and means for transportation.   She also has an aide that comes in for 2 hours on weekdays to help with bathing, meals, meds etc   Social Determinants of Health   Financial Resource Strain: Low Risk  (11/05/2021)   Overall Financial Resource Strain (CARDIA)    Difficulty of Paying Living Expenses: Not hard at all  Food Insecurity: No Food Insecurity (11/05/2021)   Hunger Vital Sign    Worried About Running Out of Food in the Last Year: Never true    Ran Out of Food in the Last Year: Never true  Transportation Needs: No Transportation Needs (11/05/2021)   PRAPARE - Administrator, Civil ServiceTransportation    Lack of Transportation (Medical): No    Lack of Transportation (Non-Medical): No  Physical Activity: Insufficiently Active (11/05/2021)   Exercise Vital Sign    Days of Exercise per Week: 7 days    Minutes of Exercise per Session: 10 min  Stress: No Stress Concern Present (11/05/2021)   [employer] - Occupational Stress Questionnaire    Feeling of Stress : Only a little  Social Connections: Socially Isolated (11/05/2021)   Social Connection and Isolation Panel [NHANES]    Frequency of Communication with Friends and Family: More than three times a week    Frequency of Social Gatherings with Friends and  Family: Twice a week    Attends Religious Services: Never    Database administratorActive Member of Clubs or Organizations: No    Attends Engineer, structuralClub or Organization Meetings: Never    Marital Status: Divorced    Tobacco Counseling Ready to quit: No Counseling given: Yes Tobacco comments: Smoking since she was 29 or younger   Clinical Intake:  Pre-visit preparation completed: Yes  Pain : 0-10 Pain Score: 3  Pain Type: Chronic pain Pain Location: Leg Pain Orientation: Right, Left Pain Descriptors / Indicators: Aching, Discomfort Pain Onset: More than a month ago Pain Frequency: Intermittent     BMI - recorded: 27.46 Nutritional  Status: BMI 25 -29 Overweight Nutritional Risks: None Diabetes: No  How often do you need to have someone help you when you read instructions, pamphlets, or other written materials from your doctor or pharmacy?: 1 - Never  Diabetic? no  Interpreter Needed?: No  Information entered by :: Angeni Chaudhuri, LPN   Activities of Daily Living    11/05/2021    1:33 PM  In your present state of health, do you have any difficulty performing the following activities:  Hearing? 1  Comment mild  Vision? 0  Difficulty concentrating or making decisions? 1  Walking or climbing stairs? 1  Dressing or bathing? 1  Comment aide assists  Doing errands, shopping? 1  Preparing Food and eating ? Y  Using the Toilet? N  In the past six months, have you accidently leaked urine? Y  Comment wears depends  Do you have problems with loss of bowel control? N  Managing your Medications? N  Managing your Finances? N  Housekeeping or managing your Housekeeping? Y    Patient Care Team: Loman Brooklyn, FNP as PCP - General (Family Medicine) Magrinat, Virgie Dad, MD (Inactive) as Consulting Physician (Oncology) Neale Burly, MD (Internal Medicine) Jacquiline Doe Marko Stai, MD as Consulting Physician (Internal Medicine)  Indicate any recent Medical Services you may have received from other than  [facility] providers in the past year (date may be approximate).     Assessment:   This is a routine wellness examination for Denise Underwood.  Hearing/Vision screen Hearing Screening - Comments:: C/o mild hearing difficulties  - declines hearing aids at this time Vision Screening - Comments:: Wears rx glasses - up to date with routine eye exams with [facility]  Dietary issues and exercise activities discussed: Current Exercise Habits: The patient does not participate in regular exercise at present, Exercise limited by: orthopedic condition(s);respiratory conditions(s)   Goals Addressed             This Visit's Progress    Prevent falls   Not on track      Depression Screen    11/05/2021    1:31 PM 10/02/2021    9:08 AM 07/04/2021    8:16 AM 04/03/2021    9:42 AM 12/27/2020    3:24 PM 11/02/2020    2:02 PM 09/26/2020    2:52 PM  PHQ 2/9 Scores  PHQ - 2 Score 2 3 5 3 4 2 4   PHQ- 9 Score 7 13 10 6 11 8 11     Fall Risk    11/05/2021    1:29 PM 10/02/2021    9:08 AM 07/04/2021    8:16 AM 04/03/2021    9:47 AM 12/27/2020    3:24 PM  Fall Risk   Falls in the past year? 1 0 0 1 1  Number falls in past yr: 1   1 0  Injury with Fall? 0   0 0  Risk for fall due to : History of fall(s);Impaired balance/gait;Orthopedic patient   Impaired balance/gait;History of fall(s)   Follow up Education provided;Falls prevention discussed    Falls prevention discussed    FALL RISK PREVENTION PERTAINING TO THE HOME:  Any stairs in or around the home? No  If so, are there any without handrails? No  Home free of loose throw rugs in walkways, pet beds, electrical cords, etc? Yes  Adequate lighting in your home to reduce risk of falls? Yes   ASSISTIVE DEVICES UTILIZED TO PREVENT FALLS:  Life alert? Yes  Use  of a cane, walker or w/c? Yes  Grab bars in the bathroom? Yes  Shower chair or bench in shower? Yes  Elevated toilet seat or a handicapped toilet? Yes   TIMED UP AND GO:  Was the test  performed? No . Telephonic visit  Cognitive Function:        11/05/2021    1:34 PM 11/02/2019   12:03 PM 10/02/2018    3:58 PM  6CIT Screen  What Year? 0 points 0 points 0 points  What month? 0 points 0 points 0 points  What time? 0 points 0 points 0 points  Count back from 20 4 points 0 points 0 points  Months in reverse 4 points 4 points 4 points  Repeat phrase 4 points 2 points 2 points  Total Score 12 points 6 points 6 points    Immunizations Immunization History  Administered Date(s) Administered   Fluad Quad(high Dose 65+) 03/01/2019, 03/01/2020, 04/03/2021   Influenza Inj Mdck Quad Pf 02/26/2017   Influenza, High Dose Seasonal PF 02/17/2018   Moderna Sars-Covid-2 Vaccination 01/18/2020, 02/15/2020   Pneumococcal Conjugate-13 05/20/2018   Pneumococcal Polysaccharide-23 03/01/2019   Tdap 12/27/2020   Zoster Recombinat (Shingrix) 12/27/2020, 04/03/2021    TDAP status: Up to date  Flu Vaccine status: Up to date  Pneumococcal vaccine status: Up to date  Covid-19 vaccine status: Completed vaccines  Qualifies for Shingles Vaccine? Yes   Zostavax completed Yes   Shingrix Completed?: Yes  Screening Tests Health Maintenance  Topic Date Due   URINE MICROALBUMIN  Never done   COVID-19 Vaccine (3 - Moderna risk series) 03/14/2020   INFLUENZA VACCINE  12/11/2021   DEXA SCAN  07/04/2024   TETANUS/TDAP  12/28/2030   Pneumonia Vaccine 73+ Years old  Completed   Hepatitis C Screening  Completed   Zoster Vaccines- Shingrix  Completed   HPV VACCINES  Aged Out   COLONOSCOPY (Pts 45-58yrs Insurance coverage will need to be confirmed)  Discontinued    Health Maintenance  Health Maintenance Due  Topic Date Due   URINE MICROALBUMIN  Never done   COVID-19 Vaccine (3 - Moderna risk series) 03/14/2020    Colorectal cancer screening: No longer required.   Mammogram status: No longer required due to age and she declines.  Bone Density status: Completed 07/04/2021. Results  reflect: Bone density results: OSTEOPENIA. Repeat every 3 years.  Lung Cancer Screening: (Low Dose CT Chest recommended if Age 6-80 years, 30 pack-year currently smoking OR have quit w/in 15years.) does qualify.   Lung Cancer Screening Referral: declines  Additional Screening:  Hepatitis C Screening: does qualify; Completed 07/04/2021  Vision Screening: Recommended annual ophthalmology exams for early detection of glaucoma and other disorders of the eye. Is the patient up to date with their annual eye exam?  Yes  Who is the provider or what is the name of the office in which the patient attends annual eye exams? [facility] If pt is not established with a provider, would they like to be referred to a provider to establish care? No .   Dental Screening: Recommended annual dental exams for proper oral hygiene  Community Resource Referral / Chronic Care Management: CRR required this visit?  No   CCM required this visit?  No      Plan:     I have personally reviewed and noted the following in the patient's chart:   Medical and social history Use of alcohol, tobacco or illicit drugs  Current medications and supplements including opioid  prescriptions.  Functional ability and status Nutritional status Physical activity Advanced directives List of other physicians Hospitalizations, surgeries, and ER visits in previous 12 months Vitals Screenings to include cognitive, depression, and falls Referrals and appointments  In addition, I have reviewed and discussed with patient certain preventive protocols, quality metrics, and best practice recommendations. A written personalized care plan for preventive services as well as general preventive health recommendations were provided to patient.     Sandrea Hammond, LPN   [id number]   Nurse Notes: 6CIT = 12

## 2021-11-06 DIAGNOSIS — E119 Type 2 diabetes mellitus without complications: Secondary | ICD-10-CM | POA: Diagnosis not present

## 2021-11-07 ENCOUNTER — Other Ambulatory Visit: Payer: Self-pay | Admitting: Orthopedic Surgery

## 2021-11-07 DIAGNOSIS — E119 Type 2 diabetes mellitus without complications: Secondary | ICD-10-CM | POA: Diagnosis not present

## 2021-11-07 NOTE — Telephone Encounter (Signed)
Patient daughter called requesting refill for her mothers arthritis medicine.   meloxicam (MOBIC) 7.5 MG tablet   [facility]

## 2021-11-08 DIAGNOSIS — E119 Type 2 diabetes mellitus without complications: Secondary | ICD-10-CM | POA: Diagnosis not present

## 2021-11-11 MED ORDER — MELOXICAM 7.5 MG PO TABS: 7.5000 mg | ORAL_TABLET | Freq: Every day | ORAL | 2 refills | Status: DC

## 2021-11-12 DIAGNOSIS — E119 Type 2 diabetes mellitus without complications: Secondary | ICD-10-CM | POA: Diagnosis not present

## 2021-11-14 DIAGNOSIS — E119 Type 2 diabetes mellitus without complications: Secondary | ICD-10-CM | POA: Diagnosis not present

## 2021-11-15 DIAGNOSIS — E119 Type 2 diabetes mellitus without complications: Secondary | ICD-10-CM | POA: Diagnosis not present

## 2021-11-16 DIAGNOSIS — E119 Type 2 diabetes mellitus without complications: Secondary | ICD-10-CM | POA: Diagnosis not present

## 2021-11-19 DIAGNOSIS — E119 Type 2 diabetes mellitus without complications: Secondary | ICD-10-CM | POA: Diagnosis not present

## 2021-11-20 DIAGNOSIS — E119 Type 2 diabetes mellitus without complications: Secondary | ICD-10-CM | POA: Diagnosis not present

## 2021-11-21 DIAGNOSIS — E119 Type 2 diabetes mellitus without complications: Secondary | ICD-10-CM | POA: Diagnosis not present

## 2021-11-22 DIAGNOSIS — E119 Type 2 diabetes mellitus without complications: Secondary | ICD-10-CM | POA: Diagnosis not present

## 2021-11-23 DIAGNOSIS — E119 Type 2 diabetes mellitus without complications: Secondary | ICD-10-CM | POA: Diagnosis not present

## 2021-11-26 DIAGNOSIS — E119 Type 2 diabetes mellitus without complications: Secondary | ICD-10-CM | POA: Diagnosis not present

## 2021-11-27 DIAGNOSIS — E119 Type 2 diabetes mellitus without complications: Secondary | ICD-10-CM | POA: Diagnosis not present

## 2021-11-28 DIAGNOSIS — E119 Type 2 diabetes mellitus without complications: Secondary | ICD-10-CM | POA: Diagnosis not present

## 2021-11-29 DIAGNOSIS — E119 Type 2 diabetes mellitus without complications: Secondary | ICD-10-CM | POA: Diagnosis not present

## 2021-11-30 DIAGNOSIS — E119 Type 2 diabetes mellitus without complications: Secondary | ICD-10-CM | POA: Diagnosis not present

## 2021-12-03 DIAGNOSIS — E119 Type 2 diabetes mellitus without complications: Secondary | ICD-10-CM | POA: Diagnosis not present

## 2021-12-04 DIAGNOSIS — E119 Type 2 diabetes mellitus without complications: Secondary | ICD-10-CM | POA: Diagnosis not present

## 2021-12-05 DIAGNOSIS — E119 Type 2 diabetes mellitus without complications: Secondary | ICD-10-CM | POA: Diagnosis not present

## 2021-12-06 DIAGNOSIS — E119 Type 2 diabetes mellitus without complications: Secondary | ICD-10-CM | POA: Diagnosis not present

## 2021-12-07 DIAGNOSIS — E119 Type 2 diabetes mellitus without complications: Secondary | ICD-10-CM | POA: Diagnosis not present

## 2021-12-10 DIAGNOSIS — E119 Type 2 diabetes mellitus without complications: Secondary | ICD-10-CM | POA: Diagnosis not present

## 2021-12-11 DIAGNOSIS — E119 Type 2 diabetes mellitus without complications: Secondary | ICD-10-CM | POA: Diagnosis not present

## 2021-12-12 DIAGNOSIS — E119 Type 2 diabetes mellitus without complications: Secondary | ICD-10-CM | POA: Diagnosis not present

## 2021-12-13 DIAGNOSIS — E119 Type 2 diabetes mellitus without complications: Secondary | ICD-10-CM | POA: Diagnosis not present

## 2022-01-03 ENCOUNTER — Ambulatory Visit (INDEPENDENT_AMBULATORY_CARE_PROVIDER_SITE_OTHER): Payer: Medicare HMO | Admitting: Family Medicine

## 2022-01-03 ENCOUNTER — Encounter: Payer: Self-pay | Admitting: Family Medicine

## 2022-01-03 VITALS — BP 120/93 | HR 123 | Temp 98.1°F | Ht 64.0 in | Wt 160.4 lb

## 2022-01-03 DIAGNOSIS — M159 Polyosteoarthritis, unspecified: Secondary | ICD-10-CM

## 2022-01-03 DIAGNOSIS — R7303 Prediabetes: Secondary | ICD-10-CM

## 2022-01-03 DIAGNOSIS — F411 Generalized anxiety disorder: Secondary | ICD-10-CM

## 2022-01-03 DIAGNOSIS — M8589 Other specified disorders of bone density and structure, multiple sites: Secondary | ICD-10-CM

## 2022-01-03 DIAGNOSIS — M25561 Pain in right knee: Secondary | ICD-10-CM

## 2022-01-03 DIAGNOSIS — G479 Sleep disorder, unspecified: Secondary | ICD-10-CM

## 2022-01-03 DIAGNOSIS — R69 Illness, unspecified: Secondary | ICD-10-CM | POA: Diagnosis not present

## 2022-01-03 DIAGNOSIS — Z79899 Other long term (current) drug therapy: Secondary | ICD-10-CM | POA: Diagnosis not present

## 2022-01-03 DIAGNOSIS — E7841 Elevated Lipoprotein(a): Secondary | ICD-10-CM

## 2022-01-03 DIAGNOSIS — Z9181 History of falling: Secondary | ICD-10-CM

## 2022-01-03 DIAGNOSIS — E612 Magnesium deficiency: Secondary | ICD-10-CM | POA: Diagnosis not present

## 2022-01-03 DIAGNOSIS — M25562 Pain in left knee: Secondary | ICD-10-CM

## 2022-01-03 DIAGNOSIS — I10 Essential (primary) hypertension: Secondary | ICD-10-CM | POA: Diagnosis not present

## 2022-01-03 DIAGNOSIS — F331 Major depressive disorder, recurrent, moderate: Secondary | ICD-10-CM

## 2022-01-03 DIAGNOSIS — G8929 Other chronic pain: Secondary | ICD-10-CM

## 2022-01-03 LAB — BAYER DCA HB A1C WAIVED: HB A1C (BAYER DCA - WAIVED): 5.3 % (ref 4.8–5.6)

## 2022-01-03 MED ORDER — ROSUVASTATIN CALCIUM 20 MG PO TABS: 20.0000 mg | ORAL_TABLET | Freq: Every day | ORAL | 1 refills | Status: DC

## 2022-01-03 MED ORDER — HYDROCHLOROTHIAZIDE 25 MG PO TABS: 25.0000 mg | ORAL_TABLET | Freq: Every day | ORAL | 1 refills | Status: DC | PRN

## 2022-01-03 MED ORDER — ESCITALOPRAM OXALATE 20 MG PO TABS: 20.0000 mg | ORAL_TABLET | Freq: Every day | ORAL | 1 refills | Status: DC

## 2022-01-03 MED ORDER — TRAMADOL HCL 50 MG PO TABS: 100.0000 mg | ORAL_TABLET | Freq: Two times a day (BID) | ORAL | 2 refills | Status: DC

## 2022-01-03 MED ORDER — TRAZODONE HCL 50 MG PO TABS: 50.0000 mg | ORAL_TABLET | Freq: Every day | ORAL | 1 refills | Status: DC

## 2022-01-03 NOTE — Progress Notes (Signed)
Assessment & Plan:  1. Prediabetes Resolved. - CBC with Differential/Platelet - Bayer DCA Hb A1c Waived  2. Elevated lipoprotein(a) Well controlled on current regimen.  - Lipid panel - rosuvastatin (CRESTOR) 20 MG tablet; Take 1 tablet (20 mg total) by mouth daily.  Dispense: 90 tablet; Refill: 1  3. Essential hypertension Well controlled on current regimen.  - hydrochlorothiazide (HYDRODIURIL) 25 MG tablet; Take 1 tablet (25 mg total) by mouth daily as needed.  Dispense: 90 tablet; Refill: 1 - CMP14+EGFR  4. GAD (generalized anxiety disorder) Well controlled on current regimen.  - escitalopram (LEXAPRO) 20 MG tablet; Take 1 tablet (20 mg total) by mouth daily.  Dispense: 90 tablet; Refill: 1 - CMP14+EGFR  5. Moderate episode of recurrent major depressive disorder (HCC) Well controlled on current regimen.  - traZODone (DESYREL) 50 MG tablet; Take 1 tablet (50 mg total) by mouth at bedtime.  Dispense: 90 tablet; Refill: 1 - CMP14+EGFR  6. Difficulty sleeping Well controlled on current regimen.  - traZODone (DESYREL) 50 MG tablet; Take 1 tablet (50 mg total) by mouth at bedtime.  Dispense: 90 tablet; Refill: 1 - CMP14+EGFR  7-9. Primary osteoarthritis involving multiple joints/Bilateral chronic knee pain/Controlled substance agreement signed Well controlled on current regimen. Controlled substance agreement in place. Urine drug screen as expected. PDMP reviewed with no concerning findings. Rolling walker ordered due to patient currently using a broken one that was given to her. - traMADol (ULTRAM) 50 MG tablet; Take 2 tablets (100 mg total) by mouth every 12 (twelve) hours.  Dispense: 120 tablet; Refill: 2 - CMP14+EGFR - For home use only DME 4 wheeled rolling walker with seat ([id number])  10. Osteopenia of multiple sites Encouraged to take calcium and vitamin D.  11. Hypomagnesemia Encouraged to start OTC supplement as previously directed. - Magnesium  12. History of  falling - For home use only DME 4 wheeled rolling walker with seat ([id number])   Return in about 3 months (around 04/05/2022) for follow-up of chronic medication conditions with T. Lilia Pro.  Hendricks Limes, MSN, APRN, FNP-C [facility]  Subjective:    Patient ID: Denise Underwood, female    DOB: 1946-02-24, 76 y.o.   MRN: [id number]  Patient Care Team: Loman Brooklyn, FNP as PCP - General (Family Medicine) Magrinat, Virgie Dad, MD (Inactive) as Consulting Physician (Oncology) Neale Burly, MD (Internal Medicine) Milus Height, MD as Consulting Physician (Internal Medicine)   Chief Complaint:  Chief Complaint  Patient presents with   Medical Management of Chronic Issues   Knee Pain    Bilateral chronic knee pain     HPI: Denise Underwood is a 76 y.o. female presenting on 01/03/2022 for Medical Management of Chronic Issues and Knee Pain (Bilateral chronic knee pain )  Pain assessment: Cause of pain- osteoarthritis Pain location- both knees Pain on scale of 1-10- 9/10 without medication, 3-4/10 with medication Frequency- daily What increases pain- standing and walking What makes pain better- sitting, medication Effects on ADL- makes it very difficult.  Any change in general medical condition- no  Current opioids rx- Tramadol 100 mg BID PRN  # meds rx- 120 Effectiveness of current meds- good Adverse reactions from pain meds- none Morphine equivalent- 40 MME/day  Pill count performed-No Last drug screen - 07/18/2021 ( high risk q27m moderate risk q618mlow risk yearly ) Urine drug [place]- No [place] If yes were their any concerning findings? - No  Overdose risk: 140  06/22/2020   12:21 PM  Opioid Risk   Alcohol 0  Illegal Drugs 0  Rx Drugs 0  Alcohol 0  Illegal Drugs 0  Rx Drugs 0  Age between 16-45 years  0  History of Preadolescent Sexual Abuse 0  Psychological Disease 0  Depression 0  Opioid Risk Tool  Scoring 0  Opioid Risk Interpretation Low Risk   Pain contract signed on: 07/04/2021  Wheezing:  using Albuterol rarely as long as she "stays out of the wind".  Anxiety/Depression: She states this is well managed on her Lexapro and Trazodone. Denies any bleeding due to concomitant use of meloxicam.      01/03/2022   10:16 AM 10/02/2021    9:09 AM 07/04/2021    8:16 AM 04/03/2021    9:44 AM  GAD 7 : Generalized Anxiety Score  Nervous, Anxious, on Edge 2 3 3 1   Control/stop worrying 2 2 2 1   Worry too much - different things 3 2 2 1   Trouble relaxing 2 3 2  0  Restless 2 1 2  0  Easily annoyed or irritable 1 3 3 1   Afraid - awful might happen 2 1 2  0  Total GAD 7 Score 14 15 16 4   Anxiety Difficulty Somewhat difficult Somewhat difficult Not difficult at all Somewhat difficult      01/03/2022   10:16 AM 11/05/2021    1:31 PM 10/02/2021    9:08 AM  Depression screen PHQ 2/9  Decreased Interest 2 1 1   Down, Depressed, Hopeless 1 1 2   PHQ - 2 Score 3 2 3   Altered sleeping 1 1 1   Tired, decreased energy 0 1 2  Change in appetite 2 0 1  Feeling bad or failure about yourself  2 1 2   Trouble concentrating 0 1 2  Moving slowly or fidgety/restless 0 1 2  Suicidal thoughts 0 0 0  PHQ-9 Score 8 7 13   Difficult doing work/chores Somewhat difficult Somewhat difficult Somewhat difficult   Hypertension: She is taking her blood pressure medication as prescribed. She is taking her blood pressures at home occasionally and obtaining readings 808U-110R systolic.  Osteopenia: taking calcium and vitamin D when she can afford them.   Hypomagnesemia: previously advised to start an OTC supplement to help with leg cramping which she has not done.  New complaints: None   Social history:  Relevant past medical, surgical, family and social history reviewed and updated as indicated. Interim medical history since our last visit reviewed.  Allergies and medications reviewed and updated.  DATA  REVIEWED: CHART IN EPIC  ROS: Negative unless specifically indicated above in HPI.    Current Outpatient Medications:    albuterol (VENTOLIN HFA) 108 (90 Base) MCG/ACT inhaler, Inhale 2 puffs into the lungs every 6 (six) hours as needed., Disp: 18 g, Rfl: 2   CALCIUM PO, Take by mouth daily., Disp: , Rfl:    diclofenac Sodium (VOLTAREN) 1 % GEL, Apply 2 g topically 4 (four) times daily., Disp: 350 g, Rfl: 2   escitalopram (LEXAPRO) 20 MG tablet, Take 1 tablet (20 mg total) by mouth daily., Disp: 90 tablet, Rfl: 1   hydrochlorothiazide (HYDRODIURIL) 25 MG tablet, Take 1 tablet (25 mg total) by mouth daily as needed., Disp: 90 tablet, Rfl: 1   hydrocortisone 2.5 % cream, Apply topically 2 (two) times daily., Disp: 453.6 g, Rfl: 2   meloxicam (MOBIC) 7.5 MG tablet, Take 1 tablet (7.5 mg total) by mouth daily., Disp: 30 tablet, Rfl: 2  nystatin (MYCOSTATIN/NYSTOP) powder, Apply 1 application. topically 3 (three) times daily., Disp: 60 g, Rfl: 0   rosuvastatin (CRESTOR) 20 MG tablet, Take 1 tablet (20 mg total) by mouth daily., Disp: 90 tablet, Rfl: 1   traMADol (ULTRAM) 50 MG tablet, Take 2 tablets (100 mg total) by mouth every 12 (twelve) hours., Disp: 120 tablet, Rfl: 2   traZODone (DESYREL) 50 MG tablet, Take 1 tablet (50 mg total) by mouth at bedtime., Disp: 90 tablet, Rfl: 1   triamcinolone (KENALOG) 0.025 % ointment, APPLY 1 APPLICATION TOPICALLY 2 TIMES A DAY., Disp: 80 g, Rfl: 1   Cholecalciferol (VITAMIN D3) 1000 units CAPS, Take by mouth. (Patient not taking: Reported on 01/03/2022), Disp: , Rfl:    No Known Allergies Past Medical History:  Diagnosis Date   Anxiety    Arthritis    Hyperlipidemia    Hypertension    Osteopenia 07/04/2021   Prediabetes 07/09/2021   Psoriasis     Past Surgical History:  Procedure Laterality Date   ABDOMINAL HYSTERECTOMY     CHOLECYSTECTOMY     HERNIA REPAIR     KNEE SURGERY     TUBAL LIGATION      Social History   Socioeconomic History    Marital status: Single    Spouse name: Not on file   Number of children: 2   Years of education: Not on file   Highest education level: 9th grade  Occupational History   Occupation: Retired  Tobacco Use   Smoking status: Every Day    Packs/day: 0.50    Years: 40.00    Total pack years: 20.00    Types: Cigarettes   Smokeless tobacco: Never   Tobacco comments:    Smoking since she was 47 or younger  Vaping Use   Vaping Use: Never used  Substance and Sexual Activity   Alcohol use: No   Drug use: No   Sexual activity: Not Currently    Birth control/protection: Surgical  Other Topics Concern   Not on file  Social History Narrative   She lives alone on ground level apartment - has children.    Daughter, [name] is her main caregiver and means for transportation.   She also has an aide that comes in for 2 hours on weekdays to help with bathing, meals, meds etc   Social Determinants of Health   Financial Resource Strain: Low Risk  (11/05/2021)   Overall Financial Resource Strain (CARDIA)    Difficulty of Paying Living Expenses: Not hard at all  Food Insecurity: No Food Insecurity (11/05/2021)   Hunger Vital Sign    Worried About Running Out of Food in the Last Year: Never true    [place] in the Last Year: Never true  Transportation Needs: No Transportation Needs (11/05/2021)   PRAPARE - Hydrologist (Medical): No    Lack of Transportation (Non-Medical): No  Physical Activity: Insufficiently Active (11/05/2021)   Exercise Vital Sign    Days of Exercise per Week: 7 days    Minutes of Exercise per Session: 10 min  Stress: No Stress Concern Present (11/05/2021)   [place]    Feeling of Stress : Only a little  Social Connections: Socially Isolated (11/05/2021)   Social Connection and Isolation Panel [NHANES]    Frequency of Communication with Friends and Family: More than three  times a week    Frequency of Social Gatherings with Friends  and Family: Twice a week    Attends Religious Services: Never    Active Member of Clubs or Organizations: No    Attends Archivist Meetings: Never    Marital Status: Divorced  Human resources officer Violence: Not At Risk (11/05/2021)   Humiliation, Afraid, Rape, and Kick questionnaire    Fear of Current or Ex-Partner: No    Emotionally Abused: No    Physically Abused: No    Sexually Abused: No        Objective:    BP (!) 120/93   Pulse (!) 123   Temp 98.1 F (36.7 C) (Temporal)   Ht 5' 4"  (1.626 m)   Wt 160 lb 6.4 oz (72.8 kg)   SpO2 90%   BMI 27.53 kg/m   Wt Readings from Last 3 Encounters:  01/03/22 160 lb 6.4 oz (72.8 kg)  11/05/21 160 lb (72.6 kg)  10/02/21 160 lb 12.8 oz (72.9 kg)    Physical Exam Vitals reviewed.  Constitutional:      General: She is not in acute distress.    Appearance: Normal appearance. She is overweight. She is not ill-appearing, toxic-appearing or diaphoretic.  HENT:     Head: Normocephalic and atraumatic.     Right Ear: Tympanic membrane, ear canal and external ear normal. There is no impacted cerumen.     Left Ear: Tympanic membrane, ear canal and external ear normal. There is no impacted cerumen.     Nose: Nose normal. No congestion or rhinorrhea.     Mouth/Throat:     Mouth: Mucous membranes are moist.     Pharynx: Oropharynx is clear. No oropharyngeal exudate or posterior oropharyngeal erythema.  Eyes:     General: No scleral icterus.       Right eye: No discharge.        Left eye: No discharge.     Conjunctiva/sclera: Conjunctivae normal.  Cardiovascular:     Rate and Rhythm: Normal rate and regular rhythm.     Heart sounds: Normal heart sounds. No murmur heard.    No friction rub. No gallop.  Pulmonary:     Effort: Pulmonary effort is normal. No respiratory distress.     Breath sounds: Normal breath sounds. No stridor. No wheezing, rhonchi or rales.   Musculoskeletal:        General: Normal range of motion.     Cervical back: Normal range of motion.  Lymphadenopathy:     Cervical: No cervical adenopathy.  Skin:    General: Skin is warm and dry.     Capillary Refill: Capillary refill takes less than 2 seconds.  Neurological:     General: No focal deficit present.     Mental Status: She is alert and oriented to person, place, and time. Mental status is at baseline.     Gait: Gait abnormal (using rolling walker with broken wheels).  Psychiatric:        Mood and Affect: Mood normal.        Behavior: Behavior normal.        Thought Content: Thought content normal.        Judgment: Judgment normal.     Lab Results  Component Value Date   TSH 0.847 10/02/2021   Lab Results  Component Value Date   WBC 13.6 (H) 10/02/2021   HGB 13.3 10/02/2021   HCT 39.5 10/02/2021   MCV 101 (H) 10/02/2021   PLT 224 10/02/2021   Lab Results  Component Value Date   NA  143 10/02/2021   K 5.0 10/02/2021   CO2 28 10/02/2021   GLUCOSE 114 (H) 10/02/2021   BUN 26 10/02/2021   CREATININE 1.06 (H) 10/02/2021   BILITOT 0.4 10/02/2021   ALKPHOS 62 10/02/2021   AST 18 10/02/2021   ALT 12 10/02/2021   PROT 6.5 10/02/2021   ALBUMIN 4.2 10/02/2021   CALCIUM 10.1 10/02/2021   ANIONGAP 8 05/08/2018   EGFR 54 (L) 10/02/2021   Lab Results  Component Value Date   CHOL 168 10/02/2021   Lab Results  Component Value Date   HDL 57 10/02/2021   Lab Results  Component Value Date   LDLCALC 71 10/02/2021   Lab Results  Component Value Date   TRIG 245 (H) 10/02/2021   Lab Results  Component Value Date   CHOLHDL 2.9 10/02/2021   Lab Results  Component Value Date   HGBA1C 5.3 10/02/2021

## 2022-01-03 NOTE — Patient Instructions (Signed)
Start taking over the counter magnesium 400 mg once daily for your leg cramps/spasms as your level is low.

## 2022-01-04 ENCOUNTER — Telehealth: Payer: Self-pay | Admitting: Family Medicine

## 2022-01-04 LAB — LIPID PANEL
Chol/HDL Ratio: 2.7 ratio (ref 0.0–4.4)
Cholesterol, Total: 165 mg/dL (ref 100–199)
HDL: 61 mg/dL (ref >39)
LDL Chol Calc (NIH): 71 mg/dL (ref 0–99)
Triglycerides: 198 mg/dL — ABNORMAL HIGH (ref 0–149)
VLDL Cholesterol Cal: 33 mg/dL (ref 5–40)

## 2022-01-04 LAB — CBC WITH DIFFERENTIAL/PLATELET
Basophils Absolute: 0.1 10*3/uL (ref 0.0–0.2)
Basos: 1 %
EOS (ABSOLUTE): 0.7 10*3/uL — ABNORMAL HIGH (ref 0.0–0.4)
Eos: 4 %
Hematocrit: 40.4 % (ref 34.0–46.6)
Hemoglobin: 13.1 g/dL (ref 11.1–15.9)
Immature Grans (Abs): 0 10*3/uL (ref 0.0–0.1)
Immature Granulocytes: 0 %
Lymphocytes Absolute: 5.2 10*3/uL — ABNORMAL HIGH (ref 0.7–3.1)
Lymphs: 33 %
MCH: 32.9 pg (ref 26.6–33.0)
MCHC: 32.4 g/dL (ref 31.5–35.7)
MCV: 102 fL — ABNORMAL HIGH (ref 79–97)
Monocytes Absolute: 0.9 10*3/uL (ref 0.1–0.9)
Monocytes: 6 %
Neutrophils Absolute: 8.9 10*3/uL — ABNORMAL HIGH (ref 1.4–7.0)
Neutrophils: 56 %
Platelets: 251 10*3/uL (ref 150–450)
RBC: 3.98 x10E6/uL (ref 3.77–5.28)
RDW: 11.6 % — ABNORMAL LOW (ref 11.7–15.4)
WBC: 15.9 10*3/uL — ABNORMAL HIGH (ref 3.4–10.8)

## 2022-01-04 NOTE — Telephone Encounter (Signed)
Pt called stating that she needs Britney to fax her wheelchair order with a demographics sheet to [facility] pharmacy at [phone]

## 2022-01-04 NOTE — Telephone Encounter (Signed)
Number not working  Was faxed this morning

## 2022-01-08 LAB — COMPREHENSIVE METABOLIC PANEL
ALT: 18 IU/L (ref 0–32)
AST: 19 IU/L (ref 0–40)
Albumin/Globulin Ratio: 1.9 (ref 1.2–2.2)
Albumin: 4.2 g/dL (ref 3.8–4.8)
Alkaline Phosphatase: 75 IU/L (ref 44–121)
BUN/Creatinine Ratio: 22 (ref 12–28)
BUN: 21 mg/dL (ref 8–27)
Bilirubin Total: <0.2 mg/dL (ref 0.0–1.2)
CO2: 24 mmol/L (ref 20–29)
Calcium: 10.4 mg/dL — ABNORMAL HIGH (ref 8.7–10.3)
Chloride: 101 mmol/L (ref 96–106)
Creatinine, Ser: 0.96 mg/dL (ref 0.57–1.00)
Globulin, Total: 2.2 g/dL (ref 1.5–4.5)
Glucose: 158 mg/dL — ABNORMAL HIGH (ref 70–99)
Potassium: 3.9 mmol/L (ref 3.5–5.2)
Sodium: 142 mmol/L (ref 134–144)
Total Protein: 6.4 g/dL (ref 6.0–8.5)
eGFR: 61 mL/min/{1.73_m2} (ref >59)

## 2022-01-08 LAB — SPECIMEN STATUS REPORT

## 2022-01-08 LAB — MAGNESIUM: Magnesium: 1.3 mg/dL — ABNORMAL LOW (ref 1.6–2.3)

## 2022-01-08 NOTE — Telephone Encounter (Signed)
Unable to reach patient, encounter closed °

## 2022-01-22 DIAGNOSIS — H35363 Drusen (degenerative) of macula, bilateral: Secondary | ICD-10-CM | POA: Diagnosis not present

## 2022-01-22 DIAGNOSIS — H16143 Punctate keratitis, bilateral: Secondary | ICD-10-CM | POA: Diagnosis not present

## 2022-01-22 DIAGNOSIS — H5203 Hypermetropia, bilateral: Secondary | ICD-10-CM | POA: Diagnosis not present

## 2022-01-22 DIAGNOSIS — H02105 Unspecified ectropion of left lower eyelid: Secondary | ICD-10-CM | POA: Diagnosis not present

## 2022-01-22 DIAGNOSIS — H02102 Unspecified ectropion of right lower eyelid: Secondary | ICD-10-CM | POA: Diagnosis not present

## 2022-01-22 DIAGNOSIS — H40023 Open angle with borderline findings, high risk, bilateral: Secondary | ICD-10-CM | POA: Diagnosis not present

## 2022-01-22 DIAGNOSIS — H52223 Regular astigmatism, bilateral: Secondary | ICD-10-CM | POA: Diagnosis not present

## 2022-01-22 DIAGNOSIS — H25813 Combined forms of age-related cataract, bilateral: Secondary | ICD-10-CM | POA: Diagnosis not present

## 2022-01-22 DIAGNOSIS — H524 Presbyopia: Secondary | ICD-10-CM | POA: Diagnosis not present

## 2022-02-08 DIAGNOSIS — E119 Type 2 diabetes mellitus without complications: Secondary | ICD-10-CM | POA: Diagnosis not present

## 2022-02-11 DIAGNOSIS — E119 Type 2 diabetes mellitus without complications: Secondary | ICD-10-CM | POA: Diagnosis not present

## 2022-02-12 DIAGNOSIS — E119 Type 2 diabetes mellitus without complications: Secondary | ICD-10-CM | POA: Diagnosis not present

## 2022-02-13 DIAGNOSIS — E119 Type 2 diabetes mellitus without complications: Secondary | ICD-10-CM | POA: Diagnosis not present

## 2022-02-14 DIAGNOSIS — E119 Type 2 diabetes mellitus without complications: Secondary | ICD-10-CM | POA: Diagnosis not present

## 2022-02-15 DIAGNOSIS — E119 Type 2 diabetes mellitus without complications: Secondary | ICD-10-CM | POA: Diagnosis not present

## 2022-02-18 DIAGNOSIS — E119 Type 2 diabetes mellitus without complications: Secondary | ICD-10-CM | POA: Diagnosis not present

## 2022-02-19 DIAGNOSIS — E119 Type 2 diabetes mellitus without complications: Secondary | ICD-10-CM | POA: Diagnosis not present

## 2022-02-20 DIAGNOSIS — E119 Type 2 diabetes mellitus without complications: Secondary | ICD-10-CM | POA: Diagnosis not present

## 2022-02-21 DIAGNOSIS — E119 Type 2 diabetes mellitus without complications: Secondary | ICD-10-CM | POA: Diagnosis not present

## 2022-02-22 DIAGNOSIS — E119 Type 2 diabetes mellitus without complications: Secondary | ICD-10-CM | POA: Diagnosis not present

## 2022-02-25 DIAGNOSIS — E119 Type 2 diabetes mellitus without complications: Secondary | ICD-10-CM | POA: Diagnosis not present

## 2022-02-26 DIAGNOSIS — E119 Type 2 diabetes mellitus without complications: Secondary | ICD-10-CM | POA: Diagnosis not present

## 2022-02-27 DIAGNOSIS — E119 Type 2 diabetes mellitus without complications: Secondary | ICD-10-CM | POA: Diagnosis not present

## 2022-02-28 DIAGNOSIS — E119 Type 2 diabetes mellitus without complications: Secondary | ICD-10-CM | POA: Diagnosis not present

## 2022-03-01 DIAGNOSIS — E119 Type 2 diabetes mellitus without complications: Secondary | ICD-10-CM | POA: Diagnosis not present

## 2022-03-04 DIAGNOSIS — E119 Type 2 diabetes mellitus without complications: Secondary | ICD-10-CM | POA: Diagnosis not present

## 2022-03-05 DIAGNOSIS — E119 Type 2 diabetes mellitus without complications: Secondary | ICD-10-CM | POA: Diagnosis not present

## 2022-03-06 DIAGNOSIS — E119 Type 2 diabetes mellitus without complications: Secondary | ICD-10-CM | POA: Diagnosis not present

## 2022-03-07 DIAGNOSIS — E119 Type 2 diabetes mellitus without complications: Secondary | ICD-10-CM | POA: Diagnosis not present

## 2022-03-08 DIAGNOSIS — E119 Type 2 diabetes mellitus without complications: Secondary | ICD-10-CM | POA: Diagnosis not present

## 2022-03-11 DIAGNOSIS — E119 Type 2 diabetes mellitus without complications: Secondary | ICD-10-CM | POA: Diagnosis not present

## 2022-03-12 DIAGNOSIS — E119 Type 2 diabetes mellitus without complications: Secondary | ICD-10-CM | POA: Diagnosis not present

## 2022-03-13 DIAGNOSIS — E119 Type 2 diabetes mellitus without complications: Secondary | ICD-10-CM | POA: Diagnosis not present

## 2022-03-14 DIAGNOSIS — E119 Type 2 diabetes mellitus without complications: Secondary | ICD-10-CM | POA: Diagnosis not present

## 2022-03-15 DIAGNOSIS — E119 Type 2 diabetes mellitus without complications: Secondary | ICD-10-CM | POA: Diagnosis not present

## 2022-03-18 DIAGNOSIS — E119 Type 2 diabetes mellitus without complications: Secondary | ICD-10-CM | POA: Diagnosis not present

## 2022-03-19 DIAGNOSIS — E119 Type 2 diabetes mellitus without complications: Secondary | ICD-10-CM | POA: Diagnosis not present

## 2022-03-20 DIAGNOSIS — E119 Type 2 diabetes mellitus without complications: Secondary | ICD-10-CM | POA: Diagnosis not present

## 2022-03-21 DIAGNOSIS — E119 Type 2 diabetes mellitus without complications: Secondary | ICD-10-CM | POA: Diagnosis not present

## 2022-03-25 DIAGNOSIS — E119 Type 2 diabetes mellitus without complications: Secondary | ICD-10-CM | POA: Diagnosis not present

## 2022-03-26 DIAGNOSIS — E119 Type 2 diabetes mellitus without complications: Secondary | ICD-10-CM | POA: Diagnosis not present

## 2022-03-27 DIAGNOSIS — E119 Type 2 diabetes mellitus without complications: Secondary | ICD-10-CM | POA: Diagnosis not present

## 2022-03-28 ENCOUNTER — Ambulatory Visit (INDEPENDENT_AMBULATORY_CARE_PROVIDER_SITE_OTHER): Payer: Medicare HMO | Admitting: Family Medicine

## 2022-03-28 ENCOUNTER — Encounter: Payer: Self-pay | Admitting: Family Medicine

## 2022-03-28 VITALS — BP 124/78 | HR 96 | Temp 96.8°F | Ht 64.0 in | Wt 155.8 lb

## 2022-03-28 DIAGNOSIS — M159 Polyosteoarthritis, unspecified: Secondary | ICD-10-CM

## 2022-03-28 DIAGNOSIS — Z23 Encounter for immunization: Secondary | ICD-10-CM

## 2022-03-28 DIAGNOSIS — Z79899 Other long term (current) drug therapy: Secondary | ICD-10-CM

## 2022-03-28 DIAGNOSIS — E119 Type 2 diabetes mellitus without complications: Secondary | ICD-10-CM | POA: Diagnosis not present

## 2022-03-28 MED ORDER — TRAMADOL HCL 50 MG PO TABS: 100.0000 mg | ORAL_TABLET | Freq: Two times a day (BID) | ORAL | 0 refills | Status: AC | PRN

## 2022-03-28 MED ORDER — MELOXICAM 7.5 MG PO TABS: 7.5000 mg | ORAL_TABLET | Freq: Every day | ORAL | 2 refills | Status: DC

## 2022-03-28 MED ORDER — TRAMADOL HCL 50 MG PO TABS: 50.0000 mg | ORAL_TABLET | Freq: Two times a day (BID) | ORAL | 0 refills | Status: AC | PRN

## 2022-03-28 MED ORDER — TRAMADOL HCL 50 MG PO TABS: 100.0000 mg | ORAL_TABLET | Freq: Two times a day (BID) | ORAL | 0 refills | Status: DC

## 2022-03-28 MED ORDER — NALOXONE HCL 4 MG/0.1ML NA LIQD: NASAL | 6 refills | Status: DC

## 2022-03-28 NOTE — Progress Notes (Addendum)
Subjective:  Patient ID: Denise Underwood, female    DOB: May 25, 1945, 76 y.o.   MRN: [id number]  Patient Care Team: Baruch Gouty, FNP as PCP - General (Family Medicine) Magrinat, Virgie Dad, MD (Inactive) as Consulting Physician (Oncology) Neale Burly, MD (Internal Medicine) Jacquiline Doe Marko Stai, MD as Consulting Physician (Internal Medicine)   Chief Complaint:  Establish Care Denise Underwood patient ) and Pain Management   HPI: Denise Underwood is a 76 y.o. female presenting on 03/28/2022 for Establish Care Denise Underwood patient ) and Pain Management   1. Primary osteoarthritis involving multiple joints 2. Controlled substance agreement signed Pain assessment: Cause of pain- arthritis Pain location- multiple sites Pain on scale of 1-10: 8-9 without medications, 5-6 with medications Frequency- daily What increases pain-any activity What makes pain Better-medications and rest Effects on ADL - significant Any change in general medical condition-none  Current opioids rx- Tramadol 50 mg (100 mg every 12 hours0 # meds rx- 120 Effectiveness of current meds-good Adverse reactions from pain meds-none Morphine equivalent- 20 MME/Day  Pill count performed-No, did not bring Last drug screen - 07/13/2021 Urine drug screen today- Yes Was the [facility] reviewed- yes  If yes were their any concerning findings? - no   Overdose risk: low    06/22/2020   12:21 PM  Opioid Risk   Alcohol 0  Illegal Drugs 0  Rx Drugs 0  Alcohol 0  Illegal Drugs 0  Rx Drugs 0  Age between 16-45 years  0  History of Preadolescent Sexual Abuse 0  Psychological Disease 0  Depression 0  Opioid Risk Tool Scoring 0  Opioid Risk Interpretation Low Risk     Pain contract signed on:      Relevant past medical, surgical, family, and social history reviewed and updated as indicated.  Allergies and medications reviewed and updated. Data reviewed: Chart in Epic.   Past Medical History:  Diagnosis Date   Anxiety     Arthritis    Hyperlipidemia    Hypertension    Osteopenia 07/04/2021   Prediabetes 07/09/2021   Psoriasis     Past Surgical History:  Procedure Laterality Date   ABDOMINAL HYSTERECTOMY     CHOLECYSTECTOMY     HERNIA REPAIR     KNEE SURGERY     TUBAL LIGATION      Social History   Socioeconomic History   Marital status: Single    Spouse name: Not on file   Number of children: 2   Years of education: Not on file   Highest education level: 9th grade  Occupational History   Occupation: Retired  Tobacco Use   Smoking status: Every Day    Packs/day: 0.50    Years: 40.00    Total pack years: 20.00    Types: Cigarettes   Smokeless tobacco: Never   Tobacco comments:    Smoking since she was 41 or younger  Vaping Use   Vaping Use: Never used  Substance and Sexual Activity   Alcohol use: No   Drug use: No   Sexual activity: Not Currently    Birth control/protection: Surgical  Other Topics Concern   Not on file  Social History Narrative   She lives alone on ground level apartment - has children.    Daughter, Denise Underwood is her main caregiver and means for transportation.   She also has an aide that comes in for 2 hours on weekdays to help with bathing, meals, meds etc   Social Determinants  of Health   Financial Resource Strain: Low Risk  (11/05/2021)   Overall Financial Resource Strain (CARDIA)    Difficulty of Paying Living Expenses: Not hard at all  Food Insecurity: No Food Insecurity (11/05/2021)   Hunger Vital Sign    Worried About Running Out of Food in the Last Year: Never true    Ran Out of Food in the Last Year: Never true  Transportation Needs: No Transportation Needs (11/05/2021)   PRAPARE - Hydrologist (Medical): No    Lack of Transportation (Non-Medical): No  Physical Activity: Insufficiently Active (11/05/2021)   Exercise Vital Sign    Days of Exercise per Week: 7 days    Minutes of Exercise per Session: 10 min  Stress: No Stress  Concern Present (11/05/2021)   [place]    Feeling of Stress : Only a little  Social Connections: Socially Isolated (11/05/2021)   Social Connection and Isolation Panel [NHANES]    Frequency of Communication with Friends and Family: More than three times a week    Frequency of Social Gatherings with Friends and Family: Twice a week    Attends Religious Services: Never    Marine scientist or Organizations: No    Attends Archivist Meetings: Never    Marital Status: Divorced  Human resources officer Violence: Not At Risk (11/05/2021)   Humiliation, Afraid, Rape, and Kick questionnaire    Fear of Current or Ex-Partner: No    Emotionally Abused: No    Physically Abused: No    Sexually Abused: No    Outpatient Encounter Medications as of 03/28/2022  Medication Sig   albuterol (VENTOLIN HFA) 108 (90 Base) MCG/ACT inhaler Inhale 2 puffs into the lungs every 6 (six) hours as needed.   CALCIUM PO Take by mouth daily.   Cholecalciferol (VITAMIN D3) 1000 units CAPS Take by mouth.   diclofenac Sodium (VOLTAREN) 1 % GEL Apply 2 g topically 4 (four) times daily.   escitalopram (LEXAPRO) 20 MG tablet Take 1 tablet (20 mg total) by mouth daily.   hydrochlorothiazide (HYDRODIURIL) 25 MG tablet Take 1 tablet (25 mg total) by mouth daily as needed.   hydrocortisone 2.5 % cream Apply topically 2 (two) times daily.   nystatin (MYCOSTATIN/NYSTOP) powder Apply 1 application. topically 3 (three) times daily.   rosuvastatin (CRESTOR) 20 MG tablet Take 1 tablet (20 mg total) by mouth daily.   traMADol (ULTRAM) 50 MG tablet Take 2 tablets (100 mg total) by mouth every 12 (twelve) hours as needed.   traMADol (ULTRAM) 50 MG tablet Take 1 tablet (50 mg total) by mouth every 12 (twelve) hours as needed for up to 5 days for severe pain.   traZODone (DESYREL) 50 MG tablet Take 1 tablet (50 mg total) by mouth at bedtime.   triamcinolone  (KENALOG) 0.025 % ointment APPLY 1 APPLICATION TOPICALLY 2 TIMES A DAY.   [DISCONTINUED] meloxicam (MOBIC) 7.5 MG tablet Take 1 tablet (7.5 mg total) by mouth daily.   [DISCONTINUED] traMADol (ULTRAM) 50 MG tablet Take 2 tablets (100 mg total) by mouth every 12 (twelve) hours.   meloxicam (MOBIC) 7.5 MG tablet Take 1 tablet (7.5 mg total) by mouth daily.   traMADol (ULTRAM) 50 MG tablet Take 2 tablets (100 mg total) by mouth every 12 (twelve) hours.   No facility-administered encounter medications on file as of 03/28/2022.    No Known Allergies  Review of Systems  Constitutional:  Negative for appetite change, chills, diaphoresis, fatigue, fever and unexpected weight change.  Eyes:  Negative for photophobia and visual disturbance.  Respiratory:  Negative for cough and shortness of breath.   Cardiovascular:  Negative for chest pain, palpitations and leg swelling.  Gastrointestinal:  Negative for abdominal pain, constipation, diarrhea, nausea and vomiting.  Genitourinary:  Negative for decreased urine volume and difficulty urinating.  Musculoskeletal:  Positive for arthralgias, back pain, gait problem, joint swelling and myalgias. Negative for neck pain and neck stiffness.  Neurological:  Negative for dizziness, tremors, seizures, syncope, facial asymmetry, speech difficulty, weakness, light-headedness, numbness and headaches.  Psychiatric/Behavioral:  Negative for confusion.   All other systems reviewed and are negative.       Objective:  BP 124/78   Pulse 96   Temp (!) 96.8 F (36 C) (Temporal)   Ht _0  (1.626 m)   Wt 155 lb 12.8 oz (70.7 kg)   SpO2 96%   BMI 26.74 kg/m    Wt Readings from Last 3 Encounters:  03/28/22 155 lb 12.8 oz (70.7 kg)  01/03/22 160 lb 6.4 oz (72.8 kg)  11/05/21 160 lb (72.6 kg)    Physical Exam Vitals and nursing note reviewed.  Constitutional:      General: She is not in acute distress.    Appearance: She is obese. She is not ill-appearing,  toxic-appearing or diaphoretic.     Comments: Frail elderly  HENT:     Mouth/Throat:     Mouth: Mucous membranes are moist.     Pharynx: Oropharynx is clear.  Eyes:     Pupils: Pupils are equal, round, and reactive to light.  Cardiovascular:     Rate and Rhythm: Normal rate and regular rhythm.     Heart sounds: Normal heart sounds.  Pulmonary:     Effort: Pulmonary effort is normal.     Breath sounds: Normal breath sounds.  Musculoskeletal:     Right upper leg: Normal.     Left upper leg: Normal.     Right knee: Decreased range of motion. Tenderness present.     Left knee: Decreased range of motion. Tenderness present.     Right lower leg: Normal. No edema.     Left lower leg: Normal. No edema.  Skin:    General: Skin is warm and dry.     Capillary Refill: Capillary refill takes less than 2 seconds.  Neurological:     General: No focal deficit present.     Mental Status: She is alert and oriented to person, place, and time.     Gait: Gait abnormal (using rolling walker).     Results for orders placed or performed in visit on 01/03/22  Lipid panel  Result Value Ref Range   Cholesterol, Total 165 100 - 199 mg/dL   Triglycerides 198 (H) 0 - 149 mg/dL   HDL 61 >39 mg/dL   VLDL Cholesterol Cal 33 5 - 40 mg/dL   LDL Chol Calc (NIH) 71 0 - 99 mg/dL   Chol/HDL Ratio 2.7 0.0 - 4.4 ratio  CBC with Differential/Platelet  Result Value Ref Range   WBC 15.9 (H) 3.4 - 10.8 x10E3/uL   RBC 3.98 3.77 - 5.28 x10E6/uL   Hemoglobin 13.1 11.1 - 15.9 g/dL   Hematocrit 40.4 34.0 - 46.6 %   MCV 102 (H) 79 - 97 fL   MCH 32.9 26.6 - 33.0 pg   MCHC 32.4 31.5 - 35.7 g/dL   RDW 11.6 (L) 11.7 -  15.4 %   Platelets 251 150 - 450 x10E3/uL   Neutrophils 56 Not Estab. %   Lymphs 33 Not Estab. %   Monocytes 6 Not Estab. %   Eos 4 Not Estab. %   Basos 1 Not Estab. %   Neutrophils Absolute 8.9 (H) 1.4 - 7.0 x10E3/uL   Lymphocytes Absolute 5.2 (H) 0.7 - 3.1 x10E3/uL   Monocytes Absolute 0.9 0.1 -  0.9 x10E3/uL   EOS (ABSOLUTE) 0.7 (H) 0.0 - 0.4 x10E3/uL   Basophils Absolute 0.1 0.0 - 0.2 x10E3/uL   Immature Granulocytes 0 Not Estab. %   Immature Grans (Abs) 0.0 0.0 - 0.1 x10E3/uL  Bayer DCA Hb A1c Waived  Result Value Ref Range   HB A1C (BAYER DCA - WAIVED) 5.3 4.8 - 5.6 %  Comprehensive metabolic panel  Result Value Ref Range   Glucose 158 (H) 70 - 99 mg/dL   BUN 21 8 - 27 mg/dL   Creatinine, Ser 0.96 0.57 - 1.00 mg/dL   eGFR 61 >59 mL/min/1.73   BUN/Creatinine Ratio 22 12 - 28   Sodium 142 134 - 144 mmol/L   Potassium 3.9 3.5 - 5.2 mmol/L   Chloride 101 96 - 106 mmol/L   CO2 24 20 - 29 mmol/L   Calcium 10.4 (H) 8.7 - 10.3 mg/dL   Total Protein 6.4 6.0 - 8.5 g/dL   Albumin 4.2 3.8 - 4.8 g/dL   Globulin, Total 2.2 1.5 - 4.5 g/dL   Albumin/Globulin Ratio 1.9 1.2 - 2.2   Bilirubin Total <0.2 0.0 - 1.2 mg/dL   Alkaline Phosphatase 75 44 - 121 IU/L   AST 19 0 - 40 IU/L   ALT 18 0 - 32 IU/L  Magnesium  Result Value Ref Range   Magnesium 1.3 (L) 1.6 - 2.3 mg/dL  Specimen status report  Result Value Ref Range   specimen status report Comment        Pertinent labs & imaging results that were available during my care of the patient were reviewed by me and considered in my medical decision making.  Assessment & Plan:  Nareh was seen today for establish care and pain management.  Diagnoses and all orders for this visit:  Primary osteoarthritis involving multiple joints Controlled substance agreement signed CSA and drug screen updated today. Has failed drug screen in the past, aware she will be taking a drug screen monthly for the next 3 months and then every 3 months for a year. Any abnormalities, CSA will be violated and controlled substances will be discontinued.  -     traMADol (ULTRAM) 50 MG tablet; Take 2 tablets (100 mg total) by mouth every 12 (twelve) hours. -     meloxicam (MOBIC) 7.5 MG tablet; Take 1 tablet (7.5 mg total) by mouth daily. -     traMADol  (ULTRAM) 50 MG tablet; Take 2 tablets (100 mg total) by mouth every 12 (twelve) hours as needed. -     traMADol (ULTRAM) 50 MG tablet; Take 1 tablet (50 mg total) by mouth every 12 (twelve) hours as needed for up to 5 days for severe pain. -     Drug Screen 10 W/Conf, Se   Influenza vaccine given today  Continue all other maintenance medications.  Follow up plan: Return in about 1 month (around 04/27/2022), or if symptoms worsen or fail to improve, for DM, labs.   Continue healthy lifestyle choices, including diet (rich in fruits, vegetables, and lean proteins, and low in salt and  simple carbohydrates) and exercise (at least 30 minutes of moderate physical activity daily).  Educational handout given for chronic pain  The above assessment and management plan was discussed with the patient. The patient verbalized understanding of and has agreed to the management plan. Patient is aware to call the clinic if they develop any new symptoms or if symptoms persist or worsen. Patient is aware when to return to the clinic for a follow-up visit. Patient educated on when it is appropriate to go to the emergency department.   Monia Pouch, FNP-C [facility] [phone]

## 2022-03-28 NOTE — Addendum Note (Signed)
Addended by: Sonny Masters on: 03/28/2022 10:50 AM   Modules accepted: Level of Service

## 2022-03-29 DIAGNOSIS — E119 Type 2 diabetes mellitus without complications: Secondary | ICD-10-CM | POA: Diagnosis not present

## 2022-04-01 DIAGNOSIS — E119 Type 2 diabetes mellitus without complications: Secondary | ICD-10-CM | POA: Diagnosis not present

## 2022-04-02 DIAGNOSIS — E119 Type 2 diabetes mellitus without complications: Secondary | ICD-10-CM | POA: Diagnosis not present

## 2022-04-03 DIAGNOSIS — E119 Type 2 diabetes mellitus without complications: Secondary | ICD-10-CM | POA: Diagnosis not present

## 2022-04-03 LAB — DRUG SCREEN 10 W/CONF, SERUM
Amphetamines, IA: NEGATIVE ng/mL
Barbiturates, IA: NEGATIVE ug/mL
Benzodiazepines, IA: NEGATIVE ng/mL
Cocaine & Metabolite, IA: NEGATIVE ng/mL
Methadone, IA: NEGATIVE ng/mL
Opiates, IA: NEGATIVE ng/mL
Oxycodones, IA: NEGATIVE ng/mL
Phencyclidine, IA: NEGATIVE ng/mL
Propoxyphene, IA: NEGATIVE ng/mL
THC(Marijuana) Metabolite, IA: NEGATIVE ng/mL

## 2022-04-05 DIAGNOSIS — E119 Type 2 diabetes mellitus without complications: Secondary | ICD-10-CM | POA: Diagnosis not present

## 2022-04-08 DIAGNOSIS — E119 Type 2 diabetes mellitus without complications: Secondary | ICD-10-CM | POA: Diagnosis not present

## 2022-04-09 DIAGNOSIS — E119 Type 2 diabetes mellitus without complications: Secondary | ICD-10-CM | POA: Diagnosis not present

## 2022-04-10 DIAGNOSIS — E119 Type 2 diabetes mellitus without complications: Secondary | ICD-10-CM | POA: Diagnosis not present

## 2022-04-11 DIAGNOSIS — E119 Type 2 diabetes mellitus without complications: Secondary | ICD-10-CM | POA: Diagnosis not present

## 2022-04-12 DIAGNOSIS — E119 Type 2 diabetes mellitus without complications: Secondary | ICD-10-CM | POA: Diagnosis not present

## 2022-04-15 DIAGNOSIS — E119 Type 2 diabetes mellitus without complications: Secondary | ICD-10-CM | POA: Diagnosis not present

## 2022-04-16 DIAGNOSIS — E119 Type 2 diabetes mellitus without complications: Secondary | ICD-10-CM | POA: Diagnosis not present

## 2022-04-17 DIAGNOSIS — E119 Type 2 diabetes mellitus without complications: Secondary | ICD-10-CM | POA: Diagnosis not present

## 2022-04-18 DIAGNOSIS — E119 Type 2 diabetes mellitus without complications: Secondary | ICD-10-CM | POA: Diagnosis not present

## 2022-04-19 DIAGNOSIS — E119 Type 2 diabetes mellitus without complications: Secondary | ICD-10-CM | POA: Diagnosis not present

## 2022-04-22 DIAGNOSIS — E119 Type 2 diabetes mellitus without complications: Secondary | ICD-10-CM | POA: Diagnosis not present

## 2022-04-23 DIAGNOSIS — E119 Type 2 diabetes mellitus without complications: Secondary | ICD-10-CM | POA: Diagnosis not present

## 2022-04-24 DIAGNOSIS — E119 Type 2 diabetes mellitus without complications: Secondary | ICD-10-CM | POA: Diagnosis not present

## 2022-04-25 DIAGNOSIS — E119 Type 2 diabetes mellitus without complications: Secondary | ICD-10-CM | POA: Diagnosis not present

## 2022-04-26 DIAGNOSIS — E119 Type 2 diabetes mellitus without complications: Secondary | ICD-10-CM | POA: Diagnosis not present

## 2022-04-29 DIAGNOSIS — E119 Type 2 diabetes mellitus without complications: Secondary | ICD-10-CM | POA: Diagnosis not present

## 2022-04-30 ENCOUNTER — Other Ambulatory Visit: Payer: Self-pay | Admitting: Family Medicine

## 2022-04-30 ENCOUNTER — Encounter: Payer: Self-pay | Admitting: Family Medicine

## 2022-04-30 ENCOUNTER — Ambulatory Visit (INDEPENDENT_AMBULATORY_CARE_PROVIDER_SITE_OTHER): Payer: Medicare HMO | Admitting: Family Medicine

## 2022-04-30 DIAGNOSIS — M159 Polyosteoarthritis, unspecified: Secondary | ICD-10-CM | POA: Diagnosis not present

## 2022-04-30 DIAGNOSIS — E119 Type 2 diabetes mellitus without complications: Secondary | ICD-10-CM | POA: Diagnosis not present

## 2022-04-30 DIAGNOSIS — Z79899 Other long term (current) drug therapy: Secondary | ICD-10-CM

## 2022-04-30 DIAGNOSIS — E559 Vitamin D deficiency, unspecified: Secondary | ICD-10-CM

## 2022-04-30 DIAGNOSIS — D72829 Elevated white blood cell count, unspecified: Secondary | ICD-10-CM

## 2022-04-30 DIAGNOSIS — J0110 Acute frontal sinusitis, unspecified: Secondary | ICD-10-CM

## 2022-04-30 MED ORDER — FLUTICASONE PROPIONATE 50 MCG/ACT NA SUSP: 2.0000 | Freq: Every day | NASAL | 6 refills | Status: DC

## 2022-04-30 NOTE — Progress Notes (Signed)
Virtual Visit via telephone Note Due to COVID-19 pandemic this visit was conducted virtually. This visit type was conducted due to national recommendations for restrictions regarding the COVID-19 Pandemic (e.g. social distancing, sheltering in place) in an effort to limit this patient's exposure and mitigate transmission in our community. All issues noted in this document were discussed and addressed.  A physical exam was not performed with this format.   I connected with Denise Underwood on 04/30/2022 at 0855 by telephone and verified that I am speaking with the correct person using two identifiers. Denise Underwood is currently located at home and patient is currently with them during visit. The provider, Monia Pouch, FNP is located in their office at time of visit.  I discussed the limitations, risks, security and privacy concerns of performing an evaluation and management service by virtual visit and the availability of in person appointments. I also discussed with the patient that there may be a patient responsible charge related to this service. The patient expressed understanding and agreed to proceed.  Subjective:  Patient ID: Denise Underwood, female    DOB: Jun 02, 1945, 76 y.o.   MRN: [id number]  Chief Complaint:  Pain Management   HPI: Denise Underwood is a 76 y.o. female presenting on 04/30/2022 for Pain Management   1. Primary osteoarthritis involving multiple joints 2. Controlled substance agreement signed Pt has failed a CSA in the past and is to have 1 month follow up drug screen completed today. She states she has been taking her pain medications as prescribed without associated side effects. Denies taking more than prescribed. Denies other substance use.   3. Acute non-recurrent frontal sinusitis Frontal sinus pressure. Has tried saline nasal spray with some resolution of symptoms but not complete relief. Denies fever, chills, weakness, or confusion.   4. Vitamin D  deficiency Does have chronic arthralgias. Denies recent fractures. Does have trouble walking due to arthritic pain.     Relevant past medical, surgical, family, and social history reviewed and updated as indicated.  Allergies and medications reviewed and updated.   Past Medical History:  Diagnosis Date   Anxiety    Arthritis    Hyperlipidemia    Hypertension    Osteopenia 07/04/2021   Prediabetes 07/09/2021   Psoriasis     Past Surgical History:  Procedure Laterality Date   ABDOMINAL HYSTERECTOMY     CHOLECYSTECTOMY     HERNIA REPAIR     KNEE SURGERY     TUBAL LIGATION      Social History   Socioeconomic History   Marital status: Single    Spouse name: Not on file   Number of children: 2   Years of education: Not on file   Highest education level: 9th grade  Occupational History   Occupation: Retired  Tobacco Use   Smoking status: Every Day    Packs/day: 0.50    Years: 40.00    Total pack years: 20.00    Types: Cigarettes   Smokeless tobacco: Never   Tobacco comments:    Smoking since she was 3 or younger  Vaping Use   Vaping Use: Never used  Substance and Sexual Activity   Alcohol use: No   Drug use: No   Sexual activity: Not Currently    Birth control/protection: Surgical  Other Topics Concern   Not on file  Social History Narrative   She lives alone on ground level apartment - has children.    Daughter, [name] is her main  caregiver and means for transportation.   She also has an aide that comes in for 2 hours on weekdays to help with bathing, meals, meds etc   Social Determinants of Health   Financial Resource Strain: Low Risk  (11/05/2021)   Overall Financial Resource Strain (CARDIA)    Difficulty of Paying Living Expenses: Not hard at all  Food Insecurity: No Food Insecurity (11/05/2021)   Hunger Vital Sign    Worried About Running Out of Food in the Last Year: Never true    [place] in the Last Year: Never true  Transportation Needs:  No Transportation Needs (11/05/2021)   PRAPARE - Hydrologist (Medical): No    Lack of Transportation (Non-Medical): No  Physical Activity: Insufficiently Active (11/05/2021)   Exercise Vital Sign    Days of Exercise per Week: 7 days    Minutes of Exercise per Session: 10 min  Stress: No Stress Concern Present (11/05/2021)   [place]    Feeling of Stress : Only a little  Social Connections: Socially Isolated (11/05/2021)   Social Connection and Isolation Panel [NHANES]    Frequency of Communication with Friends and Family: More than three times a week    Frequency of Social Gatherings with Friends and Family: Twice a week    Attends Religious Services: Never    Marine scientist or Organizations: No    Attends Archivist Meetings: Never    Marital Status: Divorced  Human resources officer Violence: Not At Risk (11/05/2021)   Humiliation, Afraid, Rape, and Kick questionnaire    Fear of Current or Ex-Partner: No    Emotionally Abused: No    Physically Abused: No    Sexually Abused: No    Outpatient Encounter Medications as of 04/30/2022  Medication Sig   albuterol (VENTOLIN HFA) 108 (90 Base) MCG/ACT inhaler Inhale 2 puffs into the lungs every 6 (six) hours as needed.   CALCIUM PO Take by mouth daily.   Cholecalciferol (VITAMIN D3) 1000 units CAPS Take by mouth.   diclofenac Sodium (VOLTAREN) 1 % GEL Apply 2 g topically 4 (four) times daily.   escitalopram (LEXAPRO) 20 MG tablet Take 1 tablet (20 mg total) by mouth daily.   hydrochlorothiazide (HYDRODIURIL) 25 MG tablet Take 1 tablet (25 mg total) by mouth daily as needed.   hydrocortisone 2.5 % cream Apply topically 2 (two) times daily.   meloxicam (MOBIC) 7.5 MG tablet Take 1 tablet (7.5 mg total) by mouth daily.   naloxone (NARCAN) nasal spray 4 mg/0.1 mL As needed or respiratory depression or decreased mental status related to  narcotic use / overdose   nystatin (MYCOSTATIN/NYSTOP) powder Apply 1 application. topically 3 (three) times daily.   rosuvastatin (CRESTOR) 20 MG tablet Take 1 tablet (20 mg total) by mouth daily.   traMADol (ULTRAM) 50 MG tablet Take 2 tablets (100 mg total) by mouth every 12 (twelve) hours.   traZODone (DESYREL) 50 MG tablet Take 1 tablet (50 mg total) by mouth at bedtime.   triamcinolone (KENALOG) 0.025 % ointment APPLY 1 APPLICATION TOPICALLY 2 TIMES A DAY.   No facility-administered encounter medications on file as of 04/30/2022.    No Known Allergies  Review of Systems  Constitutional:  Positive for activity change. Negative for appetite change, chills, diaphoresis, fatigue, fever and unexpected weight change.  HENT:  Positive for congestion, postnasal drip, rhinorrhea and sinus pressure. Negative for dental problem,  drooling, ear discharge, ear pain, facial swelling, hearing loss, mouth sores, nosebleeds, sinus pain, sneezing, sore throat, tinnitus, trouble swallowing and voice change.   Eyes: Negative.  Negative for visual disturbance.  Respiratory:  Negative for cough, chest tightness and shortness of breath.   Cardiovascular:  Negative for chest pain, palpitations and leg swelling.  Gastrointestinal:  Negative for abdominal pain, blood in stool, constipation, diarrhea, nausea and vomiting.  Endocrine: Negative.   Genitourinary:  Negative for decreased urine volume, difficulty urinating, dysuria, frequency and urgency.  Musculoskeletal:  Positive for arthralgias, back pain, gait problem, joint swelling and myalgias.  Skin: Negative.   Allergic/Immunologic: Negative.   Neurological:  Positive for headaches. Negative for dizziness, tremors, seizures, syncope, facial asymmetry, speech difficulty, weakness, light-headedness and numbness.  Hematological: Negative.   Psychiatric/Behavioral:  Negative for confusion, hallucinations, sleep disturbance and suicidal ideas.   All other  systems reviewed and are negative.        Observations/Objective: No vital signs or physical exam, this was a virtual health encounter.  Pt alert and oriented, answers all questions appropriately, and able to speak in full sentences.    Assessment and Plan: Camylle was seen today for pain management.  Diagnoses and all orders for this visit:  Primary osteoarthritis involving multiple joints Will repeat drug screen today along with labs. Continue current regimen. If drug screen is failed, will discontinue medications and void controlled substance agreement.  -     VITAMIN D 25 Hydroxy (Vit-D Deficiency, Fractures) -     BMP8+EGFR -     CBC with Differential/Platelet -     ToxASSURE Select 13 (MW), Urine  Acute non-recurrent frontal sinusitis Flonase sent to pharmacy. Pt aware of symptomatic care at home. Report new, worsening, or persistent symptoms.   Vitamin D deficiency Labs pending. Continue repletion therapy. If indicated, will change repletion dosage. Eat foods rich in Vit D including milk, orange juice, yogurt with vitamin D added, salmon or mackerel, canned tuna fish, cereals with vitamin D added, and cod liver oil. Get out in the sun but make sure to wear at least SPF 30 sunscreen. -     VITAMIN D 25 Hydroxy (Vit-D Deficiency, Fractures) -     BMP8+EGFR -     CBC with Differential/Platelet     Follow Up Instructions: Return in about 1 month (around 05/31/2022) for chronic pain.    I discussed the assessment and treatment plan with the patient. The patient was provided an opportunity to ask questions and all were answered. The patient agreed with the plan and demonstrated an understanding of the instructions.   The patient was advised to call back or seek an in-person evaluation if the symptoms worsen or if the condition fails to improve as anticipated.  The above assessment and management plan was discussed with the patient. The patient verbalized understanding of  and has agreed to the management plan. Patient is aware to call the clinic if they develop any new symptoms or if symptoms persist or worsen. Patient is aware when to return to the clinic for a follow-up visit. Patient educated on when it is appropriate to go to the emergency department.    I provided 15 minutes of time during this telephone encounter.   Monia Pouch, FNP-C [facility] [address] [place], [place] [address] [phone] 04/30/2022

## 2022-05-01 ENCOUNTER — Other Ambulatory Visit: Payer: Medicare HMO

## 2022-05-01 DIAGNOSIS — E119 Type 2 diabetes mellitus without complications: Secondary | ICD-10-CM | POA: Diagnosis not present

## 2022-05-01 DIAGNOSIS — E559 Vitamin D deficiency, unspecified: Secondary | ICD-10-CM | POA: Diagnosis not present

## 2022-05-01 DIAGNOSIS — M159 Polyosteoarthritis, unspecified: Secondary | ICD-10-CM | POA: Diagnosis not present

## 2022-05-01 DIAGNOSIS — Z79899 Other long term (current) drug therapy: Secondary | ICD-10-CM

## 2022-05-01 NOTE — Addendum Note (Signed)
Addended by: Angela Adam on: 05/01/2022 09:37 AM   Modules accepted: Orders

## 2022-05-02 DIAGNOSIS — E119 Type 2 diabetes mellitus without complications: Secondary | ICD-10-CM | POA: Diagnosis not present

## 2022-05-02 LAB — CBC WITH DIFFERENTIAL/PLATELET
Basophils Absolute: 0.1 10*3/uL (ref 0.0–0.2)
Basos: 1 %
EOS (ABSOLUTE): 0.7 10*3/uL — ABNORMAL HIGH (ref 0.0–0.4)
Eos: 5 %
Hematocrit: 40.6 % (ref 34.0–46.6)
Hemoglobin: 13.6 g/dL (ref 11.1–15.9)
Immature Grans (Abs): 0.1 10*3/uL (ref 0.0–0.1)
Immature Granulocytes: 1 %
Lymphocytes Absolute: 4.5 10*3/uL — ABNORMAL HIGH (ref 0.7–3.1)
Lymphs: 30 %
MCH: 32.7 pg (ref 26.6–33.0)
MCHC: 33.5 g/dL (ref 31.5–35.7)
MCV: 98 fL — ABNORMAL HIGH (ref 79–97)
Monocytes Absolute: 1 10*3/uL — ABNORMAL HIGH (ref 0.1–0.9)
Monocytes: 6 %
Neutrophils Absolute: 8.6 10*3/uL — ABNORMAL HIGH (ref 1.4–7.0)
Neutrophils: 57 %
Platelets: 259 10*3/uL (ref 150–450)
RBC: 4.16 x10E6/uL (ref 3.77–5.28)
RDW: 12.2 % (ref 11.7–15.4)
WBC: 14.9 10*3/uL — ABNORMAL HIGH (ref 3.4–10.8)

## 2022-05-02 LAB — BMP8+EGFR
BUN/Creatinine Ratio: 21 (ref 12–28)
BUN: 22 mg/dL (ref 8–27)
CO2: 27 mmol/L (ref 20–29)
Calcium: 10.1 mg/dL (ref 8.7–10.3)
Chloride: 101 mmol/L (ref 96–106)
Creatinine, Ser: 1.04 mg/dL — ABNORMAL HIGH (ref 0.57–1.00)
Glucose: 108 mg/dL — ABNORMAL HIGH (ref 70–99)
Potassium: 4 mmol/L (ref 3.5–5.2)
Sodium: 142 mmol/L (ref 134–144)
eGFR: 55 mL/min/{1.73_m2} — ABNORMAL LOW (ref >59)

## 2022-05-02 LAB — VITAMIN D 25 HYDROXY (VIT D DEFICIENCY, FRACTURES): Vit D, 25-Hydroxy: 37.6 ng/mL (ref 30.0–100.0)

## 2022-05-02 NOTE — Addendum Note (Signed)
Addended by: Sonny Masters on: 05/02/2022 06:19 PM   Modules accepted: Orders

## 2022-05-03 DIAGNOSIS — E119 Type 2 diabetes mellitus without complications: Secondary | ICD-10-CM | POA: Diagnosis not present

## 2022-05-07 DIAGNOSIS — E119 Type 2 diabetes mellitus without complications: Secondary | ICD-10-CM | POA: Diagnosis not present

## 2022-05-08 DIAGNOSIS — E119 Type 2 diabetes mellitus without complications: Secondary | ICD-10-CM | POA: Diagnosis not present

## 2022-05-09 DIAGNOSIS — E119 Type 2 diabetes mellitus without complications: Secondary | ICD-10-CM | POA: Diagnosis not present

## 2022-05-10 DIAGNOSIS — E119 Type 2 diabetes mellitus without complications: Secondary | ICD-10-CM | POA: Diagnosis not present

## 2022-05-14 DIAGNOSIS — E119 Type 2 diabetes mellitus without complications: Secondary | ICD-10-CM | POA: Diagnosis not present

## 2022-05-15 DIAGNOSIS — E119 Type 2 diabetes mellitus without complications: Secondary | ICD-10-CM | POA: Diagnosis not present

## 2022-05-15 LAB — TRAMADOL,MS,WB/SP RFX
O-Desmethyltramadol: 104.1 ng/mL
Tramadol Confirmation: POSITIVE
Tramadol: 1041.9 ng/mL

## 2022-05-15 LAB — DRUG SCREEN 13 W/CONF , SERUM
Amphetamines, IA: NEGATIVE ng/mL
Barbiturates, IA: NEGATIVE ug/mL
Benzodiazepines, IA: NEGATIVE ng/mL
Cocaine & Metabolite, IA: NEGATIVE ng/mL
FENTANYL, IA: NEGATIVE ng/mL
MEPERIDINE, IA: NEGATIVE ng/mL
Methadone, IA: NEGATIVE ng/mL
Opiates, IA: NEGATIVE ng/mL
Oxycodones, IA: NEGATIVE ng/mL
Phencyclidine, IA: NEGATIVE ng/mL
Propoxyphene, IA: NEGATIVE ng/mL
THC(Marijuana) Metabolite, IA: NEGATIVE ng/mL
TRAMADOL, IA: POSITIVE ng/mL — AB

## 2022-05-16 DIAGNOSIS — E119 Type 2 diabetes mellitus without complications: Secondary | ICD-10-CM | POA: Diagnosis not present

## 2022-05-17 DIAGNOSIS — E119 Type 2 diabetes mellitus without complications: Secondary | ICD-10-CM | POA: Diagnosis not present

## 2022-05-20 DIAGNOSIS — E119 Type 2 diabetes mellitus without complications: Secondary | ICD-10-CM | POA: Diagnosis not present

## 2022-05-21 DIAGNOSIS — E119 Type 2 diabetes mellitus without complications: Secondary | ICD-10-CM | POA: Diagnosis not present

## 2022-05-22 DIAGNOSIS — E119 Type 2 diabetes mellitus without complications: Secondary | ICD-10-CM | POA: Diagnosis not present

## 2022-05-23 DIAGNOSIS — E119 Type 2 diabetes mellitus without complications: Secondary | ICD-10-CM | POA: Diagnosis not present

## 2022-05-24 DIAGNOSIS — E119 Type 2 diabetes mellitus without complications: Secondary | ICD-10-CM | POA: Diagnosis not present

## 2022-05-28 DIAGNOSIS — E119 Type 2 diabetes mellitus without complications: Secondary | ICD-10-CM | POA: Diagnosis not present

## 2022-05-29 DIAGNOSIS — E119 Type 2 diabetes mellitus without complications: Secondary | ICD-10-CM | POA: Diagnosis not present

## 2022-05-30 ENCOUNTER — Ambulatory Visit (INDEPENDENT_AMBULATORY_CARE_PROVIDER_SITE_OTHER): Payer: Medicare HMO | Admitting: Family Medicine

## 2022-05-30 ENCOUNTER — Encounter: Payer: Self-pay | Admitting: Family Medicine

## 2022-05-30 VITALS — BP 144/81 | HR 81 | Temp 97.3°F | Ht 64.0 in | Wt 162.4 lb

## 2022-05-30 DIAGNOSIS — D72828 Other elevated white blood cell count: Secondary | ICD-10-CM | POA: Diagnosis not present

## 2022-05-30 DIAGNOSIS — L409 Psoriasis, unspecified: Secondary | ICD-10-CM

## 2022-05-30 DIAGNOSIS — I10 Essential (primary) hypertension: Secondary | ICD-10-CM

## 2022-05-30 DIAGNOSIS — M25562 Pain in left knee: Secondary | ICD-10-CM | POA: Diagnosis not present

## 2022-05-30 DIAGNOSIS — Z79899 Other long term (current) drug therapy: Secondary | ICD-10-CM

## 2022-05-30 DIAGNOSIS — G8929 Other chronic pain: Secondary | ICD-10-CM | POA: Diagnosis not present

## 2022-05-30 DIAGNOSIS — G479 Sleep disorder, unspecified: Secondary | ICD-10-CM

## 2022-05-30 DIAGNOSIS — E559 Vitamin D deficiency, unspecified: Secondary | ICD-10-CM | POA: Diagnosis not present

## 2022-05-30 DIAGNOSIS — M159 Polyosteoarthritis, unspecified: Secondary | ICD-10-CM | POA: Diagnosis not present

## 2022-05-30 DIAGNOSIS — M25561 Pain in right knee: Secondary | ICD-10-CM

## 2022-05-30 DIAGNOSIS — E119 Type 2 diabetes mellitus without complications: Secondary | ICD-10-CM | POA: Diagnosis not present

## 2022-05-30 DIAGNOSIS — F331 Major depressive disorder, recurrent, moderate: Secondary | ICD-10-CM

## 2022-05-30 DIAGNOSIS — E7841 Elevated Lipoprotein(a): Secondary | ICD-10-CM

## 2022-05-30 DIAGNOSIS — R69 Illness, unspecified: Secondary | ICD-10-CM | POA: Diagnosis not present

## 2022-05-30 DIAGNOSIS — F411 Generalized anxiety disorder: Secondary | ICD-10-CM

## 2022-05-30 MED ORDER — TRAMADOL HCL 50 MG PO TABS: 100.0000 mg | ORAL_TABLET | Freq: Two times a day (BID) | ORAL | 0 refills | Status: DC

## 2022-05-30 MED ORDER — METHYLPREDNISOLONE ACETATE 40 MG/ML IJ SUSP
40.0000 mg | Freq: Once | INTRAMUSCULAR | Status: AC
  Administered 2022-05-30: 60 mg via INTRAMUSCULAR

## 2022-05-30 MED ORDER — ROSUVASTATIN CALCIUM 20 MG PO TABS: 20.0000 mg | ORAL_TABLET | Freq: Every day | ORAL | 1 refills | Status: DC

## 2022-05-30 MED ORDER — HYDROCHLOROTHIAZIDE 25 MG PO TABS: 25.0000 mg | ORAL_TABLET | Freq: Every day | ORAL | 1 refills | Status: DC | PRN

## 2022-05-30 MED ORDER — ESCITALOPRAM OXALATE 20 MG PO TABS: 20.0000 mg | ORAL_TABLET | Freq: Every day | ORAL | 1 refills | Status: DC

## 2022-05-30 MED ORDER — TRAZODONE HCL 50 MG PO TABS: 50.0000 mg | ORAL_TABLET | Freq: Every day | ORAL | 1 refills | Status: DC

## 2022-05-30 NOTE — Progress Notes (Signed)
Subjective:  Patient ID: Denise Underwood, female    DOB: [birth year], 77 y.o.   MRN: [id number]  Patient Care Team: Denise Gouty, FNP as PCP - General (Family Medicine) Underwood, Denise Dad, MD (Inactive) as Consulting Physician (Oncology) Denise Burly, MD (Internal Medicine) Denise Doe Marko Stai, MD as Consulting Physician (Internal Medicine)   Chief Complaint:  pain managment  (1 month follow up ) and Psoriasis   HPI: Denise Underwood is a 77 y.o. female presenting on 05/30/2022 for pain managment  (1 month follow up ) and Psoriasis   Psoriasis   1. Controlled substance agreement signed 2. Primary osteoarthritis involving multiple joints Pain assessment: Cause of pain- arthritis Pain location- multiple sites Pain on scale of 1-10- 8/10 most days Frequency- daily What increases pain-any activity, cold weather What makes pain Better-medications Effects on ADL - significant Any change in general medical condition-worsening psoriasis rash  Current opioids rx- tramadol 100 mg every 12 hours # meds rx- 120 Effectiveness of current meds-good Adverse reactions from pain meds-none Morphine equivalent- 20 MME/day  Pill count performed-No Last drug screen - 04/2022 Urine drug screen today- Yes Was the [place] reviewed- yes  If yes were their any concerning findings? - no   Overdose risk: low    06/22/2020   12:21 PM  Opioid Risk   Alcohol 0  Illegal Drugs 0  Rx Drugs 0  Alcohol 0  Illegal Drugs 0  Rx Drugs 0  Age between 16-45 years  0  History of Preadolescent Sexual Abuse 0  Psychological Disease 0  Depression 0  Opioid Risk Tool Scoring 0  Opioid Risk Interpretation Low Risk    3. GAD (generalized anxiety disorder) 4. Difficulty sleeping 5. Moderate episode of recurrent major depressive disorder ([place]) Doing well on current medications. No SI or HI reported.     03/28/2022   10:36 AM 01/03/2022   10:16 AM 10/02/2021    9:09 AM 07/04/2021    8:16 AM  GAD 7 :  Generalized Anxiety Score  Nervous, Anxious, on Edge 2 2 3 3   Control/stop worrying 1 2 2 2   Worry too much - different things 2 3 2 2   Trouble relaxing 2 2 3 2   Restless 1 2 1 2   Easily annoyed or irritable 1 1 3 3   Afraid - awful might happen 0 2 1 2   Total GAD 7 Score 9 14 15 16   Anxiety Difficulty Very difficult Somewhat difficult Somewhat difficult Not difficult at all       03/28/2022   10:36 AM 01/03/2022   10:16 AM 11/05/2021    1:31 PM 10/02/2021    9:08 AM 07/04/2021    8:16 AM  Depression screen PHQ 2/9  Decreased Interest 1 2 1 1 3   Down, Depressed, Hopeless 1 1 1 2 2   PHQ - 2 Score 2 3 2 3 5   Altered sleeping 2 1 1 1 1   Tired, decreased energy 2 0 1 2 2   Change in appetite 1 2 0 1 0  Feeling bad or failure about yourself  0 2 1 2 1   Trouble concentrating 0 0 1 2 1   Moving slowly or fidgety/restless 2 0 1 2 0  Suicidal thoughts 0 0 0 0 0  PHQ-9 Score 9 8 7 13 10   Difficult doing work/chores Very difficult Somewhat difficult Somewhat difficult Somewhat difficult Not difficult at all     6. Essential hypertension Denies headaches, chest pain, palpitations, visual  changes, leg swelling, weakness, confusion, or syncope. Does not exercise due to significant arthralgias. Taking medications as prescribed.   7. Elevated lipoprotein(a) On statin and tolerating well. No worsening arthralgias or myalgias.   8. Psoriasis Worsening over the last month. Unknown what has triggered a flare. Significant pruritus. Has not seen dermatology and does not wish to unless absolutely necessary.   9. Vitamin D deficiency Currently not on repletion therapy. No recent fractures.      Relevant past medical, surgical, family, and social history reviewed and updated as indicated.  Allergies and medications reviewed and updated. Data reviewed: Chart in Epic.   Past Medical History:  Diagnosis Date   Anxiety    Arthritis    Hyperlipidemia    Hypertension    Osteopenia 07/04/2021    Prediabetes 07/09/2021   Psoriasis     Past Surgical History:  Procedure Laterality Date   ABDOMINAL HYSTERECTOMY     CHOLECYSTECTOMY     HERNIA REPAIR     KNEE SURGERY     TUBAL LIGATION      Social History   Socioeconomic History   Marital status: Single    Spouse name: Not on file   Number of children: 2   Years of education: Not on file   Highest education level: 9th grade  Occupational History   Occupation: Retired  Tobacco Use   Smoking status: Every Day    Packs/day: 0.50    Years: 40.00    Total pack years: 20.00    Types: Cigarettes   Smokeless tobacco: Never   Tobacco comments:    Smoking since she was 29 or younger  Vaping Use   Vaping Use: Never used  Substance and Sexual Activity   Alcohol use: No   Drug use: No   Sexual activity: Not Currently    Birth control/protection: Surgical  Other Topics Concern   Not on file  Social History Narrative   She lives alone on ground level apartment - has children.    Denise, Angelique Underwood is her main caregiver and means for transportation.   She also has an aide that comes in for 2 hours on weekdays to help with bathing, meals, meds etc   Social Determinants of Health   Financial Resource Strain: Low Risk  (11/05/2021)   Overall Financial Resource Strain (CARDIA)    Difficulty of Paying Living Expenses: Not hard at all  Food Insecurity: No Food Insecurity (11/05/2021)   Hunger Vital Sign    Worried About Running Out of Food in the Last Year: Never true    Ran Out of Food in the Last Year: Never true  Transportation Needs: No Transportation Needs (11/05/2021)   PRAPARE - Administrator, Civil Service (Medical): No    Lack of Transportation (Non-Medical): No  Physical Activity: Insufficiently Active (11/05/2021)   Exercise Vital Sign    Days of Exercise per Week: 7 days    Minutes of Exercise per Session: 10 min  Stress: No Stress Concern Present (11/05/2021)   [employer] of Occupational Health -  Occupational Stress Questionnaire    Feeling of Stress : Only a little  Social Connections: Socially Isolated (11/05/2021)   Social Connection and Isolation Panel [NHANES]    Frequency of Communication with Friends and Family: More than three times a week    Frequency of Social Gatherings with Friends and Family: Twice a week    Attends Religious Services: Never    Database administrator or Organizations:  No    Attends Club or Organization Meetings: Never    Marital Status: Divorced  Catering manager Violence: Not At Risk (11/05/2021)   Humiliation, Afraid, Rape, and Kick questionnaire    Fear of Current or Ex-Partner: No    Emotionally Abused: No    Physically Abused: No    Sexually Abused: No    Outpatient Encounter Medications as of 05/30/2022  Medication Sig   albuterol (VENTOLIN HFA) 108 (90 Base) MCG/ACT inhaler Inhale 2 puffs into the lungs every 6 (six) hours as needed.   CALCIUM PO Take by mouth daily.   Cholecalciferol (VITAMIN D3) 1000 units CAPS Take by mouth.   diclofenac Sodium (VOLTAREN) 1 % GEL Apply 2 g topically 4 (four) times daily.   fluticasone (FLONASE) 50 MCG/ACT nasal spray Place 2 sprays into both nostrils daily.   hydrocortisone 2.5 % cream Apply topically 2 (two) times daily.   meloxicam (MOBIC) 7.5 MG tablet Take 1 tablet (7.5 mg total) by mouth daily.   naloxone (NARCAN) nasal spray 4 mg/0.1 mL As needed or respiratory depression or decreased mental status related to narcotic use / overdose   nystatin (MYCOSTATIN/NYSTOP) powder Apply 1 application. topically 3 (three) times daily.   triamcinolone (KENALOG) 0.025 % ointment APPLY 1 APPLICATION TOPICALLY 2 TIMES A DAY.   [DISCONTINUED] escitalopram (LEXAPRO) 20 MG tablet Take 1 tablet (20 mg total) by mouth daily.   [DISCONTINUED] hydrochlorothiazide (HYDRODIURIL) 25 MG tablet Take 1 tablet (25 mg total) by mouth daily as needed.   [DISCONTINUED] rosuvastatin (CRESTOR) 20 MG tablet Take 1 tablet (20 mg total)  by mouth daily.   [DISCONTINUED] traMADol (ULTRAM) 50 MG tablet Take 2 tablets (100 mg total) by mouth every 12 (twelve) hours.   [DISCONTINUED] traZODone (DESYREL) 50 MG tablet Take 1 tablet (50 mg total) by mouth at bedtime.   escitalopram (LEXAPRO) 20 MG tablet Take 1 tablet (20 mg total) by mouth daily.   hydrochlorothiazide (HYDRODIURIL) 25 MG tablet Take 1 tablet (25 mg total) by mouth daily as needed.   rosuvastatin (CRESTOR) 20 MG tablet Take 1 tablet (20 mg total) by mouth daily.   traMADol (ULTRAM) 50 MG tablet Take 2 tablets (100 mg total) by mouth every 12 (twelve) hours.   traZODone (DESYREL) 50 MG tablet Take 1 tablet (50 mg total) by mouth at bedtime.   [EXPIRED] methylPREDNISolone acetate (DEPO-MEDROL) injection 40 mg    [EXPIRED] methylPREDNISolone acetate (DEPO-MEDROL) injection 40 mg    No facility-administered encounter medications on file as of 05/30/2022.    No Known Allergies  Review of Systems  Constitutional:  Positive for activity change, appetite change and fatigue. Negative for chills, diaphoresis, fever and unexpected weight change.  HENT: Negative.    Eyes: Negative.  Negative for photophobia and visual disturbance.  Respiratory:  Negative for cough, chest tightness and shortness of breath.   Cardiovascular:  Negative for chest pain, palpitations and leg swelling.  Gastrointestinal:  Negative for abdominal pain, blood in stool, constipation, diarrhea, nausea and vomiting.  Endocrine: Negative.  Negative for polydipsia, polyphagia and polyuria.  Genitourinary:  Negative for decreased urine volume, difficulty urinating, dysuria, frequency and urgency.  Musculoskeletal:  Positive for arthralgias, back pain, gait problem, joint swelling and myalgias. Negative for neck pain and neck stiffness.  Skin:  Positive for color change and rash.  Allergic/Immunologic: Negative.   Neurological:  Negative for dizziness, tremors, seizures, syncope, facial asymmetry, speech  difficulty, weakness, light-headedness, numbness and headaches.  Hematological: Negative.   Psychiatric/Behavioral:  Positive for sleep disturbance. Negative for agitation, behavioral problems, confusion, decreased concentration, dysphoric mood, hallucinations, self-injury and suicidal ideas. The patient is nervous/anxious. The patient is not hyperactive.   All other systems reviewed and are negative.       Objective:  BP (!) 144/81   Pulse 81   Temp (!) 97.3 F (36.3 C) (Temporal)   Ht 5\' 4"  (1.626 m)   Wt 162 lb 6.4 oz (73.7 kg)   SpO2 93%   BMI 27.88 kg/m    Wt Readings from Last 3 Encounters:  05/30/22 162 lb 6.4 oz (73.7 kg)  03/28/22 155 lb 12.8 oz (70.7 kg)  01/03/22 160 lb 6.4 oz (72.8 kg)    Physical Exam Vitals and nursing note reviewed.  Constitutional:      General: She is not in acute distress.    Appearance: She is ill-appearing (frail elderly). She is not toxic-appearing or diaphoretic.  HENT:     Head: Normocephalic and atraumatic.  Eyes:     Pupils: Pupils are equal, round, and reactive to light.  Cardiovascular:     Rate and Rhythm: Normal rate and regular rhythm.     Pulses: Normal pulses.     Heart sounds: Normal heart sounds.  Pulmonary:     Effort: Pulmonary effort is normal.     Breath sounds: Normal breath sounds.  Musculoskeletal:     Cervical back: Normal.     Thoracic back: Decreased range of motion.     Lumbar back: Decreased range of motion.     Right upper leg: Normal.     Left upper leg: Normal.     Right knee: Swelling present. No deformity, erythema, ecchymosis or lacerations. Decreased range of motion. Tenderness present.     Left knee: Swelling present. No deformity, erythema, ecchymosis or lacerations. Decreased range of motion. Tenderness present.     Right lower leg: Normal.     Left lower leg: Normal.  Skin:    General: Skin is warm.     Capillary Refill: Capillary refill takes less than 2 seconds.     Findings: Erythema and  rash present. Rash is scaling.     Comments: Well-demarcated, scaly, erythematous papules and plaques to bilateral upper and lower extremities. Multiple erythematous papules with scale on the back and chest.  Neurological:     General: No focal deficit present.     Mental Status: She is alert and oriented to person, place, and time.     Gait: Gait abnormal (using rolling walker).  Psychiatric:        Mood and Affect: Mood normal.        Behavior: Behavior normal.        Thought Content: Thought content normal.        Judgment: Judgment normal.     Results for orders placed or performed in visit on 05/01/22  Drug Screen 13 with reflex Confirmation (AMP,BAR,BZO,COC,PCP,THC,OPI,OXY,MD,FEN,MEP,PPX,TRAM), Serum  Result Value Ref Range   Amphetamines, IA Negative Cutoff:50 ng/mL   Barbiturates, IA Negative Cutoff:0.1 ug/mL   Benzodiazepines, IA Negative Cutoff:20 ng/mL   Cocaine & Metabolite, IA Negative Cutoff:25 ng/mL   Phencyclidine, IA Negative Cutoff:8 ng/mL   THC(Marijuana) Metabolite, IA Negative Cutoff:5 ng/mL   Opiates, IA Negative Cutoff:5 ng/mL   Oxycodones, IA Negative Cutoff:5 ng/mL   Methadone, IA Negative Cutoff:25 ng/mL   FENTANYL, IA Negative Cutoff:1.0 ng/mL   Propoxyphene, IA Negative Cutoff:50 ng/mL   MEPERIDINE, IA Negative Cutoff:100 ng/mL   TRAMADOL, IA ++POSITIVE++ (A)  Cutoff:50 ng/mL  Tramadol,MS,WB/SP RFX  Result Value Ref Range   Tramadol Confirmation Positive    Tramadol 1,041.9 ng/mL   O-Desmethyltramadol 104.1 ng/mL     Joint Injection/Arthrocentesis  Date/Time: 05/30/2022 10:30 AM  Performed by: Denise Gouty, FNP Authorized by: Denise Gouty, FNP  Indications: joint swelling and pain  Body area: knee Joint: right knee Local anesthesia used: yes  Anesthesia: Local anesthesia used: yes Local Anesthetic: co-phenylcaine spray  Sedation: Patient sedated: no  Preparation: Patient was prepped and draped in the usual sterile fashion. Needle  size: 20 G Ultrasound guidance: no Approach: anterior Aspirate amount: 0 mL Methylprednisolone amount: 60 mg Lidocaine 2% amount: 3.5 mL Patient tolerance: patient tolerated the procedure well with no immediate complications   Joint Injection/Arthrocentesis  Date/Time: 05/30/2022 10:30 AM  Performed by: Denise Gouty, FNP Authorized by: Denise Gouty, FNP  Indications: joint swelling and pain  Body area: knee Joint: left knee Local anesthesia used: yes  Anesthesia: Local anesthesia used: yes Local Anesthetic: co-phenylcaine spray  Sedation: Patient sedated: no  Preparation: Patient was prepped and draped in the usual sterile fashion. Needle size: 20 G Ultrasound guidance: no Approach: anterior Aspirate amount: 0 mL Methylprednisolone amount: 60 mg Lidocaine 2% amount: 3.5 mL Patient tolerance: patient tolerated the procedure well with no immediate complications      Pertinent labs & imaging results that were available during my care of the patient were reviewed by me and considered in my medical decision making.  Assessment & Plan:  Anabia was seen today for pain managment  and psoriasis.  Diagnoses and all orders for this visit:  Controlled substance agreement signed Primary osteoarthritis involving multiple joints Doing well on below, will continue. Drug screen updated today.  -     ToxASSURE Select 13 (MW), Urine -     traMADol (ULTRAM) 50 MG tablet; Take 2 tablets (100 mg total) by mouth every 12 (twelve) hours. -     Drug Screen 13 with reflex Confirmation (AMP,BAR,BZO,COC,PCP,THC,OPI,OXY,MD,FEN,MEP,PPX,TRAM), Serum  Chronic pain of both knees Bilateral knees injected today. Symptomatic care discussed in detail. Aware to report new, worsening, or persistent symptoms.  -     methylPREDNISolone acetate (DEPO-MEDROL) injection 40 mg -     methylPREDNISolone acetate (DEPO-MEDROL) injection 40 mg -     Joint Injection/Arthrocentesis -     Joint  Injection/Arthrocentesis  GAD (generalized anxiety disorder) Moderate episode of recurrent major depressive disorder ([place]) Difficulty sleeping Doing well on below, will continue.  -     traZODone (DESYREL) 50 MG tablet; Take 1 tablet (50 mg total) by mouth at bedtime. -     escitalopram (LEXAPRO) 20 MG tablet; Take 1 tablet (20 mg total) by mouth daily.  Essential hypertension BP well controlled. Changes were not made in regimen today. Goal BP is 130/80. Pt aware to report any persistent high or low readings. DASH diet and exercise encouraged. Exercise at least 150 minutes per week and increase as tolerated. Goal BMI > 25. Stress management encouraged. Avoid nicotine and tobacco product use. Avoid excessive alcohol and NSAID's. Avoid more than 2000 mg of sodium daily. Medications as prescribed. Follow up as scheduled.  -     hydrochlorothiazide (HYDRODIURIL) 25 MG tablet; Take 1 tablet (25 mg total) by mouth daily as needed. -     CMP14+EGFR -     CBC with Differential/Platelet -     Thyroid Panel With TSH -     Lipid panel  Elevated lipoprotein(a) Diet  encouraged - increase intake of fresh fruits and vegetables, increase intake of lean proteins. Bake, broil, or grill foods. Avoid fried, greasy, and fatty foods. Avoid fast foods. Increase intake of fiber-rich whole grains. Exercise encouraged - at least 150 minutes per week and advance as tolerated.  Goal BMI < 25. Continue medications as prescribed. Follow up in 3-6 months as discussed.  -     rosuvastatin (CRESTOR) 20 MG tablet; Take 1 tablet (20 mg total) by mouth daily. -     Lipid panel  Psoriasis Significant psoriasis rash to all extremities and torso.mixture of plaque psoriasis and guttate psoriasis. Does not wish to go to derm. Otezla starter pack provided to pt today. Proper dosing discussed in detail. Follow up in 2 weeks for reevaluation, if not better, will refer to dermatology. Pt agrees   Vitamin D deficiency Labs pending.  Continue repletion therapy. If indicated, will change repletion dosage. Eat foods rich in Vit D including milk, orange juice, yogurt with vitamin D added, salmon or mackerel, canned tuna fish, cereals with vitamin D added, and cod liver oil. Get out in the sun but make sure to wear at least SPF 30 sunscreen.  -     VITAMIN D 25 Hydroxy (Vit-D Deficiency, Fractures)     Continue all other maintenance medications.  Follow up plan: Return in about 2 weeks (around 06/13/2022) for psoriasis, labs.   Continue healthy lifestyle choices, including diet (rich in fruits, vegetables, and lean proteins, and low in salt and simple carbohydrates) and exercise (at least 30 minutes of moderate physical activity daily).  Educational handout given for psoriasis and [place]  The above assessment and management plan was discussed with the patient. The patient verbalized understanding of and has agreed to the management plan. Patient is aware to call the clinic if they develop any new symptoms or if symptoms persist or worsen. Patient is aware when to return to the clinic for a follow-up visit. Patient educated on when it is appropriate to go to the emergency department.   Kari BaarsMichelle Monica Codd, FNP-C [facility] [phone]

## 2022-05-31 DIAGNOSIS — E119 Type 2 diabetes mellitus without complications: Secondary | ICD-10-CM | POA: Diagnosis not present

## 2022-06-03 DIAGNOSIS — E119 Type 2 diabetes mellitus without complications: Secondary | ICD-10-CM | POA: Diagnosis not present

## 2022-06-04 DIAGNOSIS — E119 Type 2 diabetes mellitus without complications: Secondary | ICD-10-CM | POA: Diagnosis not present

## 2022-06-05 DIAGNOSIS — E119 Type 2 diabetes mellitus without complications: Secondary | ICD-10-CM | POA: Diagnosis not present

## 2022-06-06 DIAGNOSIS — E119 Type 2 diabetes mellitus without complications: Secondary | ICD-10-CM | POA: Diagnosis not present

## 2022-06-07 DIAGNOSIS — E119 Type 2 diabetes mellitus without complications: Secondary | ICD-10-CM | POA: Diagnosis not present

## 2022-06-10 DIAGNOSIS — E119 Type 2 diabetes mellitus without complications: Secondary | ICD-10-CM | POA: Diagnosis not present

## 2022-06-11 DIAGNOSIS — E119 Type 2 diabetes mellitus without complications: Secondary | ICD-10-CM | POA: Diagnosis not present

## 2022-06-11 NOTE — Addendum Note (Signed)
Addended by: Baruch Gouty on: 06/11/2022 08:05 AM   Modules accepted: Orders

## 2022-06-12 DIAGNOSIS — E119 Type 2 diabetes mellitus without complications: Secondary | ICD-10-CM | POA: Diagnosis not present

## 2022-06-13 DIAGNOSIS — E119 Type 2 diabetes mellitus without complications: Secondary | ICD-10-CM | POA: Diagnosis not present

## 2022-06-13 LAB — CMP14+EGFR
ALT: 14 IU/L (ref 0–32)
AST: 20 IU/L (ref 0–40)
Albumin/Globulin Ratio: 1.5 (ref 1.2–2.2)
Albumin: 3.8 g/dL (ref 3.8–4.8)
Alkaline Phosphatase: 63 IU/L (ref 44–121)
BUN/Creatinine Ratio: 12 (ref 12–28)
BUN: 13 mg/dL (ref 8–27)
Bilirubin Total: 0.4 mg/dL (ref 0.0–1.2)
CO2: 23 mmol/L (ref 20–29)
Calcium: 9.5 mg/dL (ref 8.7–10.3)
Chloride: 99 mmol/L (ref 96–106)
Creatinine, Ser: 1.12 mg/dL — ABNORMAL HIGH (ref 0.57–1.00)
Globulin, Total: 2.5 g/dL (ref 1.5–4.5)
Glucose: 101 mg/dL — ABNORMAL HIGH (ref 70–99)
Potassium: 4 mmol/L (ref 3.5–5.2)
Sodium: 140 mmol/L (ref 134–144)
Total Protein: 6.3 g/dL (ref 6.0–8.5)
eGFR: 51 mL/min/{1.73_m2} — ABNORMAL LOW (ref >59)

## 2022-06-13 LAB — LIPID PANEL
Chol/HDL Ratio: 2.9 ratio (ref 0.0–4.4)
Cholesterol, Total: 134 mg/dL (ref 100–199)
HDL: 46 mg/dL (ref >39)
LDL Chol Calc (NIH): 52 mg/dL (ref 0–99)
Triglycerides: 229 mg/dL — ABNORMAL HIGH (ref 0–149)
VLDL Cholesterol Cal: 36 mg/dL (ref 5–40)

## 2022-06-13 LAB — DRUG SCREEN 13 W/CONF , SERUM
Amphetamines, IA: NEGATIVE ng/mL
Barbiturates, IA: NEGATIVE ug/mL
Benzodiazepines, IA: NEGATIVE ng/mL
Cocaine & Metabolite, IA: NEGATIVE ng/mL
FENTANYL, IA: NEGATIVE ng/mL
MEPERIDINE, IA: NEGATIVE ng/mL
Methadone, IA: NEGATIVE ng/mL
Opiates, IA: NEGATIVE ng/mL
Oxycodones, IA: NEGATIVE ng/mL
Phencyclidine, IA: NEGATIVE ng/mL
Propoxyphene, IA: NEGATIVE ng/mL
THC(Marijuana) Metabolite, IA: NEGATIVE ng/mL
TRAMADOL, IA: POSITIVE ng/mL — AB

## 2022-06-13 LAB — TRAMADOL,MS,WB/SP RFX
O-Desmethyltramadol: 76.1 ng/mL
Tramadol Confirmation: POSITIVE

## 2022-06-13 LAB — CBC WITH DIFFERENTIAL/PLATELET
Basophils Absolute: 0.1 10*3/uL (ref 0.0–0.2)
Basos: 1 %
EOS (ABSOLUTE): 0.8 10*3/uL — ABNORMAL HIGH (ref 0.0–0.4)
Eos: 5 %
Hematocrit: 38.7 % (ref 34.0–46.6)
Hemoglobin: 12.8 g/dL (ref 11.1–15.9)
Immature Grans (Abs): 0 10*3/uL (ref 0.0–0.1)
Immature Granulocytes: 0 %
Lymphocytes Absolute: 3.3 10*3/uL — ABNORMAL HIGH (ref 0.7–3.1)
Lymphs: 21 %
MCH: 33.1 pg — ABNORMAL HIGH (ref 26.6–33.0)
MCHC: 33.1 g/dL (ref 31.5–35.7)
MCV: 100 fL — ABNORMAL HIGH (ref 79–97)
Monocytes Absolute: 1.2 10*3/uL — ABNORMAL HIGH (ref 0.1–0.9)
Monocytes: 8 %
Neutrophils Absolute: 10.4 10*3/uL — ABNORMAL HIGH (ref 1.4–7.0)
Neutrophils: 65 %
Platelets: 264 10*3/uL (ref 150–450)
RBC: 3.87 x10E6/uL (ref 3.77–5.28)
RDW: 12.7 % (ref 11.7–15.4)
WBC: 15.9 10*3/uL — ABNORMAL HIGH (ref 3.4–10.8)

## 2022-06-13 LAB — THYROID PANEL WITH TSH
Free Thyroxine Index: 1.9 (ref 1.2–4.9)
T3 Uptake Ratio: 25 % (ref 24–39)
T4, Total: 7.6 ug/dL (ref 4.5–12.0)
TSH: 1.37 u[IU]/mL (ref 0.450–4.500)

## 2022-06-13 LAB — VITAMIN D 25 HYDROXY (VIT D DEFICIENCY, FRACTURES): Vit D, 25-Hydroxy: 31.9 ng/mL (ref 30.0–100.0)

## 2022-06-19 ENCOUNTER — Encounter: Payer: Self-pay | Admitting: Family Medicine

## 2022-06-19 ENCOUNTER — Ambulatory Visit (INDEPENDENT_AMBULATORY_CARE_PROVIDER_SITE_OTHER): Payer: Medicare HMO | Admitting: Family Medicine

## 2022-06-19 VITALS — BP 149/79 | HR 100 | Temp 96.8°F | Ht 64.0 in | Wt 161.0 lb

## 2022-06-19 DIAGNOSIS — L409 Psoriasis, unspecified: Secondary | ICD-10-CM | POA: Diagnosis not present

## 2022-06-19 DIAGNOSIS — Z79899 Other long term (current) drug therapy: Secondary | ICD-10-CM | POA: Diagnosis not present

## 2022-06-19 MED ORDER — CERAVE DIABETICS DRY SKIN EX CREA: 1.0000 | TOPICAL_CREAM | Freq: Two times a day (BID) | CUTANEOUS | 11 refills | Status: DC

## 2022-06-19 MED ORDER — OTEZLA 30 MG PO TABS: 30.0000 mg | ORAL_TABLET | Freq: Two times a day (BID) | ORAL | 3 refills | Status: AC

## 2022-06-19 NOTE — Progress Notes (Signed)
Subjective:  Patient ID: Denise Underwood, female    DOB: 05-16-1945, 77 y.o.   MRN: [id number]  Patient Care Team: Sonny Masters, FNP as PCP - General (Family Medicine) Magrinat, Valentino Hue, MD (Inactive) as Consulting Physician (Oncology) Toma Deiters, MD (Internal Medicine) Ubaldo Glassing Lebron Conners, MD as Consulting Physician (Internal Medicine)   Chief Complaint:  Psoriasis (2 week follow up - patient states it is a little better )   HPI: Denise Underwood is a 77 y.o. female presenting on 06/19/2022 for Psoriasis (2 week follow up - patient states it is a little better )   Psoriasis This is a recurrent problem. The current episode started more than 1 year ago. The problem occurs constantly. The problem has been gradually improving since onset. The lesions are diffuse. Associated symptoms include arthritis, itching, nail changes and plaques. Pertinent negatives include no pain or pustules. The symptoms are aggravated by stress. Past treatments include moisturizers [place]). The treatment provided mild relief. She has been using treatment for 1 to 4 weeks.    Relevant past medical, surgical, family, and social history reviewed and updated as indicated.  Allergies and medications reviewed and updated. Data reviewed: Chart in Epic.   Past Medical History:  Diagnosis Date   Anxiety    Arthritis    Hyperlipidemia    Hypertension    Osteopenia 07/04/2021   Prediabetes 07/09/2021   Psoriasis     Past Surgical History:  Procedure Laterality Date   ABDOMINAL HYSTERECTOMY     CHOLECYSTECTOMY     HERNIA REPAIR     KNEE SURGERY     TUBAL LIGATION      Social History   Socioeconomic History   Marital status: Single    Spouse name: Not on file   Number of children: 2   Years of education: Not on file   Highest education level: 9th grade  Occupational History   Occupation: Retired  Tobacco Use   Smoking status: Every Day    Packs/day: 0.50    Years: 40.00    Total pack years:  20.00    Types: Cigarettes   Smokeless tobacco: Never   Tobacco comments:    Smoking since she was 29 or younger  Vaping Use   Vaping Use: Never used  Substance and Sexual Activity   Alcohol use: No   Drug use: No   Sexual activity: Not Currently    Birth control/protection: Surgical  Other Topics Concern   Not on file  Social History Narrative   She lives alone on ground level apartment - has children.    Daughter, [name] is her main caregiver and means for transportation.   She also has an aide that comes in for 2 hours on weekdays to help with bathing, meals, meds etc   Social Determinants of Health   Financial Resource Strain: Low Risk  (11/05/2021)   Overall Financial Resource Strain (CARDIA)    Difficulty of Paying Living Expenses: Not hard at all  Food Insecurity: No Food Insecurity (11/05/2021)   Hunger Vital Sign    Worried About Running Out of Food in the Last Year: Never true    Ran Out of Food in the Last Year: Never true  Transportation Needs: No Transportation Needs (11/05/2021)   PRAPARE - Administrator, Civil Service (Medical): No    Lack of Transportation (Non-Medical): No  Physical Activity: Insufficiently Active (11/05/2021)   Exercise Vital Sign    Days  of Exercise per Week: 7 days    Minutes of Exercise per Session: 10 min  Stress: No Stress Concern Present (11/05/2021)   [place]    Feeling of Stress : Only a little  Social Connections: Socially Isolated (11/05/2021)   Social Connection and Isolation Panel [NHANES]    Frequency of Communication with Friends and Family: More than three times a week    Frequency of Social Gatherings with Friends and Family: Twice a week    Attends Religious Services: Never    Marine scientist or Organizations: No    Attends Archivist Meetings: Never    Marital Status: Divorced  Human resources officer Violence: Not At Risk (11/05/2021)    Humiliation, Afraid, Rape, and Kick questionnaire    Fear of Current or Ex-Partner: No    Emotionally Abused: No    Physically Abused: No    Sexually Abused: No    Outpatient Encounter Medications as of 06/19/2022  Medication Sig   albuterol (VENTOLIN HFA) 108 (90 Base) MCG/ACT inhaler Inhale 2 puffs into the lungs every 6 (six) hours as needed.   Apremilast (OTEZLA) 30 MG TABS Take 1 tablet (30 mg total) by mouth in the morning and at bedtime.   CALCIUM PO Take by mouth daily.   Cholecalciferol (VITAMIN D3) 1000 units CAPS Take by mouth.   diclofenac Sodium (VOLTAREN) 1 % GEL Apply 2 g topically 4 (four) times daily.   Emollient (CERAVE DIABETICS DRY SKIN) CREA Apply 1 Application topically 2 (two) times daily.   escitalopram (LEXAPRO) 20 MG tablet Take 1 tablet (20 mg total) by mouth daily.   fluticasone (FLONASE) 50 MCG/ACT nasal spray Place 2 sprays into both nostrils daily.   hydrochlorothiazide (HYDRODIURIL) 25 MG tablet Take 1 tablet (25 mg total) by mouth daily as needed.   hydrocortisone 2.5 % cream Apply topically 2 (two) times daily.   meloxicam (MOBIC) 7.5 MG tablet Take 1 tablet (7.5 mg total) by mouth daily.   naloxone (NARCAN) nasal spray 4 mg/0.1 mL As needed or respiratory depression or decreased mental status related to narcotic use / overdose   nystatin (MYCOSTATIN/NYSTOP) powder Apply 1 application. topically 3 (three) times daily.   rosuvastatin (CRESTOR) 20 MG tablet Take 1 tablet (20 mg total) by mouth daily.   traMADol (ULTRAM) 50 MG tablet Take 2 tablets (100 mg total) by mouth every 12 (twelve) hours.   traZODone (DESYREL) 50 MG tablet Take 1 tablet (50 mg total) by mouth at bedtime.   triamcinolone (KENALOG) 0.025 % ointment APPLY 1 APPLICATION TOPICALLY 2 TIMES A DAY.   No facility-administered encounter medications on file as of 06/19/2022.    No Known Allergies  Review of Systems  Constitutional:  Negative for activity change, appetite change, chills,  diaphoresis, fatigue, fever and unexpected weight change.  HENT: Negative.    Eyes: Negative.  Negative for photophobia and visual disturbance.  Respiratory:  Negative for cough, chest tightness and shortness of breath.   Cardiovascular:  Negative for chest pain, palpitations and leg swelling.  Gastrointestinal:  Negative for abdominal pain, blood in stool, constipation, diarrhea, nausea and vomiting.  Endocrine: Negative.   Genitourinary:  Negative for difficulty urinating, dysuria, frequency and urgency.  Musculoskeletal:  Positive for arthralgias, arthritis and gait problem. Negative for back pain, joint swelling, myalgias, neck pain and neck stiffness.  Skin:  Positive for color change, itching, nail changes and rash. Negative for pallor and wound.  Allergic/Immunologic:  Negative.   Neurological:  Negative for dizziness, tremors, seizures, syncope, facial asymmetry, speech difficulty, weakness, light-headedness, numbness and headaches.  Hematological: Negative.   Psychiatric/Behavioral:  Negative for confusion, hallucinations, sleep disturbance and suicidal ideas.   All other systems reviewed and are negative.       Objective:  BP (!) 149/79   Pulse 100   Temp (!) 96.8 F (36 C) (Temporal)   Ht 5\' 4"  (1.626 m)   Wt 161 lb (73 kg)   SpO2 92%   BMI 27.64 kg/m    Wt Readings from Last 3 Encounters:  06/19/22 161 lb (73 kg)  05/30/22 162 lb 6.4 oz (73.7 kg)  03/28/22 155 lb 12.8 oz (70.7 kg)    Physical Exam Vitals and nursing note reviewed.  Constitutional:      General: She is not in acute distress.    Appearance: Normal appearance. She is well-developed and well-groomed. She is obese. She is ill-appearing (chronically). She is not toxic-appearing or diaphoretic.  HENT:     Head: Normocephalic and atraumatic.     Jaw: There is normal jaw occlusion.     Right Ear: Hearing normal.     Left Ear: Hearing normal.     Nose: Nose normal.     Mouth/Throat:     Lips: Pink.      Mouth: Mucous membranes are moist.     Pharynx: Uvula midline.  Eyes:     General: Lids are normal.     Conjunctiva/sclera: Conjunctivae normal.     Pupils: Pupils are equal, round, and reactive to light.  Neck:     Thyroid: No thyroid mass, thyromegaly or thyroid tenderness.     Vascular: No carotid bruit or JVD.     Trachea: Trachea and phonation normal.  Cardiovascular:     Rate and Rhythm: Normal rate and regular rhythm.     Chest Wall: PMI is not displaced.     Pulses: Normal pulses.     Heart sounds: Normal heart sounds. No murmur heard.    No friction rub. No gallop.  Pulmonary:     Effort: Pulmonary effort is normal. No respiratory distress.     Breath sounds: Normal breath sounds. No wheezing.  Abdominal:     General: There is no abdominal bruit.     Palpations: There is no hepatomegaly or splenomegaly.  Musculoskeletal:     Cervical back: Neck supple.     Right lower leg: No edema.     Left lower leg: No edema.  Lymphadenopathy:     Cervical: No cervical adenopathy.  Skin:    General: Skin is warm and dry.     Capillary Refill: Capillary refill takes less than 2 seconds.     Coloration: Skin is not cyanotic, jaundiced or pale.     Findings: Rash present. Rash is crusting, papular and scaling.     Comments: Well-demarcated, scaly, erythematous papules and plaques to bilateral upper and lower extremities. Multiple erythematous papules with scale on the back and chest. Improved greatly since last visit.   Neurological:     General: No focal deficit present.     Mental Status: She is alert and oriented to person, place, and time.     Sensory: Sensation is intact.     Motor: Motor function is intact.     Coordination: Coordination is intact.     Gait: Gait abnormal (using rolling walker).     Deep Tendon Reflexes: Reflexes are normal and symmetric.  Psychiatric:  Attention and Perception: Attention and perception normal.        Mood and Affect: Mood and affect  normal.        Speech: Speech normal.        Behavior: Behavior normal. Behavior is cooperative.        Thought Content: Thought content normal.        Cognition and Memory: Cognition and memory normal.        Judgment: Judgment normal.     Results for orders placed or performed in visit on 05/30/22  CMP14+EGFR  Result Value Ref Range   Glucose 101 (H) 70 - 99 mg/dL   BUN 13 8 - 27 mg/dL   Creatinine, Ser 6.75 (H) 0.57 - 1.00 mg/dL   eGFR 51 (L) >44 BE/EFE/0.71   BUN/Creatinine Ratio 12 12 - 28   Sodium 140 134 - 144 mmol/L   Potassium 4.0 3.5 - 5.2 mmol/L   Chloride 99 96 - 106 mmol/L   CO2 23 20 - 29 mmol/L   Calcium 9.5 8.7 - 10.3 mg/dL   Total Protein 6.3 6.0 - 8.5 g/dL   Albumin 3.8 3.8 - 4.8 g/dL   Globulin, Total 2.5 1.5 - 4.5 g/dL   Albumin/Globulin Ratio 1.5 1.2 - 2.2   Bilirubin Total 0.4 0.0 - 1.2 mg/dL   Alkaline Phosphatase 63 44 - 121 IU/L   AST 20 0 - 40 IU/L   ALT 14 0 - 32 IU/L  CBC with Differential/Platelet  Result Value Ref Range   WBC 15.9 (H) 3.4 - 10.8 x10E3/uL   RBC 3.87 3.77 - 5.28 x10E6/uL   Hemoglobin 12.8 11.1 - 15.9 g/dL   Hematocrit 21.9 75.8 - 46.6 %   MCV 100 (H) 79 - 97 fL   MCH 33.1 (H) 26.6 - 33.0 pg   MCHC 33.1 31.5 - 35.7 g/dL   RDW 83.2 54.9 - 82.6 %   Platelets 264 150 - 450 x10E3/uL   Neutrophils 65 Not Estab. %   Lymphs 21 Not Estab. %   Monocytes 8 Not Estab. %   Eos 5 Not Estab. %   Basos 1 Not Estab. %   Neutrophils Absolute 10.4 (H) 1.4 - 7.0 x10E3/uL   Lymphocytes Absolute 3.3 (H) 0.7 - 3.1 x10E3/uL   Monocytes Absolute 1.2 (H) 0.1 - 0.9 x10E3/uL   EOS (ABSOLUTE) 0.8 (H) 0.0 - 0.4 x10E3/uL   Basophils Absolute 0.1 0.0 - 0.2 x10E3/uL   Immature Granulocytes 0 Not Estab. %   Immature Grans (Abs) 0.0 0.0 - 0.1 x10E3/uL  Thyroid Panel With TSH  Result Value Ref Range   TSH 1.370 0.450 - 4.500 uIU/mL   T4, Total 7.6 4.5 - 12.0 ug/dL   T3 Uptake Ratio 25 24 - 39 %   Free Thyroxine Index 1.9 1.2 - 4.9  Lipid panel   Result Value Ref Range   Cholesterol, Total 134 100 - 199 mg/dL   Triglycerides 415 (H) 0 - 149 mg/dL   HDL 46 >83 mg/dL   VLDL Cholesterol Cal 36 5 - 40 mg/dL   LDL Chol Calc (NIH) 52 0 - 99 mg/dL   Chol/HDL Ratio 2.9 0.0 - 4.4 ratio  VITAMIN D 25 Hydroxy (Vit-D Deficiency, Fractures)  Result Value Ref Range   Vit D, 25-Hydroxy 31.9 30.0 - 100.0 ng/mL  Drug Screen 13 with reflex Confirmation (AMP,BAR,BZO,COC,PCP,THC,OPI,OXY,MD,FEN,MEP,PPX,TRAM), Serum  Result Value Ref Range   Amphetamines, IA Negative Cutoff:50 ng/mL   Barbiturates, IA Negative Cutoff:0.1 ug/mL  Benzodiazepines, IA Negative Cutoff:20 ng/mL   Cocaine & Metabolite, IA Negative Cutoff:25 ng/mL   Phencyclidine, IA Negative Cutoff:8 ng/mL   THC(Marijuana) Metabolite, IA Negative Cutoff:5 ng/mL   Opiates, IA Negative Cutoff:5 ng/mL   Oxycodones, IA Negative Cutoff:5 ng/mL   Methadone, IA Negative Cutoff:25 ng/mL   FENTANYL, IA Negative Cutoff:1.0 ng/mL   Propoxyphene, IA Negative Cutoff:50 ng/mL   MEPERIDINE, IA Negative Cutoff:100 ng/mL   TRAMADOL, IA ++POSITIVE++ (A) Cutoff:50 ng/mL  Tramadol,MS,WB/SP RFX  Result Value Ref Range   Tramadol Confirmation Positive    Tramadol PRESENT ng/mL   O-Desmethyltramadol 76.1 ng/mL       Pertinent labs & imaging results that were available during my care of the patient were reviewed by me and considered in my medical decision making.  Assessment & Plan:  Cleon was seen today for psoriasis.  Diagnoses and all orders for this visit:  Psoriasis High risk medication use Psoriasis has improved greatly. Will continue [place]. Aware to use emollient cream twice daily. Will repeat CMP today as [place] is a high risk medication.  -     Apremilast (OTEZLA) 30 MG TABS; Take 1 tablet (30 mg total) by mouth in the morning and at bedtime. -     CMP14+EGFR -     Emollient (CERAVE DIABETICS DRY SKIN) CREA; Apply 1 Application topically 2 (two) times daily.     Continue all other  maintenance medications.  Follow up plan: Return in about 3 months (around 09/17/2022), or if symptoms worsen or fail to improve, for psoriasis.   Continue healthy lifestyle choices, including diet (rich in fruits, vegetables, and lean proteins, and low in salt and simple carbohydrates) and exercise (at least 30 minutes of moderate physical activity daily).  Educational handout given for psoriasis  The above assessment and management plan was discussed with the patient. The patient verbalized understanding of and has agreed to the management plan. Patient is aware to call the clinic if they develop any new symptoms or if symptoms persist or worsen. Patient is aware when to return to the clinic for a follow-up visit. Patient educated on when it is appropriate to go to the emergency department.   Monia Pouch, FNP-C [facility] [phone]

## 2022-06-20 ENCOUNTER — Telehealth: Payer: Self-pay | Admitting: Family Medicine

## 2022-06-21 ENCOUNTER — Telehealth: Payer: Self-pay | Admitting: Family Medicine

## 2022-06-21 NOTE — Telephone Encounter (Signed)
Patient aware and verbalizes understanding. States she does not want to go to dermatology- she will just keep using the cream that was prescribed.

## 2022-06-21 NOTE — Telephone Encounter (Signed)
Patient said that a medication was called in for her psoriasis but the pharmacy told her that it was going to be $5,000 and she wants to know if something else can be called in. Please call back to let her know.

## 2022-06-21 NOTE — Telephone Encounter (Signed)
According to your last message in the phone calls - this is the only thing or she would have to go to dermatology?

## 2022-06-21 NOTE — Telephone Encounter (Signed)
Attempted to contact patient - NA °

## 2022-06-24 NOTE — Telephone Encounter (Signed)
Patient aware and states the cream she was using is helping more.  She still does not want to go to dermatology.

## 2022-06-25 DIAGNOSIS — R69 Illness, unspecified: Secondary | ICD-10-CM | POA: Diagnosis not present

## 2022-06-25 DIAGNOSIS — Z791 Long term (current) use of non-steroidal anti-inflammatories (NSAID): Secondary | ICD-10-CM | POA: Diagnosis not present

## 2022-06-25 DIAGNOSIS — L309 Dermatitis, unspecified: Secondary | ICD-10-CM | POA: Diagnosis not present

## 2022-06-25 DIAGNOSIS — F3341 Major depressive disorder, recurrent, in partial remission: Secondary | ICD-10-CM | POA: Diagnosis not present

## 2022-06-25 DIAGNOSIS — G629 Polyneuropathy, unspecified: Secondary | ICD-10-CM | POA: Diagnosis not present

## 2022-06-25 DIAGNOSIS — K219 Gastro-esophageal reflux disease without esophagitis: Secondary | ICD-10-CM | POA: Diagnosis not present

## 2022-06-25 DIAGNOSIS — Z8249 Family history of ischemic heart disease and other diseases of the circulatory system: Secondary | ICD-10-CM | POA: Diagnosis not present

## 2022-06-25 DIAGNOSIS — R32 Unspecified urinary incontinence: Secondary | ICD-10-CM | POA: Diagnosis not present

## 2022-06-25 DIAGNOSIS — M1990 Unspecified osteoarthritis, unspecified site: Secondary | ICD-10-CM | POA: Diagnosis not present

## 2022-06-25 DIAGNOSIS — I10 Essential (primary) hypertension: Secondary | ICD-10-CM | POA: Diagnosis not present

## 2022-06-25 DIAGNOSIS — Z825 Family history of asthma and other chronic lower respiratory diseases: Secondary | ICD-10-CM | POA: Diagnosis not present

## 2022-06-25 DIAGNOSIS — G4700 Insomnia, unspecified: Secondary | ICD-10-CM | POA: Diagnosis not present

## 2022-06-25 DIAGNOSIS — Z008 Encounter for other general examination: Secondary | ICD-10-CM | POA: Diagnosis not present

## 2022-06-25 DIAGNOSIS — E785 Hyperlipidemia, unspecified: Secondary | ICD-10-CM | POA: Diagnosis not present

## 2022-07-01 ENCOUNTER — Other Ambulatory Visit: Payer: Self-pay | Admitting: Family Medicine

## 2022-07-01 DIAGNOSIS — M159 Polyosteoarthritis, unspecified: Secondary | ICD-10-CM

## 2022-07-01 DIAGNOSIS — Z79899 Other long term (current) drug therapy: Secondary | ICD-10-CM

## 2022-09-17 ENCOUNTER — Ambulatory Visit (INDEPENDENT_AMBULATORY_CARE_PROVIDER_SITE_OTHER): Payer: Medicare HMO | Admitting: Family Medicine

## 2022-09-17 ENCOUNTER — Encounter: Payer: Self-pay | Admitting: Family Medicine

## 2022-09-17 VITALS — BP 125/82 | HR 96 | Temp 98.1°F | Ht 64.0 in | Wt 157.6 lb

## 2022-09-17 DIAGNOSIS — Z79899 Other long term (current) drug therapy: Secondary | ICD-10-CM | POA: Diagnosis not present

## 2022-09-17 DIAGNOSIS — L408 Other psoriasis: Secondary | ICD-10-CM

## 2022-09-17 DIAGNOSIS — M159 Polyosteoarthritis, unspecified: Secondary | ICD-10-CM | POA: Diagnosis not present

## 2022-09-17 MED ORDER — TRAMADOL HCL 50 MG PO TABS
100.0000 mg | ORAL_TABLET | Freq: Two times a day (BID) | ORAL | 0 refills | Status: DC
Start: 2022-11-16 — End: 2022-12-25

## 2022-09-17 MED ORDER — TRAMADOL HCL 50 MG PO TABS
100.0000 mg | ORAL_TABLET | Freq: Two times a day (BID) | ORAL | 0 refills | Status: DC
Start: 2022-09-17 — End: 2022-12-25

## 2022-09-17 MED ORDER — TRIAMCINOLONE ACETONIDE 0.025 % EX OINT: TOPICAL_OINTMENT | CUTANEOUS | 1 refills | Status: DC

## 2022-09-17 MED ORDER — TRAMADOL HCL 50 MG PO TABS
100.0000 mg | ORAL_TABLET | Freq: Two times a day (BID) | ORAL | 0 refills | Status: DC
Start: 2022-10-17 — End: 2022-12-25

## 2022-09-17 NOTE — Progress Notes (Signed)
Subjective:  Patient ID: Denise Underwood, female    DOB: 1945/12/26, 77 y.o.   MRN: [id number]  Patient Care Team: Sonny Masters, FNP as PCP - General (Family Medicine) Magrinat, Valentino Hue, MD (Inactive) as Consulting Physician (Oncology) Toma Deiters, MD (Internal Medicine) Ubaldo Glassing Lebron Conners, MD as Consulting Physician (Internal Medicine)   Chief Complaint:  Psoriasis (3 month follow up ) and Pain   HPI: Denise Underwood is a 77 y.o. female presenting on 09/17/2022 for Psoriasis (3 month follow up ) and Pain   1. Inverse psoriasis Psoriasis has improved greatly with topical emollients and steroid cream. No current plaques.   2. Controlled substance agreement signed 3. Primary osteoarthritis involving multiple joints Pain assessment: Cause of pain- OA or multiple joints Pain location- multiple sites Pain on scale of 1-10- 8/10 most days Frequency- daily What increases pain-activity, cold weather What makes pain Better-medications Effects on ADL - significant Any change in general medical condition-no  Current opioids rx- tramadol 100 mg twice daily # meds rx- 120 Effectiveness of current meds-good, pain down to 2-4/10 with medications Adverse reactions from pain meds-none Morphine equivalent- 20 MME/Day  Pill count performed-No Last drug screen - 05/30/2022 Urine drug screen today- No Was the [facility] reviewed- yes  If yes were their any concerning findings? - no   Overdose risk: low    06/22/2020   12:21 PM  Opioid Risk   Alcohol 0  Illegal Drugs 0  Rx Drugs 0  Alcohol 0  Illegal Drugs 0  Rx Drugs 0  Age between 16-45 years  0  History of Preadolescent Sexual Abuse 0  Psychological Disease 0  Depression 0  Opioid Risk Tool Scoring 0  Opioid Risk Interpretation Low Risk      Relevant past medical, surgical, family, and social history reviewed and updated as indicated.  Allergies and medications reviewed and updated. Data reviewed: Chart in  Epic.   Past Medical History:  Diagnosis Date   Anxiety    Arthritis    Hyperlipidemia    Hypertension    Osteopenia 07/04/2021   Prediabetes 07/09/2021   Psoriasis     Past Surgical History:  Procedure Laterality Date   ABDOMINAL HYSTERECTOMY     CHOLECYSTECTOMY     HERNIA REPAIR     KNEE SURGERY     TUBAL LIGATION      Social History   Socioeconomic History   Marital status: Single    Spouse name: Not on file   Number of children: 2   Years of education: Not on file   Highest education level: 9th grade  Occupational History   Occupation: Retired  Tobacco Use   Smoking status: Every Day    Packs/day: 0.50    Years: 40.00    Additional pack years: 0.00    Total pack years: 20.00    Types: Cigarettes   Smokeless tobacco: Never   Tobacco comments:    Smoking since she was 49 or younger  Vaping Use   Vaping Use: Never used  Substance and Sexual Activity   Alcohol use: No   Drug use: No   Sexual activity: Not Currently    Birth control/protection: Surgical  Other Topics Concern   Not on file  Social History Narrative   She lives alone on ground level apartment - has children.    Daughter, [name] is her main caregiver and means for transportation.   She also has an aide that comes in  for 2 hours on weekdays to help with bathing, meals, meds etc   Social Determinants of Health   Financial Resource Strain: Low Risk  (11/05/2021)   Overall Financial Resource Strain (CARDIA)    Difficulty of Paying Living Expenses: Not hard at all  Food Insecurity: No Food Insecurity (11/05/2021)   Hunger Vital Sign    Worried About Running Out of Food in the Last Year: Never true    Ran Out of Food in the Last Year: Never true  Transportation Needs: No Transportation Needs (11/05/2021)   PRAPARE - Administrator, Civil Service (Medical): No    Lack of Transportation (Non-Medical): No  Physical Activity: Insufficiently Active (11/05/2021)   Exercise Vital Sign     Days of Exercise per Week: 7 days    Minutes of Exercise per Session: 10 min  Stress: No Stress Concern Present (11/05/2021)   [employer] of Occupational Health - Occupational Stress Questionnaire    Feeling of Stress : Only a little  Social Connections: Socially Isolated (11/05/2021)   Social Connection and Isolation Panel [NHANES]    Frequency of Communication with Friends and Family: More than three times a week    Frequency of Social Gatherings with Friends and Family: Twice a week    Attends Religious Services: Never    Database administrator or Organizations: No    Attends Banker Meetings: Never    Marital Status: Divorced  Catering manager Violence: Not At Risk (11/05/2021)   Humiliation, Afraid, Rape, and Kick questionnaire    Fear of Current or Ex-Partner: No    Emotionally Abused: No    Physically Abused: No    Sexually Abused: No    Outpatient Encounter Medications as of 09/17/2022  Medication Sig   albuterol (VENTOLIN HFA) 108 (90 Base) MCG/ACT inhaler Inhale 2 puffs into the lungs every 6 (six) hours as needed.   CALCIUM PO Take by mouth daily.   Cholecalciferol (VITAMIN D3) 1000 units CAPS Take by mouth.   diclofenac Sodium (VOLTAREN) 1 % GEL Apply 2 g topically 4 (four) times daily.   Emollient (CERAVE DIABETICS DRY SKIN) CREA Apply 1 Application topically 2 (two) times daily.   escitalopram (LEXAPRO) 20 MG tablet Take 1 tablet (20 mg total) by mouth daily.   fluticasone (FLONASE) 50 MCG/ACT nasal spray Place 2 sprays into both nostrils daily.   hydrochlorothiazide (HYDRODIURIL) 25 MG tablet Take 1 tablet (25 mg total) by mouth daily as needed.   hydrocortisone 2.5 % cream Apply topically 2 (two) times daily.   meloxicam (MOBIC) 7.5 MG tablet TAKE 1 TABLET BY MOUTH ONCE DAILY.   naloxone (NARCAN) nasal spray 4 mg/0.1 mL As needed or respiratory depression or decreased mental status related to narcotic use / overdose   nystatin (MYCOSTATIN/NYSTOP)  powder Apply 1 application. topically 3 (three) times daily.   rosuvastatin (CRESTOR) 20 MG tablet Take 1 tablet (20 mg total) by mouth daily.   [START ON 11/16/2022] traMADol (ULTRAM) 50 MG tablet Take 2 tablets (100 mg total) by mouth 2 (two) times daily.   [START ON 10/17/2022] traMADol (ULTRAM) 50 MG tablet Take 2 tablets (100 mg total) by mouth 2 (two) times daily.   traZODone (DESYREL) 50 MG tablet Take 1 tablet (50 mg total) by mouth at bedtime.   [DISCONTINUED] traMADol (ULTRAM) 50 MG tablet Take 2 tablets (100 mg total) by mouth every 12 (twelve) hours.   [DISCONTINUED] triamcinolone (KENALOG) 0.025 % ointment APPLY 1 APPLICATION TOPICALLY  2 TIMES A DAY.   traMADol (ULTRAM) 50 MG tablet Take 2 tablets (100 mg total) by mouth every 12 (twelve) hours.   triamcinolone (KENALOG) 0.025 % ointment APPLY 1 APPLICATION TOPICALLY 2 TIMES A DAY.   No facility-administered encounter medications on file as of 09/17/2022.    No Known Allergies  Review of Systems  Constitutional:  Negative for activity change, appetite change, chills, diaphoresis, fatigue, fever and unexpected weight change.  HENT: Negative.    Eyes: Negative.  Negative for photophobia and visual disturbance.  Respiratory:  Negative for cough, chest tightness and shortness of breath.   Cardiovascular:  Negative for chest pain, palpitations and leg swelling.  Gastrointestinal:  Negative for abdominal pain, blood in stool, constipation, diarrhea, nausea and vomiting.  Endocrine: Negative.  Negative for cold intolerance, heat intolerance, polydipsia, polyphagia and polyuria.  Genitourinary:  Negative for decreased urine volume, difficulty urinating, dysuria, frequency and urgency.  Musculoskeletal:  Positive for arthralgias, back pain, gait problem, joint swelling and myalgias. Negative for neck pain and neck stiffness.  Skin: Negative.   Allergic/Immunologic: Negative.   Neurological:  Negative for dizziness, tremors, seizures,  syncope, facial asymmetry, speech difficulty, weakness, light-headedness, numbness and headaches.  Hematological: Negative.   Psychiatric/Behavioral:  Negative for confusion, hallucinations, sleep disturbance and suicidal ideas.   All other systems reviewed and are negative.       Objective:  BP 125/82   Pulse 96   Temp 98.1 F (36.7 C) (Temporal)   Ht 5\' 4"  (1.626 m)   Wt 157 lb 9.6 oz (71.5 kg)   SpO2 92%   BMI 27.05 kg/m    Wt Readings from Last 3 Encounters:  09/17/22 157 lb 9.6 oz (71.5 kg)  06/19/22 161 lb (73 kg)  05/30/22 162 lb 6.4 oz (73.7 kg)    Physical Exam Vitals and nursing note reviewed.  Constitutional:      General: She is not in acute distress.    Appearance: Normal appearance. She is well-developed, well-groomed and overweight. She is ill-appearing (chronically ill). She is not toxic-appearing or diaphoretic.  HENT:     Head: Normocephalic and atraumatic.     Jaw: There is normal jaw occlusion.     Right Ear: Hearing normal.     Left Ear: Hearing normal.     Nose: Nose normal.     Mouth/Throat:     Lips: Pink.     Mouth: Mucous membranes are moist.     Pharynx: Oropharynx is clear. Uvula midline.  Eyes:     General: Lids are normal.     Extraocular Movements: Extraocular movements intact.     Conjunctiva/sclera: Conjunctivae normal.     Pupils: Pupils are equal, round, and reactive to light.  Neck:     Thyroid: No thyroid mass, thyromegaly or thyroid tenderness.     Vascular: No carotid bruit or JVD.     Trachea: Trachea and phonation normal.  Cardiovascular:     Rate and Rhythm: Normal rate and regular rhythm.     Chest Wall: PMI is not displaced.     Pulses: Normal pulses.     Heart sounds: Normal heart sounds. No murmur heard.    No friction rub. No gallop.  Pulmonary:     Effort: Pulmonary effort is normal.     Breath sounds: Normal breath sounds.  Abdominal:     General: There is no abdominal bruit.     Palpations: There is no  hepatomegaly or splenomegaly.  Musculoskeletal:  General: Normal range of motion.     Cervical back: Normal range of motion and neck supple.     Right lower leg: No edema.     Left lower leg: No edema.  Lymphadenopathy:     Cervical: No cervical adenopathy.  Skin:    General: Skin is warm and dry.     Capillary Refill: Capillary refill takes less than 2 seconds.     Coloration: Skin is not cyanotic, jaundiced or pale.     Findings: No rash.  Neurological:     General: No focal deficit present.     Mental Status: She is alert and oriented to person, place, and time.     Sensory: Sensation is intact.     Motor: Motor function is intact.     Coordination: Coordination is intact.     Gait: Gait abnormal (antalgic, using rolling walker).     Deep Tendon Reflexes: Reflexes are normal and symmetric.  Psychiatric:        Attention and Perception: Attention and perception normal.        Mood and Affect: Mood and affect normal.        Speech: Speech normal.        Behavior: Behavior normal. Behavior is cooperative.        Thought Content: Thought content normal.        Cognition and Memory: Cognition and memory normal.        Judgment: Judgment normal.     Results for orders placed or performed in visit on 05/30/22  CMP14+EGFR  Result Value Ref Range   Glucose 101 (H) 70 - 99 mg/dL   BUN 13 8 - 27 mg/dL   Creatinine, Ser 4.09 (H) 0.57 - 1.00 mg/dL   eGFR 51 (L) >81 XB/JYN/8.29   BUN/Creatinine Ratio 12 12 - 28   Sodium 140 134 - 144 mmol/L   Potassium 4.0 3.5 - 5.2 mmol/L   Chloride 99 96 - 106 mmol/L   CO2 23 20 - 29 mmol/L   Calcium 9.5 8.7 - 10.3 mg/dL   Total Protein 6.3 6.0 - 8.5 g/dL   Albumin 3.8 3.8 - 4.8 g/dL   Globulin, Total 2.5 1.5 - 4.5 g/dL   Albumin/Globulin Ratio 1.5 1.2 - 2.2   Bilirubin Total 0.4 0.0 - 1.2 mg/dL   Alkaline Phosphatase 63 44 - 121 IU/L   AST 20 0 - 40 IU/L   ALT 14 0 - 32 IU/L  CBC with Differential/Platelet  Result Value Ref Range    WBC 15.9 (H) 3.4 - 10.8 x10E3/uL   RBC 3.87 3.77 - 5.28 x10E6/uL   Hemoglobin 12.8 11.1 - 15.9 g/dL   Hematocrit 56.2 13.0 - 46.6 %   MCV 100 (H) 79 - 97 fL   MCH 33.1 (H) 26.6 - 33.0 pg   MCHC 33.1 31.5 - 35.7 g/dL   RDW 86.5 78.4 - 69.6 %   Platelets 264 150 - 450 x10E3/uL   Neutrophils 65 Not Estab. %   Lymphs 21 Not Estab. %   Monocytes 8 Not Estab. %   Eos 5 Not Estab. %   Basos 1 Not Estab. %   Neutrophils Absolute 10.4 (H) 1.4 - 7.0 x10E3/uL   Lymphocytes Absolute 3.3 (H) 0.7 - 3.1 x10E3/uL   Monocytes Absolute 1.2 (H) 0.1 - 0.9 x10E3/uL   EOS (ABSOLUTE) 0.8 (H) 0.0 - 0.4 x10E3/uL   Basophils Absolute 0.1 0.0 - 0.2 x10E3/uL   Immature Granulocytes 0 Not Estab. %  Immature Grans (Abs) 0.0 0.0 - 0.1 x10E3/uL  Thyroid Panel With TSH  Result Value Ref Range   TSH 1.370 0.450 - 4.500 uIU/mL   T4, Total 7.6 4.5 - 12.0 ug/dL   T3 Uptake Ratio 25 24 - 39 %   Free Thyroxine Index 1.9 1.2 - 4.9  Lipid panel  Result Value Ref Range   Cholesterol, Total 134 100 - 199 mg/dL   Triglycerides 161 (H) 0 - 149 mg/dL   HDL 46 >09 mg/dL   VLDL Cholesterol Cal 36 5 - 40 mg/dL   LDL Chol Calc (NIH) 52 0 - 99 mg/dL   Chol/HDL Ratio 2.9 0.0 - 4.4 ratio  VITAMIN D 25 Hydroxy (Vit-D Deficiency, Fractures)  Result Value Ref Range   Vit D, 25-Hydroxy 31.9 30.0 - 100.0 ng/mL  Drug Screen 13 with reflex Confirmation (AMP,BAR,BZO,COC,PCP,THC,OPI,OXY,MD,FEN,MEP,PPX,TRAM), Serum  Result Value Ref Range   Amphetamines, IA Negative Cutoff:50 ng/mL   Barbiturates, IA Negative Cutoff:0.1 ug/mL   Benzodiazepines, IA Negative Cutoff:20 ng/mL   Cocaine & Metabolite, IA Negative Cutoff:25 ng/mL   Phencyclidine, IA Negative Cutoff:8 ng/mL   THC(Marijuana) Metabolite, IA Negative Cutoff:5 ng/mL   Opiates, IA Negative Cutoff:5 ng/mL   Oxycodones, IA Negative Cutoff:5 ng/mL   Methadone, IA Negative Cutoff:25 ng/mL   FENTANYL, IA Negative Cutoff:1.0 ng/mL   Propoxyphene, IA Negative Cutoff:50 ng/mL    MEPERIDINE, IA Negative Cutoff:100 ng/mL   TRAMADOL, IA ++POSITIVE++ (A) Cutoff:50 ng/mL  Tramadol,MS,WB/SP RFX  Result Value Ref Range   Tramadol Confirmation Positive    Tramadol PRESENT ng/mL   O-Desmethyltramadol 76.1 ng/mL       Pertinent labs & imaging results that were available during my care of the patient were reviewed by me and considered in my medical decision making.  Assessment & Plan:  Kadee was seen today for psoriasis and pain.  Diagnoses and all orders for this visit:  Primary osteoarthritis involving multiple joints Controlled substance agreement signed Doing well on below, will continue. Sedation precautions discussed. Side effects discussed. -     traMADol (ULTRAM) 50 MG tablet; Take 2 tablets (100 mg total) by mouth every 12 (twelve) hours. -     traMADol (ULTRAM) 50 MG tablet; Take 2 tablets (100 mg total) by mouth 2 (two) times daily. -     traMADol (ULTRAM) 50 MG tablet; Take 2 tablets (100 mg total) by mouth 2 (two) times daily.  Inverse psoriasis Has improved significantly. Will continue as needed Kenalog cream and daily emollients.  -     triamcinolone (KENALOG) 0.025 % ointment; APPLY 1 APPLICATION TOPICALLY 2 TIMES A DAY.     Continue all other maintenance medications.  Follow up plan: Return in about 3 months (around 12/18/2022).   Continue healthy lifestyle choices, including diet (rich in fruits, vegetables, and lean proteins, and low in salt and simple carbohydrates) and exercise (at least 30 minutes of moderate physical activity daily).  Educational handout given for tramadol  The above assessment and management plan was discussed with the patient. The patient verbalized understanding of and has agreed to the management plan. Patient is aware to call the clinic if they develop any new symptoms or if symptoms persist or worsen. Patient is aware when to return to the clinic for a follow-up visit. Patient educated on when it is appropriate to go  to the emergency department.   Kari Baars, FNP-C [facility] [phone]

## 2022-11-07 ENCOUNTER — Ambulatory Visit (INDEPENDENT_AMBULATORY_CARE_PROVIDER_SITE_OTHER): Payer: Medicare HMO

## 2022-11-07 VITALS — Ht 64.0 in | Wt 157.0 lb

## 2022-11-07 DIAGNOSIS — Z122 Encounter for screening for malignant neoplasm of respiratory organs: Secondary | ICD-10-CM

## 2022-11-07 DIAGNOSIS — Z0000 Encounter for general adult medical examination without abnormal findings: Secondary | ICD-10-CM | POA: Diagnosis not present

## 2022-11-07 NOTE — Patient Instructions (Signed)
Denise Underwood , Thank you for taking time to come for your Medicare Wellness Visit. I appreciate your ongoing commitment to your health goals. Please review the following plan we discussed and let me know if I can assist you in the future.   These are the goals we discussed:  Goals      DIET - INCREASE LEAN PROTEINS     Prevent falls        This is a list of the screening recommended for you and due dates:  Health Maintenance  Topic Date Due   Screening for Lung Cancer  Never done   COVID-19 Vaccine (3 - Moderna risk series) 03/14/2020   Flu Shot  12/12/2022   Medicare Annual Wellness Visit  11/07/2023   DEXA scan (bone density measurement)  07/04/2024   DTaP/Tdap/Td vaccine (2 - Td or Tdap) 12/28/2030   Pneumonia Vaccine  Completed   Hepatitis C Screening  Completed   Zoster (Shingles) Vaccine  Completed   HPV Vaccine  Aged Out   Colon Cancer Screening  Discontinued    Advanced directives: In Chart   Conditions/risks identified: Aim for 30 minutes of exercise or brisk walking, 6-8 glasses of water, and 5 servings of fruits and vegetables each day.   Next appointment: Follow up in one year for your annual wellness visit    Preventive Care 65 Years and Older, Female Preventive care refers to lifestyle choices and visits with your health care provider that can promote health and wellness. What does preventive care include? A yearly physical exam. This is also called an annual well check. Dental exams once or twice a year. Routine eye exams. Ask your health care provider how often you should have your eyes checked. Personal lifestyle choices, including: Daily care of your teeth and gums. Regular physical activity. Eating a healthy diet. Avoiding tobacco and drug use. Limiting alcohol use. Practicing safe sex. Taking low-dose aspirin every day. Taking vitamin and mineral supplements as recommended by your health care provider. What happens during an annual well check? The  services and screenings done by your health care provider during your annual well check will depend on your age, overall health, lifestyle risk factors, and family history of disease. Counseling  Your health care provider may ask you questions about your: Alcohol use. Tobacco use. Drug use. Emotional well-being. Home and relationship well-being. Sexual activity. Eating habits. History of falls. Memory and ability to understand (cognition). Work and work Astronomer. Reproductive health. Screening  You may have the following tests or measurements: Height, weight, and BMI. Blood pressure. Lipid and cholesterol levels. These may be checked every 5 years, or more frequently if you are over 31 years old. Skin check. Lung cancer screening. You may have this screening every year starting at age 69 if you have a 30-pack-year history of smoking and currently smoke or have quit within the past 15 years. Fecal occult blood test (FOBT) of the stool. You may have this test every year starting at age 45. Flexible sigmoidoscopy or colonoscopy. You may have a sigmoidoscopy every 5 years or a colonoscopy every 10 years starting at age 57. Hepatitis C blood test. Hepatitis B blood test. Sexually transmitted disease (STD) testing. Diabetes screening. This is done by checking your blood sugar (glucose) after you have not eaten for a while (fasting). You may have this done every 1-3 years. Bone density scan. This is done to screen for osteoporosis. You may have this done starting at age 35. Mammogram. This  may be done every 1-2 years. Talk to your health care provider about how often you should have regular mammograms. Talk with your health care provider about your test results, treatment options, and if necessary, the need for more tests. Vaccines  Your health care provider may recommend certain vaccines, such as: Influenza vaccine. This is recommended every year. Tetanus, diphtheria, and acellular  pertussis (Tdap, Td) vaccine. You may need a Td booster every 10 years. Zoster vaccine. You may need this after age 10. Pneumococcal 13-valent conjugate (PCV13) vaccine. One dose is recommended after age 25. Pneumococcal polysaccharide (PPSV23) vaccine. One dose is recommended after age 6. Talk to your health care provider about which screenings and vaccines you need and how often you need them. This information is not intended to replace advice given to you by your health care provider. Make sure you discuss any questions you have with your health care provider. Document Released: 05/26/2015 Document Revised: 01/17/2016 Document Reviewed: 02/28/2015 Elsevier Interactive Patient Education  2017 [place] Prevention in the Home Falls can cause injuries. They can happen to people of all ages. There are many things you can do to make your home safe and to help prevent falls. What can I do on the outside of my home? Regularly fix the edges of walkways and driveways and fix any cracks. Remove anything that might make you trip as you walk through a door, such as a raised step or threshold. Trim any bushes or trees on the path to your home. Use bright outdoor lighting. Clear any walking paths of anything that might make someone trip, such as rocks or tools. Regularly check to see if handrails are loose or broken. Make sure that both sides of any steps have handrails. Any raised decks and porches should have guardrails on the edges. Have any leaves, snow, or ice cleared regularly. Use sand or salt on walking paths during winter. Clean up any spills in your garage right away. This includes oil or grease spills. What can I do in the bathroom? Use night lights. Install grab bars by the toilet and in the tub and shower. Do not use towel bars as grab bars. Use non-skid mats or decals in the tub or shower. If you need to sit down in the shower, use a plastic, non-slip stool. Keep the floor  dry. Clean up any water that spills on the floor as soon as it happens. Remove soap buildup in the tub or shower regularly. Attach bath mats securely with double-sided non-slip rug tape. Do not have throw rugs and other things on the floor that can make you trip. What can I do in the bedroom? Use night lights. Make sure that you have a light by your bed that is easy to reach. Do not use any sheets or blankets that are too big for your bed. They should not hang down onto the floor. Have a firm chair that has side arms. You can use this for support while you get dressed. Do not have throw rugs and other things on the floor that can make you trip. What can I do in the kitchen? Clean up any spills right away. Avoid walking on wet floors. Keep items that you use a lot in easy-to-reach places. If you need to reach something above you, use a strong step stool that has a grab bar. Keep electrical cords out of the way. Do not use floor polish or wax that makes floors slippery. If you must  use wax, use non-skid floor wax. Do not have throw rugs and other things on the floor that can make you trip. What can I do with my stairs? Do not leave any items on the stairs. Make sure that there are handrails on both sides of the stairs and use them. Fix handrails that are broken or loose. Make sure that handrails are as long as the stairways. Check any carpeting to make sure that it is firmly attached to the stairs. Fix any carpet that is loose or worn. Avoid having throw rugs at the top or bottom of the stairs. If you do have throw rugs, attach them to the floor with carpet tape. Make sure that you have a light switch at the top of the stairs and the bottom of the stairs. If you do not have them, ask someone to add them for you. What else can I do to help prevent falls? Wear shoes that: Do not have high heels. Have rubber bottoms. Are comfortable and fit you well. Are closed at the toe. Do not wear  sandals. If you use a stepladder: Make sure that it is fully opened. Do not climb a closed stepladder. Make sure that both sides of the stepladder are locked into place. Ask someone to hold it for you, if possible. Clearly mark and make sure that you can see: Any grab bars or handrails. First and last steps. Where the edge of each step is. Use tools that help you move around (mobility aids) if they are needed. These include: Canes. Walkers. Scooters. Crutches. Turn on the lights when you go into a dark area. Replace any light bulbs as soon as they burn out. Set up your furniture so you have a clear path. Avoid moving your furniture around. If any of your floors are uneven, fix them. If there are any pets around you, be aware of where they are. Review your medicines with your doctor. Some medicines can make you feel dizzy. This can increase your chance of falling. Ask your doctor what other things that you can do to help prevent falls. This information is not intended to replace advice given to you by your health care provider. Make sure you discuss any questions you have with your health care provider. Document Released: 02/23/2009 Document Revised: 10/05/2015 Document Reviewed: 06/03/2014 Elsevier Interactive Patient Education  2017 [employer].

## 2022-11-07 NOTE — Progress Notes (Signed)
Subjective:   Denise Underwood is a 77 y.o. female who presents for Medicare Annual (Subsequent) preventive examination.  Visit Complete: Virtual  I connected with  Denise Underwood on 11/07/22 by a audio enabled telemedicine application and verified that I am speaking with the correct person using two identifiers.  Patient Location: Home  Provider Location: Home Office  I discussed the limitations of evaluation and management by telemedicine. The patient expressed understanding and agreed to proceed.  Patient Medicare AWV questionnaire was completed by the patient on 11/07/2022; I have confirmed that all information answered by patient is correct and no changes since this date.  Review of Systems     Cardiac Risk Factors include: advanced age (>85men, >75 women);smoking/ tobacco exposure;dyslipidemia;diabetes mellitus;hypertension     Objective:    Today's Vitals   11/07/22 1322  Weight: 157 lb (71.2 kg)  Height: 5\' 4"  (1.626 m)   Body mass index is 26.95 kg/m.     11/07/2022    1:26 PM 11/05/2021    1:33 PM 11/02/2019   12:01 PM 10/02/2018    3:51 PM 05/08/2018    4:46 PM 04/21/2018    1:39 PM 04/16/2018   12:33 PM  Advanced Directives  Does Patient Have a Medical Advance Directive? Yes Yes No No No No Yes  Type of Estate agent of [place];Living will Healthcare Power of [place];Living will       Does patient want to make changes to medical advance directive? No - Patient declined     No - Patient declined No - Patient declined  Copy of Healthcare Power of Attorney in Chart? Yes - validated most recent copy scanned in chart (See row information) Yes - validated most recent copy scanned in chart (See row information)       Would patient like information on creating a medical advance directive?   No - Patient declined No - Patient declined   No - Patient declined    Current Medications (verified) Outpatient Encounter Medications as of 11/07/2022   Medication Sig   albuterol (VENTOLIN HFA) 108 (90 Base) MCG/ACT inhaler Inhale 2 puffs into the lungs every 6 (six) hours as needed.   CALCIUM PO Take by mouth daily.   Cholecalciferol (VITAMIN D3) 1000 units CAPS Take by mouth.   diclofenac Sodium (VOLTAREN) 1 % GEL Apply 2 g topically 4 (four) times daily.   Emollient (CERAVE DIABETICS DRY SKIN) CREA Apply 1 Application topically 2 (two) times daily.   escitalopram (LEXAPRO) 20 MG tablet Take 1 tablet (20 mg total) by mouth daily.   fluticasone (FLONASE) 50 MCG/ACT nasal spray Place 2 sprays into both nostrils daily.   hydrochlorothiazide (HYDRODIURIL) 25 MG tablet Take 1 tablet (25 mg total) by mouth daily as needed.   hydrocortisone 2.5 % cream Apply topically 2 (two) times daily.   meloxicam (MOBIC) 7.5 MG tablet TAKE 1 TABLET BY MOUTH ONCE DAILY.   naloxone (NARCAN) nasal spray 4 mg/0.1 mL As needed or respiratory depression or decreased mental status related to narcotic use / overdose   nystatin (MYCOSTATIN/NYSTOP) powder Apply 1 application. topically 3 (three) times daily.   rosuvastatin (CRESTOR) 20 MG tablet Take 1 tablet (20 mg total) by mouth daily.   traMADol (ULTRAM) 50 MG tablet Take 2 tablets (100 mg total) by mouth every 12 (twelve) hours.   [START ON 11/16/2022] traMADol (ULTRAM) 50 MG tablet Take 2 tablets (100 mg total) by mouth 2 (two) times daily.   traMADol Janean Sark)  50 MG tablet Take 2 tablets (100 mg total) by mouth 2 (two) times daily.   traZODone (DESYREL) 50 MG tablet Take 1 tablet (50 mg total) by mouth at bedtime.   triamcinolone (KENALOG) 0.025 % ointment APPLY 1 APPLICATION TOPICALLY 2 TIMES A DAY.   No facility-administered encounter medications on file as of 11/07/2022.    Allergies (verified) Patient has no known allergies.   History: Past Medical History:  Diagnosis Date   Anxiety    Arthritis    Hyperlipidemia    Hypertension    Osteopenia 07/04/2021   Prediabetes 07/09/2021   Psoriasis    Past  Surgical History:  Procedure Laterality Date   ABDOMINAL HYSTERECTOMY     CHOLECYSTECTOMY     HERNIA REPAIR     KNEE SURGERY     TUBAL LIGATION     History reviewed. No pertinent family history. Social History   Socioeconomic History   Marital status: Single    Spouse name: Not on file   Number of children: 2   Years of education: Not on file   Highest education level: 9th grade  Occupational History   Occupation: Retired  Tobacco Use   Smoking status: Every Day    Packs/day: 0.50    Years: 40.00    Additional pack years: 0.00    Total pack years: 20.00    Types: Cigarettes   Smokeless tobacco: Never   Tobacco comments:    Smoking since she was 28 or younger  Vaping Use   Vaping Use: Never used  Substance and Sexual Activity   Alcohol use: No   Drug use: No   Sexual activity: Not Currently    Birth control/protection: Surgical  Other Topics Concern   Not on file  Social History Narrative   She lives alone on ground level apartment - has children.    Daughter, Denise Underwood is her main caregiver and means for transportation.   She also has an aide that comes in for 2 hours on weekdays to help with bathing, meals, meds etc   Social Determinants of Health   Financial Resource Strain: Low Risk  (11/07/2022)   Overall Financial Resource Strain (CARDIA)    Difficulty of Paying Living Expenses: Not hard at all  Food Insecurity: No Food Insecurity (11/07/2022)   Hunger Vital Sign    Worried About Running Out of Food in the Last Year: Never true    Ran Out of Food in the Last Year: Never true  Transportation Needs: No Transportation Needs (11/07/2022)   PRAPARE - Administrator, Civil Service (Medical): No    Lack of Transportation (Non-Medical): No  Physical Activity: Insufficiently Active (11/07/2022)   Exercise Vital Sign    Days of Exercise per Week: 3 days    Minutes of Exercise per Session: 30 min  Stress: No Stress Concern Present (11/07/2022)   [employer] of Occupational Health - Occupational Stress Questionnaire    Feeling of Stress : Not at all  Social Connections: Socially Isolated (11/07/2022)   Social Connection and Isolation Panel [NHANES]    Frequency of Communication with Friends and Family: More than three times a week    Frequency of Social Gatherings with Friends and Family: More than three times a week    Attends Religious Services: Never    Database administrator or Organizations: No    Attends Banker Meetings: Never    Marital Status: Widowed    Tobacco Counseling Ready to  quit: No Counseling given: Not Answered Tobacco comments: Smoking since she was 29 or younger   Clinical Intake:  Pre-visit preparation completed: Yes  Pain : No/denies pain     Nutritional Risks: None Diabetes: No  How often do you need to have someone help you when you read instructions, pamphlets, or other written materials from your doctor or pharmacy?: 1 - Never  Interpreter Needed?: No  Information entered by :: Renie Ora, LPN   Activities of Daily Living    11/07/2022    1:26 PM  In your present state of health, do you have any difficulty performing the following activities:  Hearing? 0  Vision? 0  Difficulty concentrating or making decisions? 0  Walking or climbing stairs? 0  Dressing or bathing? 0  Doing errands, shopping? 0  Preparing Food and eating ? N  Using the Toilet? N  In the past six months, have you accidently leaked urine? N  Do you have problems with loss of bowel control? N  Managing your Medications? N  Managing your Finances? N  Housekeeping or managing your Housekeeping? N    Patient Care Team: Sonny Masters, FNP as PCP - General (Family Medicine) Magrinat, Valentino Hue, MD (Inactive) as Consulting Physician (Oncology) Toma Deiters, MD (Internal Medicine) Ubaldo Glassing Lebron Conners, MD as Consulting Physician (Internal Medicine)  Indicate any recent Medical Services you may have  received from other than [facility] providers in the past year (date may be approximate).     Assessment:   This is a routine wellness examination for [place].  Hearing/Vision screen Vision Screening - Comments:: Wears rx glasses - up to date with routine eye exams with  Dr.Johnson   Dietary issues and exercise activities discussed:     Goals Addressed             This Visit's Progress    DIET - INCREASE LEAN PROTEINS   On track      Depression Screen    11/07/2022    1:25 PM 09/17/2022    8:32 AM 03/28/2022   10:36 AM 01/03/2022   10:16 AM 11/05/2021    1:31 PM 10/02/2021    9:08 AM 07/04/2021    8:16 AM  PHQ 2/9 Scores  PHQ - 2 Score 0 3 2 3 2 3 5   PHQ- 9 Score 0 11 9 8 7 13 10     Fall Risk    11/07/2022    1:24 PM 09/17/2022    8:32 AM 03/28/2022   10:36 AM 01/03/2022   10:15 AM 11/05/2021    1:29 PM  Fall Risk   Falls in the past year? 0 1 0 1 1  Number falls in past yr: 0 1  1 1   Injury with Fall? 0 0  0 0  Risk for fall due to : No Fall Risks    History of fall(s);Impaired balance/gait;Orthopedic patient  Follow up Falls prevention discussed Falls prevention discussed  Falls prevention discussed Education provided;Falls prevention discussed    MEDICARE RISK AT HOME:  Medicare Risk at Home - 11/07/22 1324     Any stairs in or around the home? No    If so, are there any without handrails? No    Home free of loose throw rugs in walkways, pet beds, electrical cords, etc? Yes    Adequate lighting in your home to reduce risk of falls? Yes    Life alert? No    Use of a cane, walker or w/c? No  Grab bars in the bathroom? Yes    Shower chair or bench in shower? Yes    Elevated toilet seat or a handicapped toilet? Yes             TIMED UP AND GO:  Was the test performed?  No    Cognitive Function:        11/07/2022    1:26 PM 11/05/2021    1:34 PM 11/02/2019   12:03 PM 10/02/2018    3:58 PM  6CIT Screen  What Year? 0 points 0 points 0 points 0 points   What month? 0 points 0 points 0 points 0 points  What time? 0 points 0 points 0 points 0 points  Count back from 20 0 points 4 points 0 points 0 points  Months in reverse 0 points 4 points 4 points 4 points  Repeat phrase 0 points 4 points 2 points 2 points  Total Score 0 points 12 points 6 points 6 points    Immunizations Immunization History  Administered Date(s) Administered   Fluad Quad(high Dose 65+) 03/01/2019, 03/01/2020, 04/03/2021, 03/28/2022   Influenza Inj Mdck Quad Pf 02/26/2017   Influenza, High Dose Seasonal PF 02/17/2018   Moderna Sars-Covid-2 Vaccination 01/18/2020, 02/15/2020   Pneumococcal Conjugate-13 05/20/2018   Pneumococcal Polysaccharide-23 03/01/2019   Tdap 12/27/2020   Zoster Recombinat (Shingrix) 12/27/2020, 04/03/2021    TDAP status: Up to date  Flu Vaccine status: Up to date  Pneumococcal vaccine status: Up to date  Covid-19 vaccine status: Completed vaccines  Qualifies for Shingles Vaccine? Yes   Zostavax completed Yes   Shingrix Completed?: Yes  Screening Tests Health Maintenance  Topic Date Due   Lung Cancer Screening  Never done   COVID-19 Vaccine (3 - Moderna risk series) 03/14/2020   INFLUENZA VACCINE  12/12/2022   Medicare Annual Wellness (AWV)  11/07/2023   DEXA SCAN  07/04/2024   DTaP/Tdap/Td (2 - Td or Tdap) 12/28/2030   Pneumonia Vaccine 61+ Years old  Completed   Hepatitis C Screening  Completed   Zoster Vaccines- Shingrix  Completed   HPV VACCINES  Aged Out   Colonoscopy  Discontinued    Health Maintenance  Health Maintenance Due  Topic Date Due   Lung Cancer Screening  Never done   COVID-19 Vaccine (3 - Moderna risk series) 03/14/2020    Colorectal cancer screening: No longer required.   Mammogram status: No longer required due to age.  Bone Density status: Completed 07/04/2021. Results reflect: Bone density results: OSTEOPOROSIS. Repeat every 2 years.  Lung Cancer Screening: (Low Dose CT Chest recommended if  Age 70-80 years, 20 pack-year currently smoking OR have quit w/in 15years.) does not qualify.   Lung Cancer Screening Referral: n/a  Additional Screening:  Hepatitis C Screening: does not qualify; Completed 07/04/2021  Vision Screening: Recommended annual ophthalmology exams for early detection of glaucoma and other disorders of the eye. Is the patient up to date with their annual eye exam?  Yes  Who is the provider or what is the name of the office in which the patient attends annual eye exams? Dr.Johnson  If pt is not established with a provider, would they like to be referred to a provider to establish care? No .   Dental Screening: Recommended annual dental exams for proper oral hygiene  Diabetic Foot Exam: Diabetic Foot Exam: Overdue, Pt has been advised about the importance in completing this exam. Pt is scheduled for diabetic foot exam on next office visit .  Community  Resource Referral / Chronic Care Management: CRR required this visit?  No   CCM required this visit?  No     Plan:     I have personally reviewed and noted the following in the patient's chart:   Medical and social history Use of alcohol, tobacco or illicit drugs  Current medications and supplements including opioid prescriptions. Patient is not currently taking opioid prescriptions. Functional ability and status Nutritional status Physical activity Advanced directives List of other physicians Hospitalizations, surgeries, and ER visits in previous 12 months Vitals Screenings to include cognitive, depression, and falls Referrals and appointments  In addition, I have reviewed and discussed with patient certain preventive protocols, quality metrics, and best practice recommendations. A written personalized care plan for preventive services as well as general preventive health recommendations were provided to patient.     Lorrene Reid, LPN   12/21/9145   After Visit Summary: (MyChart) Due to this  being a telephonic visit, the after visit summary with patients personalized plan was offered to patient via MyChart   Nurse Notes: none

## 2022-11-19 ENCOUNTER — Encounter: Payer: Self-pay | Admitting: Family Medicine

## 2022-11-19 ENCOUNTER — Ambulatory Visit (INDEPENDENT_AMBULATORY_CARE_PROVIDER_SITE_OTHER): Payer: Medicare HMO | Admitting: Family Medicine

## 2022-11-19 VITALS — BP 148/79 | HR 101 | Temp 98.1°F | Ht 64.0 in | Wt 158.6 lb

## 2022-11-19 DIAGNOSIS — M150 Primary generalized (osteo)arthritis: Secondary | ICD-10-CM

## 2022-11-19 DIAGNOSIS — I10 Essential (primary) hypertension: Secondary | ICD-10-CM

## 2022-11-19 DIAGNOSIS — J301 Allergic rhinitis due to pollen: Secondary | ICD-10-CM | POA: Diagnosis not present

## 2022-11-19 DIAGNOSIS — M159 Polyosteoarthritis, unspecified: Secondary | ICD-10-CM

## 2022-11-19 LAB — BMP8+EGFR
BUN/Creatinine Ratio: 13 (ref 12–28)
BUN: 12 mg/dL (ref 8–27)
CO2: 26 mmol/L (ref 20–29)
Calcium: 10 mg/dL (ref 8.7–10.3)
Chloride: 101 mmol/L (ref 96–106)
Creatinine, Ser: 0.9 mg/dL (ref 0.57–1.00)
Glucose: 107 mg/dL — ABNORMAL HIGH (ref 70–99)
Potassium: 4.2 mmol/L (ref 3.5–5.2)
Sodium: 143 mmol/L (ref 134–144)
eGFR: 66 mL/min/{1.73_m2} (ref >59)

## 2022-11-19 MED ORDER — LISINOPRIL 5 MG PO TABS
5.00 mg | ORAL_TABLET | Freq: Every day | ORAL | 3 refills | Status: AC
Start: 2022-11-19 — End: ?

## 2022-11-19 MED ORDER — MELOXICAM 7.5 MG PO TABS
7.50 mg | ORAL_TABLET | Freq: Every day | ORAL | 2 refills | Status: AC
Start: 2022-11-19 — End: ?

## 2022-11-19 MED ORDER — LEVOCETIRIZINE DIHYDROCHLORIDE 5 MG PO TABS
5.0000 mg | ORAL_TABLET | Freq: Every evening | ORAL | 1 refills | Status: AC
Start: 2022-11-19 — End: ?

## 2022-11-19 NOTE — Progress Notes (Signed)
Subjective:  Patient ID: Denise Underwood, female    DOB: May 10, 1946, 77 y.o.   MRN: [id number]  Patient Care Team: Sonny Masters, FNP as PCP - General (Family Medicine) Magrinat, Valentino Hue, MD (Inactive) as Consulting Physician (Oncology) Toma Deiters, MD (Internal Medicine) Ubaldo Glassing Lebron Conners, MD as Consulting Physician (Internal Medicine)   Chief Complaint:  Arthritis, Nasal Congestion, Cough, and sneezing (X 3 days - no otc medications )   HPI: Denise Underwood is a 77 y.o. female presenting on 11/19/2022 for Arthritis, Nasal Congestion, Cough, and sneezing (X 3 days - no otc medications )   Arthritis Presents for follow-up visit. She complains of pain, stiffness and joint swelling. Associated symptoms include pain at night and pain while resting. Pertinent negatives include no diarrhea, dry eyes, dry mouth, dysuria, fatigue, fever, rash, Raynaud's syndrome, uveitis or weight loss.  Cough This is a new problem. The problem has been waxing and waning. The cough is Non-productive. Associated symptoms include nasal congestion, postnasal drip and rhinorrhea. Pertinent negatives include no chest pain, chills, ear congestion, ear pain, fever, headaches, heartburn, hemoptysis, myalgias, rash, sore throat, shortness of breath, sweats, weight loss or wheezing. Nothing aggravates the symptoms. She has tried nothing for the symptoms.  Hypertension This is a chronic problem. The problem has been waxing and waning since onset. The problem is uncontrolled. Pertinent negatives include no anxiety, blurred vision, chest pain, headaches, malaise/fatigue, neck pain, orthopnea, palpitations, peripheral edema, PND, shortness of breath or sweats. Agents associated with hypertension include NSAIDs. Risk factors for coronary artery disease include dyslipidemia, obesity, post-menopausal state and sedentary lifestyle. Past treatments include diuretics. The current treatment provides mild improvement. Compliance  problems include exercise and diet.      Relevant past medical, surgical, family, and social history reviewed and updated as indicated.  Allergies and medications reviewed and updated. Data reviewed: Chart in Epic.   Past Medical History:  Diagnosis Date   Anxiety    Arthritis    Hyperlipidemia    Hypertension    Osteopenia 07/04/2021   Prediabetes 07/09/2021   Psoriasis     Past Surgical History:  Procedure Laterality Date   ABDOMINAL HYSTERECTOMY     CHOLECYSTECTOMY     HERNIA REPAIR     KNEE SURGERY     TUBAL LIGATION      Social History   Socioeconomic History   Marital status: Single    Spouse name: Not on file   Number of children: 2   Years of education: Not on file   Highest education level: 9th grade  Occupational History   Occupation: Retired  Tobacco Use   Smoking status: Every Day    Packs/day: 0.50    Years: 40.00    Additional pack years: 0.00    Total pack years: 20.00    Types: Cigarettes   Smokeless tobacco: Never   Tobacco comments:    Smoking since she was 27 or younger  Vaping Use   Vaping Use: Never used  Substance and Sexual Activity   Alcohol use: No   Drug use: No   Sexual activity: Not Currently    Birth control/protection: Surgical  Other Topics Concern   Not on file  Social History Narrative   She lives alone on ground level apartment - has children.    Daughter, [name] is her main caregiver and means for transportation.   She also has an aide that comes in for 2 hours on weekdays to help  with bathing, meals, meds etc   Social Determinants of Health   Financial Resource Strain: Low Risk  (11/07/2022)   Overall Financial Resource Strain (CARDIA)    Difficulty of Paying Living Expenses: Not hard at all  Food Insecurity: No Food Insecurity (11/07/2022)   Hunger Vital Sign    Worried About Running Out of Food in the Last Year: Never true    Ran Out of Food in the Last Year: Never true  Transportation Needs: No Transportation  Needs (11/07/2022)   PRAPARE - Administrator, Civil Service (Medical): No    Lack of Transportation (Non-Medical): No  Physical Activity: Insufficiently Active (11/07/2022)   Exercise Vital Sign    Days of Exercise per Week: 3 days    Minutes of Exercise per Session: 30 min  Stress: No Stress Concern Present (11/07/2022)   [employer] of Occupational Health - Occupational Stress Questionnaire    Feeling of Stress : Not at all  Social Connections: Socially Isolated (11/07/2022)   Social Connection and Isolation Panel [NHANES]    Frequency of Communication with Friends and Family: More than three times a week    Frequency of Social Gatherings with Friends and Family: More than three times a week    Attends Religious Services: Never    Database administrator or Organizations: No    Attends Banker Meetings: Never    Marital Status: Widowed  Intimate Partner Violence: Not At Risk (11/07/2022)   Humiliation, Afraid, Rape, and Kick questionnaire    Fear of Current or Ex-Partner: No    Emotionally Abused: No    Physically Abused: No    Sexually Abused: No    Outpatient Encounter Medications as of 11/19/2022  Medication Sig   albuterol (VENTOLIN HFA) 108 (90 Base) MCG/ACT inhaler Inhale 2 puffs into the lungs every 6 (six) hours as needed.   CALCIUM PO Take by mouth daily.   Cholecalciferol (VITAMIN D3) 1000 units CAPS Take by mouth.   diclofenac Sodium (VOLTAREN) 1 % GEL Apply 2 g topically 4 (four) times daily.   Emollient (CERAVE DIABETICS DRY SKIN) CREA Apply 1 Application topically 2 (two) times daily.   escitalopram (LEXAPRO) 20 MG tablet Take 1 tablet (20 mg total) by mouth daily.   fluticasone (FLONASE) 50 MCG/ACT nasal spray Place 2 sprays into both nostrils daily.   hydrochlorothiazide (HYDRODIURIL) 25 MG tablet Take 1 tablet (25 mg total) by mouth daily as needed.   hydrocortisone 2.5 % cream Apply topically 2 (two) times daily.   levocetirizine  (XYZAL) 5 MG tablet Take 1 tablet (5 mg total) by mouth every evening.   lisinopril (ZESTRIL) 5 MG tablet Take 1 tablet (5 mg total) by mouth daily.   naloxone (NARCAN) nasal spray 4 mg/0.1 mL As needed or respiratory depression or decreased mental status related to narcotic use / overdose   nystatin (MYCOSTATIN/NYSTOP) powder Apply 1 application. topically 3 (three) times daily.   rosuvastatin (CRESTOR) 20 MG tablet Take 1 tablet (20 mg total) by mouth daily.   traMADol (ULTRAM) 50 MG tablet Take 2 tablets (100 mg total) by mouth every 12 (twelve) hours.   traMADol (ULTRAM) 50 MG tablet Take 2 tablets (100 mg total) by mouth 2 (two) times daily.   traMADol (ULTRAM) 50 MG tablet Take 2 tablets (100 mg total) by mouth 2 (two) times daily.   traZODone (DESYREL) 50 MG tablet Take 1 tablet (50 mg total) by mouth at bedtime.   triamcinolone (KENALOG)  0.025 % ointment APPLY 1 APPLICATION TOPICALLY 2 TIMES A DAY.   [DISCONTINUED] meloxicam (MOBIC) 7.5 MG tablet TAKE 1 TABLET BY MOUTH ONCE DAILY.   meloxicam (MOBIC) 7.5 MG tablet Take 1 tablet (7.5 mg total) by mouth daily.   No facility-administered encounter medications on file as of 11/19/2022.    No Known Allergies  Review of Systems  Constitutional:  Negative for activity change, appetite change, chills, diaphoresis, fatigue, fever, malaise/fatigue, unexpected weight change and weight loss.  HENT:  Positive for congestion, postnasal drip, rhinorrhea and sneezing. Negative for ear pain and sore throat.   Eyes: Negative.  Negative for blurred vision, photophobia and visual disturbance.  Respiratory:  Positive for cough. Negative for hemoptysis, chest tightness, shortness of breath and wheezing.   Cardiovascular:  Negative for chest pain, palpitations, orthopnea, leg swelling and PND.  Gastrointestinal:  Negative for abdominal pain, blood in stool, constipation, diarrhea, heartburn, nausea and vomiting.  Endocrine: Negative.   Genitourinary:   Negative for decreased urine volume, difficulty urinating, dysuria, frequency and urgency.  Musculoskeletal:  Positive for arthralgias, arthritis, back pain, gait problem, joint swelling and stiffness. Negative for myalgias and neck pain.  Skin: Negative.  Negative for rash.  Allergic/Immunologic: Negative.   Neurological:  Negative for dizziness, tremors, seizures, syncope, facial asymmetry, speech difficulty, weakness, light-headedness, numbness and headaches.  Hematological: Negative.   Psychiatric/Behavioral:  Negative for confusion, hallucinations, sleep disturbance and suicidal ideas.   All other systems reviewed and are negative.       Objective:  BP (!) 148/79   Pulse (!) 101   Temp 98.1 F (36.7 C) (Temporal)   Ht 5\' 4"  (1.626 m)   Wt 158 lb 9.6 oz (71.9 kg)   SpO2 90%   BMI 27.22 kg/m    Wt Readings from Last 3 Encounters:  11/19/22 158 lb 9.6 oz (71.9 kg)  11/07/22 157 lb (71.2 kg)  09/17/22 157 lb 9.6 oz (71.5 kg)    Physical Exam Vitals and nursing note reviewed.  Constitutional:      General: She is not in acute distress.    Appearance: Normal appearance. She is well-developed and well-groomed. She is obese. She is ill-appearing (chronically ill). She is not toxic-appearing or diaphoretic.  HENT:     Head: Normocephalic and atraumatic.     Jaw: There is normal jaw occlusion.     Right Ear: Hearing, tympanic membrane, ear canal and external ear normal.     Left Ear: Hearing, tympanic membrane, ear canal and external ear normal.     Nose: Rhinorrhea present. Rhinorrhea is clear.     Right Turbinates: Enlarged and pale.     Left Turbinates: Enlarged and pale.     Right Sinus: No maxillary sinus tenderness or frontal sinus tenderness.     Left Sinus: No maxillary sinus tenderness or frontal sinus tenderness.     Mouth/Throat:     Lips: Pink.     Mouth: Mucous membranes are moist.     Pharynx: Oropharynx is clear. Uvula midline.     Comments: Posterior  oropharynx cobblestoning Eyes:     General: Lids are normal.     Conjunctiva/sclera: Conjunctivae normal.     Pupils: Pupils are equal, round, and reactive to light.  Neck:     Thyroid: No thyroid mass, thyromegaly or thyroid tenderness.     Vascular: No carotid bruit or JVD.     Trachea: Trachea and phonation normal.  Cardiovascular:     Rate and Rhythm: Normal  rate and regular rhythm.     Chest Wall: PMI is not displaced.     Pulses: Normal pulses.     Heart sounds: Normal heart sounds. No murmur heard.    No friction rub. No gallop.  Pulmonary:     Effort: Pulmonary effort is normal. No respiratory distress.     Breath sounds: Normal breath sounds. No wheezing.  Abdominal:     General: There is no abdominal bruit.     Palpations: There is no hepatomegaly or splenomegaly.  Musculoskeletal:        General: Normal range of motion.     Cervical back: Normal range of motion and neck supple.     Right lower leg: No edema.     Left lower leg: No edema.  Lymphadenopathy:     Cervical: No cervical adenopathy.  Skin:    General: Skin is warm and dry.     Capillary Refill: Capillary refill takes less than 2 seconds.     Coloration: Skin is not cyanotic, jaundiced or pale.     Findings: No rash.  Neurological:     General: No focal deficit present.     Mental Status: She is alert and oriented to person, place, and time.     Sensory: Sensation is intact.     Motor: Motor function is intact.     Coordination: Coordination is intact.     Gait: Gait abnormal (antalgic, slow, using rolling walker).     Deep Tendon Reflexes: Reflexes are normal and symmetric.  Psychiatric:        Attention and Perception: Attention and perception normal.        Mood and Affect: Mood and affect normal.        Speech: Speech normal.        Behavior: Behavior normal. Behavior is cooperative.        Thought Content: Thought content normal.        Cognition and Memory: Cognition and memory normal.         Judgment: Judgment normal.     Results for orders placed or performed in visit on 05/30/22  CMP14+EGFR  Result Value Ref Range   Glucose 101 (H) 70 - 99 mg/dL   BUN 13 8 - 27 mg/dL   Creatinine, Ser 1.61 (H) 0.57 - 1.00 mg/dL   eGFR 51 (L) >09 UE/AVW/0.98   BUN/Creatinine Ratio 12 12 - 28   Sodium 140 134 - 144 mmol/L   Potassium 4.0 3.5 - 5.2 mmol/L   Chloride 99 96 - 106 mmol/L   CO2 23 20 - 29 mmol/L   Calcium 9.5 8.7 - 10.3 mg/dL   Total Protein 6.3 6.0 - 8.5 g/dL   Albumin 3.8 3.8 - 4.8 g/dL   Globulin, Total 2.5 1.5 - 4.5 g/dL   Albumin/Globulin Ratio 1.5 1.2 - 2.2   Bilirubin Total 0.4 0.0 - 1.2 mg/dL   Alkaline Phosphatase 63 44 - 121 IU/L   AST 20 0 - 40 IU/L   ALT 14 0 - 32 IU/L  CBC with Differential/Platelet  Result Value Ref Range   WBC 15.9 (H) 3.4 - 10.8 x10E3/uL   RBC 3.87 3.77 - 5.28 x10E6/uL   Hemoglobin 12.8 11.1 - 15.9 g/dL   Hematocrit 11.9 14.7 - 46.6 %   MCV 100 (H) 79 - 97 fL   MCH 33.1 (H) 26.6 - 33.0 pg   MCHC 33.1 31.5 - 35.7 g/dL   RDW 82.9 56.2 - 13.0 %  Platelets 264 150 - 450 x10E3/uL   Neutrophils 65 Not Estab. %   Lymphs 21 Not Estab. %   Monocytes 8 Not Estab. %   Eos 5 Not Estab. %   Basos 1 Not Estab. %   Neutrophils Absolute 10.4 (H) 1.4 - 7.0 x10E3/uL   Lymphocytes Absolute 3.3 (H) 0.7 - 3.1 x10E3/uL   Monocytes Absolute 1.2 (H) 0.1 - 0.9 x10E3/uL   EOS (ABSOLUTE) 0.8 (H) 0.0 - 0.4 x10E3/uL   Basophils Absolute 0.1 0.0 - 0.2 x10E3/uL   Immature Granulocytes 0 Not Estab. %   Immature Grans (Abs) 0.0 0.0 - 0.1 x10E3/uL  Thyroid Panel With TSH  Result Value Ref Range   TSH 1.370 0.450 - 4.500 uIU/mL   T4, Total 7.6 4.5 - 12.0 ug/dL   T3 Uptake Ratio 25 24 - 39 %   Free Thyroxine Index 1.9 1.2 - 4.9  Lipid panel  Result Value Ref Range   Cholesterol, Total 134 100 - 199 mg/dL   Triglycerides 161 (H) 0 - 149 mg/dL   HDL 46 >09 mg/dL   VLDL Cholesterol Cal 36 5 - 40 mg/dL   LDL Chol Calc (NIH) 52 0 - 99 mg/dL   Chol/HDL  Ratio 2.9 0.0 - 4.4 ratio  VITAMIN D 25 Hydroxy (Vit-D Deficiency, Fractures)  Result Value Ref Range   Vit D, 25-Hydroxy 31.9 30.0 - 100.0 ng/mL  Drug Screen 13 with reflex Confirmation (AMP,BAR,BZO,COC,PCP,THC,OPI,OXY,MD,FEN,MEP,PPX,TRAM), Serum  Result Value Ref Range   Amphetamines, IA Negative Cutoff:50 ng/mL   Barbiturates, IA Negative Cutoff:0.1 ug/mL   Benzodiazepines, IA Negative Cutoff:20 ng/mL   Cocaine & Metabolite, IA Negative Cutoff:25 ng/mL   Phencyclidine, IA Negative Cutoff:8 ng/mL   THC(Marijuana) Metabolite, IA Negative Cutoff:5 ng/mL   Opiates, IA Negative Cutoff:5 ng/mL   Oxycodones, IA Negative Cutoff:5 ng/mL   Methadone, IA Negative Cutoff:25 ng/mL   FENTANYL, IA Negative Cutoff:1.0 ng/mL   Propoxyphene, IA Negative Cutoff:50 ng/mL   MEPERIDINE, IA Negative Cutoff:100 ng/mL   TRAMADOL, IA ++POSITIVE++ (A) Cutoff:50 ng/mL  Tramadol,MS,WB/SP RFX  Result Value Ref Range   Tramadol Confirmation Positive    Tramadol PRESENT ng/mL   O-Desmethyltramadol 76.1 ng/mL       Pertinent labs & imaging results that were available during my care of the patient were reviewed by me and considered in my medical decision making.  Assessment & Plan:  Zannah was seen today for arthritis, nasal congestion, cough and sneezing.  Diagnoses and all orders for this visit:  Primary osteoarthritis involving multiple joints Doing well with below, will repeat renal function today and stop medications if warranted.  -     meloxicam (MOBIC) 7.5 MG tablet; Take 1 tablet (7.5 mg total) by mouth daily.  Primary hypertension Not controlled, will check BMP and start lisinopril today. DASH diet and exercise encouraged.  -     BMP8+EGFR -     lisinopril (ZESTRIL) 5 MG tablet; Take 1 tablet (5 mg total) by mouth daily.  Seasonal allergic rhinitis due to pollen Will start below. Aware to report new, worsening, or persistent symptoms.  -     levocetirizine (XYZAL) 5 MG tablet; Take 1 tablet  (5 mg total) by mouth every evening.     Continue all other maintenance medications.  Follow up plan: Return in about 3 weeks (around 12/10/2022) for HTN, renal function.   Continue healthy lifestyle choices, including diet (rich in fruits, vegetables, and lean proteins, and low in salt and simple carbohydrates) and exercise (at  least 30 minutes of moderate physical activity daily).  Educational handout given for arthritis  The above assessment and management plan was discussed with the patient. The patient verbalized understanding of and has agreed to the management plan. Patient is aware to call the clinic if they develop any new symptoms or if symptoms persist or worsen. Patient is aware when to return to the clinic for a follow-up visit. Patient educated on when it is appropriate to go to the emergency department.   Kari Baars, FNP-C [facility] [phone]

## 2022-12-17 ENCOUNTER — Other Ambulatory Visit: Payer: Self-pay | Admitting: Family Medicine

## 2022-12-17 DIAGNOSIS — G479 Sleep disorder, unspecified: Secondary | ICD-10-CM

## 2022-12-17 DIAGNOSIS — F331 Major depressive disorder, recurrent, moderate: Secondary | ICD-10-CM

## 2022-12-18 ENCOUNTER — Other Ambulatory Visit: Payer: Self-pay | Admitting: Family Medicine

## 2022-12-18 ENCOUNTER — Telehealth: Payer: Self-pay | Admitting: Family Medicine

## 2022-12-18 DIAGNOSIS — E7841 Elevated Lipoprotein(a): Secondary | ICD-10-CM

## 2022-12-18 DIAGNOSIS — Z79899 Other long term (current) drug therapy: Secondary | ICD-10-CM

## 2022-12-18 DIAGNOSIS — M159 Polyosteoarthritis, unspecified: Secondary | ICD-10-CM

## 2022-12-18 NOTE — Telephone Encounter (Signed)
She can wait until next week if she would like.

## 2022-12-18 NOTE — Telephone Encounter (Signed)
Patient scheduled.

## 2022-12-19 ENCOUNTER — Ambulatory Visit: Payer: Medicare HMO | Admitting: Family Medicine

## 2022-12-25 ENCOUNTER — Encounter: Payer: Self-pay | Admitting: Family Medicine

## 2022-12-25 ENCOUNTER — Ambulatory Visit (INDEPENDENT_AMBULATORY_CARE_PROVIDER_SITE_OTHER): Payer: Medicare HMO | Admitting: Family Medicine

## 2022-12-25 VITALS — BP 142/78 | HR 73 | Temp 97.5°F | Ht 64.0 in | Wt 160.2 lb

## 2022-12-25 DIAGNOSIS — M25562 Pain in left knee: Secondary | ICD-10-CM

## 2022-12-25 DIAGNOSIS — M159 Polyosteoarthritis, unspecified: Secondary | ICD-10-CM | POA: Diagnosis not present

## 2022-12-25 DIAGNOSIS — E782 Mixed hyperlipidemia: Secondary | ICD-10-CM

## 2022-12-25 DIAGNOSIS — Z79899 Other long term (current) drug therapy: Secondary | ICD-10-CM

## 2022-12-25 DIAGNOSIS — D72829 Elevated white blood cell count, unspecified: Secondary | ICD-10-CM

## 2022-12-25 DIAGNOSIS — G8929 Other chronic pain: Secondary | ICD-10-CM

## 2022-12-25 DIAGNOSIS — I10 Essential (primary) hypertension: Secondary | ICD-10-CM | POA: Diagnosis not present

## 2022-12-25 DIAGNOSIS — E559 Vitamin D deficiency, unspecified: Secondary | ICD-10-CM | POA: Diagnosis not present

## 2022-12-25 DIAGNOSIS — M25561 Pain in right knee: Secondary | ICD-10-CM | POA: Diagnosis not present

## 2022-12-25 DIAGNOSIS — K921 Melena: Secondary | ICD-10-CM

## 2022-12-25 MED ORDER — METHYLPREDNISOLONE ACETATE 40 MG/ML IJ SUSP
40.0000 mg | Freq: Once | INTRAMUSCULAR | Status: AC
Start: 2022-12-25 — End: 2022-12-25
  Administered 2022-12-25: 60 mg via INTRAMUSCULAR

## 2022-12-25 MED ORDER — TRAMADOL HCL 50 MG PO TABS: 100.0000 mg | ORAL_TABLET | Freq: Two times a day (BID) | ORAL | 0 refills | Status: DC

## 2022-12-25 MED ORDER — TRAMADOL HCL 50 MG PO TABS
100.0000 mg | ORAL_TABLET | Freq: Two times a day (BID) | ORAL | 0 refills | Status: DC
Start: 2023-02-23 — End: 2023-02-04

## 2022-12-25 MED ORDER — TRAMADOL HCL 50 MG PO TABS
100.0000 mg | ORAL_TABLET | Freq: Two times a day (BID) | ORAL | 0 refills | Status: DC
Start: 2022-12-25 — End: 2023-02-04

## 2022-12-25 NOTE — Progress Notes (Signed)
Subjective:  Patient ID: Denise Underwood, female    DOB: 02-23-1946, 77 y.o.   MRN: [id number]  Patient Care Team: Sonny Masters, FNP as PCP - General (Family Medicine) Magrinat, Valentino Hue, MD (Inactive) as Consulting Physician (Oncology) Toma Deiters, MD (Internal Medicine) Ubaldo Glassing Lebron Conners, MD as Consulting Physician (Internal Medicine)   Chief Complaint:  Pain Management (3 month follow up ) and Melena (Black stool after changing vitamins x 1 month)   HPI: Denise DEPAEPE is a 77 y.o. female presenting on 12/25/2022 for Pain Management (3 month follow up ) and Melena (Black stool after changing vitamins x 1 month)    1. Primary osteoarthritis involving multiple joints 2. Chronic bilateral knee pain States over the last several weeks her knee pain has increased. She last had steroid joint injections 05/2022 and reports this worked for several months.   Pain assessment: Cause of pain- osteoarthritis  Pain location- knees, back, hands Pain on scale of 1-10- 8/10 most days Frequency- constantly What increases pain-any activity What makes pain Better-rest, medications Effects on ADL - significant at times Any change in general medical condition-no Current opioids rx- tramadol 100 mg twice daily # meds rx- 120 Effectiveness of current meds-good, decreases pain significantly Adverse reactions from pain meds-none Morphine equivalent- 20 MME/Day  Pill count performed-No Last drug screen - 05/30/2022 ( high risk q3m, moderate risk q65m, low risk yearly ) Urine drug screen today- No Was the [facility] reviewed- yes  If yes were their any concerning findings? - none   Overdose risk: low    06/22/2020   12:21 PM  Opioid Risk   Alcohol 0  Illegal Drugs 0  Rx Drugs 0  Alcohol 0  Illegal Drugs 0  Rx Drugs 0  Age between 16-45 years  0  History of Preadolescent Sexual Abuse 0  Psychological Disease 0  Depression 0  Opioid Risk Tool Scoring 0  Opioid Risk Interpretation  Low Risk    3. Vitamin D deficiency Pt is taking oral repletion therapy. Denies bone pain and tenderness, muscle weakness, fracture, and difficulty walking. Lab Results  Component Value Date   VD25OH 31.9 05/30/2022   VD25OH 37.6 05/01/2022   Lab Results  Component Value Date   CALCIUM 10.0 11/19/2022     4. Primary hypertension Complaint with meds - Yes Current Medications - lisinopril, HCTZ Checking BP at home - no Exercising Regularly - No Watching Salt intake - Yes Pertinent ROS:  Headache - No Fatigue - No Visual Disturbances - No Chest pain - No Dyspnea - No Palpitations - No LE edema - Yes They report good compliance with medications and can restate their regimen by memory. No medication side effects.  BP Readings from Last 3 Encounters:  12/25/22 (!) 142/78  11/19/22 (!) 148/79  09/17/22 125/82    5. Mixed hyperlipidemia Compliant with medications - Yes Current medications - rosuvastatin  Side effects from medications - No Diet - general Exercise - none  6. Black stool She reports since changing her multivitamin and magnesium supplements she has had dark colored stools. States they are black at times. No other associated symptoms. Denies abdominal pain, weakness, chest pain, palpitations, shortness of breath, fatigue, weight loss, fever, chills, night sweats, or dizziness.      Relevant past medical, surgical, family, and social history reviewed and updated as indicated.  Allergies and medications reviewed and updated. Data reviewed: Chart in Epic.   Past Medical History:  Diagnosis  Date   Anxiety    Arthritis    Hyperlipidemia    Hypertension    Osteopenia 07/04/2021   Prediabetes 07/09/2021   Psoriasis     Past Surgical History:  Procedure Laterality Date   ABDOMINAL HYSTERECTOMY     CHOLECYSTECTOMY     HERNIA REPAIR     KNEE SURGERY     TUBAL LIGATION      Social History   Socioeconomic History   Marital status: Single    Spouse  name: Not on file   Number of children: 2   Years of education: Not on file   Highest education level: 9th grade  Occupational History   Occupation: Retired  Tobacco Use   Smoking status: Every Day    Current packs/day: 0.50    Average packs/day: 0.5 packs/day for 40.0 years (20.0 ttl pk-yrs)    Types: Cigarettes   Smokeless tobacco: Never   Tobacco comments:    Smoking since she was 29 or younger  Vaping Use   Vaping status: Never Used  Substance and Sexual Activity   Alcohol use: No   Drug use: No   Sexual activity: Not Currently    Birth control/protection: Surgical  Other Topics Concern   Not on file  Social History Narrative   She lives alone on ground level apartment - has children.    Daughter, [name] is her main caregiver and means for transportation.   She also has an aide that comes in for 2 hours on weekdays to help with bathing, meals, meds etc   Social Determinants of Health   Financial Resource Strain: Low Risk  (11/07/2022)   Overall Financial Resource Strain (CARDIA)    Difficulty of Paying Living Expenses: Not hard at all  Food Insecurity: No Food Insecurity (11/07/2022)   Hunger Vital Sign    Worried About Running Out of Food in the Last Year: Never true    Ran Out of Food in the Last Year: Never true  Transportation Needs: No Transportation Needs (11/07/2022)   PRAPARE - Administrator, Civil Service (Medical): No    Lack of Transportation (Non-Medical): No  Physical Activity: Insufficiently Active (11/07/2022)   Exercise Vital Sign    Days of Exercise per Week: 3 days    Minutes of Exercise per Session: 30 min  Stress: No Stress Concern Present (11/07/2022)   [employer] of Occupational Health - Occupational Stress Questionnaire    Feeling of Stress : Not at all  Social Connections: Socially Isolated (11/07/2022)   Social Connection and Isolation Panel [NHANES]    Frequency of Communication with Friends and Family: More than three times  a week    Frequency of Social Gatherings with Friends and Family: More than three times a week    Attends Religious Services: Never    Database administrator or Organizations: No    Attends Banker Meetings: Never    Marital Status: Widowed  Intimate Partner Violence: Not At Risk (11/07/2022)   Humiliation, Afraid, Rape, and Kick questionnaire    Fear of Current or Ex-Partner: No    Emotionally Abused: No    Physically Abused: No    Sexually Abused: No    Outpatient Encounter Medications as of 12/25/2022  Medication Sig   albuterol (VENTOLIN HFA) 108 (90 Base) MCG/ACT inhaler Inhale 2 puffs into the lungs every 6 (six) hours as needed.   CALCIUM PO Take by mouth daily.   Cholecalciferol (VITAMIN D3) 1000 units  CAPS Take 2,000 Units by mouth.   diclofenac Sodium (VOLTAREN) 1 % GEL Apply 2 g topically 4 (four) times daily.   Emollient (CERAVE DIABETICS DRY SKIN) CREA Apply 1 Application topically 2 (two) times daily.   escitalopram (LEXAPRO) 20 MG tablet Take 1 tablet (20 mg total) by mouth daily.   fluticasone (FLONASE) 50 MCG/ACT nasal spray Place 2 sprays into both nostrils daily.   hydrochlorothiazide (HYDRODIURIL) 25 MG tablet Take 1 tablet (25 mg total) by mouth daily as needed.   hydrocortisone 2.5 % cream Apply topically 2 (two) times daily.   levocetirizine (XYZAL) 5 MG tablet Take 1 tablet (5 mg total) by mouth every evening.   lisinopril (ZESTRIL) 5 MG tablet Take 1 tablet (5 mg total) by mouth daily.   magnesium 30 MG tablet Take 400 mg by mouth daily at 6 (six) AM.   meloxicam (MOBIC) 7.5 MG tablet Take 1 tablet (7.5 mg total) by mouth daily.   naloxone (NARCAN) nasal spray 4 mg/0.1 mL As needed or respiratory depression or decreased mental status related to narcotic use / overdose   nystatin (MYCOSTATIN/NYSTOP) powder Apply 1 application. topically 3 (three) times daily.   rosuvastatin (CRESTOR) 20 MG tablet TAKE 1 TABLET ONCE DAILY.   traZODone (DESYREL) 50 MG  tablet TAKE 1 TABLET BY MOUTH AT BEDTIME.   triamcinolone (KENALOG) 0.025 % ointment APPLY 1 APPLICATION TOPICALLY 2 TIMES A DAY.   [DISCONTINUED] traMADol (ULTRAM) 50 MG tablet Take 2 tablets (100 mg total) by mouth every 12 (twelve) hours.   [DISCONTINUED] traMADol (ULTRAM) 50 MG tablet Take 2 tablets (100 mg total) by mouth 2 (two) times daily.   [DISCONTINUED] traMADol (ULTRAM) 50 MG tablet Take 2 tablets (100 mg total) by mouth 2 (two) times daily.   traMADol (ULTRAM) 50 MG tablet Take 2 tablets (100 mg total) by mouth every 12 (twelve) hours.   [START ON 01/24/2023] traMADol (ULTRAM) 50 MG tablet Take 2 tablets (100 mg total) by mouth 2 (two) times daily.   [START ON 02/23/2023] traMADol (ULTRAM) 50 MG tablet Take 2 tablets (100 mg total) by mouth 2 (two) times daily.   No facility-administered encounter medications on file as of 12/25/2022.    No Known Allergies  Review of Systems  Constitutional:  Positive for activity change. Negative for appetite change, chills, diaphoresis, fatigue, fever and unexpected weight change.  HENT: Negative.    Eyes: Negative.  Negative for photophobia and visual disturbance.  Respiratory:  Negative for cough, chest tightness and shortness of breath.   Cardiovascular:  Negative for chest pain, palpitations and leg swelling.  Gastrointestinal:  Negative for abdominal distention, abdominal pain, anal bleeding, blood in stool, constipation, diarrhea, nausea, rectal pain and vomiting.       Dark stools  Endocrine: Negative.   Genitourinary:  Negative for decreased urine volume, difficulty urinating, dysuria, frequency and urgency.  Musculoskeletal:  Positive for arthralgias, back pain, gait problem and joint swelling. Negative for myalgias, neck pain and neck stiffness.  Skin: Negative.   Allergic/Immunologic: Negative.   Neurological:  Negative for dizziness, tremors, seizures, syncope, facial asymmetry, speech difficulty, weakness, light-headedness,  numbness and headaches.  Hematological: Negative.   Psychiatric/Behavioral:  Negative for confusion, hallucinations, sleep disturbance and suicidal ideas.   All other systems reviewed and are negative.       Objective:  BP (!) 142/78   Pulse 73   Temp (!) 97.5 F (36.4 C) (Temporal)   Ht 5\' 4"  (1.626 m)   Wt 160  lb 3.2 oz (72.7 kg)   SpO2 95%   BMI 27.50 kg/m    Wt Readings from Last 3 Encounters:  12/25/22 160 lb 3.2 oz (72.7 kg)  11/19/22 158 lb 9.6 oz (71.9 kg)  11/07/22 157 lb (71.2 kg)    Physical Exam Vitals and nursing note reviewed.  Constitutional:      General: She is not in acute distress.    Appearance: She is obese. She is ill-appearing (chronically ill). She is not toxic-appearing or diaphoretic.  HENT:     Head: Normocephalic and atraumatic.     Nose: Nose normal.     Mouth/Throat:     Mouth: Mucous membranes are moist.     Pharynx: Oropharynx is clear.  Eyes:     Conjunctiva/sclera: Conjunctivae normal.     Pupils: Pupils are equal, round, and reactive to light.  Cardiovascular:     Rate and Rhythm: Normal rate and regular rhythm.     Heart sounds: Normal heart sounds. No murmur heard.    No friction rub. No gallop.  Pulmonary:     Effort: Pulmonary effort is normal.     Breath sounds: Normal breath sounds.  Abdominal:     General: Bowel sounds are normal.     Palpations: Abdomen is soft.     Tenderness: There is no abdominal tenderness.  Musculoskeletal:     Cervical back: Normal, normal range of motion and neck supple.     Thoracic back: No deformity. Decreased range of motion.     Lumbar back: No deformity. Decreased range of motion.     Right upper leg: Normal.     Left upper leg: Normal.     Right knee: Swelling present. No deformity, effusion, erythema, ecchymosis or lacerations. Decreased range of motion. Tenderness present.     Left knee: Swelling present. No deformity, effusion, erythema, ecchymosis or lacerations. Decreased range of  motion. Tenderness present.     Right lower leg: Normal. No edema.     Left lower leg: Normal. No edema.  Skin:    General: Skin is warm and dry.     Capillary Refill: Capillary refill takes less than 2 seconds.     Coloration: Skin is not pale.  Neurological:     General: No focal deficit present.     Mental Status: She is alert and oriented to person, place, and time.     Gait: Gait abnormal (antalgic, using rolling walker).  Psychiatric:        Mood and Affect: Mood normal.        Behavior: Behavior normal.        Thought Content: Thought content normal.        Judgment: Judgment normal.     Results for orders placed or performed in visit on 11/19/22  [facility]  Result Value Ref Range   Glucose 107 (H) 70 - 99 mg/dL   BUN 12 8 - 27 mg/dL   Creatinine, Ser 5.40 0.57 - 1.00 mg/dL   eGFR 66 >98 JX/BJY/7.82   BUN/Creatinine Ratio 13 12 - 28   Sodium 143 134 - 144 mmol/L   Potassium 4.2 3.5 - 5.2 mmol/L   Chloride 101 96 - 106 mmol/L   CO2 26 20 - 29 mmol/L   Calcium 10.0 8.7 - 10.3 mg/dL     Joint Injection/Arthrocentesis  Date/Time: 12/25/2022 9:32 AM  Performed by: Sonny Masters, FNP Authorized by: Sonny Masters, FNP  Indications: joint swelling and pain  Body  area: knee Joint: right knee Local anesthesia used: yes  Anesthesia: Local anesthesia used: yes Local Anesthetic: co-phenylcaine spray  Sedation: Patient sedated: no  Preparation: Patient was prepped and draped in the usual sterile fashion. Needle size: 20 G Ultrasound guidance: no Approach: anterior Aspirate amount: 0 mL Methylprednisolone amount: 60 mg Lidocaine 2% amount: 3.5 mL Patient tolerance: patient tolerated the procedure well with no immediate complications   Joint Injection/Arthrocentesis  Date/Time: 12/25/2022 9:33 AM  Performed by: Sonny Masters, FNP Authorized by: Sonny Masters, FNP  Indications: joint swelling and pain  Body area: knee Joint: left knee Local anesthesia used:  yes  Anesthesia: Local anesthesia used: yes Local Anesthetic: co-phenylcaine spray  Sedation: Patient sedated: no  Preparation: Patient was prepped and draped in the usual sterile fashion. Needle size: 20 G Ultrasound guidance: no Approach: anterior Aspirate amount: 0 mL Methylprednisolone amount: 60 mg Lidocaine 2% amount: 3.5 mL Patient tolerance: patient tolerated the procedure well with no immediate complications      Pertinent labs & imaging results that were available during my care of the patient were reviewed by me and considered in my medical decision making.  Assessment & Plan:  Oney was seen today for pain management and melena.  Diagnoses and all orders for this visit:  Primary osteoarthritis involving multiple joints Chronic pain of both knees Bilateral knee injections today. Tolerated well. Will renew tramadol as this is very beneficial. Labs pending.  -     traMADol (ULTRAM) 50 MG tablet; Take 2 tablets (100 mg total) by mouth every 12 (twelve) hours. -     traMADol (ULTRAM) 50 MG tablet; Take 2 tablets (100 mg total) by mouth 2 (two) times daily. -     traMADol (ULTRAM) 50 MG tablet; Take 2 tablets (100 mg total) by mouth 2 (two) times daily. -     CMP14+EGFR -     VITAMIN D 25 Hydroxy (Vit-D Deficiency, Fractures) -     Joint Injection/Arthrocentesis -     Joint Injection/Arthrocentesis  Vitamin D deficiency Labs pending. Continue repletion therapy. If indicated, will change repletion dosage. Eat foods rich in Vit D including milk, orange juice, yogurt with vitamin D added, salmon or mackerel, canned tuna fish, cereals with vitamin D added, and cod liver oil. Get out in the sun but make sure to wear at least SPF 30 sunscreen.  -     CMP14+EGFR -     VITAMIN D 25 Hydroxy (Vit-D Deficiency, Fractures)  Primary hypertension BP fairly controlled. Changes were not made in regimen today. Goal BP is 130/80. Pt aware to report any persistent high or low  readings. DASH diet and exercise encouraged. Exercise at least 150 minutes per week and increase as tolerated. Goal BMI > 25. Stress management encouraged. Avoid nicotine and tobacco product use. Avoid excessive alcohol and NSAID's. Avoid more than 2000 mg of sodium daily. Medications as prescribed. Follow up as scheduled.  -     CMP14+EGFR -     CBC with Differential/Platelet -     Thyroid Panel With TSH -     Lipid panel  Mixed hyperlipidemia Diet encouraged - increase intake of fresh fruits and vegetables, increase intake of lean proteins. Bake, broil, or grill foods. Avoid fried, greasy, and fatty foods. Avoid fast foods. Increase intake of fiber-rich whole grains. Exercise encouraged - at least 150 minutes per week and advance as tolerated.  Goal BMI < 25. Continue medications as prescribed. Follow up in 3-6 months as  discussed.  -     CMP14+EGFR -     Lipid panel  Black stool Likely due to iron in multivitamins. Will check labs and hemoccult. Referral to GI if warranted.  -     CBC with Differential/Platelet -     Fecal occult blood, imunochemical; Future      Continue all other maintenance medications.  Follow up plan: Return in about 3 months (around 03/27/2023) for pain management .   Continue healthy lifestyle choices, including diet (rich in fruits, vegetables, and lean proteins, and low in salt and simple carbohydrates) and exercise (at least 30 minutes of moderate physical activity daily).  Educational handout given for chronic pain, knee injection  The above assessment and management plan was discussed with the patient. The patient verbalized understanding of and has agreed to the management plan. Patient is aware to call the clinic if they develop any new symptoms or if symptoms persist or worsen. Patient is aware when to return to the clinic for a follow-up visit. Patient educated on when it is appropriate to go to the emergency department.   Kari Baars,  FNP-C [facility] [phone]

## 2022-12-26 LAB — CMP14+EGFR
ALT: 11 IU/L (ref 0–32)
AST: 19 IU/L (ref 0–40)
Albumin: 4.2 g/dL (ref 3.8–4.8)
Alkaline Phosphatase: 54 IU/L (ref 44–121)
BUN/Creatinine Ratio: 17 (ref 12–28)
BUN: 19 mg/dL (ref 8–27)
Bilirubin Total: 0.4 mg/dL (ref 0.0–1.2)
CO2: 28 mmol/L (ref 20–29)
Calcium: 10.1 mg/dL (ref 8.7–10.3)
Chloride: 102 mmol/L (ref 96–106)
Creatinine, Ser: 1.14 mg/dL — ABNORMAL HIGH (ref 0.57–1.00)
Globulin, Total: 2.3 g/dL (ref 1.5–4.5)
Glucose: 109 mg/dL — ABNORMAL HIGH (ref 70–99)
Potassium: 4.7 mmol/L (ref 3.5–5.2)
Sodium: 144 mmol/L (ref 134–144)
Total Protein: 6.5 g/dL (ref 6.0–8.5)
eGFR: 50 mL/min/{1.73_m2} — ABNORMAL LOW (ref >59)

## 2022-12-26 LAB — CBC WITH DIFFERENTIAL/PLATELET
Basophils Absolute: 0.1 10*3/uL (ref 0.0–0.2)
Basos: 1 %
EOS (ABSOLUTE): 0.5 10*3/uL — ABNORMAL HIGH (ref 0.0–0.4)
Eos: 4 %
Hematocrit: 43.8 % (ref 34.0–46.6)
Hemoglobin: 14.4 g/dL (ref 11.1–15.9)
Immature Grans (Abs): 0 10*3/uL (ref 0.0–0.1)
Immature Granulocytes: 0 %
Lymphocytes Absolute: 4 10*3/uL — ABNORMAL HIGH (ref 0.7–3.1)
Lymphs: 29 %
MCH: 34 pg — ABNORMAL HIGH (ref 26.6–33.0)
MCHC: 32.9 g/dL (ref 31.5–35.7)
MCV: 104 fL — ABNORMAL HIGH (ref 79–97)
Monocytes Absolute: 1 10*3/uL — ABNORMAL HIGH (ref 0.1–0.9)
Monocytes: 8 %
Neutrophils Absolute: 8 10*3/uL — ABNORMAL HIGH (ref 1.4–7.0)
Neutrophils: 58 %
Platelets: 188 10*3/uL (ref 150–450)
RBC: 4.23 x10E6/uL (ref 3.77–5.28)
RDW: 11.8 % (ref 11.7–15.4)
WBC: 13.6 10*3/uL — ABNORMAL HIGH (ref 3.4–10.8)

## 2022-12-26 LAB — THYROID PANEL WITH TSH
Free Thyroxine Index: 2.1 (ref 1.2–4.9)
T3 Uptake Ratio: 26 % (ref 24–39)
T4, Total: 7.9 ug/dL (ref 4.5–12.0)
TSH: 0.999 u[IU]/mL (ref 0.450–4.500)

## 2022-12-26 LAB — LIPID PANEL
Chol/HDL Ratio: 3.1 ratio (ref 0.0–4.4)
Cholesterol, Total: 158 mg/dL (ref 100–199)
HDL: 51 mg/dL (ref >39)
LDL Chol Calc (NIH): 69 mg/dL (ref 0–99)
Triglycerides: 234 mg/dL — ABNORMAL HIGH (ref 0–149)
VLDL Cholesterol Cal: 38 mg/dL (ref 5–40)

## 2022-12-26 LAB — VITAMIN D 25 HYDROXY (VIT D DEFICIENCY, FRACTURES): Vit D, 25-Hydroxy: 44.5 ng/mL (ref 30.0–100.0)

## 2022-12-26 NOTE — Addendum Note (Signed)
Addended by: Sonny Masters on: 12/26/2022 07:42 AM   Modules accepted: Orders

## 2023-01-10 ENCOUNTER — Inpatient Hospital Stay: Payer: Medicare HMO | Attending: Oncology | Admitting: Oncology

## 2023-01-10 ENCOUNTER — Encounter: Payer: Self-pay | Admitting: Oncology

## 2023-01-10 VITALS — BP 124/78 | HR 58 | Temp 98.9°F | Resp 19 | Wt 159.2 lb

## 2023-01-10 DIAGNOSIS — M1990 Unspecified osteoarthritis, unspecified site: Secondary | ICD-10-CM | POA: Diagnosis not present

## 2023-01-10 DIAGNOSIS — I10 Essential (primary) hypertension: Secondary | ICD-10-CM | POA: Diagnosis not present

## 2023-01-10 DIAGNOSIS — Z720 Tobacco use: Secondary | ICD-10-CM

## 2023-01-10 DIAGNOSIS — Z79899 Other long term (current) drug therapy: Secondary | ICD-10-CM | POA: Insufficient documentation

## 2023-01-10 DIAGNOSIS — F17210 Nicotine dependence, cigarettes, uncomplicated: Secondary | ICD-10-CM | POA: Diagnosis not present

## 2023-01-10 DIAGNOSIS — D72829 Elevated white blood cell count, unspecified: Secondary | ICD-10-CM | POA: Diagnosis not present

## 2023-01-10 MED ORDER — NICOTINE 14 MG/24HR TD PT24: MEDICATED_PATCH | TRANSDERMAL | 0 refills | Status: DC

## 2023-01-10 NOTE — Assessment & Plan Note (Signed)
Discussed the risks of tobacco use/cigarette smoking Patient smoked a pack of cigarettes for at least 60 years now Patient is willing to try cutting down/quit Nicotine patches prescribed

## 2023-01-10 NOTE — Progress Notes (Signed)
[facility] HEMATOLOGY NEW VISIT  Sonny Masters, FNP  REASON FOR REFERRAL: Leukocytosis  SUMMARY OF HEMATOLOGIC HISTORY:    Latest Ref Rng & Units 12/25/2022    9:37 AM 05/30/2022   11:11 AM 05/01/2022    9:21 AM  CBC  WBC 3.4 - 10.8 x10E3/uL 13.6  15.9  14.9   Hemoglobin 11.1 - 15.9 g/dL 13.2  44.0  10.2   Hematocrit 34.0 - 46.6 % 43.8  38.7  40.6   Platelets 150 - 450 x10E3/uL 188  264  259      Latest Reference Range & Units 04/28/17 11:11 05/09/17 09:00 05/30/17 10:50 07/02/17 07:50 03/29/18 15:40 03/30/18 05:04 03/31/18 05:38 04/02/18 04:29 04/04/18 04:15 04/13/18 07:30 04/16/18 07:00 05/08/18 18:34 07/21/18 09:05 01/26/19 15:45 06/21/20 11:53 12/27/20 16:23 07/04/21 08:51 10/02/21 10:05 01/03/22 10:49 05/01/22 09:21 05/30/22 11:11 12/25/22 09:37  WBC 3.4 - 10.8 x10E3/uL 15.5 (H) 15.2 (H) 14.0 (H) 13.9 (H) 17.4 (H) 20.5 (H) 16.6 (H) 15.3 (H) 15.2 (H) 13.9 (H) 19.3 (H) 21.4 (H) 17.4 (H) 12.1 (H) 14.4 (H) 15.8 (H) 15.4 (H) 16.5 (H) 13.6 (H) 15.9 (H) 14.9 (H) 15.9 (H) 13.6 (H)  (H): Data is abnormally high  HISTORY OF PRESENT ILLNESS: . Denise Underwood 77 y.o. female referred by primary care for leukocytosis . She is accompanied by her daughter today.  She has a past medical history of arthritis, hypertension and has been a lifelong smoker.  She reports no complaints today.  Been doing very well.  She denies fever, chills, night sweats, abdominal pain, chest pain, weight loss, loss of appetite.  She lives by self and is very functional.  Patient has been a lifetime smoker-smokes 1 pack/day for at least 60 years now, occasional alcohol use.  No family history of leukemia or any cancer.  Patient stated that she had leukocytosis for a very long time, workup done at [facility] in 2019 and was told she has no leukemia.  We reviewed the labs from several years together and discussed the possibility of inflammation as a cause of this leukocytosis.  I  have reviewed the past medical history, past surgical history, social history and family history with the patient   ALLERGIES:  has No Known Allergies.  MEDICATIONS:  Current Outpatient Medications  Medication Sig Dispense Refill   albuterol (VENTOLIN HFA) 108 (90 Base) MCG/ACT inhaler Inhale 2 puffs into the lungs every 6 (six) hours as needed. 18 g 2   CALCIUM PO Take by mouth daily.     Cholecalciferol (VITAMIN D3) 1000 units CAPS Take 2,000 Units by mouth.     diclofenac Sodium (VOLTAREN) 1 % GEL Apply 2 g topically 4 (four) times daily. 350 g 2   Emollient (CERAVE DIABETICS DRY SKIN) CREA Apply 1 Application topically 2 (two) times daily. 236 mL 11   escitalopram (LEXAPRO) 20 MG tablet Take 1 tablet (20 mg total) by mouth daily. 90 tablet 1   fluticasone (FLONASE) 50 MCG/ACT nasal spray Place 2 sprays into both nostrils daily. 16 g 6   hydrochlorothiazide (HYDRODIURIL) 25 MG tablet Take 1 tablet (25 mg total) by mouth daily as needed. 90 tablet 1   hydrocortisone 2.5 % cream Apply topically 2 (two) times daily. 453.6 g 2   levocetirizine (XYZAL) 5 MG tablet Take 1 tablet (5 mg total) by mouth every evening. 90 tablet 1   lisinopril (ZESTRIL) 5 MG tablet Take 1 tablet (5 mg total) by mouth daily.  90 tablet 3   magnesium 30 MG tablet Take 400 mg by mouth daily at 6 (six) AM.     meloxicam (MOBIC) 7.5 MG tablet Take 1 tablet (7.5 mg total) by mouth daily. 30 tablet 2   naloxone (NARCAN) nasal spray 4 mg/0.1 mL As needed or respiratory depression or decreased mental status related to narcotic use / overdose 1 each 6   nicotine (NICODERM CQ - DOSED IN MG/24 HOURS) 14 mg/24hr patch RX #2 Weeks 5-6: 14 mg x 1 patch daily. Wear for 24 hours. If you have sleep disturbances, remove at bedtime. 14 patch 0   nystatin (MYCOSTATIN/NYSTOP) powder Apply 1 application. topically 3 (three) times daily. 60 g 0   rosuvastatin (CRESTOR) 20 MG tablet TAKE 1 TABLET ONCE DAILY. 30 tablet 0   traMADol (ULTRAM)  50 MG tablet Take 2 tablets (100 mg total) by mouth every 12 (twelve) hours. 120 tablet 0   [START ON 01/24/2023] traMADol (ULTRAM) 50 MG tablet Take 2 tablets (100 mg total) by mouth 2 (two) times daily. 120 tablet 0   [START ON 02/23/2023] traMADol (ULTRAM) 50 MG tablet Take 2 tablets (100 mg total) by mouth 2 (two) times daily. 120 tablet 0   traZODone (DESYREL) 50 MG tablet TAKE 1 TABLET BY MOUTH AT BEDTIME. 30 tablet 0   triamcinolone (KENALOG) 0.025 % ointment APPLY 1 APPLICATION TOPICALLY 2 TIMES A DAY. 80 g 1   No current facility-administered medications for this visit.     REVIEW OF SYSTEMS:   Constitutional: Denies fevers, chills or night sweats Eyes: Denies blurriness of vision Ears, nose, mouth, throat, and face: Denies mucositis or sore throat Respiratory: Denies cough, dyspnea or wheezes Cardiovascular: Denies palpitation, chest discomfort or lower extremity swelling Gastrointestinal:  Denies nausea, heartburn or change in bowel habits Skin: Denies abnormal skin rashes Lymphatics: Denies new lymphadenopathy or easy bruising Neurological:Denies numbness, tingling or new weaknesses Behavioral/Psych: Mood is stable, no new changes  All other systems were reviewed with the patient and are negative.  PHYSICAL EXAMINATION:   Vitals:   01/10/23 0955  BP: 124/78  Pulse: (!) 58  Resp: 19  Temp: 98.9 F (37.2 C)  SpO2: 95%    GENERAL:alert, no distress and comfortable SKIN: skin color, texture, turgor are normal, no rashes or significant lesions So you said your body and your EYES: normal, Conjunctiva are pink and non-injected, sclera clear LYMPH:  no palpable lymphadenopathy in the cervical, axillary or inguinal LUNGS: clear to auscultation and percussion with normal breathing effort HEART: regular rate & rhythm and no murmurs and no lower extremity edema ABDOMEN:abdomen soft, non-tender and normal bowel sounds Musculoskeletal:no cyanosis of digits and no clubbing   NEURO: alert & oriented x 3 with fluent speech.  LABORATORY DATA:  I have reviewed the data as listed  Lab Results  Component Value Date   WBC 13.6 (H) 12/25/2022   NEUTROABS 8.0 (H) 12/25/2022   HGB 14.4 12/25/2022   HCT 43.8 12/25/2022   MCV 104 (H) 12/25/2022   PLT 188 12/25/2022      Component Value Date/Time   NA 144 12/25/2022 0937   K 4.7 12/25/2022 0937   CL 102 12/25/2022 0937   CO2 28 12/25/2022 0937   GLUCOSE 109 (H) 12/25/2022 0937   GLUCOSE 100 (H) 05/08/2018 1834   BUN 19 12/25/2022 0937   CREATININE 1.14 (H) 12/25/2022 0937   CALCIUM 10.1 12/25/2022 0937   PROT 6.5 12/25/2022 0937   ALBUMIN 4.2 12/25/2022 9562  AST 19 12/25/2022 0937   ALT 11 12/25/2022 0937   ALKPHOS 54 12/25/2022 0937   BILITOT 0.4 12/25/2022 0937   GFRNONAA 64 06/21/2020 1153   GFRAA 74 06/21/2020 1153      Chemistry      Component Value Date/Time   NA 144 12/25/2022 0937   K 4.7 12/25/2022 0937   CL 102 12/25/2022 0937   CO2 28 12/25/2022 0937   BUN 19 12/25/2022 0937   CREATININE 1.14 (H) 12/25/2022 0937      Component Value Date/Time   CALCIUM 10.1 12/25/2022 0937   ALKPHOS 54 12/25/2022 0937   AST 19 12/25/2022 0937   ALT 11 12/25/2022 0937   BILITOT 0.4 12/25/2022 0937      Latest Reference Range & Units 04/28/17 11:11 05/09/17 09:00 05/30/17 10:50 07/02/17 07:50 03/29/18 15:40 03/30/18 05:04 03/31/18 05:38 04/02/18 04:29 04/04/18 04:15 04/13/18 07:30 04/16/18 07:00 05/08/18 18:34 07/21/18 09:05 01/26/19 15:45 06/21/20 11:53 12/27/20 16:23 07/04/21 08:51 10/02/21 10:05 01/03/22 10:49 05/01/22 09:21 05/30/22 11:11 12/25/22 09:37  WBC 3.4 - 10.8 x10E3/uL 15.5 (H) 15.2 (H) 14.0 (H) 13.9 (H) 17.4 (H) 20.5 (H) 16.6 (H) 15.3 (H) 15.2 (H) 13.9 (H) 19.3 (H) 21.4 (H) 17.4 (H) 12.1 (H) 14.4 (H) 15.8 (H) 15.4 (H) 16.5 (H) 13.6 (H) 15.9 (H) 14.9 (H) 15.9 (H) 13.6 (H)  (H): Data is abnormally high  Latest Reference Range & Units 10/02/21 10:05  Ferritin 15 - 150 ng/mL 597 (H)   (H): Data is abnormally high  Latest Reference Range & Units 05/30/17 10:50 07/02/17 07:50  ANA Titer 1  Negative   ANA Ab, IFA   Negative  Cyclic Citrullin Peptide Ab 0 - 19 units 6   RA Latex Turbid. 0.0 - 13.9 IU/mL <10.0 11.0  ANA,IFA RA DIAG PNL W/RFLX TIT/PATN  Rpt   Rpt: View report in Results Review for more information   ASSESSMENT & PLAN:  Patient is a 77 year old female referred for leukocytosis.  We discussed the diagnosis of leukocytosis that is white blood cells being high, possible several etiologies and mechanisms of how this might happen and possible risks of infection and leukemia.  We discussed that she was extensively worked up before and no cause was found and that this is likely related to her inflammation from smoking.  We also discussed warning signs to look for and patient is aware to reach back to [place] if needed.  All questions and concerns were answered.   Leukocytosis Patient has a history of leukocytosis with neutrophil predominance for several years now WBC trend as above Previous rheumatology workup has been negative and flow cytometry was negative based on document We discussed the probable etiology of leukocytosis being smoking [inflammation due to smoking] and noninfectious at this time especially with her being asymptomatic No concern for leukemia at this time, as patient had this for at least 6 years and is entirely asymptomatic.  She also has normal other cell lines making this concern even more less. I offered patient testing for probable leukemia versus just monitoring at this time and patient chose to be monitored and avoiding a blood draw at this time Discussed in detail warning signs to look out for including weight loss, fever, chills, fatigue, lymphadenopathy and to reach out to [place] if needed at that time Would recommend primary care to frequently trend CBC with differential refer back to [place] if there is anything concerning  Tobacco use Discussed  the risks of tobacco use/cigarette smoking Patient smoked a pack  of cigarettes for at least 60 years now Patient is willing to try cutting down/quit Nicotine patches prescribed   No orders of the defined types were placed in this encounter.   The total time spent in the appointment was 45 minutes encounter with patients including review of chart and various tests results, discussions about plan of care and coordination of care plan and documentation   All questions were answered. The patient knows to call the clinic with any problems, questions or concerns. No barriers to learning was detected.   Cindie Crumbly, MD 8/30/202411:07 AM

## 2023-01-10 NOTE — Assessment & Plan Note (Addendum)
Patient has a history of leukocytosis with neutrophil predominance for several years now WBC trend as above Previous rheumatology workup has been negative and flow cytometry was negative based on document We discussed the probable etiology of leukocytosis being smoking [inflammation due to smoking] and noninfectious at this time especially with her being asymptomatic No concern for leukemia at this time, as patient had this for at least 6 years and is entirely asymptomatic.  She also has normal other cell lines making this concern even more less. I offered patient testing for probable leukemia versus just monitoring at this time and patient chose to be monitored and avoiding a blood draw at this time Discussed in detail warning signs to look out for including weight loss, fever, chills, fatigue, lymphadenopathy and to reach out to [place] if needed at that time Would recommend primary care to frequently trend CBC with differential refer back to [place] if there is anything concerning

## 2023-01-17 ENCOUNTER — Other Ambulatory Visit: Payer: Self-pay | Admitting: Family Medicine

## 2023-01-17 DIAGNOSIS — F331 Major depressive disorder, recurrent, moderate: Secondary | ICD-10-CM

## 2023-01-17 DIAGNOSIS — G479 Sleep disorder, unspecified: Secondary | ICD-10-CM

## 2023-01-17 DIAGNOSIS — E7841 Elevated Lipoprotein(a): Secondary | ICD-10-CM

## 2023-01-22 DIAGNOSIS — H02102 Unspecified ectropion of right lower eyelid: Secondary | ICD-10-CM | POA: Diagnosis not present

## 2023-01-22 DIAGNOSIS — H16143 Punctate keratitis, bilateral: Secondary | ICD-10-CM | POA: Diagnosis not present

## 2023-01-22 DIAGNOSIS — H25813 Combined forms of age-related cataract, bilateral: Secondary | ICD-10-CM | POA: Diagnosis not present

## 2023-01-22 DIAGNOSIS — H02105 Unspecified ectropion of left lower eyelid: Secondary | ICD-10-CM | POA: Diagnosis not present

## 2023-02-02 ENCOUNTER — Inpatient Hospital Stay (HOSPITAL_COMMUNITY): Payer: Medicare HMO

## 2023-02-02 ENCOUNTER — Emergency Department (HOSPITAL_COMMUNITY): Payer: Medicare HMO

## 2023-02-02 ENCOUNTER — Inpatient Hospital Stay (HOSPITAL_COMMUNITY)
Admission: EM | Admit: 2023-02-02 | Discharge: 2023-02-11 | DRG: 871 | Disposition: A | Discharge status: Home Health | Payer: Medicare HMO | Attending: Internal Medicine | Admitting: Internal Medicine

## 2023-02-02 ENCOUNTER — Other Ambulatory Visit: Payer: Self-pay

## 2023-02-02 ENCOUNTER — Encounter (HOSPITAL_COMMUNITY): Payer: Self-pay

## 2023-02-02 DIAGNOSIS — Z791 Long term (current) use of non-steroidal anti-inflammatories (NSAID): Secondary | ICD-10-CM

## 2023-02-02 DIAGNOSIS — K5730 Diverticulosis of large intestine without perforation or abscess without bleeding: Secondary | ICD-10-CM | POA: Diagnosis not present

## 2023-02-02 DIAGNOSIS — N179 Acute kidney failure, unspecified: Secondary | ICD-10-CM

## 2023-02-02 DIAGNOSIS — G8929 Other chronic pain: Secondary | ICD-10-CM | POA: Diagnosis present

## 2023-02-02 DIAGNOSIS — I2489 Other forms of acute ischemic heart disease: Secondary | ICD-10-CM | POA: Diagnosis not present

## 2023-02-02 DIAGNOSIS — E7800 Pure hypercholesterolemia, unspecified: Secondary | ICD-10-CM | POA: Diagnosis present

## 2023-02-02 DIAGNOSIS — Z1152 Encounter for screening for COVID-19: Secondary | ICD-10-CM

## 2023-02-02 DIAGNOSIS — Z23 Encounter for immunization: Secondary | ICD-10-CM | POA: Diagnosis not present

## 2023-02-02 DIAGNOSIS — Z7189 Other specified counseling: Secondary | ICD-10-CM | POA: Diagnosis not present

## 2023-02-02 DIAGNOSIS — J209 Acute bronchitis, unspecified: Secondary | ICD-10-CM | POA: Diagnosis not present

## 2023-02-02 DIAGNOSIS — G9341 Metabolic encephalopathy: Secondary | ICD-10-CM | POA: Diagnosis not present

## 2023-02-02 DIAGNOSIS — L8990 Pressure ulcer of unspecified site, unspecified stage: Secondary | ICD-10-CM | POA: Diagnosis present

## 2023-02-02 DIAGNOSIS — J9622 Acute and chronic respiratory failure with hypercapnia: Secondary | ICD-10-CM | POA: Diagnosis not present

## 2023-02-02 DIAGNOSIS — Z5320 Procedure and treatment not carried out because of patient's decision for unspecified reasons: Secondary | ICD-10-CM | POA: Diagnosis not present

## 2023-02-02 DIAGNOSIS — N170 Acute kidney failure with tubular necrosis: Secondary | ICD-10-CM | POA: Diagnosis not present

## 2023-02-02 DIAGNOSIS — Z90710 Acquired absence of both cervix and uterus: Secondary | ICD-10-CM

## 2023-02-02 DIAGNOSIS — J9601 Acute respiratory failure with hypoxia: Secondary | ICD-10-CM | POA: Diagnosis not present

## 2023-02-02 DIAGNOSIS — I700 Atherosclerosis of aorta: Secondary | ICD-10-CM | POA: Diagnosis not present

## 2023-02-02 DIAGNOSIS — A419 Sepsis, unspecified organism: Principal | ICD-10-CM | POA: Diagnosis present

## 2023-02-02 DIAGNOSIS — F17210 Nicotine dependence, cigarettes, uncomplicated: Secondary | ICD-10-CM | POA: Diagnosis not present

## 2023-02-02 DIAGNOSIS — R918 Other nonspecific abnormal finding of lung field: Secondary | ICD-10-CM | POA: Diagnosis not present

## 2023-02-02 DIAGNOSIS — J189 Pneumonia, unspecified organism: Secondary | ICD-10-CM | POA: Diagnosis present

## 2023-02-02 DIAGNOSIS — L409 Psoriasis, unspecified: Secondary | ICD-10-CM | POA: Diagnosis present

## 2023-02-02 DIAGNOSIS — I5032 Chronic diastolic (congestive) heart failure: Secondary | ICD-10-CM

## 2023-02-02 DIAGNOSIS — Z555 Less than a high school diploma: Secondary | ICD-10-CM

## 2023-02-02 DIAGNOSIS — R031 Nonspecific low blood-pressure reading: Secondary | ICD-10-CM | POA: Diagnosis not present

## 2023-02-02 DIAGNOSIS — Z602 Problems related to living alone: Secondary | ICD-10-CM | POA: Diagnosis present

## 2023-02-02 DIAGNOSIS — R6521 Severe sepsis with septic shock: Secondary | ICD-10-CM | POA: Diagnosis not present

## 2023-02-02 DIAGNOSIS — E669 Obesity, unspecified: Secondary | ICD-10-CM | POA: Diagnosis present

## 2023-02-02 DIAGNOSIS — J441 Chronic obstructive pulmonary disease with (acute) exacerbation: Secondary | ICD-10-CM | POA: Diagnosis not present

## 2023-02-02 DIAGNOSIS — Z79899 Other long term (current) drug therapy: Secondary | ICD-10-CM

## 2023-02-02 DIAGNOSIS — L89151 Pressure ulcer of sacral region, stage 1: Secondary | ICD-10-CM | POA: Diagnosis present

## 2023-02-02 DIAGNOSIS — Z452 Encounter for adjustment and management of vascular access device: Secondary | ICD-10-CM | POA: Diagnosis not present

## 2023-02-02 DIAGNOSIS — J449 Chronic obstructive pulmonary disease, unspecified: Secondary | ICD-10-CM | POA: Insufficient documentation

## 2023-02-02 DIAGNOSIS — I480 Paroxysmal atrial fibrillation: Secondary | ICD-10-CM | POA: Diagnosis not present

## 2023-02-02 DIAGNOSIS — I5033 Acute on chronic diastolic (congestive) heart failure: Secondary | ICD-10-CM | POA: Diagnosis not present

## 2023-02-02 DIAGNOSIS — R5381 Other malaise: Secondary | ICD-10-CM | POA: Diagnosis present

## 2023-02-02 DIAGNOSIS — Z515 Encounter for palliative care: Secondary | ICD-10-CM | POA: Diagnosis not present

## 2023-02-02 DIAGNOSIS — I110 Hypertensive heart disease with heart failure: Secondary | ICD-10-CM | POA: Diagnosis present

## 2023-02-02 DIAGNOSIS — Z79891 Long term (current) use of opiate analgesic: Secondary | ICD-10-CM

## 2023-02-02 DIAGNOSIS — Z6830 Body mass index (BMI) 30.0-30.9, adult: Secondary | ICD-10-CM

## 2023-02-02 DIAGNOSIS — Z9889 Other specified postprocedural states: Secondary | ICD-10-CM

## 2023-02-02 DIAGNOSIS — R6520 Severe sepsis without septic shock: Secondary | ICD-10-CM | POA: Diagnosis present

## 2023-02-02 DIAGNOSIS — R4182 Altered mental status, unspecified: Secondary | ICD-10-CM | POA: Diagnosis not present

## 2023-02-02 DIAGNOSIS — Z743 Need for continuous supervision: Secondary | ICD-10-CM | POA: Diagnosis not present

## 2023-02-02 DIAGNOSIS — Z7901 Long term (current) use of anticoagulants: Secondary | ICD-10-CM

## 2023-02-02 DIAGNOSIS — R0602 Shortness of breath: Secondary | ICD-10-CM | POA: Diagnosis not present

## 2023-02-02 DIAGNOSIS — R7303 Prediabetes: Secondary | ICD-10-CM | POA: Diagnosis present

## 2023-02-02 DIAGNOSIS — I4892 Unspecified atrial flutter: Secondary | ICD-10-CM | POA: Diagnosis not present

## 2023-02-02 DIAGNOSIS — K7200 Acute and subacute hepatic failure without coma: Secondary | ICD-10-CM | POA: Diagnosis not present

## 2023-02-02 DIAGNOSIS — T380X5A Adverse effect of glucocorticoids and synthetic analogues, initial encounter: Secondary | ICD-10-CM | POA: Diagnosis present

## 2023-02-02 DIAGNOSIS — J440 Chronic obstructive pulmonary disease with acute lower respiratory infection: Secondary | ICD-10-CM | POA: Diagnosis not present

## 2023-02-02 DIAGNOSIS — F411 Generalized anxiety disorder: Secondary | ICD-10-CM | POA: Diagnosis present

## 2023-02-02 DIAGNOSIS — J9602 Acute respiratory failure with hypercapnia: Secondary | ICD-10-CM | POA: Diagnosis not present

## 2023-02-02 DIAGNOSIS — I4891 Unspecified atrial fibrillation: Secondary | ICD-10-CM | POA: Diagnosis not present

## 2023-02-02 DIAGNOSIS — I491 Atrial premature depolarization: Secondary | ICD-10-CM | POA: Diagnosis not present

## 2023-02-02 DIAGNOSIS — Z9851 Tubal ligation status: Secondary | ICD-10-CM

## 2023-02-02 DIAGNOSIS — Z9049 Acquired absence of other specified parts of digestive tract: Secondary | ICD-10-CM | POA: Diagnosis not present

## 2023-02-02 DIAGNOSIS — M159 Polyosteoarthritis, unspecified: Secondary | ICD-10-CM | POA: Diagnosis present

## 2023-02-02 DIAGNOSIS — Z7951 Long term (current) use of inhaled steroids: Secondary | ICD-10-CM

## 2023-02-02 DIAGNOSIS — J9621 Acute and chronic respiratory failure with hypoxia: Secondary | ICD-10-CM | POA: Diagnosis not present

## 2023-02-02 DIAGNOSIS — Z825 Family history of asthma and other chronic lower respiratory diseases: Secondary | ICD-10-CM

## 2023-02-02 DIAGNOSIS — R0902 Hypoxemia: Secondary | ICD-10-CM | POA: Diagnosis not present

## 2023-02-02 DIAGNOSIS — E876 Hypokalemia: Secondary | ICD-10-CM | POA: Diagnosis not present

## 2023-02-02 DIAGNOSIS — R000 Tachycardia, unspecified: Secondary | ICD-10-CM | POA: Diagnosis not present

## 2023-02-02 DIAGNOSIS — Z91199 Patient's noncompliance with other medical treatment and regimen due to unspecified reason: Secondary | ICD-10-CM

## 2023-02-02 DIAGNOSIS — M8580 Other specified disorders of bone density and structure, unspecified site: Secondary | ICD-10-CM | POA: Diagnosis present

## 2023-02-02 LAB — COMPREHENSIVE METABOLIC PANEL
ALT: 445 U/L — ABNORMAL HIGH (ref 0–44)
AST: 768 U/L — ABNORMAL HIGH (ref 15–41)
Albumin: 3 g/dL — ABNORMAL LOW (ref 3.5–5.0)
Alkaline Phosphatase: 105 U/L (ref 38–126)
Anion gap: 15 (ref 5–15)
BUN: 46 mg/dL — ABNORMAL HIGH (ref 8–23)
CO2: 28 mmol/L (ref 22–32)
Calcium: 9 mg/dL (ref 8.9–10.3)
Chloride: 93 mmol/L — ABNORMAL LOW (ref 98–111)
Creatinine, Ser: 2.21 mg/dL — ABNORMAL HIGH (ref 0.44–1.00)
GFR, Estimated: 22 mL/min — ABNORMAL LOW (ref >60)
Glucose, Bld: 189 mg/dL — ABNORMAL HIGH (ref 70–99)
Potassium: 4.9 mmol/L (ref 3.5–5.1)
Sodium: 136 mmol/L (ref 135–145)
Total Bilirubin: 0.9 mg/dL (ref 0.3–1.2)
Total Protein: 6.7 g/dL (ref 6.5–8.1)

## 2023-02-02 LAB — BLOOD GAS, ARTERIAL
Acid-Base Excess: 1.5 mmol/L (ref 0.0–2.0)
Bicarbonate: 33.4 mmol/L — ABNORMAL HIGH (ref 20.0–28.0)
Drawn by: 22223
O2 Saturation: 96.1 %
Patient temperature: 37.1
pCO2 arterial: 96 mmHg (ref 32–48)
pH, Arterial: 7.15 — CL (ref 7.35–7.45)
pO2, Arterial: 161 mmHg — ABNORMAL HIGH (ref 83–108)

## 2023-02-02 LAB — URINALYSIS, W/ REFLEX TO CULTURE (INFECTION SUSPECTED)
Bilirubin Urine: NEGATIVE
Glucose, UA: NEGATIVE mg/dL
Hgb urine dipstick: NEGATIVE
Ketones, ur: NEGATIVE mg/dL
Leukocytes,Ua: NEGATIVE
Nitrite: NEGATIVE
Protein, ur: 100 mg/dL — AB
Specific Gravity, Urine: 1.027 (ref 1.005–1.030)
pH: 5 (ref 5.0–8.0)

## 2023-02-02 LAB — CBC WITH DIFFERENTIAL/PLATELET
Abs Immature Granulocytes: 0.34 10*3/uL — ABNORMAL HIGH (ref 0.00–0.07)
Basophils Absolute: 0.2 10*3/uL — ABNORMAL HIGH (ref 0.0–0.1)
Basophils Relative: 1 %
Eosinophils Absolute: 0.1 10*3/uL (ref 0.0–0.5)
Eosinophils Relative: 0 %
HCT: 38.5 % (ref 36.0–46.0)
Hemoglobin: 12.1 g/dL (ref 12.0–15.0)
Immature Granulocytes: 1 %
Lymphocytes Relative: 5 %
Lymphs Abs: 1.6 10*3/uL (ref 0.7–4.0)
MCH: 34.6 pg — ABNORMAL HIGH (ref 26.0–34.0)
MCHC: 31.4 g/dL (ref 30.0–36.0)
MCV: 110 fL — ABNORMAL HIGH (ref 80.0–100.0)
Monocytes Absolute: 2.3 10*3/uL — ABNORMAL HIGH (ref 0.1–1.0)
Monocytes Relative: 7 %
Neutro Abs: 30 10*3/uL — ABNORMAL HIGH (ref 1.7–7.7)
Neutrophils Relative %: 86 %
Platelets: 232 10*3/uL (ref 150–400)
RBC: 3.5 MIL/uL — ABNORMAL LOW (ref 3.87–5.11)
RDW: 13.5 % (ref 11.5–15.5)
WBC: 34.5 10*3/uL — ABNORMAL HIGH (ref 4.0–10.5)
nRBC: 0.3 % — ABNORMAL HIGH (ref 0.0–0.2)

## 2023-02-02 LAB — TROPONIN I (HIGH SENSITIVITY): Troponin I (High Sensitivity): 84 ng/L — ABNORMAL HIGH (ref <18)

## 2023-02-02 LAB — RESP PANEL BY RT-PCR (RSV, FLU A&B, COVID)  RVPGX2
Influenza A by PCR: NEGATIVE
Influenza B by PCR: NEGATIVE
Resp Syncytial Virus by PCR: NEGATIVE
SARS Coronavirus 2 by RT PCR: NEGATIVE

## 2023-02-02 LAB — APTT: aPTT: 29 seconds (ref 24–36)

## 2023-02-02 LAB — LACTIC ACID, PLASMA
Lactic Acid, Venous: 1.8 mmol/L (ref 0.5–1.9)
Lactic Acid, Venous: 1.3 mmol/L (ref 0.5–1.9)

## 2023-02-02 LAB — BRAIN NATRIURETIC PEPTIDE: B Natriuretic Peptide: 697 pg/mL — ABNORMAL HIGH (ref 0.0–100.0)

## 2023-02-02 LAB — PROTIME-INR
INR: 1.2 (ref 0.8–1.2)
Prothrombin Time: 15.5 seconds — ABNORMAL HIGH (ref 11.4–15.2)

## 2023-02-02 MED ORDER — SODIUM CHLORIDE 0.9 % IV BOLUS
1000.0000 mL | Freq: Once | INTRAVENOUS | Status: AC
  Administered 2023-02-02: 1000 mL via INTRAVENOUS

## 2023-02-02 MED ORDER — ALBUTEROL SULFATE (2.5 MG/3ML) 0.083% IN NEBU
10.0000 mg | INHALATION_SOLUTION | RESPIRATORY_TRACT | Status: AC
  Filled 2023-02-02: qty 12

## 2023-02-02 MED ORDER — ALBUTEROL SULFATE (2.5 MG/3ML) 0.083% IN NEBU
INHALATION_SOLUTION | RESPIRATORY_TRACT | Status: AC
  Filled 2023-02-02: qty 12

## 2023-02-02 MED ORDER — ALBUTEROL SULFATE (2.5 MG/3ML) 0.083% IN NEBU
10.0000 mg | INHALATION_SOLUTION | RESPIRATORY_TRACT | Status: AC
  Administered 2023-02-02: 10 mg via RESPIRATORY_TRACT

## 2023-02-02 MED ORDER — DILTIAZEM LOAD VIA INFUSION
10.0000 mg | Freq: Once | INTRAVENOUS | Status: AC
  Administered 2023-02-02: 10 mg via INTRAVENOUS
  Filled 2023-02-02: qty 10

## 2023-02-02 MED ORDER — SODIUM CHLORIDE 0.9 % IV SOLN
1.0000 g | Freq: Once | INTRAVENOUS | Status: AC
  Administered 2023-02-02: 1 g via INTRAVENOUS
  Filled 2023-02-02: qty 10

## 2023-02-02 MED ORDER — SODIUM CHLORIDE 0.9 % IV SOLN: INTRAVENOUS | Status: DC

## 2023-02-02 MED ORDER — LACTATED RINGERS IV SOLN: INTRAVENOUS | Status: DC

## 2023-02-02 MED ORDER — NOREPINEPHRINE 4 MG/250ML-% IV SOLN
0.0000 ug/min | INTRAVENOUS | Status: DC
  Administered 2023-02-02 – 2023-02-03 (×2): 5 ug/min via INTRAVENOUS
  Filled 2023-02-02 (×2): qty 250

## 2023-02-02 MED ORDER — DILTIAZEM HCL-DEXTROSE 125-5 MG/125ML-% IV SOLN (PREMIX)
5.0000 mg/h | INTRAVENOUS | Status: DC
  Administered 2023-02-02 – 2023-02-04 (×2): 5 mg/h via INTRAVENOUS
  Filled 2023-02-02 (×3): qty 125

## 2023-02-02 MED ORDER — SODIUM CHLORIDE 0.9 % IV SOLN
500.0000 mg | INTRAVENOUS | Status: DC
  Administered 2023-02-02 – 2023-02-06 (×5): 500 mg via INTRAVENOUS
  Filled 2023-02-02 (×5): qty 5

## 2023-02-02 NOTE — ED Notes (Signed)
RT at bedside.

## 2023-02-02 NOTE — ED Notes (Signed)
Patient transported to CT 

## 2023-02-02 NOTE — ED Provider Notes (Addendum)
[place] EMERGENCY DEPARTMENT AT [facility] Provider Note   CSN: [id number] Arrival date & time: 02/02/23  1913     History  Chief Complaint  Patient presents with   Shortness of Breath   Altered Mental Status    Denise Underwood is a 77 y.o. female.   Shortness of Breath Altered Mental Status    This patient is a 77 year old female, she is followed by multiple different services including family medicine, oncology, she is currently treated for a history of a leukocytosis, she is a chronic smoker and endorses having COPD, she also has high cholesterol, she has chronic arthritis pain, she is on lisinopril and hydrochlorothiazide and has hyperlipidemia.  She presents to the hospital today with increasing shortness of breath, this is been going on for a day or 2, gradually worsening, she was found to have an oxygen of 76% on room air, she was sitting in a chair and was so dyspneic she could not get up by herself.  No reports of fevers or swelling of the legs, she denies any cardiac history, in fact on my review of the medical record there is no signs of echocardiograms or cardiac testing.  She has no signs of atrial fibrillation in the past  Home Medications Prior to Admission medications   Medication Sig Start Date End Date Taking? Authorizing Provider  albuterol (VENTOLIN HFA) 108 (90 Base) MCG/ACT inhaler Inhale 2 puffs into the lungs every 6 (six) hours as needed. 06/21/20   Gwenlyn Fudge, FNP  CALCIUM PO Take by mouth daily.    [provider]  Cholecalciferol (VITAMIN D3) 1000 units CAPS Take 2,000 Units by mouth.    [provider]  diclofenac Sodium (VOLTAREN) 1 % GEL Apply 2 g topically 4 (four) times daily. 03/01/20   Delynn Flavin M, DO  Emollient (CERAVE DIABETICS DRY SKIN) CREA Apply 1 Application topically 2 (two) times daily. 06/19/22   Sonny Masters, FNP  escitalopram (LEXAPRO) 20 MG tablet Take 1 tablet (20 mg total) by mouth daily.  05/30/22   Sonny Masters, FNP  fluticasone (FLONASE) 50 MCG/ACT nasal spray Place 2 sprays into both nostrils daily. 04/30/22   Sonny Masters, FNP  hydrochlorothiazide (HYDRODIURIL) 25 MG tablet Take 1 tablet (25 mg total) by mouth daily as needed. 05/30/22   Sonny Masters, FNP  hydrocortisone 2.5 % cream Apply topically 2 (two) times daily. 10/23/18   Remus Loffler, PA-C  levocetirizine (XYZAL) 5 MG tablet Take 1 tablet (5 mg total) by mouth every evening. 11/19/22   Sonny Masters, FNP  lisinopril (ZESTRIL) 5 MG tablet Take 1 tablet (5 mg total) by mouth daily. 11/19/22   Sonny Masters, FNP  magnesium 30 MG tablet Take 400 mg by mouth daily at 6 (six) AM.    [provider]  meloxicam (MOBIC) 7.5 MG tablet Take 1 tablet (7.5 mg total) by mouth daily. 11/19/22   Sonny Masters, FNP  naloxone [facility]) nasal spray 4 mg/0.1 mL As needed or respiratory depression or decreased mental status related to narcotic use / overdose 03/28/22   Sonny Masters, FNP  nicotine (NICODERM CQ - DOSED IN MG/24 HOURS) 14 mg/24hr patch RX #2 Weeks 5-6: 14 mg x 1 patch daily. Wear for 24 hours. If you have sleep disturbances, remove at bedtime. 01/10/23   Cindie Crumbly, MD  nystatin (MYCOSTATIN/NYSTOP) powder Apply 1 application. topically 3 (three) times daily. 10/02/21   Gwenlyn Fudge,  FNP  rosuvastatin (CRESTOR) 20 MG tablet take 1 tablet once daily. 01/17/23   Sonny Masters, FNP  traMADol (ULTRAM) 50 MG tablet Take 2 tablets (100 mg total) by mouth every 12 (twelve) hours. 12/25/22   Sonny Masters, FNP  traMADol (ULTRAM) 50 MG tablet Take 2 tablets (100 mg total) by mouth 2 (two) times daily. 01/24/23   Sonny Masters, FNP  traMADol (ULTRAM) 50 MG tablet Take 2 tablets (100 mg total) by mouth 2 (two) times daily. 02/23/23   Sonny Masters, FNP  traZODone (DESYREL) 50 MG tablet TAKE 1 TABLET BY MOUTH AT BEDTIME. 01/17/23   Sonny Masters, FNP  triamcinolone (KENALOG) 0.025 % ointment APPLY 1 APPLICATION TOPICALLY 2  TIMES A DAY. 09/17/22   Sonny Masters, FNP      Allergies    Patient has no known allergies.    Review of Systems   Review of Systems  Respiratory:  Positive for shortness of breath.   All other systems reviewed and are negative.   Physical Exam Updated Vital Signs BP (!) 81/64   Pulse 90   Temp 98.8 F (37.1 C) (Rectal)   Resp 20   Ht 1.626 m (5\' 4" )   Wt 73 kg   SpO2 97%   BMI 27.62 kg/m  Physical Exam Vitals and nursing note reviewed.  Constitutional:      General: She is in acute distress.     Appearance: She is well-developed. She is ill-appearing.  HENT:     Head: Normocephalic and atraumatic.     Mouth/Throat:     Pharynx: No oropharyngeal exudate.  Eyes:     General: No scleral icterus.       Right eye: No discharge.        Left eye: No discharge.     Conjunctiva/sclera: Conjunctivae normal.     Pupils: Pupils are equal, round, and reactive to light.  Neck:     Thyroid: No thyromegaly.     Vascular: No JVD.  Cardiovascular:     Rate and Rhythm: Tachycardia present. Rhythm irregular.     Heart sounds: Normal heart sounds. No murmur heard.    No friction rub. No gallop.  Pulmonary:     Effort: No respiratory distress.     Breath sounds: Wheezing and rhonchi present. No rales.     Comments: Increased work of breathing, pursed lip breathing, prolonged expiratory phase, speaks in shortened sentences. Abdominal:     General: Bowel sounds are normal. There is no distension.     Palpations: Abdomen is soft. There is no mass.     Tenderness: There is no abdominal tenderness.  Musculoskeletal:        General: No tenderness. Normal range of motion.     Cervical back: Normal range of motion and neck supple.     Right lower leg: No edema.     Left lower leg: No edema.  Lymphadenopathy:     Cervical: No cervical adenopathy.  Skin:    General: Skin is warm and dry.     Findings: No erythema or rash.  Neurological:     Mental Status: She is alert.      Coordination: Coordination normal.  Psychiatric:        Behavior: Behavior normal.     ED Results / Procedures / Treatments   Labs (all labs ordered are listed, but only abnormal results are displayed) Labs Reviewed  COMPREHENSIVE METABOLIC PANEL - Abnormal; Notable for the  following components:      Result Value   Chloride 93 (*)    Glucose, Bld 189 (*)    BUN 46 (*)    Creatinine, Ser 2.21 (*)    Albumin 3.0 (*)    AST 768 (*)    ALT 445 (*)    GFR, Estimated 22 (*)    All other components within normal limits  CBC WITH DIFFERENTIAL/PLATELET - Abnormal; Notable for the following components:   WBC 34.5 (*)    RBC 3.50 (*)    MCV 110.0 (*)    MCH 34.6 (*)    nRBC 0.3 (*)    Neutro Abs 30.0 (*)    Monocytes Absolute 2.3 (*)    Basophils Absolute 0.2 (*)    Abs Immature Granulocytes 0.34 (*)    All other components within normal limits  PROTIME-INR - Abnormal; Notable for the following components:   Prothrombin Time 15.5 (*)    All other components within normal limits  BRAIN NATRIURETIC PEPTIDE - Abnormal; Notable for the following components:   B Natriuretic Peptide 697.0 (*)    All other components within normal limits  URINALYSIS, W/ REFLEX TO CULTURE (INFECTION SUSPECTED) - Abnormal; Notable for the following components:   Color, Urine AMBER (*)    APPearance CLOUDY (*)    Protein, ur 100 (*)    Bacteria, UA RARE (*)    All other components within normal limits  BLOOD GAS, ARTERIAL - Abnormal; Notable for the following components:   pH, Arterial 7.15 (*)    pCO2 arterial 96 (*)    pO2, Arterial 161 (*)    Bicarbonate 33.4 (*)    All other components within normal limits  TROPONIN I (HIGH SENSITIVITY) - Abnormal; Notable for the following components:   Troponin I (High Sensitivity) 84 (*)    All other components within normal limits  RESP PANEL BY RT-PCR (RSV, FLU A&B, COVID)  [id number]  CULTURE, BLOOD (ROUTINE X 2)  CULTURE, BLOOD (ROUTINE X 2)  LACTIC ACID,  PLASMA  LACTIC ACID, PLASMA  APTT    EKG EKG Interpretation Date/Time:  Sunday February 02 2023 19:25:12 EDT Ventricular Rate:  160 PR Interval:    QRS Duration:  100 QT Interval:  299 QTC Calculation: 482 R Axis:   264  Text Interpretation: Atrial fibrillation with rapid V-rate Markedly posterior QRS axis Low voltage, precordial leads Since last tracing afib now present Confirmed by Eber Hong ([id number]) on 02/02/2023 7:28:03 PM   EKG Interpretation Date/Time:  Sunday February 02 2023 21:41:50 EDT Ventricular Rate:  87 PR Interval:  143 QRS Duration:  92 QT Interval:  366 QTC Calculation: 441 R Axis:   49  Text Interpretation: Sinus rhythm Atrial premature complex Probable left atrial enlargement Low voltage, precordial leads RSR' in V1 or V2, probably normal variant Borderline T abnormalities, anterior leads Confirmed by Eber Hong ([id number]) on 02/02/2023 9:45:24 PM         Radiology CT ABDOMEN PELVIS WO CONTRAST  Result Date: 02/02/2023 CLINICAL DATA:  Worsening altered mental status, shortness of breath, tachycardia and borderline hypotension. EXAM: CT ABDOMEN AND PELVIS WITHOUT CONTRAST TECHNIQUE: Multidetector CT imaging of the abdomen and pelvis was performed following the standard protocol without IV contrast. RADIATION DOSE REDUCTION: This exam was performed according to the departmental dose-optimization program which includes automated exposure control, adjustment of the mA and/or kV according to patient size and/or use of iterative reconstruction technique. COMPARISON:  None Available. FINDINGS: Lower chest: Mild nodular  appearing infiltrate and associated atelectatic changes are seen within the posterior aspect of the right lower lobe. There is a small right pleural effusion. Hepatobiliary: No focal liver abnormality is seen. Status post cholecystectomy. No biliary dilatation. Pancreas: Unremarkable. No pancreatic ductal dilatation or surrounding inflammatory  changes. Spleen: Normal in size without focal abnormality. Adrenals/Urinary Tract: Adrenal glands are unremarkable. Kidneys are normal, without renal calculi, focal lesion, or hydronephrosis. The urinary bladder is poorly distended and subsequently limited in evaluation. Mild diffuse urinary bladder wall thickening is seen. Stomach/Bowel: Stomach is within normal limits. The appendix is not clearly identified. No evidence of bowel wall thickening, distention, or inflammatory changes. Small noninflamed diverticula are seen within the proximal and mid sigmoid colon Vascular/Lymphatic: Aortic atherosclerosis. Multiple subcentimeter para-aortic and aortocaval lymph nodes are seen. Reproductive: Status post hysterectomy. No adnexal masses. Other: Multiple small surgical coils are seen along the midline of the anterior abdominal wall. No abdominopelvic ascites. Musculoskeletal: Multilevel degenerative changes seen throughout the lumbar spine. IMPRESSION: 1. Mild nodular appearing right lower lobe infiltrate and associated atelectatic changes. 2. Small right pleural effusion. 3. Evidence of prior cholecystectomy and prior hysterectomy. 4. Sigmoid diverticulosis. 5. Aortic atherosclerosis. Aortic Atherosclerosis (ICD10-I70.0). Electronically Signed   By: Aram Candela M.D.   On: 02/02/2023 21:48   DG Chest Port 1 View  Result Date: 02/02/2023 CLINICAL DATA:  Sepsis.  Shortness of breath EXAM: PORTABLE CHEST 1 VIEW COMPARISON:  03/29/2018 FINDINGS: The heart size and mediastinal contours are within normal limits. Aortic atherosclerosis. Prominent interstitial markings throughout both lungs. No lobar consolidation. No pleural effusion or pneumothorax. The visualized skeletal structures are unremarkable. IMPRESSION: Prominent interstitial markings throughout both lungs, which may reflect edema or atypical/viral infection. Electronically Signed   By: Duanne Guess D.O.   On: 02/02/2023 20:51     Procedures .Critical Care  Performed by: Eber Hong, MD Authorized by: Eber Hong, MD   Critical care provider statement:    Critical care time (minutes):  45   Critical care time was exclusive of:  Separately billable procedures and treating other patients   Critical care was necessary to treat or prevent imminent or life-threatening deterioration of the following conditions:  Respiratory failure, shock and sepsis   Critical care was time spent personally by me on the following activities:  Development of treatment plan with patient or surrogate, discussions with consultants, evaluation of patient's response to treatment, examination of patient, obtaining history from patient or surrogate, review of old charts, re-evaluation of patient's condition, pulse oximetry, ordering and review of radiographic studies, ordering and review of laboratory studies and ordering and performing treatments and interventions   I assumed direction of critical care for this patient from another provider in my specialty: no     Care discussed with: admitting provider   Comments:       .Central Line  Date/Time: 02/02/2023 11:20 PM  Performed by: Eber Hong, MD Authorized by: Eber Hong, MD   Consent:    Consent obtained:  Verbal   Consent given by: daughter.   Risks, benefits, and alternatives were discussed: yes     Risks discussed:  Arterial puncture, incorrect placement, bleeding, infection, pneumothorax and nerve damage   Alternatives discussed:  Alternative treatment Universal protocol:    Procedure explained and questions answered to patient or proxy's satisfaction: yes     Immediately prior to procedure, a time out was called: yes     Patient identity confirmed:  Arm band Pre-procedure details:    Indication(s): central  venous access, hemodynamic monitoring and insufficient peripheral access     Hand hygiene: Hand hygiene performed prior to insertion     Sterile barrier technique:  All elements of maximal sterile technique followed     Skin preparation:  Chlorhexidine   Skin preparation agent: Skin preparation agent completely dried prior to procedure   Sedation:    Sedation type:  None Anesthesia:    Anesthesia method:  None Procedure details:    Location:  R internal jugular   Patient position:  Trendelenburg   Procedural supplies:  Triple lumen   Catheter size:  7 Fr   Landmarks identified: yes     Ultrasound guidance: yes     Ultrasound guidance timing: prior to insertion and real time     Sterile ultrasound techniques: Sterile gel and sterile probe covers were used     Number of attempts:  1   Successful placement: yes   Post-procedure details:    Post-procedure:  Dressing applied and line sutured   Assessment:  No pneumothorax on x-ray, placement verified by x-ray, free fluid flow and blood return through all ports   Procedure completion:  Tolerated well, no immediate complications Comments:           Medications Ordered in ED Medications  lactated ringers infusion ( Intravenous New Bag/Given 02/02/23 2002)  diltiazem (CARDIZEM) 1 mg/mL load via infusion 10 mg (10 mg Intravenous Bolus from Bag 02/02/23 2003)    And  diltiazem (CARDIZEM) 125 mg in dextrose 5% 125 mL (1 mg/mL) infusion (0 mg/hr Intravenous Stopped 02/02/23 2153)  azithromycin (ZITHROMAX) 500 mg in sodium chloride 0.9 % 250 mL IVPB (0 mg Intravenous Stopped 02/02/23 2146)  0.9 %  sodium chloride infusion (0 mLs Intravenous Hold 02/02/23 2050)  albuterol (PROVENTIL) (2.5 MG/3ML) 0.083% nebulizer solution 10 mg (10 mg Nebulization New Bag/Given 02/02/23 2156)  albuterol (PROVENTIL) (2.5 MG/3ML) 0.083% nebulizer solution (  Canceled Entry 02/02/23 2156)  norepinephrine (LEVOPHED) 4mg  in (0.016 mg/mL) premix infusion (5 mcg/min Intravenous New Bag/Given 02/02/23 2236)  albuterol (PROVENTIL) (2.5 MG/3ML) 0.083% nebulizer solution 10 mg (has no administration in time range)  cefTRIAXone  (ROCEPHIN) 1 g in sodium chloride 0.9 % 100 mL IVPB (0 g Intravenous Stopped 02/02/23 2112)  sodium chloride 0.9 % bolus 1,000 mL (0 mLs Intravenous Stopped 02/02/23 2236)  sodium chloride 0.9 % bolus 1,000 mL (0 mLs Intravenous Stopped 02/02/23 2152)    ED Course/ Medical Decision Making/ A&P                                 Medical Decision Making Amount and/or Complexity of Data Reviewed Labs: ordered. Radiology: ordered. ECG/medicine tests: ordered.  Risk Prescription drug management. Decision regarding hospitalization.    This patient presents to the ED for concern of increasing shortness of breath, this involves an extensive number of treatment options, and is a complaint that carries with it a high risk of complications and morbidity.  The differential diagnosis includes new onset atrial fibrillation, congestive heart failure, cardiac disease, pulmonary disease, pneumonia, pneumothorax, less likely to be pulmonary embolism but possibly   Co morbidities that complicate the patient evaluation  Hypertension, elderly   Additional history obtained:  Additional history obtained from medical record External records from outside source obtained and reviewed including office visit notes   Lab Tests:  I Ordered, and personally interpreted labs.  The pertinent results include: Urinalysis with proteinuria but  no signs of significant infection, lactic acid of 1.8, comprehensive metabolic panel showing a creatinine of 2.2 with a BUN of 46 consistent with an acute kidney injury from prerenal kidney failure.  She has a significant transaminitis with LFTs in the 704 100 range with a normal bilirubin and a white blood cell count of 34,500 with a significant leftward shift including immature granulocytes in the bloodstream.  Troponin was 84, BNP was 697 and COVID testing is negative   Imaging Studies ordered:  I ordered imaging studies including portable chest x-ray I independently  visualized and interpreted imaging which showed prominent markings which could be interstitial edema or atypical infection I agree with the radiologist interpretation   Cardiac Monitoring: / EKG:  The patient was maintained on a cardiac monitor.  I personally viewed and interpreted the cardiac monitored which showed an underlying rhythm of: Atrial fibrillation with rapid ventricular rate   Consultations Obtained:  I requested consultation with the intensivist Dr. Briant Sites,  and discussed lab and imaging findings as well as pertinent plan - they recommend: Admission to ICU   Problem List / ED Course / Critical interventions / Medication management  This patient presents critically ill with what appears to be a tacky arrhythmia but also with a significant leukocytosis, her initial heart rhythm was atrial fibrillation with a rapid rate, she was afebrile, she was started on Cardizem with some fluids and the Cardizem eventually caused her to convert into normal sinus rhythm.  After the conversion the patient became progressively hypotensive until the Cardizem was discontinued at which time her blood pressure improved.  Her repeat EKG shows normal sinus rhythm with a rate of 87 bpm with a normal axis, nonspecific T waves, no ST elevation, no LVH. She was given antibiotics for potential infection given the severe leukocytosis including Rocephin and Zithromax I ordered medication including IV fluids for hypotension Reevaluation of the patient after these medicines showed that the patient improved I have reviewed the patients home medicines and have made adjustments as needed   Social Determinants of Health:  Continues to smoke tobacco   Test / Admission - Considered:  Admit to higher level of care Paged hospitalist for admission    I personally viewed and interpreted the x-ray that was performed after central line placement, there is no pneumothorax and the central venous catheter  tip is in appropriate position.     Final Clinical Impression(s) / ED Diagnoses Final diagnoses:  Acute respiratory failure with hypoxia and hypercapnia (HCC)  Septic shock [facility])    Rx / DC Orders ED Discharge Orders     None         Eber Hong, MD 02/02/23 2321    Eber Hong, MD 02/02/23 2322

## 2023-02-02 NOTE — ED Notes (Signed)
ED Provider at bedside. 

## 2023-02-02 NOTE — Consult Note (Signed)
CODE SEPSIS - PHARMACY COMMUNICATION  **Broad Spectrum Antibiotics should be administered within 1 hour of Sepsis diagnosis**  Time Code Sepsis Called/Page Received: 1934  Antibiotics Ordered: azithromycin, ceftriaxone  Time of 1st antibiotic administration: 2032  Additional action taken by pharmacy: N/A  Celene Squibb, PharmD Clinical Pharmacist 02/02/2023 8:11 PM

## 2023-02-02 NOTE — H&P (Incomplete)
NAME:  Denise Underwood, MRN:  [id number], DOB:  13-Feb-1946, LOS: 0 ADMISSION DATE:  02/02/2023, CONSULTATION DATE:  9/22 REFERRING MD:  Dr. Hyacinth Meeker, CHIEF COMPLAINT:  acute respiratory failure w/ hypercapnia/hypoxia   History of Present Illness:  Patient is a 77 year old female with pertinent PMH htn, hld, leukocytosis following w/ heme-onc outpt presents to [facility] ED on 9/22 w/ sepsis.  On 9/22 came to [facility] ED w/ sob that progressively worsened over the past 2 days. On ems arrival sats 76% on room air that improved w/ [place]. On arrival Patient with afib RVR rates 140s bp soft. Started on cardizem drip with improvement in rate but patient became more hypotensive and cardizem stopped and started on levo. BNP 697 and trop 84. Patient afebrile and wbc 34.5 (13 one month ago). CXR w/ interstitial infiltrates vs. Edema. CT abd/pelvis w/ mild nodular RLL infiltrate and small right pleural effusion. Cultures obtained and given iv fluids. UA w/ rare bacteria. Started on rocephin/azithro for cap ppx. Covid/flu negative. LA 1.8. ABG 7.15, 96, 161, 33. Started on bipap. PCCM consulted for icu admission.  Pertinent  Medical History   Past Medical History:  Diagnosis Date  . Anxiety   . Arthritis   . Hyperlipidemia   . Hypertension   . Osteopenia 07/04/2021  . Prediabetes 07/09/2021  . Psoriasis      Significant Hospital Events: Including procedures, antibiotic start and stop dates in addition to other pertinent events   9/22 afib rvr hypotensive after starting cardizem on levo. Resp acidosis on bipap  Interim History / Subjective:  ***  Objective   Blood pressure (!) 81/64, pulse 90, temperature 98.8 F (37.1 C), temperature source Rectal, resp. rate 20, height 5\' 4"  (1.626 m), weight 73 kg, SpO2 97%.    FiO2 (%):  [30 %-40 %] 40 % PEEP:  [5 cmH20] 5 cmH20   Intake/Output Summary (Last 24 hours) at 02/02/2023 2318 Last data filed at 02/02/2023 2236 Gross per 24 hour  Intake 1371.74 ml  Output --   Net 1371.74 ml   Filed Weights   02/02/23 1929  Weight: 73 kg    Examination: General:  *** NAD; critically ill appearing on mech vent HEENT: MM pink/moist; ETT in place Neuro: Aox3; MAE; sedated; CV: s1s2, RRR ***, no m/r/g PULM:  dim clear BS bilaterally; [place] ***; on mech vent PRVC GI: soft, bsx4 active  Extremities: warm/dry, *** edema  Skin: no rashes or lesions    Resolved Hospital Problem list     Assessment & Plan:   Acute respiratory failure w/ hypercapnia/hypoxia Possible RLL PNA Small RLL pleural effusion Plan: -cont bipap wean fio2 for sats >92%; repeat abg in few hours -rocephin/azithro -pulm toiletry -trend cxr -prn xopenex -consider lasix  Shock likely septic 2/2 pna Plan: -cont levo for map goal >65 -cont rocephin/azithro for possible cap -follow cultures -expectorate sputum if able -check rvp, mrsa pcr, urine legionella/strep, pct -trend wbc/fever curve  Afib rvr Elevated trop and bnp Plan: -tele monitoring -heparin gtt -amio *** -replete electrolytes as needed -echo -trend trop  AKI Plan: -given iv fluids -Trend BMP / urinary output -Replace electrolytes as indicated -Avoid nephrotoxic agents, ensure adequate renal perfusion  Mild hyperglycemia Plan: -A1c -cbg monitoring -hold on ssi for now  Elevated LFTs -shock liver? Plan: -check rug [place] -trend cmp -avoid hepatoxic agents -hepatitis panel  Htn Hld Plan: -hold antihypertensives -hold statin given elevated lfts    Best Practice (right click and "Reselect all SmartList Selections" daily)  Diet/type: NPO DVT prophylaxis: prophylactic heparin  GI prophylaxis: N/A Lines: Central line Foley:  N/A Code Status:  full code Last date of multidisciplinary goals of care discussion [***]  Labs   CBC: Recent Labs  Lab 02/02/23 1949  WBC 34.5*  NEUTROABS 30.0*  HGB 12.1  HCT 38.5  MCV 110.0*  PLT 232    Basic Metabolic Panel: Recent Labs  Lab  02/02/23 1949  NA 136  K 4.9  CL 93*  CO2 28  GLUCOSE 189*  BUN 46*  CREATININE 2.21*  CALCIUM 9.0   GFR: Estimated Creatinine Clearance: 20.9 mL/min (A) (by C-G formula based on SCr of 2.21 mg/dL (H)). Recent Labs  Lab 02/02/23 1949 02/02/23 2159  WBC 34.5*  --   LATICACIDVEN 1.8 1.3    Liver Function Tests: Recent Labs  Lab 02/02/23 1949  AST 768*  ALT 445*  ALKPHOS 105  BILITOT 0.9  PROT 6.7  ALBUMIN 3.0*   No results for input(s): "LIPASE", "AMYLASE" in the last 168 hours. No results for input(s): "AMMONIA" in the last 168 hours.  ABG    Component Value Date/Time   PHART 7.15 (LL) 02/02/2023 2215   PCO2ART 96 (HH) 02/02/2023 2215   PO2ART 161 (H) 02/02/2023 2215   HCO3 33.4 (H) 02/02/2023 2215   O2SAT 96.1 02/02/2023 2215     Coagulation Profile: Recent Labs  Lab 02/02/23 1949  INR 1.2    Cardiac Enzymes: No results for input(s): "CKTOTAL", "CKMB", "CKMBINDEX", "TROPONINI" in the last 168 hours.  HbA1C: HB A1C (BAYER DCA - WAIVED)  Date/Time Value Ref Range Status  01/03/2022 10:15 AM 5.3 4.8 - 5.6 % Final    Comment:             Prediabetes: 5.7 - 6.4          Diabetes: >6.4          Glycemic control for adults with diabetes: <7.0   10/02/2021 09:58 AM 5.3 4.8 - 5.6 % Final    Comment:             Prediabetes: 5.7 - 6.4          Diabetes: >6.4          Glycemic control for adults with diabetes: <7.0     CBG: No results for input(s): "GLUCAP" in the last 168 hours.  Review of Systems:   ***  Past Medical History:  She,  has a past medical history of Anxiety, Arthritis, Hyperlipidemia, Hypertension, Osteopenia (07/04/2021), Prediabetes (07/09/2021), and Psoriasis.   Surgical History:   Past Surgical History:  Procedure Laterality Date  . ABDOMINAL HYSTERECTOMY    . CHOLECYSTECTOMY    . HERNIA REPAIR    . KNEE SURGERY    . TUBAL LIGATION       Social History:   reports that she has been smoking cigarettes. She has a 20  pack-year smoking history. She has never used smokeless tobacco. She reports that she does not drink alcohol and does not use drugs.   Family History:  Her family history is not on file.   Allergies No Known Allergies   Home Medications  Prior to Admission medications   Medication Sig Start Date End Date Taking? Authorizing Provider  albuterol (VENTOLIN HFA) 108 (90 Base) MCG/ACT inhaler Inhale 2 puffs into the lungs every 6 (six) hours as needed. 06/21/20   Gwenlyn Fudge, FNP  CALCIUM PO Take by mouth daily.    [provider]  Cholecalciferol (  VITAMIN D3) 1000 units CAPS Take 2,000 Units by mouth.    [provider]  diclofenac Sodium (VOLTAREN) 1 % GEL Apply 2 g topically 4 (four) times daily. 03/01/20   Delynn Flavin M, DO  Emollient (CERAVE DIABETICS DRY SKIN) CREA Apply 1 Application topically 2 (two) times daily. 06/19/22   Sonny Masters, FNP  escitalopram (LEXAPRO) 20 MG tablet Take 1 tablet (20 mg total) by mouth daily. 05/30/22   Sonny Masters, FNP  fluticasone (FLONASE) 50 MCG/ACT nasal spray Place 2 sprays into both nostrils daily. 04/30/22   Sonny Masters, FNP  hydrochlorothiazide (HYDRODIURIL) 25 MG tablet Take 1 tablet (25 mg total) by mouth daily as needed. 05/30/22   Sonny Masters, FNP  hydrocortisone 2.5 % cream Apply topically 2 (two) times daily. 10/23/18   Remus Loffler, PA-C  levocetirizine (XYZAL) 5 MG tablet Take 1 tablet (5 mg total) by mouth every evening. 11/19/22   Sonny Masters, FNP  lisinopril (ZESTRIL) 5 MG tablet Take 1 tablet (5 mg total) by mouth daily. 11/19/22   Sonny Masters, FNP  magnesium 30 MG tablet Take 400 mg by mouth daily at 6 (six) AM.    [provider]  meloxicam (MOBIC) 7.5 MG tablet Take 1 tablet (7.5 mg total) by mouth daily. 11/19/22   Sonny Masters, FNP  naloxone [facility]) nasal spray 4 mg/0.1 mL As needed or respiratory depression or decreased mental status related to narcotic use / overdose 03/28/22   Sonny Masters, FNP  nicotine (NICODERM CQ - DOSED IN MG/24 HOURS) 14 mg/24hr patch RX #2 Weeks 5-6: 14 mg x 1 patch daily. Wear for 24 hours. If you have sleep disturbances, remove at bedtime. 01/10/23   Cindie Crumbly, MD  nystatin (MYCOSTATIN/NYSTOP) powder Apply 1 application. topically 3 (three) times daily. 10/02/21   Gwenlyn Fudge, FNP  rosuvastatin (CRESTOR) 20 MG tablet take 1 tablet once daily. 01/17/23   Sonny Masters, FNP  traMADol (ULTRAM) 50 MG tablet Take 2 tablets (100 mg total) by mouth every 12 (twelve) hours. 12/25/22   Sonny Masters, FNP  traMADol (ULTRAM) 50 MG tablet Take 2 tablets (100 mg total) by mouth 2 (two) times daily. 01/24/23   Sonny Masters, FNP  traMADol (ULTRAM) 50 MG tablet Take 2 tablets (100 mg total) by mouth 2 (two) times daily. 02/23/23   Sonny Masters, FNP  traZODone (DESYREL) 50 MG tablet TAKE 1 TABLET BY MOUTH AT BEDTIME. 01/17/23   Sonny Masters, FNP  triamcinolone (KENALOG) 0.025 % ointment APPLY 1 APPLICATION TOPICALLY 2 TIMES A DAY. 09/17/22   Sonny Masters, FNP     Critical care time: 45 minutes    JD Anselm Lis [facility] 02/02/2023, 11:18 PM  Please see [email or web address] for pager details.  From 7A-7P if no response, please call [phone]. After hours, please call ELink [phone].

## 2023-02-02 NOTE — ED Triage Notes (Signed)
Pt arrived via [facility] from home who report family called due to Pts worsening AMS, SOB, tachycardia and borderline hypotension. Pt found to be 75% on RA on scene, [facility] placed Pt on 4L [place] and O2 improved to 94%.

## 2023-02-02 NOTE — Sepsis Progress Note (Signed)
Elink following code sepsis °

## 2023-02-03 ENCOUNTER — Inpatient Hospital Stay (HOSPITAL_COMMUNITY): Payer: Medicare HMO

## 2023-02-03 ENCOUNTER — Encounter (HOSPITAL_COMMUNITY): Payer: Self-pay

## 2023-02-03 DIAGNOSIS — J9602 Acute respiratory failure with hypercapnia: Principal | ICD-10-CM

## 2023-02-03 DIAGNOSIS — L8990 Pressure ulcer of unspecified site, unspecified stage: Secondary | ICD-10-CM | POA: Diagnosis present

## 2023-02-03 DIAGNOSIS — J9601 Acute respiratory failure with hypoxia: Principal | ICD-10-CM

## 2023-02-03 DIAGNOSIS — A419 Sepsis, unspecified organism: Secondary | ICD-10-CM | POA: Diagnosis present

## 2023-02-03 DIAGNOSIS — R6520 Severe sepsis without septic shock: Secondary | ICD-10-CM | POA: Diagnosis present

## 2023-02-03 DIAGNOSIS — I4891 Unspecified atrial fibrillation: Secondary | ICD-10-CM | POA: Diagnosis not present

## 2023-02-03 LAB — BLOOD GAS, ARTERIAL
Acid-Base Excess: 2.7 mmol/L — ABNORMAL HIGH (ref 0.0–2.0)
Acid-base deficit: 0.9 mmol/L (ref 0.0–2.0)
Bicarbonate: 29.4 mmol/L — ABNORMAL HIGH (ref 20.0–28.0)
Bicarbonate: 31.4 mmol/L — ABNORMAL HIGH (ref 20.0–28.0)
Drawn by: 22223
Drawn by: 20012
FIO2: 40 %
Mode: POSITIVE
O2 Saturation: 99.8 %
O2 Saturation: 99.7 %
PEEP: 5 cmH2O
Patient temperature: 37
Patient temperature: 36.3
RATE: 18 resp/min
pCO2 arterial: 77 mmHg (ref 32–48)
pCO2 arterial: 68 mmHg (ref 32–48)
pH, Arterial: 7.19 — CL (ref 7.35–7.45)
pH, Arterial: 7.27 — ABNORMAL LOW (ref 7.35–7.45)
pO2, Arterial: 103 mmHg (ref 83–108)
pO2, Arterial: 92 mmHg (ref 83–108)

## 2023-02-03 LAB — CBC WITH DIFFERENTIAL/PLATELET
Abs Immature Granulocytes: 0 10*3/uL (ref 0.00–0.07)
Band Neutrophils: 6 %
Basophils Absolute: 0 10*3/uL (ref 0.0–0.1)
Basophils Relative: 0 %
Eosinophils Absolute: 0 10*3/uL (ref 0.0–0.5)
Eosinophils Relative: 0 %
HCT: 36.8 % (ref 36.0–46.0)
Hemoglobin: 11.1 g/dL — ABNORMAL LOW (ref 12.0–15.0)
Lymphocytes Relative: 1 %
Lymphs Abs: 0.4 10*3/uL — ABNORMAL LOW (ref 0.7–4.0)
MCH: 34 pg (ref 26.0–34.0)
MCHC: 30.2 g/dL (ref 30.0–36.0)
MCV: 112.9 fL — ABNORMAL HIGH (ref 80.0–100.0)
Monocytes Absolute: 1.2 10*3/uL — ABNORMAL HIGH (ref 0.1–1.0)
Monocytes Relative: 3 %
Neutro Abs: 37.7 10*3/uL — ABNORMAL HIGH (ref 1.7–7.7)
Neutrophils Relative %: 90 %
Platelets: 212 10*3/uL (ref 150–400)
RBC: 3.26 MIL/uL — ABNORMAL LOW (ref 3.87–5.11)
RDW: 13.5 % (ref 11.5–15.5)
WBC: 39.3 10*3/uL — ABNORMAL HIGH (ref 4.0–10.5)
nRBC: 0.4 % — ABNORMAL HIGH (ref 0.0–0.2)
nRBC: 1 /100 WBC — ABNORMAL HIGH

## 2023-02-03 LAB — TROPONIN I (HIGH SENSITIVITY)
Troponin I (High Sensitivity): 109 ng/L (ref <18)
Troponin I (High Sensitivity): 117 ng/L (ref <18)
Troponin I (High Sensitivity): 106 ng/L (ref <18)
Troponin I (High Sensitivity): 102 ng/L (ref <18)

## 2023-02-03 LAB — GLUCOSE, CAPILLARY
Glucose-Capillary: 155 mg/dL — ABNORMAL HIGH (ref 70–99)
Glucose-Capillary: 167 mg/dL — ABNORMAL HIGH (ref 70–99)

## 2023-02-03 LAB — COMPREHENSIVE METABOLIC PANEL
ALT: 390 U/L — ABNORMAL HIGH (ref 0–44)
AST: 601 U/L — ABNORMAL HIGH (ref 15–41)
Albumin: 2.6 g/dL — ABNORMAL LOW (ref 3.5–5.0)
Alkaline Phosphatase: 116 U/L (ref 38–126)
Anion gap: 12 (ref 5–15)
BUN: 47 mg/dL — ABNORMAL HIGH (ref 8–23)
CO2: 29 mmol/L (ref 22–32)
Calcium: 8.6 mg/dL — ABNORMAL LOW (ref 8.9–10.3)
Chloride: 99 mmol/L (ref 98–111)
Creatinine, Ser: 2.18 mg/dL — ABNORMAL HIGH (ref 0.44–1.00)
GFR, Estimated: 23 mL/min — ABNORMAL LOW (ref >60)
Glucose, Bld: 171 mg/dL — ABNORMAL HIGH (ref 70–99)
Potassium: 4.7 mmol/L (ref 3.5–5.1)
Sodium: 140 mmol/L (ref 135–145)
Total Bilirubin: 0.6 mg/dL (ref 0.3–1.2)
Total Protein: 6.5 g/dL (ref 6.5–8.1)

## 2023-02-03 LAB — RESPIRATORY PANEL BY PCR

## 2023-02-03 LAB — HEMOGLOBIN A1C
Hgb A1c MFr Bld: 6.2 % — ABNORMAL HIGH (ref 4.8–5.6)
Mean Plasma Glucose: 131.24 mg/dL

## 2023-02-03 LAB — HEPATITIS PANEL, ACUTE
HCV Ab: NONREACTIVE
Hep A IgM: NONREACTIVE
Hep B C IgM: NONREACTIVE
Hepatitis B Surface Ag: NONREACTIVE

## 2023-02-03 LAB — HEPARIN LEVEL (UNFRACTIONATED): Heparin Unfractionated: 0.71 IU/mL — ABNORMAL HIGH (ref 0.30–0.70)

## 2023-02-03 LAB — STREP PNEUMONIAE URINARY ANTIGEN: Strep Pneumo Urinary Antigen: NEGATIVE

## 2023-02-03 LAB — MAGNESIUM
Magnesium: 2.1 mg/dL (ref 1.7–2.4)
Magnesium: 1.9 mg/dL (ref 1.7–2.4)

## 2023-02-03 MED ORDER — ALBUTEROL SULFATE (2.5 MG/3ML) 0.083% IN NEBU: 2.5000 mg | INHALATION_SOLUTION | RESPIRATORY_TRACT | Status: DC | PRN

## 2023-02-03 MED ORDER — POLYETHYLENE GLYCOL 3350 17 G PO PACK: 17.0000 g | PACK | Freq: Every day | ORAL | Status: DC | PRN

## 2023-02-03 MED ORDER — DOCUSATE SODIUM 100 MG PO CAPS: 100.0000 mg | ORAL_CAPSULE | Freq: Two times a day (BID) | ORAL | Status: DC | PRN

## 2023-02-03 MED ORDER — BUDESONIDE 0.5 MG/2ML IN SUSP
0.5000 mg | Freq: Two times a day (BID) | RESPIRATORY_TRACT | Status: DC
  Administered 2023-02-03 – 2023-02-11 (×15): 0.5 mg via RESPIRATORY_TRACT
  Filled 2023-02-03 (×16): qty 2

## 2023-02-03 MED ORDER — SODIUM CHLORIDE 0.9 % IV SOLN
2.0000 g | INTRAVENOUS | Status: DC
  Administered 2023-02-03 – 2023-02-06 (×4): 2 g via INTRAVENOUS
  Filled 2023-02-03 (×4): qty 20

## 2023-02-03 MED ORDER — BUDESONIDE 0.25 MG/2ML IN SUSP
0.2500 mg | Freq: Two times a day (BID) | RESPIRATORY_TRACT | Status: DC
  Administered 2023-02-03: 0.25 mg via RESPIRATORY_TRACT
  Filled 2023-02-03: qty 2

## 2023-02-03 MED ORDER — ALBUTEROL SULFATE (2.5 MG/3ML) 0.083% IN NEBU
INHALATION_SOLUTION | RESPIRATORY_TRACT | Status: AC
  Administered 2023-02-03: 2.5 mg
  Filled 2023-02-03: qty 3

## 2023-02-03 MED ORDER — HEPARIN BOLUS VIA INFUSION
3500.0000 [IU] | Freq: Once | INTRAVENOUS | Status: AC
  Administered 2023-02-03: 3500 [IU] via INTRAVENOUS
  Filled 2023-02-03: qty 3500

## 2023-02-03 MED ORDER — NYSTATIN 100000 UNIT/GM EX POWD
Freq: Two times a day (BID) | CUTANEOUS | Status: DC
  Administered 2023-02-04: 1 via TOPICAL
  Filled 2023-02-03 (×2): qty 15

## 2023-02-03 MED ORDER — ARFORMOTEROL TARTRATE 15 MCG/2ML IN NEBU
15.0000 ug | INHALATION_SOLUTION | Freq: Two times a day (BID) | RESPIRATORY_TRACT | Status: DC
  Administered 2023-02-03 – 2023-02-11 (×16): 15 ug via RESPIRATORY_TRACT
  Filled 2023-02-03 (×17): qty 2

## 2023-02-03 MED ORDER — IPRATROPIUM-ALBUTEROL 0.5-2.5 (3) MG/3ML IN SOLN: 3.0000 mL | Freq: Four times a day (QID) | RESPIRATORY_TRACT | Status: DC | PRN

## 2023-02-03 MED ORDER — HEPARIN (PORCINE) 25000 UT/250ML-% IV SOLN
950.0000 [IU]/h | INTRAVENOUS | Status: DC
  Administered 2023-02-03: 1000 [IU]/h via INTRAVENOUS
  Filled 2023-02-03: qty 250

## 2023-02-03 MED ORDER — NOREPINEPHRINE 4 MG/250ML-% IV SOLN: 5.0000 ug/min | INTRAVENOUS | Status: DC

## 2023-02-03 MED ORDER — REVEFENACIN 175 MCG/3ML IN SOLN
175.0000 ug | Freq: Every day | RESPIRATORY_TRACT | Status: DC
  Administered 2023-02-03 – 2023-02-11 (×9): 175 ug via RESPIRATORY_TRACT
  Filled 2023-02-03 (×9): qty 3

## 2023-02-03 MED ORDER — ORAL CARE MOUTH RINSE
15.0000 mL | OROMUCOSAL | Status: DC
  Administered 2023-02-03 – 2023-02-11 (×27): 15 mL via OROMUCOSAL

## 2023-02-03 MED ORDER — CHLORHEXIDINE GLUCONATE CLOTH 2 % EX PADS
6.0000 | MEDICATED_PAD | Freq: Every day | CUTANEOUS | Status: DC
  Administered 2023-02-03 – 2023-02-10 (×7): 6 via TOPICAL

## 2023-02-03 MED ORDER — POLYVINYL ALCOHOL 1.4 % OP SOLN: 1.0000 [drp] | OPHTHALMIC | Status: DC | PRN

## 2023-02-03 MED ORDER — ORAL CARE MOUTH RINSE: 15.0000 mL | OROMUCOSAL | Status: DC | PRN

## 2023-02-03 MED ORDER — NICOTINE 21 MG/24HR TD PT24
21.0000 mg | MEDICATED_PATCH | Freq: Every day | TRANSDERMAL | Status: DC
  Administered 2023-02-03 – 2023-02-11 (×9): 21 mg via TRANSDERMAL
  Filled 2023-02-03 (×9): qty 1

## 2023-02-03 MED ORDER — SODIUM CHLORIDE 0.9 % IV SOLN
250.0000 mL | INTRAVENOUS | Status: DC
  Administered 2023-02-03: 250 mL via INTRAVENOUS

## 2023-02-03 MED ORDER — IPRATROPIUM-ALBUTEROL 0.5-2.5 (3) MG/3ML IN SOLN
3.0000 mL | Freq: Four times a day (QID) | RESPIRATORY_TRACT | Status: DC
  Administered 2023-02-03: 3 mL via RESPIRATORY_TRACT
  Filled 2023-02-03: qty 3

## 2023-02-03 MED ORDER — HYDROCORTISONE SOD SUC (PF) 100 MG IJ SOLR
100.0000 mg | Freq: Two times a day (BID) | INTRAMUSCULAR | Status: DC
  Administered 2023-02-03 – 2023-02-04 (×2): 100 mg via INTRAVENOUS
  Filled 2023-02-03 (×2): qty 2

## 2023-02-03 MED ORDER — DEXMEDETOMIDINE HCL IN NACL 200 MCG/50ML IV SOLN
0.0000 ug/kg/h | INTRAVENOUS | Status: DC
  Administered 2023-02-03 – 2023-02-04 (×2): 0.4 ug/kg/h via INTRAVENOUS
  Filled 2023-02-03 (×2): qty 50

## 2023-02-03 NOTE — ED Notes (Signed)
Pt daughter updated on pt's status and which room she will be going to at [facility].

## 2023-02-03 NOTE — H&P (Signed)
NAME:  Denise Underwood, MRN:  [id number], DOB:  [birth year], LOS: 1 ADMISSION DATE:  02/02/2023, CONSULTATION DATE:  02/02/2023 REFERRING MD:  Eber Hong, MD EDP, CHIEF COMPLAINT:  acute respiratory failure    History of Present Illness:  77 year old female with past medical history of hypertension, hyperlipidemia, pre-diabetes, arthritis, leukocytosis, COPD, tobacco use who presented to [facility] emergency department on 02/02/23 for shortness of breath. Noted to have 1-2 days of worsening shortness of breath and found to have room air sat of 76% by EMS. Noted to be dyspneic and unable to even stand at home. In the ED, tachypneic, new atrial fibrillation with RVR,. She was placed on oxygen. Cardizem drip was started and the patient converted to normal sinus rhythm. Subsequently, she continued to be hypotensive and 2L IVF were ordered and eventually required vasopressor support. EDP placed central line. Additionally, her laboratory work up showed new WBC 34.5, Cl 93, BUN/Cr 46/ 2.21, AST 768, ALT 445, troponin 84>109>117, BNP 697, lactic wnl. UA without UTI. BCX sent. CXR showing edema vs atypical infection, CT AP showing RLL infilatrate, small right pleural effusion, no acute intraabdominal findings. ABG ordered showed pH 7.15, pCO2 96, pO2 161. Placed on BiPAP. Repeat 7.19/77/103. She was started on Rocephin, Zithromax.  PCCM was consulted for admission.   Pertinent  Medical History  hypertension, hyperlipidemia, pre-diabetes, arthritis, leukocytosis, COPD, tobacco use  Significant Hospital Events: Including procedures, antibiotic start and stop dates in addition to other pertinent events   9/23: admitted to PCCM  Interim History / Subjective:  Still feels short of breath. Arrived to ICU on BiPAP, afib vs flutter on cardizem gtt   Objective   Blood pressure 121/83, pulse (!) 113, temperature 97.6 F (36.4 C), temperature source Axillary, resp. rate 20, height 5\' 4"  (1.626 m), weight 73 kg, SpO2  96%.    FiO2 (%):  [30 %-40 %] 40 % PEEP:  [5 cmH20] 5 cmH20   Intake/Output Summary (Last 24 hours) at 02/03/2023 0848 Last data filed at 02/03/2023 7253 Gross per 24 hour  Intake 2192.54 ml  Output 0 ml  Net 2192.54 ml   Filed Weights   02/02/23 1929  Weight: 73 kg    Examination: General: elderly appearing, chronically ill appearing, on BiPAP with increased WOB  HENT: limited by BiPAP but EOM intact, PERRLA  Lungs: crackles bilaterally, diminished lung bases Cardiovascular: afib vs flutter on telemetry, obtaining formal 12-lead. No murmur, rub, gallop Abdomen: rounded, soft, non-tender, +BS Extremities: moves extremities equally well. No significant edema Neuro: alert, non focal exam. Oriented and able to speak 5 words or so at a time  GU: deferred  Resolved Hospital Problem list    Assessment & Plan:  Shock likely secondary to sepsis from community acquired pneumonia. CT demonstrating RLL infiltrate. RVP negative. UA not consistent with UTI. CTAP without intraabdominal source. - con't rocephin, azithromycin for CAP - vasopressor with MAP goal >65 - follow blood cultures - sputum culture if able to expectorate  - RVP, MRSA PCR, urine legionella/strep - trend WBC, fever curve   Acute hypercarbic respiratory failure 2/2 RLL PNA  Small RLL pleural effusion  Underlying COPD and tobacco use. - trend ABG  - con't BiPAP, wean FiO2 with O2 sat >92% - pulmonary hygiene  - CXR now and tomorrow AM  - PRN duoneb  Atrial Fibrillation, RVR. New.  Elevated troponin, BNP - likely demand  - telemetry monitoring  - check mag, trend and replete electrolytes as needed  -  con't cardizem gtt. Can switch to amiodarone if not converting  - start heparin gtt, pharmacy driven  - Echo ordered  - trend trop   Elevated LFTs ; likely shock liver  - trend CMP  - hepatitis panel  - RUQ [place]  - avoid hepatotoxic agents as able   Acute Kidney Injury secondary to septic shock. ATN - trend  BMP, electrolytes - monitor urine output  - avoid nephrotoxic agents  Hyperglycemia  - CBG monitoring  - A1c check  - SSI initiation if continues   Hypertension  Hyperlipidemia - holding antihypertensives in setting of shock  - hold statin for now   Tobacco use  - counsel on cessation  - nicotine patch if needed   Best Practice (right click and "Reselect all SmartList Selections" daily)   Diet/type: NPO DVT prophylaxis: systemic heparin GI prophylaxis: N/A Lines: Central line and yes and it is still needed Foley:  N/A Code Status:  full code Last date of multidisciplinary goals of care discussion [discussed plan of care with patient at bedside on arrival]  Labs   CBC: Recent Labs  Lab 02/02/23 1949  WBC 34.5*  NEUTROABS 30.0*  HGB 12.1  HCT 38.5  MCV 110.0*  PLT 232    Basic Metabolic Panel: Recent Labs  Lab 02/02/23 1949  NA 136  K 4.9  CL 93*  CO2 28  GLUCOSE 189*  BUN 46*  CREATININE 2.21*  CALCIUM 9.0  MG 2.1   GFR: Estimated Creatinine Clearance: 20.9 mL/min (A) (by C-G formula based on SCr of 2.21 mg/dL (H)). Recent Labs  Lab 02/02/23 1949 02/02/23 2159  WBC 34.5*  --   LATICACIDVEN 1.8 1.3    Liver Function Tests: Recent Labs  Lab 02/02/23 1949  AST 768*  ALT 445*  ALKPHOS 105  BILITOT 0.9  PROT 6.7  ALBUMIN 3.0*   No results for input(s): "LIPASE", "AMYLASE" in the last 168 hours. No results for input(s): "AMMONIA" in the last 168 hours.  ABG    Component Value Date/Time   PHART 7.19 (LL) 02/03/2023 0132   PCO2ART 77 (HH) 02/03/2023 0132   PO2ART 103 02/03/2023 0132   HCO3 29.4 (H) 02/03/2023 0132   ACIDBASEDEF 0.9 02/03/2023 0132   O2SAT 99.8 02/03/2023 0132     Coagulation Profile: Recent Labs  Lab 02/02/23 1949  INR 1.2    Cardiac Enzymes: No results for input(s): "CKTOTAL", "CKMB", "CKMBINDEX", "TROPONINI" in the last 168 hours.  HbA1C: HB A1C (BAYER DCA - WAIVED)  Date/Time Value Ref Range Status   01/03/2022 10:15 AM 5.3 4.8 - 5.6 % Final    Comment:             Prediabetes: 5.7 - 6.4          Diabetes: >6.4          Glycemic control for adults with diabetes: <7.0   10/02/2021 09:58 AM 5.3 4.8 - 5.6 % Final    Comment:             Prediabetes: 5.7 - 6.4          Diabetes: >6.4          Glycemic control for adults with diabetes: <7.0     CBG: No results for input(s): "GLUCAP" in the last 168 hours.  Review of Systems:   Shortness of breath   Past Medical History:  She,  has a past medical history of Anxiety, Arthritis, Hyperlipidemia, Hypertension, Osteopenia (07/04/2021), Prediabetes (07/09/2021), and Psoriasis.  Surgical History:   Past Surgical History:  Procedure Laterality Date   ABDOMINAL HYSTERECTOMY     CHOLECYSTECTOMY     HERNIA REPAIR     KNEE SURGERY     TUBAL LIGATION       Social History:   reports that she has been smoking cigarettes. She has a 20 pack-year smoking history. She has never used smokeless tobacco. She reports that she does not drink alcohol and does not use drugs.   Family History:  Her family history is not on file.   Allergies No Known Allergies   Home Medications  Prior to Admission medications   Medication Sig Start Date End Date Taking? Authorizing Provider  albuterol (VENTOLIN HFA) 108 (90 Base) MCG/ACT inhaler Inhale 2 puffs into the lungs every 6 (six) hours as needed. 06/21/20   Gwenlyn Fudge, FNP  CALCIUM PO Take by mouth daily.    [provider]  Cholecalciferol (VITAMIN D3) 1000 units CAPS Take 2,000 Units by mouth.    [provider]  diclofenac Sodium (VOLTAREN) 1 % GEL Apply 2 g topically 4 (four) times daily. 03/01/20   Delynn Flavin M, DO  Emollient (CERAVE DIABETICS DRY SKIN) CREA Apply 1 Application topically 2 (two) times daily. 06/19/22   Sonny Masters, FNP  escitalopram (LEXAPRO) 20 MG tablet Take 1 tablet (20 mg total) by mouth daily. 05/30/22   Sonny Masters, FNP  fluticasone  (FLONASE) 50 MCG/ACT nasal spray Place 2 sprays into both nostrils daily. 04/30/22   Sonny Masters, FNP  hydrochlorothiazide (HYDRODIURIL) 25 MG tablet Take 1 tablet (25 mg total) by mouth daily as needed. 05/30/22   Sonny Masters, FNP  hydrocortisone 2.5 % cream Apply topically 2 (two) times daily. 10/23/18   Remus Loffler, PA-C  levocetirizine (XYZAL) 5 MG tablet Take 1 tablet (5 mg total) by mouth every evening. 11/19/22   Sonny Masters, FNP  lisinopril (ZESTRIL) 5 MG tablet Take 1 tablet (5 mg total) by mouth daily. 11/19/22   Sonny Masters, FNP  magnesium 30 MG tablet Take 400 mg by mouth daily at 6 (six) AM.    [provider]  meloxicam (MOBIC) 7.5 MG tablet Take 1 tablet (7.5 mg total) by mouth daily. 11/19/22   Sonny Masters, FNP  naloxone [facility]) nasal spray 4 mg/0.1 mL As needed or respiratory depression or decreased mental status related to narcotic use / overdose 03/28/22   Sonny Masters, FNP  nicotine (NICODERM CQ - DOSED IN MG/24 HOURS) 14 mg/24hr patch RX #2 Weeks 5-6: 14 mg x 1 patch daily. Wear for 24 hours. If you have sleep disturbances, remove at bedtime. 01/10/23   Cindie Crumbly, MD  nystatin (MYCOSTATIN/NYSTOP) powder Apply 1 application. topically 3 (three) times daily. 10/02/21   Gwenlyn Fudge, FNP  rosuvastatin (CRESTOR) 20 MG tablet take 1 tablet once daily. 01/17/23   Sonny Masters, FNP  traMADol (ULTRAM) 50 MG tablet Take 2 tablets (100 mg total) by mouth every 12 (twelve) hours. 12/25/22   Sonny Masters, FNP  traMADol (ULTRAM) 50 MG tablet Take 2 tablets (100 mg total) by mouth 2 (two) times daily. 01/24/23   Sonny Masters, FNP  traMADol (ULTRAM) 50 MG tablet Take 2 tablets (100 mg total) by mouth 2 (two) times daily. 02/23/23   Sonny Masters, FNP  traZODone (DESYREL) 50 MG tablet TAKE 1 TABLET BY MOUTH AT BEDTIME. 01/17/23   [name], [name], FNP  triamcinolone (  KENALOG) 0.025 % ointment APPLY 1 APPLICATION TOPICALLY 2 TIMES A DAY. 09/17/22   Sonny Masters, FNP      Critical care time: 69

## 2023-02-03 NOTE — Progress Notes (Signed)
ANTICOAGULATION CONSULT NOTE - Follow Up Consult  Pharmacy Consult for Heparin Indication: atrial fibrillation  No Known Allergies  Patient Measurements: Height: 5\' 4"  (162.6 cm) Weight: 77.9 kg (171 lb 11.8 oz) IBW/kg (Calculated) : 54.7 Heparin Dosing Weight:  71.2 kg  Vital Signs: Temp: 98.5 F (36.9 C) (09/23 1545) Temp Source: Oral (09/23 1545) BP: 135/69 (09/23 1900) Pulse Rate: 109 (09/23 1900)  Labs: Recent Labs    02/02/23 1949 02/02/23 2155 02/03/23 0210 02/03/23 1029 02/03/23 1251 02/03/23 1829  HGB 12.1  --   --   --   --  11.1*  HCT 38.5  --   --   --   --  36.8  PLT 232  --   --   --   --  212  APTT 29  --   --   --   --   --   LABPROT 15.5*  --   --   --   --   --   INR 1.2  --   --   --   --   --   HEPARINUNFRC  --   --   --   --   --  0.71*  CREATININE 2.21*  --   --   --   --   --   TROPONINIHS 84*   < > 117* 106* 102*  --    < > = values in this interval not displayed.    Estimated Creatinine Clearance: 21.5 mL/min (A) (by C-G formula based on SCr of 2.21 mg/dL (H)).   Assessment:  AC/Heme: Heparin IV for new onset AFib RVR - Hgb 11.1 Plts 212 - Hep level 0.71   Goal of Therapy:  Heparin level 0.3-0.7 units/ml Monitor platelets by anticoagulation protocol: Yes   Plan:  Decrease IV heparin to 950 units/hr Daily HL and CBC  Estela Vinal S. Merilynn Finland, PharmD, BCPS Clinical Staff Pharmacist [email or web address] Merilynn Finland, Nitin Mckowen Stillinger 02/03/2023,7:44 PM

## 2023-02-03 NOTE — Progress Notes (Signed)
Unable to obtain sputum culture due to continuous bipap today.

## 2023-02-03 NOTE — ED Notes (Signed)
Date and time results received: 02/03/23 0122  Test: Delta Troponin Critical Value: 109  Name of Provider Notified: Gaynell Face, DO

## 2023-02-03 NOTE — Progress Notes (Addendum)
eLink Physician-Brief Progress Note Patient Name: Denise Underwood DOB: 22-Sep-1945 MRN: [id number]   Date of Service  02/03/2023  HPI/Events of Note  77 year old woman with COPD admitted via [name] ED for shortness of breath. Hypoxic on arrival to 76%, initial ABG 7.1 5/96/161, placed on BiPAP. Initial labs significant for leukocytosis 34K, BUN/creatinine 46/2.2, elevated LFTs lactate normal.   Becoming anxious while in bed.  Resisting the BiPAP somewhat.  Strict n.p.o. in the setting of ongoing BiPAP with hypercapnic respiratory failure.  eICU Interventions  Initiate Precedex infusion.   2349 -patient was briefly on concurrent Precedex and Cardizem with heart rates in the 70s and blood pressures as low as 80s systolic.  Precedex was turned off and the patient remains RASS of 0.  Also titrated Cardizem off given sustained heart rate in the 70s.  MAP currently acceptable.  Will continue to titrate Precedex and Cardizem as needed to appropriate effect.     Frederik Standley 02/03/2023, 9:38 PM

## 2023-02-03 NOTE — ED Notes (Signed)
Female purewick placed d/t ams, bipap, and pt unable to ambulate safely at this time. Pt acutely critically ill

## 2023-02-03 NOTE — Progress Notes (Signed)
ANTICOAGULATION CONSULT NOTE - Initial Consult  Pharmacy Consult for Heparin Indication: atrial fibrillation  No Known Allergies  Patient Measurements: Height: 5\' 4"  (162.6 cm) Weight: 77.9 kg (171 lb 11.8 oz) IBW/kg (Calculated) : 54.7 Heparin Dosing Weight: 71 kg  Vital Signs: Temp: 97.4 F (36.3 C) (09/23 0930) Temp Source: Axillary (09/23 0930) BP: 121/83 (09/23 0718) Pulse Rate: 110 (09/23 0940)  Labs: Recent Labs    02/02/23 1949 02/02/23 2155 02/03/23 0210  HGB 12.1  --   --   HCT 38.5  --   --   PLT 232  --   --   APTT 29  --   --   LABPROT 15.5*  --   --   INR 1.2  --   --   CREATININE 2.21*  --   --   TROPONINIHS 84* 109* 117*    Estimated Creatinine Clearance: 21.5 mL/min (A) (by C-G formula based on SCr of 2.21 mg/dL (H)).   Medical History: Past Medical History:  Diagnosis Date   Anxiety    Arthritis    Hyperlipidemia    Hypertension    Osteopenia 07/04/2021   Prediabetes 07/09/2021   Psoriasis     Medications:  Scheduled:   Chlorhexidine Gluconate Cloth  6 each Topical Daily   ipratropium-albuterol  3 mL Nebulization Q6H   Infusions:   sodium chloride Stopped (02/02/23 2050)   sodium chloride     azithromycin Stopped (02/02/23 2146)   diltiazem (CARDIZEM) infusion 12 mg/hr (02/03/23 0542)   norepinephrine (LEVOPHED) Adult infusion Stopped (02/03/23 0930)    Assessment: 27 yoF presented [facility] 9/[facility] ED with ShOB, AMS, and in Afib RVR.  She is now transferred to [facility]. Pharmacy is consulted to dose Heparin IV for AFib. No prior to admission anticoagulation noted.   Baseline coag: aPTT 29, INR 1.2 CBC: Hgb 12.1, Plt 232 Troponin: 84, 109, 117  Goal of Therapy:  Heparin level 0.3-0.7 units/ml Monitor platelets by anticoagulation protocol: Yes   Plan:  Give heparin 3500 units bolus IV x 1 Start heparin IV infusion at 1000 units/hr Heparin level 8 hours after starting Daily heparin level and CBC   Lynann Beaver PharmD,  BCPS [facility] main pharmacy [phone] 02/03/2023,10:00 AM

## 2023-02-03 NOTE — ED Notes (Addendum)
ED TO INPATIENT HANDOFF REPORT  ED Nurse Name and Phone #: Jennette Kettle [phone]  S Name/Age/Gender Denise Underwood 77 y.o. female Room/Bed: APA02/APA02  Code Status   Code Status: Prior  Home/SNF/Other Home Patient oriented to: self, place, time, and situation Is this baseline? Yes   Triage Complete: Triage complete  Chief Complaint Septic shock (HCC) [A41.9, R65.21]  Triage Note Pt arrived via [facility] from home who report family called due to Pts worsening AMS, SOB, tachycardia and borderline hypotension. Pt found to be 75% on RA on scene, [facility] placed Pt on 4L [place] and O2 improved to 94%.   Allergies No Known Allergies  Level of Care/Admitting Diagnosis ED Disposition     ED Disposition  Admit   Condition  --   Comment  Hospital Area: [facility] [[id number]]  Level of Care: ICU [6]  May [facility] patient to [facility] if equivalent level of care is available:: Yes  Interfacility transfer: Yes  Covid Evaluation: Asymptomatic - no recent exposure (last 10 days) testing not required  Diagnosis: Septic shock [facility]) [[id number]]  Admitting Physician: Briant Sites [[id number]]  Attending Physician: Briant Sites [phone]  Certification:: I certify this patient will need inpatient services for at least 2 midnights  Expected Medical Readiness: 02/04/2023          B Medical/Surgery History Past Medical History:  Diagnosis Date   Anxiety    Arthritis    Hyperlipidemia    Hypertension    Osteopenia 07/04/2021   Prediabetes 07/09/2021   Psoriasis    Past Surgical History:  Procedure Laterality Date   ABDOMINAL HYSTERECTOMY     CHOLECYSTECTOMY     HERNIA REPAIR     KNEE SURGERY     TUBAL LIGATION       A IV Location/Drains/Wounds Patient Lines/Drains/Airways Status     Active Line/Drains/Airways     Name Placement date Placement time Site Days   Peripheral IV 02/02/23 20 G 1" Anterior;Right Forearm 02/02/23  2001  Forearm  1    Peripheral IV 02/02/23 20 G 1.88" Anterior;Proximal;Right Forearm 02/02/23  2001  Forearm  1   CVC Triple Lumen 02/02/23 Right Internal jugular 20 cm 02/02/23  2320  -- 1   External Urinary Catheter 02/03/23  0537  --  less than 1            Intake/Output Last 24 hours  Intake/Output Summary (Last 24 hours) at 02/03/2023 0720 Last data filed at 02/03/2023 4010 Gross per 24 hour  Intake 2192.54 ml  Output --  Net 2192.54 ml    Labs/Imaging Results for orders placed or performed during the hospital encounter of 02/02/23 (from the past 48 hour(s))  Resp panel by RT-PCR (RSV, Flu A&B, Covid) Anterior Nasal Swab     Status: None   Collection Time: 02/02/23  7:30 PM   Specimen: Anterior Nasal Swab  Result Value Ref Range   SARS Coronavirus 2 by RT PCR NEGATIVE NEGATIVE    Comment: (NOTE) SARS-CoV-2 target nucleic acids are NOT DETECTED.  The SARS-CoV-2 RNA is generally detectable in upper respiratory specimens during the acute phase of infection. The lowest concentration of SARS-CoV-2 viral copies this assay can detect is 138 copies/mL. A negative result does not preclude SARS-Cov-2 infection and should not be used as the sole basis for treatment or other patient management decisions. A negative result may occur with  improper specimen collection/handling, submission of specimen other than nasopharyngeal swab, presence of viral mutation(s)  within the areas targeted by this assay, and inadequate number of viral copies(<138 copies/mL). A negative result must be combined with clinical observations, patient history, and epidemiological information. The expected result is Negative.  Fact Sheet for Patients:  BloggerCourse.com  Fact Sheet for Healthcare Providers:  SeriousBroker.it  This test is no t yet approved or cleared by the [place] FDA and  has been authorized for detection and/or diagnosis of SARS-CoV-2 by FDA under  an Emergency Use Authorization (EUA). This EUA will remain  in effect (meaning this test can be used) for the duration of the COVID-19 declaration under Section 564(b)(1) of the Act, 21 U.S.C.section 360bbb-3(b)(1), unless the authorization is terminated  or revoked sooner.       Influenza A by PCR NEGATIVE NEGATIVE   Influenza B by PCR NEGATIVE NEGATIVE    Comment: (NOTE) The Xpert Xpress SARS-CoV-2/FLU/RSV plus assay is intended as an aid in the diagnosis of influenza from Nasopharyngeal swab specimens and should not be used as a sole basis for treatment. Nasal washings and aspirates are unacceptable for Xpert Xpress SARS-CoV-2/FLU/RSV testing.  Fact Sheet for Patients: BloggerCourse.com  Fact Sheet for Healthcare Providers: SeriousBroker.it  This test is not yet approved or cleared by the [place] FDA and has been authorized for detection and/or diagnosis of SARS-CoV-2 by FDA under an Emergency Use Authorization (EUA). This EUA will remain in effect (meaning this test can be used) for the duration of the COVID-19 declaration under Section 564(b)(1) of the Act, 21 U.S.C. section 360bbb-3(b)(1), unless the authorization is terminated or revoked.     Resp Syncytial Virus by PCR NEGATIVE NEGATIVE    Comment: (NOTE) Fact Sheet for Patients: BloggerCourse.com  Fact Sheet for Healthcare Providers: SeriousBroker.it  This test is not yet approved or cleared by the [place] FDA and has been authorized for detection and/or diagnosis of SARS-CoV-2 by FDA under an Emergency Use Authorization (EUA). This EUA will remain in effect (meaning this test can be used) for the duration of the COVID-19 declaration under Section 564(b)(1) of the Act, 21 U.S.C. section 360bbb-3(b)(1), unless the authorization is terminated or revoked.  Performed at [facility], [address]  [place], [place] [address]   Blood Culture (routine x 2)     Status: None (Preliminary result)   Collection Time: 02/02/23  7:47 PM   Specimen: BLOOD RIGHT FOREARM  Result Value Ref Range   Specimen Description BLOOD RIGHT FOREARM BLOOD    Special Requests      BOTTLES DRAWN AEROBIC AND ANAEROBIC Blood Culture adequate volume   Culture      NO GROWTH < 12 HOURS Performed at [facility], [address], [place], [place] [address]    Report Status PENDING   Brain natriuretic peptide     Status: Abnormal   Collection Time: 02/02/23  7:47 PM  Result Value Ref Range   B Natriuretic Peptide 697.0 (H) 0.0 - 100.0 pg/mL    Comment: Performed at [facility], [address], [place], [place] [address]  Lactic acid, plasma     Status: None   Collection Time: 02/02/23  7:49 PM  Result Value Ref Range   Lactic Acid, Venous 1.8 0.5 - 1.9 mmol/L    Comment: Performed at [facility], [address], [place], [place] [address]  Comprehensive metabolic panel     Status: Abnormal   Collection Time: 02/02/23  7:49 PM  Result Value Ref Range   Sodium 136 135 - 145 mmol/L  Potassium 4.9 3.5 - 5.1 mmol/L   Chloride 93 (L) 98 - 111 mmol/L   CO2 28 22 - 32 mmol/L   Glucose, Bld 189 (H) 70 - 99 mg/dL    Comment: Glucose reference range applies only to samples taken after fasting for at least 8 hours.   BUN 46 (H) 8 - 23 mg/dL   Creatinine, Ser 1.61 (H) 0.44 - 1.00 mg/dL   Calcium 9.0 8.9 - 09.6 mg/dL   Total Protein 6.7 6.5 - 8.1 g/dL   Albumin 3.0 (L) 3.5 - 5.0 g/dL   AST 045 (H) 15 - 41 U/L   ALT 445 (H) 0 - 44 U/L   Alkaline Phosphatase 105 38 - 126 U/L   Total Bilirubin 0.9 0.3 - 1.2 mg/dL   GFR, Estimated 22 (L) >60 mL/min    Comment: (NOTE) Calculated using the CKD-EPI Creatinine Equation (2021)    Anion gap 15 5 - 15    Comment: Performed at [facility], [address], [place], [place] [address]  CBC with Differential     Status: Abnormal   Collection Time: 02/02/23  7:49 PM  Result Value  Ref Range   WBC 34.5 (H) 4.0 - 10.5 K/uL   RBC 3.50 (L) 3.87 - 5.11 MIL/uL   Hemoglobin 12.1 12.0 - 15.0 g/dL   HCT 19.1 47.8 - 29.5 %   MCV 110.0 (H) 80.0 - 100.0 fL   MCH 34.6 (H) 26.0 - 34.0 pg   MCHC 31.4 30.0 - 36.0 g/dL   RDW 62.1 30.8 - 65.7 %   Platelets 232 150 - 400 K/uL   nRBC 0.3 (H) 0.0 - 0.2 %   Neutrophils Relative % 86 %   Neutro Abs 30.0 (H) 1.7 - 7.7 K/uL   Lymphocytes Relative 5 %   Lymphs Abs 1.6 0.7 - 4.0 K/uL   Monocytes Relative 7 %   Monocytes Absolute 2.3 (H) 0.1 - 1.0 K/uL   Eosinophils Relative 0 %   Eosinophils Absolute 0.1 0.0 - 0.5 K/uL   Basophils Relative 1 %   Basophils Absolute 0.2 (H) 0.0 - 0.1 K/uL   Immature Granulocytes 1 %   Abs Immature Granulocytes 0.34 (H) 0.00 - 0.07 K/uL    Comment: Performed at [facility], [address], [place], [place] [address]  Protime-INR     Status: Abnormal   Collection Time: 02/02/23  7:49 PM  Result Value Ref Range   Prothrombin Time 15.5 (H) 11.4 - 15.2 seconds   INR 1.2 0.8 - 1.2    Comment: (NOTE) INR goal varies based on device and disease states. Performed at [facility], [address] [place], [place] [address]   APTT     Status: None   Collection Time: 02/02/23  7:49 PM  Result Value Ref Range   aPTT 29 24 - 36 seconds    Comment: Performed at [facility], [address] [place], [place] [address]  Troponin I (High Sensitivity)     Status: Abnormal   Collection Time: 02/02/23  7:49 PM  Result Value Ref Range   Troponin I (High Sensitivity) 84 (H) <18 ng/L    Comment: (NOTE) Elevated high sensitivity troponin I (hsTnI) values and significant  changes across serial measurements may suggest ACS but many other  chronic and acute conditions are known to elevate hsTnI results.  Refer to the "Links" section for chest pain algorithms and additional  guidance. Performed at [facility], [address], [place], [place] [address]  Magnesium     Status: None   Collection Time: 02/02/23  7:49 PM   Result Value Ref Range   Magnesium 2.1 1.7 - 2.4 mg/dL    Comment: Performed at [facility], [address], [place], [place] [address]  Blood Culture (routine x 2)     Status: None (Preliminary result)   Collection Time: 02/02/23  7:59 PM   Specimen: Right Antecubital; Blood  Result Value Ref Range   Specimen Description RIGHT ANTECUBITAL BLOOD    Special Requests      BOTTLES DRAWN AEROBIC AND ANAEROBIC Blood Culture adequate volume   Culture      NO GROWTH < 12 HOURS Performed at [facility], [address], [place], [place] [address]    Report Status PENDING   Urinalysis, w/ Reflex to Culture (Infection Suspected) -Urine, Clean Catch     Status: Abnormal   Collection Time: 02/02/23  8:56 PM  Result Value Ref Range   Specimen Source URINE, CLEAN CATCH    Color, Urine AMBER (A) YELLOW    Comment: BIOCHEMICALS MAY BE AFFECTED BY COLOR   APPearance CLOUDY (A) CLEAR   Specific Gravity, Urine 1.027 1.005 - 1.030   pH 5.0 5.0 - 8.0   Glucose, UA NEGATIVE NEGATIVE mg/dL   Hgb urine dipstick NEGATIVE NEGATIVE   Bilirubin Urine NEGATIVE NEGATIVE   Ketones, ur NEGATIVE NEGATIVE mg/dL   Protein, ur 098 (A) NEGATIVE mg/dL   Nitrite NEGATIVE NEGATIVE   Leukocytes,Ua NEGATIVE NEGATIVE   RBC / HPF 0-5 0 - 5 RBC/hpf   WBC, UA 6-10 0 - 5 WBC/hpf    Comment:        Reflex urine culture not performed if WBC <=10, OR if Squamous epithelial cells >5. If Squamous epithelial cells >5 suggest recollection.    Bacteria, UA RARE (A) NONE SEEN   Squamous Epithelial / HPF 0-5 0 - 5 /HPF   Hyaline Casts, UA PRESENT    Amorphous Crystal PRESENT     Comment: Performed at [facility], [address], [place], [place] [address]  Troponin I (High Sensitivity)     Status: Abnormal   Collection Time: 02/02/23  9:55 PM  Result Value Ref Range   Troponin I (High Sensitivity) 109 (HH) <18 ng/L    Comment: DELTA CHECK NOTED CRITICAL RESULT CALLED TO, READ BACK BY AND VERIFIED WITH SAPPELT,J @ 0122 ON  02/03/23 BY JUW (NOTE) Elevated high sensitivity troponin I (hsTnI) values and significant  changes across serial measurements may suggest ACS but many other  chronic and acute conditions are known to elevate hsTnI results.  Refer to the "Links" section for chest pain algorithms and additional  guidance. Performed at [facility], [address] [place], [place] [address]   Lactic acid, plasma     Status: None   Collection Time: 02/02/23  9:59 PM  Result Value Ref Range   Lactic Acid, Venous 1.3 0.5 - 1.9 mmol/L    Comment: Performed at [facility], [address], [place], [place] [address]  Blood gas, arterial (at [facility])     Status: Abnormal   Collection Time: 02/02/23 10:15 PM  Result Value Ref Range   pH, Arterial 7.15 (LL) 7.35 - 7.45    Comment: CRITICAL RESULT CALLED TO, READ BACK BY AND VERIFIED WITH: CHESTER,A @ 2228 ON 02/03/23 BY JUW    pCO2 arterial 96 (HH) 32 - 48 mmHg    Comment: CRITICAL RESULT CALLED TO, READ BACK BY AND VERIFIED  WITH: CHESTER,A @ 2228 ON 02/03/23 BY JUW    pO2, Arterial 161 (H) 83 - 108 mmHg   Bicarbonate 33.4 (H) 20.0 - 28.0 mmol/L   Acid-Base Excess 1.5 0.0 - 2.0 mmol/L   O2 Saturation 96.1 %   Patient temperature 37.1    Collection site RIGHT BRACHIAL    Drawn by [id number]    Allens test (pass/fail) PASS PASS    Comment: Performed at [facility], [address] [place], [place] [address]  Blood gas, arterial     Status: Abnormal   Collection Time: 02/03/23  1:32 AM  Result Value Ref Range   pH, Arterial 7.19 (LL) 7.35 - 7.45    Comment: CRITICAL RESULT CALLED TO, READ BACK BY AND VERIFIED WITH: RUSH, @ 0154 ON 02/03/23 BY JUW    pCO2 arterial 77 (HH) 32 - 48 mmHg    Comment: CRITICAL RESULT CALLED TO, READ BACK BY AND VERIFIED WITH: RUSH,C @ 0154 ON 02/03/23 BY JUW    pO2, Arterial 103 83 - 108 mmHg   Bicarbonate 29.4 (H) 20.0 - 28.0 mmol/L   Acid-base deficit 0.9 0.0 - 2.0 mmol/L   O2 Saturation 99.8 %   Patient temperature 37.0     Collection site RIGHT BRACHIAL    Drawn by [id number]    Allens test (pass/fail) PASS PASS    Comment: Performed at [facility], [address], [place], [place] [address]  Troponin I (High Sensitivity)     Status: Abnormal   Collection Time: 02/03/23  2:10 AM  Result Value Ref Range   Troponin I (High Sensitivity) 117 (HH) <18 ng/L    Comment: CRITICAL RESULT CALLED TO, READ BACK BY AND VERIFIED WITH PRUITT,G @ 0245 ON 02/03/23 BY JUW (NOTE) Elevated high sensitivity troponin I (hsTnI) values and significant  changes across serial measurements may suggest ACS but many other  chronic and acute conditions are known to elevate hsTnI results.  Refer to the "Links" section for chest pain algorithms and additional  guidance. Performed at [facility], [address] [place], [place] [address]    DG Chest Port 1 View  Result Date: 02/02/2023 CLINICAL DATA:  Central line placement. EXAM: PORTABLE CHEST 1 VIEW COMPARISON:  February 02, 2023 (8:15 p.m.) FINDINGS: Since the prior study there is been interval placement of a right-sided internal jugular venous catheter. Its distal tip is seen within the distal aspect of the superior vena cava, at the level of the hilum. The heart size and mediastinal contours are within normal limits. Stable, prominent interstitial lung markings is noted. There is no evidence of focal consolidation, pleural effusion or pneumothorax. Multilevel degenerative changes seen throughout the thoracic spine. IMPRESSION: Interval placement of a right-sided internal jugular venous catheter, as described above. Electronically Signed   By: Aram Candela M.D.   On: 02/02/2023 23:37   CT ABDOMEN PELVIS WO CONTRAST  Result Date: 02/02/2023 CLINICAL DATA:  Worsening altered mental status, shortness of breath, tachycardia and borderline hypotension. EXAM: CT ABDOMEN AND PELVIS WITHOUT CONTRAST TECHNIQUE: Multidetector CT imaging of the abdomen and pelvis was performed following the  standard protocol without IV contrast. RADIATION DOSE REDUCTION: This exam was performed according to the departmental dose-optimization program which includes automated exposure control, adjustment of the mA and/or kV according to patient size and/or use of iterative reconstruction technique. COMPARISON:  None Available. FINDINGS: Lower chest: Mild nodular appearing infiltrate and associated atelectatic changes are seen within the posterior aspect of the right lower lobe. There is  a small right pleural effusion. Hepatobiliary: No focal liver abnormality is seen. Status post cholecystectomy. No biliary dilatation. Pancreas: Unremarkable. No pancreatic ductal dilatation or surrounding inflammatory changes. Spleen: Normal in size without focal abnormality. Adrenals/Urinary Tract: Adrenal glands are unremarkable. Kidneys are normal, without renal calculi, focal lesion, or hydronephrosis. The urinary bladder is poorly distended and subsequently limited in evaluation. Mild diffuse urinary bladder wall thickening is seen. Stomach/Bowel: Stomach is within normal limits. The appendix is not clearly identified. No evidence of bowel wall thickening, distention, or inflammatory changes. Small noninflamed diverticula are seen within the proximal and mid sigmoid colon Vascular/Lymphatic: Aortic atherosclerosis. Multiple subcentimeter para-aortic and aortocaval lymph nodes are seen. Reproductive: Status post hysterectomy. No adnexal masses. Other: Multiple small surgical coils are seen along the midline of the anterior abdominal wall. No abdominopelvic ascites. Musculoskeletal: Multilevel degenerative changes seen throughout the lumbar spine. IMPRESSION: 1. Mild nodular appearing right lower lobe infiltrate and associated atelectatic changes. 2. Small right pleural effusion. 3. Evidence of prior cholecystectomy and prior hysterectomy. 4. Sigmoid diverticulosis. 5. Aortic atherosclerosis. Aortic Atherosclerosis (ICD10-I70.0).  Electronically Signed   By: Aram Candela M.D.   On: 02/02/2023 21:48   DG Chest Port 1 View  Result Date: 02/02/2023 CLINICAL DATA:  Sepsis.  Shortness of breath EXAM: PORTABLE CHEST 1 VIEW COMPARISON:  03/29/2018 FINDINGS: The heart size and mediastinal contours are within normal limits. Aortic atherosclerosis. Prominent interstitial markings throughout both lungs. No lobar consolidation. No pleural effusion or pneumothorax. The visualized skeletal structures are unremarkable. IMPRESSION: Prominent interstitial markings throughout both lungs, which may reflect edema or atypical/viral infection. Electronically Signed   By: Duanne Guess D.O.   On: 02/02/2023 20:51    Pending Labs Unresulted Labs (From admission, onward)    None       Vitals/Pain Today's Vitals   02/03/23 0630 02/03/23 0645 02/03/23 0700 02/03/23 0718  BP: 119/62 110/71 139/67 121/83  Pulse: (!) 108 99 (!) 109 (!) 113  Resp: (!) 33 (!) 28 (!) 25 20  Temp:    97.6 F (36.4 C)  TempSrc:    Axillary  SpO2: 96% 95% 94% 96%  Weight:      Height:      PainSc:    0-No pain    Isolation Precautions No active isolations  Medications Medications  lactated ringers infusion ( Intravenous New Bag/Given 02/03/23 0535)  diltiazem (CARDIZEM) 1 mg/mL load via infusion 10 mg (10 mg Intravenous Bolus from Bag 02/02/23 2003)    And  diltiazem (CARDIZEM) 125 mg in dextrose 5% 125 mL (1 mg/mL) infusion (12 mg/hr Intravenous Rate/Dose Change 02/03/23 0542)  azithromycin (ZITHROMAX) 500 mg in sodium chloride 0.9 % 250 mL IVPB (0 mg Intravenous Stopped 02/02/23 2146)  0.9 %  sodium chloride infusion (0 mLs Intravenous Hold 02/02/23 2050)  albuterol (PROVENTIL) (2.5 MG/3ML) 0.083% nebulizer solution 10 mg (0 mg Nebulization Stopped 02/02/23 2244)  norepinephrine (LEVOPHED) 4mg  in (0.016 mg/mL) premix infusion (5 mcg/min Intravenous New Bag/Given 02/03/23 0714)  albuterol (PROVENTIL) (2.5 MG/3ML) 0.083% nebulizer solution 10 mg  ( Nebulization Canceled Entry 02/02/23 2156)  cefTRIAXone (ROCEPHIN) 1 g in sodium chloride 0.9 % 100 mL IVPB (0 g Intravenous Stopped 02/02/23 2112)  sodium chloride 0.9 % bolus 1,000 mL (0 mLs Intravenous Stopped 02/02/23 2236)  sodium chloride 0.9 % bolus 1,000 mL (0 mLs Intravenous Stopped 02/02/23 2152)    Mobility walks with device     Focused Assessments Cardiac Assessment Handoff:    Lab Results  Component  Value Date   TROPONINI <0.03 05/08/2018   No results found for: "DDIMER" Does the Patient currently have chest pain? No    R Recommendations: See Admitting Provider Note  Report given to:   Additional Notes: new onset a-fib, monitoring troponin's, dx septic shock, levophed running to white lumen (5 mcg/min), cardziem running to brown lumen (12 mg/hr), LR continuous RT forearm/wrist (150 ml/hr), pt on Bi-Pap

## 2023-02-03 NOTE — Progress Notes (Signed)
Afternoon rounds: Arrived today from [facility] ED. Admitted to ICU. We repeated an ABG when she got here with pH 7.27, pCO2 68. We took her off BiPAP for a short period of time and she was able to tell me she does have sick neighbor with COVID that she saw a few days ago. Panel has been negative here but may be too early. She desatted on [place] after around 2hr off BiPAP requiring her to go back on. +steroids. Echo pending. Troponins peaked today. More broad RVP panel is negative. She is on heparin gtt now for afib RVR and stayed on cardizem gtt today with good rate control. Intermittently will flip into sinus rhythm. Has not required pressor support most of today.

## 2023-02-04 ENCOUNTER — Inpatient Hospital Stay (HOSPITAL_COMMUNITY): Payer: Medicare HMO

## 2023-02-04 ENCOUNTER — Other Ambulatory Visit (HOSPITAL_COMMUNITY): Payer: Self-pay

## 2023-02-04 ENCOUNTER — Telehealth: Payer: Self-pay | Admitting: Student

## 2023-02-04 DIAGNOSIS — A419 Sepsis, unspecified organism: Secondary | ICD-10-CM | POA: Diagnosis not present

## 2023-02-04 DIAGNOSIS — N179 Acute kidney failure, unspecified: Secondary | ICD-10-CM

## 2023-02-04 DIAGNOSIS — J9601 Acute respiratory failure with hypoxia: Secondary | ICD-10-CM | POA: Diagnosis not present

## 2023-02-04 DIAGNOSIS — I4891 Unspecified atrial fibrillation: Secondary | ICD-10-CM | POA: Diagnosis not present

## 2023-02-04 DIAGNOSIS — R6521 Severe sepsis with septic shock: Secondary | ICD-10-CM | POA: Diagnosis not present

## 2023-02-04 LAB — COMPREHENSIVE METABOLIC PANEL
ALT: 335 U/L — ABNORMAL HIGH (ref 0–44)
AST: 403 U/L — ABNORMAL HIGH (ref 15–41)
Albumin: 2.5 g/dL — ABNORMAL LOW (ref 3.5–5.0)
Alkaline Phosphatase: 122 U/L (ref 38–126)
Anion gap: 11 (ref 5–15)
BUN: 50 mg/dL — ABNORMAL HIGH (ref 8–23)
CO2: 29 mmol/L (ref 22–32)
Calcium: 8.8 mg/dL — ABNORMAL LOW (ref 8.9–10.3)
Chloride: 101 mmol/L (ref 98–111)
Creatinine, Ser: 1.85 mg/dL — ABNORMAL HIGH (ref 0.44–1.00)
GFR, Estimated: 28 mL/min — ABNORMAL LOW (ref >60)
Glucose, Bld: 135 mg/dL — ABNORMAL HIGH (ref 70–99)
Potassium: 4.7 mmol/L (ref 3.5–5.1)
Sodium: 141 mmol/L (ref 135–145)
Total Bilirubin: 0.8 mg/dL (ref 0.3–1.2)
Total Protein: 6.4 g/dL — ABNORMAL LOW (ref 6.5–8.1)

## 2023-02-04 LAB — ECHOCARDIOGRAM COMPLETE
AR max vel: 1.49 cm2
AV Area VTI: 1.49 cm2
AV Area mean vel: 1.57 cm2
AV Mean grad: 14 mmHg
AV Peak grad: 24.6 mmHg
Ao pk vel: 2.48 m/s
Area-P 1/2: 3.92 cm2
Calc EF: 67 %
Height: 64 in
S' Lateral: 2.4 cm
Single Plane A2C EF: 73.8 %
Single Plane A4C EF: 59.5 %
Weight: 2747.81 oz

## 2023-02-04 LAB — BLOOD GAS, VENOUS
Acid-Base Excess: 5.7 mmol/L — ABNORMAL HIGH (ref 0.0–2.0)
Bicarbonate: 35.4 mmol/L — ABNORMAL HIGH (ref 20.0–28.0)
O2 Saturation: 79.4 %
Patient temperature: 37
pCO2, Ven: 77 mmHg (ref 44–60)
pH, Ven: 7.27 (ref 7.25–7.43)
pO2, Ven: 46 mmHg — ABNORMAL HIGH (ref 32–45)

## 2023-02-04 LAB — CBC
HCT: 35.7 % — ABNORMAL LOW (ref 36.0–46.0)
Hemoglobin: 11.1 g/dL — ABNORMAL LOW (ref 12.0–15.0)
MCH: 34.5 pg — ABNORMAL HIGH (ref 26.0–34.0)
MCHC: 31.1 g/dL (ref 30.0–36.0)
MCV: 110.9 fL — ABNORMAL HIGH (ref 80.0–100.0)
Platelets: 230 10*3/uL (ref 150–400)
RBC: 3.22 MIL/uL — ABNORMAL LOW (ref 3.87–5.11)
RDW: 13.4 % (ref 11.5–15.5)
WBC: 37.4 10*3/uL — ABNORMAL HIGH (ref 4.0–10.5)
nRBC: 0.4 % — ABNORMAL HIGH (ref 0.0–0.2)

## 2023-02-04 LAB — MRSA NEXT GEN BY PCR, NASAL: MRSA by PCR Next Gen: NOT DETECTED

## 2023-02-04 LAB — GLUCOSE, CAPILLARY
Glucose-Capillary: 136 mg/dL — ABNORMAL HIGH (ref 70–99)
Glucose-Capillary: 138 mg/dL — ABNORMAL HIGH (ref 70–99)
Glucose-Capillary: 142 mg/dL — ABNORMAL HIGH (ref 70–99)
Glucose-Capillary: 148 mg/dL — ABNORMAL HIGH (ref 70–99)
Glucose-Capillary: 149 mg/dL — ABNORMAL HIGH (ref 70–99)

## 2023-02-04 LAB — HEPARIN LEVEL (UNFRACTIONATED)
Heparin Unfractionated: >1.1 IU/mL — ABNORMAL HIGH (ref 0.30–0.70)
Heparin Unfractionated: 0.87 IU/mL — ABNORMAL HIGH (ref 0.30–0.70)

## 2023-02-04 LAB — MAGNESIUM: Magnesium: 2.1 mg/dL (ref 1.7–2.4)

## 2023-02-04 MED ORDER — DILTIAZEM HCL 30 MG PO TABS
30.0000 mg | ORAL_TABLET | Freq: Four times a day (QID) | ORAL | Status: DC
  Administered 2023-02-04 (×2): 30 mg via ORAL
  Filled 2023-02-04 (×2): qty 1

## 2023-02-04 MED ORDER — DILTIAZEM HCL 60 MG PO TABS
60.0000 mg | ORAL_TABLET | Freq: Four times a day (QID) | ORAL | Status: DC
  Administered 2023-02-05 – 2023-02-10 (×20): 60 mg via ORAL
  Filled 2023-02-04 (×21): qty 1

## 2023-02-04 MED ORDER — TRAMADOL HCL 50 MG PO TABS
50.0000 mg | ORAL_TABLET | Freq: Two times a day (BID) | ORAL | Status: DC | PRN
  Administered 2023-02-05: 50 mg via ORAL
  Filled 2023-02-04 (×2): qty 1

## 2023-02-04 MED ORDER — PANTOPRAZOLE SODIUM 40 MG IV SOLR
40.0000 mg | INTRAVENOUS | Status: DC
  Administered 2023-02-04 – 2023-02-07 (×4): 40 mg via INTRAVENOUS
  Filled 2023-02-04 (×4): qty 10

## 2023-02-04 MED ORDER — HEPARIN (PORCINE) 25000 UT/250ML-% IV SOLN
700.0000 [IU]/h | INTRAVENOUS | Status: DC
  Administered 2023-02-04: 700 [IU]/h via INTRAVENOUS
  Filled 2023-02-04: qty 250

## 2023-02-04 MED ORDER — INFLUENZA VAC A&B SURF ANT ADJ 0.5 ML IM SUSY
0.5000 mL | PREFILLED_SYRINGE | INTRAMUSCULAR | Status: AC
  Administered 2023-02-05: 0.5 mL via INTRAMUSCULAR
  Filled 2023-02-04: qty 0.5

## 2023-02-04 MED ORDER — TRAMADOL HCL 50 MG PO TABS
50.0000 mg | ORAL_TABLET | Freq: Every day | ORAL | Status: DC
  Administered 2023-02-04 – 2023-02-11 (×7): 50 mg via ORAL
  Filled 2023-02-04 (×7): qty 1

## 2023-02-04 MED ORDER — APIXABAN 5 MG PO TABS
5.0000 mg | ORAL_TABLET | Freq: Two times a day (BID) | ORAL | Status: DC
  Administered 2023-02-04 – 2023-02-11 (×13): 5 mg via ORAL
  Filled 2023-02-04 (×13): qty 1

## 2023-02-04 MED ORDER — TRAZODONE HCL 50 MG PO TABS
50.0000 mg | ORAL_TABLET | Freq: Every day | ORAL | Status: DC
  Administered 2023-02-04 – 2023-02-10 (×6): 50 mg via ORAL
  Filled 2023-02-04 (×6): qty 1

## 2023-02-04 MED ORDER — HYDROCORTISONE SOD SUC (PF) 100 MG IJ SOLR
50.0000 mg | Freq: Two times a day (BID) | INTRAMUSCULAR | Status: DC
  Administered 2023-02-04 – 2023-02-07 (×6): 50 mg via INTRAVENOUS
  Filled 2023-02-04 (×6): qty 2

## 2023-02-04 NOTE — Progress Notes (Signed)
NAME:  Denise Underwood, MRN:  [id number], DOB:  August 17, 1945, LOS: 2 ADMISSION DATE:  02/02/2023, CONSULTATION DATE:  02/02/2023 REFERRING MD:  Eber Hong, MD EDP, CHIEF COMPLAINT:  acute respiratory failure    History of Present Illness:  77 year old female with past medical history of hypertension, hyperlipidemia, pre-diabetes, arthritis, leukocytosis, COPD, tobacco use who presented to [facility] emergency department on 02/02/23 for shortness of breath. Noted to have 1-2 days of worsening shortness of breath and found to have room air sat of 76% by EMS. Noted to be dyspneic and unable to even stand at home. In the ED, tachypneic, new atrial fibrillation with RVR,. She was placed on oxygen. Cardizem drip was started and the patient converted to normal sinus rhythm. Subsequently, she continued to be hypotensive and 2L IVF were ordered and eventually required vasopressor support. EDP placed central line. Additionally, her laboratory work up showed new WBC 34.5, Cl 93, BUN/Cr 46/ 2.21, AST 768, ALT 445, troponin 84>109>117, BNP 697, lactic wnl. UA without UTI. BCX sent. CXR showing edema vs atypical infection, CT AP showing RLL infilatrate, small right pleural effusion, no acute intraabdominal findings. ABG ordered showed pH 7.15, pCO2 96, pO2 161. Placed on BiPAP. Repeat 7.19/77/103. She was started on Rocephin, Zithromax.  PCCM was consulted for admission.   Pertinent  Medical History  hypertension, hyperlipidemia, pre-diabetes, arthritis, leukocytosis, COPD, tobacco use  Significant Hospital Events: Including procedures, antibiotic start and stop dates in addition to other pertinent events   9/23: admitted to [facility] 9/24: no acute events overnight. She was anxious per overnight notes and was eventually started on precedex gtt to help with BiPAP.   Interim History / Subjective:  Feels improved this morning, breathing comfortably on BiPAP  Objective   Blood pressure 107/67, pulse 89, temperature 98.4  F (36.9 C), temperature source Axillary, resp. rate (!) 22, height 5\' 4"  (1.626 m), weight 77.9 kg, SpO2 90%.    FiO2 (%):  [35 %-40 %] 35 % PEEP:  [5 cmH20] 5 cmH20 Pressure Support:  [12 cmH20] 12 cmH20   Intake/Output Summary (Last 24 hours) at 02/04/2023 0711 Last data filed at 02/04/2023 0653 Gross per 24 hour  Intake 1816.14 ml  Output 500 ml  Net 1316.14 ml   Filed Weights   02/02/23 1929 02/03/23 0930  Weight: 73 kg 77.9 kg    Examination: General: elderly appearing, chronically ill appearing, on BiPAP. Comfortable  HENT: limited by BiPAP but EOM intact, PERRLA  Lungs: diminished RLL Cardiovascular: s1/s2.  No murmur, rub, gallop. In NSR rate ~70s Abdomen: rounded, soft, non-tender, +BS Extremities: moves extremities equally well. No significant edema Neuro: alert, non focal exam. Oriented and able to speak 5 words or so at a time  GU: deferred  Resolved Hospital Problem list   Shock Assessment & Plan:  Sespis from community acquired pneumonia. CT demonstrating RLL infiltrate. RVP negative. UA not consistent with UTI. CTAP without intraabdominal source. CXR 02/04/23 with worsening RLL infiltrate  - con't rocephin, azithromycin for CAP - cultures NGTD - sputum culture if able to expectorate  - RVP, MRSA PCR, urine legionella/strep all negative - trend WBC, fever curve - improving   Acute hypercarbic respiratory failure 2/2 RLL PNA  Small RLL pleural effusion  Underlying COPD and tobacco use. - budesonide/brovana/yupelri  - Solu-Cortef 100mg  q12h - con't BiPAP, wean FiO2 with O2 sat >92% - pulmonary hygiene   Atrial Fibrillation, new; CHA2DS2VASc 5 Elevated troponin, BNP - likely demand. In NSR now  -  telemetry monitoring  - switch to oral Cardizem today. Off gtt  - transition to oral anticoagulant. Consulting TOC for Xarelto vs Eliquis  - Echo today   Elevated LFTs ; likely shock liver. Hepatitis panel negative. RUQ [place] without acute findings.  - trend CMP -  improving  - avoid hepatotoxic agents as able   Acute Kidney Injury secondary to septic shock. ATN. Improving - trend BMP, electrolytes - monitor urine output  - avoid nephrotoxic agents  Hyperglycemia  - CBG monitoring  - A1c check  - SSI initiation if continues   Hypertension  Hyperlipidemia - holding antihypertensives in setting of shock  - hold statin for now   Tobacco use  - counsel on cessation  - nicotine patch if needed   Best Practice (right click and "Reselect all SmartList Selections" daily)   Diet/type: NPO DVT prophylaxis: systemic heparin GI prophylaxis: PPI Lines: Central line and yes and it is still needed Foley:  N/A Code Status:  full code Last date of multidisciplinary goals of care discussion [discussed plan of care with patient at bedside on arrival]  Labs   CBC: Recent Labs  Lab 02/02/23 1949 02/03/23 1829 02/04/23 0459  WBC 34.5* 39.3* 37.4*  NEUTROABS 30.0* 37.7*  --   HGB 12.1 11.1* 11.1*  HCT 38.5 36.8 35.7*  MCV 110.0* 112.9* 110.9*  PLT 232 212 230    Basic Metabolic Panel: Recent Labs  Lab 02/02/23 1949 02/03/23 1029 02/03/23 1829 02/04/23 0459  NA 136  --  140 141  K 4.9  --  4.7 4.7  CL 93*  --  99 101  CO2 28  --  29 29  GLUCOSE 189*  --  171* 135*  BUN 46*  --  47* 50*  CREATININE 2.21*  --  2.18* 1.85*  CALCIUM 9.0  --  8.6* 8.8*  MG 2.1 1.9  --  2.1   GFR: Estimated Creatinine Clearance: 25.7 mL/min (A) (by C-G formula based on SCr of 1.85 mg/dL (H)). Recent Labs  Lab 02/02/23 1949 02/02/23 2159 02/03/23 1829 02/04/23 0459  WBC 34.5*  --  39.3* 37.4*  LATICACIDVEN 1.8 1.3  --   --     Liver Function Tests: Recent Labs  Lab 02/02/23 1949 02/03/23 1829 02/04/23 0459  AST 768* 601* 403*  ALT 445* 390* 335*  ALKPHOS 105 116 122  BILITOT 0.9 0.6 0.8  PROT 6.7 6.5 6.4*  ALBUMIN 3.0* 2.6* 2.5*   No results for input(s): "LIPASE", "AMYLASE" in the last 168 hours. No results for input(s): "AMMONIA" in  the last 168 hours.  ABG    Component Value Date/Time   PHART 7.27 (L) 02/03/2023 0955   PCO2ART 68 (HH) 02/03/2023 0955   PO2ART 92 02/03/2023 0955   HCO3 31.4 (H) 02/03/2023 0955   ACIDBASEDEF 0.9 02/03/2023 0132   O2SAT 99.7 02/03/2023 0955     Coagulation Profile: Recent Labs  Lab 02/02/23 1949  INR 1.2    Cardiac Enzymes: No results for input(s): "CKTOTAL", "CKMB", "CKMBINDEX", "TROPONINI" in the last 168 hours.  HbA1C: HB A1C (BAYER DCA - WAIVED)  Date/Time Value Ref Range Status  01/03/2022 10:15 AM 5.3 4.8 - 5.6 % Final    Comment:             Prediabetes: 5.7 - 6.4          Diabetes: >6.4          Glycemic control for adults with diabetes: <7.0   10/02/2021 09:58  AM 5.3 4.8 - 5.6 % Final    Comment:             Prediabetes: 5.7 - 6.4          Diabetes: >6.4          Glycemic control for adults with diabetes: <7.0    Hgb A1c MFr Bld  Date/Time Value Ref Range Status  02/03/2023 10:29 AM 6.2 (H) 4.8 - 5.6 % Final    Comment:    (NOTE) Pre diabetes:          5.7%-6.4%  Diabetes:              >6.4%  Glycemic control for   <7.0% adults with diabetes     CBG: Recent Labs  Lab 02/03/23 1257 02/03/23 1652 02/04/23 0026  GLUCAP 155* 167* 148*    Review of Systems:   Shortness of breath   Past Medical History:  She,  has a past medical history of Anxiety, Arthritis, Hyperlipidemia, Hypertension, Osteopenia (07/04/2021), Prediabetes (07/09/2021), and Psoriasis.   Surgical History:   Past Surgical History:  Procedure Laterality Date   ABDOMINAL HYSTERECTOMY     CHOLECYSTECTOMY     HERNIA REPAIR     KNEE SURGERY     TUBAL LIGATION       Social History:   reports that she has been smoking cigarettes. She has a 20 pack-year smoking history. She has never used smokeless tobacco. She reports that she does not drink alcohol and does not use drugs.   Family History:  Her family history is not on file.   Allergies No Known Allergies   Home  Medications  Prior to Admission medications   Medication Sig Start Date End Date Taking? Authorizing Provider  albuterol (VENTOLIN HFA) 108 (90 Base) MCG/ACT inhaler Inhale 2 puffs into the lungs every 6 (six) hours as needed. 06/21/20   Gwenlyn Fudge, FNP  CALCIUM PO Take by mouth daily.    [provider]  Cholecalciferol (VITAMIN D3) 1000 units CAPS Take 2,000 Units by mouth.    [provider]  diclofenac Sodium (VOLTAREN) 1 % GEL Apply 2 g topically 4 (four) times daily. 03/01/20   Delynn Flavin M, DO  Emollient (CERAVE DIABETICS DRY SKIN) CREA Apply 1 Application topically 2 (two) times daily. 06/19/22   Sonny Masters, FNP  escitalopram (LEXAPRO) 20 MG tablet Take 1 tablet (20 mg total) by mouth daily. 05/30/22   Sonny Masters, FNP  fluticasone (FLONASE) 50 MCG/ACT nasal spray Place 2 sprays into both nostrils daily. 04/30/22   Sonny Masters, FNP  hydrochlorothiazide (HYDRODIURIL) 25 MG tablet Take 1 tablet (25 mg total) by mouth daily as needed. 05/30/22   Sonny Masters, FNP  hydrocortisone 2.5 % cream Apply topically 2 (two) times daily. 10/23/18   Remus Loffler, PA-C  levocetirizine (XYZAL) 5 MG tablet Take 1 tablet (5 mg total) by mouth every evening. 11/19/22   Sonny Masters, FNP  lisinopril (ZESTRIL) 5 MG tablet Take 1 tablet (5 mg total) by mouth daily. 11/19/22   Sonny Masters, FNP  magnesium 30 MG tablet Take 400 mg by mouth daily at 6 (six) AM.    [provider]  meloxicam (MOBIC) 7.5 MG tablet Take 1 tablet (7.5 mg total) by mouth daily. 11/19/22   Sonny Masters, FNP  naloxone [facility]) nasal spray 4 mg/0.1 mL As needed or respiratory depression or decreased mental status related to narcotic use / overdose  03/28/22   Sonny Masters, FNP  nicotine (NICODERM CQ - DOSED IN MG/24 HOURS) 14 mg/24hr patch RX #2 Weeks 5-6: 14 mg x 1 patch daily. Wear for 24 hours. If you have sleep disturbances, remove at bedtime. 01/10/23   Cindie Crumbly, MD  nystatin  (MYCOSTATIN/NYSTOP) powder Apply 1 application. topically 3 (three) times daily. 10/02/21   Gwenlyn Fudge, FNP  rosuvastatin (CRESTOR) 20 MG tablet take 1 tablet once daily. 01/17/23   Sonny Masters, FNP  traMADol (ULTRAM) 50 MG tablet Take 2 tablets (100 mg total) by mouth every 12 (twelve) hours. 12/25/22   Sonny Masters, FNP  traMADol (ULTRAM) 50 MG tablet Take 2 tablets (100 mg total) by mouth 2 (two) times daily. 01/24/23   Sonny Masters, FNP  traMADol (ULTRAM) 50 MG tablet Take 2 tablets (100 mg total) by mouth 2 (two) times daily. 02/23/23   Sonny Masters, FNP  traZODone (DESYREL) 50 MG tablet TAKE 1 TABLET BY MOUTH AT BEDTIME. 01/17/23   Sonny Masters, FNP  triamcinolone (KENALOG) 0.025 % ointment APPLY 1 APPLICATION TOPICALLY 2 TIMES A DAY. 09/17/22   Sonny Masters, FNP     Critical care time: 64

## 2023-02-04 NOTE — Progress Notes (Signed)
Called by nursing  Had been in SR all day  Went into AF w/ RVR rate 140s. Currently back in AF but HR 90s-112 She was asymptomatic  Plan Will inc her cardiazem to 60 q 6 Cont tele    Simonne Martinet ACNP-BC [facility] Pulmonary/Critical Care Pager # [phone] OR # [phone] if no answer

## 2023-02-04 NOTE — Care Management (Addendum)
Transition of Care [facility]) - Inpatient Brief Assessment   Patient Details  Name: Denise Underwood MRN: [id number] Date of Birth: 03-Nov-1945  Transition of Care [facility]) CM/SW Contact:    Lavenia Atlas, RN Phone Number: 02/04/2023, 5:22 PM   Clinical Narrative: Per chart review patient from [facility] ICU for septic shock. TOC consult on file for NIV, and medication assistance: xarelto vs eliquis copay. Inpt pharmacy has reviewed indicating $0 copay for both.  TOC will continue to follow.   Transition of Care Asessment: Insurance and Status: Insurance coverage has been reviewed Patient has primary care physician: Yes Home environment has been reviewed: from home alone Prior level of function:: independent Prior/Current Home Services: No current home services Social Determinants of Health Reivew: SDOH reviewed no interventions necessary Readmission risk has been reviewed: Yes Transition of care needs: no transition of care needs at this time

## 2023-02-04 NOTE — Telephone Encounter (Signed)
LM to make HFU at [facility] office.

## 2023-02-04 NOTE — Progress Notes (Signed)
Afternoon rounds: Okay today off of BiPAP still on 3LNC. Labs generally improving, still with leukocytosis. Echo today without significant abnormalities. She has had rate control off diltiazem gtt and now on oral diltiazem. Transitioning to oral DOAC. We have consulted cardiology who will see her in the morning regarding new onset afib this hospitalization. Will continue CAP coverage for total of 5 days of treatment. We decreased solu-cortef 50mg  q12. Will need taper. We have also put in for pulmonary OP hospital follow-up for her COPD. Going to [facility] tomorrow.

## 2023-02-04 NOTE — Plan of Care (Signed)
  Problem: Nutrition: Goal: Adequate nutrition will be maintained Outcome: Progressing   Problem: Coping: Goal: Level of anxiety will decrease Outcome: Progressing   Problem: Activity: Goal: Risk for activity intolerance will decrease Outcome: Not Progressing

## 2023-02-04 NOTE — Progress Notes (Signed)
ANTICOAGULATION CONSULT NOTE   Pharmacy Consult for heparin > apixaban Indication: atrial fibrillation  No Known Allergies  Patient Measurements: Height: 5\' 4"  (162.6 cm) Weight: 77.9 kg (171 lb 11.8 oz) IBW/kg (Calculated) : 54.7  Vital Signs: Temp: 97.5 F (36.4 C) (09/24 1500) Temp Source: Oral (09/24 1500) BP: 140/70 (09/24 1500) Pulse Rate: 114 (09/24 1500)  Labs: Recent Labs    02/02/23 1949 02/02/23 2155 02/03/23 0210 02/03/23 1029 02/03/23 1251 02/03/23 1829 02/04/23 0459  HGB 12.1  --   --   --   --  11.1* 11.1*  HCT 38.5  --   --   --   --  36.8 35.7*  PLT 232  --   --   --   --  212 230  APTT 29  --   --   --   --   --   --   LABPROT 15.5*  --   --   --   --   --   --   INR 1.2  --   --   --   --   --   --   HEPARINUNFRC  --   --   --   --   --  0.71* >1.10*  CREATININE 2.21*  --   --   --   --  2.18* 1.85*  TROPONINIHS 84*   < > 117* 106* 102*  --   --    < > = values in this interval not displayed.    Estimated Creatinine Clearance: 25.7 mL/min (A) (by C-G formula based on SCr of 1.85 mg/dL (H)).   Medical History: Past Medical History:  Diagnosis Date   Anxiety    Arthritis    Hyperlipidemia    Hypertension    Osteopenia 07/04/2021   Prediabetes 07/09/2021   Psoriasis     Medications: Pt not on anticoagulants PTA  Assessment: Pt is a 9 yoF currently anticoagulated with heparin drip for new onset atrial fibrillation. Pharmacy consulted to transition to apixaban.   Today, 02/04/23 Heparin level = 0.87 remains supratherapeutic despite decreasing heparin infusion to 700 units/hr CBC: Hgb slightly low but stable, Plt WNL Confirmed with RN - no signs of bleeding SCr = 1.85, weight = 77 kg  Plan:  Stop UFH Initiate apixaban 5 mg PO BID  TOC Benefit Eligibility check requested. See note on 9/24 CBC with AM labs tomorrow.   Cindi Carbon, PharmD 02/04/23 4:18 PM

## 2023-02-04 NOTE — TOC Benefit Eligibility Note (Signed)
Patient Product/process development scientist completed.    The patient is insured through [employer]. Patient has Medicare and is not eligible for a copay card, but may be able to apply for patient assistance, if available.    Ran test claim for Eliquis 5 mg and the current 30 day co-pay is $0.00.  Ran test claim for Xarelto 20 mg and the current 30 day co-pay is $0.00.  This test claim was processed through [facility]- copay amounts may vary at other pharmacies due to pharmacy/plan contracts, or as the patient moves through the different stages of their insurance plan.     Roland Earl, CPHT Pharmacy Technician III Certified Patient Advocate [facility] Patient Advocate Team Direct Number: [phone]  Fax: [phone]

## 2023-02-04 NOTE — Progress Notes (Signed)
  Echocardiogram 2D Echocardiogram has been performed.  Denise Underwood 02/04/2023, 8:42 AM

## 2023-02-04 NOTE — Progress Notes (Signed)
ANTICOAGULATION CONSULT NOTE  Pharmacy Consult for Heparin Indication: atrial fibrillation  No Known Allergies  Patient Measurements: Height: 5\' 4"  (162.6 cm) Weight: 77.9 kg (171 lb 11.8 oz) IBW/kg (Calculated) : 54.7 Heparin Dosing Weight: 71 kg  Vital Signs: Temp: 98.4 F (36.9 C) (09/24 0030) Temp Source: Axillary (09/24 0030) BP: 98/59 (09/24 0615) Pulse Rate: 77 (09/24 0615)  Labs: Recent Labs    02/02/23 1949 02/02/23 2155 02/03/23 0210 02/03/23 1029 02/03/23 1251 02/03/23 1829 02/04/23 0459  HGB 12.1  --   --   --   --  11.1* 11.1*  HCT 38.5  --   --   --   --  36.8 35.7*  PLT 232  --   --   --   --  212 230  APTT 29  --   --   --   --   --   --   LABPROT 15.5*  --   --   --   --   --   --   INR 1.2  --   --   --   --   --   --   HEPARINUNFRC  --   --   --   --   --  0.71* >1.10*  CREATININE 2.21*  --   --   --   --  2.18* 1.85*  TROPONINIHS 84*   < > 117* 106* 102*  --   --    < > = values in this interval not displayed.    Estimated Creatinine Clearance: 25.7 mL/min (A) (by C-G formula based on SCr of 1.85 mg/dL (H)).   Medical History: Past Medical History:  Diagnosis Date   Anxiety    Arthritis    Hyperlipidemia    Hypertension    Osteopenia 07/04/2021   Prediabetes 07/09/2021   Psoriasis     Medications:  Scheduled:   arformoterol  15 mcg Nebulization BID   budesonide (PULMICORT) nebulizer solution  0.5 mg Nebulization BID   Chlorhexidine Gluconate Cloth  6 each Topical Daily   hydrocortisone sod succinate (SOLU-CORTEF) inj  100 mg Intravenous Q12H   nicotine  21 mg Transdermal Daily   nystatin   Topical BID   mouth rinse  15 mL Mouth Rinse 4 times per day   revefenacin  175 mcg Nebulization Daily   Infusions:   sodium chloride 10 mL/hr at 02/03/23 2210   azithromycin Stopped (02/03/23 2151)   cefTRIAXone (ROCEPHIN)  IV Stopped (02/03/23 1117)   dexmedetomidine (PRECEDEX) IV infusion 0.4 mcg/kg/hr (02/04/23 0249)   diltiazem  (CARDIZEM) infusion 5 mg/hr (02/03/23 2210)   heparin      Assessment: 18 yoF presented on 9/22 to [facility] ED with ShOB, AMS, and in Afib RVR.  She is now transferred to [facility]. Pharmacy is consulted to dose Heparin IV for AFib. No prior to admission anticoagulation noted.   Baseline coag: aPTT 29, INR 1.2 CBC: Hgb 12.1, Plt 232 Troponin: 84, 109, 117  02/04/2023: Heparin level >1.1 on IV heparin 950 units/hr.  Verified lab drawn from central ine & heparin infusing peripherally. CBC: Hg low/stable at 11.1, pltc WNL Scr elevated but trending down at 1.85 No bleeding or infusion related concerns reported by RN  Goal of Therapy:  Heparin level 0.3-0.7 units/ml Monitor platelets by anticoagulation protocol: Yes   Plan:  Hold heparin x1 hr Resume heparin IV infusion at 700 units/hr Heparin level 8 hours after restarting (~1530) Daily heparin level and CBC while on heparin  Junita Push, PharmD, BCPS 02/04/2023,6:22 AM

## 2023-02-05 DIAGNOSIS — N179 Acute kidney failure, unspecified: Secondary | ICD-10-CM

## 2023-02-05 DIAGNOSIS — I5032 Chronic diastolic (congestive) heart failure: Secondary | ICD-10-CM

## 2023-02-05 DIAGNOSIS — J9601 Acute respiratory failure with hypoxia: Secondary | ICD-10-CM

## 2023-02-05 DIAGNOSIS — K7200 Acute and subacute hepatic failure without coma: Secondary | ICD-10-CM | POA: Diagnosis present

## 2023-02-05 DIAGNOSIS — J9602 Acute respiratory failure with hypercapnia: Secondary | ICD-10-CM

## 2023-02-05 DIAGNOSIS — A419 Sepsis, unspecified organism: Principal | ICD-10-CM

## 2023-02-05 DIAGNOSIS — R6521 Severe sepsis with septic shock: Secondary | ICD-10-CM

## 2023-02-05 DIAGNOSIS — I4891 Unspecified atrial fibrillation: Secondary | ICD-10-CM

## 2023-02-05 DIAGNOSIS — I5033 Acute on chronic diastolic (congestive) heart failure: Secondary | ICD-10-CM | POA: Diagnosis not present

## 2023-02-05 LAB — COMPREHENSIVE METABOLIC PANEL
ALT: 265 U/L — ABNORMAL HIGH (ref 0–44)
AST: 215 U/L — ABNORMAL HIGH (ref 15–41)
Albumin: 2.5 g/dL — ABNORMAL LOW (ref 3.5–5.0)
Alkaline Phosphatase: 136 U/L — ABNORMAL HIGH (ref 38–126)
Anion gap: 10 (ref 5–15)
BUN: 54 mg/dL — ABNORMAL HIGH (ref 8–23)
CO2: 28 mmol/L (ref 22–32)
Calcium: 8.9 mg/dL (ref 8.9–10.3)
Chloride: 97 mmol/L — ABNORMAL LOW (ref 98–111)
Creatinine, Ser: 1.72 mg/dL — ABNORMAL HIGH (ref 0.44–1.00)
GFR, Estimated: 30 mL/min — ABNORMAL LOW (ref >60)
Glucose, Bld: 126 mg/dL — ABNORMAL HIGH (ref 70–99)
Potassium: 4.3 mmol/L (ref 3.5–5.1)
Sodium: 135 mmol/L (ref 135–145)
Total Bilirubin: 0.7 mg/dL (ref 0.3–1.2)
Total Protein: 6.2 g/dL — ABNORMAL LOW (ref 6.5–8.1)

## 2023-02-05 LAB — GLUCOSE, CAPILLARY
Glucose-Capillary: 110 mg/dL — ABNORMAL HIGH (ref 70–99)
Glucose-Capillary: 125 mg/dL — ABNORMAL HIGH (ref 70–99)

## 2023-02-05 MED ORDER — LEVALBUTEROL HCL 0.63 MG/3ML IN NEBU: 0.6300 mg | INHALATION_SOLUTION | Freq: Four times a day (QID) | RESPIRATORY_TRACT | Status: DC

## 2023-02-05 MED ORDER — OLANZAPINE 10 MG IM SOLR
2.5000 mg | Freq: Once | INTRAMUSCULAR | Status: AC | PRN
  Administered 2023-02-05: 2.5 mg via INTRAMUSCULAR
  Filled 2023-02-05: qty 10

## 2023-02-05 MED ORDER — METOPROLOL TARTRATE 12.5 MG HALF TABLET
12.5000 mg | ORAL_TABLET | Freq: Two times a day (BID) | ORAL | Status: DC
  Administered 2023-02-05 – 2023-02-07 (×4): 12.5 mg via ORAL
  Filled 2023-02-05 (×4): qty 1

## 2023-02-05 MED ORDER — FUROSEMIDE 10 MG/ML IJ SOLN
40.0000 mg | Freq: Once | INTRAMUSCULAR | Status: AC
  Administered 2023-02-05: 40 mg via INTRAVENOUS
  Filled 2023-02-05: qty 4

## 2023-02-05 MED ORDER — LEVALBUTEROL HCL 0.63 MG/3ML IN NEBU
0.6300 mg | INHALATION_SOLUTION | RESPIRATORY_TRACT | Status: DC | PRN
  Administered 2023-02-05 – 2023-02-07 (×2): 0.63 mg via RESPIRATORY_TRACT
  Filled 2023-02-05 (×2): qty 3

## 2023-02-05 MED ORDER — STERILE WATER FOR INJECTION IJ SOLN
INTRAMUSCULAR | Status: AC
  Administered 2023-02-05: 10 mL
  Filled 2023-02-05: qty 10

## 2023-02-05 NOTE — Progress Notes (Addendum)
[facility]                                                                              [facility], is a 77 y.o. female, DOB - 04/19/1946, ZOX:[id number] Admit date - 02/02/2023    Outpatient Primary MD for the patient is [name], [name], FNP  LOS - 3  days  Chief Complaint  Patient presents with   Shortness of Breath   Altered Mental Status       Brief summary   Patient is a 77 year old female with HTN, HLP, prediabetes, arthritis, COPD, tobacco use presented to [facility] ED on 02/02/2023 with shortness of breath.  Patient had noted 1 to 2 days of worsening shortness of breath, was found hypoxic with O2 sat 76% by EMS, dyspneic, tachypneic and unable to even stand at home. In ED, was found to have new atrial fibrillation with RVR, was placed on O2, Cardizem drip was started, patient converted to NSR. Subsequently noted to be hypotensive, received IV fluids and eventually required vasopressor support.  Patient was transferred and by PCCM to ICU on 02/03/2023  CTAP showed right lower lung infiltrates, small right pleural effusion.  ABG showed pH 7.15, pCO2 96, pO2 161, was placed on BiPAP and IV antibiotics. WBCs 34.5, creatinine 2.21, AST 760, ALT 445, troponin 84> 117.  BNP 697.  Significant Hospital Events:   9/23: admitted to [facility] 9/24: no acute events overnight. She was anxious per overnight notes and was eventually started on precedex gtt to help with BiPAP.   PCCM requested [facility] to assume care on 02/05/2023.  Assessment & Plan    Principal Problem:   Septic shock (HCC), community-acquired pneumonia -CT abdomen pelvis showed right lower lung infiltrates, UA negative for UTI, no intra-abdominal pathology. -Chest x-ray 02/04/2023 with worsening RLL infiltrates -Off vasopressors, blood cultures 9/22 negative so far -Flu, RSV, COVID-negative, respiratory virus panel negative, urine strept antigen negative, urine Legionella antigen pending -Continue IV  ceftriaxone, IV Zithromax   Active Problems:   Acute respiratory failure with hypoxia and hypercapnia (HCC) Community-acquired pneumonia, underlying COPD, tobacco use, RLL pleural effusion -Not on O2 at baseline -Currently on BiPAP overnight and O2 6 L during the day -Continue Pulmicort, Brovana,Yupelri, pulmonary hygiene, flutter valve - add xopenex nebs, discontinued albuterol due to tachycardia -Solu-Cortef 40 mg every 12 hours, transition to oral prednisone in a.m. -Will likely need BiPAP, nocturnal for home    New onset atrial fibrillation with RVR (HCC), elevated troponins ?Volume overload with acute diastolic CHF  -Patient was placed on Cardizem drip, was discontinued on 9/24 however overnight, was noted to be in A-fib with RVR, per CCM, Cardizem was increased to 60 mg every 6 hours -Started on apixaban -Elevated troponins likely due to demand ischemia from respiratory failure, septic shock -2D echo showed EF 60 to 65%, G1 DD, normal right ventricular systolic function -BNP was 697.0 on admission 9/22, will repeat again, may have acute diastolic CHF from A-fib with RVR I's and O's with 3.8 L positive, probably will benefit from diuresis trial with close monitoring of renal function.    Elevated acute transaminitis, shock liver -  Output 400 ml  Net 308.6 ml     Wt Readings from Last 3 Encounters:  02/05/23 78.1 kg  01/10/23 72.2 kg  12/25/22 72.7 kg     Exam General: Alert and oriented x 3, NAD, ill-appearing on BiPAP HEENT: unable to assess, on BiPAP Cardiovascular: Irregular irregular, tachycardia Respiratory: Diminished breath sounds throughout with faint wheezing Gastrointestinal: Soft, nontender, nondistended, + bowel sounds Ext: no pedal edema bilaterally Neuro: moving all 4 extremities spontaneously Skin: Dressing on the coccyx intact Psych: normal affect    Data Reviewed:  I have personally reviewed following labs    CBC Lab Results  Component Value Date   WBC 37.4 (H) 02/04/2023   RBC 3.22 (L) 02/04/2023   HGB 11.1 (L) 02/04/2023   HCT 35.7 (L) 02/04/2023   MCV 110.9 (H) 02/04/2023   MCH 34.5 (H) 02/04/2023   PLT 230 02/04/2023   MCHC 31.1 02/04/2023   RDW 13.4 02/04/2023   LYMPHSABS 0.4 (L) 02/03/2023   MONOABS 1.2 (H) 02/03/2023   EOSABS 0.0 02/03/2023   BASOSABS 0.0 02/03/2023     Last metabolic panel Lab Results  Component Value Date   NA 135 02/05/2023   K 4.3 02/05/2023   CL 97 (L) 02/05/2023   CO2 28 02/05/2023   BUN 54 (H) 02/05/2023   CREATININE 1.72 (H) 02/05/2023   GLUCOSE 126 (H) 02/05/2023   GFRNONAA 30 (L) 02/05/2023   GFRAA 74 06/21/2020   CALCIUM 8.9 02/05/2023   PROT 6.2 (L) 02/05/2023   ALBUMIN 2.5 (L) 02/05/2023   LABGLOB 2.3 12/25/2022   AGRATIO 1.5 05/30/2022   BILITOT 0.7 02/05/2023   ALKPHOS 136 (H) 02/05/2023   AST 215 (H) 02/05/2023   ALT 265 (H) 02/05/2023   ANIONGAP  10 02/05/2023    CBG (last 3)  Recent Labs    02/04/23 1138 02/04/23 1753 02/04/23 2348  GLUCAP 136* 138* 149*      Coagulation Profile: Recent Labs  Lab 02/02/23 1949  INR 1.2     Radiology Studies: I have personally reviewed the imaging studies  DG Chest Port 1 View  Result Date: 02/04/2023 CLINICAL DATA:  Provided history: Pneumonia. EXAM: PORTABLE CHEST 1 VIEW COMPARISON:  Chest radiographs 02/03/2023 and earlier FINDINGS: Right IJ approach central venous catheter with tip at the level of the lower SVC. The cardiomediastinal silhouette is unchanged. Aortic atherosclerosis. Prominence of the interstitial lung markings. Superimposed ill-defined opacities within the right lung base, progressed from the prior chest radiograph of 02/03/2023. No evidence of pleural effusion or pneumothorax. Thoracic spondylosis. IMPRESSION: 1. Persistent prominence of the interstitial lung markings, which may reflect interstitial edema or atypical infection. 2. Superimposed ill-defined opacities within the right lung base, slightly progressed. This may reflect atelectasis and/or pneumonia. 3. Aortic Atherosclerosis (ICD10-I70.0). Electronically Signed   By: Jackey Underwood D.O.   On: 02/04/2023 10:22   ECHOCARDIOGRAM COMPLETE  Result Date: 02/04/2023    ECHOCARDIOGRAM REPORT   [name] Date of Exam: 02/04/2023 Medical Rec #:  [id number]      Height:       64.0 in Accession #:    [id number]     Weight:       171.7 lb Date of Birth:  03/26/1946      BSA:          1.834 m Patient Age:    76 years       BP:           107/67  Output 400 ml  Net 308.6 ml     Wt Readings from Last 3 Encounters:  02/05/23 78.1 kg  01/10/23 72.2 kg  12/25/22 72.7 kg     Exam General: Alert and oriented x 3, NAD, ill-appearing on BiPAP HEENT: unable to assess, on BiPAP Cardiovascular: Irregular irregular, tachycardia Respiratory: Diminished breath sounds throughout with faint wheezing Gastrointestinal: Soft, nontender, nondistended, + bowel sounds Ext: no pedal edema bilaterally Neuro: moving all 4 extremities spontaneously Skin: Dressing on the coccyx intact Psych: normal affect    Data Reviewed:  I have personally reviewed following labs    CBC Lab Results  Component Value Date   WBC 37.4 (H) 02/04/2023   RBC 3.22 (L) 02/04/2023   HGB 11.1 (L) 02/04/2023   HCT 35.7 (L) 02/04/2023   MCV 110.9 (H) 02/04/2023   MCH 34.5 (H) 02/04/2023   PLT 230 02/04/2023   MCHC 31.1 02/04/2023   RDW 13.4 02/04/2023   LYMPHSABS 0.4 (L) 02/03/2023   MONOABS 1.2 (H) 02/03/2023   EOSABS 0.0 02/03/2023   BASOSABS 0.0 02/03/2023     Last metabolic panel Lab Results  Component Value Date   NA 135 02/05/2023   K 4.3 02/05/2023   CL 97 (L) 02/05/2023   CO2 28 02/05/2023   BUN 54 (H) 02/05/2023   CREATININE 1.72 (H) 02/05/2023   GLUCOSE 126 (H) 02/05/2023   GFRNONAA 30 (L) 02/05/2023   GFRAA 74 06/21/2020   CALCIUM 8.9 02/05/2023   PROT 6.2 (L) 02/05/2023   ALBUMIN 2.5 (L) 02/05/2023   LABGLOB 2.3 12/25/2022   AGRATIO 1.5 05/30/2022   BILITOT 0.7 02/05/2023   ALKPHOS 136 (H) 02/05/2023   AST 215 (H) 02/05/2023   ALT 265 (H) 02/05/2023   ANIONGAP  10 02/05/2023    CBG (last 3)  Recent Labs    02/04/23 1138 02/04/23 1753 02/04/23 2348  GLUCAP 136* 138* 149*      Coagulation Profile: Recent Labs  Lab 02/02/23 1949  INR 1.2     Radiology Studies: I have personally reviewed the imaging studies  DG Chest Port 1 View  Result Date: 02/04/2023 CLINICAL DATA:  Provided history: Pneumonia. EXAM: PORTABLE CHEST 1 VIEW COMPARISON:  Chest radiographs 02/03/2023 and earlier FINDINGS: Right IJ approach central venous catheter with tip at the level of the lower SVC. The cardiomediastinal silhouette is unchanged. Aortic atherosclerosis. Prominence of the interstitial lung markings. Superimposed ill-defined opacities within the right lung base, progressed from the prior chest radiograph of 02/03/2023. No evidence of pleural effusion or pneumothorax. Thoracic spondylosis. IMPRESSION: 1. Persistent prominence of the interstitial lung markings, which may reflect interstitial edema or atypical infection. 2. Superimposed ill-defined opacities within the right lung base, slightly progressed. This may reflect atelectasis and/or pneumonia. 3. Aortic Atherosclerosis (ICD10-I70.0). Electronically Signed   By: Jackey Underwood D.O.   On: 02/04/2023 10:22   ECHOCARDIOGRAM COMPLETE  Result Date: 02/04/2023    ECHOCARDIOGRAM REPORT   [name] Date of Exam: 02/04/2023 Medical Rec #:  [id number]      Height:       64.0 in Accession #:    [id number]     Weight:       171.7 lb Date of Birth:  03/26/1946      BSA:          1.834 m Patient Age:    76 years       BP:           107/67  Output 400 ml  Net 308.6 ml     Wt Readings from Last 3 Encounters:  02/05/23 78.1 kg  01/10/23 72.2 kg  12/25/22 72.7 kg     Exam General: Alert and oriented x 3, NAD, ill-appearing on BiPAP HEENT: unable to assess, on BiPAP Cardiovascular: Irregular irregular, tachycardia Respiratory: Diminished breath sounds throughout with faint wheezing Gastrointestinal: Soft, nontender, nondistended, + bowel sounds Ext: no pedal edema bilaterally Neuro: moving all 4 extremities spontaneously Skin: Dressing on the coccyx intact Psych: normal affect    Data Reviewed:  I have personally reviewed following labs    CBC Lab Results  Component Value Date   WBC 37.4 (H) 02/04/2023   RBC 3.22 (L) 02/04/2023   HGB 11.1 (L) 02/04/2023   HCT 35.7 (L) 02/04/2023   MCV 110.9 (H) 02/04/2023   MCH 34.5 (H) 02/04/2023   PLT 230 02/04/2023   MCHC 31.1 02/04/2023   RDW 13.4 02/04/2023   LYMPHSABS 0.4 (L) 02/03/2023   MONOABS 1.2 (H) 02/03/2023   EOSABS 0.0 02/03/2023   BASOSABS 0.0 02/03/2023     Last metabolic panel Lab Results  Component Value Date   NA 135 02/05/2023   K 4.3 02/05/2023   CL 97 (L) 02/05/2023   CO2 28 02/05/2023   BUN 54 (H) 02/05/2023   CREATININE 1.72 (H) 02/05/2023   GLUCOSE 126 (H) 02/05/2023   GFRNONAA 30 (L) 02/05/2023   GFRAA 74 06/21/2020   CALCIUM 8.9 02/05/2023   PROT 6.2 (L) 02/05/2023   ALBUMIN 2.5 (L) 02/05/2023   LABGLOB 2.3 12/25/2022   AGRATIO 1.5 05/30/2022   BILITOT 0.7 02/05/2023   ALKPHOS 136 (H) 02/05/2023   AST 215 (H) 02/05/2023   ALT 265 (H) 02/05/2023   ANIONGAP  10 02/05/2023    CBG (last 3)  Recent Labs    02/04/23 1138 02/04/23 1753 02/04/23 2348  GLUCAP 136* 138* 149*      Coagulation Profile: Recent Labs  Lab 02/02/23 1949  INR 1.2     Radiology Studies: I have personally reviewed the imaging studies  DG Chest Port 1 View  Result Date: 02/04/2023 CLINICAL DATA:  Provided history: Pneumonia. EXAM: PORTABLE CHEST 1 VIEW COMPARISON:  Chest radiographs 02/03/2023 and earlier FINDINGS: Right IJ approach central venous catheter with tip at the level of the lower SVC. The cardiomediastinal silhouette is unchanged. Aortic atherosclerosis. Prominence of the interstitial lung markings. Superimposed ill-defined opacities within the right lung base, progressed from the prior chest radiograph of 02/03/2023. No evidence of pleural effusion or pneumothorax. Thoracic spondylosis. IMPRESSION: 1. Persistent prominence of the interstitial lung markings, which may reflect interstitial edema or atypical infection. 2. Superimposed ill-defined opacities within the right lung base, slightly progressed. This may reflect atelectasis and/or pneumonia. 3. Aortic Atherosclerosis (ICD10-I70.0). Electronically Signed   By: Jackey [facility]wood D.O.   On: 02/04/2023 10:22   ECHOCARDIOGRAM COMPLETE  Result Date: 02/04/2023    ECHOCARDIOGRAM REPORT   [name] Date of Exam: 02/04/2023 Medical Rec #:  [id number]      Height:       64.0 in Accession #:    [id number]     Weight:       171.7 lb Date of Birth:  03/26/1946      BSA:          1.834 m Patient Age:    76 years       BP:           107/67  [facility]                                                                              [facility]ise [facility], is a 77 y.o. female, DOB - 04/19/1946, ZOX:[id number] Admit date - 02/02/2023    Outpatient Primary MD for the patient is [name], [name], FNP  LOS - 3  days  Chief Complaint  Patient presents with   Shortness of Breath   Altered Mental Status       Brief summary   Patient is a 77 year old female with HTN, HLP, prediabetes, arthritis, COPD, tobacco use presented to [facility] ED on 02/02/2023 with shortness of breath.  Patient had noted 1 to 2 days of worsening shortness of breath, was found hypoxic with O2 sat 76% by EMS, dyspneic, tachypneic and unable to even stand at home. In ED, was found to have new atrial fibrillation with RVR, was placed on O2, Cardizem drip was started, patient converted to NSR. Subsequently noted to be hypotensive, received IV fluids and eventually required vasopressor support.  Patient was transferred and by PCCM to ICU on 02/03/2023  CTAP showed right lower lung infiltrates, small right pleural effusion.  ABG showed pH 7.15, pCO2 96, pO2 161, was placed on BiPAP and IV antibiotics. WBCs 34.5, creatinine 2.21, AST 760, ALT 445, troponin 84> 117.  BNP 697.  Significant Hospital Events:   9/23: admitted to [facility] 9/24: no acute events overnight. She was anxious per overnight notes and was eventually started on precedex gtt to help with BiPAP.   PCCM requested [facility] to assume care on 02/05/2023.  Assessment & Plan    Principal Problem:   Septic shock (HCC), community-acquired pneumonia -CT abdomen pelvis showed right lower lung infiltrates, UA negative for UTI, no intra-abdominal pathology. -Chest x-ray 02/04/2023 with worsening RLL infiltrates -Off vasopressors, blood cultures 9/22 negative so far -Flu, RSV, COVID-negative, respiratory virus panel negative, urine strept antigen negative, urine Legionella antigen pending -Continue IV  ceftriaxone, IV Zithromax   Active Problems:   Acute respiratory failure with hypoxia and hypercapnia (HCC) Community-acquired pneumonia, underlying COPD, tobacco use, RLL pleural effusion -Not on O2 at baseline -Currently on BiPAP overnight and O2 6 L during the day -Continue Pulmicort, Brovana,Yupelri, pulmonary hygiene, flutter valve - add xopenex nebs, discontinued albuterol due to tachycardia -Solu-Cortef 40 mg every 12 hours, transition to oral prednisone in a.m. -Will likely need BiPAP, nocturnal for home    New onset atrial fibrillation with RVR (HCC), elevated troponins ?Volume overload with acute diastolic CHF  -Patient was placed on Cardizem drip, was discontinued on 9/24 however overnight, was noted to be in A-fib with RVR, per CCM, Cardizem was increased to 60 mg every 6 hours -Started on apixaban -Elevated troponins likely due to demand ischemia from respiratory failure, septic shock -2D echo showed EF 60 to 65%, G1 DD, normal right ventricular systolic function -BNP was 697.0 on admission 9/22, will repeat again, may have acute diastolic CHF from A-fib with RVR I's and O's with 3.8 L positive, probably will benefit from diuresis trial with close monitoring of renal function.    Elevated acute transaminitis, shock liver -  [facility]                                                                              [facility], is a 77 y.o. female, DOB - 04/19/1946, ZOX:[id number] Admit date - 02/02/2023    Outpatient Primary MD for the patient is [name], [name], FNP  LOS - 3  days  Chief Complaint  Patient presents with   Shortness of Breath   Altered Mental Status       Brief summary   Patient is a 77 year old female with HTN, HLP, prediabetes, arthritis, COPD, tobacco use presented to [facility] on 02/02/2023 with shortness of breath.  Patient had noted 1 to 2 days of worsening shortness of breath, was found hypoxic with O2 sat 76% by EMS, dyspneic, tachypneic and unable to even stand at home. In ED, was found to have new atrial fibrillation with RVR, was placed on O2, Cardizem drip was started, patient converted to NSR. Subsequently noted to be hypotensive, received IV fluids and eventually required vasopressor support.  Patient was transferred and by PCCM to ICU on 02/03/2023  CTAP showed right lower lung infiltrates, small right pleural effusion.  ABG showed pH 7.15, pCO2 96, pO2 161, was placed on BiPAP and IV antibiotics. WBCs 34.5, creatinine 2.21, AST 760, ALT 445, troponin 84> 117.  BNP 697.  Significant Hospital Events:   9/23: admitted to [facility] 9/24: no acute events overnight. She was anxious per overnight notes and was eventually started on precedex gtt to help with BiPAP.   PCCM requested [facility] to assume care on 02/05/2023.  Assessment & Plan    Principal Problem:   Septic shock (HCC), community-acquired pneumonia -CT abdomen pelvis showed right lower lung infiltrates, UA negative for UTI, no intra-abdominal pathology. -Chest x-ray 02/04/2023 with worsening RLL infiltrates -Off vasopressors, blood cultures 9/22 negative so far -Flu, RSV, COVID-negative, respiratory virus panel negative, urine strept antigen negative, urine Legionella antigen pending -Continue IV  ceftriaxone, IV Zithromax   Active Problems:   Acute respiratory failure with hypoxia and hypercapnia (HCC) Community-acquired pneumonia, underlying COPD, tobacco use, RLL pleural effusion -Not on O2 at baseline -Currently on BiPAP overnight and O2 6 L during the day -Continue Pulmicort, Brovana,Yupelri, pulmonary hygiene, flutter valve - add xopenex nebs, discontinued albuterol due to tachycardia -Solu-Cortef 40 mg every 12 hours, transition to oral prednisone in a.m. -Will likely need BiPAP, nocturnal for home    New onset atrial fibrillation with RVR (HCC), elevated troponins ?Volume overload with acute diastolic CHF  -Patient was placed on Cardizem drip, was discontinued on 9/24 however overnight, was noted to be in A-fib with RVR, per CCM, Cardizem was increased to 60 mg every 6 hours -Started on apixaban -Elevated troponins likely due to demand ischemia from respiratory failure, septic shock -2D echo showed EF 60 to 65%, G1 DD, normal right ventricular systolic function -BNP was 697.0 on admission 9/22, will repeat again, may have acute diastolic CHF from A-fib with RVR I's and O's with 3.8 L positive, probably will benefit from diuresis trial with close monitoring of renal function.    Elevated acute transaminitis, shock liver -

## 2023-02-05 NOTE — Progress Notes (Signed)
NAME:  Denise Underwood, MRN:  [id number], DOB:  November 18, 1945, LOS: 3 ADMISSION DATE:  02/02/2023, CONSULTATION DATE:  02/02/2023 REFERRING MD:  Eber Hong, MD EDP, CHIEF COMPLAINT:  acute respiratory failure    History of Present Illness:  77 year old female with past medical history of hypertension, hyperlipidemia, pre-diabetes, arthritis, leukocytosis, COPD, tobacco use who presented to [facility] emergency department on 02/02/23 for shortness of breath. Noted to have 1-2 days of worsening shortness of breath and found to have room air sat of 76% by EMS. Noted to be dyspneic and unable to even stand at home. In the ED, tachypneic, new atrial fibrillation with RVR,. She was placed on oxygen. Cardizem drip was started and the patient converted to normal sinus rhythm. Subsequently, she continued to be hypotensive and 2L IVF were ordered and eventually required vasopressor support. EDP placed central line. Additionally, her laboratory work up showed new WBC 34.5, Cl 93, BUN/Cr 46/ 2.21, AST 768, ALT 445, troponin 84>109>117, BNP 697, lactic wnl. UA without UTI. BCX sent. CXR showing edema vs atypical infection, CT AP showing RLL infilatrate, small right pleural effusion, no acute intraabdominal findings. ABG ordered showed pH 7.15, pCO2 96, pO2 161. Placed on BiPAP. Repeat 7.19/77/103. She was started on Rocephin, Zithromax.  PCCM was consulted for admission.   Pertinent  Medical History  hypertension, hyperlipidemia, pre-diabetes, arthritis, leukocytosis, COPD, tobacco use  Significant Hospital Events: Including procedures, antibiotic start and stop dates in addition to other pertinent events   9/23: admitted to [facility] 9/24: no acute events overnight. She was anxious per overnight notes and was eventually started on precedex gtt to help with BiPAP.   Interim History / Subjective:   Used BiPAP overnight. On nasal cannula this morning, likely needing 6 to 7 L. Confused per RN Afebrile 1 episode of dark  bloody sputum  Objective   Blood pressure (!) 134/90, pulse (!) 109, temperature 98.3 F (36.8 C), temperature source Oral, resp. rate (!) 24, height 5\' 4"  (1.626 m), weight 78.1 kg, SpO2 96%.    FiO2 (%):  [35 %-36 %] 35 % PEEP:  [5 cmH20] 5 cmH20 Pressure Support:  [12 cmH20] 12 cmH20   Intake/Output Summary (Last 24 hours) at 02/05/2023 1129 Last data filed at 02/05/2023 0800 Gross per 24 hour  Intake 708.6 ml  Output 400 ml  Net 308.6 ml   Filed Weights   02/03/23 0930 02/04/23 0700 02/05/23 0703  Weight: 77.9 kg 77.9 kg 78.1 kg    Examination: General: elderly appearing, chronically ill appearing, no distress HENT:  EOM intact, PERRLA , no pallor or icterus, dried blood around right nare Lungs: diminished RLL Cardiovascular: s1/s2.  No murmur, rub, gallop. In NSR rate  Abdomen: soft, non-tender, +BS Extremities: moves extremities equally well. No significant edema Neuro: alert, non focal , no asterixis, follows one-step commands   Labs show normal electrolytes, BUN/creatinine decreasing 54/1.7, decreasing LFTs   Resolved Hospital Problem list   Shock Assessment & Plan:  Sespis from community acquired pneumonia. CT demonstrating RLL infiltrate. RVP negative. UA not consistent with UTI. CTAP without intraabdominal source. CXR 02/04/23 with worsening RLL infiltrate  RVP, MRSA PCR, urine legionella/strep all negative - con't rocephin, azithromycin x 5 ds - sputum culture if able to expectorate  - trend WBC, fever curve    Acute hypoxic and hypercarbic respiratory failure 2/2 RLL PNA  Small RLL pleural effusion  Underlying COPD and tobacco use. - budesonide/brovana/yupelri  - Solu-Cortef 100mg  q12h - con't noct BiPAP,  daytime as needed - wean FiO2 with O2 sat >92% - pulmonary hygiene  -She may qualify for home NIV  Atrial Fibrillation, new; CHA2DS2VASc 5 Elevated troponin, BNP - likely demand. In NSR now  Echo with normal LV, enlarged RV - telemetry monitoring  -   oral Cardizem  -On Eliquis, monitor for worsening hemoptysis   Elevated LFTs ; likely shock liver. Hepatitis panel negative. RUQ [place] without acute findings.  - trend CMP - improving  - avoid hepatotoxic agents as able  -Hold statin  Acute Kidney Injury secondary to septic shock. ATN. Improving - trend BMP, electrolytes - monitor urine output  - avoid nephrotoxic agents  Hyperglycemia  - CBG monitoring  - A1c check  - SSI initiation if continues   Tobacco use  - counsel on cessation  - nicotine patch if needed   She may qualify for home NIV, will provide pulmonary office follow-up at discharge  Best Practice (right click and "Reselect all SmartList Selections" daily)   Per TRH Code Status:  full code Last date of multidisciplinary goals of care discussion [discussed plan of care with patient at bedside on arrival]  Labs   CBC: Recent Labs  Lab 02/02/23 1949 02/03/23 1829 02/04/23 0459  WBC 34.5* 39.3* 37.4*  NEUTROABS 30.0* 37.7*  --   HGB 12.1 11.1* 11.1*  HCT 38.5 36.8 35.7*  MCV 110.0* 112.9* 110.9*  PLT 232 212 230    Basic Metabolic Panel: Recent Labs  Lab 02/02/23 1949 02/03/23 1029 02/03/23 1829 02/04/23 0459 02/05/23 0510  NA 136  --  140 141 135  K 4.9  --  4.7 4.7 4.3  CL 93*  --  99 101 97*  CO2 28  --  29 29 28   GLUCOSE 189*  --  171* 135* 126*  BUN 46*  --  47* 50* 54*  CREATININE 2.21*  --  2.18* 1.85* 1.72*  CALCIUM 9.0  --  8.6* 8.8* 8.9  MG 2.1 1.9  --  2.1  --    GFR: Estimated Creatinine Clearance: 27.7 mL/min (A) (by C-G formula based on SCr of 1.72 mg/dL (H)). Recent Labs  Lab 02/02/23 1949 02/02/23 2159 02/03/23 1829 02/04/23 0459  WBC 34.5*  --  39.3* 37.4*  LATICACIDVEN 1.8 1.3  --   --     Liver Function Tests: Recent Labs  Lab 02/02/23 1949 02/03/23 1829 02/04/23 0459 02/05/23 0510  AST 768* 601* 403* 215*  ALT 445* 390* 335* 265*  ALKPHOS 105 116 122 136*  BILITOT 0.9 0.6 0.8 0.7  PROT 6.7 6.5 6.4* 6.2*   ALBUMIN 3.0* 2.6* 2.5* 2.5*   No results for input(s): "LIPASE", "AMYLASE" in the last 168 hours. No results for input(s): "AMMONIA" in the last 168 hours.  ABG    Component Value Date/Time   PHART 7.27 (L) 02/03/2023 0955   PCO2ART 68 (HH) 02/03/2023 0955   PO2ART 92 02/03/2023 0955   HCO3 35.4 (H) 02/04/2023 0750   ACIDBASEDEF 0.9 02/03/2023 0132   O2SAT 79.4 02/04/2023 0750     Coagulation Profile: Recent Labs  Lab 02/02/23 1949  INR 1.2    Cardiac Enzymes: No results for input(s): "CKTOTAL", "CKMB", "CKMBINDEX", "TROPONINI" in the last 168 hours.  HbA1C: HB A1C (BAYER DCA - WAIVED)  Date/Time Value Ref Range Status  01/03/2022 10:15 AM 5.3 4.8 - 5.6 % Final    Comment:             Prediabetes: 5.7 - 6.4  Diabetes: >6.4          Glycemic control for adults with diabetes: <7.0   10/02/2021 09:58 AM 5.3 4.8 - 5.6 % Final    Comment:             Prediabetes: 5.7 - 6.4          Diabetes: >6.4          Glycemic control for adults with diabetes: <7.0    Hgb A1c MFr Bld  Date/Time Value Ref Range Status  02/03/2023 10:29 AM 6.2 (H) 4.8 - 5.6 % Final    Comment:    (NOTE) Pre diabetes:          5.7%-6.4%  Diabetes:              >6.4%  Glycemic control for   <7.0% adults with diabetes     CBG: Recent Labs  Lab 02/04/23 0026 02/04/23 0921 02/04/23 1138 02/04/23 1753 02/04/23 2348  GLUCAP 148* 142* 136* 138* 149*   Cyril Mourning MD. FCCP. [facility] Pager : [phone]  If no response to pager , please call [phone] until 7 pm After 7:00 pm call Elink  [phone]   02/05/2023

## 2023-02-05 NOTE — Progress Notes (Signed)
While changed the bed, pt showed blood clot on the finger, told from throat, check mouth, red tongue, no injury or scar noticed.

## 2023-02-05 NOTE — Plan of Care (Signed)
Problem: Clinical Measurements: Goal: Diagnostic test results will improve Outcome: Progressing Goal: Respiratory complications will improve Outcome: Progressing Goal: Cardiovascular complication will be avoided Outcome: Progressing

## 2023-02-05 NOTE — Progress Notes (Signed)
PT Cancellation Note  Patient Details Name: Denise Underwood MRN: [id number] DOB: [birth year]   Cancelled Treatment:    Reason Eval/Treat Not Completed: Medical issues which prohibited therapy; pt with elevated diastolic BP, RR 30 and HR 114 at rest. Cardiology Consult pending regarding new onset afib, defer PT eval this am, continue efforts to see pt as schedule and medical issues allow.    [facility] 02/05/2023, 8:58 AM

## 2023-02-05 NOTE — Consult Note (Addendum)
Cardiology Consultation   Patient ID: Denise Underwood MRN: [id number]; DOB: 1945-12-25  Admit date: 02/02/2023 Date of Consult: 02/05/2023  PCP:  Sonny Masters, FNP   [facility] Providers Cardiologist:  None      Patient Profile:   Denise Underwood is a 77 y.o. female with a hx of HTN, HLD, prediabetes, arthritis, COPD, tobacco use who is being seen 02/05/2023 for the evaluation of atrial fibrillation at the request of Dr. Isidoro Donning.  History of Present Illness:   Denise Underwood is a 77 year old female with above medical history.  Per chart review, patient does not follow with cardiology.  Does not appear to have any past cardiac history.  Patient presented to the ED at [facility] on 9/22 with worsening altered mental status, shortness of breath, tachycardia.  At the scene, patient was found to be 75% on room air.  In the ED, patient was found to be in new atrial fibrillation with HR in the 160s.  She was started on oxygen and Cardizem drip. Patient became hypotensive requiring vasopressor support.  Had a central line placed in the ER.  Lab work showed WBC elevated to 34.5.  Troponin 84, 109, 117.  BNP elevated to 697.  Chest x-ray showed mild bilateral interstitial opacities that could be due to pulmonary edema versus atypical infection.  She was started on BiPAP, Rocephin, Zithromax.  Critical care was consulted for admission to [facility].  With a working diagnosis of shock, likely secondary to sepsis from community-acquired pneumonia.  Patient underwent echocardiogram on 9/24 that showed EF 60-65%, grade 1 diastolic dysfunction, normal RV function, normal pulmonary artery systolic pressure, mild biatrial dilation.  Chest x-ray on 01/31/2023 showed persistent prominence of the interstitial lung markings, may reflect interstitial edema or atypical infection.  Also showed superimposed ill-defined opacities in the right lung base that had slightly progressed.  Patient was in normal sinus rhythm and was  transition from Cardizem gtt. to oral Cardizem on 9/24.  She was transitioned to oral Eliquis 5 mg twice daily.  On the evening of 9/24, patient went into A-fib with RVR.  Heart rate up to the 140s, but improved to the 90s-112 bpm.  Her Cardizem was increased to 60 mg every 6 hours.  Cardiology was consulted.  On interview, patient reports that she is feeling better today. Breathing has been improving. Sometimes feels a fluttering in her chest that goes away with medication. Has a productive cough.    Past Medical History:  Diagnosis Date   Anxiety    Arthritis    Hyperlipidemia    Hypertension    Osteopenia 07/04/2021   Prediabetes 07/09/2021   Psoriasis     Past Surgical History:  Procedure Laterality Date   ABDOMINAL HYSTERECTOMY     CHOLECYSTECTOMY     HERNIA REPAIR     KNEE SURGERY     TUBAL LIGATION       Home Medications:  Prior to Admission medications   Medication Sig Start Date End Date Taking? Authorizing Provider  albuterol (VENTOLIN HFA) 108 (90 Base) MCG/ACT inhaler Inhale 2 puffs into the lungs every 6 (six) hours as needed. 06/21/20  Yes Deliah Boston F, FNP  CALCIUM PO Take 1 tablet by mouth daily.   Yes [provider]  cetirizine (ZYRTEC) 10 MG tablet Take 10 mg by mouth daily.   Yes [provider]  Cholecalciferol (VITAMIN D3) 1000 units CAPS Take 2,000 Units by mouth.   Yes [provider]  DG Chest Port 1 View  Result Date: 02/04/2023 CLINICAL DATA:  Provided history: Pneumonia. EXAM: PORTABLE CHEST 1 VIEW COMPARISON:  Chest radiographs 02/03/2023 and earlier FINDINGS: Right IJ approach central venous catheter with tip at the level of the lower SVC. The cardiomediastinal silhouette is unchanged. Aortic atherosclerosis. Prominence of the interstitial lung markings. Superimposed ill-defined opacities within the right lung base, progressed from the prior chest radiograph of 02/03/2023. No evidence of pleural effusion or pneumothorax. Thoracic spondylosis. IMPRESSION: 1. Persistent prominence of the interstitial lung markings, which may reflect interstitial edema or atypical infection. 2. Superimposed ill-defined opacities within the right lung base, slightly progressed. This may reflect atelectasis and/or pneumonia. 3. Aortic Atherosclerosis (ICD10-I70.0). Electronically Signed   By: Jackey Loge D.O.   On: 02/04/2023 10:22   ECHOCARDIOGRAM COMPLETE  Result  Date: 02/04/2023    ECHOCARDIOGRAM REPORT   [name] Date of Exam: 02/04/2023 Medical Rec #:  [id number]      Height:       64.0 in Accession #:    [id number]     Weight:       171.7 lb Date of Birth:  09-Sep-1945      BSA:          1.834 m Patient Age:    76 years       BP:           107/67 mmHg Patient Gender: F              HR:           85 bpm. Exam Location:  Inpatient Procedure: 2D Echo, Color Doppler and Cardiac Doppler Indications:    I48.91* Unspeicified atrial fibrillation  History:        Patient has no prior history of Echocardiogram examinations.                 Abnormal ECG, Arrythmias:Atrial Fibrillation,                 Signs/Symptoms:Dyspnea and Shortness of Breath; Risk                 Factors:Current Smoker and Hypertension. Shock. Substance abuse.                 Hypoxia.  Sonographer:    Sheralyn Boatman RDCS Referring Phys: [id number] Cristopher Peru  Sonographer Comments: Technically difficult study due to poor echo windows. Image acquisition challenging due to uncooperative patient. Patient moving throughout exam, had to constantly re-direct. IMPRESSIONS  1. Left ventricular ejection fraction, by estimation, is 60 to 65%. The left ventricle has normal function. The left ventricle has no regional wall motion abnormalities. Left ventricular diastolic parameters are consistent with Grade I diastolic dysfunction (impaired relaxation).  2. Right ventricular systolic function is normal. The right ventricular size is mildly enlarged. There is normal pulmonary artery systolic pressure.  3. Left atrial size was mildly dilated.  4. Right atrial size was mildly dilated.  5. The mitral valve is degenerative. No evidence of mitral valve regurgitation. No evidence of mitral stenosis.  6. The aortic valve is calcified. Aortic valve regurgitation is not visualized. Mild aortic valve stenosis. Aortic valve mean gradient measures 14.0 mmHg. Aortic valve Vmax measures 2.48 m/s.  7. The inferior vena  cava is dilated in size with <50% respiratory variability, suggesting right atrial pressure of 15 mmHg. Comparison(s): No prior Echocardiogram. FINDINGS  Left Ventricle: Left ventricular ejection fraction, by estimation, is 60 to  Cardiology Consultation   Patient ID: Denise Underwood MRN: [id number]; DOB: 1945-12-25  Admit date: 02/02/2023 Date of Consult: 02/05/2023  PCP:  Sonny Masters, FNP   [facility] Providers Cardiologist:  None      Patient Profile:   Denise Underwood is a 77 y.o. female with a hx of HTN, HLD, prediabetes, arthritis, COPD, tobacco use who is being seen 02/05/2023 for the evaluation of atrial fibrillation at the request of Dr. Isidoro Donning.  History of Present Illness:   Denise Underwood is a 77 year old female with above medical history.  Per chart review, patient does not follow with cardiology.  Does not appear to have any past cardiac history.  Patient presented to the ED at [facility] on 9/22 with worsening altered mental status, shortness of breath, tachycardia.  At the scene, patient was found to be 75% on room air.  In the ED, patient was found to be in new atrial fibrillation with HR in the 160s.  She was started on oxygen and Cardizem drip. Patient became hypotensive requiring vasopressor support.  Had a central line placed in the ER.  Lab work showed WBC elevated to 34.5.  Troponin 84, 109, 117.  BNP elevated to 697.  Chest x-ray showed mild bilateral interstitial opacities that could be due to pulmonary edema versus atypical infection.  She was started on BiPAP, Rocephin, Zithromax.  Critical care was consulted for admission to [facility].  With a working diagnosis of shock, likely secondary to sepsis from community-acquired pneumonia.  Patient underwent echocardiogram on 9/24 that showed EF 60-65%, grade 1 diastolic dysfunction, normal RV function, normal pulmonary artery systolic pressure, mild biatrial dilation.  Chest x-ray on 01/31/2023 showed persistent prominence of the interstitial lung markings, may reflect interstitial edema or atypical infection.  Also showed superimposed ill-defined opacities in the right lung base that had slightly progressed.  Patient was in normal sinus rhythm and was  transition from Cardizem gtt. to oral Cardizem on 9/24.  She was transitioned to oral Eliquis 5 mg twice daily.  On the evening of 9/24, patient went into A-fib with RVR.  Heart rate up to the 140s, but improved to the 90s-112 bpm.  Her Cardizem was increased to 60 mg every 6 hours.  Cardiology was consulted.  On interview, patient reports that she is feeling better today. Breathing has been improving. Sometimes feels a fluttering in her chest that goes away with medication. Has a productive cough.    Past Medical History:  Diagnosis Date   Anxiety    Arthritis    Hyperlipidemia    Hypertension    Osteopenia 07/04/2021   Prediabetes 07/09/2021   Psoriasis     Past Surgical History:  Procedure Laterality Date   ABDOMINAL HYSTERECTOMY     CHOLECYSTECTOMY     HERNIA REPAIR     KNEE SURGERY     TUBAL LIGATION       Home Medications:  Prior to Admission medications   Medication Sig Start Date End Date Taking? Authorizing Provider  albuterol (VENTOLIN HFA) 108 (90 Base) MCG/ACT inhaler Inhale 2 puffs into the lungs every 6 (six) hours as needed. 06/21/20  Yes Deliah Boston F, FNP  CALCIUM PO Take 1 tablet by mouth daily.   Yes [provider]  cetirizine (ZYRTEC) 10 MG tablet Take 10 mg by mouth daily.   Yes [provider]  Cholecalciferol (VITAMIN D3) 1000 units CAPS Take 2,000 Units by mouth.   Yes [provider]  DG Chest Port 1 View  Result Date: 02/04/2023 CLINICAL DATA:  Provided history: Pneumonia. EXAM: PORTABLE CHEST 1 VIEW COMPARISON:  Chest radiographs 02/03/2023 and earlier FINDINGS: Right IJ approach central venous catheter with tip at the level of the lower SVC. The cardiomediastinal silhouette is unchanged. Aortic atherosclerosis. Prominence of the interstitial lung markings. Superimposed ill-defined opacities within the right lung base, progressed from the prior chest radiograph of 02/03/2023. No evidence of pleural effusion or pneumothorax. Thoracic spondylosis. IMPRESSION: 1. Persistent prominence of the interstitial lung markings, which may reflect interstitial edema or atypical infection. 2. Superimposed ill-defined opacities within the right lung base, slightly progressed. This may reflect atelectasis and/or pneumonia. 3. Aortic Atherosclerosis (ICD10-I70.0). Electronically Signed   By: Jackey Loge D.O.   On: 02/04/2023 10:22   ECHOCARDIOGRAM COMPLETE  Result  Date: 02/04/2023    ECHOCARDIOGRAM REPORT   [name] Date of Exam: 02/04/2023 Medical Rec #:  [id number]      Height:       64.0 in Accession #:    [id number]     Weight:       171.7 lb Date of Birth:  09-Sep-1945      BSA:          1.834 m Patient Age:    76 years       BP:           107/67 mmHg Patient Gender: F              HR:           85 bpm. Exam Location:  Inpatient Procedure: 2D Echo, Color Doppler and Cardiac Doppler Indications:    I48.91* Unspeicified atrial fibrillation  History:        Patient has no prior history of Echocardiogram examinations.                 Abnormal ECG, Arrythmias:Atrial Fibrillation,                 Signs/Symptoms:Dyspnea and Shortness of Breath; Risk                 Factors:Current Smoker and Hypertension. Shock. Substance abuse.                 Hypoxia.  Sonographer:    Sheralyn Boatman RDCS Referring Phys: [id number] Cristopher Peru  Sonographer Comments: Technically difficult study due to poor echo windows. Image acquisition challenging due to uncooperative patient. Patient moving throughout exam, had to constantly re-direct. IMPRESSIONS  1. Left ventricular ejection fraction, by estimation, is 60 to 65%. The left ventricle has normal function. The left ventricle has no regional wall motion abnormalities. Left ventricular diastolic parameters are consistent with Grade I diastolic dysfunction (impaired relaxation).  2. Right ventricular systolic function is normal. The right ventricular size is mildly enlarged. There is normal pulmonary artery systolic pressure.  3. Left atrial size was mildly dilated.  4. Right atrial size was mildly dilated.  5. The mitral valve is degenerative. No evidence of mitral valve regurgitation. No evidence of mitral stenosis.  6. The aortic valve is calcified. Aortic valve regurgitation is not visualized. Mild aortic valve stenosis. Aortic valve mean gradient measures 14.0 mmHg. Aortic valve Vmax measures 2.48 m/s.  7. The inferior vena  cava is dilated in size with <50% respiratory variability, suggesting right atrial pressure of 15 mmHg. Comparison(s): No prior Echocardiogram. FINDINGS  Left Ventricle: Left ventricular ejection fraction, by estimation, is 60 to  DG Chest Port 1 View  Result Date: 02/04/2023 CLINICAL DATA:  Provided history: Pneumonia. EXAM: PORTABLE CHEST 1 VIEW COMPARISON:  Chest radiographs 02/03/2023 and earlier FINDINGS: Right IJ approach central venous catheter with tip at the level of the lower SVC. The cardiomediastinal silhouette is unchanged. Aortic atherosclerosis. Prominence of the interstitial lung markings. Superimposed ill-defined opacities within the right lung base, progressed from the prior chest radiograph of 02/03/2023. No evidence of pleural effusion or pneumothorax. Thoracic spondylosis. IMPRESSION: 1. Persistent prominence of the interstitial lung markings, which may reflect interstitial edema or atypical infection. 2. Superimposed ill-defined opacities within the right lung base, slightly progressed. This may reflect atelectasis and/or pneumonia. 3. Aortic Atherosclerosis (ICD10-I70.0). Electronically Signed   By: Jackey Loge D.O.   On: 02/04/2023 10:22   ECHOCARDIOGRAM COMPLETE  Result  Date: 02/04/2023    ECHOCARDIOGRAM REPORT   [name] Date of Exam: 02/04/2023 Medical Rec #:  [id number]      Height:       64.0 in Accession #:    [id number]     Weight:       171.7 lb Date of Birth:  09-Sep-1945      BSA:          1.834 m Patient Age:    76 years       BP:           107/67 mmHg Patient Gender: F              HR:           85 bpm. Exam Location:  Inpatient Procedure: 2D Echo, Color Doppler and Cardiac Doppler Indications:    I48.91* Unspeicified atrial fibrillation  History:        Patient has no prior history of Echocardiogram examinations.                 Abnormal ECG, Arrythmias:Atrial Fibrillation,                 Signs/Symptoms:Dyspnea and Shortness of Breath; Risk                 Factors:Current Smoker and Hypertension. Shock. Substance abuse.                 Hypoxia.  Sonographer:    Sheralyn Boatman RDCS Referring Phys: [id number] Cristopher Peru  Sonographer Comments: Technically difficult study due to poor echo windows. Image acquisition challenging due to uncooperative patient. Patient moving throughout exam, had to constantly re-direct. IMPRESSIONS  1. Left ventricular ejection fraction, by estimation, is 60 to 65%. The left ventricle has normal function. The left ventricle has no regional wall motion abnormalities. Left ventricular diastolic parameters are consistent with Grade I diastolic dysfunction (impaired relaxation).  2. Right ventricular systolic function is normal. The right ventricular size is mildly enlarged. There is normal pulmonary artery systolic pressure.  3. Left atrial size was mildly dilated.  4. Right atrial size was mildly dilated.  5. The mitral valve is degenerative. No evidence of mitral valve regurgitation. No evidence of mitral stenosis.  6. The aortic valve is calcified. Aortic valve regurgitation is not visualized. Mild aortic valve stenosis. Aortic valve mean gradient measures 14.0 mmHg. Aortic valve Vmax measures 2.48 m/s.  7. The inferior vena  cava is dilated in size with <50% respiratory variability, suggesting right atrial pressure of 15 mmHg. Comparison(s): No prior Echocardiogram. FINDINGS  Left Ventricle: Left ventricular ejection fraction, by estimation, is 60 to  Cardiology Consultation   Patient ID: Denise Underwood MRN: [id number]; DOB: 1945-12-25  Admit date: 02/02/2023 Date of Consult: 02/05/2023  PCP:  Sonny Masters, FNP   [facility] Providers Cardiologist:  None      Patient Profile:   Denise Underwood is a 77 y.o. female with a hx of HTN, HLD, prediabetes, arthritis, COPD, tobacco use who is being seen 02/05/2023 for the evaluation of atrial fibrillation at the request of Dr. Isidoro Donning.  History of Present Illness:   Denise Underwood is a 77 year old female with above medical history.  Per chart review, patient does not follow with cardiology.  Does not appear to have any past cardiac history.  Patient presented to the ED at [facility] on 9/22 with worsening altered mental status, shortness of breath, tachycardia.  At the scene, patient was found to be 75% on room air.  In the ED, patient was found to be in new atrial fibrillation with HR in the 160s.  She was started on oxygen and Cardizem drip. Patient became hypotensive requiring vasopressor support.  Had a central line placed in the ER.  Lab work showed WBC elevated to 34.5.  Troponin 84, 109, 117.  BNP elevated to 697.  Chest x-ray showed mild bilateral interstitial opacities that could be due to pulmonary edema versus atypical infection.  She was started on BiPAP, Rocephin, Zithromax.  Critical care was consulted for admission to [facility].  With a working diagnosis of shock, likely secondary to sepsis from community-acquired pneumonia.  Patient underwent echocardiogram on 9/24 that showed EF 60-65%, grade 1 diastolic dysfunction, normal RV function, normal pulmonary artery systolic pressure, mild biatrial dilation.  Chest x-ray on 01/31/2023 showed persistent prominence of the interstitial lung markings, may reflect interstitial edema or atypical infection.  Also showed superimposed ill-defined opacities in the right lung base that had slightly progressed.  Patient was in normal sinus rhythm and was  transition from Cardizem gtt. to oral Cardizem on 9/24.  She was transitioned to oral Eliquis 5 mg twice daily.  On the evening of 9/24, patient went into A-fib with RVR.  Heart rate up to the 140s, but improved to the 90s-112 bpm.  Her Cardizem was increased to 60 mg every 6 hours.  Cardiology was consulted.  On interview, patient reports that she is feeling better today. Breathing has been improving. Sometimes feels a fluttering in her chest that goes away with medication. Has a productive cough.    Past Medical History:  Diagnosis Date   Anxiety    Arthritis    Hyperlipidemia    Hypertension    Osteopenia 07/04/2021   Prediabetes 07/09/2021   Psoriasis     Past Surgical History:  Procedure Laterality Date   ABDOMINAL HYSTERECTOMY     CHOLECYSTECTOMY     HERNIA REPAIR     KNEE SURGERY     TUBAL LIGATION       Home Medications:  Prior to Admission medications   Medication Sig Start Date End Date Taking? Authorizing Provider  albuterol (VENTOLIN HFA) 108 (90 Base) MCG/ACT inhaler Inhale 2 puffs into the lungs every 6 (six) hours as needed. 06/21/20  Yes Deliah Boston F, FNP  CALCIUM PO Take 1 tablet by mouth daily.   Yes [provider]  cetirizine (ZYRTEC) 10 MG tablet Take 10 mg by mouth daily.   Yes [provider]  Cholecalciferol (VITAMIN D3) 1000 units CAPS Take 2,000 Units by mouth.   Yes [provider]  Cardiology Consultation   Patient ID: Denise Underwood MRN: [id number]; DOB: 1945-12-25  Admit date: 02/02/2023 Date of Consult: 02/05/2023  PCP:  Sonny Masters, FNP   [facility] Providers Cardiologist:  None      Patient Profile:   Denise Underwood is a 77 y.o. female with a hx of HTN, HLD, prediabetes, arthritis, COPD, tobacco use who is being seen 02/05/2023 for the evaluation of atrial fibrillation at the request of Dr. Isidoro Donning.  History of Present Illness:   Denise Underwood is a 77 year old female with above medical history.  Per chart review, patient does not follow with cardiology.  Does not appear to have any past cardiac history.  Patient presented to the ED at [facility] on 9/22 with worsening altered mental status, shortness of breath, tachycardia.  At the scene, patient was found to be 75% on room air.  In the ED, patient was found to be in new atrial fibrillation with HR in the 160s.  She was started on oxygen and Cardizem drip. Patient became hypotensive requiring vasopressor support.  Had a central line placed in the ER.  Lab work showed WBC elevated to 34.5.  Troponin 84, 109, 117.  BNP elevated to 697.  Chest x-ray showed mild bilateral interstitial opacities that could be due to pulmonary edema versus atypical infection.  She was started on BiPAP, Rocephin, Zithromax.  Critical care was consulted for admission to [facility].  With a working diagnosis of shock, likely secondary to sepsis from community-acquired pneumonia.  Patient underwent echocardiogram on 9/24 that showed EF 60-65%, grade 1 diastolic dysfunction, normal RV function, normal pulmonary artery systolic pressure, mild biatrial dilation.  Chest x-ray on 01/31/2023 showed persistent prominence of the interstitial lung markings, may reflect interstitial edema or atypical infection.  Also showed superimposed ill-defined opacities in the right lung base that had slightly progressed.  Patient was in normal sinus rhythm and was  transition from Cardizem gtt. to oral Cardizem on 9/24.  She was transitioned to oral Eliquis 5 mg twice daily.  On the evening of 9/24, patient went into A-fib with RVR.  Heart rate up to the 140s, but improved to the 90s-112 bpm.  Her Cardizem was increased to 60 mg every 6 hours.  Cardiology was consulted.  On interview, patient reports that she is feeling better today. Breathing has been improving. Sometimes feels a fluttering in her chest that goes away with medication. Has a productive cough.    Past Medical History:  Diagnosis Date   Anxiety    Arthritis    Hyperlipidemia    Hypertension    Osteopenia 07/04/2021   Prediabetes 07/09/2021   Psoriasis     Past Surgical History:  Procedure Laterality Date   ABDOMINAL HYSTERECTOMY     CHOLECYSTECTOMY     HERNIA REPAIR     KNEE SURGERY     TUBAL LIGATION       Home Medications:  Prior to Admission medications   Medication Sig Start Date End Date Taking? Authorizing Provider  albuterol (VENTOLIN HFA) 108 (90 Base) MCG/ACT inhaler Inhale 2 puffs into the lungs every 6 (six) hours as needed. 06/21/20  Yes Deliah Boston F, FNP  CALCIUM PO Take 1 tablet by mouth daily.   Yes [provider]  cetirizine (ZYRTEC) 10 MG tablet Take 10 mg by mouth daily.   Yes [provider]  Cholecalciferol (VITAMIN D3) 1000 units CAPS Take 2,000 Units by mouth.   Yes [provider]  DG Chest Port 1 View  Result Date: 02/04/2023 CLINICAL DATA:  Provided history: Pneumonia. EXAM: PORTABLE CHEST 1 VIEW COMPARISON:  Chest radiographs 02/03/2023 and earlier FINDINGS: Right IJ approach central venous catheter with tip at the level of the lower SVC. The cardiomediastinal silhouette is unchanged. Aortic atherosclerosis. Prominence of the interstitial lung markings. Superimposed ill-defined opacities within the right lung base, progressed from the prior chest radiograph of 02/03/2023. No evidence of pleural effusion or pneumothorax. Thoracic spondylosis. IMPRESSION: 1. Persistent prominence of the interstitial lung markings, which may reflect interstitial edema or atypical infection. 2. Superimposed ill-defined opacities within the right lung base, slightly progressed. This may reflect atelectasis and/or pneumonia. 3. Aortic Atherosclerosis (ICD10-I70.0). Electronically Signed   By: Jackey Loge D.O.   On: 02/04/2023 10:22   ECHOCARDIOGRAM COMPLETE  Result  Date: 02/04/2023    ECHOCARDIOGRAM REPORT   [name] Date of Exam: 02/04/2023 Medical Rec #:  [id number]      Height:       64.0 in Accession #:    [id number]     Weight:       171.7 lb Date of Birth:  09-Sep-1945      BSA:          1.834 m Patient Age:    76 years       BP:           107/67 mmHg Patient Gender: F              HR:           85 bpm. Exam Location:  Inpatient Procedure: 2D Echo, Color Doppler and Cardiac Doppler Indications:    I48.91* Unspeicified atrial fibrillation  History:        Patient has no prior history of Echocardiogram examinations.                 Abnormal ECG, Arrythmias:Atrial Fibrillation,                 Signs/Symptoms:Dyspnea and Shortness of Breath; Risk                 Factors:Current Smoker and Hypertension. Shock. Substance abuse.                 Hypoxia.  Sonographer:    Sheralyn Boatman RDCS Referring Phys: [id number] Cristopher Peru  Sonographer Comments: Technically difficult study due to poor echo windows. Image acquisition challenging due to uncooperative patient. Patient moving throughout exam, had to constantly re-direct. IMPRESSIONS  1. Left ventricular ejection fraction, by estimation, is 60 to 65%. The left ventricle has normal function. The left ventricle has no regional wall motion abnormalities. Left ventricular diastolic parameters are consistent with Grade I diastolic dysfunction (impaired relaxation).  2. Right ventricular systolic function is normal. The right ventricular size is mildly enlarged. There is normal pulmonary artery systolic pressure.  3. Left atrial size was mildly dilated.  4. Right atrial size was mildly dilated.  5. The mitral valve is degenerative. No evidence of mitral valve regurgitation. No evidence of mitral stenosis.  6. The aortic valve is calcified. Aortic valve regurgitation is not visualized. Mild aortic valve stenosis. Aortic valve mean gradient measures 14.0 mmHg. Aortic valve Vmax measures 2.48 m/s.  7. The inferior vena  cava is dilated in size with <50% respiratory variability, suggesting right atrial pressure of 15 mmHg. Comparison(s): No prior Echocardiogram. FINDINGS  Left Ventricle: Left ventricular ejection fraction, by estimation, is 60 to  DG Chest Port 1 View  Result Date: 02/04/2023 CLINICAL DATA:  Provided history: Pneumonia. EXAM: PORTABLE CHEST 1 VIEW COMPARISON:  Chest radiographs 02/03/2023 and earlier FINDINGS: Right IJ approach central venous catheter with tip at the level of the lower SVC. The cardiomediastinal silhouette is unchanged. Aortic atherosclerosis. Prominence of the interstitial lung markings. Superimposed ill-defined opacities within the right lung base, progressed from the prior chest radiograph of 02/03/2023. No evidence of pleural effusion or pneumothorax. Thoracic spondylosis. IMPRESSION: 1. Persistent prominence of the interstitial lung markings, which may reflect interstitial edema or atypical infection. 2. Superimposed ill-defined opacities within the right lung base, slightly progressed. This may reflect atelectasis and/or pneumonia. 3. Aortic Atherosclerosis (ICD10-I70.0). Electronically Signed   By: Jackey Loge D.O.   On: 02/04/2023 10:22   ECHOCARDIOGRAM COMPLETE  Result  Date: 02/04/2023    ECHOCARDIOGRAM REPORT   [name] Date of Exam: 02/04/2023 Medical Rec #:  [id number]      Height:       64.0 in Accession #:    [id number]     Weight:       171.7 lb Date of Birth:  09-Sep-1945      BSA:          1.834 m Patient Age:    76 years       BP:           107/67 mmHg Patient Gender: F              HR:           85 bpm. Exam Location:  Inpatient Procedure: 2D Echo, Color Doppler and Cardiac Doppler Indications:    I48.91* Unspeicified atrial fibrillation  History:        Patient has no prior history of Echocardiogram examinations.                 Abnormal ECG, Arrythmias:Atrial Fibrillation,                 Signs/Symptoms:Dyspnea and Shortness of Breath; Risk                 Factors:Current Smoker and Hypertension. Shock. Substance abuse.                 Hypoxia.  Sonographer:    Sheralyn Boatman RDCS Referring Phys: [id number] Cristopher Peru  Sonographer Comments: Technically difficult study due to poor echo windows. Image acquisition challenging due to uncooperative patient. Patient moving throughout exam, had to constantly re-direct. IMPRESSIONS  1. Left ventricular ejection fraction, by estimation, is 60 to 65%. The left ventricle has normal function. The left ventricle has no regional wall motion abnormalities. Left ventricular diastolic parameters are consistent with Grade I diastolic dysfunction (impaired relaxation).  2. Right ventricular systolic function is normal. The right ventricular size is mildly enlarged. There is normal pulmonary artery systolic pressure.  3. Left atrial size was mildly dilated.  4. Right atrial size was mildly dilated.  5. The mitral valve is degenerative. No evidence of mitral valve regurgitation. No evidence of mitral stenosis.  6. The aortic valve is calcified. Aortic valve regurgitation is not visualized. Mild aortic valve stenosis. Aortic valve mean gradient measures 14.0 mmHg. Aortic valve Vmax measures 2.48 m/s.  7. The inferior vena  cava is dilated in size with <50% respiratory variability, suggesting right atrial pressure of 15 mmHg. Comparison(s): No prior Echocardiogram. FINDINGS  Left Ventricle: Left ventricular ejection fraction, by estimation, is 60 to

## 2023-02-05 NOTE — Discharge Instructions (Addendum)

## 2023-02-06 DIAGNOSIS — I5033 Acute on chronic diastolic (congestive) heart failure: Secondary | ICD-10-CM | POA: Diagnosis not present

## 2023-02-06 DIAGNOSIS — J9602 Acute respiratory failure with hypercapnia: Secondary | ICD-10-CM | POA: Diagnosis not present

## 2023-02-06 DIAGNOSIS — A419 Sepsis, unspecified organism: Secondary | ICD-10-CM | POA: Diagnosis not present

## 2023-02-06 DIAGNOSIS — N179 Acute kidney failure, unspecified: Secondary | ICD-10-CM | POA: Diagnosis not present

## 2023-02-06 DIAGNOSIS — J441 Chronic obstructive pulmonary disease with (acute) exacerbation: Secondary | ICD-10-CM | POA: Diagnosis not present

## 2023-02-06 DIAGNOSIS — J449 Chronic obstructive pulmonary disease, unspecified: Secondary | ICD-10-CM | POA: Insufficient documentation

## 2023-02-06 DIAGNOSIS — G9341 Metabolic encephalopathy: Secondary | ICD-10-CM

## 2023-02-06 DIAGNOSIS — J9601 Acute respiratory failure with hypoxia: Secondary | ICD-10-CM | POA: Diagnosis not present

## 2023-02-06 DIAGNOSIS — I4891 Unspecified atrial fibrillation: Secondary | ICD-10-CM | POA: Diagnosis not present

## 2023-02-06 DIAGNOSIS — J189 Pneumonia, unspecified organism: Secondary | ICD-10-CM

## 2023-02-06 LAB — BLOOD GAS, ARTERIAL
Acid-Base Excess: 13.9 mmol/L — ABNORMAL HIGH (ref 0.0–2.0)
Bicarbonate: 43.1 mmol/L — ABNORMAL HIGH (ref 20.0–28.0)
Drawn by: 29503
O2 Content: 6 L/min
O2 Saturation: 98.9 %
Patient temperature: 37
pCO2 arterial: 78 mmHg (ref 32–48)
pH, Arterial: 7.35 (ref 7.35–7.45)
pO2, Arterial: 97 mmHg (ref 83–108)

## 2023-02-06 LAB — COMPREHENSIVE METABOLIC PANEL
ALT: 198 U/L — ABNORMAL HIGH (ref 0–44)
AST: 108 U/L — ABNORMAL HIGH (ref 15–41)
Albumin: 2.5 g/dL — ABNORMAL LOW (ref 3.5–5.0)
Alkaline Phosphatase: 122 U/L (ref 38–126)
Anion gap: 10 (ref 5–15)
BUN: 53 mg/dL — ABNORMAL HIGH (ref 8–23)
CO2: 32 mmol/L (ref 22–32)
Calcium: 9.6 mg/dL (ref 8.9–10.3)
Chloride: 97 mmol/L — ABNORMAL LOW (ref 98–111)
Creatinine, Ser: 1.54 mg/dL — ABNORMAL HIGH (ref 0.44–1.00)
GFR, Estimated: 35 mL/min — ABNORMAL LOW (ref >60)
Glucose, Bld: 122 mg/dL — ABNORMAL HIGH (ref 70–99)
Potassium: 3.7 mmol/L (ref 3.5–5.1)
Sodium: 139 mmol/L (ref 135–145)
Total Bilirubin: 0.9 mg/dL (ref 0.3–1.2)
Total Protein: 6.5 g/dL (ref 6.5–8.1)

## 2023-02-06 LAB — CULTURE, BLOOD (ROUTINE X 2)
Culture: NO GROWTH
Culture: NO GROWTH
Special Requests: ADEQUATE
Special Requests: ADEQUATE

## 2023-02-06 LAB — FOLATE: Folate: 21.3 ng/mL (ref >5.9)

## 2023-02-06 LAB — GLUCOSE, CAPILLARY
Glucose-Capillary: 122 mg/dL — ABNORMAL HIGH (ref 70–99)
Glucose-Capillary: 129 mg/dL — ABNORMAL HIGH (ref 70–99)
Glucose-Capillary: 212 mg/dL — ABNORMAL HIGH (ref 70–99)

## 2023-02-06 LAB — AMMONIA: Ammonia: 25 umol/L (ref 9–35)

## 2023-02-06 LAB — TSH: TSH: 0.256 u[IU]/mL — ABNORMAL LOW (ref 0.350–4.500)

## 2023-02-06 LAB — VITAMIN B12: Vitamin B-12: 1232 pg/mL — ABNORMAL HIGH (ref 180–914)

## 2023-02-06 LAB — LEGIONELLA PNEUMOPHILA SEROGP 1 UR AG: L. pneumophila Serogp 1 Ur Ag: NEGATIVE

## 2023-02-06 MED ORDER — POTASSIUM CHLORIDE CRYS ER 20 MEQ PO TBCR
20.0000 meq | EXTENDED_RELEASE_TABLET | Freq: Once | ORAL | Status: AC
  Administered 2023-02-06: 20 meq via ORAL
  Filled 2023-02-06: qty 1

## 2023-02-06 MED ORDER — FUROSEMIDE 10 MG/ML IJ SOLN
40.0000 mg | Freq: Once | INTRAMUSCULAR | Status: AC
  Administered 2023-02-06: 40 mg via INTRAVENOUS
  Filled 2023-02-06: qty 4

## 2023-02-06 MED ORDER — METOPROLOL TARTRATE 5 MG/5ML IV SOLN
2.5000 mg | Freq: Four times a day (QID) | INTRAVENOUS | Status: AC
  Administered 2023-02-06 (×4): 2.5 mg via INTRAVENOUS
  Filled 2023-02-06 (×4): qty 5

## 2023-02-06 NOTE — Progress Notes (Addendum)
[facility]                                                                              Denise Underwood, is a 77 y.o. female, DOB - 09-Sep-1945, YQM:[id number] Admit date - 02/02/2023    Outpatient Primary MD for the patient is [name], [name], FNP  LOS - 4  days  Chief Complaint  Patient presents with   Shortness of Breath   Altered Mental Status       Brief summary   Patient is a 77 year old female with HTN, HLP, prediabetes, arthritis, COPD, tobacco use presented to [name] ED on 02/02/2023 with shortness of breath.  Patient had noted 1 to 2 days of worsening shortness of breath, was found hypoxic with O2 sat 76% by EMS, dyspneic, tachypneic and unable to even stand at home. In ED, was found to have new atrial fibrillation with RVR, was placed on O2, Cardizem drip was started, patient converted to NSR. Subsequently noted to be hypotensive, received IV fluids and eventually required vasopressor support.  Patient was transferred and by PCCM to ICU on 02/03/2023  CTAP showed right lower lung infiltrates, small right pleural effusion.  ABG showed pH 7.15, pCO2 96, pO2 161, was placed on BiPAP and IV antibiotics. WBCs 34.5, creatinine 2.21, AST 760, ALT 445, troponin 84> 117.  BNP 697.  Significant Hospital Events:   9/23: admitted to [facility] 9/24: no acute events overnight. She was anxious per overnight notes and was eventually started on precedex gtt to help with BiPAP.   PCCM requested [facility] to assume care on 02/05/2023.  Assessment & Plan    Principal Problem:   Septic shock (HCC), community-acquired pneumonia -CT abdomen pelvis showed right lower lung infiltrates, UA negative for UTI, no intra-abdominal pathology. -Chest x-ray 02/04/2023 with worsening RLL infiltrates -Off vasopressors, blood cultures 9/22 negative so far -Flu, RSV, COVID-negative, respiratory virus panel negative, urine strept antigen negative, urine Legionella antigen pending -Continue IV  ceftriaxone, IV Zithromax -Sepsis physiology improving   Active Problems:   Acute respiratory failure with hypoxia and hypercapnia (HCC) Community-acquired pneumonia, underlying COPD, tobacco use, RLL pleural effusion -Not on O2 at baseline -Currently on BiPAP overnight and O2 6 L during the day -Continue Pulmicort, Brovana,Yupelri, pulmonary hygiene, flutter valve - add xopenex nebs, discontinued albuterol due to tachycardia -Solu-Cortef 50 mg every 12 hours -Will likely need BiPAP, nocturnal for home    New onset atrial fibrillation with RVR (HCC), elevated troponins Volume overload with acute diastolic CHF  -Patient was placed on Cardizem drip, was discontinued on 9/24 however overnight, was noted to be in A-fib with RVR, per CCM, Cardizem was increased to 60 mg every 6 hours -Started on apixaban -Elevated troponins likely due to demand ischemia from respiratory failure, septic shock -2D echo showed EF 60 to 65%, G1 DD, normal right ventricular systolic function -Received Lasix 40 mg IV x 1 on 9/25, cardiology following  Acute metabolic encephalopathy -Overnight received IM Zyprexa for agitation, now lethargic and somnolent -Likely due to hypercarbia ABG showed pH 7.3, pCO2 78, pO2 97 -Ammonia level normal 25, B12 normal, folate normal, TSH 0.2 -Placed  on BiPAP -Avoid sedating meds  Elevated acute transaminitis, shock liver -Likely due to septic shock, AST 768, ALT 445 on admission -Improving, sepsis physiology improved     AKI (acute kidney injury) (HCC) -Creatinine 2.21 on admission, was 1.1 on 12/25/2022 -Creatinine improving, 1.7  Pressure injury documentation -Lower coccyx medial stage I, POA -Wound care per nursing  Hyperlipidemia -Continue to hold statins due to acute transaminitis  Hypertension -Continue Cardizem, BP stable  Obesity Estimated body mass index is 30.24 kg/m as calculated from the following:   Height as of this encounter: 5\' 4"  (1.626 m).    Weight as of this encounter: 79.9 kg.  Code Status: Full CODE STATUS DVT Prophylaxis:   apixaban (ELIQUIS) tablet 5 mg   Level of Care: Level of care: ICU Family Communication: Updated patient.  No family at the bedside Disposition Plan:      Remains inpatient appropriate:       Procedures:  BiPAP  Consultants:   Patient was admitted by [facility]  Antimicrobials:   Anti-infectives (From admission, onward)    Start     Dose/Rate Route Frequency Ordered Stop   02/03/23 1130  cefTRIAXone (ROCEPHIN) 2 g in sodium chloride 0.9 % 100 mL IVPB        2 g 200 mL/hr over 30 Minutes Intravenous Every 24 hours 02/03/23 1032     02/02/23 2045  azithromycin (ZITHROMAX) 500 mg in sodium chloride 0.9 % 250 mL IVPB        500 mg 250 mL/hr over 60 Minutes Intravenous Every 24 hours 02/02/23 2032     02/02/23 2030  cefTRIAXone (ROCEPHIN) 1 g in sodium chloride 0.9 % 100 mL IVPB        1 g 200 mL/hr over 30 Minutes Intravenous  Once 02/02/23 2016 02/02/23 2112          Medications  apixaban  5 mg Oral BID   arformoterol  15 mcg Nebulization BID   budesonide (PULMICORT) nebulizer solution  0.5 mg Nebulization BID   Chlorhexidine Gluconate Cloth  6 each Topical Daily   diltiazem  60 mg Oral Q6H   hydrocortisone sod succinate (SOLU-CORTEF) inj  50 mg Intravenous Q12H   metoprolol tartrate  2.5 mg Intravenous Q6H   metoprolol tartrate  12.5 mg Oral BID   nicotine  21 mg Transdermal Daily   nystatin   Topical BID   mouth rinse  15 mL Mouth Rinse 4 times per day   pantoprazole (PROTONIX) IV  40 mg Intravenous Q24H   revefenacin  175 mcg Nebulization Daily   traMADol  50 mg Oral Daily   traZODone  50 mg Oral QHS      Subjective:   [name] was seen and examined today.  Noted very confused, somnolent, opens eyes to name but no meaningful conversation or following commands.  Heart rate controlled.  Objective:   Vitals:   02/06/23 0900 02/06/23 1000 02/06/23 1100  02/06/23 1200  BP: 121/69 (!) 145/75 (!) 150/72 (!) 153/57  Pulse:  88 74 83  Resp: (!) 29 (!) 23 (!) 29 (!) 27  Temp:    (!) 97.4 F (36.3 C)  TempSrc:    Axillary  SpO2:  99% 100% 100%  Weight:      Height:        Intake/Output Summary (Last 24 hours) at 02/06/2023 1309 Last data filed at 02/06/2023 1200 Gross per 24 hour  Intake 475.46 ml  Output 2150 ml  Net -1674.54 ml  Wt Readings from Last 3 Encounters:  02/06/23 79.9 kg  01/10/23 72.2 kg  12/25/22 72.7 kg   Physical Exam General: Somnolent, opens eyes to name  Cardiovascular: S1 S2 clear, RRR.  Respiratory: Diminished breath sound at the bases with rhonchi Gastrointestinal: Soft, nontender, nondistended, NBS Ext: no pedal edema bilaterally Neuro: no new deficits Psych: somnolent       Data Reviewed:  I have personally reviewed following labs    CBC Lab Results  Component Value Date   WBC 37.4 (H) 02/04/2023   RBC 3.22 (L) 02/04/2023   HGB 11.1 (L) 02/04/2023   HCT 35.7 (L) 02/04/2023   MCV 110.9 (H) 02/04/2023   MCH 34.5 (H) 02/04/2023   PLT 230 02/04/2023   MCHC 31.1 02/04/2023   RDW 13.4 02/04/2023   LYMPHSABS 0.4 (L) 02/03/2023   MONOABS 1.2 (H) 02/03/2023   EOSABS 0.0 02/03/2023   BASOSABS 0.0 02/03/2023     Last metabolic panel Lab Results  Component Value Date   NA 139 02/06/2023   K 3.7 02/06/2023   CL 97 (L) 02/06/2023   CO2 32 02/06/2023   BUN 53 (H) 02/06/2023   CREATININE 1.54 (H) 02/06/2023   GLUCOSE 122 (H) 02/06/2023   GFRNONAA 35 (L) 02/06/2023   GFRAA 74 06/21/2020   CALCIUM 9.6 02/06/2023   PROT 6.5 02/06/2023   ALBUMIN 2.5 (L) 02/06/2023   LABGLOB 2.3 12/25/2022   AGRATIO 1.5 05/30/2022   BILITOT 0.9 02/06/2023   ALKPHOS 122 02/06/2023   AST 108 (H) 02/06/2023   ALT 198 (H) 02/06/2023   ANIONGAP 10 02/06/2023    CBG (last 3)  Recent Labs    02/05/23 1136 02/05/23 2342 02/06/23 1159  GLUCAP 110* 125* 129*      Coagulation Profile: Recent Labs   Lab 02/02/23 1949  INR 1.2     Radiology Studies: I have personally reviewed the imaging studies  No results found.     Thad Ranger M.D. [facility] 02/06/2023, 1:09 PM  Available via Epic secure chat 7am-7pm After 7 pm, please refer to night coverage provider listed on amion.

## 2023-02-06 NOTE — Progress Notes (Signed)
   02/06/23 2343  BiPAP/CPAP/SIPAP  Reason BIPAP/CPAP not in use Non-compliant (PT refused)

## 2023-02-06 NOTE — Progress Notes (Signed)
Rounding Note    Patient Name: Denise Underwood Date of Encounter: 02/06/2023  [facility] Cardiologist: Little Ishikawa, MD   Subjective   BP 153/57.  Net -1.4 L yesterday, renal function improving (1.72 > 1.54).  She is currently somnolent but arousable, on BiPAP  Inpatient Medications    Scheduled Meds:  apixaban  5 mg Oral BID   arformoterol  15 mcg Nebulization BID   budesonide (PULMICORT) nebulizer solution  0.5 mg Nebulization BID   Chlorhexidine Gluconate Cloth  6 each Topical Daily   diltiazem  60 mg Oral Q6H   hydrocortisone sod succinate (SOLU-CORTEF) inj  50 mg Intravenous Q12H   metoprolol tartrate  2.5 mg Intravenous Q6H   metoprolol tartrate  12.5 mg Oral BID   nicotine  21 mg Transdermal Daily   nystatin   Topical BID   mouth rinse  15 mL Mouth Rinse 4 times per day   pantoprazole (PROTONIX) IV  40 mg Intravenous Q24H   revefenacin  175 mcg Nebulization Daily   traMADol  50 mg Oral Daily   traZODone  50 mg Oral QHS   Continuous Infusions:  sodium chloride Stopped (02/04/23 0648)   azithromycin Stopped (02/05/23 2245)   cefTRIAXone (ROCEPHIN)  IV Stopped (02/06/23 1144)   PRN Meds: docusate sodium, levalbuterol, mouth rinse, polyethylene glycol, polyvinyl alcohol, traMADol   Vital Signs    Vitals:   02/06/23 0900 02/06/23 1000 02/06/23 1100 02/06/23 1200  BP: 121/69 (!) 145/75 (!) 150/72 (!) 153/57  Pulse:  88 74 83  Resp: (!) 29 (!) 23 (!) 29 (!) 27  Temp:    (!) 97.4 F (36.3 C)  TempSrc:    Axillary  SpO2:  99% 100% 100%  Weight:      Height:        Intake/Output Summary (Last 24 hours) at 02/06/2023 1327 Last data filed at 02/06/2023 1200 Gross per 24 hour  Intake 475.46 ml  Output 2150 ml  Net -1674.54 ml      02/06/2023    2:45 AM 02/05/2023    7:03 AM 02/04/2023    7:00 AM  Last 3 Weights  Weight (lbs) 176 lb 2.4 oz 172 lb 2.9 oz 171 lb 11.8 oz  Weight (kg) 79.9 kg 78.1 kg 77.9 kg      Telemetry    A-fib with  rate 70s to 80s- Personally Reviewed  ECG    No new ECG - Personally Reviewed  Physical Exam   GEN: On BiPAP Neck: + JVD Cardiac: irregular, normal rate, no murmurs, rubs, or gallops.  Respiratory: Rhonchi  GI: Soft, nontender, non-distended  MS: Trace edema Neuro: Somnolent, but arousable Psych: Unable to assess  Labs    High Sensitivity Troponin:   Recent Labs  Lab 02/02/23 1949 02/02/23 2155 02/03/23 0210 02/03/23 1029 02/03/23 1251  TROPONINIHS 84* 109* 117* 106* 102*     Chemistry Recent Labs  Lab 02/02/23 1949 02/03/23 1029 02/03/23 1829 02/04/23 0459 02/05/23 0510 02/06/23 0509  NA 136  --    < > 141 135 139  K 4.9  --    < > 4.7 4.3 3.7  CL 93*  --    < > 101 97* 97*  CO2 28  --    < > 29 28 32  GLUCOSE 189*  --    < > 135* 126* 122*  BUN 46*  --    < > 50* 54* 53*  CREATININE 2.21*  --    < >  1.85* 1.72* 1.54*  CALCIUM 9.0  --    < > 8.8* 8.9 9.6  MG 2.1 1.9  --  2.1  --   --   PROT 6.7  --    < > 6.4* 6.2* 6.5  ALBUMIN 3.0*  --    < > 2.5* 2.5* 2.5*  AST 768*  --    < > 403* 215* 108*  ALT 445*  --    < > 335* 265* 198*  ALKPHOS 105  --    < > 122 136* 122  BILITOT 0.9  --    < > 0.8 0.7 0.9  GFRNONAA 22*  --    < > 28* 30* 35*  ANIONGAP 15  --    < > 11 10 10    < > = values in this interval not displayed.    Lipids No results for input(s): "CHOL", "TRIG", "HDL", "LABVLDL", "LDLCALC", "CHOLHDL" in the last 168 hours.  Hematology Recent Labs  Lab 02/02/23 1949 02/03/23 1829 02/04/23 0459  WBC 34.5* 39.3* 37.4*  RBC 3.50* 3.26* 3.22*  HGB 12.1 11.1* 11.1*  HCT 38.5 36.8 35.7*  MCV 110.0* 112.9* 110.9*  MCH 34.6* 34.0 34.5*  MCHC 31.4 30.2 31.1  RDW 13.5 13.5 13.4  PLT 232 212 230   Thyroid  Recent Labs  Lab 02/06/23 0813  TSH 0.256*    BNP Recent Labs  Lab 02/02/23 1947  BNP 697.0*    DDimer No results for input(s): "DDIMER" in the last 168 hours.   Radiology    No results found.  Cardiac Studies     Patient  Profile     77 y.o. female with a hx of HTN, HLD, prediabetes, arthritis, COPD, tobacco use who is being seen 02/05/2023 for the evaluation of atrial fibrillation   Assessment & Plan    Paroxysmal atrial fibrillation: Presented to the ED on 01/2021, found to be in new onset A-fib.  Admitted with pneumonia and septic shock.  Echocardiogram showed normal biventricular function, no significant valvular disease.  CHA2DS2-VASc 5 (CHF, hypertension, age x 2, female) -Continue Eliquis 5 mg twice daily -Continue p.o. diltiazem 60 mg every 6 hours.  Will plan to consolidate to long-acting diltiazem prior to discharge.  Can continue metoprolol 12.5 mg twice daily as well.  Rates currently appear well-controlled -Suspect A-fib is driven by pneumonia and will improve as recovers from underlying illness  Troponin elevation: Troponin 84 > 109 > 117 > 106 > 102.  She denies any chest pain.  Echo with normal systolic function.  Suspect demand ischemia in setting of sepsis and A-fib with RVR.  No plans for ischemic evaluation at this time  Acute on chronic diastolic heart failure: BNP 677 on 9/22.  Chest x-ray with interstitial edema versus atypical infection.  Net positive this admission due to IV fluids she received for sepsis.  She has CVC in place, transduced CVP yesterday  and was 11.  Gave IV Lasix 40 mg x 1.  Remains volume overloaded, will give another dose of IV Lasix today  For questions or updates, please contact [facility] Please consult [email or web address] for contact info under        Signed, Little Ishikawa, MD  02/06/2023, 1:27 PM

## 2023-02-06 NOTE — Progress Notes (Signed)
PT Cancellation Note  Patient Details Name: Denise Underwood MRN: [id number] DOB: [birth year]   Cancelled Treatment:    Reason Eval/Treat Not Completed: Medical issues which prohibited therapy;  per RN defer PT today; pt is confused, on Bipap. Will attempt once more, if medically stable eval if not PT to sign off.    [facility] 02/06/2023, 9:32 AM

## 2023-02-06 NOTE — Progress Notes (Signed)
NAME:  Denise Underwood, MRN:  [id number], DOB:  04-Nov-1945, LOS: 4 ADMISSION DATE:  02/02/2023, CONSULTATION DATE:  02/02/2023 REFERRING MD:  Eber Hong, MD EDP, CHIEF COMPLAINT:  acute respiratory failure    History of Present Illness:  77 year old female with past medical history of hypertension, hyperlipidemia, pre-diabetes, arthritis, leukocytosis, COPD, tobacco use who presented to [facility] emergency department on 02/02/23 for shortness of breath. Noted to have 1-2 days of worsening shortness of breath and found to have room air sat of 76% by EMS. Noted to be dyspneic and unable to even stand at home. In the ED, tachypneic, new atrial fibrillation with RVR,. She was placed on oxygen. Cardizem drip was started and the patient converted to normal sinus rhythm. Subsequently, she continued to be hypotensive and 2L IVF were ordered and eventually required vasopressor support. EDP placed central line. Additionally, her laboratory work up showed new WBC 34.5, Cl 93, BUN/Cr 46/ 2.21, AST 768, ALT 445, troponin 84>109>117, BNP 697, lactic wnl. UA without UTI. BCX sent. CXR showing edema vs atypical infection, CT AP showing RLL infilatrate, small right pleural effusion, no acute intraabdominal findings. ABG ordered showed pH 7.15, pCO2 96, pO2 161. Placed on BiPAP. Repeat 7.19/77/103. She was started on Rocephin, Zithromax.  PCCM was consulted for admission.   Pertinent  Medical History  hypertension, hyperlipidemia, pre-diabetes, arthritis, leukocytosis, COPD, tobacco use  Significant Hospital Events: Including procedures, antibiotic start and stop dates in addition to other pertinent events   9/23: admitted to [facility] 9/24: no acute events overnight. She was anxious per overnight notes and was eventually started on precedex gtt to help with BiPAP.  9/26 minimally responsive early AM. Reportedly had worn BiPAP overnight, but was also agitated and received olanzapine injection.   Interim History /  Subjective:   Was off BiPAP early this morning, but looks like she was on overnight.  Agitated last night requiring one time dose of IM Zyprexa.  We were called early for concern for poor responsiveness.  Objective   Blood pressure (!) 115/47, pulse 87, temperature (!) 97.4 F (36.3 C), temperature source Axillary, resp. rate (!) 23, height 5\' 4"  (1.626 m), weight 79.9 kg, SpO2 98%.    FiO2 (%):  [35 %] 35 % PEEP:  [5 cmH20] 5 cmH20 Pressure Support:  [12 cmH20] 12 cmH20   Intake/Output Summary (Last 24 hours) at 02/06/2023 0920 Last data filed at 02/06/2023 0600 Gross per 24 hour  Intake 675.46 ml  Output 2151 ml  Net -1475.54 ml   Filed Weights   02/04/23 0700 02/05/23 0703 02/06/23 0245  Weight: 77.9 kg 78.1 kg 79.9 kg    Examination: General: Elderly female on BiPAP HENT:  [place]/AT, PERRL, no JVD Lungs: bibasilar rhonchi, no distress on BiPAP Cardiovascular: RRR, no MRG Abdomen: Soft, NT, ND Extremities: no acute deformity or ROM limitation Neuro: Somnolent, but arouses to touch and answers questions appropriately.   Sig labs: Creat 1.5, BUN 53, AST/ALT improving   Resolved Hospital Problem list   Shock  Assessment & Plan:  Sespis from community acquired pneumonia. CT demonstrating RLL infiltrate. RVP negative. UA not consistent with UTI. CTAP without intraabdominal source. CXR 02/04/23 with worsening RLL infiltrate  RVP, MRSA PCR, urine legionella/strep all negative - con't rocephin, azithromycin x 5 ds - sputum culture if able to expectorate > NGTD - trend WBC, fever curve    Acute metabolic encephalopathy: worse this morning. Minimally responsive per reports. ABG showing worsening hypercarbia, but pH wnl. When  I examined her she had been back on BiPAP for a short time, but LOC had improved. May also be lingering olanzapine. Ammonia wnl.  - PRN BiPAP - Need to ensure BiPAP compliance at night.  - Careful with sedating medications.  - Seems to be improving with  correction of hypercarbia.   Acute hypoxic and hypercarbic respiratory failure 2/2 RLL PNA  Small RLL pleural effusion  Underlying COPD and tobacco use. - budesonide/brovana/yupelri  - Solu-Cortef to 50mg  q12h - at bedtime BiPAP and PRN during the day.  - wean FiO2 with O2 sat > 88-95% - pulmonary hygiene  - She may qualify for home NIV  Atrial Fibrillation, new; CHA2DS2VASc 5 Elevated troponin, BNP - likely demand. In NSR now  Echo with normal LV, enlarged RV - per primary/Cardiology - oral Cardizem  - On Eliquis, monitor for worsening hemoptysis  Elevated LFTs ; likely shock liver. Hepatitis panel negative. RUQ [place] without acute findings.  - per primary  Acute Kidney Injury secondary to septic shock. ATN. Improving - trend BMP, electrolytes  Hyperglycemia: likely steroid induced  - per primary  Tobacco use  - counsel on cessation when appropriate - nicotine patch if needed   She may qualify for home NIV, will provide pulmonary office follow-up at discharge  Best Practice (right click and "Reselect all SmartList Selections" daily)   Per TRH Code Status:  full code Last date of multidisciplinary goals of care discussion [discussed plan of care with patient at bedside on arrival]  Labs   CBC: Recent Labs  Lab 02/02/23 1949 02/03/23 1829 02/04/23 0459  WBC 34.5* 39.3* 37.4*  NEUTROABS 30.0* 37.7*  --   HGB 12.1 11.1* 11.1*  HCT 38.5 36.8 35.7*  MCV 110.0* 112.9* 110.9*  PLT 232 212 230    Basic Metabolic Panel: Recent Labs  Lab 02/02/23 1949 02/03/23 1029 02/03/23 1829 02/04/23 0459 02/05/23 0510 02/06/23 0509  NA 136  --  140 141 135 139  K 4.9  --  4.7 4.7 4.3 3.7  CL 93*  --  99 101 97* 97*  CO2 28  --  29 29 28  32  GLUCOSE 189*  --  171* 135* 126* 122*  BUN 46*  --  47* 50* 54* 53*  CREATININE 2.21*  --  2.18* 1.85* 1.72* 1.54*  CALCIUM 9.0  --  8.6* 8.8* 8.9 9.6  MG 2.1 1.9  --  2.1  --   --    GFR: Estimated Creatinine Clearance: 31.3  mL/min (A) (by C-G formula based on SCr of 1.54 mg/dL (H)). Recent Labs  Lab 02/02/23 1949 02/02/23 2159 02/03/23 1829 02/04/23 0459  WBC 34.5*  --  39.3* 37.4*  LATICACIDVEN 1.8 1.3  --   --     Liver Function Tests: Recent Labs  Lab 02/02/23 1949 02/03/23 1829 02/04/23 0459 02/05/23 0510 02/06/23 0509  AST 768* 601* 403* 215* 108*  ALT 445* 390* 335* 265* 198*  ALKPHOS 105 116 122 136* 122  BILITOT 0.9 0.6 0.8 0.7 0.9  PROT 6.7 6.5 6.4* 6.2* 6.5  ALBUMIN 3.0* 2.6* 2.5* 2.5* 2.5*   No results for input(s): "LIPASE", "AMYLASE" in the last 168 hours. Recent Labs  Lab 02/06/23 0813  AMMONIA 25    ABG    Component Value Date/Time   PHART 7.35 02/06/2023 0811   PCO2ART 78 (HH) 02/06/2023 0811   PO2ART 97 02/06/2023 0811   HCO3 43.1 (H) 02/06/2023 0811   ACIDBASEDEF 0.9 02/03/2023 0132   O2SAT  98.9 02/06/2023 0811     Coagulation Profile: Recent Labs  Lab 02/02/23 1949  INR 1.2    Cardiac Enzymes: No results for input(s): "CKTOTAL", "CKMB", "CKMBINDEX", "TROPONINI" in the last 168 hours.  HbA1C: HB A1C (BAYER DCA - WAIVED)  Date/Time Value Ref Range Status  01/03/2022 10:15 AM 5.3 4.8 - 5.6 % Final    Comment:             Prediabetes: 5.7 - 6.4          Diabetes: >6.4          Glycemic control for adults with diabetes: <7.0   10/02/2021 09:58 AM 5.3 4.8 - 5.6 % Final    Comment:             Prediabetes: 5.7 - 6.4          Diabetes: >6.4          Glycemic control for adults with diabetes: <7.0    Hgb A1c MFr Bld  Date/Time Value Ref Range Status  02/03/2023 10:29 AM 6.2 (H) 4.8 - 5.6 % Final    Comment:    (NOTE) Pre diabetes:          5.7%-6.4%  Diabetes:              >6.4%  Glycemic control for   <7.0% adults with diabetes     CBG: Recent Labs  Lab 02/04/23 1138 02/04/23 1753 02/04/23 2348 02/05/23 1136 02/05/23 2342  GLUCAP 136* 138* 149* 110* 125*    Joneen Roach, AGACNP-BC [facility]  See Amion for  personal pager PCCM on call pager [phone] until 7pm. Please call Elink 7p-7a. [phone]  02/06/2023 9:35 AM

## 2023-02-07 DIAGNOSIS — I5033 Acute on chronic diastolic (congestive) heart failure: Secondary | ICD-10-CM | POA: Diagnosis not present

## 2023-02-07 DIAGNOSIS — J441 Chronic obstructive pulmonary disease with (acute) exacerbation: Secondary | ICD-10-CM | POA: Diagnosis not present

## 2023-02-07 DIAGNOSIS — R6521 Severe sepsis with septic shock: Secondary | ICD-10-CM | POA: Diagnosis not present

## 2023-02-07 DIAGNOSIS — J9601 Acute respiratory failure with hypoxia: Secondary | ICD-10-CM | POA: Diagnosis not present

## 2023-02-07 DIAGNOSIS — I4891 Unspecified atrial fibrillation: Secondary | ICD-10-CM | POA: Diagnosis not present

## 2023-02-07 DIAGNOSIS — A419 Sepsis, unspecified organism: Secondary | ICD-10-CM | POA: Diagnosis not present

## 2023-02-07 DIAGNOSIS — Z7189 Other specified counseling: Secondary | ICD-10-CM

## 2023-02-07 DIAGNOSIS — J209 Acute bronchitis, unspecified: Secondary | ICD-10-CM

## 2023-02-07 DIAGNOSIS — N179 Acute kidney failure, unspecified: Secondary | ICD-10-CM | POA: Diagnosis not present

## 2023-02-07 DIAGNOSIS — J440 Chronic obstructive pulmonary disease with acute lower respiratory infection: Secondary | ICD-10-CM | POA: Diagnosis not present

## 2023-02-07 LAB — CBC
HCT: 38.4 % (ref 36.0–46.0)
Hemoglobin: 12.3 g/dL (ref 12.0–15.0)
MCH: 33.3 pg (ref 26.0–34.0)
MCHC: 32 g/dL (ref 30.0–36.0)
MCV: 104.1 fL — ABNORMAL HIGH (ref 80.0–100.0)
Platelets: 185 10*3/uL (ref 150–400)
RBC: 3.69 MIL/uL — ABNORMAL LOW (ref 3.87–5.11)
RDW: 13.2 % (ref 11.5–15.5)
WBC: 31.8 10*3/uL — ABNORMAL HIGH (ref 4.0–10.5)
nRBC: 0.1 % (ref 0.0–0.2)

## 2023-02-07 LAB — COMPREHENSIVE METABOLIC PANEL
ALT: 139 U/L — ABNORMAL HIGH (ref 0–44)
AST: 52 U/L — ABNORMAL HIGH (ref 15–41)
Albumin: 2.4 g/dL — ABNORMAL LOW (ref 3.5–5.0)
Alkaline Phosphatase: 105 U/L (ref 38–126)
Anion gap: 10 (ref 5–15)
BUN: 46 mg/dL — ABNORMAL HIGH (ref 8–23)
CO2: 40 mmol/L — ABNORMAL HIGH (ref 22–32)
Calcium: 9.4 mg/dL (ref 8.9–10.3)
Chloride: 91 mmol/L — ABNORMAL LOW (ref 98–111)
Creatinine, Ser: 1.2 mg/dL — ABNORMAL HIGH (ref 0.44–1.00)
GFR, Estimated: 47 mL/min — ABNORMAL LOW (ref >60)
Glucose, Bld: 153 mg/dL — ABNORMAL HIGH (ref 70–99)
Potassium: 3 mmol/L — ABNORMAL LOW (ref 3.5–5.1)
Sodium: 141 mmol/L (ref 135–145)
Total Bilirubin: 0.8 mg/dL (ref 0.3–1.2)
Total Protein: 6.2 g/dL — ABNORMAL LOW (ref 6.5–8.1)

## 2023-02-07 LAB — GLUCOSE, CAPILLARY
Glucose-Capillary: 147 mg/dL — ABNORMAL HIGH (ref 70–99)
Glucose-Capillary: 188 mg/dL — ABNORMAL HIGH (ref 70–99)
Glucose-Capillary: 179 mg/dL — ABNORMAL HIGH (ref 70–99)

## 2023-02-07 LAB — MAGNESIUM: Magnesium: 1.6 mg/dL — ABNORMAL LOW (ref 1.7–2.4)

## 2023-02-07 MED ORDER — POTASSIUM CHLORIDE CRYS ER 20 MEQ PO TBCR
40.0000 meq | EXTENDED_RELEASE_TABLET | Freq: Once | ORAL | Status: AC
  Administered 2023-02-07: 40 meq via ORAL
  Filled 2023-02-07: qty 2

## 2023-02-07 MED ORDER — HYDROCORTISONE SOD SUC (PF) 100 MG IJ SOLR
50.0000 mg | Freq: Every day | INTRAMUSCULAR | Status: DC
  Administered 2023-02-07 – 2023-02-10 (×4): 50 mg via INTRAVENOUS
  Filled 2023-02-07: qty 2
  Filled 2023-02-07 (×2): qty 1
  Filled 2023-02-07: qty 2

## 2023-02-07 MED ORDER — METOPROLOL TARTRATE 5 MG/5ML IV SOLN
INTRAVENOUS | Status: AC
  Filled 2023-02-07: qty 5

## 2023-02-07 MED ORDER — FUROSEMIDE 10 MG/ML IJ SOLN
40.0000 mg | Freq: Once | INTRAMUSCULAR | Status: AC
  Administered 2023-02-07: 40 mg via INTRAVENOUS
  Filled 2023-02-07: qty 4

## 2023-02-07 MED ORDER — LORAZEPAM 0.5 MG PO TABS
0.5000 mg | ORAL_TABLET | Freq: Three times a day (TID) | ORAL | Status: DC | PRN
  Administered 2023-02-07 – 2023-02-10 (×4): 0.5 mg via ORAL
  Filled 2023-02-07 (×4): qty 1

## 2023-02-07 MED ORDER — METOPROLOL TARTRATE 25 MG PO TABS
25.0000 mg | ORAL_TABLET | Freq: Two times a day (BID) | ORAL | Status: DC
  Administered 2023-02-07 – 2023-02-11 (×8): 25 mg via ORAL
  Filled 2023-02-07 (×8): qty 1

## 2023-02-07 MED ORDER — MAGNESIUM SULFATE 50 % IJ SOLN
4.0000 g | Freq: Once | INTRAVENOUS | Status: AC
  Administered 2023-02-07: 4 g via INTRAVENOUS
  Filled 2023-02-07: qty 8

## 2023-02-07 MED ORDER — METOPROLOL TARTRATE 5 MG/5ML IV SOLN
5.0000 mg | Freq: Once | INTRAVENOUS | Status: AC
  Administered 2023-02-07: 5 mg via INTRAVENOUS

## 2023-02-07 MED ORDER — PANTOPRAZOLE SODIUM 40 MG PO TBEC
40.0000 mg | DELAYED_RELEASE_TABLET | Freq: Every day | ORAL | Status: DC
  Administered 2023-02-08 – 2023-02-11 (×4): 40 mg via ORAL
  Filled 2023-02-07 (×4): qty 1

## 2023-02-07 NOTE — Consult Note (Addendum)
Consultation Note Date: 02/07/2023   Patient Name: Denise Underwood  DOB: 10-25-1945  MRN: [id number]  Age / Sex: 77 y.o., female  PCP: Sonny Masters, FNP Referring Physician: Cathren Harsh, MD  Reason for Consultation: goals of care  HPI/Patient Profile: 77 y.o. female  with past medical history of HTN, COPD, HLD, DM, COPD admitted on 02/02/2023 with sepsis related to pneumonia and COPD exacerbation.   Primary Decision Maker PATIENT and her daughters- Denise Underwood and Denise Underwood  Discussion: I have reviewed medical records including Care Everywhere, progress notes from this and prior admissions, labs and imaging, discussed with RN.  On evaluation patient awake and alert. IOriented to person and place, has low insight into her health issues. She tells me she is admitted because of pneumonia. However, tells me she does not have any chronic illnesses, and denies that she has COPD and does not engage in discussion about her health. Sat up in bed and was able to feed herself.   Lives at home by herself, mostly independent, has an aide who comes daily for a few hours. Her daughters live close by and check on her daily.   Attempted to discuss Advance Care planning and goals of care, but she did not engage.   I called her daughter Denise Underwood for further discussion. Denise Underwood endorses that patient at baseline refuses to discuss her health issues and always denies that she has any chronic health issues. Patient has had several siblings who have died from COPD and emphysema.   We discussed patient's current illness and what it means in the larger context of patient's on-going co-morbidities.  Natural disease trajectory and expectations at EOL were discussed.  Advance directives, concepts specific to code status, artificial feeding and hydration, and rehospitalization were considered and discussed.Denise Underwood notes that she and her mother  have never really discussed EOL wishes or her feelings about concepts such as artificial life support. Denise Underwood acknowledges the need to have these discussions, and recalls having them with her father when he was dying from cancer.   I reviewed the benefit of advance directives and advance care planning for everyone, not only in patient's with chronic terminal illnesses, however, it is especially important in patient's who have a life limiting illness such as COPD who have had a recent decline and expect to have more exacerbations and decline in the future. Denise agreed. Denise Underwood feels her mom would be more receptive to a discussion after discharge.   Discussed with patient/family the importance of continued conversation with family and the medical providers regarding overall plan of care and treatment options, ensuring decisions are within the context of the patient's values and GOCs.    Questions and concerns were addressed. The family was encouraged to call with questions or concerns.   SUMMARY OF RECOMMENDATIONS -Continue current plan of care- full scop, full code -Recommend continued goals of care and advanced care planning discussions after discharge per daughter's recommendation- I encouraged daughter to discuss with patient's PCP and consider outpatient Palliative if receptive -GOC  for this admission is to stabilize and discharge home -PMT will f/u on Monday if pt still admitted    Code Status/Advance Care Planning: Full code   Prognosis:   Unable to determine  Discharge Planning: Home with Home Health  Primary Diagnoses: Present on Admission:  Septic shock (HCC)  Sepsis (HCC)  New onset atrial fibrillation (HCC)  Pressure injury of skin  Atrial fibrillation with RVR (HCC)  Shock liver   Review of Systems  Constitutional:  Positive for activity change. Negative for appetite change.  Respiratory:  Positive for shortness of breath.     Physical Exam Vitals and nursing note  reviewed.  Constitutional:      General: She is not in acute distress. Pulmonary:     Effort: Pulmonary effort is normal.  Neurological:     Mental Status: She is alert and oriented to person, place, and time.     Vital Signs: BP (!) 167/54   Pulse 78   Temp 97.9 F (36.6 C) (Oral)   Resp (!) 25   Ht 5\' 4"  (1.626 m)   Wt 75.8 kg   SpO2 94%   BMI 28.68 kg/m  Pain Scale: 0-10   Pain Score: 0-No pain   SpO2: SpO2: 94 % O2 Device:SpO2: 94 % O2 Flow Rate: .O2 Flow Rate (L/min): 4 L/min  IO: Intake/output summary:  Intake/Output Summary (Last 24 hours) at 02/07/2023 1326 Last data filed at 02/07/2023 1103 Gross per 24 hour  Intake 250.03 ml  Output 1950 ml  Net -1699.97 ml    LBM: Last BM Date : 02/05/23 Baseline Weight: Weight: 73 kg Most recent weight: Weight: 75.8 kg       Thank you for this consult. Palliative medicine will continue to follow and assist as needed.  Time Total: 90 minutes Greater than 50%  of this time was spent counseling and coordinating care related to the above assessment and plan.  Signed by: Ocie Bob, AGNP-C Palliative Medicine    Please contact Palliative Medicine Team phone at [phone] for questions and concerns.  For individual provider: See Loretha Stapler

## 2023-02-07 NOTE — Progress Notes (Signed)
NAME:  Denise Underwood, MRN:  [id number], DOB:  [birth year], LOS: 5 ADMISSION DATE:  02/02/2023, CONSULTATION DATE:  02/02/2023 REFERRING MD:  Eber Hong, MD EDP, CHIEF COMPLAINT:  acute respiratory failure    History of Present Illness:  77 year old female with past medical history of hypertension, hyperlipidemia, pre-diabetes, arthritis, leukocytosis, COPD, tobacco use who presented to [facility] emergency department on 02/02/23 for shortness of breath. Noted to have 1-2 days of worsening shortness of breath and found to have room air sat of 76% by EMS. Noted to be dyspneic and unable to even stand at home. In the ED, tachypneic, new atrial fibrillation with RVR,. She was placed on oxygen. Cardizem drip was started and the patient converted to normal sinus rhythm. Subsequently, she continued to be hypotensive and 2L IVF were ordered and eventually required vasopressor support. EDP placed central line. Additionally, her laboratory work up showed new WBC 34.5, Cl 93, BUN/Cr 46/ 2.21, AST 768, ALT 445, troponin 84>109>117, BNP 697, lactic wnl. UA without UTI. BCX sent. CXR showing edema vs atypical infection, CT AP showing RLL infilatrate, small right pleural effusion, no acute intraabdominal findings. ABG ordered showed pH 7.15, pCO2 96, pO2 161. Placed on BiPAP. Repeat 7.19/77/103. She was started on Rocephin, Zithromax.  PCCM was consulted for admission.   Pertinent  Medical History  hypertension, hyperlipidemia, pre-diabetes, arthritis, leukocytosis, COPD, tobacco use  Significant Hospital Events: Including procedures, antibiotic start and stop dates in addition to other pertinent events   9/23: admitted to [facility] 9/24: no acute events overnight. She was anxious per overnight notes and was eventually started on precedex gtt to help with BiPAP.  9/26 minimally responsive early AM. Reportedly had worn BiPAP overnight, but was also agitated and received olanzapine injection.   Interim History /  Subjective:  Remains off bipap, feels well today. Her breathing is improving.  Has not been OOB yet, but wants to try.  Afebrile.  Objective   Blood pressure (!) 129/53, pulse 67, temperature 97.6 F (36.4 C), temperature source Oral, resp. rate (!) 21, height 5\' 4"  (1.626 m), weight 75.8 kg, SpO2 100%.        Intake/Output Summary (Last 24 hours) at 02/07/2023 0849 Last data filed at 02/07/2023 0600 Gross per 24 hour  Intake 350.03 ml  Output 1550 ml  Net -1199.97 ml   Filed Weights   02/05/23 0703 02/06/23 0245 02/07/23 0500  Weight: 78.1 kg 79.9 kg 75.8 kg    Examination: General: elderly woman lying in bed no acute distress HENT: [place]/AT, eyes anicteric Lungs: Breathing comfortably on nasal cannula, CTAB.  No wheezing or rhonchi. Cardiovascular: S1-S2, regular rate and rhythm Abdomen: Soft, nontender Extremities: No significant edema, no cyanosis Neuro: Awake, alert, answering questions properly moving extremities  Potassium 3.0 BUN 46 Creatinine 1.2 AST 52 ALT 139 WBC 31.8 H/H12.3/38.4 Platelets 185 Blood cultures-no growth, final   Resolved Hospital Problem list   Shock  Assessment & Plan:  Sespis from community acquired pneumonia in RLL.  RVP negative.  Sepsis complicated by acute encephalopathy, AKI. RVP, MRSA PCR, urine legionella/strep all negative -completed 5 days of CAP treatment 9/22- 9/26; monitor today off antibiotics  Acute metabolic encephalopathy: improved. - BiPAP PRN; no wheezing on exam currently -nocturnal BiPAP; needs to be discharged on chronic NIPPV due to high risk of decline and repeat hospitalization without treatment of ventilatory limitation chronically -avoid sedating meds -progressive mobility-- PT, OT re-consulted.   Acute hypoxic and hypercarbic respiratory failure 2/2 RLL PNA, COPD  AE Small RLL pleural effusion  Underlying COPD and tobacco use. -Con't triple inhaled therapy, recommend discharge on triple therapy. Does not  appear she was on inhalers PTA. -Con't steroids, decrease to once daily dosing today. -wean O2  Atrial Fibrillation, new-onset; CHA2DS2VASc 5 Elevated troponin, BNP - likely demand. Back in NSR. Echo with normal LV, enlarged RV -per primary team, cardiology  Elevated LFTs ; likely shock liver. Hepatitis panel negative. RUQ [place] without acute findings.  - per primary  Acute Kidney Injury secondary to septic shock. ATN. Improving -per primary  Hyperglycemia: likely steroid induced  -per primary; may need to decrease insulin with decrease steroids today  Tobacco use  -recommend cessation -high dose nicotine patch   Best Practice (right click and "Reselect all SmartList Selections" daily)   Per TRH Code Status:  full code Last date of multidisciplinary goals of care discussion [discussed plan of care with patient at bedside on arrival]  Labs   CBC: Recent Labs  Lab 02/02/23 1949 02/03/23 1829 02/04/23 0459 02/07/23 0605  WBC 34.5* 39.3* 37.4* 31.8*  NEUTROABS 30.0* 37.7*  --   --   HGB 12.1 11.1* 11.1* 12.3  HCT 38.5 36.8 35.7* 38.4  MCV 110.0* 112.9* 110.9* 104.1*  PLT 232 212 230 185    Basic Metabolic Panel: Recent Labs  Lab 02/02/23 1949 02/03/23 1029 02/03/23 1829 02/04/23 0459 02/05/23 0510 02/06/23 0509 02/07/23 0554  NA 136  --  140 141 135 139 141  K 4.9  --  4.7 4.7 4.3 3.7 3.0*  CL 93*  --  99 101 97* 97* 91*  CO2 28  --  29 29 28  32 40*  GLUCOSE 189*  --  171* 135* 126* 122* 153*  BUN 46*  --  47* 50* 54* 53* 46*  CREATININE 2.21*  --  2.18* 1.85* 1.72* 1.54* 1.20*  CALCIUM 9.0  --  8.6* 8.8* 8.9 9.6 9.4  MG 2.1 1.9  --  2.1  --   --  1.6*   GFR: Estimated Creatinine Clearance: 39.1 mL/min (A) (by C-G formula based on SCr of 1.2 mg/dL (H)). Recent Labs  Lab 02/02/23 1949 02/02/23 2159 02/03/23 1829 02/04/23 0459 02/07/23 0605  WBC 34.5*  --  39.3* 37.4* 31.8*  LATICACIDVEN 1.8 1.3  --   --   --       ABG    Component Value  Date/Time   PHART 7.35 02/06/2023 0811   PCO2ART 78 (HH) 02/06/2023 0811   PO2ART 97 02/06/2023 0811   HCO3 43.1 (H) 02/06/2023 0811   ACIDBASEDEF 0.9 02/03/2023 0132   O2SAT 98.9 02/06/2023 0811        Steffanie Dunn, DO 02/07/23 9:02 AM [facility]  For contact information, see Amion. If no response to pager, please call PCCM consult pager. After hours, 7PM- 7AM, please call Elink.

## 2023-02-07 NOTE — Progress Notes (Signed)
   02/07/23 2321  BiPAP/CPAP/SIPAP  Reason BIPAP/CPAP not in use Non-compliant

## 2023-02-07 NOTE — Plan of Care (Signed)

## 2023-02-07 NOTE — Progress Notes (Signed)
Rounding Note    Patient Name: Denise Underwood Date of Encounter: 02/07/2023   [facility]Care Cardiologist: Little Ishikawa, MD   Subjective   BP 142/81.  Net -1.2 L yesterday, renal function improving (1.72 > 1.54 > 1.20).  Much more alert today.  Denies any chest pain or dyspnea.  Inpatient Medications    Scheduled Meds:  apixaban  5 mg Oral BID   arformoterol  15 mcg Nebulization BID   budesonide (PULMICORT) nebulizer solution  0.5 mg Nebulization BID   Chlorhexidine Gluconate Cloth  6 each Topical Daily   diltiazem  60 mg Oral Q6H   hydrocortisone sod succinate (SOLU-CORTEF) inj  50 mg Intravenous Daily   metoprolol tartrate  12.5 mg Oral BID   nicotine  21 mg Transdermal Daily   nystatin   Topical BID   mouth rinse  15 mL Mouth Rinse 4 times per day   [START ON 02/08/2023] pantoprazole  40 mg Oral Daily   revefenacin  175 mcg Nebulization Daily   traMADol  50 mg Oral Daily   traZODone  50 mg Oral QHS   Continuous Infusions:  sodium chloride Stopped (02/04/23 0648)   magnesium sulfate 4 g in dextrose 5 % 250 mL 4 g (02/07/23 1038)   PRN Meds: docusate sodium, levalbuterol, mouth rinse, polyethylene glycol, polyvinyl alcohol, traMADol   Vital Signs    Vitals:   02/07/23 0824 02/07/23 0908 02/07/23 0921 02/07/23 0942  BP:    (!) 142/81  Pulse: 67 74 79 78  Resp: (!) 21 20 (!) 25   Temp:      TempSrc:      SpO2: 100% 94% (!) 88% 94%  Weight:      Height:        Intake/Output Summary (Last 24 hours) at 02/07/2023 1042 Last data filed at 02/07/2023 0600 Gross per 24 hour  Intake 350.03 ml  Output 1550 ml  Net -1199.97 ml      02/07/2023    5:00 AM 02/06/2023    2:45 AM 02/05/2023    7:03 AM  Last 3 Weights  Weight (lbs) 167 lb 1.7 oz 176 lb 2.4 oz 172 lb 2.9 oz  Weight (kg) 75.8 kg 79.9 kg 78.1 kg      Telemetry    Normal sinus rhythm- Personally Reviewed  ECG    No new ECG - Personally Reviewed  Physical Exam   GEN: On  BiPAP Neck: + JVD Cardiac: Regular rate and rhythm no murmurs, rubs, or gallops.  Respiratory: Diminished breath sounds GI: Soft, nontender, non-distended  MS: Trace edema Neuro: Somnolent, but arousable Psych: Unable to assess  Labs    High Sensitivity Troponin:   Recent Labs  Lab 02/02/23 1949 02/02/23 2155 02/03/23 0210 02/03/23 1029 02/03/23 1251  TROPONINIHS 84* 109* 117* 106* 102*     Chemistry Recent Labs  Lab 02/03/23 1029 02/03/23 1829 02/04/23 0459 02/05/23 0510 02/06/23 0509 02/07/23 0554  NA  --    < > 141 135 139 141  K  --    < > 4.7 4.3 3.7 3.0*  CL  --    < > 101 97* 97* 91*  CO2  --    < > 29 28 32 40*  GLUCOSE  --    < > 135* 126* 122* 153*  BUN  --    < > 50* 54* 53* 46*  CREATININE  --    < > 1.85* 1.72* 1.54* 1.20*  CALCIUM  --    < >  8.8* 8.9 9.6 9.4  MG 1.9  --  2.1  --   --  1.6*  PROT  --    < > 6.4* 6.2* 6.5 6.2*  ALBUMIN  --    < > 2.5* 2.5* 2.5* 2.4*  AST  --    < > 403* 215* 108* 52*  ALT  --    < > 335* 265* 198* 139*  ALKPHOS  --    < > 122 136* 122 105  BILITOT  --    < > 0.8 0.7 0.9 0.8  GFRNONAA  --    < > 28* 30* 35* 47*  ANIONGAP  --    < > 11 10 10 10    < > = values in this interval not displayed.    Lipids No results for input(s): "CHOL", "TRIG", "HDL", "LABVLDL", "LDLCALC", "CHOLHDL" in the last 168 hours.  Hematology Recent Labs  Lab 02/03/23 1829 02/04/23 0459 02/07/23 0605  WBC 39.3* 37.4* 31.8*  RBC 3.26* 3.22* 3.69*  HGB 11.1* 11.1* 12.3  HCT 36.8 35.7* 38.4  MCV 112.9* 110.9* 104.1*  MCH 34.0 34.5* 33.3  MCHC 30.2 31.1 32.0  RDW 13.5 13.4 13.2  PLT 212 230 185   Thyroid  Recent Labs  Lab 02/06/23 0813  TSH 0.256*    BNP Recent Labs  Lab 02/02/23 1947  BNP 697.0*    DDimer No results for input(s): "DDIMER" in the last 168 hours.   Radiology    No results found.  Cardiac Studies     Patient Profile     77 y.o. female with a hx of HTN, HLD, prediabetes, arthritis, COPD, tobacco use who  is being seen 02/05/2023 for the evaluation of atrial fibrillation   Assessment & Plan    Paroxysmal atrial fibrillation: Presented to the ED on 01/2021, found to be in new onset A-fib.  Admitted with pneumonia and septic shock.  Echocardiogram showed normal biventricular function, no significant valvular disease.  CHA2DS2-VASc 5 (CHF, hypertension, age x 2, female) -Continue Eliquis 5 mg twice daily -Continue p.o. diltiazem 60 mg every 6 hours.  Will plan to consolidate to long-acting diltiazem prior to discharge.  Can continue metoprolol 12.5 mg twice daily as well.   -Suspect A-fib is driven by pneumonia and will improve as recovers from underlying illness.  She has converted to sinus rhythm  Troponin elevation: Troponin 84 > 109 > 117 > 106 > 102.  She denies any chest pain.  Echo with normal systolic function.  Suspect demand ischemia in setting of sepsis and A-fib with RVR.  No plans for ischemic evaluation at this time  Acute on chronic diastolic heart failure: BNP 677 on 9/22.  Chest x-ray with interstitial edema versus atypical infection.  Net positive this admission due to IV fluids she received for sepsis.  She has CVC in place, transduced CVP 9/25 and was 11.  Has been on lasix 40 mg daily, will continue  For questions or updates, please contact [facility] Please consult [email or web address] for contact info under        Signed, Little Ishikawa, MD  02/07/2023, 10:42 AM

## 2023-02-07 NOTE — Progress Notes (Signed)
[facility]                                                                              Denise Underwood, is a 77 y.o. female, DOB - [birth year], ZOX:[id number] Admit date - 02/02/2023    Outpatient Primary MD for the patient is [name], [name], FNP  LOS - 5  days  Chief Complaint  Patient presents with   Shortness of Breath   Altered Mental Status       Brief summary   Patient is a 77 year old female with HTN, HLP, prediabetes, arthritis, COPD, tobacco use presented to [name], ED on 02/02/2023 with shortness of breath.  Patient had noted 1 to 2 days of worsening shortness of breath, was found hypoxic with O2 sat 76% by EMS, dyspneic, tachypneic and unable to even stand at home. In ED, was found to have new atrial fibrillation with RVR, was placed on O2, Cardizem drip was started, patient converted to NSR. Subsequently noted to be hypotensive, received IV fluids and eventually required vasopressor support.  Patient was transferred and by PCCM to ICU on 02/03/2023  CTAP showed right lower lung infiltrates, small right pleural effusion.  ABG showed pH 7.15, pCO2 96, pO2 161, was placed on BiPAP and IV antibiotics. WBCs 34.5, creatinine 2.21, AST 760, ALT 445, troponin 84> 117.  BNP 697.  Significant Hospital Events:   9/23: admitted to [facility] 9/24: no acute events overnight. She was anxious per overnight notes and was eventually started on precedex gtt to help with BiPAP.   PCCM requested [facility] to assume care on 02/05/2023.  Assessment & Plan    Principal Problem:   Septic shock (HCC), community-acquired pneumonia -CT abdomen pelvis showed right lower lung infiltrates, UA negative for UTI, no intra-abdominal pathology. -Chest x-ray 02/04/2023 with worsening RLL infiltrates -Off vasopressors, blood cultures 9/22 negative so far -Flu, RSV, COVID-negative, respiratory virus panel negative, urine strept antigen negative, urine Legionella antigen in process -Completed IV  ceftriaxone and Zithromax x 5 days -Sepsis physiology improving   Active Problems:   Acute respiratory failure with hypoxia and hypercapnia (HCC) Community-acquired pneumonia, underlying COPD, tobacco use, RLL pleural effusion -Not on O2 at baseline -Overnight refused BiPAP, was placed on 6 L O2 via HFNC -Much more alert and oriented today, close to her baseline, wean O2 as tolerated -Pulmonology following, continue Pulmicort, Brovana, Yupelri, Xopenex, flutter valve -Solu-Cortef 50 mg tapered today to daily  - may need BiPAP, nocturnal for home    New onset atrial fibrillation with RVR (HCC), elevated troponins Volume overload with acute diastolic CHF -Elevated troponins likely due to demand ischemia from respiratory failure, septic shock -2D echo showed EF 60 to 65%, G1 DD, normal right ventricular systolic function -On IV Lasix, cardiology following -HR controlled, continue p.o. diltiazem 60 mg every 6 hours, metoprolol 12.5 mg twice daily -CHA2DS2-VASc 5, continue apixaban  Acute metabolic encephalopathy -Overnight received IM Zyprexa for agitation, now lethargic and somnolent -Likely due to hypercarbia ABG showed pH 7.3, pCO2 78, pO2 97 -Ammonia level normal 25, B12 normal, folate normal, TSH 0.2 -Placed on BiPAP -Avoid sedating meds  Elevated acute transaminitis,  shock liver -Likely due to septic shock, AST 768, ALT 445 on admission -LFTs improving     AKI (acute kidney injury) (HCC) -Creatinine 2.21 on admission, was 1.1 on 12/25/2022 -Creatinine improving, 1.2 today  Hypokalemia, hypomagnesemia -Replaced  Pressure injury documentation -Lower coccyx medial stage I, POA -Wound care per nursing  Hyperlipidemia -Continue to hold statins due to acute transaminitis  Hypertension -Continue Cardizem, beta-blocker, BP stable  Obesity Estimated body mass index is 28.68 kg/m as calculated from the following:   Height as of this encounter: 5\' 4"  (1.626 m).   Weight  as of this encounter: 75.8 kg.  Code Status: Full CODE STATUS DVT Prophylaxis:   apixaban (ELIQUIS) tablet 5 mg   Level of Care: Level of care: ICU Family Communication: Updated patient.  No family at the bedside Disposition Plan:      Remains inpatient appropriate:       Procedures:  BiPAP  Consultants:   Patient was admitted by [facility]  Antimicrobials:   Anti-infectives (From admission, onward)    Start     Dose/Rate Route Frequency Ordered Stop   02/03/23 1130  cefTRIAXone (ROCEPHIN) 2 g in sodium chloride 0.9 % 100 mL IVPB  Status:  Discontinued        2 g 200 mL/hr over 30 Minutes Intravenous Every 24 hours 02/03/23 1032 02/07/23 1002   02/02/23 2045  azithromycin (ZITHROMAX) 500 mg in sodium chloride 0.9 % 250 mL IVPB  Status:  Discontinued        500 mg 250 mL/hr over 60 Minutes Intravenous Every 24 hours 02/02/23 2032 02/07/23 1002   02/02/23 2030  cefTRIAXone (ROCEPHIN) 1 g in sodium chloride 0.9 % 100 mL IVPB        1 g 200 mL/hr over 30 Minutes Intravenous  Once 02/02/23 2016 02/02/23 2112          Medications  apixaban  5 mg Oral BID   arformoterol  15 mcg Nebulization BID   budesonide (PULMICORT) nebulizer solution  0.5 mg Nebulization BID   Chlorhexidine Gluconate Cloth  6 each Topical Daily   diltiazem  60 mg Oral Q6H   furosemide  40 mg Intravenous Once   hydrocortisone sod succinate (SOLU-CORTEF) inj  50 mg Intravenous Daily   metoprolol tartrate  12.5 mg Oral BID   nicotine  21 mg Transdermal Daily   nystatin   Topical BID   mouth rinse  15 mL Mouth Rinse 4 times per day   [START ON 02/08/2023] pantoprazole  40 mg Oral Daily   potassium chloride  40 mEq Oral Once   revefenacin  175 mcg Nebulization Daily   traMADol  50 mg Oral Daily   traZODone  50 mg Oral QHS      Subjective:   Denise Underwood was seen and examined today.  Overall improving, much more alert and oriented today, mental status improving.  States shortness of breath is  much better now, no chest pain.  Overnight refused BiPAP, on 6 L O2 via HFNC this morning.  Following verbal commands.  Heart rate controlled.  Does not like the bed and has not been out of bed yet.  Objective:   Vitals:   02/07/23 0824 02/07/23 0908 02/07/23 0921 02/07/23 0942  BP:    (!) 142/81  Pulse: 67 74 79 78  Resp: (!) 21 20 (!) 25   Temp:      TempSrc:      SpO2: 100% 94% (!) 88% 94%  Weight:  Height:        Intake/Output Summary (Last 24 hours) at 02/07/2023 1103 Last data filed at 02/07/2023 0600 Gross per 24 hour  Intake 350.03 ml  Output 1550 ml  Net -1199.97 ml     Wt Readings from Last 3 Encounters:  02/07/23 75.8 kg  01/10/23 72.2 kg  12/25/22 72.7 kg    Physical Exam General: Alert and oriented x 3, NAD Cardiovascular: S1 S2 clear, RRR.  Respiratory: CTAB, no wheezing, rales or rhonchi Gastrointestinal: Soft, nontender, nondistended, NBS Ext: no pedal edema bilaterally Neuro: no new deficits Psych: Normal affect       Data Reviewed:  I have personally reviewed following labs    CBC Lab Results  Component Value Date   WBC 31.8 (H) 02/07/2023   RBC 3.69 (L) 02/07/2023   HGB 12.3 02/07/2023   HCT 38.4 02/07/2023   MCV 104.1 (H) 02/07/2023   MCH 33.3 02/07/2023   PLT 185 02/07/2023   MCHC 32.0 02/07/2023   RDW 13.2 02/07/2023   LYMPHSABS 0.4 (L) 02/03/2023   MONOABS 1.2 (H) 02/03/2023   EOSABS 0.0 02/03/2023   BASOSABS 0.0 02/03/2023     Last metabolic panel Lab Results  Component Value Date   NA 141 02/07/2023   K 3.0 (L) 02/07/2023   CL 91 (L) 02/07/2023   CO2 40 (H) 02/07/2023   BUN 46 (H) 02/07/2023   CREATININE 1.20 (H) 02/07/2023   GLUCOSE 153 (H) 02/07/2023   GFRNONAA 47 (L) 02/07/2023   GFRAA 74 06/21/2020   CALCIUM 9.4 02/07/2023   PROT 6.2 (L) 02/07/2023   ALBUMIN 2.4 (L) 02/07/2023   LABGLOB 2.3 12/25/2022   AGRATIO 1.5 05/30/2022   BILITOT 0.8 02/07/2023   ALKPHOS 105 02/07/2023   AST 52 (H) 02/07/2023    ALT 139 (H) 02/07/2023   ANIONGAP 10 02/07/2023    CBG (last 3)  Recent Labs    02/06/23 1815 02/06/23 2346 02/07/23 0554  GLUCAP 212* 122* 147*      Coagulation Profile: Recent Labs  Lab 02/02/23 1949  INR 1.2     Radiology Studies: I have personally reviewed the imaging studies  No results found.     Thad Ranger M.D. [facility] 02/07/2023, 11:03 AM  Available via Epic secure chat 7am-7pm After 7 pm, please refer to night coverage provider listed on amion.

## 2023-02-07 NOTE — Evaluation (Signed)
Physical Therapy Evaluation Patient Details Name: Denise Underwood MRN: [id number] DOB: 1945-11-25 Today's Date: 02/07/2023  History of Present Illness  Denise Underwood is a 77 y.o. female presented to [name], ED on 02/02/2023 with shortness of breath, hypoxic, dyspneic, tachypneic and unable to even stand at home.  In ED, was found to have new atrial fibrillation with RVR, patient converted to NSR, noted to be hypotensive,  transferred  to ICU on 02/03/2023.  Pt's past medical history relevant for COPD, tobacco abuse, chronic leukocytosis, psoriasis and recurrent candidal intertrigo of the groin/inguinal area and inframammary areas as well as recurrent falls at home.  Clinical Impression  Pt admitted with above diagnosis.  Pt currently with functional limitations due to the deficits listed below (see PT Problem List). Pt will benefit from acute skilled PT to increase their independence and safety with mobility to allow discharge.     The patient is currently requiring min assistance for  bed mobility and mod support to transfer to  chairs using Rw.  Patient currently on 4 L HFNC. The patient reports lives alone with 2 hours of PCA daily x 5. Patient reports that  her daughter can assist patient at DC,  family not present to confirm. The patient did require cues for safety with transfers. [facility] oriented to [facility], not completely with date.  Patient will benefit from continued inpatient follow up therapy, <3 hours/day unless family available to assist 24/7 .       If plan is discharge home, recommend the following: Two people to help with walking and/or transfers;A lot of help with bathing/dressing/bathroom;Assist for transportation;Assistance with cooking/housework;Help with stairs or ramp for entrance   Can travel by private vehicle        Equipment Recommendations None recommended by PT  Recommendations for Other Services       Functional Status Assessment Patient has had a  recent decline in their functional status and demonstrates the ability to make significant improvements in function in a reasonable and predictable amount of time.     Precautions / Restrictions Precautions Precautions: Fall Precaution Comments: monitor  VS on 4 L Restrictions Weight Bearing Restrictions: No      Mobility  Bed Mobility Overal bed mobility: Needs Assistance Bed Mobility: Supine to Sit     Supine to sit: Min assist, HOB elevated, Used rails          Transfers Overall transfer level: Needs assistance Equipment used: Rolling walker (2 wheels) Transfers: Sit to/from Stand, Bed to chair/wheelchair/BSC Sit to Stand: Min assist, +2 safety/equipment   Step pivot transfers: Min assist       General transfer comment: multimodal cues for hands , able to step to [facility] then step to Recliner using RW    Ambulation/Gait                  Stairs            Wheelchair Mobility     Tilt Bed    Modified Rankin (Stroke Patients Only)       Balance Overall balance assessment: Needs assistance, History of Falls Sitting-balance support: Single extremity supported Sitting balance-Leahy Scale: Fair     Standing balance support: Bilateral upper extremity supported, During functional activity, Reliant on assistive device for balance Standing balance-Leahy Scale: Poor                               Pertinent  Vitals/Pain Pain Assessment Pain Assessment: Faces Faces Pain Scale: Hurts a little bit Pain Location: Mid-back Pain Descriptors / Indicators: Grimacing Pain Intervention(s): Repositioned, Monitored during session    Home Living Family/patient expects to be discharged to:: Unsure Living Arrangements: Alone Available Help at Discharge: Personal care attendant;Family Type of Home: Apartment Home Access: Level entry       Home Layout: One level Home Equipment: Agricultural consultant (2 wheels);Rollator (4 wheels);BSC/3in1 Additional  Comments: PCA 2  hours /day x 5  x wk    Prior Function Prior Level of Function : Needs assist;Patient poor historian/Family not available       Physical Assist : ADLs (physical)   ADLs (physical): IADLs Mobility Comments: uses a Rollator, reports multiple falls ADLs Comments: Pt's PCA or daughter assist with transportation, meal prep, housekeeping and laundry at baseline.     Extremity/Trunk Assessment   Upper Extremity Assessment Upper Extremity Assessment: Generalized weakness    Lower Extremity Assessment Lower Extremity Assessment: Generalized weakness    Cervical / Trunk Assessment Cervical / Trunk Assessment: Normal  Communication   Communication Communication: Difficulty following commands/understanding Following commands: Follows one step commands consistently  Cognition Arousal: Alert Behavior During Therapy: WFL for tasks assessed/performed Overall Cognitive Status: No family/caregiver present to determine baseline cognitive functioning                                 General Comments: Grossly Oriented to all (Month as the "10th)  with cues to expand answers for situation. Delayed processing.        General Comments      Exercises     Assessment/Plan    PT Assessment Patient needs continued PT services  PT Problem List Decreased strength;Decreased activity tolerance;Decreased mobility;Decreased safety awareness;Decreased balance;Decreased knowledge of use of DME       PT Treatment Interventions DME instruction;Therapeutic activities;Gait training;Functional mobility training;Therapeutic exercise;Patient/family education;Cognitive remediation    PT Goals (Current goals can be found in the Care Plan section)  Acute Rehab PT Goals Patient Stated Goal: to go home PT Goal Formulation: With patient Time For Goal Achievement: 02/21/23 Potential to Achieve Goals: Fair    Frequency Min 1X/week     Co-evaluation PT/OT/SLP  Co-Evaluation/Treatment: Yes Reason for Co-Treatment: Complexity of the patient's impairments (multi-system involvement);For patient/therapist safety;To address functional/ADL transfers PT goals addressed during session: Mobility/safety with mobility OT goals addressed during session: ADL's and self-care       AM-PAC PT "6 Clicks" Mobility  Outcome Measure Help needed turning from your back to your side while in a flat bed without using bedrails?: A Little Help needed moving from lying on your back to sitting on the side of a flat bed without using bedrails?: A Little Help needed moving to and from a bed to a chair (including a wheelchair)?: A Lot Help needed standing up from a chair using your arms (e.g., wheelchair or bedside chair)?: A Lot Help needed to walk in hospital room?: Total Help needed climbing 3-5 steps with a railing? : Total 6 Click Score: 12    End of Session Equipment Utilized During Treatment: Gait belt Activity Tolerance: Patient tolerated treatment well Patient left: in chair;with call bell/phone within reach;with nursing/sitter in room;with chair alarm set Nurse Communication: Mobility status PT Visit Diagnosis: Unsteadiness on feet (R26.81);Muscle weakness (generalized) (M62.81)    Time: 6644-0347 PT Time Calculation (min) (ACUTE ONLY): 28 min   Charges:   PT  Evaluation $PT Eval Low Complexity: 1 Low   PT General Charges $$ ACUTE PT VISIT: 1 Visit         Blanchard Kelch PT Acute Rehabilitation Services Office [phone] Weekend pager-[phone]   Rada Hay 02/07/2023, 1:47 PM

## 2023-02-07 NOTE — Progress Notes (Signed)
  Received a page from nursing staff that patient developed tachycardia with rhythm concerning for afib. Our heartcare team has been rounding on patient and has continued PO Diltiazem 60mg  q6hrs. She has also continued with Metoprolol 12.5mg  BID and had reportedly been maintaining NSR today until this evening. ECG shows atrial flutter with ~3:1 conduction and ventricular rate around 105. Appears that primary team ordered IV lopressor and patient's current ventricular rates in the 60s-80s with BP 132/75 mmHg. Will increase Metoprolol Tartrate dosing to 25mg  BID. Primary team should be made aware of this change as well. Can continue PRN toprol IV if needed. Amiodarone also an option for rate control if patient's BP becomes too low for uptitration of CCB or BB.  Perlie Gold, PA-C

## 2023-02-07 NOTE — Evaluation (Signed)
Occupational Therapy Evaluation Patient Details Name: Denise Underwood MRN: [id number] DOB: [birth year] Today's Date: 02/07/2023   History of Present Illness Denise Underwood is a 77 y.o. female presented to [name], ED on 02/02/2023 with shortness of breath.  Patient had noted 1 to 2 days of worsening shortness of breath, was found hypoxic with O2 sat 76% by EMS, dyspneic, tachypneic and unable to even stand at home.  In ED, was found to have new atrial fibrillation with RVR, was placed on O2, Cardizem drip was started, patient converted to NSR.  Subsequently noted to be hypotensive, received IV fluids and eventually required vasopressor support.  Patient was transferred and by PCCM to ICU on 02/03/2023.  Pt's past medical history relevant for COPD, tobacco abuse, chronic leukocytosis, psoriasis and recurrent candidal intertrigo of the groin/inguinal area and inframammary areas as well as recurrent falls at home.   Clinical Impression   Patient is currently requiring assistance with ADLs including up to Max-Total assist with Lower body ADLs, up to minimal assist with seated Upper body ADLs,  as well as  Minimal assist with bed mobility and Min assist with functional transfers to [facility] and recliner using stand/pivot and RW.    Current level of function is below patient's typical baseline.  During this evaluation, patient was limited by generalized weakness, impaired activity tolerance, and mild cognitive deficits, as well as mid-back pain, all of which has the potential to impact patient's safety and independence during functional mobility, as well as performance for ADLs.  Patient lives alone and has a PCA 2 hours a day M-F for IADL assistance, and has a daughter nearby who may be able to provide a high level of supervision and assistance, but will need to confirm this with daughter who was not present today.   Patient demonstrates good rehab potential, and should benefit from continued skilled occupational  therapy services while in acute care to maximize safety, independence and quality of life at home.  Continued occupational therapy services are recommended.  ?        If plan is discharge home, recommend the following: A little help with walking and/or transfers;A lot of help with bathing/dressing/bathroom;Supervision due to cognitive status;Direct supervision/assist for medications management;Direct supervision/assist for financial management;Assist for transportation;Help with stairs or ramp for entrance;Assistance with cooking/housework    Functional Status Assessment  Patient has had a recent decline in their functional status and demonstrates the ability to make significant improvements in function in a reasonable and predictable amount of time.  Equipment Recommendations   (defer for now)    Recommendations for Other Services       Precautions / Restrictions Precautions Precautions: Fall Restrictions Weight Bearing Restrictions: No      Mobility Bed Mobility Overal bed mobility: Needs Assistance Bed Mobility: Supine to Sit     Supine to sit: Min assist, HOB elevated, Used rails          Transfers                          Balance Overall balance assessment: Needs assistance Sitting-balance support: Single extremity supported Sitting balance-Leahy Scale: Fair     Standing balance support: Bilateral upper extremity supported, During functional activity, Reliant on assistive device for balance Standing balance-Leahy Scale: Poor                             ADL either performed  or assessed with clinical judgement   ADL Overall ADL's : Needs assistance/impaired Eating/Feeding: Sitting;Set up   Grooming: Set up;Sitting Grooming Details (indicate cue type and reason): May need increased assisatnce due to fatigue Upper Body Bathing: Contact guard assist;Sitting   Lower Body Bathing: Minimal assistance;Moderate assistance;Sitting/lateral  leans;Sit to/from stand   Upper Body Dressing : Contact guard assist;Set up;Sitting Upper Body Dressing Details (indicate cue type and reason): Assist for lines. Lower Body Dressing: Maximal assistance;Sit to/from stand;Sitting/lateral leans Lower Body Dressing Details (indicate cue type and reason): Total Assist to don clothing over or on feet. Max As with standing dressing. Toilet Transfer: Minimal assistance;Rolling walker (2 wheels);Cueing for sequencing;Cueing for safety;Stand-pivot;BSC/3in1   Toileting- Clothing Manipulation and Hygiene: Maximal assistance;Sitting/lateral lean;Sit to/from stand Toileting - Clothing Manipulation Details (indicate cue type and reason): Pt stood from BS with Min As and required BUE support on RW with OT proviing hygiene. Pt expressed not feeling able to participate in her hygiene yet.     Functional mobility during ADLs: Minimal assistance;Rolling walker (2 wheels);Cueing for sequencing;Cueing for safety       Vision Baseline Vision/History: 1 Wears glasses (at home) Ability to See in Adequate Light: 0 Adequate       Perception         Praxis         Pertinent Vitals/Pain Pain Assessment Pain Assessment: 0-10 Pain Score:  (Pt unable to quantify or describe.) Pain Location: Mid-back Pain Descriptors / Indicators: Grimacing Pain Intervention(s): Limited activity within patient's tolerance, Monitored during session, Repositioned     Extremity/Trunk Assessment Upper Extremity Assessment Upper Extremity Assessment: Generalized weakness   Lower Extremity Assessment Lower Extremity Assessment: Defer to PT evaluation       Communication Communication Communication: Difficulty following commands/understanding Following commands: Follows one step commands consistently;Follows multi-step commands with increased time   Cognition Arousal: Alert Behavior During Therapy: WFL for tasks assessed/performed Overall Cognitive Status: No  family/caregiver present to determine baseline cognitive functioning                                 General Comments: Grossly Oriented to all (Month as the "10th)  with cues to expand answers for situation. Delayed processing.     General Comments       Exercises     Shoulder Instructions      Home Living Family/patient expects to be discharged to:: Unsure Living Arrangements: Alone Available Help at Discharge: Personal care attendant;Family (Pt reports that her daughter works from home and can likely stay with pt if needed, but need to confirm) Type of Home: Apartment Home Access: Level entry     Home Layout: One level               Home Equipment: Agricultural consultant (2 wheels);Rollator (4 wheels);BSC/3in1          Prior Functioning/Environment Prior Level of Function : Needs assist;Patient poor historian/Family not available (Pt having some struggles with history and word finding)       Physical Assist : ADLs (physical)   ADLs (physical): IADLs   ADLs Comments: Pt's PCA or daughter assist with transportation, meal prep, housekeeping and laundry at baseline.        OT Problem List: Decreased activity tolerance;Decreased cognition;Decreased strength;Obesity;Pain;Decreased knowledge of use of DME or AE;Impaired balance (sitting and/or standing);Cardiopulmonary status limiting activity      OT Treatment/Interventions: Self-care/ADL training;Therapeutic activities;Cognitive remediation/compensation;Therapeutic exercise;Patient/family education;Energy conservation;Balance  training;DME and/or AE instruction    OT Goals(Current goals can be found in the care plan section) Acute Rehab OT Goals Patient Stated Goal: To go home OT Goal Formulation: With patient Time For Goal Achievement: 02/21/23 Potential to Achieve Goals: Good ADL Goals Pt Will Perform Lower Body Dressing: with supervision;with set-up;with adaptive equipment Pt Will Transfer to Toilet:  ambulating;with supervision Pt Will Perform Toileting - Clothing Manipulation and hygiene: with supervision;with adaptive equipment;sitting/lateral leans;sit to/from stand (AE training if needed) Pt/caregiver will Perform Home Exercise Program: Increased strength;Both right and left upper extremity;With Supervision Additional ADL Goal #1: Pt will perform any 2 full ADLs with supervision and RPE no greater than 5/10 to show improved activity tolerance needed for self care at home. Additional ADL Goal #2: Patient will identify at least 3 energy conservation strategies to employ at home in order to maximize function and quality of life and decrease caregiver burden while preventing exacerbation of symptoms and rehospitalization. Additional ADL Goal #3: Patient will identify at least 3 fall prevention strategies to employ at home in order to maximize function and safety during ADLs and decrease caregiver burden while preventing possible injury and rehospitalization.  OT Frequency: Min 1X/week    Co-evaluation PT/OT/SLP Co-Evaluation/Treatment: Yes Reason for Co-Treatment: Complexity of the patient's impairments (multi-system involvement);For patient/therapist safety;To address functional/ADL transfers PT goals addressed during session: Mobility/safety with mobility OT goals addressed during session: ADL's and self-care      AM-PAC OT "6 Clicks" Daily Activity     Outcome Measure Help from another person eating meals?: A Little Help from another person taking care of personal grooming?: A Little Help from another person toileting, which includes using toliet, bedpan, or urinal?: A Lot Help from another person bathing (including washing, rinsing, drying)?: A Lot Help from another person to put on and taking off regular upper body clothing?: A Little Help from another person to put on and taking off regular lower body clothing?: A Lot 6 Click Score: 15   End of Session Equipment Utilized During  Treatment: Gait belt;Rolling walker (2 wheels);Oxygen Nurse Communication: Mobility status  Activity Tolerance: Patient limited by fatigue Patient left: in chair;with call bell/phone within reach;with nursing/sitter in room  OT Visit Diagnosis: Unsteadiness on feet (R26.81);Muscle weakness (generalized) (M62.81);Pain;Other symptoms and signs involving cognitive function Pain - part of body:  (mid-back)                Time: 8413-2440 OT Time Calculation (min): 26 min Charges:  OT General Charges $OT Visit: 1 Visit OT Evaluation $OT Eval Low Complexity: 1 Low  Braidyn Scorsone, OT Acute Rehab Services Office: [phone] 02/07/2023  Theodoro Clock 02/07/2023, 1:13 PM

## 2023-02-08 DIAGNOSIS — J9601 Acute respiratory failure with hypoxia: Secondary | ICD-10-CM | POA: Diagnosis not present

## 2023-02-08 DIAGNOSIS — A419 Sepsis, unspecified organism: Secondary | ICD-10-CM | POA: Diagnosis not present

## 2023-02-08 DIAGNOSIS — R6521 Severe sepsis with septic shock: Secondary | ICD-10-CM | POA: Diagnosis not present

## 2023-02-08 DIAGNOSIS — I4891 Unspecified atrial fibrillation: Secondary | ICD-10-CM | POA: Diagnosis not present

## 2023-02-08 DIAGNOSIS — I5033 Acute on chronic diastolic (congestive) heart failure: Secondary | ICD-10-CM | POA: Diagnosis not present

## 2023-02-08 LAB — COMPREHENSIVE METABOLIC PANEL
ALT: 101 U/L — ABNORMAL HIGH (ref 0–44)
AST: 38 U/L (ref 15–41)
Albumin: 2.7 g/dL — ABNORMAL LOW (ref 3.5–5.0)
Alkaline Phosphatase: 97 U/L (ref 38–126)
Anion gap: 15 (ref 5–15)
BUN: 42 mg/dL — ABNORMAL HIGH (ref 8–23)
CO2: 41 mmol/L — ABNORMAL HIGH (ref 22–32)
Calcium: 9.6 mg/dL (ref 8.9–10.3)
Chloride: 85 mmol/L — ABNORMAL LOW (ref 98–111)
Creatinine, Ser: 1.07 mg/dL — ABNORMAL HIGH (ref 0.44–1.00)
GFR, Estimated: 53 mL/min — ABNORMAL LOW (ref >60)
Glucose, Bld: 129 mg/dL — ABNORMAL HIGH (ref 70–99)
Potassium: 2.8 mmol/L — ABNORMAL LOW (ref 3.5–5.1)
Sodium: 141 mmol/L (ref 135–145)
Total Bilirubin: 1.3 mg/dL — ABNORMAL HIGH (ref 0.3–1.2)
Total Protein: 6.2 g/dL — ABNORMAL LOW (ref 6.5–8.1)

## 2023-02-08 LAB — BLOOD GAS, VENOUS
Acid-Base Excess: 27.1 mmol/L — ABNORMAL HIGH (ref 0.0–2.0)
Bicarbonate: 54.2 mmol/L — ABNORMAL HIGH (ref 20.0–28.0)
O2 Saturation: 92.7 %
Patient temperature: 37
pCO2, Ven: 62 mm[Hg] — ABNORMAL HIGH (ref 44–60)
pH, Ven: 7.55 — ABNORMAL HIGH (ref 7.25–7.43)
pO2, Ven: 57 mm[Hg] — ABNORMAL HIGH (ref 32–45)

## 2023-02-08 LAB — GLUCOSE, CAPILLARY
Glucose-Capillary: 128 mg/dL — ABNORMAL HIGH (ref 70–99)
Glucose-Capillary: 131 mg/dL — ABNORMAL HIGH (ref 70–99)
Glucose-Capillary: 139 mg/dL — ABNORMAL HIGH (ref 70–99)
Glucose-Capillary: 300 mg/dL — ABNORMAL HIGH (ref 70–99)

## 2023-02-08 LAB — CBC
HCT: 40.4 % (ref 36.0–46.0)
Hemoglobin: 13 g/dL (ref 12.0–15.0)
MCH: 33.7 pg (ref 26.0–34.0)
MCHC: 32.2 g/dL (ref 30.0–36.0)
MCV: 104.7 fL — ABNORMAL HIGH (ref 80.0–100.0)
Platelets: 191 10*3/uL (ref 150–400)
RBC: 3.86 MIL/uL — ABNORMAL LOW (ref 3.87–5.11)
RDW: 13.4 % (ref 11.5–15.5)
WBC: 33.3 10*3/uL — ABNORMAL HIGH (ref 4.0–10.5)
nRBC: 0.1 % (ref 0.0–0.2)

## 2023-02-08 LAB — MAGNESIUM: Magnesium: 2 mg/dL (ref 1.7–2.4)

## 2023-02-08 MED ORDER — ACETAZOLAMIDE SODIUM 500 MG IJ SOLR
500.0000 mg | Freq: Once | INTRAMUSCULAR | Status: AC
  Administered 2023-02-08: 500 mg via INTRAVENOUS
  Filled 2023-02-08: qty 500

## 2023-02-08 MED ORDER — POTASSIUM CHLORIDE CRYS ER 20 MEQ PO TBCR
40.0000 meq | EXTENDED_RELEASE_TABLET | ORAL | Status: AC
  Administered 2023-02-08 (×3): 40 meq via ORAL
  Filled 2023-02-08 (×3): qty 2

## 2023-02-08 NOTE — TOC Progression Note (Signed)
Transition of Care [facility]) - Progression Note    Patient Details  Name: Denise Underwood MRN: [id number] Date of Birth: 08-Mar-1946  Transition of Care [facility]) CM/SW Contact  Darleene Cleaver, [place] Phone Number: 02/08/2023, 6:28 PM  Clinical Narrative:     CSW spoke to patient's daughter Philis Nettle, [phone] regarding PT and OT recommendations for SNF for short term rehab.  Per patient's daughter, patient does not want to go to SNF, and would rather go home with home health.  CSW explained the benefits of going to SNF, patient's daughter stated that they would still prefer home health.  Daughter states that patient has paid caregivers that assist patient with ADLs and other tasks.  Patient's daughter [name] stated that patient's other daughter plans to stay with patient 24 hours a day once she returns home. CSW asked if patient had DME at home, per daughter, she has walker, bedside commode, and wheelchair which patient uses at home.  CSW asked if patient has ever had oxygen, because she is currently on 4L nasal cannula, patient's daughter said no.  CSW informed daughter of options, and HH agency was added to AVS.        CSW asked if she has had HH before, and she said yes.  Patient's daughter [name], could not remember the name of the company that they used in the past.  CSW asked if they had a preference for a [facility] agency, and they said no.  CSW was able to contact [place] at [facility] and she is able to accept patient for [facility] and OT.        Expected Discharge Plan and Services                                               Social Determinants of Health (SDOH) Interventions SDOH Screenings   Food Insecurity: No Food Insecurity (02/04/2023)  Housing: Low Risk  (02/04/2023)  Transportation Needs: No Transportation Needs (02/04/2023)  Utilities: Not At Risk (02/04/2023)  Alcohol Screen: Low Risk  (11/07/2022)  Depression (PHQ2-9): Medium Risk (11/19/2022)  Financial Resource Strain:  Low Risk  (11/07/2022)  Physical Activity: Insufficiently Active (11/07/2022)  Social Connections: Socially Isolated (11/07/2022)  Stress: No Stress Concern Present (11/07/2022)  Tobacco Use: High Risk (02/02/2023)    Readmission Risk Interventions    02/04/2023    5:21 PM  Readmission Risk Prevention Plan  Transportation Screening Complete  PCP or Specialist Appt within 5-7 Days Complete  Home Care Screening Complete  Medication Review (RN CM) Complete

## 2023-02-08 NOTE — Progress Notes (Signed)
NAME:  Denise Underwood, MRN:  [id number], DOB:  October 24, 1945, LOS: 6 ADMISSION DATE:  02/02/2023, CONSULTATION DATE:  02/02/2023 REFERRING MD:  Eber Hong, MD EDP, CHIEF COMPLAINT:  acute respiratory failure    History of Present Illness:  77 year old female with past medical history of hypertension, hyperlipidemia, pre-diabetes, arthritis, leukocytosis, COPD, tobacco use who presented to [facility] emergency department on 02/02/23 for shortness of breath. Noted to have 1-2 days of worsening shortness of breath and found to have room air sat of 76% by EMS. Noted to be dyspneic and unable to even stand at home. In the ED, tachypneic, new atrial fibrillation with RVR,. She was placed on oxygen. Cardizem drip was started and the patient converted to normal sinus rhythm. Subsequently, she continued to be hypotensive and 2L IVF were ordered and eventually required vasopressor support. EDP placed central line. Additionally, her laboratory work up showed new WBC 34.5, Cl 93, BUN/Cr 46/ 2.21, AST 768, ALT 445, troponin 84>109>117, BNP 697, lactic wnl. UA without UTI. BCX sent. CXR showing edema vs atypical infection, CT AP showing RLL infilatrate, small right pleural effusion, no acute intraabdominal findings. ABG ordered showed pH 7.15, pCO2 96, pO2 161. Placed on BiPAP. Repeat 7.19/77/103. She was started on Rocephin, Zithromax.  PCCM was consulted for admission.   Pertinent  Medical History  hypertension, hyperlipidemia, pre-diabetes, arthritis, leukocytosis, COPD, tobacco use  Significant Hospital Events: Including procedures, antibiotic start and stop dates in addition to other pertinent events   9/23: admitted to [facility] 9/24: no acute events overnight. She was anxious per overnight notes and was eventually started on precedex gtt to help with BiPAP.  9/26 minimally responsive early AM. Reportedly had worn BiPAP overnight, but was also agitated and received olanzapine injection.  9/28 decreased  responsiveness, refused BiPAP last night  Interim History / Subjective:  Off BiPAP Lethargic  Objective   Blood pressure (!) 149/79, pulse 85, temperature 97.8 F (36.6 C), temperature source Oral, resp. rate (!) 21, height 5\' 4"  (1.626 m), weight 71 kg, SpO2 93%.        Intake/Output Summary (Last 24 hours) at 02/08/2023 4696 Last data filed at 02/08/2023 2952 Gross per 24 hour  Intake 203.5 ml  Output 2150 ml  Net -1946.5 ml   Filed Weights   02/06/23 0245 02/07/23 0500 02/08/23 0500  Weight: 79.9 kg 75.8 kg 71 kg    Examination: General: Elderly lady, does not appear to be in distress HENT: Anicteric Lungs: Clear breath sounds, decreased air movement at the bases Cardiovascular: S1-S2 appreciated Abdomen: Soft, nontender Extremities: No edema Neuro: Awake, alert  I reviewed nursing notes, hospitalist notes, last 24 h vitals and pain scores, last 48 h intake and output, last 24 h labs and trends, and last 24 h imaging results.  Resolved Hospital Problem list   Shock  Assessment & Plan:   Community-acquired pneumonia Sepsis Respiratory viral panel, MRSA PCR, urine Legionella/streptococcal antigens negative -Completed 5 days of antibiotics -Continue to monitor off antibiotics  Acute metabolic encephalopathy -This appears to be waxing and waning -Not very compliant with BiPAP at present -May need NIPPV at home however if noncompliant, not sure if this will be helpful -Limit sedating medications -Follow-up on VBG -ABG with pCO2 of 78 on 9/26 -Give dose of Diamox for possibly contraction  Acute hypoxic/hypercarbic respiratory failure secondary to pneumonia, COPD exacerbation -Continue bronchodilators -Continue steroids, decrease  New onset atrial fibrillation -Appreciate cardiology following -Optimizing calcium channel and beta blockers  Elevated LFTs -  Continue to trend -Ultrasound liver was non-revealing  Hypokalemia -Replete -KCl ordered  Acute  kidney injury -Continue to monitor -Avoid nephrotoxic medications -Maintain renal perfusion  Tobacco abuse -Continue nicotine patch  Risk of decompensation remains high with her noncompliance  Best Practice (right click and "Reselect all SmartList Selections" daily)   Per TRH Code Status:  full code Last date of multidisciplinary goals of care discussion [discussed plan of care with patient at bedside on arrival]  Labs   CBC: Recent Labs  Lab 02/02/23 1949 02/03/23 1829 02/04/23 0459 02/07/23 0605 02/08/23 0654  WBC 34.5* 39.3* 37.4* 31.8* 33.3*  NEUTROABS 30.0* 37.7*  --   --   --   HGB 12.1 11.1* 11.1* 12.3 13.0  HCT 38.5 36.8 35.7* 38.4 40.4  MCV 110.0* 112.9* 110.9* 104.1* 104.7*  PLT 232 212 230 185 191    Basic Metabolic Panel: Recent Labs  Lab 02/02/23 1949 02/03/23 1029 02/03/23 1829 02/04/23 0459 02/05/23 0510 02/06/23 0509 02/07/23 0554 02/08/23 0654  NA 136  --    < > 141 135 139 141 141  K 4.9  --    < > 4.7 4.3 3.7 3.0* 2.8*  CL 93*  --    < > 101 97* 97* 91* 85*  CO2 28  --    < > 29 28 32 40* 41*  GLUCOSE 189*  --    < > 135* 126* 122* 153* 129*  BUN 46*  --    < > 50* 54* 53* 46* 42*  CREATININE 2.21*  --    < > 1.85* 1.72* 1.54* 1.20* 1.07*  CALCIUM 9.0  --    < > 8.8* 8.9 9.6 9.4 9.6  MG 2.1 1.9  --  2.1  --   --  1.6* 2.0   < > = values in this interval not displayed.   GFR: Estimated Creatinine Clearance: 42.5 mL/min (A) (by C-G formula based on SCr of 1.07 mg/dL (H)). Recent Labs  Lab 02/02/23 1949 02/02/23 2159 02/03/23 1829 02/04/23 0459 02/07/23 0605 02/08/23 0654  WBC 34.5*  --  39.3* 37.4* 31.8* 33.3*  LATICACIDVEN 1.8 1.3  --   --   --   --       ABG    Component Value Date/Time   PHART 7.35 02/06/2023 0811   PCO2ART 78 (HH) 02/06/2023 0811   PO2ART 97 02/06/2023 0811   HCO3 43.1 (H) 02/06/2023 0811   ACIDBASEDEF 0.9 02/03/2023 0132   O2SAT 98.9 02/06/2023 0811    Virl Diamond, MD [place] [facility] Pager: See  Loretha Stapler

## 2023-02-08 NOTE — Progress Notes (Signed)
Rounding Note    Patient Name: Denise Underwood Date of Encounter: 02/08/2023  [facility] Cardiologist: Little Ishikawa, MD   Subjective   BP 149/79.  Net -1.9 L yesterday, renal function improving (1.72 > 1.54 > 1.20>1.07).  Potassium 2.8 this morning.  Denies any chest pain or dyspnea.  Inpatient Medications    Scheduled Meds:  apixaban  5 mg Oral BID   arformoterol  15 mcg Nebulization BID   budesonide (PULMICORT) nebulizer solution  0.5 mg Nebulization BID   Chlorhexidine Gluconate Cloth  6 each Topical Daily   diltiazem  60 mg Oral Q6H   hydrocortisone sod succinate (SOLU-CORTEF) inj  50 mg Intravenous Daily   metoprolol tartrate  25 mg Oral BID   nicotine  21 mg Transdermal Daily   nystatin   Topical BID   mouth rinse  15 mL Mouth Rinse 4 times per day   pantoprazole  40 mg Oral Daily   potassium chloride  40 mEq Oral Q4H   revefenacin  175 mcg Nebulization Daily   traMADol  50 mg Oral Daily   traZODone  50 mg Oral QHS   Continuous Infusions:  sodium chloride Stopped (02/04/23 0648)   PRN Meds: docusate sodium, levalbuterol, LORazepam, mouth rinse, polyethylene glycol, polyvinyl alcohol, traMADol   Vital Signs    Vitals:   02/08/23 0500 02/08/23 0600 02/08/23 0607 02/08/23 0615  BP:  (!) 149/79  (!) 149/79  Pulse:  85    Resp:  (!) 21    Temp:   98.7 F (37.1 C)   TempSrc:   Oral   SpO2:  93%    Weight: 71 kg     Height:        Intake/Output Summary (Last 24 hours) at 02/08/2023 0756 Last data filed at 02/08/2023 0981 Gross per 24 hour  Intake 203.5 ml  Output 2150 ml  Net -1946.5 ml      02/08/2023    5:00 AM 02/07/2023    5:00 AM 02/06/2023    2:45 AM  Last 3 Weights  Weight (lbs) 156 lb 8.4 oz 167 lb 1.7 oz 176 lb 2.4 oz  Weight (kg) 71 kg 75.8 kg 79.9 kg      Telemetry    Intermittently sinus rhythm, otherwise A-fib in 90s- Personally Reviewed  ECG    No new ECG - Personally Reviewed  Physical Exam   GEN: In no  acute distress Neck: No JVD Cardiac: Regular rhythm, normal rate no murmurs, rubs, or gallops.  Respiratory: Diminished breath sounds GI: Soft, nontender, non-distended  MS: Trace edema Neuro: Alert and oriented x 3 Psych: Normal affect  Labs    High Sensitivity Troponin:   Recent Labs  Lab 02/02/23 1949 02/02/23 2155 02/03/23 0210 02/03/23 1029 02/03/23 1251  TROPONINIHS 84* 109* 117* 106* 102*     Chemistry Recent Labs  Lab 02/04/23 0459 02/05/23 0510 02/06/23 0509 02/07/23 0554 02/08/23 0654  NA 141   < > 139 141 141  K 4.7   < > 3.7 3.0* 2.8*  CL 101   < > 97* 91* 85*  CO2 29   < > 32 40* 41*  GLUCOSE 135*   < > 122* 153* 129*  BUN 50*   < > 53* 46* 42*  CREATININE 1.85*   < > 1.54* 1.20* 1.07*  CALCIUM 8.8*   < > 9.6 9.4 9.6  MG 2.1  --   --  1.6* 2.0  PROT 6.4*   < >  6.5 6.2* 6.2*  ALBUMIN 2.5*   < > 2.5* 2.4* 2.7*  AST 403*   < > 108* 52* 38  ALT 335*   < > 198* 139* 101*  ALKPHOS 122   < > 122 105 97  BILITOT 0.8   < > 0.9 0.8 1.3*  GFRNONAA 28*   < > 35* 47* 53*  ANIONGAP 11   < > 10 10 15    < > = values in this interval not displayed.    Lipids No results for input(s): "CHOL", "TRIG", "HDL", "LABVLDL", "LDLCALC", "CHOLHDL" in the last 168 hours.  Hematology Recent Labs  Lab 02/04/23 0459 02/07/23 0605 02/08/23 0654  WBC 37.4* 31.8* 33.3*  RBC 3.22* 3.69* 3.86*  HGB 11.1* 12.3 13.0  HCT 35.7* 38.4 40.4  MCV 110.9* 104.1* 104.7*  MCH 34.5* 33.3 33.7  MCHC 31.1 32.0 32.2  RDW 13.4 13.2 13.4  PLT 230 185 191   Thyroid  Recent Labs  Lab 02/06/23 0813  TSH 0.256*    BNP Recent Labs  Lab 02/02/23 1947  BNP 697.0*    DDimer No results for input(s): "DDIMER" in the last 168 hours.   Radiology    No results found.  Cardiac Studies     Patient Profile     77 y.o. female with a hx of HTN, HLD, prediabetes, arthritis, COPD, tobacco use who is being seen 02/05/2023 for the evaluation of atrial fibrillation   Assessment & Plan     Paroxysmal atrial fibrillation: Presented to the ED on 01/2021, found to be in new onset A-fib.  Admitted with pneumonia and septic shock.  Echocardiogram showed normal biventricular function, no significant valvular disease.  CHA2DS2-VASc 5 (CHF, hypertension, age x 2, female) -Continue Eliquis 5 mg twice daily -Continue p.o. diltiazem 60 mg every 6 hours.  Will plan to consolidate to long-acting diltiazem prior to discharge.  Can continue metoprolol 25 mg twice daily as well.   -Suspect A-fib is driven by pneumonia and will improve as recovers from underlying illness.  She has been intermittently in sinus rhythm  Troponin elevation: Troponin 84 > 109 > 117 > 106 > 102.  She denies any chest pain.  Echo with normal systolic function.  Suspect demand ischemia in setting of sepsis and A-fib with RVR.  No plans for ischemic evaluation at this time  Acute on chronic diastolic heart failure: BNP 677 on 9/22.  Chest x-ray with interstitial edema versus atypical infection.  Net positive this admission due to IV fluids she received for sepsis.  Had CBC in place, CVP 11 on 9/25 -Has been receiving IV Lasix 40 mg daily.  She appears euvolemic, will hold IV Lasix today.   Hypokalemia: Potassium 2.8 this morning.  Repletion ordered  For questions or updates, please contact [facility] Please consult [email or web address] for contact info under        Signed, Little Ishikawa, MD  02/08/2023, 7:56 AM

## 2023-02-08 NOTE — Progress Notes (Signed)
   02/08/23 2001  BiPAP/CPAP/SIPAP  Reason BIPAP/CPAP not in use Non-compliant  BiPAP/CPAP /SiPAP Vitals  Resp 20  SpO2 93 %  Bilateral Breath Sounds Clear;Diminished  MEWS Score/Color  MEWS Score 0  MEWS Score Color [place] Si

## 2023-02-08 NOTE — Progress Notes (Signed)
[facility]                                                                              Roxey Trescott, is a 77 y.o. female, DOB - 05-07-1946, ZOX:[id number] Admit date - 02/02/2023    Outpatient Primary MD for the patient is [name], [name], FNP  LOS - 6  days  Chief Complaint  Patient presents with   Shortness of Breath   Altered Mental Status       Brief summary   Patient is a 77 year old female with HTN, HLP, prediabetes, arthritis, COPD, tobacco use presented to [name], ED on 02/02/2023 with shortness of breath.  Patient had noted 1 to 2 days of worsening shortness of breath, was found hypoxic with O2 sat 76% by EMS, dyspneic, tachypneic and unable to even stand at home. In ED, was found to have new atrial fibrillation with RVR, was placed on O2, Cardizem drip was started, patient converted to NSR. Subsequently noted to be hypotensive, received IV fluids and eventually required vasopressor support.  Patient was transferred to [facility] by PCCM to ICU on 02/03/2023  PCCM requested [facility] to assume care on 02/05/2023.  Assessment & Plan      Septic shock (HCC), community-acquired pneumonia -CT abdomen pelvis showed right lower lung infiltrates, UA negative for UTI, no intra-abdominal pathology. -Chest x-ray 02/04/2023 with worsening RLL infiltrates -Off vasopressors, blood cultures 9/22 negative so far -Flu, RSV, COVID-negative, respiratory virus panel negative, urine strep antigen negative -Completed IV ceftriaxone and Zithromax x 5 days -Sepsis physiology improving     Acute respiratory failure with hypoxia and hypercapnia (HCC) Community-acquired pneumonia, underlying COPD, tobacco use, RLL pleural effusion -Not on O2 at baseline -continues to refuse Overnight  BiPAP, was placed on 6 L O2 via HFNC -wean O2 as tolerated -Pulmonology following, continue Pulmicort, Brovana, Yupelri, Xopenex, flutter valve -Solu-Cortef 50 mg tapered today to daily  - may need BiPAP,  nocturnal for home IF she will wear -check VBG    New onset atrial fibrillation with RVR (HCC), elevated troponins Volume overload with acute diastolic CHF -Elevated troponins likely due to demand ischemia from respiratory failure, septic shock -2D echo showed EF 60 to 65%, G1 DD, normal right ventricular systolic function -HR controlled, continue p.o. diltiazem 60 mg every 6 hours, metoprolol 12.5 mg twice daily -CHA2DS2-VASc 5, continue apixaban -cards consulted  Acute metabolic encephalopathy -Avoid sedating meds -check VBG as sleepy this AM  Elevated acute transaminitis, shock liver -Likely due to septic shock, AST 768, ALT 445 on admission -trend     AKI (acute kidney injury) (HCC) -Creatinine 2.21 on admission, was 1.1 on 12/25/2022 -Creatinine improving  Hypokalemia, hypomagnesemia -Replace  Pressure injury documentation -Lower coccyx medial stage I, POA Pressure Injury 02/03/23 Coccyx Medial;Lower Stage 1 -  Intact skin with non-blanchable redness of a localized area usually over a bony prominence. nonblanchable redness to sacrum (Active)  02/03/23 1000  Location: Coccyx  Location Orientation: Medial;Lower  Staging: Stage 1 -  Intact skin with non-blanchable redness of a localized area usually over a bony prominence.  Wound Description (Comments): nonblanchable redness to sacrum  Present  on Admission: Yes      Hyperlipidemia -Continue to hold statins due to acute transaminitis  Hypertension -Continue Cardizem, beta-blocker, BP stable  Obesity Estimated body mass index is 26.87 kg/m as calculated from the following:   Height as of this encounter: 5\' 4"  (1.626 m).   Weight as of this encounter: 71 kg.  Code Status: Full CODE STATUS DVT Prophylaxis:   apixaban (ELIQUIS) tablet 5 mg    Family Communication:   No family at the bedside Disposition Plan:      Remains inpatient appropriate:      Consultants:   Patient was admitted by  [facility]   Medications  apixaban  5 mg Oral BID   arformoterol  15 mcg Nebulization BID   budesonide (PULMICORT) nebulizer solution  0.5 mg Nebulization BID   Chlorhexidine Gluconate Cloth  6 each Topical Daily   diltiazem  60 mg Oral Q6H   hydrocortisone sod succinate (SOLU-CORTEF) inj  50 mg Intravenous Daily   metoprolol tartrate  25 mg Oral BID   nicotine  21 mg Transdermal Daily   nystatin   Topical BID   mouth rinse  15 mL Mouth Rinse 4 times per day   pantoprazole  40 mg Oral Daily   potassium chloride  40 mEq Oral Q4H   revefenacin  175 mcg Nebulization Daily   traMADol  50 mg Oral Daily   traZODone  50 mg Oral QHS      Subjective:   Sleepy but will awaken  Objective:   Vitals:   02/08/23 0600 02/08/23 0607 02/08/23 0615 02/08/23 0750  BP: (!) 149/79  (!) 149/79   Pulse: 85     Resp: (!) 21     Temp:  98.7 F (37.1 C)  97.8 F (36.6 C)  TempSrc:  Oral  Oral  SpO2: 93%     Weight:      Height:        Intake/Output Summary (Last 24 hours) at 02/08/2023 0848 Last data filed at 02/08/2023 6045 Gross per 24 hour  Intake 203.5 ml  Output 2150 ml  Net -1946.5 ml     Wt Readings from Last 3 Encounters:  02/08/23 71 kg  01/10/23 72.2 kg  12/25/22 72.7 kg    Physical Exam  General: Appearance:     Overweight female in no acute distress     Lungs:    On [place] diminished, no wheezing respirations unlabored  Heart:    Normal heart rate. irregular  MS:   All extremities are intact.   Neurologic:   Will awaken       Data Reviewed:  I have personally reviewed following labs    CBC Lab Results  Component Value Date   WBC 33.3 (H) 02/08/2023   RBC 3.86 (L) 02/08/2023   HGB 13.0 02/08/2023   HCT 40.4 02/08/2023   MCV 104.7 (H) 02/08/2023   MCH 33.7 02/08/2023   PLT 191 02/08/2023   MCHC 32.2 02/08/2023   RDW 13.4 02/08/2023   LYMPHSABS 0.4 (L) 02/03/2023   MONOABS 1.2 (H) 02/03/2023   EOSABS 0.0 02/03/2023   BASOSABS 0.0 02/03/2023      Last metabolic panel Lab Results  Component Value Date   NA 141 02/08/2023   K 2.8 (L) 02/08/2023   CL 85 (L) 02/08/2023   CO2 41 (H) 02/08/2023   BUN 42 (H) 02/08/2023   CREATININE 1.07 (H) 02/08/2023   GLUCOSE 129 (H) 02/08/2023   GFRNONAA 53 (L) 02/08/2023  GFRAA 74 06/21/2020   CALCIUM 9.6 02/08/2023   PROT 6.2 (L) 02/08/2023   ALBUMIN 2.7 (L) 02/08/2023   LABGLOB 2.3 12/25/2022   AGRATIO 1.5 05/30/2022   BILITOT 1.3 (H) 02/08/2023   ALKPHOS 97 02/08/2023   AST 38 02/08/2023   ALT 101 (H) 02/08/2023   ANIONGAP 15 02/08/2023    CBG (last 3)  Recent Labs    02/07/23 1623 02/08/23 0052 02/08/23 0607  GLUCAP 179* 128* 139*      Coagulation Profile: Recent Labs  Lab 02/02/23 1949  INR 1.2     Radiology Studies: I have personally reviewed the imaging studies  No results found.     Joseph Art DO [facility] 02/08/2023, 8:48 AM  Available via Epic secure chat 7am-7pm After 7 pm, please refer to night coverage provider listed on amion.

## 2023-02-09 DIAGNOSIS — J9601 Acute respiratory failure with hypoxia: Secondary | ICD-10-CM | POA: Diagnosis not present

## 2023-02-09 DIAGNOSIS — A419 Sepsis, unspecified organism: Secondary | ICD-10-CM | POA: Diagnosis not present

## 2023-02-09 DIAGNOSIS — I4891 Unspecified atrial fibrillation: Secondary | ICD-10-CM | POA: Diagnosis not present

## 2023-02-09 DIAGNOSIS — R6521 Severe sepsis with septic shock: Secondary | ICD-10-CM | POA: Diagnosis not present

## 2023-02-09 DIAGNOSIS — I5033 Acute on chronic diastolic (congestive) heart failure: Secondary | ICD-10-CM | POA: Diagnosis not present

## 2023-02-09 DIAGNOSIS — J9602 Acute respiratory failure with hypercapnia: Secondary | ICD-10-CM | POA: Diagnosis not present

## 2023-02-09 LAB — GLUCOSE, CAPILLARY
Glucose-Capillary: 119 mg/dL — ABNORMAL HIGH (ref 70–99)
Glucose-Capillary: 127 mg/dL — ABNORMAL HIGH (ref 70–99)
Glucose-Capillary: 138 mg/dL — ABNORMAL HIGH (ref 70–99)
Glucose-Capillary: 155 mg/dL — ABNORMAL HIGH (ref 70–99)
Glucose-Capillary: 176 mg/dL — ABNORMAL HIGH (ref 70–99)

## 2023-02-09 LAB — CBC
HCT: 40.5 % (ref 36.0–46.0)
Hemoglobin: 12.8 g/dL (ref 12.0–15.0)
MCH: 33.8 pg (ref 26.0–34.0)
MCHC: 31.6 g/dL (ref 30.0–36.0)
MCV: 106.9 fL — ABNORMAL HIGH (ref 80.0–100.0)
Platelets: 176 10*3/uL (ref 150–400)
RBC: 3.79 MIL/uL — ABNORMAL LOW (ref 3.87–5.11)
RDW: 13.5 % (ref 11.5–15.5)
WBC: 27.8 10*3/uL — ABNORMAL HIGH (ref 4.0–10.5)
nRBC: 0.1 % (ref 0.0–0.2)

## 2023-02-09 LAB — BASIC METABOLIC PANEL
Anion gap: 12 (ref 5–15)
BUN: 38 mg/dL — ABNORMAL HIGH (ref 8–23)
CO2: 37 mmol/L — ABNORMAL HIGH (ref 22–32)
Calcium: 9.3 mg/dL (ref 8.9–10.3)
Chloride: 93 mmol/L — ABNORMAL LOW (ref 98–111)
Creatinine, Ser: 1.07 mg/dL — ABNORMAL HIGH (ref 0.44–1.00)
GFR, Estimated: 53 mL/min — ABNORMAL LOW (ref >60)
Glucose, Bld: 110 mg/dL — ABNORMAL HIGH (ref 70–99)
Potassium: 3.4 mmol/L — ABNORMAL LOW (ref 3.5–5.1)
Sodium: 142 mmol/L (ref 135–145)

## 2023-02-09 LAB — LEGIONELLA PNEUMOPHILA SEROGP 1 UR AG: L. pneumophila Serogp 1 Ur Ag: NEGATIVE

## 2023-02-09 MED ORDER — POTASSIUM CHLORIDE CRYS ER 20 MEQ PO TBCR
40.0000 meq | EXTENDED_RELEASE_TABLET | Freq: Once | ORAL | Status: AC
  Administered 2023-02-09: 40 meq via ORAL
  Filled 2023-02-09: qty 2

## 2023-02-09 MED ORDER — FUROSEMIDE 20 MG PO TABS
20.0000 mg | ORAL_TABLET | Freq: Every day | ORAL | Status: DC
  Administered 2023-02-09 – 2023-02-11 (×3): 20 mg via ORAL
  Filled 2023-02-09 (×3): qty 1

## 2023-02-09 MED ORDER — FUROSEMIDE 40 MG PO TABS: 40.0000 mg | ORAL_TABLET | Freq: Every day | ORAL | Status: DC

## 2023-02-09 NOTE — Progress Notes (Signed)
Rounding Note    Patient Name: Denise Underwood Date of Encounter: 02/09/2023  [facility] Cardiologist: Little Ishikawa, MD   Subjective   BP 133/56.  Net -1.0 L yesterday, renal function improving (1.72 > 1.54 > 1.20>1.07>1.07).  Potassium 3.4 this morning.  Denies any chest pain or dyspnea.  Inpatient Medications    Scheduled Meds:  apixaban  5 mg Oral BID   arformoterol  15 mcg Nebulization BID   budesonide (PULMICORT) nebulizer solution  0.5 mg Nebulization BID   Chlorhexidine Gluconate Cloth  6 each Topical Daily   diltiazem  60 mg Oral Q6H   hydrocortisone sod succinate (SOLU-CORTEF) inj  50 mg Intravenous Daily   metoprolol tartrate  25 mg Oral BID   nicotine  21 mg Transdermal Daily   nystatin   Topical BID   mouth rinse  15 mL Mouth Rinse 4 times per day   pantoprazole  40 mg Oral Daily   revefenacin  175 mcg Nebulization Daily   traMADol  50 mg Oral Daily   traZODone  50 mg Oral QHS   Continuous Infusions:  sodium chloride Stopped (02/04/23 0648)   PRN Meds: docusate sodium, levalbuterol, LORazepam, mouth rinse, polyethylene glycol, polyvinyl alcohol, traMADol   Vital Signs    Vitals:   02/09/23 0553 02/09/23 0657 02/09/23 0830 02/09/23 0938  BP: 119/61 (!) 122/53  (!) 133/56  Pulse: (!) 51 (!) 54  (!) 56  Resp: 20 (!) 26  (!) 22  Temp: 97.7 F (36.5 C) (!) 97.5 F (36.4 C)  98.1 F (36.7 C)  TempSrc:  Oral  Oral  SpO2: 95% 96% 97% 93%  Weight:      Height:        Intake/Output Summary (Last 24 hours) at 02/09/2023 0951 Last data filed at 02/09/2023 0151 Gross per 24 hour  Intake 171.6 ml  Output 1200 ml  Net -1028.4 ml      02/09/2023    5:00 AM 02/08/2023    5:00 AM 02/07/2023    5:00 AM  Last 3 Weights  Weight (lbs) 160 lb 15 oz 156 lb 8.4 oz 167 lb 1.7 oz  Weight (kg) 73 kg 71 kg 75.8 kg      Telemetry    Intermittently sinus rhythm, otherwise A-fib in 90s- Personally Reviewed  ECG    No new ECG - Personally  Reviewed  Physical Exam   GEN: In no acute distress Neck: No JVD Cardiac: Regular rhythm, normal rate no murmurs, rubs, or gallops.  Respiratory: Basilar crackles GI: Soft, nontender, non-distended  MS: Trace edema Neuro: Alert and oriented x 3 Psych: Normal affect  Labs    High Sensitivity Troponin:   Recent Labs  Lab 02/02/23 1949 02/02/23 2155 02/03/23 0210 02/03/23 1029 02/03/23 1251  TROPONINIHS 84* 109* 117* 106* 102*     Chemistry Recent Labs  Lab 02/04/23 0459 02/05/23 0510 02/06/23 0509 02/07/23 0554 02/08/23 0654 02/09/23 0337  NA 141   < > 139 141 141 142  K 4.7   < > 3.7 3.0* 2.8* 3.4*  CL 101   < > 97* 91* 85* 93*  CO2 29   < > 32 40* 41* 37*  GLUCOSE 135*   < > 122* 153* 129* 110*  BUN 50*   < > 53* 46* 42* 38*  CREATININE 1.85*   < > 1.54* 1.20* 1.07* 1.07*  CALCIUM 8.8*   < > 9.6 9.4 9.6 9.3  MG 2.1  --   --  1.6* 2.0  --   PROT 6.4*   < > 6.5 6.2* 6.2*  --   ALBUMIN 2.5*   < > 2.5* 2.4* 2.7*  --   AST 403*   < > 108* 52* 38  --   ALT 335*   < > 198* 139* 101*  --   ALKPHOS 122   < > 122 105 97  --   BILITOT 0.8   < > 0.9 0.8 1.3*  --   GFRNONAA 28*   < > 35* 47* 53* 53*  ANIONGAP 11   < > 10 10 15 12    < > = values in this interval not displayed.    Lipids No results for input(s): "CHOL", "TRIG", "HDL", "LABVLDL", "LDLCALC", "CHOLHDL" in the last 168 hours.  Hematology Recent Labs  Lab 02/07/23 0605 02/08/23 0654 02/09/23 0337  WBC 31.8* 33.3* 27.8*  RBC 3.69* 3.86* 3.79*  HGB 12.3 13.0 12.8  HCT 38.4 40.4 40.5  MCV 104.1* 104.7* 106.9*  MCH 33.3 33.7 33.8  MCHC 32.0 32.2 31.6  RDW 13.2 13.4 13.5  PLT 185 191 176   Thyroid  Recent Labs  Lab 02/06/23 0813  TSH 0.256*    BNP Recent Labs  Lab 02/02/23 1947  BNP 697.0*    DDimer No results for input(s): "DDIMER" in the last 168 hours.   Radiology    No results found.  Cardiac Studies     Patient Profile     77 y.o. female with a hx of HTN, HLD, prediabetes,  arthritis, COPD, tobacco use who is being seen 02/05/2023 for the evaluation of atrial fibrillation   Assessment & Plan    Paroxysmal atrial fibrillation: Presented to the ED on 01/2021, found to be in new onset A-fib.  Admitted with pneumonia and septic shock.  Echocardiogram showed normal biventricular function, no significant valvular disease.  CHA2DS2-VASc 5 (CHF, hypertension, age x 2, female) -Continue Eliquis 5 mg twice daily -Continue p.o. diltiazem 60 mg every 6 hours.  Will plan to consolidate to long-acting diltiazem prior to discharge.  Can continue metoprolol 25 mg twice daily as well.   -Suspect A-fib is driven by pneumonia and will improve as recovers from underlying illness.  She has been intermittently in sinus rhythm, now appears to be maintaining sinus rhythm  Troponin elevation: Troponin 84 > 109 > 117 > 106 > 102.  She denies any chest pain.  Echo with normal systolic function.  Suspect demand ischemia in setting of sepsis and A-fib with RVR.  No plans for ischemic evaluation at this time  Acute on chronic diastolic heart failure: BNP 677 on 9/22.  Chest x-ray with interstitial edema versus atypical infection.  Net positive this admission due to IV fluids she received for sepsis.  Had CBC in place, CVP 11 on 9/25 -Has been receiving IV Lasix 40 mg daily.  She appears euvolemic, transition to PO lasix today  Hypokalemia: Potassium 3.4 this morning.  Repletion ordered  For questions or updates, please contact [facility] Please consult [email or web address] for contact info under        Signed, Little Ishikawa, MD  02/09/2023, 9:51 AM

## 2023-02-09 NOTE — Progress Notes (Signed)
NAME:  Denise Underwood, MRN:  [id number], DOB:  09-Nov-1945, LOS: 7 ADMISSION DATE:  02/02/2023, CONSULTATION DATE:  02/02/2023 REFERRING MD:  Eber Hong, MD EDP, CHIEF COMPLAINT:  acute respiratory failure    History of Present Illness:  77 year old female with past medical history of hypertension, hyperlipidemia, pre-diabetes, arthritis, leukocytosis, COPD, tobacco use who presented to [facility] emergency department on 02/02/23 for shortness of breath. Noted to have 1-2 days of worsening shortness of breath and found to have room air sat of 76% by EMS. Noted to be dyspneic and unable to even stand at home. In the ED, tachypneic, new atrial fibrillation with RVR,. She was placed on oxygen. Cardizem drip was started and the patient converted to normal sinus rhythm. Subsequently, she continued to be hypotensive and 2L IVF were ordered and eventually required vasopressor support. EDP placed central line. Additionally, her laboratory work up showed new WBC 34.5, Cl 93, BUN/Cr 46/ 2.21, AST 768, ALT 445, troponin 84>109>117, BNP 697, lactic wnl. UA without UTI. BCX sent. CXR showing edema vs atypical infection, CT AP showing RLL infilatrate, small right pleural effusion, no acute intraabdominal findings. ABG ordered showed pH 7.15, pCO2 96, pO2 161. Placed on BiPAP. Repeat 7.19/77/103. She was started on Rocephin, Zithromax.  PCCM was consulted for admission.   Pertinent  Medical History  hypertension, hyperlipidemia, pre-diabetes, arthritis, leukocytosis, COPD, tobacco use  Significant Hospital Events: Including procedures, antibiotic start and stop dates in addition to other pertinent events   9/23: admitted to [facility] 9/24: no acute events overnight. She was anxious per overnight notes and was eventually started on precedex gtt to help with BiPAP.  9/26 minimally responsive early AM. Reportedly had worn BiPAP overnight, but was also agitated and received olanzapine injection.  9/28 decreased  responsiveness, refused BiPAP last night  Interim History / Subjective:  Continues to refuse BiPAP More awake and interactive this morning Still requiring oxygen supplementation Denies any pain or discomfort  Objective   Blood pressure (!) 133/56, pulse (!) 56, temperature 98.1 F (36.7 C), temperature source Oral, resp. rate (!) 22, height 5\' 4"  (1.626 m), weight 73 kg, SpO2 93%.        Intake/Output Summary (Last 24 hours) at 02/09/2023 1152 Last data filed at 02/09/2023 1000 Gross per 24 hour  Intake 220 ml  Output 1200 ml  Net -980 ml   Filed Weights   02/07/23 0500 02/08/23 0500 02/09/23 0500  Weight: 75.8 kg 71 kg 73 kg    Examination: General: Elderly, does not appear to be in distress HENT: Pupils equal and reactive, anicteric Lungs: Clear breath sounds bilaterally Cardiovascular: S1-S2 appreciated Abdomen: Soft, nontender Extremities: No edema Neuro: Awake, alert  I reviewed nursing notes, hospitalist notes, last 24 h vitals and pain scores, last 48 h intake and output, last 24 h labs and trends, and last 24 h imaging results.  Resolved Hospital Problem list   Shock  Assessment & Plan:   Community-acquired pneumonia Sepsis Viral panel testing, MRSA PCR, Legionella and streptococcal antigens negative -Completed course of antibiotics  Metabolic encephalopathy Noncompliant with BiPAP Although may benefit from using NIPPV at home however, patient is not compliant with its use in the hospital it is unlikely that she will use it at home -Continue to limit sedating medications -VBG pCO2 of 62  Acute hypoxic/hypercarbic respiratory failure secondary to pneumonia COPD exacerbation -Continue bronchodilators -Continue steroids and wean off-I believe steroids can be stopped  New onset atrial fibrillation -Appreciate cardiology following -Optimizing  calcium channel and beta blockers  Acute kidney injury -This appears to be stabilizing -Avoid nephrotoxic  medications Maintain renal perfusion  Tobacco abuse -Does have a nicotine patch in place  Leukocytosis appears to be improving  Best Practice (right click and "Reselect all SmartList Selections" daily)   Per [facility] Code Status:  full code Last date of multidisciplinary goals of care discussion [discussed plan of care with patient at bedside on arrival]  Labs   CBC: Recent Labs  Lab 02/02/23 1949 02/03/23 1829 02/04/23 0459 02/07/23 0605 02/08/23 0654 02/09/23 0337  WBC 34.5* 39.3* 37.4* 31.8* 33.3* 27.8*  NEUTROABS 30.0* 37.7*  --   --   --   --   HGB 12.1 11.1* 11.1* 12.3 13.0 12.8  HCT 38.5 36.8 35.7* 38.4 40.4 40.5  MCV 110.0* 112.9* 110.9* 104.1* 104.7* 106.9*  PLT 232 212 230 185 191 176    Basic Metabolic Panel: Recent Labs  Lab 02/02/23 1949 02/03/23 1029 02/03/23 1829 02/04/23 0459 02/05/23 0510 02/06/23 0509 02/07/23 0554 02/08/23 0654 02/09/23 0337  NA 136  --    < > 141 135 139 141 141 142  K 4.9  --    < > 4.7 4.3 3.7 3.0* 2.8* 3.4*  CL 93*  --    < > 101 97* 97* 91* 85* 93*  CO2 28  --    < > 29 28 32 40* 41* 37*  GLUCOSE 189*  --    < > 135* 126* 122* 153* 129* 110*  BUN 46*  --    < > 50* 54* 53* 46* 42* 38*  CREATININE 2.21*  --    < > 1.85* 1.72* 1.54* 1.20* 1.07* 1.07*  CALCIUM 9.0  --    < > 8.8* 8.9 9.6 9.4 9.6 9.3  MG 2.1 1.9  --  2.1  --   --  1.6* 2.0  --    < > = values in this interval not displayed.   GFR: Estimated Creatinine Clearance: 43.1 mL/min (A) (by C-G formula based on SCr of 1.07 mg/dL (H)). Recent Labs  Lab 02/02/23 1949 02/02/23 2159 02/03/23 1829 02/04/23 0459 02/07/23 0605 02/08/23 0654 02/09/23 0337  WBC 34.5*  --    < > 37.4* 31.8* 33.3* 27.8*  LATICACIDVEN 1.8 1.3  --   --   --   --   --    < > = values in this interval not displayed.      ABG    Component Value Date/Time   PHART 7.35 02/06/2023 0811   PCO2ART 78 (HH) 02/06/2023 0811   PO2ART 97 02/06/2023 0811   HCO3 54.2 (H) 02/08/2023 0924    ACIDBASEDEF 0.9 02/03/2023 0132   O2SAT 92.7 02/08/2023 0924    Virl Diamond, MD [facility] Pager: See Loretha Stapler

## 2023-02-09 NOTE — Progress Notes (Signed)
[facility]                                                                              Denise Underwood, is a 77 y.o. female, DOB - 01/12/1946, NWG:[id number] Admit date - 02/02/2023    Outpatient Primary MD for the patient is [name], [name], FNP  LOS - 7  days  Chief Complaint  Patient presents with   Shortness of Breath   Altered Mental Status       Brief summary   Patient is a 77 year old female with HTN, HLP, prediabetes, arthritis, COPD, tobacco use presented to [name], ED on 02/02/2023 with shortness of breath.  Patient had noted 1 to 2 days of worsening shortness of breath, was found hypoxic with O2 sat 76% by EMS, dyspneic, tachypneic and unable to even stand at home. In ED, was found to have new atrial fibrillation with RVR, was placed on O2, Cardizem drip was started, patient converted to NSR. Subsequently noted to be hypotensive, received IV fluids and eventually required vasopressor support.  Patient was transferred to [facility] by PCCM to ICU on 02/03/2023  PCCM requested TRH to assume care on 02/05/2023.   Assessment & Plan      Septic shock (HCC), community-acquired pneumonia -CT abdomen pelvis showed right lower lung infiltrates, UA negative for UTI, no intra-abdominal pathology. -Chest x-ray 02/04/2023 with worsening RLL infiltrates -Off vasopressors, blood cultures 9/22 negative so far -Flu, RSV, COVID-negative, respiratory virus panel negative, urine strep antigen negative -Completed IV ceftriaxone and Zithromax x 5 days -Sepsis physiology improving     Acute respiratory failure with hypoxia and hypercapnia (HCC) Community-acquired pneumonia, underlying COPD, tobacco use, RLL pleural effusion -Not on O2 at baseline -continues to refuse Overnight  BiPAP, was placed on 6 L O2 via HFNC -wean O2 as tolerated -Pulmonology following, continue Pulmicort, Brovana, Yupelri, Xopenex, flutter valve -Solu-Cortef 50 mg tapered today to daily  - may need  BiPAP, nocturnal for home IF she will wear     New onset atrial fibrillation with RVR (HCC), elevated troponins Volume overload with acute diastolic CHF -Elevated troponins likely due to demand ischemia from respiratory failure, septic shock -2D echo showed EF 60 to 65%, G1 DD, normal right ventricular systolic function -HR controlled, continue p.o. diltiazem 60 mg every 6 hours, metoprolol 12.5 mg twice daily -CHA2DS2-VASc 5, continue apixaban -cards consulted  Acute metabolic encephalopathy -Avoid sedating meds   Elevated acute transaminitis, shock liver -Likely due to septic shock, AST 768, ALT 445 on admission -trend     AKI (acute kidney injury) (HCC) -Creatinine 2.21 on admission, was 1.1 on 12/25/2022 -Creatinine improving  Hypokalemia, hypomagnesemia -Replace  Pressure injury documentation -Lower coccyx medial stage I, POA Pressure Injury 02/03/23 Coccyx Medial;Lower Stage 1 -  Intact skin with non-blanchable redness of a localized area usually over a bony prominence. nonblanchable redness to sacrum (Active)  02/03/23 1000  Location: Coccyx  Location Orientation: Medial;Lower  Staging: Stage 1 -  Intact skin with non-blanchable redness of a localized area usually over a bony prominence.  Wound Description (Comments): nonblanchable redness to sacrum  Present on Admission: Yes  Hyperlipidemia -Continue to hold statins due to acute transaminitis  Hypertension -Continue Cardizem, beta-blocker, BP stable  Obesity Estimated body mass index is 27.62 kg/m as calculated from the following:   Height as of this encounter: 5\' 4"  (1.626 m).   Weight as of this encounter: 73 kg.   PT recommending SNF but family wants to take home with caregivers   Code Status: Full CODE STATUS DVT Prophylaxis:   apixaban (ELIQUIS) tablet 5 mg    Family Communication:   No family at the bedside Disposition Plan:      Remains inpatient appropriate:      Consultants:    Patient was admitted by [facility]   Medications  apixaban  5 mg Oral BID   arformoterol  15 mcg Nebulization BID   budesonide (PULMICORT) nebulizer solution  0.5 mg Nebulization BID   Chlorhexidine Gluconate Cloth  6 each Topical Daily   diltiazem  60 mg Oral Q6H   hydrocortisone sod succinate (SOLU-CORTEF) inj  50 mg Intravenous Daily   metoprolol tartrate  25 mg Oral BID   nicotine  21 mg Transdermal Daily   nystatin   Topical BID   mouth rinse  15 mL Mouth Rinse 4 times per day   pantoprazole  40 mg Oral Daily   revefenacin  175 mcg Nebulization Daily   traMADol  50 mg Oral Daily   traZODone  50 mg Oral QHS      Subjective:   Some SOB this AM with wheeze  Objective:   Vitals:   02/09/23 0553 02/09/23 0657 02/09/23 0830 02/09/23 0938  BP: 119/61 (!) 122/53  (!) 133/56  Pulse: (!) 51 (!) 54  (!) 56  Resp: 20 (!) 26  (!) 22  Temp: 97.7 F (36.5 C) (!) 97.5 F (36.4 C)  98.1 F (36.7 C)  TempSrc:  Oral  Oral  SpO2: 95% 96% 97% 93%  Weight:      Height:        Intake/Output Summary (Last 24 hours) at 02/09/2023 0957 Last data filed at 02/09/2023 0151 Gross per 24 hour  Intake 171.6 ml  Output 1200 ml  Net -1028.4 ml     Wt Readings from Last 3 Encounters:  02/09/23 73 kg  01/10/23 72.2 kg  12/25/22 72.7 kg    Physical Exam  General: Appearance:     Overweight female in no acute distress     Lungs:    On [place] diminished, expiratory wheezing respirations unlabored  Heart:    Bradycardic. irregular  MS:   All extremities are intact.   Neurologic:   Awake and alert       Data Reviewed:  I have personally reviewed following labs    CBC Lab Results  Component Value Date   WBC 27.8 (H) 02/09/2023   RBC 3.79 (L) 02/09/2023   HGB 12.8 02/09/2023   HCT 40.5 02/09/2023   MCV 106.9 (H) 02/09/2023   MCH 33.8 02/09/2023   PLT 176 02/09/2023   MCHC 31.6 02/09/2023   RDW 13.5 02/09/2023   LYMPHSABS 0.4 (L) 02/03/2023   MONOABS 1.2 (H)  02/03/2023   EOSABS 0.0 02/03/2023   BASOSABS 0.0 02/03/2023     Last metabolic panel Lab Results  Component Value Date   NA 142 02/09/2023   K 3.4 (L) 02/09/2023   CL 93 (L) 02/09/2023   CO2 37 (H) 02/09/2023   BUN 38 (H) 02/09/2023   CREATININE 1.07 (H) 02/09/2023   GLUCOSE 110 (H) 02/09/2023  GFRNONAA 53 (L) 02/09/2023   GFRAA 74 06/21/2020   CALCIUM 9.3 02/09/2023   PROT 6.2 (L) 02/08/2023   ALBUMIN 2.7 (L) 02/08/2023   LABGLOB 2.3 12/25/2022   AGRATIO 1.5 05/30/2022   BILITOT 1.3 (H) 02/08/2023   ALKPHOS 97 02/08/2023   AST 38 02/08/2023   ALT 101 (H) 02/08/2023   ANIONGAP 12 02/09/2023    CBG (last 3)  Recent Labs    02/09/23 0002 02/09/23 0600 02/09/23 0708  GLUCAP 119* 127* 138*      Coagulation Profile: Recent Labs  Lab 02/02/23 1949  INR 1.2     Radiology Studies: I have personally reviewed the imaging studies  No results found.     Joseph Art DO [facility] 02/09/2023, 9:57 AM  Available via Epic secure chat 7am-7pm After 7 pm, please refer to night coverage provider listed on amion.

## 2023-02-09 NOTE — Progress Notes (Signed)
   02/09/23 2310  BiPAP/CPAP/SIPAP  Reason BIPAP/CPAP not in use Non-compliant

## 2023-02-10 ENCOUNTER — Telehealth: Payer: Self-pay | Admitting: Pulmonary Disease

## 2023-02-10 DIAGNOSIS — J189 Pneumonia, unspecified organism: Secondary | ICD-10-CM | POA: Diagnosis not present

## 2023-02-10 DIAGNOSIS — F17210 Nicotine dependence, cigarettes, uncomplicated: Secondary | ICD-10-CM | POA: Diagnosis not present

## 2023-02-10 DIAGNOSIS — I4891 Unspecified atrial fibrillation: Secondary | ICD-10-CM | POA: Diagnosis not present

## 2023-02-10 DIAGNOSIS — R6521 Severe sepsis with septic shock: Secondary | ICD-10-CM | POA: Diagnosis not present

## 2023-02-10 DIAGNOSIS — N179 Acute kidney failure, unspecified: Secondary | ICD-10-CM | POA: Diagnosis not present

## 2023-02-10 DIAGNOSIS — A419 Sepsis, unspecified organism: Secondary | ICD-10-CM | POA: Diagnosis not present

## 2023-02-10 DIAGNOSIS — Z515 Encounter for palliative care: Secondary | ICD-10-CM | POA: Diagnosis not present

## 2023-02-10 DIAGNOSIS — J441 Chronic obstructive pulmonary disease with (acute) exacerbation: Secondary | ICD-10-CM | POA: Diagnosis not present

## 2023-02-10 DIAGNOSIS — J9601 Acute respiratory failure with hypoxia: Secondary | ICD-10-CM | POA: Diagnosis not present

## 2023-02-10 DIAGNOSIS — J9602 Acute respiratory failure with hypercapnia: Secondary | ICD-10-CM | POA: Diagnosis not present

## 2023-02-10 LAB — BASIC METABOLIC PANEL
Anion gap: 11 (ref 5–15)
BUN: 39 mg/dL — ABNORMAL HIGH (ref 8–23)
CO2: 35 mmol/L — ABNORMAL HIGH (ref 22–32)
Calcium: 9.1 mg/dL (ref 8.9–10.3)
Chloride: 94 mmol/L — ABNORMAL LOW (ref 98–111)
Creatinine, Ser: 1.13 mg/dL — ABNORMAL HIGH (ref 0.44–1.00)
GFR, Estimated: 50 mL/min — ABNORMAL LOW (ref >60)
Glucose, Bld: 106 mg/dL — ABNORMAL HIGH (ref 70–99)
Potassium: 3.1 mmol/L — ABNORMAL LOW (ref 3.5–5.1)
Sodium: 140 mmol/L (ref 135–145)

## 2023-02-10 LAB — VITAMIN B1: Vitamin B1 (Thiamine): 205.5 nmol/L — ABNORMAL HIGH (ref 66.5–200.0)

## 2023-02-10 LAB — GLUCOSE, CAPILLARY
Glucose-Capillary: 107 mg/dL — ABNORMAL HIGH (ref 70–99)
Glucose-Capillary: 123 mg/dL — ABNORMAL HIGH (ref 70–99)
Glucose-Capillary: 154 mg/dL — ABNORMAL HIGH (ref 70–99)
Glucose-Capillary: 270 mg/dL — ABNORMAL HIGH (ref 70–99)

## 2023-02-10 LAB — MAGNESIUM: Magnesium: 1.5 mg/dL — ABNORMAL LOW (ref 1.7–2.4)

## 2023-02-10 MED ORDER — MAGNESIUM SULFATE 50 % IJ SOLN
4.0000 g | Freq: Once | INTRAVENOUS | Status: AC
  Administered 2023-02-10: 4 g via INTRAVENOUS
  Filled 2023-02-10: qty 8

## 2023-02-10 MED ORDER — DILTIAZEM HCL ER COATED BEADS 240 MG PO CP24
240.0000 mg | ORAL_CAPSULE | Freq: Every day | ORAL | Status: DC
  Administered 2023-02-10: 240 mg via ORAL
  Filled 2023-02-10: qty 1

## 2023-02-10 MED ORDER — POTASSIUM CHLORIDE CRYS ER 20 MEQ PO TBCR
40.0000 meq | EXTENDED_RELEASE_TABLET | Freq: Two times a day (BID) | ORAL | Status: AC
  Administered 2023-02-10 (×2): 40 meq via ORAL
  Filled 2023-02-10 (×2): qty 2

## 2023-02-10 MED ORDER — DILTIAZEM HCL ER COATED BEADS 240 MG PO CP24: 240.0000 mg | ORAL_CAPSULE | Freq: Every day | ORAL | Status: DC

## 2023-02-10 NOTE — Progress Notes (Signed)
Pt refuses CPAP 

## 2023-02-10 NOTE — Progress Notes (Addendum)
[facility]                                                                              Denise Underwood, is a 77 y.o. female, DOB - 11-03-1945, WUX:[id number] Admit date - 02/02/2023    Outpatient Primary MD for the patient is [name], [name], FNP  LOS - 8  days  Chief Complaint  Patient presents with   Shortness of Breath   Altered Mental Status       Brief summary   Patient is a 77 year old female with HTN, HLP, prediabetes, arthritis, COPD, tobacco use presented to [name], ED on 02/02/2023 with shortness of breath.  Patient had noted 1 to 2 days of worsening shortness of breath, was found hypoxic with O2 sat 76% by EMS, dyspneic, tachypneic and unable to even stand at home. In ED, was found to have new atrial fibrillation with RVR, was placed on O2, Cardizem drip was started, patient converted to NSR. Subsequently noted to be hypotensive, received IV fluids and eventually required vasopressor support.  Patient was transferred and by PCCM to ICU on 02/03/2023  CTAP showed right lower lung infiltrates, small right pleural effusion.  ABG showed pH 7.15, pCO2 96, pO2 161, was placed on BiPAP and IV antibiotics. WBCs 34.5, creatinine 2.21, AST 760, ALT 445, troponin 84> 117.  BNP 697.  PCCM requested TRH to assume care on 02/05/2023.  Assessment & Plan    Principal Problem:   Septic shock (HCC), community-acquired pneumonia -CT abdomen pelvis showed right lower lung infiltrates, UA negative for UTI, no intra-abdominal pathology. -Chest x-ray 02/04/2023 with worsening RLL infiltrates -Off vasopressors, blood cultures 9/22 negative so far -Flu, RSV, COVID-negative, respiratory virus panel negative, urine strept antigen negative, Legionella antigen negative -Completed IV ceftriaxone and Zithromax x 5 days -Sepsis physiology improving   Active Problems:   Acute respiratory failure with hypoxia and hypercapnia (HCC) Community-acquired pneumonia, underlying COPD,  tobacco use, RLL pleural effusion -Not on O2 at baseline -Pulmonology following, continue Pulmicort, Brovana, Yupelri, Xopenex, flutter valve -Currently on 2 L O2 via nasal cannula, wean as tolerated, will need home O2 evaluation -Taper off Solu-Cortef today.  - may need nocturnal BiPAP for home however she is noncompliant with BiPAP here inpatient, refusing.  So unlikely she will use it at home.    New onset atrial fibrillation with RVR (HCC), elevated troponins Volume overload with acute diastolic CHF -Elevated troponins likely due to demand ischemia from respiratory failure, septic shock -2D echo showed EF 60 to 65%, G1 DD, normal right ventricular systolic function -Patient was placed on IV Lasix, transitioned to oral Lasix 20 mg daily  -CHA2DS2-VASc 5, continue apixaban -Continue diltiazem, metoprolol 25 mg twice daily cardiology following, will consolidate short acting diltiazem.  Acute metabolic encephalopathy -Likely due to hypercarbia ABG showed pH 7.3, pCO2 78, pO2 97 -Ammonia level normal 25, B12 normal, folate normal, TSH 0.2 -Resolved, alert and oriented x 3, mental status close to baseline, confirmed by daughters at the bedside  Elevated acute transaminitis, shock liver -Likely due to septic shock, AST 768, ALT 445 on admission -LFTs improving     AKI (  acute kidney injury) (HCC) -Creatinine 2.21 on admission, was 1.1 on 12/25/2022 -Improved, at baseline 1.1  Hypokalemia, hypomagnesemia -Potassium 3.1, replaced -Magnesium 1.5, replaced IV   Pressure injury documentation -Lower coccyx medial stage I, POA -Wound care per nursing  Hyperlipidemia -Continue to hold statins due to acute transaminitis  Hypertension -Continue Cardizem, beta-blocker, BP stable  Obesity Estimated body mass index is 27.06 kg/m as calculated from the following:   Height as of this encounter: 5\' 4"  (1.626 m).   Weight as of this encounter: 71.5 kg.  Code Status: Full CODE STATUS DVT  Prophylaxis:   apixaban (ELIQUIS) tablet 5 mg   Level of Care: Level of care: Progressive Family Communication: Updated patient's 2 daughters at the bedside Disposition Plan:      Remains inpatient appropriate:   Discussed disposition with the patient and 2 daughters.  Patient adamantly declined SNF, wants to go home.  Her daughter will be with her, has a home health aide who comes daily.  Will arrange home health PT OT, RN. Home O2 evaluation.  Likely DC home tomorrow    Procedures:  BiPAP  Consultants:   Patient was admitted by [facility] Card[facility]  Antimicrobials:   Anti-infectives (From admission, onward)    Start     Dose/Rate Route Frequency Ordered Stop   02/03/23 1130  cefTRIAXone (ROCEPHIN) 2 g in sodium chloride 0.9 % 100 mL IVPB  Status:  Discontinued        2 g 200 mL/hr over 30 Minutes Intravenous Every 24 hours 02/03/23 1032 02/07/23 1002   02/02/23 2045  azithromycin (ZITHROMAX) 500 mg in sodium chloride 0.9 % 250 mL IVPB  Status:  Discontinued        500 mg 250 mL/hr over 60 Minutes Intravenous Every 24 hours 02/02/23 2032 02/07/23 1002   02/02/23 2030  cefTRIAXone (ROCEPHIN) 1 g in sodium chloride 0.9 % 100 mL IVPB        1 g 200 mL/hr over 30 Minutes Intravenous  Once 02/02/23 2016 02/02/23 2112          Medications  apixaban  5 mg Oral BID   arformoterol  15 mcg Nebulization BID   budesonide (PULMICORT) nebulizer solution  0.5 mg Nebulization BID   Chlorhexidine Gluconate Cloth  6 each Topical Daily   diltiazem  60 mg Oral Q6H   furosemide  20 mg Oral Daily   hydrocortisone sod succinate (SOLU-CORTEF) inj  50 mg Intravenous Daily   metoprolol tartrate  25 mg Oral BID   nicotine  21 mg Transdermal Daily   nystatin   Topical BID   mouth rinse  15 mL Mouth Rinse 4 times per day   pantoprazole  40 mg Oral Daily   revefenacin  175 mcg Nebulization Daily   traMADol  50 mg Oral Daily   traZODone  50 mg Oral QHS      Subjective:   Denise Underwood was seen  and examined today.  Much more alert and oriented today, 2 daughters at the bedside.  Still feels very generalized weakness however does not want to go to SNF.  Heart rate controlled.  No chest pain or acute shortness of breath, fevers or chills.  Still on 2 L O2 via [place].    Objective:   Vitals:   02/10/23 0614 02/10/23 0703 02/10/23 0828 02/10/23 1120  BP: (!) 141/70     Pulse: 67     Resp: 15     Temp: (!) 97.5 F (36.4 C)  TempSrc: Oral     SpO2: 92%  93% 90%  Weight:  71.5 kg    Height:        Intake/Output Summary (Last 24 hours) at 02/10/2023 1203 Last data filed at 02/10/2023 1000 Gross per 24 hour  Intake 360 ml  Output 1801 ml  Net -1441 ml     Wt Readings from Last 3 Encounters:  02/10/23 71.5 kg  01/10/23 72.2 kg  12/25/22 72.7 kg   Physical Exam General: Alert and oriented x 3, NAD Cardiovascular: S1 S2 clear, RRR.  Respiratory: Diminished breath sound at the bases, no wheezing Gastrointestinal: Soft, nontender, nondistended, NBS Ext: trace pedal edema bilaterally Neuro: no new deficits Psych: Normal affect       Data Reviewed:  I have personally reviewed following labs    CBC Lab Results  Component Value Date   WBC 27.8 (H) 02/09/2023   RBC 3.79 (L) 02/09/2023   HGB 12.8 02/09/2023   HCT 40.5 02/09/2023   MCV 106.9 (H) 02/09/2023   MCH 33.8 02/09/2023   PLT 176 02/09/2023   MCHC 31.6 02/09/2023   RDW 13.5 02/09/2023   LYMPHSABS 0.4 (L) 02/03/2023   MONOABS 1.2 (H) 02/03/2023   EOSABS 0.0 02/03/2023   BASOSABS 0.0 02/03/2023     Last metabolic panel Lab Results  Component Value Date   NA 140 02/10/2023   K 3.1 (L) 02/10/2023   CL 94 (L) 02/10/2023   CO2 35 (H) 02/10/2023   BUN 39 (H) 02/10/2023   CREATININE 1.13 (H) 02/10/2023   GLUCOSE 106 (H) 02/10/2023   GFRNONAA 50 (L) 02/10/2023   GFRAA 74 06/21/2020   CALCIUM 9.1 02/10/2023   PROT 6.2 (L) 02/08/2023   ALBUMIN 2.7 (L) 02/08/2023   LABGLOB 2.3 12/25/2022   AGRATIO 1.5  05/30/2022   BILITOT 1.3 (H) 02/08/2023   ALKPHOS 97 02/08/2023   AST 38 02/08/2023   ALT 101 (H) 02/08/2023   ANIONGAP 11 02/10/2023    CBG (last 3)  Recent Labs    02/10/23 0002 02/10/23 0618 02/10/23 1156  GLUCAP 107* 123* 270*      Coagulation Profile: No results for input(s): "INR", "PROTIME" in the last 168 hours.    Radiology Studies: I have personally reviewed the imaging studies  No results found.     Thad Ranger M.D. [facility] 02/10/2023, 12:03 PM  Available via Epic secure chat 7am-7pm After 7 pm, please refer to night coverage provider listed on amion.

## 2023-02-10 NOTE — Progress Notes (Signed)
Occupational Therapy Treatment Patient Details Name: Denise Underwood MRN: [id number] DOB: 15-Jun-1945 Today's Date: 02/10/2023   History of present illness Denise Underwood is a 77 y.o. female presented to [name], ED on 02/02/2023 with shortness of breath, hypoxic, dyspneic, tachypneic and unable to even stand at home.  In ED, was found to have new atrial fibrillation with RVR, patient converted to NSR, noted to be hypotensive,  transferred  to ICU on 02/03/2023.  Pt's past medical history relevant for COPD, tobacco abuse, chronic leukocytosis, psoriasis and recurrent candidal intertrigo of the groin/inguinal area and inframammary areas as well as recurrent falls at home.   OT comments  Patient progressing and showed improved bed mobility with supervision-CGA compared to previous session when pt required Min As of 2 people, as well as Min As of 1 rather than 2 to safely transfer to recliner.  Pt demonstrating impaired safety awareness, impaired memory and the need for continuous supplemental O2 at 2L via [place] due to room air desats as low as 85% with EOB sitting.   Patient remains limited by cognitive deficits, generalized weakness and decreased activity tolerance along with deficits noted below. Pt's mid-back pain seems resolved. Pt continues to demonstrate good rehab potential and would benefit from continued skilled OT to increase safety and independence with ADLs and functional transfers to allow pt to return home safely and reduce caregiver burden and fall risk.       If plan is discharge home, recommend the following:  A little help with walking and/or transfers;A lot of help with bathing/dressing/bathroom;Supervision due to cognitive status;Direct supervision/assist for medications management;Direct supervision/assist for financial management;Assist for transportation;Help with stairs or ramp for entrance;Assistance with cooking/housework   Equipment Recommendations       Recommendations for  Other Services      Precautions / Restrictions Precautions Precautions: Fall Precaution Comments: monitor  VS on 2 L Restrictions Weight Bearing Restrictions: No       Mobility Bed Mobility Overal bed mobility: Needs Assistance Bed Mobility: Supine to Sit     Supine to sit: HOB elevated, Supervision          Transfers                         Balance Overall balance assessment: Needs assistance, History of Falls Sitting-balance support: Single extremity supported Sitting balance-Leahy Scale: Good     Standing balance support: Bilateral upper extremity supported, During functional activity, Reliant on assistive device for balance Standing balance-Leahy Scale: Poor Standing balance comment: See LE dressing, Pt with 2 losses of balance when trying to remove one hand from walker, requiring controlled lower to EOB with Min As..                           ADL either performed or assessed with clinical judgement   ADL Overall ADL's : Needs assistance/impaired Eating/Feeding: Sitting;Set up Eating/Feeding Details (indicate cue type and reason): Increased time/effort to open applesauce, but able. Grooming: Set up;Sitting;Supervision/safety;Cueing for safety Grooming Details (indicate cue type and reason): Pt setup in recliner and provided with warm washcloth with abx gel to clean hands before eating. Pt told "Do not wipe your face with this. It was antibacterial gel on it". Once pt completed hand hygiene she raised washclose to face to wipe and OT stopped her and reminded. Pt was given fresh cloth with warm water for face. Upper Body Bathing: Contact guard assist;Sitting  Upper Body Dressing : Contact guard assist;Set up;Sitting Upper Body Dressing Details (indicate cue type and reason): To don posterior gown Lower Body Dressing: Maximal assistance;Sit to/from stand;Sitting/lateral leans;Cueing for safety;Cueing for sequencing;Total assistance Lower  Body Dressing Details (indicate cue type and reason): Total Assist to don mesh underwear over feet. Once standing, pt cued to try releasing one hand to help pull over hips. Pt with controlled LOB back to EOB when she released hand. Pt stood again and told to keep hands on RW. Pt again released hand in attemtped to help OT don underwear and once again required controlled return to EOB due to LOB.  Pt stood 3rd time and required Total Assist to complete LE dressing. Toilet Transfer: Minimal assistance;Rolling walker (2 wheels);Cueing for sequencing;Cueing for safety;Stand-pivot Toilet Transfer Details (indicate cue type and reason): Simulated to recliner with stand/step and need of Min As cues. Pt not consistently following safety cues.   Toileting - Clothing Manipulation Details (indicate cue type and reason): Pt on pure wick and reports incontinence of urine.     Functional mobility during ADLs: Minimal assistance;Rolling walker (2 wheels);Cueing for sequencing;Cueing for safety      Extremity/Trunk Assessment Upper Extremity Assessment Upper Extremity Assessment: Generalized weakness   Lower Extremity Assessment Lower Extremity Assessment: Generalized weakness        Vision   Vision Assessment?: No apparent visual deficits   Perception     Praxis      Cognition Arousal: Alert Behavior During Therapy: WFL for tasks assessed/performed Overall Cognitive Status: No family/caregiver present to determine baseline cognitive functioning Area of Impairment: Attention, Memory, Following commands, Awareness, Safety/judgement                   Current Attention Level: Selective Memory: Decreased recall of precautions, Decreased short-term memory Following Commands: Follows one step commands consistently, Follows one step commands with increased time, Follows multi-step commands inconsistently Safety/Judgement: Decreased awareness of deficits, Decreased awareness of safety Awareness:  Emergent            Exercises Other Exercises Other Exercises: Worked with pt on O2 saturation test which is documented. Worked on fall prevention education. Pt able to think of one fall prevention technique but no others without cues. Will continue educating.    Shoulder Instructions       General Comments      Pertinent Vitals/ Pain       Pain Assessment Pain Assessment: No/denies pain  Home Living                                          Prior Functioning/Environment              Frequency  Min 1X/week        Progress Toward Goals  OT Goals(current goals can now be found in the care plan section)  Progress towards OT goals: Progressing toward goals  Acute Rehab OT Goals Patient Stated Goal: To go home OT Goal Formulation: With patient Time For Goal Achievement: 02/21/23 Potential to Achieve Goals: Good  Plan      Co-evaluation                 AM-PAC OT "6 Clicks" Daily Activity     Outcome Measure   Help from another person eating meals?: A Little Help from another person taking care of personal grooming?: A Little Help from  another person toileting, which includes using toliet, bedpan, or urinal?: A Lot Help from another person bathing (including washing, rinsing, drying)?: A Lot Help from another person to put on and taking off regular upper body clothing?: A Little Help from another person to put on and taking off regular lower body clothing?: Total 6 Click Score: 14    End of Session Equipment Utilized During Treatment: Gait belt;Rolling walker (2 wheels);Oxygen      Activity Tolerance Patient limited by fatigue   Patient Left in chair;with call bell/phone within reach;with nursing/sitter in room;with chair alarm set   Nurse Communication Other (comment) (OT handoff to NP)        Time: 9562-1308 OT Time Calculation (min): 30 min  Charges: OT General Charges $OT Visit: 1 Visit OT Treatments $Self  Care/Home Management : 8-22 mins $Therapeutic Activity: 8-22 mins  Victorino Dike, OT Acute Rehab Services Office: [phone] 02/10/2023   Theodoro Clock 02/10/2023, 11:29 AM

## 2023-02-10 NOTE — Telephone Encounter (Signed)
Please make appointment for hospital follow-up with Dr. Sherene Sires in 2 to 3 weeks

## 2023-02-10 NOTE — Progress Notes (Signed)
Daily Progress Note   Patient Name: Denise Underwood       Date: 02/10/2023 DOB: August 05, 1945  Age: 77 y.o. MRN#: [id number] Attending Physician: Cathren Harsh, MD Primary Care Physician: Sonny Masters, FNP Admit Date: 02/02/2023  Reason for Consultation/Follow-up: Establishing goals of care  Patient Profile/HPI:  77 y.o. female  with past medical history of HTN, COPD, HLD, DM, COPD admitted on 02/02/2023 with sepsis related to pneumonia and COPD exacerbation.   Subjective: Denise Underwood is awake and alert. Sitting up in bed, noted OT eval with impaired memory and desaturation to 85% with sitting on EOB.  We discussed her need for bipap at night- she said she is willing to wear it. We also discussed her smoking at home- she notes she had stopped smoking for 9 years previously, and she is motivated to try again. Her goals of care continue to be to stabilize and discharge home, she does not want to go to a nursing facility.  I discussed the importance of her and her daughter discussing advanced care planning and medical decision making as her illness is likely to progress over time.  I spoke with patient's daughter Denise Underwood. She is in agreement with plan for discharge home. I offered outpatient Palliative support and she is agreeable.   Review of Systems  Constitutional:  Positive for malaise/fatigue.  Respiratory:  Positive for shortness of breath.      Physical Exam Vitals and nursing note reviewed.  Cardiovascular:     Rate and Rhythm: Normal rate.  Pulmonary:     Effort: Pulmonary effort is normal.     Comments: At rest Neurological:     Mental Status: She is alert and oriented to person, place, and time.             Vital Signs: BP (!) 141/70 (BP Location: Right Arm)   Pulse 67   Temp  (!) 97.5 F (36.4 C) (Oral)   Resp 15   Ht 5\' 4"  (1.626 m)   Wt 71.5 kg   SpO2 90%   BMI 27.06 kg/m  SpO2: SpO2: 90 % O2 Device: O2 Device: Nasal Cannula O2 Flow Rate: O2 Flow Rate (L/min): 2 L/min  Intake/output summary:  Intake/Output Summary (Last 24 hours) at 02/10/2023 1155 Last data filed at 02/10/2023 1000 Gross per 24 hour  Intake 360 ml  Output 1801 ml  Net -1441 ml   LBM: Last BM Date : 02/05/23 Baseline Weight: Weight: 73 kg Most recent weight: Weight: 71.5 kg       Palliative Assessment/Data: PPS: 50%      Patient Active Problem List   Diagnosis Date Noted   COPD with acute exacerbation (HCC) 02/06/2023   Acute metabolic encephalopathy 02/06/2023   Community acquired pneumonia 02/06/2023   Atrial fibrillation with RVR (HCC) 02/05/2023   Shock liver 02/05/2023   Acute on chronic diastolic heart failure (HCC) 02/05/2023   AKI (acute kidney injury) (HCC) 02/04/2023   Sepsis (HCC) 02/03/2023   Acute respiratory failure with hypoxia and hypercapnia (HCC) 02/03/2023   New onset atrial fibrillation (HCC) 02/03/2023   Pressure injury of skin 02/03/2023   Septic shock (HCC) 02/02/2023   Seasonal allergic rhinitis due to pollen 11/19/2022   Vitamin D deficiency 05/30/2022   Moderate episode of recurrent major depressive disorder (HCC) 10/02/2021   Difficulty sleeping 10/02/2021   Osteopenia 07/04/2021   Wheezing 12/27/2020   Bilateral chronic knee pain 06/22/2020   Controlled substance agreement signed 06/22/2020   Tobacco use 06/22/2020   At high risk for falls 06/15/2018   Primary osteoarthritis involving multiple joints 06/15/2018   Leukocytosis 05/26/2017   Primary hypertension 04/28/2017   Psoriasis 04/28/2017   Hyperlipidemia 04/28/2017   GAD (generalized anxiety disorder) 04/28/2017    Palliative Care Assessment & Plan    Assessment/Recommendations/Plan  Continue current plan Patient would like prescription for nicotine patches when patient  is discharged TOC order for outpatient Palliative referral   Code Status: Full code  Prognosis:  Unable to determine  Discharge Planning: Home with Home Health  Care plan was discussed with patient and her daughter  Thank you for allowing the Palliative Medicine Team to assist in the care of this patient.  Total time: 60 minutes  Greater than 50%  of this time was spent counseling and coordinating care related to the above assessment and plan.  Ocie Bob, AGNP-C Palliative Medicine   Please contact Palliative Medicine Team phone at [phone] for questions and concerns.

## 2023-02-10 NOTE — Telephone Encounter (Signed)
Can you make this patient an appt

## 2023-02-10 NOTE — Therapy (Addendum)
SATURATION QUALIFICATIONS: (This note is used to comply with regulatory documentation for home oxygen)  Patient Saturations on Room Air at Rest = 86%  Patient Saturations on Room Air while Ambulating = 85%  Patient Saturations on 2 Liters of oxygen while Ambulating = 90%  Please briefly explain why patient needs home oxygen: Desaturates to dangerous levels at rest and with activity as low as 85% on room air.

## 2023-02-10 NOTE — TOC Progression Note (Signed)
Transition of Care [facility]) - Progression Note    Patient Details  Name: Denise Underwood MRN: [id number] Date of Birth: [birth year]  Transition of Care [facility]) CM/SW Contact  Howell Rucks, RN Phone Number: 02/10/2023, 3:10 PM  Clinical Narrative:   [facility] consulted for Outpatient Palliative. Referral sent to [facility] with [facility], will follow.          Expected Discharge Plan and Services                                               Social Determinants of Health (SDOH) Interventions SDOH Screenings   Food Insecurity: No Food Insecurity (02/04/2023)  Housing: Low Risk  (02/04/2023)  Transportation Needs: No Transportation Needs (02/04/2023)  Utilities: Not At Risk (02/04/2023)  Alcohol Screen: Low Risk  (11/07/2022)  Depression (PHQ2-9): Medium Risk (11/19/2022)  Financial Resource Strain: Low Risk  (11/07/2022)  Physical Activity: Insufficiently Active (11/07/2022)  Social Connections: Socially Isolated (11/07/2022)  Stress: No Stress Concern Present (11/07/2022)  Tobacco Use: High Risk (02/02/2023)    Readmission Risk Interventions    02/04/2023    5:21 PM  Readmission Risk Prevention Plan  Transportation Screening Complete  PCP or Specialist Appt within 5-7 Days Complete  Home Care Screening Complete  Medication Review (RN CM) Complete

## 2023-02-10 NOTE — Plan of Care (Signed)
  Problem: Safety: Goal: Ability to remain free from injury will improve Outcome: Progressing   Problem: Skin Integrity: Goal: Risk for impaired skin integrity will decrease Outcome: Progressing   

## 2023-02-10 NOTE — Progress Notes (Signed)
[facility] [phone]- [facility] Liaison Note:  Notified by [facility] manager of patient/family request for [facility] Palliative services at home after discharge.   Please call with any hospice or outpatient palliative care related questions.   Thank you for the opportunity to participate in this patient's care.   Glenna Fellows, BSN, RN, OCN [employer] [phone]

## 2023-02-10 NOTE — Progress Notes (Signed)
Physical Therapy Treatment Patient Details Name: Denise Underwood MRN: [id number] DOB: 09/14/1945 Today's Date: 02/10/2023   History of Present Illness Denise Underwood is a 77 y.o. female presented to [name] ED on 02/02/2023 with shortness of breath, hypoxic, dyspneic, tachypneic and unable to even stand at home.  In ED, was found to have new atrial fibrillation with RVR, patient converted to NSR, noted to be hypotensive,  transferred  to ICU on 02/03/2023.  Pt's past medical history relevant for COPD, tobacco abuse, chronic leukocytosis, psoriasis and recurrent candidal intertrigo of the groin/inguinal area and inframammary areas as well as recurrent falls at home.    PT Comments  The patient is ready to ambulate.  SPO2 on RA 86%, Ambulate on RA 845. Ambulate on 2 L 92%.  Per Palliative note, plans are to DC home with daughter assistance and Hospice.     If plan is discharge home, recommend the following: A little help with walking and/or transfers;Assistance with cooking/housework;A little help with bathing/dressing/bathroom;Help with stairs or ramp for entrance;Assist for transportation   Can travel by private vehicle     No  Equipment Recommendations  None recommended by PT    Recommendations for Other Services       Precautions / Restrictions Precautions Precaution Comments: monitor  VS on 2 L     Mobility  Bed Mobility   Bed Mobility: Supine to Sit, Sit to Supine     Supine to sit: Supervision Sit to supine: Supervision   General bed mobility comments: no support required    Transfers Overall transfer level: Needs assistance Equipment used: Rollator (4 wheels) Transfers: Sit to/from Stand Sit to Stand: Contact guard assist           General transfer comment: multimodal cues for hands    Ambulation/Gait Ambulation/Gait assistance: Contact guard assist Gait Distance (Feet): 40 Feet Assistive device: Rollator (4 wheels) Gait Pattern/deviations: Step-through  pattern Gait velocity: decr     General Gait Details: cues for safety   Stairs             Wheelchair Mobility     Tilt Bed    Modified Rankin (Stroke Patients Only)       Balance Overall balance assessment: Needs assistance Sitting-balance support: Single extremity supported Sitting balance-Leahy Scale: Good     Standing balance support: Bilateral upper extremity supported, During functional activity, Reliant on assistive device for balance Standing balance-Leahy Scale: Fair                              Cognition Arousal: Alert Behavior During Therapy: WFL for tasks assessed/performed Overall Cognitive Status: No family/caregiver present to determine baseline cognitive functioning Area of Impairment: Attention, Memory, Following commands, Awareness, Safety/judgement                   Current Attention Level: Alternating Memory: Decreased recall of precautions, Decreased short-term memory         General Comments: Grossly Oriented  . Delayed processing.        Exercises      General Comments        Pertinent Vitals/Pain Pain Assessment Pain Assessment: No/denies pain    Home Living                          Prior Function            PT Goals (current  goals can now be found in the care plan section) Progress towards PT goals: Progressing toward goals    Frequency    Min 1X/week      PT Plan      Co-evaluation              AM-PAC PT "6 Clicks" Mobility   Outcome Measure  Help needed turning from your back to your side while in a flat bed without using bedrails?: None Help needed moving from lying on your back to sitting on the side of a flat bed without using bedrails?: None Help needed moving to and from a bed to a chair (including a wheelchair)?: None Help needed standing up from a chair using your arms (e.g., wheelchair or bedside chair)?: A Little Help needed to walk in hospital room?: A  Little Help needed climbing 3-5 steps with a railing? : A Lot 6 Click Score: 20    End of Session Equipment Utilized During Treatment: Gait belt Activity Tolerance: Patient tolerated treatment well Patient left: in bed;with call bell/phone within reach;with bed alarm set Nurse Communication: Mobility status PT Visit Diagnosis: Unsteadiness on feet (R26.81);Muscle weakness (generalized) (M62.81)     Time: 0865-7846 PT Time Calculation (min) (ACUTE ONLY): 39 min  Charges:    $Gait Training: 23-37 mins $Self Care/Home Management: 8-22 PT General Charges $$ ACUTE PT VISIT: 1 Visit                     Blanchard Kelch PT Acute Rehabilitation Services Office [phone] Weekend pager-[phone]    Rada Hay 02/10/2023, 4:06 PM

## 2023-02-10 NOTE — Progress Notes (Signed)
Progress Note  Patient Name: Denise Underwood Date of Encounter: 02/10/2023  Primary Cardiologist:   Little Ishikawa, MD   Subjective   She thinks that the breathing is OK.  Weak.  No pain.  Inpatient Medications    Scheduled Meds:  apixaban  5 mg Oral BID   arformoterol  15 mcg Nebulization BID   budesonide (PULMICORT) nebulizer solution  0.5 mg Nebulization BID   Chlorhexidine Gluconate Cloth  6 each Topical Daily   diltiazem  60 mg Oral Q6H   furosemide  20 mg Oral Daily   hydrocortisone sod succinate (SOLU-CORTEF) inj  50 mg Intravenous Daily   metoprolol tartrate  25 mg Oral BID   nicotine  21 mg Transdermal Daily   nystatin   Topical BID   mouth rinse  15 mL Mouth Rinse 4 times per day   pantoprazole  40 mg Oral Daily   revefenacin  175 mcg Nebulization Daily   traMADol  50 mg Oral Daily   traZODone  50 mg Oral QHS   Continuous Infusions:  sodium chloride Stopped (02/04/23 0648)   PRN Meds: docusate sodium, levalbuterol, LORazepam, mouth rinse, polyethylene glycol, polyvinyl alcohol, traMADol   Vital Signs    Vitals:   02/10/23 0614 02/10/23 0703 02/10/23 0828 02/10/23 1120  BP: (!) 141/70     Pulse: 67     Resp: 15     Temp: (!) 97.5 F (36.4 C)     TempSrc: Oral     SpO2: 92%  93% 90%  Weight:  71.5 kg    Height:        Intake/Output Summary (Last 24 hours) at 02/10/2023 1201 Last data filed at 02/10/2023 1000 Gross per 24 hour  Intake 360 ml  Output 1801 ml  Net -1441 ml   Filed Weights   02/08/23 0500 02/09/23 0500 02/10/23 0703  Weight: 71 kg 73 kg 71.5 kg    Telemetry    Atrial fib with controlled ventricular rate - Personally Reviewed  ECG    NA - Personally Reviewed  Physical Exam   GEN: No acute distress.   Neck: No  JVD Cardiac: Irregular RR, no murmurs, rubs, or gallops.  Respiratory:      Decreased breath sounds, no crackles GI: Soft, nontender, non-distended  MS: No  edema; No deformity. Neuro:  Nonfocal   Psych: Normal affect   Labs    Chemistry Recent Labs  Lab 02/06/23 0509 02/07/23 0554 02/08/23 0654 02/09/23 0337 02/10/23 0327  NA 139 141 141 142 140  K 3.7 3.0* 2.8* 3.4* 3.1*  CL 97* 91* 85* 93* 94*  CO2 32 40* 41* 37* 35*  GLUCOSE 122* 153* 129* 110* 106*  BUN 53* 46* 42* 38* 39*  CREATININE 1.54* 1.20* 1.07* 1.07* 1.13*  CALCIUM 9.6 9.4 9.6 9.3 9.1  PROT 6.5 6.2* 6.2*  --   --   ALBUMIN 2.5* 2.4* 2.7*  --   --   AST 108* 52* 38  --   --   ALT 198* 139* 101*  --   --   ALKPHOS 122 105 97  --   --   BILITOT 0.9 0.8 1.3*  --   --   GFRNONAA 35* 47* 53* 53* 50*  ANIONGAP 10 10 15 12 11      Hematology Recent Labs  Lab 02/07/23 0605 02/08/23 0654 02/09/23 0337  WBC 31.8* 33.3* 27.8*  RBC 3.69* 3.86* 3.79*  HGB 12.3 13.0 12.8  HCT 38.4  40.4 40.5  MCV 104.1* 104.7* 106.9*  MCH 33.3 33.7 33.8  MCHC 32.0 32.2 31.6  RDW 13.2 13.4 13.5  PLT 185 191 176    Cardiac EnzymesNo results for input(s): "TROPONINI" in the last 168 hours. No results for input(s): "TROPIPOC" in the last 168 hours.   BNPNo results for input(s): "BNP", "PROBNP" in the last 168 hours.   DDimer No results for input(s): "DDIMER" in the last 168 hours.   Radiology    No results found.  Cardiac Studies   ECHO:   1. Left ventricular ejection fraction, by estimation, is 60 to 65%. The  left ventricle has normal function. The left ventricle has no regional  wall motion abnormalities. Left ventricular diastolic parameters are  consistent with Grade I diastolic  dysfunction (impaired relaxation).   2. Right ventricular systolic function is normal. The right ventricular  size is mildly enlarged. There is normal pulmonary artery systolic  pressure.   3. Left atrial size was mildly dilated.   4. Right atrial size was mildly dilated.   5. The mitral valve is degenerative. No evidence of mitral valve  regurgitation. No evidence of mitral stenosis.   6. The aortic valve is calcified. Aortic  valve regurgitation is not  visualized. Mild aortic valve stenosis. Aortic valve mean gradient  measures 14.0 mmHg. Aortic valve Vmax measures 2.48 m/s.   7. The inferior vena cava is dilated in size with <50% respiratory  variability, suggesting right atrial pressure of 15 mmHg.    Patient Profile     77 y.o. female with a hx of HTN, HLD, prediabetes, arthritis, COPD, tobacco use who is being seen 02/05/2023 for the evaluation of atrial fibrillation   Assessment & Plan    PAF:  Still in atrial fib but rate is controlled.  Tolerating Eliquis.  Fib has been paroxysmal this admission.  I would not plan cardioversion as long as her rate is controllable.  We can think about whether this is indicated if she remains in fib after recovery from this acute illness.  We will follow as an out patient.   Change to Cardizem CD today.     AS: Mild.  Follow clinically.    Elevated trop:  Suspect demand ischemia.   No ischemia work up planned this admission.     Acute on chronic diastolic dysfunction:    Continue PO Lasix.    Hypokalemia:  Will order supplement for today.     For questions or updates, please contact [facility] Please consult [email or web address] for contact info under Cardiology/STEMI.   Signed, Rollene Rotunda, MD  02/10/2023, 12:01 PM

## 2023-02-10 NOTE — Progress Notes (Signed)
NAME:  Denise Underwood, MRN:  [id number], DOB:  [birth year], LOS: 8 ADMISSION DATE:  02/02/2023, CONSULTATION DATE:  02/02/2023 REFERRING MD:  Eber Hong, MD EDP, CHIEF COMPLAINT:  acute respiratory failure    History of Present Illness:  77 year old female with past medical history of hypertension, hyperlipidemia, pre-diabetes, arthritis, leukocytosis, COPD, tobacco use who presented to [facility] emergency department on 02/02/23 for shortness of breath. Noted to have 1-2 days of worsening shortness of breath and found to have room air sat of 76% by EMS. Noted to be dyspneic and unable to even stand at home. In the ED, tachypneic, new atrial fibrillation with RVR,. She was placed on oxygen. Cardizem drip was started and the patient converted to normal sinus rhythm. Subsequently, she continued to be hypotensive and 2L IVF were ordered and eventually required vasopressor support. EDP placed central line. Additionally, her laboratory work up showed new WBC 34.5, Cl 93, BUN/Cr 46/ 2.21, AST 768, ALT 445, troponin 84>109>117, BNP 697, lactic wnl. UA without UTI. BCX sent. CXR showing edema vs atypical infection, CT AP showing RLL infilatrate, small right pleural effusion, no acute intraabdominal findings. ABG ordered showed pH 7.15, pCO2 96, pO2 161. Placed on BiPAP. Repeat 7.19/77/103. She was started on Rocephin, Zithromax.  PCCM was consulted for admission.   Pertinent  Medical History  hypertension, hyperlipidemia, pre-diabetes, arthritis, leukocytosis, COPD, tobacco use  Significant Hospital Events: Including procedures, antibiotic start and stop dates in addition to other pertinent events   9/23: admitted to [facility] 9/24: no acute events overnight. She was anxious per overnight notes and was eventually started on precedex gtt to help with BiPAP.  9/26 minimally responsive early AM. Reportedly had worn BiPAP overnight, but was also agitated and received olanzapine injection.  9/28 decreased  responsiveness, refused BiPAP last night 9/29 Continues to refuse BiPAP  Interim History / Subjective:   Afebrile Breathing better   Objective   Blood pressure (!) 141/70, pulse 67, temperature (!) 97.5 F (36.4 C), temperature source Oral, resp. rate 15, height 5\' 4"  (1.626 m), weight 71.5 kg, SpO2 90%.        Intake/Output Summary (Last 24 hours) at 02/10/2023 1308 Last data filed at 02/10/2023 1000 Gross per 24 hour  Intake 360 ml  Output 1801 ml  Net -1441 ml   Filed Weights   02/08/23 0500 02/09/23 0500 02/10/23 0703  Weight: 71 kg 73 kg 71.5 kg    Examination: General: Elderly, sitting up in bed, does not appear to be in distress HENT: Pupils equal and reactive, anicteric Lungs: Decreased breath sounds bilateral, no rhonchi, no accessory muscle use Cardiovascular: S1-S2 appreciated Abdomen: Soft, nontender Extremities: No edema Neuro: Awake, alert, nonfocal  Labs show hypokalemia 3.1, stable creatinine 1.1, low magnesium 1.5  Resolved Hospital Problem list   Shock  Assessment & Plan:   Community-acquired pneumonia Sepsis Viral panel testing, MRSA PCR, Legionella and streptococcal antigens negative -Completed course of antibiotics  Acute metabolic encephalopathy  -Continue to limit sedating medications -VBG pCO2 of 62  Acute hypoxic/hypercarbic respiratory failure secondary to pneumonia COPD exacerbation Noncompliant with BiPAP -Continue bronchodilators -Wean steroids over 2 weeks -Although she will qualify for home NIV, she is not able to use this during the hospital it is  unlikely that she will be compliant at home -Evaluate for oxygen during discharge  New onset atrial fibrillation -Appreciate cardiology following -Optimizing calcium channel and beta blockers  Tobacco abuse -Does have a nicotine patch in place -Smoking cessation emphasized  Out patient pulmonary office follow-up in [facility] PCCM will be available as needed  Best  Practice (right click and "Reselect all SmartList Selections" daily)   Per TRH Code Status:  full code Last date of multidisciplinary goals of care discussion palliative care involved  Labs   CBC: Recent Labs  Lab 02/03/23 1829 02/04/23 0459 02/07/23 0605 02/08/23 0654 02/09/23 0337  WBC 39.3* 37.4* 31.8* 33.3* 27.8*  NEUTROABS 37.7*  --   --   --   --   HGB 11.1* 11.1* 12.3 13.0 12.8  HCT 36.8 35.7* 38.4 40.4 40.5  MCV 112.9* 110.9* 104.1* 104.7* 106.9*  PLT 212 230 185 191 176    Basic Metabolic Panel: Recent Labs  Lab 02/04/23 0459 02/05/23 0510 02/06/23 0509 02/07/23 0554 02/08/23 0654 02/09/23 0337 02/10/23 0327  NA 141   < > 139 141 141 142 140  K 4.7   < > 3.7 3.0* 2.8* 3.4* 3.1*  CL 101   < > 97* 91* 85* 93* 94*  CO2 29   < > 32 40* 41* 37* 35*  GLUCOSE 135*   < > 122* 153* 129* 110* 106*  BUN 50*   < > 53* 46* 42* 38* 39*  CREATININE 1.85*   < > 1.54* 1.20* 1.07* 1.07* 1.13*  CALCIUM 8.8*   < > 9.6 9.4 9.6 9.3 9.1  MG 2.1  --   --  1.6* 2.0  --  1.5*   < > = values in this interval not displayed.   GFR: Estimated Creatinine Clearance: 40.4 mL/min (A) (by C-G formula based on SCr of 1.13 mg/dL (H)). Recent Labs  Lab 02/04/23 0459 02/07/23 0605 02/08/23 0654 02/09/23 0337  WBC 37.4* 31.8* 33.3* 27.8*      ABG    Component Value Date/Time   PHART 7.35 02/06/2023 0811   PCO2ART 78 (HH) 02/06/2023 0811   PO2ART 97 02/06/2023 0811   HCO3 54.2 (H) 02/08/2023 0924   ACIDBASEDEF 0.9 02/03/2023 0132   O2SAT 92.7 02/08/2023 0924    Cyril Mourning MD. FCCP. [facility] Pager : [phone]  If no response to pager , please call [phone] until 7 pm After 7:00 pm call Elink  [phone]   02/10/2023

## 2023-02-11 ENCOUNTER — Other Ambulatory Visit: Payer: Self-pay | Admitting: Family Medicine

## 2023-02-11 DIAGNOSIS — F17210 Nicotine dependence, cigarettes, uncomplicated: Secondary | ICD-10-CM | POA: Diagnosis not present

## 2023-02-11 DIAGNOSIS — J9601 Acute respiratory failure with hypoxia: Secondary | ICD-10-CM | POA: Diagnosis not present

## 2023-02-11 DIAGNOSIS — A419 Sepsis, unspecified organism: Secondary | ICD-10-CM | POA: Diagnosis not present

## 2023-02-11 DIAGNOSIS — M150 Primary generalized (osteo)arthritis: Secondary | ICD-10-CM

## 2023-02-11 DIAGNOSIS — F411 Generalized anxiety disorder: Secondary | ICD-10-CM

## 2023-02-11 DIAGNOSIS — R6521 Severe sepsis with septic shock: Secondary | ICD-10-CM | POA: Diagnosis not present

## 2023-02-11 LAB — GLUCOSE, CAPILLARY
Glucose-Capillary: 104 mg/dL — ABNORMAL HIGH (ref 70–99)
Glucose-Capillary: 139 mg/dL — ABNORMAL HIGH (ref 70–99)
Glucose-Capillary: 95 mg/dL (ref 70–99)

## 2023-02-11 MED ORDER — POTASSIUM CHLORIDE CRYS ER 20 MEQ PO TBCR
40.0000 meq | EXTENDED_RELEASE_TABLET | Freq: Once | ORAL | Status: AC
  Administered 2023-02-11: 40 meq via ORAL
  Filled 2023-02-11: qty 2

## 2023-02-11 MED ORDER — PREDNISONE 10 MG PO TABS: ORAL_TABLET | ORAL | 0 refills | Status: DC

## 2023-02-11 MED ORDER — PREDNISONE 20 MG PO TABS
40.0000 mg | ORAL_TABLET | Freq: Every day | ORAL | Status: DC
  Administered 2023-02-11: 40 mg via ORAL
  Filled 2023-02-11: qty 2

## 2023-02-11 MED ORDER — NICOTINE 21 MG/24HR TD PT24: MEDICATED_PATCH | TRANSDERMAL | 1 refills | Status: DC

## 2023-02-11 MED ORDER — PANTOPRAZOLE SODIUM 40 MG PO TBEC: 40.0000 mg | DELAYED_RELEASE_TABLET | Freq: Every day | ORAL | 3 refills | Status: DC

## 2023-02-11 MED ORDER — POTASSIUM CHLORIDE CRYS ER 20 MEQ PO TBCR: 40.0000 meq | EXTENDED_RELEASE_TABLET | Freq: Every day | ORAL | 3 refills | Status: DC

## 2023-02-11 MED ORDER — DILTIAZEM HCL ER COATED BEADS 180 MG PO CP24: 180.0000 mg | ORAL_CAPSULE | Freq: Every day | ORAL | Status: DC

## 2023-02-11 MED ORDER — WIXELA INHUB 250-50 MCG/ACT IN AEPB: 1.0000 | INHALATION_SPRAY | Freq: Two times a day (BID) | RESPIRATORY_TRACT | 3 refills | Status: DC

## 2023-02-11 MED ORDER — APIXABAN 5 MG PO TABS: 5.0000 mg | ORAL_TABLET | Freq: Two times a day (BID) | ORAL | 3 refills | Status: DC

## 2023-02-11 MED ORDER — FUROSEMIDE 20 MG PO TABS: 20.0000 mg | ORAL_TABLET | Freq: Every day | ORAL | 3 refills | Status: DC

## 2023-02-11 MED ORDER — METOPROLOL TARTRATE 25 MG PO TABS: 25.0000 mg | ORAL_TABLET | Freq: Two times a day (BID) | ORAL | 3 refills | Status: DC

## 2023-02-11 MED ORDER — DILTIAZEM HCL ER COATED BEADS 180 MG PO CP24: 180.0000 mg | ORAL_CAPSULE | Freq: Every day | ORAL | 3 refills | Status: DC

## 2023-02-11 NOTE — Discharge Summary (Signed)
Physician Discharge Summary   Patient: Denise Underwood MRN: [id number] DOB: 03-27-1946  Admit date:     02/02/2023  Discharge date: 02/11/23  Discharge Physician: Thad Ranger, MD    PCP: Sonny Masters, FNP   Recommendations at discharge:   Started on Lasix 20 mg daily, potassium 40 mEq daily Started on Cardizem 180 mg daily, metoprolol 25 mg twice daily Continue prednisone with taper, 40 mg for 2 days, 30 mg for 3 days, 20 mg for 3 days, 10 mg for 3 days then off Continue albuterol inhaler as needed for shortness of breath, wheezing, started on maintenance Wixela 250-50 one puff twice daily.   Outpatient pulm and cardiology follow-ups as scheduled  O2 saturation qualifications Patient Saturations on Room Air at Rest = 84% Patient Saturations on Room Air while Ambulating = 81% Patient Saturations on 3 Liters of oxygen while Ambulating = 92%  Discharge Diagnoses:    Septic shock (HCC)   Acute respiratory failure with hypoxia and hypercapnia (HCC)   New onset atrial fibrillation (HCC) Elevated troponins   Pressure injury of skin   AKI (acute kidney injury) (HCC)   Atrial fibrillation with RVR (HCC)   Shock liver Hypokalemia   Acute on chronic diastolic heart failure (HCC)   COPD with acute exacerbation (HCC)   Acute metabolic encephalopathy-resolved   Community acquired pneumonia    Hospital Course:  Patient is a 77 year old female with HTN, HLP, prediabetes, arthritis, COPD, tobacco use presented to [name], ED on 02/02/2023 with shortness of breath.  Patient had noted 1 to 2 days of worsening shortness of breath, was found hypoxic with O2 sat 76% by EMS, dyspneic, tachypneic and unable to even stand at home. In ED, was found to have new atrial fibrillation with RVR, was placed on O2, Cardizem drip was started, patient converted to NSR. Subsequently noted to be hypotensive, received IV fluids and eventually required vasopressor support.  Patient was transferred and by PCCM  to ICU on 02/03/2023   CTAP showed right lower lung infiltrates, small right pleural effusion.  ABG showed pH 7.15, pCO2 96, pO2 161, was placed on BiPAP and IV antibiotics. WBCs 34.5, creatinine 2.21, AST 760, ALT 445, troponin 84> 117.  BNP 697.    Assessment and Plan:   Septic shock (HCC), community-acquired pneumonia -CT abdomen pelvis showed right lower lung infiltrates, UA negative for UTI, no intra-abdominal pathology. -Chest x-ray 02/04/2023 with worsening RLL infiltrates -Off vasopressors, blood cultures 9/22 negative so far -Flu, RSV, COVID-negative, respiratory virus panel negative, urine strept antigen negative, Legionella antigen negative -Completed IV ceftriaxone and Zithromax x 5 days -Sepsis physiology resolved       Acute respiratory failure with hypoxia and hypercapnia (HCC) Community-acquired pneumonia, underlying COPD, tobacco use, RLL pleural effusion -Not on O2 at baseline -Pulmonology following, continue Pulmicort, Brovana, Yupelri, Xopenex, flutter valve -Currently on 2 L O2 via nasal cannula, wean as tolerated, will need home O2 evaluation -IV steroid weaned off, pulmonology recommending prednisone taper  - may need nocturnal BiPAP for home however she is noncompliant with BiPAP here inpatient, refusing.  So unlikely she will use it at home. -Outpatient pulmonology appointment scheduled with Dr. Sherene Sires -Home O2 evaluation completed, needs 3 L O2      New onset atrial fibrillation with RVR (HCC), elevated troponins Volume overload with acute diastolic CHF -Elevated troponins likely due to demand ischemia from respiratory failure, septic shock -2D echo showed EF 60 to 65%, G1 DD, normal right ventricular systolic function -  Physician Discharge Summary   Patient: Denise Underwood MRN: [id number] DOB: 03-27-1946  Admit date:     02/02/2023  Discharge date: 02/11/23  Discharge Physician: Thad Ranger, MD    PCP: Sonny Masters, FNP   Recommendations at discharge:   Started on Lasix 20 mg daily, potassium 40 mEq daily Started on Cardizem 180 mg daily, metoprolol 25 mg twice daily Continue prednisone with taper, 40 mg for 2 days, 30 mg for 3 days, 20 mg for 3 days, 10 mg for 3 days then off Continue albuterol inhaler as needed for shortness of breath, wheezing, started on maintenance Wixela 250-50 one puff twice daily.   Outpatient pulm and cardiology follow-ups as scheduled  O2 saturation qualifications Patient Saturations on Room Air at Rest = 84% Patient Saturations on Room Air while Ambulating = 81% Patient Saturations on 3 Liters of oxygen while Ambulating = 92%  Discharge Diagnoses:    Septic shock (HCC)   Acute respiratory failure with hypoxia and hypercapnia (HCC)   New onset atrial fibrillation (HCC) Elevated troponins   Pressure injury of skin   AKI (acute kidney injury) (HCC)   Atrial fibrillation with RVR (HCC)   Shock liver Hypokalemia   Acute on chronic diastolic heart failure (HCC)   COPD with acute exacerbation (HCC)   Acute metabolic encephalopathy-resolved   Community acquired pneumonia    Hospital Course:  Patient is a 77 year old female with HTN, HLP, prediabetes, arthritis, COPD, tobacco use presented to [name], ED on 02/02/2023 with shortness of breath.  Patient had noted 1 to 2 days of worsening shortness of breath, was found hypoxic with O2 sat 76% by EMS, dyspneic, tachypneic and unable to even stand at home. In ED, was found to have new atrial fibrillation with RVR, was placed on O2, Cardizem drip was started, patient converted to NSR. Subsequently noted to be hypotensive, received IV fluids and eventually required vasopressor support.  Patient was transferred and by PCCM  to ICU on 02/03/2023   CTAP showed right lower lung infiltrates, small right pleural effusion.  ABG showed pH 7.15, pCO2 96, pO2 161, was placed on BiPAP and IV antibiotics. WBCs 34.5, creatinine 2.21, AST 760, ALT 445, troponin 84> 117.  BNP 697.    Assessment and Plan:   Septic shock (HCC), community-acquired pneumonia -CT abdomen pelvis showed right lower lung infiltrates, UA negative for UTI, no intra-abdominal pathology. -Chest x-ray 02/04/2023 with worsening RLL infiltrates -Off vasopressors, blood cultures 9/22 negative so far -Flu, RSV, COVID-negative, respiratory virus panel negative, urine strept antigen negative, Legionella antigen negative -Completed IV ceftriaxone and Zithromax x 5 days -Sepsis physiology resolved       Acute respiratory failure with hypoxia and hypercapnia (HCC) Community-acquired pneumonia, underlying COPD, tobacco use, RLL pleural effusion -Not on O2 at baseline -Pulmonology following, continue Pulmicort, Brovana, Yupelri, Xopenex, flutter valve -Currently on 2 L O2 via nasal cannula, wean as tolerated, will need home O2 evaluation -IV steroid weaned off, pulmonology recommending prednisone taper  - may need nocturnal BiPAP for home however she is noncompliant with BiPAP here inpatient, refusing.  So unlikely she will use it at home. -Outpatient pulmonology appointment scheduled with Dr. Sherene Sires -Home O2 evaluation completed, needs 3 L O2      New onset atrial fibrillation with RVR (HCC), elevated troponins Volume overload with acute diastolic CHF -Elevated troponins likely due to demand ischemia from respiratory failure, septic shock -2D echo showed EF 60 to 65%, G1 DD, normal right ventricular systolic function -  Physician Discharge Summary   Patient: Denise Underwood MRN: [id number] DOB: 03-27-1946  Admit date:     02/02/2023  Discharge date: 02/11/23  Discharge Physician: Thad Ranger, MD    PCP: Sonny Masters, FNP   Recommendations at discharge:   Started on Lasix 20 mg daily, potassium 40 mEq daily Started on Cardizem 180 mg daily, metoprolol 25 mg twice daily Continue prednisone with taper, 40 mg for 2 days, 30 mg for 3 days, 20 mg for 3 days, 10 mg for 3 days then off Continue albuterol inhaler as needed for shortness of breath, wheezing, started on maintenance Wixela 250-50 one puff twice daily.   Outpatient pulm and cardiology follow-ups as scheduled  O2 saturation qualifications Patient Saturations on Room Air at Rest = 84% Patient Saturations on Room Air while Ambulating = 81% Patient Saturations on 3 Liters of oxygen while Ambulating = 92%  Discharge Diagnoses:    Septic shock (HCC)   Acute respiratory failure with hypoxia and hypercapnia (HCC)   New onset atrial fibrillation (HCC) Elevated troponins   Pressure injury of skin   AKI (acute kidney injury) (HCC)   Atrial fibrillation with RVR (HCC)   Shock liver Hypokalemia   Acute on chronic diastolic heart failure (HCC)   COPD with acute exacerbation (HCC)   Acute metabolic encephalopathy-resolved   Community acquired pneumonia    Hospital Course:  Patient is a 77 year old female with HTN, HLP, prediabetes, arthritis, COPD, tobacco use presented to [name], ED on 02/02/2023 with shortness of breath.  Patient had noted 1 to 2 days of worsening shortness of breath, was found hypoxic with O2 sat 76% by EMS, dyspneic, tachypneic and unable to even stand at home. In ED, was found to have new atrial fibrillation with RVR, was placed on O2, Cardizem drip was started, patient converted to NSR. Subsequently noted to be hypotensive, received IV fluids and eventually required vasopressor support.  Patient was transferred and by PCCM  to ICU on 02/03/2023   CTAP showed right lower lung infiltrates, small right pleural effusion.  ABG showed pH 7.15, pCO2 96, pO2 161, was placed on BiPAP and IV antibiotics. WBCs 34.5, creatinine 2.21, AST 760, ALT 445, troponin 84> 117.  BNP 697.    Assessment and Plan:   Septic shock (HCC), community-acquired pneumonia -CT abdomen pelvis showed right lower lung infiltrates, UA negative for UTI, no intra-abdominal pathology. -Chest x-ray 02/04/2023 with worsening RLL infiltrates -Off vasopressors, blood cultures 9/22 negative so far -Flu, RSV, COVID-negative, respiratory virus panel negative, urine strept antigen negative, Legionella antigen negative -Completed IV ceftriaxone and Zithromax x 5 days -Sepsis physiology resolved       Acute respiratory failure with hypoxia and hypercapnia (HCC) Community-acquired pneumonia, underlying COPD, tobacco use, RLL pleural effusion -Not on O2 at baseline -Pulmonology following, continue Pulmicort, Brovana, Yupelri, Xopenex, flutter valve -Currently on 2 L O2 via nasal cannula, wean as tolerated, will need home O2 evaluation -IV steroid weaned off, pulmonology recommending prednisone taper  - may need nocturnal BiPAP for home however she is noncompliant with BiPAP here inpatient, refusing.  So unlikely she will use it at home. -Outpatient pulmonology appointment scheduled with Dr. Sherene Sires -Home O2 evaluation completed, needs 3 L O2      New onset atrial fibrillation with RVR (HCC), elevated troponins Volume overload with acute diastolic CHF -Elevated troponins likely due to demand ischemia from respiratory failure, septic shock -2D echo showed EF 60 to 65%, G1 DD, normal right ventricular systolic function -  Physician Discharge Summary   Patient: Denise Underwood MRN: [id number] DOB: 03-27-1946  Admit date:     02/02/2023  Discharge date: 02/11/23  Discharge Physician: Thad Ranger, MD    PCP: Sonny Masters, FNP   Recommendations at discharge:   Started on Lasix 20 mg daily, potassium 40 mEq daily Started on Cardizem 180 mg daily, metoprolol 25 mg twice daily Continue prednisone with taper, 40 mg for 2 days, 30 mg for 3 days, 20 mg for 3 days, 10 mg for 3 days then off Continue albuterol inhaler as needed for shortness of breath, wheezing, started on maintenance Wixela 250-50 one puff twice daily.   Outpatient pulm and cardiology follow-ups as scheduled  O2 saturation qualifications Patient Saturations on Room Air at Rest = 84% Patient Saturations on Room Air while Ambulating = 81% Patient Saturations on 3 Liters of oxygen while Ambulating = 92%  Discharge Diagnoses:    Septic shock (HCC)   Acute respiratory failure with hypoxia and hypercapnia (HCC)   New onset atrial fibrillation (HCC) Elevated troponins   Pressure injury of skin   AKI (acute kidney injury) (HCC)   Atrial fibrillation with RVR (HCC)   Shock liver Hypokalemia   Acute on chronic diastolic heart failure (HCC)   COPD with acute exacerbation (HCC)   Acute metabolic encephalopathy-resolved   Community acquired pneumonia    Hospital Course:  Patient is a 77 year old female with HTN, HLP, prediabetes, arthritis, COPD, tobacco use presented to [name], ED on 02/02/2023 with shortness of breath.  Patient had noted 1 to 2 days of worsening shortness of breath, was found hypoxic with O2 sat 76% by EMS, dyspneic, tachypneic and unable to even stand at home. In ED, was found to have new atrial fibrillation with RVR, was placed on O2, Cardizem drip was started, patient converted to NSR. Subsequently noted to be hypotensive, received IV fluids and eventually required vasopressor support.  Patient was transferred and by PCCM  to ICU on 02/03/2023   CTAP showed right lower lung infiltrates, small right pleural effusion.  ABG showed pH 7.15, pCO2 96, pO2 161, was placed on BiPAP and IV antibiotics. WBCs 34.5, creatinine 2.21, AST 760, ALT 445, troponin 84> 117.  BNP 697.    Assessment and Plan:   Septic shock (HCC), community-acquired pneumonia -CT abdomen pelvis showed right lower lung infiltrates, UA negative for UTI, no intra-abdominal pathology. -Chest x-ray 02/04/2023 with worsening RLL infiltrates -Off vasopressors, blood cultures 9/22 negative so far -Flu, RSV, COVID-negative, respiratory virus panel negative, urine strept antigen negative, Legionella antigen negative -Completed IV ceftriaxone and Zithromax x 5 days -Sepsis physiology resolved       Acute respiratory failure with hypoxia and hypercapnia (HCC) Community-acquired pneumonia, underlying COPD, tobacco use, RLL pleural effusion -Not on O2 at baseline -Pulmonology following, continue Pulmicort, Brovana, Yupelri, Xopenex, flutter valve -Currently on 2 L O2 via nasal cannula, wean as tolerated, will need home O2 evaluation -IV steroid weaned off, pulmonology recommending prednisone taper  - may need nocturnal BiPAP for home however she is noncompliant with BiPAP here inpatient, refusing.  So unlikely she will use it at home. -Outpatient pulmonology appointment scheduled with Dr. Sherene Sires -Home O2 evaluation completed, needs 3 L O2      New onset atrial fibrillation with RVR (HCC), elevated troponins Volume overload with acute diastolic CHF -Elevated troponins likely due to demand ischemia from respiratory failure, septic shock -2D echo showed EF 60 to 65%, G1 DD, normal right ventricular systolic function -  Discharge Exam: Filed Weights   02/08/23 0500 02/09/23 0500 02/10/23 0703  Weight: 71 kg 73 kg 71.5 kg    S: Seen this morning, eating breakfast, really wants to go home today, no fevers or chills, chest pain.  Shortness of breath is improving.  Physical Exam General: Alert and oriented x 3, NAD Cardiovascular: S1 S2 clear, RRR.  Respiratory: CTAB, no wheezing, rales Gastrointestinal: Soft, nontender, nondistended, NBS Ext: no pedal edema bilaterally Neuro: no new deficits Skin: No rashes Psych: Normal affect and demeanor, alert and oriented x3    Condition at discharge: fair  The results of significant diagnostics from this hospitalization (including imaging, microbiology, ancillary and laboratory) are listed below for reference.   Imaging Studies: DG Chest Port 1 View  Result Date: 02/04/2023 CLINICAL DATA:  Provided history: Pneumonia. EXAM: PORTABLE CHEST 1 VIEW COMPARISON:  Chest radiographs 02/03/2023 and earlier FINDINGS: Right IJ approach central venous catheter with tip at the level of the lower SVC. The cardiomediastinal silhouette is unchanged. Aortic atherosclerosis. Prominence of the interstitial lung markings. Superimposed ill-defined opacities within the right lung base, progressed from the prior chest radiograph of 02/03/2023. No evidence of pleural effusion or pneumothorax. Thoracic spondylosis. IMPRESSION: 1. Persistent prominence of the interstitial lung markings, which may reflect interstitial edema or atypical  infection. 2. Superimposed ill-defined opacities within the right lung base, slightly progressed. This may reflect atelectasis and/or pneumonia. 3. Aortic Atherosclerosis (ICD10-I70.0). Electronically Signed   By: Jackey Loge D.O.   On: 02/04/2023 10:22   ECHOCARDIOGRAM COMPLETE  Result Date: 02/04/2023    ECHOCARDIOGRAM REPORT   [name] Date of Exam: 02/04/2023 Medical Rec #:  [id number]      Height:       64.0 in Accession #:    [id number]     Weight:       171.7 lb Date of Birth:  [birth year]      BSA:          1.834 m Patient Age:    76 years       BP:           107/67 mmHg Patient Gender: F              HR:           85 bpm. Exam Location:  Inpatient Procedure: 2D Echo, Color Doppler and Cardiac Doppler Indications:    I48.91* Unspeicified atrial fibrillation  History:        Patient has no prior history of Echocardiogram examinations.                 Abnormal ECG, Arrythmias:Atrial Fibrillation,                 Signs/Symptoms:Dyspnea and Shortness of Breath; Risk                 Factors:Current Smoker and Hypertension. Shock. Substance abuse.                 Hypoxia.  Sonographer:    Sheralyn Boatman RDCS Referring Phys: [id number] Cristopher Peru  Sonographer Comments: Technically difficult study due to poor echo windows. Image acquisition challenging due to uncooperative patient. Patient moving throughout exam, had to constantly re-direct. IMPRESSIONS  1. Left ventricular ejection fraction, by estimation, is 60 to 65%. The left ventricle has normal function. The left ventricle has no regional wall motion abnormalities. Left ventricular diastolic parameters are consistent with Grade I diastolic dysfunction (impaired relaxation).  Physician Discharge Summary   Patient: Denise Underwood MRN: [id number] DOB: 03-27-1946  Admit date:     02/02/2023  Discharge date: 02/11/23  Discharge Physician: Thad Ranger, MD    PCP: Sonny Masters, FNP   Recommendations at discharge:   Started on Lasix 20 mg daily, potassium 40 mEq daily Started on Cardizem 180 mg daily, metoprolol 25 mg twice daily Continue prednisone with taper, 40 mg for 2 days, 30 mg for 3 days, 20 mg for 3 days, 10 mg for 3 days then off Continue albuterol inhaler as needed for shortness of breath, wheezing, started on maintenance Wixela 250-50 one puff twice daily.   Outpatient pulm and cardiology follow-ups as scheduled  O2 saturation qualifications Patient Saturations on Room Air at Rest = 84% Patient Saturations on Room Air while Ambulating = 81% Patient Saturations on 3 Liters of oxygen while Ambulating = 92%  Discharge Diagnoses:    Septic shock (HCC)   Acute respiratory failure with hypoxia and hypercapnia (HCC)   New onset atrial fibrillation (HCC) Elevated troponins   Pressure injury of skin   AKI (acute kidney injury) (HCC)   Atrial fibrillation with RVR (HCC)   Shock liver Hypokalemia   Acute on chronic diastolic heart failure (HCC)   COPD with acute exacerbation (HCC)   Acute metabolic encephalopathy-resolved   Community acquired pneumonia    Hospital Course:  Patient is a 77 year old female with HTN, HLP, prediabetes, arthritis, COPD, tobacco use presented to [name] ED on 02/02/2023 with shortness of breath.  Patient had noted 1 to 2 days of worsening shortness of breath, was found hypoxic with O2 sat 76% by EMS, dyspneic, tachypneic and unable to even stand at home. In ED, was found to have new atrial fibrillation with RVR, was placed on O2, Cardizem drip was started, patient converted to NSR. Subsequently noted to be hypotensive, received IV fluids and eventually required vasopressor support.  Patient was transferred and by PCCM  to ICU on 02/03/2023   CTAP showed right lower lung infiltrates, small right pleural effusion.  ABG showed pH 7.15, pCO2 96, pO2 161, was placed on BiPAP and IV antibiotics. WBCs 34.5, creatinine 2.21, AST 760, ALT 445, troponin 84> 117.  BNP 697.    Assessment and Plan:   Septic shock (HCC), community-acquired pneumonia -CT abdomen pelvis showed right lower lung infiltrates, UA negative for UTI, no intra-abdominal pathology. -Chest x-ray 02/04/2023 with worsening RLL infiltrates -Off vasopressors, blood cultures 9/22 negative so far -Flu, RSV, COVID-negative, respiratory virus panel negative, urine strept antigen negative, Legionella antigen negative -Completed IV ceftriaxone and Zithromax x 5 days -Sepsis physiology resolved       Acute respiratory failure with hypoxia and hypercapnia (HCC) Community-acquired pneumonia, underlying COPD, tobacco use, RLL pleural effusion -Not on O2 at baseline -Pulmonology following, continue Pulmicort, Brovana, Yupelri, Xopenex, flutter valve -Currently on 2 L O2 via nasal cannula, wean as tolerated, will need home O2 evaluation -IV steroid weaned off, pulmonology recommending prednisone taper  - may need nocturnal BiPAP for home however she is noncompliant with BiPAP here inpatient, refusing.  So unlikely she will use it at home. -Outpatient pulmonology appointment scheduled with Dr. Sherene Sires -Home O2 evaluation completed, needs 3 L O2      New onset atrial fibrillation with RVR (HCC), elevated troponins Volume overload with acute diastolic CHF -Elevated troponins likely due to demand ischemia from respiratory failure, septic shock -2D echo showed EF 60 to 65%, G1 DD, normal right ventricular systolic function -  Discharge Exam: Filed Weights   02/08/23 0500 02/09/23 0500 02/10/23 0703  Weight: 71 kg 73 kg 71.5 kg    S: Seen this morning, eating breakfast, really wants to go home today, no fevers or chills, chest pain.  Shortness of breath is improving.  Physical Exam General: Alert and oriented x 3, NAD Cardiovascular: S1 S2 clear, RRR.  Respiratory: CTAB, no wheezing, rales Gastrointestinal: Soft, nontender, nondistended, NBS Ext: no pedal edema bilaterally Neuro: no new deficits Skin: No rashes Psych: Normal affect and demeanor, alert and oriented x3    Condition at discharge: fair  The results of significant diagnostics from this hospitalization (including imaging, microbiology, ancillary and laboratory) are listed below for reference.   Imaging Studies: DG Chest Port 1 View  Result Date: 02/04/2023 CLINICAL DATA:  Provided history: Pneumonia. EXAM: PORTABLE CHEST 1 VIEW COMPARISON:  Chest radiographs 02/03/2023 and earlier FINDINGS: Right IJ approach central venous catheter with tip at the level of the lower SVC. The cardiomediastinal silhouette is unchanged. Aortic atherosclerosis. Prominence of the interstitial lung markings. Superimposed ill-defined opacities within the right lung base, progressed from the prior chest radiograph of 02/03/2023. No evidence of pleural effusion or pneumothorax. Thoracic spondylosis. IMPRESSION: 1. Persistent prominence of the interstitial lung markings, which may reflect interstitial edema or atypical  infection. 2. Superimposed ill-defined opacities within the right lung base, slightly progressed. This may reflect atelectasis and/or pneumonia. 3. Aortic Atherosclerosis (ICD10-I70.0). Electronically Signed   By: Jackey Loge D.O.   On: 02/04/2023 10:22   ECHOCARDIOGRAM COMPLETE  Result Date: 02/04/2023    ECHOCARDIOGRAM REPORT   [name] Date of Exam: 02/04/2023 Medical Rec #:  [id number]      Height:       64.0 in Accession #:    [id number]     Weight:       171.7 lb Date of Birth:  [birth year]      BSA:          1.834 m Patient Age:    76 years       BP:           107/67 mmHg Patient Gender: F              HR:           85 bpm. Exam Location:  Inpatient Procedure: 2D Echo, Color Doppler and Cardiac Doppler Indications:    I48.91* Unspeicified atrial fibrillation  History:        Patient has no prior history of Echocardiogram examinations.                 Abnormal ECG, Arrythmias:Atrial Fibrillation,                 Signs/Symptoms:Dyspnea and Shortness of Breath; Risk                 Factors:Current Smoker and Hypertension. Shock. Substance abuse.                 Hypoxia.  Sonographer:    Sheralyn Boatman RDCS Referring Phys: [id number] Cristopher Peru  Sonographer Comments: Technically difficult study due to poor echo windows. Image acquisition challenging due to uncooperative patient. Patient moving throughout exam, had to constantly re-direct. IMPRESSIONS  1. Left ventricular ejection fraction, by estimation, is 60 to 65%. The left ventricle has normal function. The left ventricle has no regional wall motion abnormalities. Left ventricular diastolic parameters are consistent with Grade I diastolic dysfunction (impaired relaxation).  Discharge Exam: Filed Weights   02/08/23 0500 02/09/23 0500 02/10/23 0703  Weight: 71 kg 73 kg 71.5 kg    S: Seen this morning, eating breakfast, really wants to go home today, no fevers or chills, chest pain.  Shortness of breath is improving.  Physical Exam General: Alert and oriented x 3, NAD Cardiovascular: S1 S2 clear, RRR.  Respiratory: CTAB, no wheezing, rales Gastrointestinal: Soft, nontender, nondistended, NBS Ext: no pedal edema bilaterally Neuro: no new deficits Skin: No rashes Psych: Normal affect and demeanor, alert and oriented x3    Condition at discharge: fair  The results of significant diagnostics from this hospitalization (including imaging, microbiology, ancillary and laboratory) are listed below for reference.   Imaging Studies: DG Chest Port 1 View  Result Date: 02/04/2023 CLINICAL DATA:  Provided history: Pneumonia. EXAM: PORTABLE CHEST 1 VIEW COMPARISON:  Chest radiographs 02/03/2023 and earlier FINDINGS: Right IJ approach central venous catheter with tip at the level of the lower SVC. The cardiomediastinal silhouette is unchanged. Aortic atherosclerosis. Prominence of the interstitial lung markings. Superimposed ill-defined opacities within the right lung base, progressed from the prior chest radiograph of 02/03/2023. No evidence of pleural effusion or pneumothorax. Thoracic spondylosis. IMPRESSION: 1. Persistent prominence of the interstitial lung markings, which may reflect interstitial edema or atypical  infection. 2. Superimposed ill-defined opacities within the right lung base, slightly progressed. This may reflect atelectasis and/or pneumonia. 3. Aortic Atherosclerosis (ICD10-I70.0). Electronically Signed   By: Jackey Loge D.O.   On: 02/04/2023 10:22   ECHOCARDIOGRAM COMPLETE  Result Date: 02/04/2023    ECHOCARDIOGRAM REPORT   [name] Date of Exam: 02/04/2023 Medical Rec #:  [id number]      Height:       64.0 in Accession #:    [id number]     Weight:       171.7 lb Date of Birth:  [birth year]      BSA:          1.834 m Patient Age:    76 years       BP:           107/67 mmHg Patient Gender: F              HR:           85 bpm. Exam Location:  Inpatient Procedure: 2D Echo, Color Doppler and Cardiac Doppler Indications:    I48.91* Unspeicified atrial fibrillation  History:        Patient has no prior history of Echocardiogram examinations.                 Abnormal ECG, Arrythmias:Atrial Fibrillation,                 Signs/Symptoms:Dyspnea and Shortness of Breath; Risk                 Factors:Current Smoker and Hypertension. Shock. Substance abuse.                 Hypoxia.  Sonographer:    Sheralyn Boatman RDCS Referring Phys: [id number] Cristopher Peru  Sonographer Comments: Technically difficult study due to poor echo windows. Image acquisition challenging due to uncooperative patient. Patient moving throughout exam, had to constantly re-direct. IMPRESSIONS  1. Left ventricular ejection fraction, by estimation, is 60 to 65%. The left ventricle has normal function. The left ventricle has no regional wall motion abnormalities. Left ventricular diastolic parameters are consistent with Grade I diastolic dysfunction (impaired relaxation).  Physician Discharge Summary   Patient: Denise Underwood MRN: [id number] DOB: 03-27-1946  Admit date:     02/02/2023  Discharge date: 02/11/23  Discharge Physician: Thad Ranger, MD    PCP: Sonny Masters, FNP   Recommendations at discharge:   Started on Lasix 20 mg daily, potassium 40 mEq daily Started on Cardizem 180 mg daily, metoprolol 25 mg twice daily Continue prednisone with taper, 40 mg for 2 days, 30 mg for 3 days, 20 mg for 3 days, 10 mg for 3 days then off Continue albuterol inhaler as needed for shortness of breath, wheezing, started on maintenance Wixela 250-50 one puff twice daily.   Outpatient pulm and cardiology follow-ups as scheduled  O2 saturation qualifications Patient Saturations on Room Air at Rest = 84% Patient Saturations on Room Air while Ambulating = 81% Patient Saturations on 3 Liters of oxygen while Ambulating = 92%  Discharge Diagnoses:    Septic shock (HCC)   Acute respiratory failure with hypoxia and hypercapnia (HCC)   New onset atrial fibrillation (HCC) Elevated troponins   Pressure injury of skin   AKI (acute kidney injury) (HCC)   Atrial fibrillation with RVR (HCC)   Shock liver Hypokalemia   Acute on chronic diastolic heart failure (HCC)   COPD with acute exacerbation (HCC)   Acute metabolic encephalopathy-resolved   Community acquired pneumonia    Hospital Course:  Patient is a 77 year old female with HTN, HLP, prediabetes, arthritis, COPD, tobacco use presented to [name] ED on 02/02/2023 with shortness of breath.  Patient had noted 1 to 2 days of worsening shortness of breath, was found hypoxic with O2 sat 76% by EMS, dyspneic, tachypneic and unable to even stand at home. In ED, was found to have new atrial fibrillation with RVR, was placed on O2, Cardizem drip was started, patient converted to NSR. Subsequently noted to be hypotensive, received IV fluids and eventually required vasopressor support.  Patient was transferred and by PCCM  to ICU on 02/03/2023   CTAP showed right lower lung infiltrates, small right pleural effusion.  ABG showed pH 7.15, pCO2 96, pO2 161, was placed on BiPAP and IV antibiotics. WBCs 34.5, creatinine 2.21, AST 760, ALT 445, troponin 84> 117.  BNP 697.    Assessment and Plan:   Septic shock (HCC), community-acquired pneumonia -CT abdomen pelvis showed right lower lung infiltrates, UA negative for UTI, no intra-abdominal pathology. -Chest x-ray 02/04/2023 with worsening RLL infiltrates -Off vasopressors, blood cultures 9/22 negative so far -Flu, RSV, COVID-negative, respiratory virus panel negative, urine strept antigen negative, Legionella antigen negative -Completed IV ceftriaxone and Zithromax x 5 days -Sepsis physiology resolved       Acute respiratory failure with hypoxia and hypercapnia (HCC) Community-acquired pneumonia, underlying COPD, tobacco use, RLL pleural effusion -Not on O2 at baseline -Pulmonology following, continue Pulmicort, Brovana, Yupelri, Xopenex, flutter valve -Currently on 2 L O2 via nasal cannula, wean as tolerated, will need home O2 evaluation -IV steroid weaned off, pulmonology recommending prednisone taper  - may need nocturnal BiPAP for home however she is noncompliant with BiPAP here inpatient, refusing.  So unlikely she will use it at home. -Outpatient pulmonology appointment scheduled with Dr. Sherene Sires -Home O2 evaluation completed, needs 3 L O2      New onset atrial fibrillation with RVR (HCC), elevated troponins Volume overload with acute diastolic CHF -Elevated troponins likely due to demand ischemia from respiratory failure, septic shock -2D echo showed EF 60 to 65%, G1 DD, normal right ventricular systolic function -  Discharge Exam: Filed Weights   02/08/23 0500 02/09/23 0500 02/10/23 0703  Weight: 71 kg 73 kg 71.5 kg    S: Seen this morning, eating breakfast, really wants to go home today, no fevers or chills, chest pain.  Shortness of breath is improving.  Physical Exam General: Alert and oriented x 3, NAD Cardiovascular: S1 S2 clear, RRR.  Respiratory: CTAB, no wheezing, rales Gastrointestinal: Soft, nontender, nondistended, NBS Ext: no pedal edema bilaterally Neuro: no new deficits Skin: No rashes Psych: Normal affect and demeanor, alert and oriented x3    Condition at discharge: fair  The results of significant diagnostics from this hospitalization (including imaging, microbiology, ancillary and laboratory) are listed below for reference.   Imaging Studies: DG Chest Port 1 View  Result Date: 02/04/2023 CLINICAL DATA:  Provided history: Pneumonia. EXAM: PORTABLE CHEST 1 VIEW COMPARISON:  Chest radiographs 02/03/2023 and earlier FINDINGS: Right IJ approach central venous catheter with tip at the level of the lower SVC. The cardiomediastinal silhouette is unchanged. Aortic atherosclerosis. Prominence of the interstitial lung markings. Superimposed ill-defined opacities within the right lung base, progressed from the prior chest radiograph of 02/03/2023. No evidence of pleural effusion or pneumothorax. Thoracic spondylosis. IMPRESSION: 1. Persistent prominence of the interstitial lung markings, which may reflect interstitial edema or atypical  infection. 2. Superimposed ill-defined opacities within the right lung base, slightly progressed. This may reflect atelectasis and/or pneumonia. 3. Aortic Atherosclerosis (ICD10-I70.0). Electronically Signed   By: Jackey Loge D.O.   On: 02/04/2023 10:22   ECHOCARDIOGRAM COMPLETE  Result Date: 02/04/2023    ECHOCARDIOGRAM REPORT   [name] Date of Exam: 02/04/2023 Medical Rec #:  [id number]      Height:       64.0 in Accession #:    [id number]     Weight:       171.7 lb Date of Birth:  [birth year]      BSA:          1.834 m Patient Age:    76 years       BP:           107/67 mmHg Patient Gender: F              HR:           85 bpm. Exam Location:  Inpatient Procedure: 2D Echo, Color Doppler and Cardiac Doppler Indications:    I48.91* Unspeicified atrial fibrillation  History:        Patient has no prior history of Echocardiogram examinations.                 Abnormal ECG, Arrythmias:Atrial Fibrillation,                 Signs/Symptoms:Dyspnea and Shortness of Breath; Risk                 Factors:Current Smoker and Hypertension. Shock. Substance abuse.                 Hypoxia.  Sonographer:    Sheralyn Boatman RDCS Referring Phys: [id number] Cristopher Peru  Sonographer Comments: Technically difficult study due to poor echo windows. Image acquisition challenging due to uncooperative patient. Patient moving throughout exam, had to constantly re-direct. IMPRESSIONS  1. Left ventricular ejection fraction, by estimation, is 60 to 65%. The left ventricle has normal function. The left ventricle has no regional wall motion abnormalities. Left ventricular diastolic parameters are consistent with Grade I diastolic dysfunction (impaired relaxation).

## 2023-02-11 NOTE — Progress Notes (Signed)
Progress Note  Patient Name: Denise Underwood Date of Encounter: 02/11/2023  Primary Cardiologist:   Little Ishikawa, MD   Subjective   She feels OK and is hoping to go home apparently with oxygen.  Denies palpitations  Inpatient Medications    Scheduled Meds:  apixaban  5 mg Oral BID   arformoterol  15 mcg Nebulization BID   budesonide (PULMICORT) nebulizer solution  0.5 mg Nebulization BID   Chlorhexidine Gluconate Cloth  6 each Topical Daily   diltiazem  240 mg Oral Daily   furosemide  20 mg Oral Daily   metoprolol tartrate  25 mg Oral BID   nicotine  21 mg Transdermal Daily   nystatin   Topical BID   mouth rinse  15 mL Mouth Rinse 4 times per day   pantoprazole  40 mg Oral Daily   revefenacin  175 mcg Nebulization Daily   traMADol  50 mg Oral Daily   traZODone  50 mg Oral QHS   Continuous Infusions:  sodium chloride Stopped (02/04/23 0648)   PRN Meds: docusate sodium, levalbuterol, LORazepam, mouth rinse, polyethylene glycol, polyvinyl alcohol, traMADol   Vital Signs    Vitals:   02/10/23 2106 02/10/23 2204 02/11/23 0426 02/11/23 0753  BP:  127/66 123/66   Pulse: 65 71 63   Resp:   17   Temp:   97.8 F (36.6 C)   TempSrc:   Oral   SpO2: 92% 93% 96% 97%  Weight:      Height:        Intake/Output Summary (Last 24 hours) at 02/11/2023 1041 Last data filed at 02/11/2023 0836 Gross per 24 hour  Intake 240 ml  Output 800 ml  Net -560 ml   Filed Weights   02/08/23 0500 02/09/23 0500 02/10/23 0703  Weight: 71 kg 73 kg 71.5 kg    Telemetry    NSR - Personally Reviewed  ECG    NA - Personally Reviewed  Physical Exam  GEN: No  acute distress.   Neck: No  JVD Cardiac: RRR, no murmurs, rubs, or gallops.  Respiratory: Clear   to auscultation bilaterally. GI: Soft, nontender, non-distended, normal bowel sounds  MS:  No edema; No deformity. Neuro:   Nonfocal  Psych: Oriented and appropriate   Labs    Chemistry Recent Labs  Lab  02/06/23 0509 02/07/23 0554 02/08/23 0654 02/09/23 0337 02/10/23 0327  NA 139 141 141 142 140  K 3.7 3.0* 2.8* 3.4* 3.1*  CL 97* 91* 85* 93* 94*  CO2 32 40* 41* 37* 35*  GLUCOSE 122* 153* 129* 110* 106*  BUN 53* 46* 42* 38* 39*  CREATININE 1.54* 1.20* 1.07* 1.07* 1.13*  CALCIUM 9.6 9.4 9.6 9.3 9.1  PROT 6.5 6.2* 6.2*  --   --   ALBUMIN 2.5* 2.4* 2.7*  --   --   AST 108* 52* 38  --   --   ALT 198* 139* 101*  --   --   ALKPHOS 122 105 97  --   --   BILITOT 0.9 0.8 1.3*  --   --   GFRNONAA 35* 47* 53* 53* 50*  ANIONGAP 10 10 15 12 11      Hematology Recent Labs  Lab 02/07/23 0605 02/08/23 0654 02/09/23 0337  WBC 31.8* 33.3* 27.8*  RBC 3.69* 3.86* 3.79*  HGB 12.3 13.0 12.8  HCT 38.4 40.4 40.5  MCV 104.1* 104.7* 106.9*  MCH 33.3 33.7 33.8  MCHC 32.0 32.2 31.6  RDW 13.2 13.4 13.5  PLT 185 191 176    Cardiac EnzymesNo results for input(s): "TROPONINI" in the last 168 hours. No results for input(s): "TROPIPOC" in the last 168 hours.   BNPNo results for input(s): "BNP", "PROBNP" in the last 168 hours.   DDimer No results for input(s): "DDIMER" in the last 168 hours.   Radiology    No results found.  Cardiac Studies   ECHO:   1. Left ventricular ejection fraction, by estimation, is 60 to 65%. The  left ventricle has normal function. The left ventricle has no regional  wall motion abnormalities. Left ventricular diastolic parameters are  consistent with Grade I diastolic  dysfunction (impaired relaxation).   2. Right ventricular systolic function is normal. The right ventricular  size is mildly enlarged. There is normal pulmonary artery systolic  pressure.   3. Left atrial size was mildly dilated.   4. Right atrial size was mildly dilated.   5. The mitral valve is degenerative. No evidence of mitral valve  regurgitation. No evidence of mitral stenosis.   6. The aortic valve is calcified. Aortic valve regurgitation is not  visualized. Mild aortic valve stenosis.  Aortic valve mean gradient  measures 14.0 mmHg. Aortic valve Vmax measures 2.48 m/s.   7. The inferior vena cava is dilated in size with <50% respiratory  variability, suggesting right atrial pressure of 15 mmHg.    Patient Profile     77 y.o. female with a hx of HTN, HLD, prediabetes, arthritis, COPD, tobacco use who is being seen 02/05/2023 for the evaluation of atrial fibrillation   Assessment & Plan    PAF:   Now in NSR.  Tolerating Eliquis.  On Cardizem CD.  BP was a little low yesterday and HR mildly reduced.  She likely will need a little less Cardizem and I will change ot 180 mg PO daily.    She might not need Eliquis long term but I did discuss this with her    AS: Mild.  We will follow this clinically.    Elevated trop:     Suspect demand ischemia.  No further work up.     Acute on chronic diastolic dysfunction:    On PO Lasix.  Can go home on this dose of Lasix and on Kdur 40 meq.   We will arrange follow up in a couple of weeks in our office.    Hypokalemia:  Will order supplement for today.     For questions or updates, please contact [facility] Please consult [email or web address] for contact info under Cardiology/STEMI.   Signed, Rollene Rotunda, MD  02/11/2023, 10:41 AM

## 2023-02-11 NOTE — Progress Notes (Signed)
SATURATION QUALIFICATIONS: (This note is used to comply with regulatory documentation for home oxygen)  Patient Saturations on Room Air at Rest = 84%  Patient Saturations on Room Air while Ambulating = 81%  Patient Saturations on 3 Liters of oxygen while Ambulating = 92%  Please briefly explain why patient needs home oxygen: Pts O2 dropped below 88% while on room air.

## 2023-02-11 NOTE — TOC Progression Note (Signed)
Transition of Care [facility]) - Progression Note    Patient Details  Name: Denise Underwood MRN: [id number] Date of Birth: Apr 16, 1946  Transition of Care [facility]) CM/SW Contact  Howell Rucks, RN Phone Number: 02/11/2023, 11:01 AM  Clinical Narrative:   New order for Home 02, [employer], rep-[employer], to deliver to bedside. TOC will continue to follow.          Expected Discharge Plan and Services                                               Social Determinants of Health (SDOH) Interventions SDOH Screenings   Food Insecurity: No Food Insecurity (02/04/2023)  Housing: Low Risk  (02/04/2023)  Transportation Needs: No Transportation Needs (02/04/2023)  Utilities: Not At Risk (02/04/2023)  Alcohol Screen: Low Risk  (11/07/2022)  Depression (PHQ2-9): Medium Risk (11/19/2022)  Financial Resource Strain: Low Risk  (11/07/2022)  Physical Activity: Insufficiently Active (11/07/2022)  Social Connections: Socially Isolated (11/07/2022)  Stress: No Stress Concern Present (11/07/2022)  Tobacco Use: High Risk (02/02/2023)    Readmission Risk Interventions    02/04/2023    5:21 PM  Readmission Risk Prevention Plan  Transportation Screening Complete  PCP or Specialist Appt within 5-7 Days Complete  Home Care Screening Complete  Medication Review (RN CM) Complete

## 2023-02-11 NOTE — Plan of Care (Signed)
  Problem: Safety: Goal: Ability to remain free from injury will improve Outcome: Progressing   Problem: Skin Integrity: Goal: Risk for impaired skin integrity will decrease Outcome: Progressing   

## 2023-02-12 ENCOUNTER — Telehealth: Payer: Self-pay

## 2023-02-12 DIAGNOSIS — G9341 Metabolic encephalopathy: Secondary | ICD-10-CM | POA: Diagnosis not present

## 2023-02-12 DIAGNOSIS — I4891 Unspecified atrial fibrillation: Secondary | ICD-10-CM | POA: Diagnosis not present

## 2023-02-12 DIAGNOSIS — J441 Chronic obstructive pulmonary disease with (acute) exacerbation: Secondary | ICD-10-CM | POA: Diagnosis not present

## 2023-02-12 DIAGNOSIS — I5033 Acute on chronic diastolic (congestive) heart failure: Secondary | ICD-10-CM | POA: Diagnosis not present

## 2023-02-12 NOTE — Transitions of Care (Post Inpatient/ED Visit) (Signed)
02/12/2023  Name: Denise Underwood MRN: [id number] DOB: 12/26/1945  Today's TOC FU Call Status: Today's TOC FU Call Status:: Successful TOC FU Call Completed TOC FU Call Complete Date: 02/12/23 Patient's Name and Date of Birth confirmed.  Transition Care Management Follow-up Telephone Call Date of Discharge: 02/11/23 Discharge Facility: [facility]) Type of Discharge: Inpatient Admission Primary Inpatient Discharge Diagnosis:: acute respiratory failure How have you been since you were released from the hospital?: Better Any questions or concerns?: No  Items Reviewed: Did you receive and understand the discharge instructions provided?: No Medications obtained,verified, and reconciled?: Yes (Medications Reviewed) Any new allergies since your discharge?: No Dietary orders reviewed?: Yes Do you have support at home?: Yes People in Home: child(ren), adult  Medications Reviewed Today: Medications Reviewed Today     Reviewed by Karena Addison, LPN (Licensed Practical Nurse) on 02/12/23 at 1302  Med List Status: <None>   Medication Order Taking? Sig Documenting Provider Last Dose Status Informant  albuterol (VENTOLIN HFA) 108 (90 Base) MCG/ACT inhaler [id number] No Inhale 2 puffs into the lungs every 6 (six) hours as needed. Gwenlyn Fudge, FNP Taking Active Child, Pharmacy Records  apixaban (ELIQUIS) 5 MG TABS tablet [id number]  Take 1 tablet (5 mg total) by mouth 2 (two) times daily. Cathren Harsh, MD  Active   CALCIUM PO [id number] No Take 1 tablet by mouth daily. [provider] Past Week Active Child, Pharmacy Records  cetirizine (ZYRTEC) 10 MG tablet [id number] No Take 10 mg by mouth daily. [provider] Past Week Active Child, Pharmacy Records  Cholecalciferol (VITAMIN D3) 1000 units CAPS [id number] No Take 2,000 Units by mouth. [provider] Past Week Active Child, Pharmacy Records           Med Note Jola Schmidt   Tue Feb 04, 2023  9:56  AM)    diltiazem (CARDIZEM CD) 180 MG 24 hr capsule [id number]  Take 1 capsule (180 mg total) by mouth daily. Rai, Delene Ruffini, MD  Active   Emollient [facility] DIABETICS DRY SKIN) CREA [id number] No Apply 1 Application topically 2 (two) times daily. Sonny Masters, FNP Past Week Active Child, Pharmacy Records  escitalopram (LEXAPRO) 20 MG tablet [id number]  take 1 tablet once daily. Sonny Masters, FNP  Active   fluticasone [facility]) 50 MCG/ACT nasal spray [id number] No Place 2 sprays into both nostrils daily. Sonny Masters, FNP Past Week Active Child, Pharmacy Records  fluticasone-salmeterol [facility]UB) 250-50 MCG/ACT AEPB [id number]  Inhale 1 puff into the lungs in the morning and at bedtime. Rai, Delene Ruffini, MD  Active   furosemide (LASIX) 20 MG tablet [id number]  Take 1 tablet (20 mg total) by mouth daily. Rai, Delene Ruffini, MD  Active   metoprolol tartrate (LOPRESSOR) 25 MG tablet [id number]  Take 1 tablet (25 mg total) by mouth 2 (two) times daily. Rai, Delene Ruffini, MD  Active   naloxone [facility]) nasal spray 4 mg/0.1 mL [id number] No As needed or respiratory depression or decreased mental status related to narcotic use / overdose [name], [name], FNP Taking Active Child, Pharmacy Records  nicotine (NICODERM CQ - DOSED IN MG/24 HOURS) 21 mg/24hr patch [id number]  Wear for 24 hours. If you have sleep disturbances, remove at bedtime. Cathren Harsh, MD  Active   nystatin (MYCOSTATIN/NYSTOP) powder [id number] No Apply 1 application. topically 3 (three) times daily. Gwenlyn Fudge, FNP Unk Active Child, Pharmacy Records  pantoprazole (PROTONIX) 40 MG tablet [id number]  Take  1 tablet (40 mg total) by mouth daily. Rai, Delene Ruffini, MD  Active   potassium chloride SA (KLOR-CON M) 20 MEQ tablet [id number]  Take 2 tablets (40 mEq total) by mouth daily. Rai, Delene Ruffini, MD  Active   predniSONE (DELTASONE) 10 MG tablet [id number]  Prednisone dosing: Take  Prednisone 40mg  (4 tabs) x 2 days, then taper to 30mg  (3 tabs) x 3  days, then 20mg  (2 tabs) x 3days, then 10mg  (1 tab) x 3days, then OFF. Dispense:  30 tabs, refills: None Rai, Ripudeep K, MD  Active   traMADol (ULTRAM) 50 MG tablet [id number] No Take 2 tablets (100 mg total) by mouth 2 (two) times daily. Sonny Masters, FNP Past Week Active Child, Pharmacy Records  traZODone (DESYREL) 50 MG tablet [id number] No TAKE 1 TABLET BY MOUTH AT BEDTIME. Sonny Masters, FNP Past Week Active Child, Pharmacy Records            Home Care and Equipment/Supplies: Were Home Health Services Ordered?: NA Any new equipment or medical supplies ordered?: NA  Functional Questionnaire: Do you need assistance with bathing/showering or dressing?: No Do you need assistance with meal preparation?: No Do you need assistance with eating?: No Do you have difficulty maintaining continence: No Do you need assistance with getting out of bed/getting out of a chair/moving?: No Do you have difficulty managing or taking your medications?: No  Follow up appointments reviewed: PCP Follow-up appointment confirmed?: Yes Date of PCP follow-up appointment?: 02/18/23 Follow-up Provider: [facility] Follow-up appointment confirmed?: NA Do you need transportation to your follow-up appointment?: No Do you understand care options if your condition(s) worsen?: Yes-patient verbalized understanding    SIGNATURE Karena Addison, LPN [facility] Nurse Health Advisor Direct Dial [phone]

## 2023-02-17 ENCOUNTER — Telehealth: Payer: Self-pay

## 2023-02-17 NOTE — Transitions of Care (Post Inpatient/ED Visit) (Signed)
02/17/2023  Name: Denise Underwood MRN: [id number] DOB: August 11, 1945  Today's TOC FU Call Status: Today's TOC FU Call Status:: Unsuccessful Call (1st Attempt) Unsuccessful Call (1st Attempt) Date: 02/17/23  Attempted to reach the patient regarding the most recent Inpatient/ED visit and to enroll in 30 day post hospital program.  Follow Up Plan: Additional outreach attempts will be made to reach the patient to complete the Transitions of Care (Post Inpatient/ED visit) call.   Jodelle Gross RN, BSN, CCM RN Care Manager  Transitions of Care  [facility]  [phone]

## 2023-02-18 ENCOUNTER — Inpatient Hospital Stay: Payer: Medicare HMO | Admitting: Family Medicine

## 2023-02-18 ENCOUNTER — Telehealth: Payer: Self-pay

## 2023-02-18 NOTE — Transitions of Care (Post Inpatient/ED Visit) (Signed)
02/18/2023  Name: Denise Underwood MRN: [id number] DOB: 03/10/1946  Today's TOC FU Call Status: Today's TOC FU Call Status:: Successful TOC FU Call Completed TOC FU Call Complete Date: 02/18/23 Patient's Name and Date of Birth confirmed.  Transition Care Management Follow-up Telephone Call Date of Discharge: 02/11/23 Discharge Facility: [facility]) Type of Discharge: Inpatient Admission Primary Inpatient Discharge Diagnosis:: Acute Respiratory Failure How have you been since you were released from the hospital?: Better (Per patient, she is doing very well, feels great.) Any questions or concerns?: No  Items Reviewed: Did you receive and understand the discharge instructions provided?: Yes Medications obtained,verified, and reconciled?: Yes (Medications Reviewed) Any new allergies since your discharge?: No Dietary orders reviewed?: Yes Type of Diet Ordered:: Low sodium, heart healthy Do you have support at home?: Yes People in Home: child(ren), adult Name of Support/Comfort Primary Source: [name]  Medications Reviewed Today: Medications Reviewed Today     Reviewed by Jodelle Gross, RN (Case Manager) on 02/18/23 at 1310  Med List Status: <None>   Medication Order Taking? Sig Documenting Provider Last Dose Status Informant  albuterol (VENTOLIN HFA) 108 (90 Base) MCG/ACT inhaler [id number] Yes Inhale 2 puffs into the lungs every 6 (six) hours as needed. Gwenlyn Fudge, FNP Taking Active Child, Pharmacy Records  apixaban [facility]) 5 MG TABS tablet [id number] Yes Take 1 tablet (5 mg total) by mouth 2 (two) times daily. Cathren Harsh, MD Taking Active   CALCIUM PO [id number] Yes Take 1 tablet by mouth daily. [provider] Taking Active Child, Pharmacy Records  cetirizine (ZYRTEC) 10 MG tablet [id number] Yes Take 10 mg by mouth daily. [provider] Taking Active Child, Pharmacy Records  Cholecalciferol (VITAMIN D3) 1000 units CAPS [id number] Yes Take 2,000  Units by mouth. [provider] Taking Active Child, Pharmacy Records           Med Note Jola Schmidt   Tue Feb 04, 2023  9:56 AM)    diltiazem (CARDIZEM CD) 180 MG 24 hr capsule [id number] Yes Take 1 capsule (180 mg total) by mouth daily. Rai, Delene Ruffini, MD Taking Active   Emollient [facility] DRY SKIN) CREA [id number] Yes Apply 1 Application topically 2 (two) times daily. Sonny Masters, FNP Taking Active Child, Pharmacy Records  escitalopram (LEXAPRO) 20 MG tablet [id number] Yes take 1 tablet once daily. Sonny Masters, FNP Taking Active   fluticasone [facility]) 50 MCG/ACT nasal spray [id number] Yes Place 2 sprays into both nostrils daily. Sonny Masters, FNP Taking Active Child, Pharmacy Records  fluticasone-salmeterol [facility]) 250-50 MCG/ACT AEPB [id number] Yes Inhale 1 puff into the lungs in the morning and at bedtime. Rai, Delene Ruffini, MD Taking Active   furosemide (LASIX) 20 MG tablet [id number] Yes Take 1 tablet (20 mg total) by mouth daily. Rai, Delene Ruffini, MD Taking Active   metoprolol tartrate (LOPRESSOR) 25 MG tablet [id number] Yes Take 1 tablet (25 mg total) by mouth 2 (two) times daily. Cathren Harsh, MD Taking Active   naloxone [facility]) nasal spray 4 mg/0.1 mL [id number] Yes As needed or respiratory depression or decreased mental status related to narcotic use / overdose [name], [name], FNP Taking Active Child, Pharmacy Records  nicotine (NICODERM CQ - DOSED IN MG/24 HOURS) 21 mg/24hr patch [id number] Yes Wear for 24 hours. If you have sleep disturbances, remove at bedtime. Cathren Harsh, MD Taking Active   nystatin (MYCOSTATIN/NYSTOP) powder [id number] Yes Apply 1 application. topically 3 (three) times daily. Alona Bene,  Robin Searing, FNP Taking Active Child, Pharmacy Records  pantoprazole (PROTONIX) 40 MG tablet [id number] Yes Take 1 tablet (40 mg total) by mouth daily. Rai, Delene Ruffini, MD Taking Active   potassium chloride SA (KLOR-CON M) 20 MEQ tablet [id number] Yes Take  2 tablets (40 mEq total) by mouth daily. Rai, Delene Ruffini, MD Taking Active   predniSONE (DELTASONE) 10 MG tablet [id number] Yes Prednisone dosing: Take  Prednisone 40mg  (4 tabs) x 2 days, then taper to 30mg  (3 tabs) x 3 days, then 20mg  (2 tabs) x 3days, then 10mg  (1 tab) x 3days, then OFF. Dispense:  30 tabs, refills: None Rai, Ripudeep K, MD Taking Active   traMADol (ULTRAM) 50 MG tablet [id number] Yes Take 2 tablets (100 mg total) by mouth 2 (two) times daily. Sonny Masters, FNP Taking Active Child, Pharmacy Records  traZODone (DESYREL) 50 MG tablet [id number] Yes TAKE 1 TABLET BY MOUTH AT BEDTIME. Sonny Masters, FNP Taking Active Child, Pharmacy Records            Home Care and Equipment/Supplies: Were Home Health Services Ordered?: Yes Name of Home Health Agency:: [facility] Has Agency set up a time to come to your home?: Yes First Home Health Visit Date: 02/14/23 Any new equipment or medical supplies ordered?: Yes Name of Medical supply agency?: [employer] [employer] Were you able to get the equipment/medical supplies?: Yes Do you have any questions related to the use of the equipment/supplies?: No  Functional Questionnaire: Do you need assistance with bathing/showering or dressing?: Yes Do you need assistance with meal preparation?: Yes Do you need assistance with eating?: Yes Do you have difficulty maintaining continence: Yes Do you need assistance with getting out of bed/getting out of a chair/moving?: Yes Do you have difficulty managing or taking your medications?: Yes  Follow up appointments reviewed: PCP Follow-up appointment confirmed?: Yes Date of PCP follow-up appointment?: 02/19/23 Follow-up Provider: Luisa Hart Follow-up appointment confirmed?: Yes Date of Specialist follow-up appointment?: 02/25/23 Follow-Up Specialty Provider:: Bernadene Person Do you need transportation to your follow-up appointment?: No Do you understand care options if your condition(s)  worsen?: Yes-patient verbalized understanding  SDOH Interventions Today    Flowsheet Row Most Recent Value  SDOH Interventions   Food Insecurity Interventions Intervention Not Indicated  Housing Interventions Intervention Not Indicated  Transportation Interventions Intervention Not Indicated  Utilities Interventions Intervention Not Indicated     Patient denied the need for 30 day TOC program.  Jodelle Gross RN, BSN, CCM RN Care Manager  Transitions of Care  [facility]  [phone]

## 2023-02-19 ENCOUNTER — Encounter: Payer: Self-pay | Admitting: Family Medicine

## 2023-02-19 ENCOUNTER — Ambulatory Visit: Payer: Medicare HMO

## 2023-02-19 VITALS — BP 112/64 | HR 82 | Temp 96.4°F | Ht 64.0 in | Wt 148.4 lb

## 2023-02-19 DIAGNOSIS — R6521 Severe sepsis with septic shock: Secondary | ICD-10-CM

## 2023-02-19 DIAGNOSIS — A419 Sepsis, unspecified organism: Secondary | ICD-10-CM

## 2023-02-19 DIAGNOSIS — J441 Chronic obstructive pulmonary disease with (acute) exacerbation: Secondary | ICD-10-CM

## 2023-02-19 DIAGNOSIS — Z09 Encounter for follow-up examination after completed treatment for conditions other than malignant neoplasm: Secondary | ICD-10-CM

## 2023-02-19 DIAGNOSIS — L409 Psoriasis, unspecified: Secondary | ICD-10-CM

## 2023-02-19 DIAGNOSIS — I4891 Unspecified atrial fibrillation: Secondary | ICD-10-CM | POA: Diagnosis not present

## 2023-02-19 DIAGNOSIS — J9602 Acute respiratory failure with hypercapnia: Secondary | ICD-10-CM | POA: Diagnosis not present

## 2023-02-19 DIAGNOSIS — J9601 Acute respiratory failure with hypoxia: Secondary | ICD-10-CM

## 2023-02-19 DIAGNOSIS — I5033 Acute on chronic diastolic (congestive) heart failure: Secondary | ICD-10-CM

## 2023-02-19 DIAGNOSIS — G9341 Metabolic encephalopathy: Secondary | ICD-10-CM | POA: Diagnosis not present

## 2023-02-19 DIAGNOSIS — N179 Acute kidney failure, unspecified: Secondary | ICD-10-CM | POA: Diagnosis not present

## 2023-02-19 DIAGNOSIS — R195 Other fecal abnormalities: Secondary | ICD-10-CM | POA: Diagnosis not present

## 2023-02-19 DIAGNOSIS — K7200 Acute and subacute hepatic failure without coma: Secondary | ICD-10-CM

## 2023-02-19 MED ORDER — TRIAMCINOLONE ACETONIDE 0.025 % EX CREA
1.0000 | TOPICAL_CREAM | Freq: Two times a day (BID) | CUTANEOUS | 0 refills | Status: DC
Start: 2023-02-19 — End: 2023-10-14

## 2023-02-19 MED ORDER — TRIAMCINOLONE ACETONIDE 0.025 % EX CREA: 1.0000 | TOPICAL_CREAM | Freq: Two times a day (BID) | CUTANEOUS | 0 refills | Status: DC

## 2023-02-19 MED ORDER — POLYETHYLENE GLYCOL 3350 17 GM/SCOOP PO POWD
17.0000 g | Freq: Every day | ORAL | 1 refills | Status: DC
Start: 2023-02-19 — End: 2023-02-20

## 2023-02-19 NOTE — Progress Notes (Signed)
Subjective:  Patient ID: Denise Underwood, female    DOB: [birth year], 77 y.o.   MRN: [id number]  Patient Care Team: Sonny Masters, FNP as PCP - General (Family Medicine) Little Ishikawa, MD as PCP - Cardiology (Cardiology) Magrinat, Valentino Hue, MD (Inactive) as Consulting Physician (Oncology) Toma Deiters, MD (Internal Medicine) Michae Kava, MD as Consulting Physician (Internal Medicine)   Chief Complaint:  Hospitalization Follow-up (02/02/2023 - 02/11/2023 (9 days)/[facility]-  Septic shock)   HPI: Denise Underwood is a 77 y.o. female presenting on 02/19/2023 for Hospitalization Follow-up (02/02/2023 - 02/11/2023 (9 days)/[facility]-  Septic shock)   Patient presents today for hospital discharge follow up. She was admitted for acute respiratory failure with hypoxia and hypercapnia, septic shock, new onset A-Fib with RVR, acute on chronic diastolic heart failyre, COPD with acute exacerbation, AKI, liver shock, and acute metabolic encephalopathy. She presented to [name] ED on 02/02/2023 with shortness of breath.  Patient had noted 1 to 2 days of worsening shortness of breath, was found hypoxic with O2 sat 76% by EMS, dyspneic, tachypneic and unable to even stand at home. In ED, was found to have new atrial fibrillation with RVR, she was started on cardizem drip and converted to NSR. She as hypotensive, received IV fluids and eventually required vasopressor support and was admitted to ICU on 02/03/2023. She was discharged home 02/11/2023 on oxygen 3L via [place]. She has had home health since time of discharge. She has follow up with cardiology and pulmonology scheduled. She reports she has been doing well since being home. She is tired but feels this is improving daily. She is not wearing her oxygen today and denies shortness of breath. Oxygen saturation is normal.   Diagnostic studies during admission: CTAP showed right lower lung infiltrates, small right pleural  effusion.  ABG showed pH 7.15, pCO2 96, pO2 161, was placed on BiPAP and IV antibiotics. WBCs 34.5, creatinine 2.21, AST 760, ALT 445, troponin 84> 117.  BNP 697.    Relevant past medical, surgical, family, and social history reviewed and updated as indicated.  Allergies and medications reviewed and updated. Data reviewed: Chart in Epic.   Past Medical History:  Diagnosis Date   Anxiety    Arthritis    Hyperlipidemia    Hypertension    Osteopenia 07/04/2021   Prediabetes 07/09/2021   Psoriasis     Past Surgical History:  Procedure Laterality Date   ABDOMINAL HYSTERECTOMY     CHOLECYSTECTOMY     HERNIA REPAIR     KNEE SURGERY     TUBAL LIGATION      Social History   Socioeconomic History   Marital status: Single    Spouse name: Not on file   Number of children: 2   Years of education: Not on file   Highest education level: 9th grade  Occupational History   Occupation: Retired  Tobacco Use   Smoking status: Every Day    Current packs/day: 0.50    Average packs/day: 0.5 packs/day for 40.0 years (20.0 ttl pk-yrs)    Types: Cigarettes   Smokeless tobacco: Never   Tobacco comments:    Smoking since she was 29 or younger  Vaping Use   Vaping status: Never Used  Substance and Sexual Activity   Alcohol use: No   Drug use: No   Sexual activity: Not Currently    Birth control/protection: Surgical  Other Topics Concern   Not on file  Social History Narrative   She lives alone on ground level apartment - has children.    Daughter, [name] is her main caregiver and means for transportation.   She also has an aide that comes in for 2 hours on weekdays to help with bathing, meals, meds etc   Social Determinants of Health   Financial Resource Strain: Low Risk  (11/07/2022)   Overall Financial Resource Strain (CARDIA)    Difficulty of Paying Living Expenses: Not hard at all  Food Insecurity: No Food Insecurity (02/18/2023)   Hunger Vital Sign    Worried About Running  Out of Food in the Last Year: Never true    Ran Out of Food in the Last Year: Never true  Transportation Needs: No Transportation Needs (02/18/2023)   PRAPARE - Administrator, Civil Service (Medical): No    Lack of Transportation (Non-Medical): No  Physical Activity: Insufficiently Active (11/07/2022)   Exercise Vital Sign    Days of Exercise per Week: 3 days    Minutes of Exercise per Session: 30 min  Stress: No Stress Concern Present (11/07/2022)   [employer] of Occupational Health - Occupational Stress Questionnaire    Feeling of Stress : Not at all  Social Connections: Socially Isolated (11/07/2022)   Social Connection and Isolation Panel [NHANES]    Frequency of Communication with Friends and Family: More than three times a week    Frequency of Social Gatherings with Friends and Family: More than three times a week    Attends Religious Services: Never    Database administrator or Organizations: No    Attends Banker Meetings: Never    Marital Status: Widowed  Intimate Partner Violence: Not At Risk (02/04/2023)   Humiliation, Afraid, Rape, and Kick questionnaire    Fear of Current or Ex-Partner: No    Emotionally Abused: No    Physically Abused: No    Sexually Abused: No    Outpatient Encounter Medications as of 02/19/2023  Medication Sig   albuterol (VENTOLIN HFA) 108 (90 Base) MCG/ACT inhaler Inhale 2 puffs into the lungs every 6 (six) hours as needed.   apixaban (ELIQUIS) 5 MG TABS tablet Take 1 tablet (5 mg total) by mouth 2 (two) times daily.   CALCIUM PO Take 1 tablet by mouth daily.   cetirizine (ZYRTEC) 10 MG tablet Take 10 mg by mouth daily.   Cholecalciferol (VITAMIN D3) 1000 units CAPS Take 2,000 Units by mouth.   diltiazem (CARDIZEM CD) 180 MG 24 hr capsule Take 1 capsule (180 mg total) by mouth daily.   Emollient (CERAVE DIABETICS DRY SKIN) CREA Apply 1 Application topically 2 (two) times daily.   escitalopram (LEXAPRO) 20 MG tablet  take 1 tablet once daily.   fluticasone (FLONASE) 50 MCG/ACT nasal spray Place 2 sprays into both nostrils daily.   fluticasone-salmeterol (WIXELA INHUB) 250-50 MCG/ACT AEPB Inhale 1 puff into the lungs in the morning and at bedtime.   furosemide (LASIX) 20 MG tablet Take 1 tablet (20 mg total) by mouth daily.   metoprolol tartrate (LOPRESSOR) 25 MG tablet Take 1 tablet (25 mg total) by mouth 2 (two) times daily.   naloxone (NARCAN) nasal spray 4 mg/0.1 mL As needed or respiratory depression or decreased mental status related to narcotic use / overdose   nystatin (MYCOSTATIN/NYSTOP) powder Apply 1 application. topically 3 (three) times daily.   pantoprazole (PROTONIX) 40 MG tablet Take 1 tablet (40 mg total) by mouth daily.   polyethylene glycol  powder (GLYCOLAX/MIRALAX) 17 GM/SCOOP powder Take 17 g by mouth daily.   potassium chloride SA (KLOR-CON M) 20 MEQ tablet Take 2 tablets (40 mEq total) by mouth daily.   predniSONE (DELTASONE) 10 MG tablet Prednisone dosing: Take  Prednisone 40mg  (4 tabs) x 2 days, then taper to 30mg  (3 tabs) x 3 days, then 20mg  (2 tabs) x 3days, then 10mg  (1 tab) x 3days, then OFF. Dispense:  30 tabs, refills: None   traMADol (ULTRAM) 50 MG tablet Take 2 tablets (100 mg total) by mouth 2 (two) times daily.   traZODone (DESYREL) 50 MG tablet TAKE 1 TABLET BY MOUTH AT BEDTIME.   [DISCONTINUED] triamcinolone (KENALOG) 0.025 % cream Apply 1 Application topically 2 (two) times daily.   triamcinolone (KENALOG) 0.025 % cream Apply 1 Application topically 2 (two) times daily.   [DISCONTINUED] nicotine (NICODERM CQ - DOSED IN MG/24 HOURS) 21 mg/24hr patch Wear for 24 hours. If you have sleep disturbances, remove at bedtime.   [DISCONTINUED] triamcinolone (KENALOG) 0.025 % cream Apply 1 Application topically 2 (two) times daily.   No facility-administered encounter medications on file as of 02/19/2023.    No Known Allergies  Review of Systems  Constitutional:  Positive for  activity change, appetite change and fatigue. Negative for chills, diaphoresis, fever and unexpected weight change.  HENT: Negative.    Eyes: Negative.   Respiratory:  Negative for apnea, cough, chest tightness, shortness of breath, wheezing and stridor.   Cardiovascular:  Negative for chest pain, palpitations and leg swelling.  Gastrointestinal:  Positive for constipation. Negative for abdominal distention, abdominal pain, anal bleeding, blood in stool, diarrhea, nausea, rectal pain and vomiting.  Endocrine: Negative.   Genitourinary:  Negative for decreased urine volume, difficulty urinating, dysuria, frequency and urgency.  Musculoskeletal:  Positive for arthralgias, back pain, gait problem and joint swelling. Negative for myalgias.  Skin: Negative.   Allergic/Immunologic: Negative.   Neurological:  Negative for dizziness and headaches.  Hematological: Negative.   Psychiatric/Behavioral:  Negative for confusion, hallucinations, sleep disturbance and suicidal ideas.   All other systems reviewed and are negative.       Objective:  BP 112/64   Pulse 82   Temp (!) 96.4 F (35.8 C) (Temporal)   Ht 5\' 4"  (1.626 m)   Wt 148 lb 6.4 oz (67.3 kg)   SpO2 94%   BMI 25.47 kg/m    Wt Readings from Last 3 Encounters:  02/19/23 148 lb 6.4 oz (67.3 kg)  02/10/23 157 lb 10.1 oz (71.5 kg)  01/10/23 159 lb 3.2 oz (72.2 kg)    Physical Exam Vitals and nursing note reviewed.  Constitutional:      General: She is not in acute distress.    Appearance: She is well-developed and overweight. She is ill-appearing (chronically). She is not toxic-appearing or diaphoretic.  HENT:     Head: Normocephalic and atraumatic.     Nose: Nose normal.     Mouth/Throat:     Mouth: Mucous membranes are moist.  Eyes:     Conjunctiva/sclera: Conjunctivae normal.     Pupils: Pupils are equal, round, and reactive to light.  Cardiovascular:     Rate and Rhythm: Normal rate. Rhythm irregularly irregular.      Heart sounds: Normal heart sounds.  Pulmonary:     Effort: Pulmonary effort is normal.     Breath sounds: Normal breath sounds. No rhonchi.  Abdominal:     General: Bowel sounds are normal. There is no distension.  Palpations: Abdomen is soft.     Tenderness: There is no abdominal tenderness.  Musculoskeletal:     Cervical back: Neck supple.     Right lower leg: No edema.     Left lower leg: No edema.  Skin:    General: Skin is warm and dry.     Capillary Refill: Capillary refill takes less than 2 seconds.     Comments: Pressure ulcer to coccyx, dime size erythema, blanches with palpation  Neurological:     General: No focal deficit present.     Mental Status: She is alert and oriented to person, place, and time.     Gait: Gait abnormal (using rolling walker).  Psychiatric:        Mood and Affect: Mood normal.        Behavior: Behavior normal. Behavior is cooperative.        Thought Content: Thought content normal.        Judgment: Judgment normal.     Results for orders placed or performed during the hospital encounter of 02/02/23  Resp panel by RT-PCR (RSV, Flu A&B, Covid) Anterior Nasal Swab   Specimen: Anterior Nasal Swab  Result Value Ref Range   SARS Coronavirus 2 by RT PCR NEGATIVE NEGATIVE   Influenza A by PCR NEGATIVE NEGATIVE   Influenza B by PCR NEGATIVE NEGATIVE   Resp Syncytial Virus by PCR NEGATIVE NEGATIVE  Blood Culture (routine x 2)   Specimen: BLOOD RIGHT FOREARM  Result Value Ref Range   Specimen Description BLOOD RIGHT FOREARM BLOOD    Special Requests      BOTTLES DRAWN AEROBIC AND ANAEROBIC Blood Culture adequate volume   Culture      NO GROWTH 5 DAYS Performed at [facility], [address], [place], [place] [address]    Report Status 02/07/2023 FINAL   Blood Culture (routine x 2)   Specimen: Right Antecubital; Blood  Result Value Ref Range   Specimen Description RIGHT ANTECUBITAL BLOOD    Special Requests      BOTTLES DRAWN AEROBIC AND  ANAEROBIC Blood Culture adequate volume   Culture      NO GROWTH 5 DAYS Performed at [facility], [address] [place], [place] [address]    Report Status 02/07/2023 FINAL   Respiratory (~20 pathogens) panel by PCR   Specimen: Nasopharyngeal Swab; Respiratory  Result Value Ref Range   Adenovirus NOT DETECTED NOT DETECTED   Coronavirus 229E NOT DETECTED NOT DETECTED   Coronavirus HKU1 NOT DETECTED NOT DETECTED   Coronavirus NL63 NOT DETECTED NOT DETECTED   Coronavirus OC43 NOT DETECTED NOT DETECTED   Metapneumovirus NOT DETECTED NOT DETECTED   Rhinovirus / Enterovirus NOT DETECTED NOT DETECTED   Influenza A NOT DETECTED NOT DETECTED   Influenza B NOT DETECTED NOT DETECTED   Parainfluenza Virus 1 NOT DETECTED NOT DETECTED   Parainfluenza Virus 2 NOT DETECTED NOT DETECTED   Parainfluenza Virus 3 NOT DETECTED NOT DETECTED   Parainfluenza Virus 4 NOT DETECTED NOT DETECTED   Respiratory Syncytial Virus NOT DETECTED NOT DETECTED   Bordetella pertussis NOT DETECTED NOT DETECTED   Bordetella Parapertussis NOT DETECTED NOT DETECTED   Chlamydophila pneumoniae NOT DETECTED NOT DETECTED   Mycoplasma pneumoniae NOT DETECTED NOT DETECTED  MRSA Next Gen by PCR, Nasal  Result Value Ref Range   MRSA by PCR Next Gen NOT DETECTED NOT DETECTED  Lactic acid, plasma  Result Value Ref Range   Lactic Acid, Venous 1.8 0.5 - 1.9 mmol/L  Lactic acid, plasma  Result Value Ref Range   Lactic Acid, Venous 1.3 0.5 - 1.9 mmol/L  Comprehensive metabolic panel  Result Value Ref Range   Sodium 136 135 - 145 mmol/L   Potassium 4.9 3.5 - 5.1 mmol/L   Chloride 93 (L) 98 - 111 mmol/L   CO2 28 22 - 32 mmol/L   Glucose, Bld 189 (H) 70 - 99 mg/dL   BUN 46 (H) 8 - 23 mg/dL   Creatinine, Ser 4.09 (H) 0.44 - 1.00 mg/dL   Calcium 9.0 8.9 - 81.1 mg/dL   Total Protein 6.7 6.5 - 8.1 g/dL   Albumin 3.0 (L) 3.5 - 5.0 g/dL   AST 914 (H) 15 - 41 U/L   ALT 445 (H) 0 - 44 U/L   Alkaline Phosphatase 105 38 - 126 U/L    Total Bilirubin 0.9 0.3 - 1.2 mg/dL   GFR, Estimated 22 (L) >60 mL/min   Anion gap 15 5 - 15  CBC with Differential  Result Value Ref Range   WBC 34.5 (H) 4.0 - 10.5 K/uL   RBC 3.50 (L) 3.87 - 5.11 MIL/uL   Hemoglobin 12.1 12.0 - 15.0 g/dL   HCT 78.2 95.6 - 21.3 %   MCV 110.0 (H) 80.0 - 100.0 fL   MCH 34.6 (H) 26.0 - 34.0 pg   MCHC 31.4 30.0 - 36.0 g/dL   RDW 08.6 57.8 - 46.9 %   Platelets 232 150 - 400 K/uL   nRBC 0.3 (H) 0.0 - 0.2 %   Neutrophils Relative % 86 %   Neutro Abs 30.0 (H) 1.7 - 7.7 K/uL   Lymphocytes Relative 5 %   Lymphs Abs 1.6 0.7 - 4.0 K/uL   Monocytes Relative 7 %   Monocytes Absolute 2.3 (H) 0.1 - 1.0 K/uL   Eosinophils Relative 0 %   Eosinophils Absolute 0.1 0.0 - 0.5 K/uL   Basophils Relative 1 %   Basophils Absolute 0.2 (H) 0.0 - 0.1 K/uL   Immature Granulocytes 1 %   Abs Immature Granulocytes 0.34 (H) 0.00 - 0.07 K/uL  Protime-INR  Result Value Ref Range   Prothrombin Time 15.5 (H) 11.4 - 15.2 seconds   INR 1.2 0.8 - 1.2  APTT  Result Value Ref Range   aPTT 29 24 - 36 seconds  Brain natriuretic peptide  Result Value Ref Range   B Natriuretic Peptide 697.0 (H) 0.0 - 100.0 pg/mL  Urinalysis, w/ Reflex to Culture (Infection Suspected) -Urine, Clean Catch  Result Value Ref Range   Specimen Source URINE, CLEAN CATCH    Color, Urine AMBER (A) YELLOW   APPearance CLOUDY (A) CLEAR   Specific Gravity, Urine 1.027 1.005 - 1.030   pH 5.0 5.0 - 8.0   Glucose, UA NEGATIVE NEGATIVE mg/dL   Hgb urine dipstick NEGATIVE NEGATIVE   Bilirubin Urine NEGATIVE NEGATIVE   Ketones, ur NEGATIVE NEGATIVE mg/dL   Protein, ur 629 (A) NEGATIVE mg/dL   Nitrite NEGATIVE NEGATIVE   Leukocytes,Ua NEGATIVE NEGATIVE   RBC / HPF 0-5 0 - 5 RBC/hpf   WBC, UA 6-10 0 - 5 WBC/hpf   Bacteria, UA RARE (A) NONE SEEN   Squamous Epithelial / HPF 0-5 0 - 5 /HPF   Hyaline Casts, UA PRESENT    Amorphous Crystal PRESENT   Blood gas, arterial (at [facility])  Result Value Ref Range   pH,  Arterial 7.15 (LL) 7.35 - 7.45   pCO2 arterial 96 (HH) 32 - 48 mmHg  pO2, Arterial 161 (H) 83 - 108 mmHg   Bicarbonate 33.4 (H) 20.0 - 28.0 mmol/L   Acid-Base Excess 1.5 0.0 - 2.0 mmol/L   O2 Saturation 96.1 %   Patient temperature 37.1    Collection site RIGHT BRACHIAL    Drawn by [id number]    Allens test (pass/fail) PASS PASS  Magnesium  Result Value Ref Range   Magnesium 2.1 1.7 - 2.4 mg/dL  Blood gas, arterial  Result Value Ref Range   pH, Arterial 7.19 (LL) 7.35 - 7.45   pCO2 arterial 77 (HH) 32 - 48 mmHg   pO2, Arterial 103 83 - 108 mmHg   Bicarbonate 29.4 (H) 20.0 - 28.0 mmol/L   Acid-base deficit 0.9 0.0 - 2.0 mmol/L   O2 Saturation 99.8 %   Patient temperature 37.0    Collection site RIGHT BRACHIAL    Drawn by [id number]    Allens test (pass/fail) PASS PASS  Strep pneumoniae urinary antigen (not at [facility])  Result Value Ref Range   Strep Pneumo Urinary Antigen NEGATIVE NEGATIVE  Magnesium  Result Value Ref Range   Magnesium 1.9 1.7 - 2.4 mg/dL  Blood gas, arterial  Result Value Ref Range   FIO2 40 %   Mode BILEVEL POSITIVE AIRWAY PRESSURE    RATE 18 resp/min   PEEP 5 cm H20   pH, Arterial 7.27 (L) 7.35 - 7.45   pCO2 arterial 68 (HH) 32 - 48 mmHg   pO2, Arterial 92 83 - 108 mmHg   Bicarbonate 31.4 (H) 20.0 - 28.0 mmol/L   Acid-Base Excess 2.7 (H) 0.0 - 2.0 mmol/L   O2 Saturation 99.7 %   Patient temperature 36.3    Collection site LEFT RADIAL    Drawn by [id number]    Allens test (pass/fail) PASS PASS  Hepatitis panel, acute  Result Value Ref Range   Hepatitis B Surface Ag NON REACTIVE NON REACTIVE   HCV Ab NON REACTIVE NON REACTIVE   Hep A IgM NON REACTIVE NON REACTIVE   Hep B C IgM NON REACTIVE NON REACTIVE  Hemoglobin A1c  Result Value Ref Range   Hgb A1c MFr Bld 6.2 (H) 4.8 - 5.6 %   Mean Plasma Glucose 131.24 mg/dL  Heparin level (unfractionated)  Result Value Ref Range   Heparin Unfractionated 0.71 (H) 0.30 - 0.70 IU/mL  Glucose, capillary  Result Value Ref  Range   Glucose-Capillary 155 (H) 70 - 99 mg/dL   Comment 1 Notify RN    Comment 2 Document in Chart   Legionella Pneumophila Serogp 1 Ur Ag  Result Value Ref Range   L. pneumophila Serogp 1 Ur Ag Negative Negative   Source of Sample URINE, CLEAN CATCH   Glucose, capillary  Result Value Ref Range   Glucose-Capillary 167 (H) 70 - 99 mg/dL   Comment 1 Notify RN    Comment 2 Document in Chart   Magnesium  Result Value Ref Range   Magnesium 2.1 1.7 - 2.4 mg/dL  CBC  Result Value Ref Range   WBC 37.4 (H) 4.0 - 10.5 K/uL   RBC 3.22 (L) 3.87 - 5.11 MIL/uL   Hemoglobin 11.1 (L) 12.0 - 15.0 g/dL   HCT 32.3 (L) 55.7 - 32.2 %   MCV 110.9 (H) 80.0 - 100.0 fL   MCH 34.5 (H) 26.0 - 34.0 pg   MCHC 31.1 30.0 - 36.0 g/dL   RDW 02.5 42.7 - 06.2 %   Platelets 230 150 - 400 K/uL   nRBC 0.4 (  H) 0.0 - 0.2 %  Comprehensive metabolic panel  Result Value Ref Range   Sodium 141 135 - 145 mmol/L   Potassium 4.7 3.5 - 5.1 mmol/L   Chloride 101 98 - 111 mmol/L   CO2 29 22 - 32 mmol/L   Glucose, Bld 135 (H) 70 - 99 mg/dL   BUN 50 (H) 8 - 23 mg/dL   Creatinine, Ser 6.57 (H) 0.44 - 1.00 mg/dL   Calcium 8.8 (L) 8.9 - 10.3 mg/dL   Total Protein 6.4 (L) 6.5 - 8.1 g/dL   Albumin 2.5 (L) 3.5 - 5.0 g/dL   AST 846 (H) 15 - 41 U/L   ALT 335 (H) 0 - 44 U/L   Alkaline Phosphatase 122 38 - 126 U/L   Total Bilirubin 0.8 0.3 - 1.2 mg/dL   GFR, Estimated 28 (L) >60 mL/min   Anion gap 11 5 - 15  CBC with Differential/Platelet  Result Value Ref Range   WBC 39.3 (H) 4.0 - 10.5 K/uL   RBC 3.26 (L) 3.87 - 5.11 MIL/uL   Hemoglobin 11.1 (L) 12.0 - 15.0 g/dL   HCT 96.2 95.2 - 84.1 %   MCV 112.9 (H) 80.0 - 100.0 fL   MCH 34.0 26.0 - 34.0 pg   MCHC 30.2 30.0 - 36.0 g/dL   RDW 32.4 40.1 - 02.7 %   Platelets 212 150 - 400 K/uL   nRBC 0.4 (H) 0.0 - 0.2 %   Neutrophils Relative % 90 %   Neutro Abs 37.7 (H) 1.7 - 7.7 K/uL   Band Neutrophils 6 %   Lymphocytes Relative 1 %   Lymphs Abs 0.4 (L) 0.7 - 4.0 K/uL    Monocytes Relative 3 %   Monocytes Absolute 1.2 (H) 0.1 - 1.0 K/uL   Eosinophils Relative 0 %   Eosinophils Absolute 0.0 0.0 - 0.5 K/uL   Basophils Relative 0 %   Basophils Absolute 0.0 0.0 - 0.1 K/uL   WBC Morphology Mild Left Shift (1-5% metas, occ myelo)    nRBC 1 (H) 0 /100 WBC   Abs Immature Granulocytes 0.00 0.00 - 0.07 K/uL  Comprehensive metabolic panel  Result Value Ref Range   Sodium 140 135 - 145 mmol/L   Potassium 4.7 3.5 - 5.1 mmol/L   Chloride 99 98 - 111 mmol/L   CO2 29 22 - 32 mmol/L   Glucose, Bld 171 (H) 70 - 99 mg/dL   BUN 47 (H) 8 - 23 mg/dL   Creatinine, Ser 2.53 (H) 0.44 - 1.00 mg/dL   Calcium 8.6 (L) 8.9 - 10.3 mg/dL   Total Protein 6.5 6.5 - 8.1 g/dL   Albumin 2.6 (L) 3.5 - 5.0 g/dL   AST 664 (H) 15 - 41 U/L   ALT 390 (H) 0 - 44 U/L   Alkaline Phosphatase 116 38 - 126 U/L   Total Bilirubin 0.6 0.3 - 1.2 mg/dL   GFR, Estimated 23 (L) >60 mL/min   Anion gap 12 5 - 15  Heparin level (unfractionated)  Result Value Ref Range   Heparin Unfractionated >1.10 (H) 0.30 - 0.70 IU/mL  Glucose, capillary  Result Value Ref Range   Glucose-Capillary 148 (H) 70 - 99 mg/dL  Heparin level (unfractionated)  Result Value Ref Range   Heparin Unfractionated 0.87 (H) 0.30 - 0.70 IU/mL  Blood gas, venous  Result Value Ref Range   pH, Ven 7.27 7.25 - 7.43   pCO2, Ven 77 (HH) 44 - 60 mmHg   pO2, Ven  46 (H) 32 - 45 mmHg   Bicarbonate 35.4 (H) 20.0 - 28.0 mmol/L   Acid-Base Excess 5.7 (H) 0.0 - 2.0 mmol/L   O2 Saturation 79.4 %   Patient temperature 37.0   Glucose, capillary  Result Value Ref Range   Glucose-Capillary 142 (H) 70 - 99 mg/dL  Glucose, capillary  Result Value Ref Range   Glucose-Capillary 136 (H) 70 - 99 mg/dL  Comprehensive metabolic panel  Result Value Ref Range   Sodium 135 135 - 145 mmol/L   Potassium 4.3 3.5 - 5.1 mmol/L   Chloride 97 (L) 98 - 111 mmol/L   CO2 28 22 - 32 mmol/L   Glucose, Bld 126 (H) 70 - 99 mg/dL   BUN 54 (H) 8 - 23 mg/dL    Creatinine, Ser 3.24 (H) 0.44 - 1.00 mg/dL   Calcium 8.9 8.9 - 40.1 mg/dL   Total Protein 6.2 (L) 6.5 - 8.1 g/dL   Albumin 2.5 (L) 3.5 - 5.0 g/dL   AST 027 (H) 15 - 41 U/L   ALT 265 (H) 0 - 44 U/L   Alkaline Phosphatase 136 (H) 38 - 126 U/L   Total Bilirubin 0.7 0.3 - 1.2 mg/dL   GFR, Estimated 30 (L) >60 mL/min   Anion gap 10 5 - 15  Glucose, capillary  Result Value Ref Range   Glucose-Capillary 138 (H) 70 - 99 mg/dL  Glucose, capillary  Result Value Ref Range   Glucose-Capillary 149 (H) 70 - 99 mg/dL  Glucose, capillary  Result Value Ref Range   Glucose-Capillary 110 (H) 70 - 99 mg/dL   Comment 1 Notify RN    Comment 2 Document in Chart   Comprehensive metabolic panel  Result Value Ref Range   Sodium 139 135 - 145 mmol/L   Potassium 3.7 3.5 - 5.1 mmol/L   Chloride 97 (L) 98 - 111 mmol/L   CO2 32 22 - 32 mmol/L   Glucose, Bld 122 (H) 70 - 99 mg/dL   BUN 53 (H) 8 - 23 mg/dL   Creatinine, Ser 2.53 (H) 0.44 - 1.00 mg/dL   Calcium 9.6 8.9 - 66.4 mg/dL   Total Protein 6.5 6.5 - 8.1 g/dL   Albumin 2.5 (L) 3.5 - 5.0 g/dL   AST 403 (H) 15 - 41 U/L   ALT 198 (H) 0 - 44 U/L   Alkaline Phosphatase 122 38 - 126 U/L   Total Bilirubin 0.9 0.3 - 1.2 mg/dL   GFR, Estimated 35 (L) >60 mL/min   Anion gap 10 5 - 15  Glucose, capillary  Result Value Ref Range   Glucose-Capillary 125 (H) 70 - 99 mg/dL  Blood gas, arterial  Result Value Ref Range   FIO2 NASAL CANNULA %   O2 Content 6.0 L/min   pH, Arterial 7.35 7.35 - 7.45   pCO2 arterial 78 (HH) 32 - 48 mmHg   pO2, Arterial 97 83 - 108 mmHg   Bicarbonate 43.1 (H) 20.0 - 28.0 mmol/L   Acid-Base Excess 13.9 (H) 0.0 - 2.0 mmol/L   O2 Saturation 98.9 %   Patient temperature 37.0    Collection site RIGHT RADIAL    Drawn by [id number]    Allens test (pass/fail) PASS PASS  Ammonia  Result Value Ref Range   Ammonia 25 9 - 35 umol/L  Vitamin B1  Result Value Ref Range   Vitamin B1 (Thiamine) 205.5 (H) 66.5 - 200.0 nmol/L  Vitamin B12   Result Value Ref Range   Vitamin  B-12 1,232 (H) 180 - 914 pg/mL  Folate  Result Value Ref Range   Folate 21.3 >5.9 ng/mL  TSH  Result Value Ref Range   TSH 0.256 (L) 0.350 - 4.500 uIU/mL  Legionella Pneumophila Serogp 1 Ur Ag  Result Value Ref Range   L. pneumophila Serogp 1 Ur Ag Negative Negative   Source of Sample URINE, RANDOM   Glucose, capillary  Result Value Ref Range   Glucose-Capillary 129 (H) 70 - 99 mg/dL  Comprehensive metabolic panel  Result Value Ref Range   Sodium 141 135 - 145 mmol/L   Potassium 3.0 (L) 3.5 - 5.1 mmol/L   Chloride 91 (L) 98 - 111 mmol/L   CO2 40 (H) 22 - 32 mmol/L   Glucose, Bld 153 (H) 70 - 99 mg/dL   BUN 46 (H) 8 - 23 mg/dL   Creatinine, Ser 5.62 (H) 0.44 - 1.00 mg/dL   Calcium 9.4 8.9 - 13.0 mg/dL   Total Protein 6.2 (L) 6.5 - 8.1 g/dL   Albumin 2.4 (L) 3.5 - 5.0 g/dL   AST 52 (H) 15 - 41 U/L   ALT 139 (H) 0 - 44 U/L   Alkaline Phosphatase 105 38 - 126 U/L   Total Bilirubin 0.8 0.3 - 1.2 mg/dL   GFR, Estimated 47 (L) >60 mL/min   Anion gap 10 5 - 15  Glucose, capillary  Result Value Ref Range   Glucose-Capillary 212 (H) 70 - 99 mg/dL  Magnesium  Result Value Ref Range   Magnesium 1.6 (L) 1.7 - 2.4 mg/dL  Glucose, capillary  Result Value Ref Range   Glucose-Capillary 122 (H) 70 - 99 mg/dL  Glucose, capillary  Result Value Ref Range   Glucose-Capillary 147 (H) 70 - 99 mg/dL  CBC  Result Value Ref Range   WBC 31.8 (H) 4.0 - 10.5 K/uL   RBC 3.69 (L) 3.87 - 5.11 MIL/uL   Hemoglobin 12.3 12.0 - 15.0 g/dL   HCT 86.5 78.4 - 69.6 %   MCV 104.1 (H) 80.0 - 100.0 fL   MCH 33.3 26.0 - 34.0 pg   MCHC 32.0 30.0 - 36.0 g/dL   RDW 29.5 28.4 - 13.2 %   Platelets 185 150 - 400 K/uL   nRBC 0.1 0.0 - 0.2 %  Glucose, capillary  Result Value Ref Range   Glucose-Capillary 188 (H) 70 - 99 mg/dL   Comment 1 Notify RN    Comment 2 Document in Chart   Glucose, capillary  Result Value Ref Range   Glucose-Capillary 179 (H) 70 - 99 mg/dL    Comment 1 Notify RN    Comment 2 Document in Chart   Comprehensive metabolic panel  Result Value Ref Range   Sodium 141 135 - 145 mmol/L   Potassium 2.8 (L) 3.5 - 5.1 mmol/L   Chloride 85 (L) 98 - 111 mmol/L   CO2 41 (H) 22 - 32 mmol/L   Glucose, Bld 129 (H) 70 - 99 mg/dL   BUN 42 (H) 8 - 23 mg/dL   Creatinine, Ser 4.40 (H) 0.44 - 1.00 mg/dL   Calcium 9.6 8.9 - 10.2 mg/dL   Total Protein 6.2 (L) 6.5 - 8.1 g/dL   Albumin 2.7 (L) 3.5 - 5.0 g/dL   AST 38 15 - 41 U/L   ALT 101 (H) 0 - 44 U/L   Alkaline Phosphatase 97 38 - 126 U/L   Total Bilirubin 1.3 (H) 0.3 - 1.2 mg/dL   GFR, Estimated 53 (L) >60  mL/min   Anion gap 15 5 - 15  CBC  Result Value Ref Range   WBC 33.3 (H) 4.0 - 10.5 K/uL   RBC 3.86 (L) 3.87 - 5.11 MIL/uL   Hemoglobin 13.0 12.0 - 15.0 g/dL   HCT 82.9 56.2 - 13.0 %   MCV 104.7 (H) 80.0 - 100.0 fL   MCH 33.7 26.0 - 34.0 pg   MCHC 32.2 30.0 - 36.0 g/dL   RDW 86.5 78.4 - 69.6 %   Platelets 191 150 - 400 K/uL   nRBC 0.1 0.0 - 0.2 %  Magnesium  Result Value Ref Range   Magnesium 2.0 1.7 - 2.4 mg/dL  Glucose, capillary  Result Value Ref Range   Glucose-Capillary 128 (H) 70 - 99 mg/dL  Glucose, capillary  Result Value Ref Range   Glucose-Capillary 139 (H) 70 - 99 mg/dL  Blood gas, venous  Result Value Ref Range   pH, Ven 7.55 (H) 7.25 - 7.43   pCO2, Ven 62 (H) 44 - 60 mmHg   pO2, Ven 57 (H) 32 - 45 mmHg   Bicarbonate 54.2 (H) 20.0 - 28.0 mmol/L   Acid-Base Excess 27.1 (H) 0.0 - 2.0 mmol/L   O2 Saturation 92.7 %   Patient temperature 37.0   Glucose, capillary  Result Value Ref Range   Glucose-Capillary 300 (H) 70 - 99 mg/dL  CBC  Result Value Ref Range   WBC 27.8 (H) 4.0 - 10.5 K/uL   RBC 3.79 (L) 3.87 - 5.11 MIL/uL   Hemoglobin 12.8 12.0 - 15.0 g/dL   HCT 29.5 28.4 - 13.2 %   MCV 106.9 (H) 80.0 - 100.0 fL   MCH 33.8 26.0 - 34.0 pg   MCHC 31.6 30.0 - 36.0 g/dL   RDW 44.0 10.2 - 72.5 %   Platelets 176 150 - 400 K/uL   nRBC 0.1 0.0 - 0.2 %  Basic  metabolic panel  Result Value Ref Range   Sodium 142 135 - 145 mmol/L   Potassium 3.4 (L) 3.5 - 5.1 mmol/L   Chloride 93 (L) 98 - 111 mmol/L   CO2 37 (H) 22 - 32 mmol/L   Glucose, Bld 110 (H) 70 - 99 mg/dL   BUN 38 (H) 8 - 23 mg/dL   Creatinine, Ser 3.66 (H) 0.44 - 1.00 mg/dL   Calcium 9.3 8.9 - 44.0 mg/dL   GFR, Estimated 53 (L) >60 mL/min   Anion gap 12 5 - 15  Glucose, capillary  Result Value Ref Range   Glucose-Capillary 131 (H) 70 - 99 mg/dL  Glucose, capillary  Result Value Ref Range   Glucose-Capillary 119 (H) 70 - 99 mg/dL  Glucose, capillary  Result Value Ref Range   Glucose-Capillary 127 (H) 70 - 99 mg/dL  Glucose, capillary  Result Value Ref Range   Glucose-Capillary 138 (H) 70 - 99 mg/dL  Glucose, capillary  Result Value Ref Range   Glucose-Capillary 155 (H) 70 - 99 mg/dL  Glucose, capillary  Result Value Ref Range   Glucose-Capillary 176 (H) 70 - 99 mg/dL  Basic metabolic panel  Result Value Ref Range   Sodium 140 135 - 145 mmol/L   Potassium 3.1 (L) 3.5 - 5.1 mmol/L   Chloride 94 (L) 98 - 111 mmol/L   CO2 35 (H) 22 - 32 mmol/L   Glucose, Bld 106 (H) 70 - 99 mg/dL   BUN 39 (H) 8 - 23 mg/dL   Creatinine, Ser 3.47 (H) 0.44 - 1.00 mg/dL   Calcium  9.1 8.9 - 10.3 mg/dL   GFR, Estimated 50 (L) >60 mL/min   Anion gap 11 5 - 15  Magnesium  Result Value Ref Range   Magnesium 1.5 (L) 1.7 - 2.4 mg/dL  Glucose, capillary  Result Value Ref Range   Glucose-Capillary 107 (H) 70 - 99 mg/dL  Glucose, capillary  Result Value Ref Range   Glucose-Capillary 123 (H) 70 - 99 mg/dL  Glucose, capillary  Result Value Ref Range   Glucose-Capillary 270 (H) 70 - 99 mg/dL  Glucose, capillary  Result Value Ref Range   Glucose-Capillary 154 (H) 70 - 99 mg/dL  Glucose, capillary  Result Value Ref Range   Glucose-Capillary 95 70 - 99 mg/dL  Glucose, capillary  Result Value Ref Range   Glucose-Capillary 104 (H) 70 - 99 mg/dL  Glucose, capillary  Result Value Ref Range    Glucose-Capillary 139 (H) 70 - 99 mg/dL  ECHOCARDIOGRAM COMPLETE  Result Value Ref Range   Weight 2,747.81 oz   Height 64 in   BP 140/70 mmHg   Single Plane A2C EF 73.8 %   Single Plane A4C EF 59.5 %   Calc EF 67.0 %   S' Lateral 2.40 cm   AR max vel 1.49 cm2   AV Area VTI 1.49 cm2   AV Mean grad 14.0 mmHg   AV Peak grad 24.6 mmHg   Ao pk vel 2.48 m/s   Area-P 1/2 3.92 cm2   AV Area mean vel 1.57 cm2   Est EF 60 - 65%   Troponin I (High Sensitivity)  Result Value Ref Range   Troponin I (High Sensitivity) 84 (H) <18 ng/L  Troponin I (High Sensitivity)  Result Value Ref Range   Troponin I (High Sensitivity) 109 (HH) <18 ng/L  Troponin I (High Sensitivity)  Result Value Ref Range   Troponin I (High Sensitivity) 117 (HH) <18 ng/L  Troponin I (High Sensitivity)  Result Value Ref Range   Troponin I (High Sensitivity) 106 (HH) <18 ng/L  Troponin I (High Sensitivity)  Result Value Ref Range   Troponin I (High Sensitivity) 102 (HH) <18 ng/L     EKG: A-Fib, 88, QT 322 ms, no acute ST-T changes. Rate controlled. Kari Baars, FNP-C  Pertinent labs & imaging results that were available during my care of the patient were reviewed by me and considered in my medical decision making.  Assessment & Plan:  Riddhi was seen today for hospitalization follow-up.  Diagnoses and all orders for this visit:  Pt needs to keep follow up with pulmonology and cardiology as scheduled. If liver enzymes remain elevated, will refer to GI/hepatologist.  Will update all other labs as below. Pressure wound to coccyx is healing well. Continue with home care. Medications reconciled today. Will refill triamcinolone cream for psoriasis. Pt reports hard stools. Will send in Miralax. Educated on proper dosing along with adequate water and fiber intake. Report new or worsening symptoms.   Hospital discharge follow-up Today's visit was for Transitional Care Management. The patient was discharged from [facility] on 02/11/2023 with a primary diagnosis of Acute respiratory failure.Benay Pillow with the patient and/or caregiver, by a clinical staff member, was made on 02/12/2023 and was documented as a telephone encounter within the EMR. Through chart review and discussion with the patient I have determined that management of their condition is of high complexity.     Acute respiratory failure with hypoxia and hypercapnia (HCC) -     EKG 12-Lead -  CBC with Differential/Platelet -     Brain natriuretic peptide -     Hepatic function panel -     BMP8+EGFR -     Magnesium  Septic shock (HCC) -     EKG 12-Lead -     CBC with Differential/Platelet -     Brain natriuretic peptide -     Hepatic function panel -     BMP8+EGFR -     Magnesium  New onset atrial fibrillation (HCC) -     EKG 12-Lead -     CBC with Differential/Platelet -     Brain natriuretic peptide -     Hepatic function panel -     BMP8+EGFR -     Magnesium  Atrial fibrillation with RVR (HCC) -     EKG 12-Lead -     CBC with Differential/Platelet -     Brain natriuretic peptide -     Hepatic function panel -     BMP8+EGFR -     Magnesium  Acute on chronic diastolic heart failure (HCC) -     EKG 12-Lead -     CBC with Differential/Platelet -     Brain natriuretic peptide -     Hepatic function panel -     BMP8+EGFR -     Magnesium  COPD with acute exacerbation (HCC) -     EKG 12-Lead -     CBC with Differential/Platelet -     Brain natriuretic peptide -     Hepatic function panel -     BMP8+EGFR -     Magnesium  AKI (acute kidney injury) (HCC) -     EKG 12-Lead -     CBC with Differential/Platelet -     Brain natriuretic peptide -     Hepatic function panel -     BMP8+EGFR -     Magnesium  Shock liver -     EKG 12-Lead -     CBC with Differential/Platelet -     Brain natriuretic peptide -     Hepatic function panel -     BMP8+EGFR -     Magnesium  Acute metabolic encephalopathy -     EKG  12-Lead -     CBC with Differential/Platelet -     Brain natriuretic peptide -     Hepatic function panel -     BMP8+EGFR -     Magnesium  Hard stool -     polyethylene glycol powder (GLYCOLAX/MIRALAX) 17 GM/SCOOP powder; Take 17 g by mouth daily.  Psoriasis -     triamcinolone (KENALOG) 0.025 % cream; Apply 1 Application topically 2 (two) times daily.   Continue all other maintenance medications.  Follow up plan: Return in about 4 weeks (around 03/19/2023), or if symptoms worsen or fail to improve, for CXR.   Continue healthy lifestyle choices, including diet (rich in fruits, vegetables, and lean proteins, and low in salt and simple carbohydrates) and exercise (at least 30 minutes of moderate physical activity daily).    The above assessment and management plan was discussed with the patient. The patient verbalized understanding of and has agreed to the management plan. Patient is aware to call the clinic if they develop any new symptoms or if symptoms persist or worsen. Patient is aware when to return to the clinic for a follow-up visit. Patient educated on when it is appropriate to go to the emergency department.   Kari Baars, FNP-C [facility] [phone]

## 2023-02-20 ENCOUNTER — Encounter (HOSPITAL_COMMUNITY): Payer: Self-pay

## 2023-02-20 ENCOUNTER — Other Ambulatory Visit: Payer: Self-pay

## 2023-02-20 ENCOUNTER — Emergency Department (HOSPITAL_COMMUNITY)
Admission: EM | Admit: 2023-02-20 | Discharge: 2023-02-20 | Disposition: A | Discharge status: Home / Self Care | Payer: Medicare HMO | Attending: Student | Admitting: Student

## 2023-02-20 ENCOUNTER — Telehealth: Payer: Self-pay

## 2023-02-20 DIAGNOSIS — R8290 Unspecified abnormal findings in urine: Secondary | ICD-10-CM | POA: Diagnosis not present

## 2023-02-20 DIAGNOSIS — E875 Hyperkalemia: Secondary | ICD-10-CM | POA: Diagnosis not present

## 2023-02-20 DIAGNOSIS — Z79899 Other long term (current) drug therapy: Secondary | ICD-10-CM | POA: Diagnosis not present

## 2023-02-20 DIAGNOSIS — D72829 Elevated white blood cell count, unspecified: Secondary | ICD-10-CM | POA: Diagnosis not present

## 2023-02-20 DIAGNOSIS — N179 Acute kidney failure, unspecified: Secondary | ICD-10-CM | POA: Insufficient documentation

## 2023-02-20 DIAGNOSIS — I5030 Unspecified diastolic (congestive) heart failure: Secondary | ICD-10-CM | POA: Insufficient documentation

## 2023-02-20 DIAGNOSIS — E876 Hypokalemia: Secondary | ICD-10-CM | POA: Insufficient documentation

## 2023-02-20 DIAGNOSIS — J449 Chronic obstructive pulmonary disease, unspecified: Secondary | ICD-10-CM | POA: Insufficient documentation

## 2023-02-20 DIAGNOSIS — R7401 Elevation of levels of liver transaminase levels: Secondary | ICD-10-CM | POA: Insufficient documentation

## 2023-02-20 DIAGNOSIS — I5033 Acute on chronic diastolic (congestive) heart failure: Secondary | ICD-10-CM | POA: Diagnosis not present

## 2023-02-20 DIAGNOSIS — I110 Hypertensive heart disease with heart failure: Secondary | ICD-10-CM | POA: Diagnosis not present

## 2023-02-20 DIAGNOSIS — I4891 Unspecified atrial fibrillation: Secondary | ICD-10-CM | POA: Diagnosis not present

## 2023-02-20 LAB — BMP8+EGFR
BUN/Creatinine Ratio: 35 — ABNORMAL HIGH (ref 12–28)
BUN: 51 mg/dL — ABNORMAL HIGH (ref 8–27)
CO2: 24 mmol/L (ref 20–29)
Calcium: 10.4 mg/dL — ABNORMAL HIGH (ref 8.7–10.3)
Chloride: 103 mmol/L (ref 96–106)
Creatinine, Ser: 1.46 mg/dL — ABNORMAL HIGH (ref 0.57–1.00)
Glucose: 87 mg/dL (ref 70–99)
Potassium: 6.2 mmol/L (ref 3.5–5.2)
Sodium: 145 mmol/L — ABNORMAL HIGH (ref 134–144)
eGFR: 37 mL/min/{1.73_m2} — ABNORMAL LOW (ref >59)

## 2023-02-20 LAB — BASIC METABOLIC PANEL
Anion gap: 10 (ref 5–15)
BUN: 55 mg/dL — ABNORMAL HIGH (ref 8–23)
CO2: 22 mmol/L (ref 22–32)
Calcium: 8.6 mg/dL — ABNORMAL LOW (ref 8.9–10.3)
Chloride: 103 mmol/L (ref 98–111)
Creatinine, Ser: 1.63 mg/dL — ABNORMAL HIGH (ref 0.44–1.00)
GFR, Estimated: 32 mL/min — ABNORMAL LOW (ref >60)
Glucose, Bld: 100 mg/dL — ABNORMAL HIGH (ref 70–99)
Potassium: 4.7 mmol/L (ref 3.5–5.1)
Sodium: 135 mmol/L (ref 135–145)

## 2023-02-20 LAB — CBC WITH DIFFERENTIAL/PLATELET
Abs Immature Granulocytes: 0.08 10*3/uL — ABNORMAL HIGH (ref 0.00–0.07)
Basophils Absolute: 0 10*3/uL (ref 0.0–0.2)
Basophils Absolute: 0 10*3/uL (ref 0.0–0.1)
Basophils Relative: 0 %
Basos: 0 %
EOS (ABSOLUTE): 0.1 10*3/uL (ref 0.0–0.4)
Eos: 0 %
Eosinophils Absolute: 0.2 10*3/uL (ref 0.0–0.5)
Eosinophils Relative: 2 %
HCT: 40.3 % (ref 36.0–46.0)
Hematocrit: 42.4 % (ref 34.0–46.6)
Hemoglobin: 13.4 g/dL (ref 11.1–15.9)
Hemoglobin: 12.4 g/dL (ref 12.0–15.0)
Immature Grans (Abs): 0 10*3/uL (ref 0.0–0.1)
Immature Granulocytes: 0 %
Immature Granulocytes: 1 %
Lymphocytes Absolute: 2.4 10*3/uL (ref 0.7–3.1)
Lymphocytes Relative: 27 %
Lymphs Abs: 3.7 10*3/uL (ref 0.7–4.0)
Lymphs: 15 %
MCH: 33.3 pg — ABNORMAL HIGH (ref 26.6–33.0)
MCH: 32.9 pg (ref 26.0–34.0)
MCHC: 31.6 g/dL (ref 31.5–35.7)
MCHC: 30.8 g/dL (ref 30.0–36.0)
MCV: 105 fL — ABNORMAL HIGH (ref 79–97)
MCV: 106.9 fL — ABNORMAL HIGH (ref 80.0–100.0)
Monocytes Absolute: 1.1 10*3/uL — ABNORMAL HIGH (ref 0.1–0.9)
Monocytes Absolute: 1.2 10*3/uL — ABNORMAL HIGH (ref 0.1–1.0)
Monocytes Relative: 8 %
Monocytes: 7 %
Neutro Abs: 8.5 10*3/uL — ABNORMAL HIGH (ref 1.7–7.7)
Neutrophils Absolute: 12.4 10*3/uL — ABNORMAL HIGH (ref 1.4–7.0)
Neutrophils Relative %: 62 %
Neutrophils: 78 %
Platelets: 288 10*3/uL (ref 150–450)
Platelets: 216 10*3/uL (ref 150–400)
RBC: 4.03 x10E6/uL (ref 3.77–5.28)
RBC: 3.77 MIL/uL — ABNORMAL LOW (ref 3.87–5.11)
RDW: 12.7 % (ref 11.7–15.4)
RDW: 13.4 % (ref 11.5–15.5)
WBC: 16 10*3/uL — ABNORMAL HIGH (ref 3.4–10.8)
WBC: 13.7 10*3/uL — ABNORMAL HIGH (ref 4.0–10.5)
nRBC: 0 % (ref 0.0–0.2)

## 2023-02-20 LAB — HEPATIC FUNCTION PANEL
ALT: 171 [IU]/L — ABNORMAL HIGH (ref 0–32)
AST: 78 [IU]/L — ABNORMAL HIGH (ref 0–40)
Albumin: 3.8 g/dL (ref 3.8–4.8)
Alkaline Phosphatase: 82 [IU]/L (ref 44–121)
Bilirubin Total: 0.7 mg/dL (ref 0.0–1.2)
Bilirubin, Direct: 0.18 mg/dL (ref 0.00–0.40)
Total Protein: 6.4 g/dL (ref 6.0–8.5)

## 2023-02-20 LAB — URINALYSIS, ROUTINE W REFLEX MICROSCOPIC
Bilirubin Urine: NEGATIVE
Glucose, UA: NEGATIVE mg/dL
Ketones, ur: NEGATIVE mg/dL
Nitrite: NEGATIVE
Protein, ur: NEGATIVE mg/dL
Specific Gravity, Urine: 1.019 (ref 1.005–1.030)
WBC, UA: >50 WBC/hpf (ref 0–5)
pH: 5 (ref 5.0–8.0)

## 2023-02-20 LAB — COMPREHENSIVE METABOLIC PANEL
ALT: 128 U/L — ABNORMAL HIGH (ref 0–44)
AST: 57 U/L — ABNORMAL HIGH (ref 15–41)
Albumin: 3.2 g/dL — ABNORMAL LOW (ref 3.5–5.0)
Alkaline Phosphatase: 62 U/L (ref 38–126)
Anion gap: 8 (ref 5–15)
BUN: 59 mg/dL — ABNORMAL HIGH (ref 8–23)
CO2: 28 mmol/L (ref 22–32)
Calcium: 9.1 mg/dL (ref 8.9–10.3)
Chloride: 102 mmol/L (ref 98–111)
Creatinine, Ser: 1.87 mg/dL — ABNORMAL HIGH (ref 0.44–1.00)
GFR, Estimated: 27 mL/min — ABNORMAL LOW (ref >60)
Glucose, Bld: 117 mg/dL — ABNORMAL HIGH (ref 70–99)
Potassium: 5.4 mmol/L — ABNORMAL HIGH (ref 3.5–5.1)
Sodium: 138 mmol/L (ref 135–145)
Total Bilirubin: 1.2 mg/dL (ref 0.3–1.2)
Total Protein: 6.3 g/dL — ABNORMAL LOW (ref 6.5–8.1)

## 2023-02-20 LAB — MAGNESIUM: Magnesium: 1.5 mg/dL — ABNORMAL LOW (ref 1.6–2.3)

## 2023-02-20 LAB — BRAIN NATRIURETIC PEPTIDE: BNP: 214.9 pg/mL — ABNORMAL HIGH (ref 0.0–100.0)

## 2023-02-20 MED ORDER — LACTATED RINGERS IV BOLUS
1000.0000 mL | Freq: Once | INTRAVENOUS | Status: AC
  Administered 2023-02-20: 1000 mL via INTRAVENOUS

## 2023-02-20 NOTE — ED Notes (Signed)
Patient given Urine cup and told we need urine sample. Patient verbalized understanding.

## 2023-02-20 NOTE — ED Notes (Addendum)
Called back into patients room, swelling noted to IV cite, patient Denise Underwood pain, Blood return from IV., another nurse flushed IV, no increase to arm. removed IV. PA-C made aware. IV removed.

## 2023-02-20 NOTE — ED Notes (Signed)
Patient given turkey sandwich and drink

## 2023-02-20 NOTE — Discharge Instructions (Addendum)
The pleasure taking care of you today.  You were seen for abnormal labs, your potassium is now normal and your kidney function is improving, continue drinking fluids.  Take your lasix every other day and stop taking your potassium until you talk to your doctor and recheck your labs Monday.  If you have any new or worsening symptoms come back to the ER for new or worsening symptoms.  We have put in a referral for cardiology follow-up to manage your fluid pills and atrial fibrillation.  It is important to make sure you are still producing normal amounts of urine.  Start feeling weak, dizzy, have decreased urinary output, start having pain or other symptoms you to come back to the ER right away.

## 2023-02-20 NOTE — ED Provider Notes (Signed)
[place] EMERGENCY DEPARTMENT AT [facility] Provider Note   CSN: [id number] Arrival date & time: 02/20/23  1056     History  Chief Complaint  Patient presents with   Abnormal Lab    Denise Underwood is a 77 y.o. female.  He has PMH of COPD, A-fib, psoriasis.  Her recent hospitalization for sepsis with pneumonia and respiratory failure was discharged a week ago.    Had labs yesterday by PCP and called this morning and told to come to the ER due to elevated renal function and elevated potassium.  Patient states she did eat and drink well and has no complaints.  She has been taking potassium supplements daily because she was started on Lasix while in the hospital.  Do not take potassium this morning after receiving the phone call.   Abnormal Lab      Home Medications Prior to Admission medications   Medication Sig Start Date End Date Taking? Authorizing Provider  albuterol (VENTOLIN HFA) 108 (90 Base) MCG/ACT inhaler Inhale 2 puffs into the lungs every 6 (six) hours as needed. 06/21/20   Gwenlyn Fudge, FNP  apixaban (ELIQUIS) 5 MG TABS tablet Take 1 tablet (5 mg total) by mouth 2 (two) times daily. 02/11/23   Rai, Ripudeep K, MD  CALCIUM PO Take 1 tablet by mouth daily.    [provider]  cetirizine (ZYRTEC) 10 MG tablet Take 10 mg by mouth daily.    [provider]  Cholecalciferol (VITAMIN D3) 1000 units CAPS Take 2,000 Units by mouth.    [provider]  diltiazem (CARDIZEM CD) 180 MG 24 hr capsule Take 1 capsule (180 mg total) by mouth daily. 02/11/23   Rai, Ripudeep K, MD  Emollient (CERAVE DIABETICS DRY SKIN) CREA Apply 1 Application topically 2 (two) times daily. 06/19/22   Sonny Masters, FNP  escitalopram (LEXAPRO) 20 MG tablet take 1 tablet once daily. 02/12/23   Sonny Masters, FNP  fluticasone (FLONASE) 50 MCG/ACT nasal spray Place 2 sprays into both nostrils daily. 04/30/22   Sonny Masters, FNP  fluticasone-salmeterol (WIXELA INHUB)  250-50 MCG/ACT AEPB Inhale 1 puff into the lungs in the morning and at bedtime. 02/11/23   Rai, Delene Ruffini, MD  furosemide (LASIX) 20 MG tablet Take 1 tablet (20 mg total) by mouth daily. 02/12/23   Rai, Delene Ruffini, MD  metoprolol tartrate (LOPRESSOR) 25 MG tablet Take 1 tablet (25 mg total) by mouth 2 (two) times daily. 02/11/23   Rai, Delene Ruffini, MD  naloxone [facility]) nasal spray 4 mg/0.1 mL As needed or respiratory depression or decreased mental status related to narcotic use / overdose 03/28/22   Sonny Masters, FNP  nystatin (MYCOSTATIN/NYSTOP) powder Apply 1 application. topically 3 (three) times daily. 10/02/21   Gwenlyn Fudge, FNP  pantoprazole (PROTONIX) 40 MG tablet Take 1 tablet (40 mg total) by mouth daily. 02/11/23   Rai, Ripudeep K, MD  polyethylene glycol powder (GLYCOLAX/MIRALAX) 17 GM/SCOOP powder Take 17 g by mouth daily. 02/19/23   Sonny Masters, FNP  potassium chloride SA (KLOR-CON M) 20 MEQ tablet Take 2 tablets (40 mEq total) by mouth daily. 02/11/23   Rai, Delene Ruffini, MD  predniSONE (DELTASONE) 10 MG tablet Prednisone dosing: Take  Prednisone 40mg  (4 tabs) x 2 days, then taper to 30mg  (3 tabs) x 3 days, then 20mg  (2 tabs) x 3days, then 10mg  (1 tab) x 3days, then OFF. Dispense:  30 tabs, refills: None 02/11/23  Rai, Delene Ruffini, MD  traMADol (ULTRAM) 50 MG tablet Take 2 tablets (100 mg total) by mouth 2 (two) times daily. 01/24/23   Sonny Masters, FNP  traZODone (DESYREL) 50 MG tablet TAKE 1 TABLET BY MOUTH AT BEDTIME. 01/17/23   [name], [name], FNP  triamcinolone (KENALOG) 0.025 % cream Apply 1 Application topically 2 (two) times daily. 02/19/23   Sonny Masters, FNP      Allergies    Patient has no known allergies.    Review of Systems   Review of Systems  Physical Exam Updated Vital Signs Ht 5\' 4"  (1.626 m)   Wt 67.3 kg   BMI 25.47 kg/m  Physical Exam Vitals and nursing note reviewed.  Constitutional:      General: She is not in acute distress.    Appearance: She is  well-developed.  HENT:     Head: Normocephalic and atraumatic.     Mouth/Throat:     Mouth: Mucous membranes are moist.  Eyes:     Conjunctiva/sclera: Conjunctivae normal.  Cardiovascular:     Rate and Rhythm: Normal rate and regular rhythm.     Heart sounds: No murmur heard. Pulmonary:     Effort: Pulmonary effort is normal. No respiratory distress.     Breath sounds: Normal breath sounds.  Abdominal:     Palpations: Abdomen is soft.     Tenderness: There is no abdominal tenderness.  Musculoskeletal:        General: No swelling.     Cervical back: Neck supple.  Skin:    General: Skin is warm and dry.     Capillary Refill: Capillary refill takes less than 2 seconds.  Neurological:     General: No focal deficit present.     Mental Status: She is alert and oriented to person, place, and time.  Psychiatric:        Mood and Affect: Mood normal.     ED Results / Procedures / Treatments   Labs (all labs ordered are listed, but only abnormal results are displayed) Labs Reviewed  COMPREHENSIVE METABOLIC PANEL  CBC WITH DIFFERENTIAL/PLATELET  URINALYSIS, ROUTINE W REFLEX MICROSCOPIC    EKG None  Radiology No results found.  Procedures Procedures    Medications Ordered in ED Medications - No data to display  ED Course/ Medical Decision Making/ A&P Clinical Course as of 02/20/23 1757  Thu Feb 20, 2023  1434 Noted to have AKI today possibly due to overdiuresis, she reports good p.o. intake.  She is overall very well-appearing, blood pressure slightly soft but she is not have any dizziness.  Patient does have a EF of 60 to 65% so we will give IV fluids as she has no signs of fluid overload at this time and recheck labs.  Potassium today noted to be close to normal and there was noted hemolysis so probably was within normal range.  Will recheck BMP after IV fluids. [CB]    Clinical Course User Index [CB] Ma Rings, PA-C                                 Medical  Decision Making This patient presents to the ED for concern of abnormal labs with elevated potassium and AKI, this involves an extensive number of treatment options, and is a complaint that carries with it a high risk of complications and morbidity.  The differential diagnosis includes dehydration, medication side effect,  electrolyte abnormality, other   Co morbidities that complicate the patient evaluation :   A-fib, diastolic CHF, COPD   Additional history obtained:  Additional history obtained from EMR External records from outside source obtained and reviewed including notes   Lab Tests:  I Ordered, and personally interpreted labs.  The pertinent results include: Initial potassium 5.4 though hemolysis noted, initial BUN 59, initial creatinine 1.87, mild transaminitis similar to previous    Cardiac Monitoring: / EKG:  The patient was maintained on a cardiac monitor.  I personally viewed and interpreted the cardiac monitored which showed an underlying rhythm of: Sinus rhythm    Problem List / ED Course / Critical interventions / Medication management  Hyperkalemia-improved from yesterday, initially slightly elevated but likely due to hemolysis, after IV fluids back to normal we will stop potassium supplements for the time being  AKI-patient has worsening renal function from a sign of 1-1.2 to initially 1.87 today, after IV fluids down to 1.63.  This is likely due to overdiuresis, she has no swelling, she is eating and drinking well.  No swelling or shortness of breath after 2 L of IV fluids and patient also had some p.o. intake here.  Advised to continue p.o. intake and discussed recent benefits of stopping her Lasix.  Patient is on 20 mg a day will take every other day and follow-up with her doctor on Monday.  Will stop potassium for the time being.  She is going to call her doctor to make an appointment for recheck of labs on Monday and advised on strict return precautions.  We  discussed risk and benefits of hospitalization, patient would very much prefer to go home rather than being admitted for AKI.  Patient's right forearm had some swelling at the IV site.  There is no swelling redness or tenderness.  Advised on elevation and follow-up with strict return precautions.  Possibly some mild infiltration from the IV he has no other swelling noted  Patient's UA did show moderate leukocytes, greater than 50 white blood cells and rare bacteria but there are 1120 squamous epithelial cells, likely contaminated patient is not having UTI symptoms we will send for culture.  I have reviewed the patients home medicines and have made adjustments as needed   Amount and/or Complexity of Data Reviewed Labs: ordered.           Final Clinical Impression(s) / ED Diagnoses Final diagnoses:  None    Rx / DC Orders ED Discharge Orders     None         Josem Kaufmann 02/20/23 1804    Terrilee Files, MD 02/21/23 1022

## 2023-02-20 NOTE — ED Triage Notes (Signed)
Pt reports she had labs done yesterday through PCP and was told to come to ER for elevated potassium and altered kideny and liver functions.

## 2023-02-20 NOTE — Telephone Encounter (Signed)
Pt has been called and made aware.

## 2023-02-20 NOTE — Telephone Encounter (Signed)
[facility] called with critical of potasium level of 6.2

## 2023-02-21 LAB — URINE CULTURE

## 2023-02-25 ENCOUNTER — Encounter: Payer: Self-pay | Admitting: Nurse Practitioner

## 2023-02-25 ENCOUNTER — Ambulatory Visit: Payer: Medicare HMO | Attending: Nurse Practitioner | Admitting: Nurse Practitioner

## 2023-02-25 ENCOUNTER — Encounter: Payer: Self-pay | Admitting: Family Medicine

## 2023-02-25 ENCOUNTER — Ambulatory Visit: Payer: Medicare HMO | Admitting: Family Medicine

## 2023-02-25 VITALS — BP 102/62 | HR 58 | Temp 96.6°F | Ht 64.0 in | Wt 154.2 lb

## 2023-02-25 VITALS — BP 102/52 | HR 64 | Ht 64.0 in | Wt 153.2 lb

## 2023-02-25 DIAGNOSIS — M150 Primary generalized (osteo)arthritis: Secondary | ICD-10-CM | POA: Diagnosis not present

## 2023-02-25 DIAGNOSIS — J449 Chronic obstructive pulmonary disease, unspecified: Secondary | ICD-10-CM | POA: Diagnosis not present

## 2023-02-25 DIAGNOSIS — F331 Major depressive disorder, recurrent, moderate: Secondary | ICD-10-CM | POA: Diagnosis not present

## 2023-02-25 DIAGNOSIS — I350 Nonrheumatic aortic (valve) stenosis: Secondary | ICD-10-CM

## 2023-02-25 DIAGNOSIS — I10 Essential (primary) hypertension: Secondary | ICD-10-CM | POA: Diagnosis not present

## 2023-02-25 DIAGNOSIS — N179 Acute kidney failure, unspecified: Secondary | ICD-10-CM

## 2023-02-25 DIAGNOSIS — G479 Sleep disorder, unspecified: Secondary | ICD-10-CM

## 2023-02-25 DIAGNOSIS — I5189 Other ill-defined heart diseases: Secondary | ICD-10-CM

## 2023-02-25 DIAGNOSIS — I480 Paroxysmal atrial fibrillation: Secondary | ICD-10-CM | POA: Diagnosis not present

## 2023-02-25 DIAGNOSIS — E782 Mixed hyperlipidemia: Secondary | ICD-10-CM | POA: Diagnosis not present

## 2023-02-25 DIAGNOSIS — Z79899 Other long term (current) drug therapy: Secondary | ICD-10-CM | POA: Diagnosis not present

## 2023-02-25 MED ORDER — TRAMADOL HCL 50 MG PO TABS: 100.0000 mg | ORAL_TABLET | Freq: Two times a day (BID) | ORAL | 0 refills | Status: DC

## 2023-02-25 MED ORDER — TRAZODONE HCL 50 MG PO TABS
50.0000 mg | ORAL_TABLET | Freq: Every day | ORAL | 0 refills | Status: DC
Start: 2023-02-25 — End: 2023-06-10

## 2023-02-25 NOTE — Progress Notes (Signed)
Office Visit    Patient Name: Denise Underwood Date of Encounter: 02/25/2023   Primary Care Provider:  Sonny Masters, FNP Primary Cardiologist:  Little Ishikawa, MD  Chief Complaint    77 year old female with a history of paroxysmal atrial fibrillation, diastolic dysfunction, aortic stenosis, hypertension, hyperlipidemia, prediabetes, COPD, tobacco use, and arthritis who presents for hospital follow-up related to atrial fibrillation.  Past Medical History    Past Medical History:  Diagnosis Date   Anxiety    Arthritis    Hyperlipidemia    Hypertension    Osteopenia 07/04/2021   Prediabetes 07/09/2021   Psoriasis    Past Surgical History:  Procedure Laterality Date   ABDOMINAL HYSTERECTOMY     CHOLECYSTECTOMY     HERNIA REPAIR     KNEE SURGERY     TUBAL LIGATION      Allergies  No Known Allergies   Labs/Other Studies Reviewed    The following studies were reviewed today:  Cardiac Studies & Procedures       ECHOCARDIOGRAM  ECHOCARDIOGRAM COMPLETE 02/04/2023  Narrative ECHOCARDIOGRAM REPORT    [name] Date of Exam: 02/04/2023 Medical Rec #:  [id number]      Height:       64.0 in Accession #:    [id number]     Weight:       171.7 lb Date of Birth:  04/10/1946      BSA:          1.834 m Patient Age:    76 years       BP:           107/67 mmHg Patient Gender: F              HR:           85 bpm. Exam Location:  Inpatient  Procedure: 2D Echo, Color Doppler and Cardiac Doppler  Indications:    I48.91* Unspeicified atrial fibrillation  History:        Patient has no prior history of Echocardiogram examinations. Abnormal ECG, Arrythmias:Atrial Fibrillation, Signs/Symptoms:Dyspnea and Shortness of Breath; Risk Factors:Current Smoker and Hypertension. Shock. Substance abuse. Hypoxia.  Sonographer:    Sheralyn Boatman RDCS Referring Phys: [id number] Cristopher Peru   Sonographer Comments: Technically difficult study due to poor echo  windows. Image acquisition challenging due to uncooperative patient. Patient moving throughout exam, had to constantly re-direct. IMPRESSIONS   1. Left ventricular ejection fraction, by estimation, is 60 to 65%. The left ventricle has normal function. The left ventricle has no regional wall motion abnormalities. Left ventricular diastolic parameters are consistent with Grade I diastolic dysfunction (impaired relaxation). 2. Right ventricular systolic function is normal. The right ventricular size is mildly enlarged. There is normal pulmonary artery systolic pressure. 3. Left atrial size was mildly dilated. 4. Right atrial size was mildly dilated. 5. The mitral valve is degenerative. No evidence of mitral valve regurgitation. No evidence of mitral stenosis. 6. The aortic valve is calcified. Aortic valve regurgitation is not visualized. Mild aortic valve stenosis. Aortic valve mean gradient measures 14.0 mmHg. Aortic valve Vmax measures 2.48 m/s. 7. The inferior vena cava is dilated in size with <50% respiratory variability, suggesting right atrial pressure of 15 mmHg.  Comparison(s): No prior Echocardiogram.  FINDINGS Left Ventricle: Left ventricular ejection fraction, by estimation, is 60 to 65%. The left ventricle has normal function. The left ventricle has no regional wall motion abnormalities. The left ventricular internal cavity size  was normal in size. There is no left ventricular hypertrophy. Left ventricular diastolic parameters are consistent with Grade I diastolic dysfunction (impaired relaxation).  Right Ventricle: The right ventricular size is mildly enlarged. No increase in right ventricular wall thickness. Right ventricular systolic function is normal. There is normal pulmonary artery systolic pressure. The tricuspid regurgitant velocity is 2.12 m/s, and with an assumed right atrial pressure of 15 mmHg, the estimated right ventricular systolic pressure is 33.0 mmHg.  Left Atrium:  Left atrial size was mildly dilated.  Right Atrium: Right atrial size was mildly dilated.  Pericardium: There is no evidence of pericardial effusion.  Mitral Valve: The mitral valve is degenerative in appearance. No evidence of mitral valve regurgitation. No evidence of mitral valve stenosis.  Tricuspid Valve: The tricuspid valve is not well visualized. Tricuspid valve regurgitation is trivial. No evidence of tricuspid stenosis.  Aortic Valve: The aortic valve is calcified. Aortic valve regurgitation is not visualized. Mild aortic stenosis is present. Aortic valve mean gradient measures 14.0 mmHg. Aortic valve peak gradient measures 24.6 mmHg. Aortic valve area, by VTI measures 1.49 cm.  Pulmonic Valve: The pulmonic valve was normal in structure. Pulmonic valve regurgitation is not visualized. No evidence of pulmonic stenosis.  Aorta: The ascending aorta was not well visualized and the aortic root is normal in size and structure.  Venous: The inferior vena cava is dilated in size with less than 50% respiratory variability, suggesting right atrial pressure of 15 mmHg.  IAS/Shunts: No atrial level shunt detected by color flow Doppler.   LEFT VENTRICLE PLAX 2D LVIDd:         4.10 cm     Diastology LVIDs:         2.40 cm     LV e' medial:    7.18 cm/s LV PW:         0.90 cm     LV E/e' medial:  14.8 LV IVS:        0.90 cm     LV e' lateral:   7.07 cm/s LVOT diam:     2.10 cm     LV E/e' lateral: 15.1 LV SV:         72 LV SV Index:   39 LVOT Area:     3.46 cm  LV Volumes (MOD) LV vol d, MOD A2C: 79.7 ml LV vol d, MOD A4C: 52.3 ml LV vol s, MOD A2C: 20.9 ml LV vol s, MOD A4C: 21.2 ml LV SV MOD A2C:     58.8 ml LV SV MOD A4C:     52.3 ml LV SV MOD BP:      43.2 ml  RIGHT VENTRICLE            IVC RV S prime:     8.92 cm/s  IVC diam: 2.20 cm TAPSE (M-mode): 2.0 cm  LEFT ATRIUM             Index        RIGHT ATRIUM           Index LA diam:        3.70 cm 2.02 cm/m   RA Area:      15.40 cm LA Vol (A2C):   41.7 ml 22.74 ml/m  RA Volume:   40.50 ml  22.09 ml/m LA Vol (A4C):   38.8 ml 21.16 ml/m LA Biplane Vol: 42.4 ml 23.12 ml/m AORTIC VALVE AV Area (Vmax):    1.49 cm AV Area (Vmean):   1.57  cm AV Area (VTI):     1.49 cm AV Vmax:           248.00 cm/s AV Vmean:          159.000 cm/s AV VTI:            0.485 m AV Peak Grad:      24.6 mmHg AV Mean Grad:      14.0 mmHg LVOT Vmax:         107.00 cm/s LVOT Vmean:        72.200 cm/s LVOT VTI:          0.209 m LVOT/AV VTI ratio: 0.43  AORTA Ao Root diam: 2.90 cm  MITRAL VALVE                TRICUSPID VALVE MV Area (PHT): 3.92 cm     TR Peak grad:   18.0 mmHg MV Decel Time: 194 msec     TR Vmax:        212.00 cm/s MV E velocity: 106.50 cm/s MV A velocity: 103.50 cm/s  SHUNTS MV E/A ratio:  1.03         Systemic VTI:  0.21 m Systemic Diam: 2.10 cm  Riley Lam MD Electronically signed by Riley Lam MD Signature Date/Time: 02/04/2023/9:14:01 AM    Final            Recent Labs: 02/06/2023: TSH 0.256 02/19/2023: BNP 214.9; Magnesium 1.5 02/20/2023: ALT 128; BUN 55; Creatinine, Ser 1.63; Hemoglobin 12.4; Platelets 216; Potassium 4.7; Sodium 135  Recent Lipid Panel    Component Value Date/Time   CHOL 158 12/25/2022 0937   TRIG 234 (H) 12/25/2022 0937   HDL 51 12/25/2022 0937   CHOLHDL 3.1 12/25/2022 0937   LDLCALC 69 12/25/2022 0937    History of Present Illness    77 year old female with the above past medical history including paroxysmal atrial fibrillation, diastolic dysfunction, aortic stenosis, hypertension, hyperlipidemia, prediabetes, COPD, tobacco use, and arthritis.  She was hospitalized from 02/02/2023-02/11/2023 in setting of septic shock, community-acquired pneumonia, acute respiratory failure, and new onset fibrillation.  She was treated with antibiotics, steroids, supplemental oxygen.  Cardiology was consulted in the setting of new onset atrial fibrillation with  RVR ER, elevated troponin, acute fluid volume overload.  Echocardiogram showed EF 60 to 65%, G1 DD, normal RV, mild aortic stenosis, mean gradient 14 mmHg.  She was treated with IV Lasix.  It was felt that her elevated troponin occurred likely in the setting of acute respiratory failure, septic shock.  She was started on metoprolol, diltiazem, and Eliquis.  She converted to sinus rhythm.  Labs revealed acute transaminitis likely due to septic shock, AKI, hypokalemia and hypomagnesemia.  Creatinine improved to 1.1 at discharge.  She was discharged home in stable condition on 02/11/2023 on 3 L home O2.  She returned to the ED on 02/20/2023 in the setting of hyperkalemia, AKI.  She was given IV fluids with improvement in her labs.  Potassium supplementation was discontinued.  Lasix was changed to every other day use.  She presents today for follow-up accompanied by her daughter.  Since her hospitalization she has done well from a cardiac standpoint.  She denies any palpitations, chest pain, dyspnea, edema, PND, orthopnea, weight gain.  She has quit smoking.  She has follow-up scheduled with her PCP today and will have repeat labs drawn at that time.  Overall, she reports feeling well denies any new concerns today.    Home Medications  Current Outpatient Medications  Medication Sig Dispense Refill   albuterol (VENTOLIN HFA) 108 (90 Base) MCG/ACT inhaler Inhale 2 puffs into the lungs every 6 (six) hours as needed. 18 g 2   apixaban (ELIQUIS) 5 MG TABS tablet Take 1 tablet (5 mg total) by mouth 2 (two) times daily. 60 tablet 3   CALCIUM PO Take 1 tablet by mouth daily.     Cholecalciferol (VITAMIN D3) 1000 units CAPS Take 2,000 Units by mouth.     diltiazem (CARDIZEM CD) 180 MG 24 hr capsule Take 1 capsule (180 mg total) by mouth daily. 30 capsule 3   Emollient (CERAVE DIABETICS DRY SKIN) CREA Apply 1 Application topically 2 (two) times daily. 236 mL 11   escitalopram (LEXAPRO) 20 MG tablet take 1 tablet  once daily. 30 tablet 1   furosemide (LASIX) 20 MG tablet Take 1 tablet (20 mg total) by mouth daily. (Patient taking differently: Take 20 mg by mouth daily as needed for fluid or edema.) 30 tablet 3   metoprolol tartrate (LOPRESSOR) 25 MG tablet Take 1 tablet (25 mg total) by mouth 2 (two) times daily. 60 tablet 3   naloxone (NARCAN) nasal spray 4 mg/0.1 mL As needed or respiratory depression or decreased mental status related to narcotic use / overdose 1 each 6   nystatin (MYCOSTATIN/NYSTOP) powder Apply 1 application. topically 3 (three) times daily. 60 g 0   pantoprazole (PROTONIX) 40 MG tablet Take 1 tablet (40 mg total) by mouth daily. 30 tablet 3   predniSONE (DELTASONE) 10 MG tablet Prednisone dosing: Take  Prednisone 40mg  (4 tabs) x 2 days, then taper to 30mg  (3 tabs) x 3 days, then 20mg  (2 tabs) x 3days, then 10mg  (1 tab) x 3days, then OFF. Dispense:  30 tabs, refills: None 26 tablet 0   traMADol (ULTRAM) 50 MG tablet Take 2 tablets (100 mg total) by mouth 2 (two) times daily. 120 tablet 0   traZODone (DESYREL) 50 MG tablet TAKE 1 TABLET BY MOUTH AT BEDTIME. 90 tablet 0   triamcinolone (KENALOG) 0.025 % cream Apply 1 Application topically 2 (two) times daily. 30 g 0   fluticasone-salmeterol (WIXELA INHUB) 250-50 MCG/ACT AEPB Inhale 1 puff into the lungs in the morning and at bedtime. (Patient not taking: Reported on 02/25/2023) 60 each 3   No current facility-administered medications for this visit.     Review of Systems    She denies chest pain, palpitations, dyspnea, pnd, orthopnea, n, v, dizziness, syncope, edema, weight gain, or early satiety. All other systems reviewed and are otherwise negative except as noted above.   Physical Exam    VS:  BP (!) 102/52 (BP Location: Left Arm, Patient Position: Sitting, Cuff Size: Normal)   Pulse 64   Ht 5\' 4"  (1.626 m)   Wt 153 lb 3.2 oz (69.5 kg)   SpO2 93%   BMI 26.30 kg/m   GEN: Well nourished, well developed, in no acute  distress. HEENT: normal. Neck: Supple, no JVD, carotid bruits, or masses. Cardiac: RRR, no murmurs, rubs, or gallops. No clubbing, cyanosis, edema.  Radials/DP/PT 2+ and equal bilaterally.  Respiratory:  Respirations regular and unlabored, clear to auscultation bilaterally. GI: Soft, nontender, nondistended, BS + x 4. MS: no deformity or atrophy. Skin: warm and dry, no rash. Neuro:  Strength and sensation are intact. Psych: Normal affect.  Accessory Clinical Findings    ECG personally reviewed by me today - EKG Interpretation Date/Time:  Tuesday February 25 2023 10:00:49 EDT Ventricular Rate:  64 PR Interval:  146 QRS Duration:  84 QT Interval:  418 QTC Calculation: 431 R Axis:   14  Text Interpretation: Sinus rhythm with frequent Premature ventricular complexes When compared with ECG of 20-Feb-2023 11:55, PREVIOUS ECG IS PRESENT Confirmed by Bernadene Person [phone]) on 02/25/2023 10:03:32 AM  - no acute changes.   Lab Results  Component Value Date   WBC 13.7 (H) 02/20/2023   HGB 12.4 02/20/2023   HCT 40.3 02/20/2023   MCV 106.9 (H) 02/20/2023   PLT 216 02/20/2023   Lab Results  Component Value Date   CREATININE 1.63 (H) 02/20/2023   BUN 55 (H) 02/20/2023   NA 135 02/20/2023   K 4.7 02/20/2023   CL 103 02/20/2023   CO2 22 02/20/2023   Lab Results  Component Value Date   ALT 128 (H) 02/20/2023   AST 57 (H) 02/20/2023   ALKPHOS 62 02/20/2023   BILITOT 1.2 02/20/2023   Lab Results  Component Value Date   CHOL 158 12/25/2022   HDL 51 12/25/2022   LDLCALC 69 12/25/2022   TRIG 234 (H) 12/25/2022   CHOLHDL 3.1 12/25/2022    Lab Results  Component Value Date   HGBA1C 6.2 (H) 02/03/2023    Assessment & Plan   1. Paroxysmal atrial fibrillation: Newly diagnosed.  Occurred in the setting of septic shock, community acquired pneumonia.  She converted to sinus rhythm during her hospitalization.  She was started on Eliquis (CHA2DS2VASc =5), metoprolol, and diltiazem.   Containing sinus rhythm.  She denies any recent palpitations. Denies bleeding. Continue metoprolol, diltiazem, Eliquis.  2. Elevated troponin: Likely occurred in the setting of acute respiratory failure, septic shock, atrial fibrillation with RVR.  Stable with no anginal symptoms.  No indication for ischemic evaluation.  3. Diastolic dysfunction: Echocardiogram during recent hospitalization showed EF 60 to 65%, G1 DD, normal RV, mild aortic stenosis, mean gradient 14 mmHg.  She was diuresed with IV Lasix during recent hospitalization in the setting of acute fluid volume overload in the setting of A-fib with RVR, sepsis.  Euvolemic and well compensated on exam.  Will change Lasix to 20 mg daily as needed for swelling, weight gain. Reviewed daily weights, sodium and fluid recommendations.  Also with recent hypo/hyperkalemia.  She will have follow-up labs with her PCP today.  For now, will discontinue potassium supplementation.  Continue metoprolol, Lasix as above.  4. Aortic stenosis: Mild on most recent echo.  Asymptomatic.  Continue to monitor with routine repeat echocardiogram, consider repeat echo in 02/2024.  5. Hypertension: BP well controlled. Continue current antihypertensive regimen.   6. Hyperlipidemia: LDL was 69 in 12/2022.  Rosuvastatin was held temporarily in the setting of acute transaminitis likely in the setting of sepsis.  She is pending repeat labs per PCP.  If liver enzymes stabilize, would recommend resumption of statin therapy with ongoing monitoring of liver enzymes.  7. COPD/tobacco use: Recent COPD exacerbation/acute respiratory failure in the setting of pneumonia with sepsis.  She was discharged home on 3 L home O2.  She is no longer requiring oxygen.  She has quit smoking.  I congratulated on her on this and encouraged ongoing cessation.  Following with pulmonology.  8. Disposition: Follow-up in 3-4 months.       Joylene Grapes, NP 02/25/2023, 12:32 PM

## 2023-02-25 NOTE — Patient Instructions (Addendum)
Medication Instructions:  Stop Potassium as directed Furosemide (Lasix) 20 mg take 1 tablet daily as needed for swelling and weight gain.   *If you need a refill on your cardiac medications before your next appointment, please call your pharmacy*   Lab Work: NONE ordered at this time of appointment    Testing/Procedures: NONE ordered at this time of appointment     Follow-Up: At [facility], you and your health needs are our priority.  As part of our continuing mission to provide you with exceptional heart care, we have created designated Provider Care Teams.  These Care Teams include your primary Cardiologist (physician) and Advanced Practice Providers (APPs -  Physician Assistants and Nurse Practitioners) who all work together to provide you with the care you need, when you need it.  We recommend signing up for the patient portal called "MyChart".  Sign up information is provided on this After Visit Summary.  MyChart is used to connect with patients for Virtual Visits (Telemedicine).  Patients are able to view lab/test results, encounter notes, upcoming appointments, etc.  Non-urgent messages can be sent to your provider as well.   To learn more about what you can do with MyChart, go to [email or web address].    Your next appointment:   3-4 month(s)  Provider:   Little Ishikawa, MD  or Bernadene Person, NP

## 2023-02-25 NOTE — Progress Notes (Signed)
Subjective:  Patient ID: Denise Underwood, female    DOB: 12-Sep-1945, 77 y.o.   MRN: [id number]  Patient Care Team: Sonny Masters, FNP as PCP - General (Family Medicine) Little Ishikawa, MD as PCP - Cardiology (Cardiology) Magrinat, Valentino Hue, MD (Inactive) as Consulting Physician (Oncology) Toma Deiters, MD (Internal Medicine) Michae Kava, MD as Consulting Physician (Internal Medicine)   Chief Complaint:  ER follow up  (02/20/2023 (6 hours)/[place] [facility] at [facility]- AKI)   HPI: Denise Underwood is a 77 y.o. female presenting on 02/25/2023 for ER follow up  (02/20/2023 (6 hours)/[place] [facility] at [facility]- AKI)   Discussed the use of AI scribe software for clinical note transcription with the patient, who gave verbal consent to proceed.  History of Present Illness   The patient, with a history of heart failure, kidney disease, and hypertension, was recently seen by a cardiologist and discharged from the emergency department (ED). She was given a small amount of fluid during her ED visit, which resulted in a slight improvement in kidney function, although it remained declined upon discharge. The patient denies any changes in urine output and denies any new onset of confusion or weakness. She reports a slight increase in leg and feet swelling.  The patient's calcium levels have fluctuated recently, with a high reading during a hospital stay, a normal reading at a previous appointment, and a low reading during the recent ED visit.  The patient denies any new or concerning symptoms such as abdominal pain, shortness of breath, chest pain, or increased leg swelling. She also reports having quit smoking and has not resumed the habit.      She has chronic pain due to arthritis and states she is out of her Meloxicam and Tramadol. Informed pt that the Meloxicam would not be restarted due to renal function. She was told to  stop the Meloxicam when discharged from the hospital the first time. States she just restarted it herself due to her knee pain.  PDMP was reviewed, no red flags present.   Relevant past medical, surgical, family, and social history reviewed and updated as indicated.  Allergies and medications reviewed and updated. Data reviewed: Chart in Epic.   Past Medical History:  Diagnosis Date   Anxiety    Arthritis    Hyperlipidemia    Hypertension    Osteopenia 07/04/2021   Prediabetes 07/09/2021   Psoriasis     Past Surgical History:  Procedure Laterality Date   ABDOMINAL HYSTERECTOMY     CHOLECYSTECTOMY     HERNIA REPAIR     KNEE SURGERY     TUBAL LIGATION      Social History   Socioeconomic History   Marital status: Single    Spouse name: Not on file   Number of children: 2   Years of education: Not on file   Highest education level: 9th grade  Occupational History   Occupation: Retired  Tobacco Use   Smoking status: Former    Current packs/day: 0.50    Average packs/day: 0.5 packs/day for 40.0 years (20.0 ttl pk-yrs)    Types: Cigarettes   Smokeless tobacco: Never   Tobacco comments:    Smoking since she was 29 or younger    02/25/2023 Patient stop smoking 01/2023  Vaping Use   Vaping status: Never Used  Substance and Sexual Activity   Alcohol use: No   Drug use: No  Sexual activity: Not Currently    Birth control/protection: Surgical  Other Topics Concern   Not on file  Social History Narrative   She lives alone on ground level apartment - has children.    Daughter, [name] is her main caregiver and means for transportation.   She also has an aide that comes in for 2 hours on weekdays to help with bathing, meals, meds etc   Social Determinants of Health   Financial Resource Strain: Low Risk  (11/07/2022)   Overall Financial Resource Strain (CARDIA)    Difficulty of Paying Living Expenses: Not hard at all  Food Insecurity: No Food Insecurity (02/18/2023)    Hunger Vital Sign    Worried About Running Out of Food in the Last Year: Never true    Ran Out of Food in the Last Year: Never true  Transportation Needs: No Transportation Needs (02/18/2023)   PRAPARE - Administrator, Civil Service (Medical): No    Lack of Transportation (Non-Medical): No  Physical Activity: Insufficiently Active (11/07/2022)   Exercise Vital Sign    Days of Exercise per Week: 3 days    Minutes of Exercise per Session: 30 min  Stress: No Stress Concern Present (11/07/2022)   [employer] of Occupational Health - Occupational Stress Questionnaire    Feeling of Stress : Not at all  Social Connections: Socially Isolated (11/07/2022)   Social Connection and Isolation Panel [NHANES]    Frequency of Communication with Friends and Family: More than three times a week    Frequency of Social Gatherings with Friends and Family: More than three times a week    Attends Religious Services: Never    Database administrator or Organizations: No    Attends Banker Meetings: Never    Marital Status: Widowed  Intimate Partner Violence: Not At Risk (02/04/2023)   Humiliation, Afraid, Rape, and Kick questionnaire    Fear of Current or Ex-Partner: No    Emotionally Abused: No    Physically Abused: No    Sexually Abused: No    Outpatient Encounter Medications as of 02/25/2023  Medication Sig   albuterol (VENTOLIN HFA) 108 (90 Base) MCG/ACT inhaler Inhale 2 puffs into the lungs every 6 (six) hours as needed.   apixaban (ELIQUIS) 5 MG TABS tablet Take 1 tablet (5 mg total) by mouth 2 (two) times daily.   CALCIUM PO Take 1 tablet by mouth daily.   Cholecalciferol (VITAMIN D3) 1000 units CAPS Take 2,000 Units by mouth.   diltiazem (CARDIZEM CD) 180 MG 24 hr capsule Take 1 capsule (180 mg total) by mouth daily.   Emollient (CERAVE DIABETICS DRY SKIN) CREA Apply 1 Application topically 2 (two) times daily.   escitalopram (LEXAPRO) 20 MG tablet take 1 tablet once  daily.   fluticasone-salmeterol (WIXELA INHUB) 250-50 MCG/ACT AEPB Inhale 1 puff into the lungs in the morning and at bedtime.   furosemide (LASIX) 20 MG tablet Take 1 tablet (20 mg total) by mouth daily. (Patient taking differently: Take 20 mg by mouth daily as needed for fluid or edema.)   meloxicam (MOBIC) 7.5 MG tablet Take 7.5 mg by mouth daily.   metoprolol tartrate (LOPRESSOR) 25 MG tablet Take 1 tablet (25 mg total) by mouth 2 (two) times daily.   naloxone (NARCAN) nasal spray 4 mg/0.1 mL As needed or respiratory depression or decreased mental status related to narcotic use / overdose   nystatin (MYCOSTATIN/NYSTOP) powder Apply 1 application. topically 3 (three) times daily.  pantoprazole (PROTONIX) 40 MG tablet Take 1 tablet (40 mg total) by mouth daily.   predniSONE (DELTASONE) 10 MG tablet Prednisone dosing: Take  Prednisone 40mg  (4 tabs) x 2 days, then taper to 30mg  (3 tabs) x 3 days, then 20mg  (2 tabs) x 3days, then 10mg  (1 tab) x 3days, then OFF. Dispense:  30 tabs, refills: None   triamcinolone (KENALOG) 0.025 % cream Apply 1 Application topically 2 (two) times daily.   [DISCONTINUED] traMADol (ULTRAM) 50 MG tablet Take 2 tablets (100 mg total) by mouth 2 (two) times daily.   [DISCONTINUED] traZODone (DESYREL) 50 MG tablet TAKE 1 TABLET BY MOUTH AT BEDTIME.   traMADol (ULTRAM) 50 MG tablet Take 2 tablets (100 mg total) by mouth 2 (two) times daily.   traZODone (DESYREL) 50 MG tablet Take 1 tablet (50 mg total) by mouth at bedtime.   [DISCONTINUED] potassium chloride SA (KLOR-CON M) 20 MEQ tablet Take 2 tablets (40 mEq total) by mouth daily. (Patient not taking: Reported on 02/25/2023)   No facility-administered encounter medications on file as of 02/25/2023.    No Known Allergies  Pertinent ROS per HPI, otherwise unremarkable      Objective:  BP 102/62   Pulse (!) 58   Temp (!) 96.6 F (35.9 C) (Temporal)   Ht 5\' 4"  (1.626 m)   Wt 154 lb 3.2 oz (69.9 kg)   SpO2 92%    BMI 26.47 kg/m    Wt Readings from Last 3 Encounters:  02/25/23 154 lb 3.2 oz (69.9 kg)  02/25/23 153 lb 3.2 oz (69.5 kg)  02/20/23 148 lb 5.9 oz (67.3 kg)    Physical Exam Vitals and nursing note reviewed.  Constitutional:      General: She is not in acute distress.    Appearance: Normal appearance. She is not ill-appearing, toxic-appearing or diaphoretic.  HENT:     Head: Normocephalic and atraumatic.     Nose: Nose normal.     Mouth/Throat:     Mouth: Mucous membranes are moist.     Pharynx: Oropharynx is clear.  Eyes:     Pupils: Pupils are equal, round, and reactive to light.  Cardiovascular:     Rate and Rhythm: Normal rate and regular rhythm.     Heart sounds: Normal heart sounds.  Pulmonary:     Effort: Pulmonary effort is normal.     Breath sounds: Normal breath sounds.  Abdominal:     General: Abdomen is protuberant. Bowel sounds are normal.     Palpations: Abdomen is soft.     Tenderness: There is no abdominal tenderness.     Hernia: A hernia is present. Hernia is present in the left inguinal area.    Musculoskeletal:     Right lower leg: No edema.     Left lower leg: No edema.  Skin:    General: Skin is warm and dry.     Capillary Refill: Capillary refill takes less than 2 seconds.  Neurological:     General: No focal deficit present.     Mental Status: She is alert and oriented to person, place, and time.     Motor: No weakness.     Gait: Gait normal.  Psychiatric:        Mood and Affect: Mood normal.        Behavior: Behavior normal.        Thought Content: Thought content normal.        Judgment: Judgment normal.    Physical Exam  ABDOMEN: Small, soft hernia near navel, non-painful.        Results for orders placed or performed during the hospital encounter of 02/20/23  Urine Culture   Specimen: Urine, Clean Catch  Result Value Ref Range   Specimen Description      URINE, CLEAN CATCH Performed at [facility], [address]  [place], [place] [address]    Special Requests      NONE Performed at [facility], [address] [place], [place] [address]    Culture MULTIPLE SPECIES PRESENT, SUGGEST RECOLLECTION (A)    Report Status 02/21/2023 FINAL   Comprehensive metabolic panel  Result Value Ref Range   Sodium 138 135 - 145 mmol/L   Potassium 5.4 (H) 3.5 - 5.1 mmol/L   Chloride 102 98 - 111 mmol/L   CO2 28 22 - 32 mmol/L   Glucose, Bld 117 (H) 70 - 99 mg/dL   BUN 59 (H) 8 - 23 mg/dL   Creatinine, Ser 0.86 (H) 0.44 - 1.00 mg/dL   Calcium 9.1 8.9 - 57.8 mg/dL   Total Protein 6.3 (L) 6.5 - 8.1 g/dL   Albumin 3.2 (L) 3.5 - 5.0 g/dL   AST 57 (H) 15 - 41 U/L   ALT 128 (H) 0 - 44 U/L   Alkaline Phosphatase 62 38 - 126 U/L   Total Bilirubin 1.2 0.3 - 1.2 mg/dL   GFR, Estimated 27 (L) >60 mL/min   Anion gap 8 5 - 15  CBC with Differential  Result Value Ref Range   WBC 13.7 (H) 4.0 - 10.5 K/uL   RBC 3.77 (L) 3.87 - 5.11 MIL/uL   Hemoglobin 12.4 12.0 - 15.0 g/dL   HCT 46.9 62.9 - 52.8 %   MCV 106.9 (H) 80.0 - 100.0 fL   MCH 32.9 26.0 - 34.0 pg   MCHC 30.8 30.0 - 36.0 g/dL   RDW 41.3 24.4 - 01.0 %   Platelets 216 150 - 400 K/uL   nRBC 0.0 0.0 - 0.2 %   Neutrophils Relative % 62 %   Neutro Abs 8.5 (H) 1.7 - 7.7 K/uL   Lymphocytes Relative 27 %   Lymphs Abs 3.7 0.7 - 4.0 K/uL   Monocytes Relative 8 %   Monocytes Absolute 1.2 (H) 0.1 - 1.0 K/uL   Eosinophils Relative 2 %   Eosinophils Absolute 0.2 0.0 - 0.5 K/uL   Basophils Relative 0 %   Basophils Absolute 0.0 0.0 - 0.1 K/uL   Immature Granulocytes 1 %   Abs Immature Granulocytes 0.08 (H) 0.00 - 0.07 K/uL  Urinalysis, Routine w reflex microscopic -Urine, Clean Catch  Result Value Ref Range   Color, Urine YELLOW YELLOW   APPearance CLOUDY (A) CLEAR   Specific Gravity, Urine 1.019 1.005 - 1.030   pH 5.0 5.0 - 8.0   Glucose, UA NEGATIVE NEGATIVE mg/dL   Hgb urine dipstick SMALL (A) NEGATIVE   Bilirubin Urine NEGATIVE NEGATIVE   Ketones, ur NEGATIVE  NEGATIVE mg/dL   Protein, ur NEGATIVE NEGATIVE mg/dL   Nitrite NEGATIVE NEGATIVE   Leukocytes,Ua MODERATE (A) NEGATIVE   RBC / HPF 0-5 0 - 5 RBC/hpf   WBC, UA >50 0 - 5 WBC/hpf   Bacteria, UA RARE (A) NONE SEEN   Squamous Epithelial / HPF 11-20 0 - 5 /HPF   Mucus PRESENT    Hyaline Casts, UA PRESENT   Basic metabolic panel  Result Value Ref Range   Sodium 135 135 - 145 mmol/L   Potassium  4.7 3.5 - 5.1 mmol/L   Chloride 103 98 - 111 mmol/L   CO2 22 22 - 32 mmol/L   Glucose, Bld 100 (H) 70 - 99 mg/dL   BUN 55 (H) 8 - 23 mg/dL   Creatinine, Ser 7.82 (H) 0.44 - 1.00 mg/dL   Calcium 8.6 (L) 8.9 - 10.3 mg/dL   GFR, Estimated 32 (L) >60 mL/min   Anion gap 10 5 - 15       Pertinent labs & imaging results that were available during my care of the patient were reviewed by me and considered in my medical decision making.  Assessment & Plan:  Trinadee was seen today for er follow up .  Diagnoses and all orders for this visit:  AKI (acute kidney injury) (HCC) -     CBC with Differential/Platelet -     CMP14+EGFR  Primary osteoarthritis involving multiple joints -     traMADol (ULTRAM) 50 MG tablet; Take 2 tablets (100 mg total) by mouth 2 (two) times daily.  Controlled substance agreement signed -     traMADol (ULTRAM) 50 MG tablet; Take 2 tablets (100 mg total) by mouth 2 (two) times daily.  Difficulty sleeping -     traZODone (DESYREL) 50 MG tablet; Take 1 tablet (50 mg total) by mouth at bedtime.  Moderate episode of recurrent major depressive disorder (HCC) -     traZODone (DESYREL) 50 MG tablet; Take 1 tablet (50 mg total) by mouth at bedtime.     Assessment and Plan    Chronic Heart Failure Mild peripheral edema without dyspnea. Discussed the importance of monitoring for weight gain and signs of fluid overload. Lasix use adjusted to as needed based on symptoms. -Continue Lasix as needed for swelling. -Monitor weight for sudden increase (3 lbs overnight or 5 lbs in a  week) and seek medical attention if this occurs.  Renal Function Recent decline in kidney function noted during ED visit. No changes in urine output reported. -Discontinue potassium supplementation. Stop Meloxicam.  -Recheck kidney function today.  Hypocalcemia Recent low calcium level likely secondary to IV fluid administration in the ED. -Continue calcium supplementation. -Recheck calcium level today.  Arthritis Discussed discontinuation of Meloxicam due to potential renal side effects. -Discontinue Meloxicam. -Start Tylenol Arthritis twice daily as needed for pain. -Consider use of topical agents like Biofreeze for pain relief.  Inguinal Hernia Small, soft, non-tender hernia noted on examination in the left groin area. The patient denies any discomfort associated with this hernia.  -No intervention needed at this time.  Follow-up Regularly scheduled appointment next month for medication refills.          Continue all other maintenance medications.  Follow up plan: Return if symptoms worsen or fail to improve.   Continue healthy lifestyle choices, including diet (rich in fruits, vegetables, and lean proteins, and low in salt and simple carbohydrates) and exercise (at least 30 minutes of moderate physical activity daily).   The above assessment and management plan was discussed with the patient. The patient verbalized understanding of and has agreed to the management plan. Patient is aware to call the clinic if they develop any new symptoms or if symptoms persist or worsen. Patient is aware when to return to the clinic for a follow-up visit. Patient educated on when it is appropriate to go to the emergency department.   Kari Baars, FNP-C [facility] [phone]

## 2023-02-26 LAB — CMP14+EGFR
ALT: 51 [IU]/L — ABNORMAL HIGH (ref 0–32)
AST: 26 [IU]/L (ref 0–40)
Albumin: 3.7 g/dL — ABNORMAL LOW (ref 3.8–4.8)
Alkaline Phosphatase: 63 [IU]/L (ref 44–121)
BUN/Creatinine Ratio: 23 (ref 12–28)
BUN: 40 mg/dL — ABNORMAL HIGH (ref 8–27)
Bilirubin Total: 0.5 mg/dL (ref 0.0–1.2)
CO2: 25 mmol/L (ref 20–29)
Calcium: 8.9 mg/dL (ref 8.7–10.3)
Chloride: 103 mmol/L (ref 96–106)
Creatinine, Ser: 1.76 mg/dL — ABNORMAL HIGH (ref 0.57–1.00)
Globulin, Total: 2.1 g/dL (ref 1.5–4.5)
Glucose: 204 mg/dL — ABNORMAL HIGH (ref 70–99)
Potassium: 4.8 mmol/L (ref 3.5–5.2)
Sodium: 141 mmol/L (ref 134–144)
Total Protein: 5.8 g/dL — ABNORMAL LOW (ref 6.0–8.5)
eGFR: 29 mL/min/{1.73_m2} — ABNORMAL LOW (ref >59)

## 2023-02-26 LAB — CBC WITH DIFFERENTIAL/PLATELET
Basophils Absolute: 0 10*3/uL (ref 0.0–0.2)
Basos: 0 %
EOS (ABSOLUTE): 0.1 10*3/uL (ref 0.0–0.4)
Eos: 0 %
Hematocrit: 34 % (ref 34.0–46.6)
Hemoglobin: 11 g/dL — ABNORMAL LOW (ref 11.1–15.9)
Immature Grans (Abs): 0.1 10*3/uL (ref 0.0–0.1)
Immature Granulocytes: 1 %
Lymphocytes Absolute: 1.2 10*3/uL (ref 0.7–3.1)
Lymphs: 9 %
MCH: 33.6 pg — ABNORMAL HIGH (ref 26.6–33.0)
MCHC: 32.4 g/dL (ref 31.5–35.7)
MCV: 104 fL — ABNORMAL HIGH (ref 79–97)
Monocytes Absolute: 0.4 10*3/uL (ref 0.1–0.9)
Monocytes: 3 %
Neutrophils Absolute: 11 10*3/uL — ABNORMAL HIGH (ref 1.4–7.0)
Neutrophils: 87 %
Platelets: 186 10*3/uL (ref 150–450)
RBC: 3.27 x10E6/uL — ABNORMAL LOW (ref 3.77–5.28)
RDW: 12.5 % (ref 11.7–15.4)
WBC: 12.8 10*3/uL — ABNORMAL HIGH (ref 3.4–10.8)

## 2023-03-02 NOTE — Progress Notes (Unsigned)
Denise Underwood, female    DOB: 15-Jul-1945    MRN: [id number]   Brief patient profile:  77  yowf 02/02/23 quit smoking  referred to pulmonary clinic in [facility]  03/03/2023 by [facility]  for post hosp f/u    Admit date:     02/02/2023  Discharge date: 02/11/23      Recommendations at discharge:    Started on Lasix 20 mg daily, potassium 40 mEq daily Started on Cardizem 180 mg daily, metoprolol 25 mg twice daily Continue prednisone with taper, 40 mg for 2 days, 30 mg for 3 days, 20 mg for 3 days, 10 mg for 3 days then off Continue albuterol inhaler as needed for shortness of breath, wheezing, started on maintenance Wixela 250-50 one puff twice daily.   Outpatient pulm and cardiology follow-ups as scheduled  O2 saturation qualifications Patient Saturations on Room Air at Rest = 84% Patient Saturations on Room Air while Ambulating = 81% Patient Saturations on 3 Liters of oxygen while Ambulating = 92%   Discharge Diagnoses:     Septic shock (HCC)   Acute respiratory failure with hypoxia and hypercapnia (HCC)   New onset atrial fibrillation (HCC) Elevated troponins   Pressure injury of skin   AKI (acute kidney injury) (HCC)   Atrial fibrillation with RVR (HCC)   Shock liver Hypokalemia   Acute on chronic diastolic heart failure (HCC)   COPD with acute exacerbation (HCC)   Acute metabolic encephalopathy-resolved   Community acquired pneumonia       Hospital Course:   Patient is a 77 year old female with HTN, HLP, prediabetes, arthritis, COPD, tobacco use presented to [name] ED on 02/02/2023 with shortness of breath.  Patient had noted 1 to 2 days of worsening shortness of breath, was found hypoxic with O2 sat 76% by EMS, dyspneic, tachypneic and unable to even stand at home. In ED, was found to have new atrial fibrillation with RVR, was placed on O2, Cardizem drip was started, patient converted to NSR. Subsequently noted to be hypotensive, received IV fluids and  eventually required vasopressor support.  Patient was transferred and by PCCM to ICU on 02/03/2023   CTAP showed right lower lung infiltrates, small right pleural effusion.  ABG showed pH 7.15, pCO2 96, pO2 161, was placed on BiPAP and IV antibiotics. WBCs 34.5, creatinine 2.21, AST 760, ALT 445, troponin 84> 117.  BNP 697.       Assessment and Plan:     Septic shock (HCC), community-acquired pneumonia -CT abdomen pelvis showed right lower lung infiltrates, UA negative for UTI, no intra-abdominal pathology. -Chest x-ray 02/04/2023 with worsening RLL infiltrates -Off vasopressors, blood cultures 9/22 negative so far -Flu, RSV, COVID-negative, respiratory virus panel negative, urine strept antigen negative, Legionella antigen negative -Completed IV ceftriaxone and Zithromax x 5 days -Sepsis physiology resolved       Acute respiratory failure with hypoxia and hypercapnia (HCC) Community-acquired pneumonia, underlying COPD, tobacco use, RLL pleural effusion -Not on O2 at baseline -Pulmonology following, continue Pulmicort, Brovana, Yupelri, Xopenex, flutter valve -Currently on 2 L O2 via nasal cannula, wean as tolerated, will need home O2 evaluation -IV steroid weaned off, pulmonology recommending prednisone taper  - may need nocturnal BiPAP for home however she is noncompliant with BiPAP here inpatient, refusing.  So unlikely she will use it at home. -Outpatient pulmonology appointment scheduled with Dr. Sherene Sires -Home O2 evaluation completed, needs 3 L O2      New onset atrial fibrillation with RVR (HCC),  elevated troponins Volume overload with acute diastolic CHF -Elevated troponins likely due to demand ischemia from respiratory failure, septic shock -2D echo showed EF 60 to 65%, G1 DD, normal right ventricular systolic function -Patient was placed on IV Lasix, transitioned to oral Lasix 20 mg daily  -CHA2DS2-VASc 5, continue apixaban -Continue diltiazem, consolidated to 180 mg daily,  metoprolol 25 mg twice daily   Acute metabolic encephalopathy -Likely due to hypercarbia ABG showed pH 7.3, pCO2 78, pO2 97 -Ammonia level normal 25, B12 normal, folate normal, TSH 0.2 -Resolved, alert and oriented x 3, mental status close to baseline   Elevated acute transaminitis, shock liver -Likely due to septic shock, AST 768, ALT 445 on admission -LFTs improving, hold statin       AKI (acute kidney injury) (HCC) -Creatinine 2.21 on admission, was 1.1 on 12/25/2022 -Improved, at baseline 1.1   Hypokalemia, hypomagnesemia -Replaced aggressively, patient will discharge on potassium supplementation while on Lasix   Pressure injury documentation -Lower coccyx medial stage I, POA -Wound care per nursing   Hyperlipidemia -Continue to hold statins due to acute transaminitis   Hypertension -Continue Cardizem, beta-blocker, BP stable   Obesity Estimated body mass index is 27.06 kg/m as calculated from the following:   Height as of this encounter: 5\' 4"  (1.626 m).   Weight as of this encounter: 71.5 kg.       Pain control - [employer] Controlled Substance Reporting System database was reviewed. and patient was instructed, not to drive, operate heavy machinery, perform activities at heights, swimming or participation in water activities or provide baby-sitting services while on Pain, Sleep and Anxiety Medications; until their outpatient Physician has advised to do so again. Also recommended to not to take more than prescribed Pain, Sleep and Anxiety Medications.  Consultants: Cardiology, PCCM Procedures performed: 2D echo Disposition: Home Diet recommendation:  Discharge Diet Orders (From admission, onward)        Start     Ordered    02/11/23 0000   Diet - low sodium heart healthy        02/11/23 1119             History of Present Illness  03/03/2023  Pulmonary/ 1st office eval/ Shadrach Bartunek / [place] [facility]  Chief Complaint  Patient presents with   Establish Care   Dyspnea:  [employer] shopping used [facility] sticker for knees and used walking  Rarely needed rx for flares of cough / wheeze  Cough: none  Sleep: flat bed / one pillow  SABA use: none now on wixella /no neb 02: staying above 90% even walking  Post hosp able to walk across across parking lot and into Dr's office  Coiugh none     Outpatient Medications Prior to Visit  Medication Sig Dispense Refill   albuterol (VENTOLIN HFA) 108 (90 Base) MCG/ACT inhaler Inhale 2 puffs into the lungs every 6 (six) hours as needed. 18 g 2   apixaban (ELIQUIS) 5 MG TABS tablet Take 1 tablet (5 mg total) by mouth 2 (two) times daily. 60 tablet 3   CALCIUM PO Take 1 tablet by mouth daily.     Cholecalciferol (VITAMIN D3) 1000 units CAPS Take 2,000 Units by mouth.     diltiazem (CARDIZEM CD) 180 MG 24 hr capsule Take 1 capsule (180 mg total) by mouth daily. 30 capsule 3   Emollient (CERAVE DIABETICS DRY SKIN) CREA Apply 1 Application topically 2 (two) times daily. 236 mL 11   escitalopram (LEXAPRO) 20 MG tablet  take 1 tablet once daily. 30 tablet 1   fluticasone-salmeterol (WIXELA INHUB) 250-50 MCG/ACT AEPB Inhale 1 puff into the lungs in the morning and at bedtime. 60 each 3   furosemide (LASIX) 20 MG tablet Last dose was )ct 16th  30 tablet 3   meloxicam (MOBIC) 7.5 MG tablet Stopped one week prior to ov      metoprolol tartrate (LOPRESSOR) 25 MG tablet Take 1 tablet (25 mg total) by mouth 2 (two) times daily. 60 tablet 3   naloxone (NARCAN) nasal spray 4 mg/0.1 mL As needed or respiratory depression or decreased mental status related to narcotic use / overdose 1 each 6   nystatin (MYCOSTATIN/NYSTOP) powder Apply 1 application. topically 3 (three) times daily. 60 g 0   pantoprazole (PROTONIX) 40 MG tablet Take 1 tablet (40 mg total) by mouth daily. 30 tablet 3   traMADol (ULTRAM) 50 MG tablet Take 2 tablets (100 mg total) by mouth 2 (two) times daily. 120 tablet 0   traZODone (DESYREL) 50 MG tablet Take 1 tablet (50  mg total) by mouth at bedtime. 90 tablet 0   triamcinolone (KENALOG) 0.025 % cream Apply 1 Application topically 2 (two) times daily. 30 g 0   predniSONE (DELTASONE) 10 MG tablet Finish 03/04/23  26 tablet 0   No facility-administered medications prior to visit.    Past Medical History:  Diagnosis Date   Anxiety    Arthritis    Hyperlipidemia    Hypertension    Osteopenia 07/04/2021   Prediabetes 07/09/2021   Psoriasis       Objective:     BP (!) 89/56   Pulse 73   Ht 5\' 4"  (1.626 m)   Wt 154 lb (69.9 kg)   SpO2 91%   BMI 26.43 kg/m   SpO2: 91 % RA  Amb wf nad   HEENT : Oropharynx  clear        NECK :  without  apparent JVD/ palpable Nodes/TM    LUNGS: no acc muscle use,  Nl contour chest which is clear to A and P bilaterally without cough on insp or exp maneuvers   CV:  RRR  no s3 or murmur or increase in P2, and no edema   ABD:  soft and nontender with nl inspiratory excursion in the supine position. No bruits or organomegaly appreciated   MS:  Nl gait/ ext warm without deformities Or obvious joint restrictions  calf tenderness, cyanosis or clubbing    SKIN: warm and dry without lesions    NEURO:  alert, approp, nl sensorium with  no motor or cerebellar deficits apparent.   CXR PA and Lateral:   03/03/2023 :    I personally reviewed images and impression is as follows:     Mild cm/ improved aeration bilaterally vs priors                   Assessment   No problem-specific Assessment & Plan notes found for this encounter.     Sandrea Hughs, MD 03/03/2023

## 2023-03-03 ENCOUNTER — Ambulatory Visit (INDEPENDENT_AMBULATORY_CARE_PROVIDER_SITE_OTHER): Payer: Medicare HMO | Admitting: Internal Medicine

## 2023-03-03 ENCOUNTER — Encounter: Payer: Self-pay | Admitting: Internal Medicine

## 2023-03-03 ENCOUNTER — Ambulatory Visit (HOSPITAL_COMMUNITY)
Admission: RE | Admit: 2023-03-03 | Discharge: 2023-03-03 | Disposition: A | Discharge status: Home / Self Care | Payer: Medicare HMO | Source: Ambulatory Visit | Attending: Internal Medicine | Admitting: Internal Medicine

## 2023-03-03 VITALS — BP 89/56 | HR 73 | Ht 64.0 in | Wt 154.0 lb

## 2023-03-03 DIAGNOSIS — J189 Pneumonia, unspecified organism: Secondary | ICD-10-CM

## 2023-03-03 DIAGNOSIS — I700 Atherosclerosis of aorta: Secondary | ICD-10-CM | POA: Diagnosis not present

## 2023-03-03 DIAGNOSIS — F17200 Nicotine dependence, unspecified, uncomplicated: Secondary | ICD-10-CM | POA: Diagnosis not present

## 2023-03-03 DIAGNOSIS — R0609 Other forms of dyspnea: Secondary | ICD-10-CM

## 2023-03-03 DIAGNOSIS — J9602 Acute respiratory failure with hypercapnia: Secondary | ICD-10-CM | POA: Diagnosis not present

## 2023-03-03 DIAGNOSIS — R0989 Other specified symptoms and signs involving the circulatory and respiratory systems: Secondary | ICD-10-CM | POA: Diagnosis not present

## 2023-03-03 DIAGNOSIS — J90 Pleural effusion, not elsewhere classified: Secondary | ICD-10-CM | POA: Diagnosis not present

## 2023-03-03 DIAGNOSIS — J9601 Acute respiratory failure with hypoxia: Secondary | ICD-10-CM

## 2023-03-03 DIAGNOSIS — J441 Chronic obstructive pulmonary disease with (acute) exacerbation: Secondary | ICD-10-CM | POA: Diagnosis not present

## 2023-03-03 NOTE — Addendum Note (Signed)
Addended by: Angela Adam on: 03/03/2023 09:57 AM   Modules accepted: Orders

## 2023-03-03 NOTE — Patient Instructions (Addendum)
Plan A = Automatic = Always=    Wixella reduce to once  day for 2 weeks then stop   Plan B = Backup (to supplement plan A, not to replace it) Only use your albuterol inhaler as a rescue medication to be used if you can't catch your breath by resting or doing a relaxed purse lip breathing pattern.  - The less you use it, the better it will work when you need it. - Ok to use the inhaler up to 2 puffs  every 4 hours if you must but call for appointment if use goes up over your usual need - Don't leave home without it !!  (think of it like the spare tire for your car)    My office will be contacting you by phone for referral for pfts  - try not to take any albuterol than day  - if you don't hear back from my office within one week please call us back or notify us thru MyChart and we'll address it right away  Leave the lopressor (metaprolol) for now and don't take it until bp over 110   Pulmonary follow up is as needed

## 2023-03-04 DIAGNOSIS — R0609 Other forms of dyspnea: Secondary | ICD-10-CM | POA: Insufficient documentation

## 2023-03-04 NOTE — Assessment & Plan Note (Signed)
Quit smoking 02/02/23 - Echo  02/04/23 with nl sys function/ G1 diastolic dysfunction and mild LAE RAE    Her illness was acute without features of copd clincally prior no none on exam now.  However, she does appear to be over diuresed at present and cardizem plus lopressor may be redundant.  Since bp so low and fatigue is the main concern rec  1) hold lasix in the absence of significant leg swelling 2) hold lopressor unless bp over 110  3) continue cardizem 4) serial creat off all nsaids  5) f/u pft's ordered  She can wean wixella off in a few weeks and start back if note decline in ex tol or increased need for saba.   - The proper method of use, as well as anticipated side effects, of a metered-dose inhaler were discussed and demonstrated to the patient using teach back method using an empty hfa cannister for both hfa and dpi technique.  Pulmonary f/u is prn   Advised:   I reviewed the Fletcher curve with the patient that basically indicates that  if you quit smoking when your best day FEV1 is still well preserved (as is likely still   the case here)  it is highly unlikely you will progress to severe disease and informed the patient there was  no medication on the market that has proven to alter the curve/ its downward trajectory  or the likelihood of progression of their disease(unlike other chronic medical conditions such as atheroclerosis where we do think we can change the natural hx with risk reducing meds)    Therefore stopping smoking and maintaining abstinence are  the most important aspects of her  care, not choice of inhalers or for that matter, Pulmonary doctors.   Treatment other than smoking cessation  is entirely directed by severity of symptoms and focused also on reducing exacerbations, not attempting to change the natural history of the disease. Should either issue become problematic, then escalation of maintenance and prn resp meds may be warranted "the punishment needs to fit  the crime"           Each maintenance medication was reviewed in detail including emphasizing most importantly the difference between maintenance and prns and under what circumstances the prns are to be triggered using an action plan format where appropriate.  Total time for H and P, chart review, counseling, reviewing hfa/dpi/pulse ox  device(s) and generating customized AVS unique to this office visit / same day charting 45  min with pt new to me

## 2023-03-06 ENCOUNTER — Other Ambulatory Visit: Payer: Self-pay | Admitting: Family Medicine

## 2023-03-06 DIAGNOSIS — R062 Wheezing: Secondary | ICD-10-CM

## 2023-03-07 ENCOUNTER — Ambulatory Visit (INDEPENDENT_AMBULATORY_CARE_PROVIDER_SITE_OTHER): Payer: Medicare HMO

## 2023-03-07 DIAGNOSIS — E559 Vitamin D deficiency, unspecified: Secondary | ICD-10-CM | POA: Diagnosis not present

## 2023-03-07 DIAGNOSIS — J441 Chronic obstructive pulmonary disease with (acute) exacerbation: Secondary | ICD-10-CM

## 2023-03-07 DIAGNOSIS — K921 Melena: Secondary | ICD-10-CM | POA: Diagnosis not present

## 2023-03-07 DIAGNOSIS — I4891 Unspecified atrial fibrillation: Secondary | ICD-10-CM

## 2023-03-07 DIAGNOSIS — F17210 Nicotine dependence, cigarettes, uncomplicated: Secondary | ICD-10-CM

## 2023-03-07 DIAGNOSIS — I110 Hypertensive heart disease with heart failure: Secondary | ICD-10-CM | POA: Diagnosis not present

## 2023-03-07 DIAGNOSIS — I5033 Acute on chronic diastolic (congestive) heart failure: Secondary | ICD-10-CM | POA: Diagnosis not present

## 2023-03-07 DIAGNOSIS — J9601 Acute respiratory failure with hypoxia: Secondary | ICD-10-CM

## 2023-03-07 DIAGNOSIS — M859 Disorder of bone density and structure, unspecified: Secondary | ICD-10-CM | POA: Diagnosis not present

## 2023-03-07 DIAGNOSIS — J9602 Acute respiratory failure with hypercapnia: Secondary | ICD-10-CM | POA: Diagnosis not present

## 2023-03-07 DIAGNOSIS — R7303 Prediabetes: Secondary | ICD-10-CM | POA: Diagnosis not present

## 2023-03-07 DIAGNOSIS — M150 Primary generalized (osteo)arthritis: Secondary | ICD-10-CM | POA: Diagnosis not present

## 2023-03-10 ENCOUNTER — Other Ambulatory Visit: Payer: Medicare HMO

## 2023-03-10 DIAGNOSIS — N179 Acute kidney failure, unspecified: Secondary | ICD-10-CM

## 2023-03-11 LAB — CBC WITH DIFFERENTIAL/PLATELET
Basophils Absolute: 0 10*3/uL (ref 0.0–0.2)
Basos: 0 %
EOS (ABSOLUTE): 0.4 10*3/uL (ref 0.0–0.4)
Eos: 3 %
Hematocrit: 36.6 % (ref 34.0–46.6)
Hemoglobin: 11.7 g/dL (ref 11.1–15.9)
Immature Grans (Abs): 0.1 10*3/uL (ref 0.0–0.1)
Immature Granulocytes: 1 %
Lymphocytes Absolute: 3.7 10*3/uL — ABNORMAL HIGH (ref 0.7–3.1)
Lymphs: 29 %
MCH: 33.1 pg — ABNORMAL HIGH (ref 26.6–33.0)
MCHC: 32 g/dL (ref 31.5–35.7)
MCV: 104 fL — ABNORMAL HIGH (ref 79–97)
Monocytes Absolute: 1.3 10*3/uL — ABNORMAL HIGH (ref 0.1–0.9)
Monocytes: 10 %
Neutrophils Absolute: 7.4 10*3/uL — ABNORMAL HIGH (ref 1.4–7.0)
Neutrophils: 57 %
Platelets: 163 10*3/uL (ref 150–450)
RBC: 3.53 x10E6/uL — ABNORMAL LOW (ref 3.77–5.28)
RDW: 12.2 % (ref 11.7–15.4)
WBC: 12.9 10*3/uL — ABNORMAL HIGH (ref 3.4–10.8)

## 2023-03-11 LAB — BMP8+EGFR
BUN/Creatinine Ratio: 21 (ref 12–28)
BUN: 37 mg/dL — ABNORMAL HIGH (ref 8–27)
CO2: 21 mmol/L (ref 20–29)
Calcium: 9.6 mg/dL (ref 8.7–10.3)
Chloride: 104 mmol/L (ref 96–106)
Creatinine, Ser: 1.77 mg/dL — ABNORMAL HIGH (ref 0.57–1.00)
Glucose: 99 mg/dL (ref 70–99)
Potassium: 4.8 mmol/L (ref 3.5–5.2)
Sodium: 142 mmol/L (ref 134–144)
eGFR: 29 mL/min/{1.73_m2} — ABNORMAL LOW (ref >59)

## 2023-03-15 DIAGNOSIS — I5033 Acute on chronic diastolic (congestive) heart failure: Secondary | ICD-10-CM | POA: Diagnosis not present

## 2023-03-15 DIAGNOSIS — J441 Chronic obstructive pulmonary disease with (acute) exacerbation: Secondary | ICD-10-CM | POA: Diagnosis not present

## 2023-03-15 DIAGNOSIS — G9341 Metabolic encephalopathy: Secondary | ICD-10-CM | POA: Diagnosis not present

## 2023-03-15 DIAGNOSIS — I4891 Unspecified atrial fibrillation: Secondary | ICD-10-CM | POA: Diagnosis not present

## 2023-03-19 ENCOUNTER — Ambulatory Visit: Payer: Medicare HMO | Admitting: Family Medicine

## 2023-03-28 ENCOUNTER — Ambulatory Visit (INDEPENDENT_AMBULATORY_CARE_PROVIDER_SITE_OTHER): Payer: Medicare HMO | Admitting: Family Medicine

## 2023-03-28 ENCOUNTER — Encounter: Payer: Self-pay | Admitting: Family Medicine

## 2023-03-28 VITALS — BP 118/74 | HR 76 | Temp 97.0°F | Ht 64.0 in | Wt 163.8 lb

## 2023-03-28 DIAGNOSIS — I129 Hypertensive chronic kidney disease with stage 1 through stage 4 chronic kidney disease, or unspecified chronic kidney disease: Secondary | ICD-10-CM

## 2023-03-28 DIAGNOSIS — F331 Major depressive disorder, recurrent, moderate: Secondary | ICD-10-CM | POA: Diagnosis not present

## 2023-03-28 DIAGNOSIS — I10 Essential (primary) hypertension: Secondary | ICD-10-CM | POA: Diagnosis not present

## 2023-03-28 DIAGNOSIS — M150 Primary generalized (osteo)arthritis: Secondary | ICD-10-CM

## 2023-03-28 DIAGNOSIS — E782 Mixed hyperlipidemia: Secondary | ICD-10-CM | POA: Diagnosis not present

## 2023-03-28 DIAGNOSIS — E559 Vitamin D deficiency, unspecified: Secondary | ICD-10-CM | POA: Diagnosis not present

## 2023-03-28 DIAGNOSIS — Z79899 Other long term (current) drug therapy: Secondary | ICD-10-CM

## 2023-03-28 DIAGNOSIS — D729 Disorder of white blood cells, unspecified: Secondary | ICD-10-CM | POA: Diagnosis not present

## 2023-03-28 DIAGNOSIS — L409 Psoriasis, unspecified: Secondary | ICD-10-CM

## 2023-03-28 DIAGNOSIS — N1831 Chronic kidney disease, stage 3a: Secondary | ICD-10-CM | POA: Insufficient documentation

## 2023-03-28 MED ORDER — TRAMADOL HCL 50 MG PO TABS: 100.0000 mg | ORAL_TABLET | Freq: Two times a day (BID) | ORAL | 0 refills | Status: AC

## 2023-03-28 MED ORDER — TRAMADOL HCL 50 MG PO TABS
100.0000 mg | ORAL_TABLET | Freq: Two times a day (BID) | ORAL | 0 refills | Status: AC
Start: 2023-05-27 — End: 2023-06-26

## 2023-03-28 MED ORDER — TRAMADOL HCL 50 MG PO TABS
100.0000 mg | ORAL_TABLET | Freq: Two times a day (BID) | ORAL | 0 refills | Status: AC
Start: 2023-04-27 — End: 2023-05-27

## 2023-03-28 NOTE — Progress Notes (Signed)
Subjective:  Patient ID: Denise Underwood, female    DOB: [birth year], 77 y.o.   MRN: [id number]  Patient Care Team: Sonny Masters, FNP as PCP - General (Family Medicine) Little Ishikawa, MD as PCP - Cardiology (Cardiology) Magrinat, Valentino Hue, MD (Inactive) as Consulting Physician (Oncology) Toma Deiters, MD (Internal Medicine) Michae Kava, MD as Consulting Physician (Internal Medicine)   Chief Complaint:  Pain Management   HPI: Denise Underwood is a 77 y.o. female presenting on 03/28/2023 for Pain Management   Discussed the use of AI scribe software for clinical note transcription with the patient, who gave verbal consent to proceed.  History of Present Illness   The patient, with a history of psoriasis, osteoarthritis, and COPD, presents for pain management. They report a recent flare-up of psoriasis, attributing it to one of their medications. They have been taking tramadol 100mg  twice daily for pain management, despite previous discussions about reducing the dose due to a history of respiratory failure. The patient denies any recent respiratory issues.  The patient also reports issues with their knees, particularly the right one, which swells up, especially after therapy. The pain level, even with medication, is reported to be around 6 or 7 out of 10. They have previously received injections for this, which have been helpful, but it has not been long enough since the last one for another to be administered.  The patient is also on oxygen therapy, but reports that their oxygen levels have been good and they have been able to remove the oxygen at times. They express a desire to stop the oxygen therapy completely.  The patient also has a history of depression, which they report as being "about normal" at the moment. They mention a recent change in their living situation, which seems to have had a positive impact on their mood.  Lastly, the patient mentions a recent  hospitalization due to sepsis, which had a significant impact on their overall health and led to a decline in renal function. They are due to see a kidney specialist soon.      Pain assessment: Cause of pain- OA Pain location- legs, knees, back, hands Pain on scale of 1-10- 7 Frequency- daily What increases pain-any activity What makes pain Better-medications Effects on ADL - significant at times Any change in general medical condition-no  Current opioids rx- tramadol 100 mg twice daily # meds rx- 120 Effectiveness of current meds-good Adverse reactions from pain meds-none Morphine equivalent- 20 MME/Day  Pill count performed-No Last drug screen - 05/30/2022 Urine drug screen today- Yes Was the [facility] reviewed- yes  If yes were their any concerning findings? - no   Overdose risk: low    06/22/2020   12:21 PM  Opioid Risk   Alcohol 0  Illegal Drugs 0  Rx Drugs 0  Alcohol 0  Illegal Drugs 0  Rx Drugs 0  Age between 16-45 years  0  History of Preadolescent Sexual Abuse 0  Psychological Disease 0  Depression 0  Opioid Risk Tool Scoring 0  Opioid Risk Interpretation Low Risk     Pain contract signed on: 03/28/2023   Relevant past medical, surgical, family, and social history reviewed and updated as indicated.  Allergies and medications reviewed and updated. Data reviewed: Chart in Epic.   Past Medical History:  Diagnosis Date   Anxiety    Arthritis    Hyperlipidemia    Hypertension    Osteopenia 07/04/2021   Prediabetes  07/09/2021   Psoriasis     Past Surgical History:  Procedure Laterality Date   ABDOMINAL HYSTERECTOMY     CHOLECYSTECTOMY     HERNIA REPAIR     KNEE SURGERY     TUBAL LIGATION      Social History   Socioeconomic History   Marital status: Single    Spouse name: Not on file   Number of children: 2   Years of education: Not on file   Highest education level: 9th grade  Occupational History   Occupation: Retired  Tobacco Use    Smoking status: Former    Current packs/day: 0.50    Average packs/day: 0.5 packs/day for 40.0 years (20.0 ttl pk-yrs)    Types: Cigarettes   Smokeless tobacco: Never   Tobacco comments:    Smoking since she was 29 or younger    02/25/2023 Patient stop smoking 01/2023  Vaping Use   Vaping status: Never Used  Substance and Sexual Activity   Alcohol use: No   Drug use: No   Sexual activity: Not Currently    Birth control/protection: Surgical  Other Topics Concern   Not on file  Social History Narrative   She lives alone on ground level apartment - has children.    Daughter, [name] is her main caregiver and means for transportation.   She also has an aide that comes in for 2 hours on weekdays to help with bathing, meals, meds etc   Social Determinants of Health   Financial Resource Strain: Low Risk  (11/07/2022)   Overall Financial Resource Strain (CARDIA)    Difficulty of Paying Living Expenses: Not hard at all  Food Insecurity: No Food Insecurity (02/18/2023)   Hunger Vital Sign    Worried About Running Out of Food in the Last Year: Never true    Ran Out of Food in the Last Year: Never true  Transportation Needs: No Transportation Needs (02/18/2023)   PRAPARE - Administrator, Civil Service (Medical): No    Lack of Transportation (Non-Medical): No  Physical Activity: Insufficiently Active (11/07/2022)   Exercise Vital Sign    Days of Exercise per Week: 3 days    Minutes of Exercise per Session: 30 min  Stress: No Stress Concern Present (11/07/2022)   [employer] of Occupational Health - Occupational Stress Questionnaire    Feeling of Stress : Not at all  Social Connections: Socially Isolated (11/07/2022)   Social Connection and Isolation Panel [NHANES]    Frequency of Communication with Friends and Family: More than three times a week    Frequency of Social Gatherings with Friends and Family: More than three times a week    Attends Religious Services: Never     Database administrator or Organizations: No    Attends Banker Meetings: Never    Marital Status: Widowed  Intimate Partner Violence: Not At Risk (02/04/2023)   Humiliation, Afraid, Rape, and Kick questionnaire    Fear of Current or Ex-Partner: No    Emotionally Abused: No    Physically Abused: No    Sexually Abused: No    Outpatient Encounter Medications as of 03/28/2023  Medication Sig   albuterol (VENTOLIN HFA) 108 (90 Base) MCG/ACT inhaler INHALE 2 PUFFS INTO THE LUNGS EVERY 6 HOURS AS NEEDED.   apixaban (ELIQUIS) 5 MG TABS tablet Take 1 tablet (5 mg total) by mouth 2 (two) times daily.   CALCIUM PO Take 1 tablet by mouth daily.   Cholecalciferol (  VITAMIN D3) 1000 units CAPS Take 2,000 Units by mouth.   diltiazem (CARDIZEM CD) 180 MG 24 hr capsule Take 1 capsule (180 mg total) by mouth daily.   Emollient (CERAVE DIABETICS DRY SKIN) CREA Apply 1 Application topically 2 (two) times daily.   escitalopram (LEXAPRO) 20 MG tablet take 1 tablet once daily.   fluticasone-salmeterol (WIXELA INHUB) 250-50 MCG/ACT AEPB Inhale 1 puff into the lungs in the morning and at bedtime.   furosemide (LASIX) 20 MG tablet Take 1 tablet (20 mg total) by mouth daily. (Patient taking differently: Take 20 mg by mouth daily as needed for fluid or edema.)   meloxicam (MOBIC) 7.5 MG tablet Take 7.5 mg by mouth daily.   metoprolol tartrate (LOPRESSOR) 25 MG tablet Take 1 tablet (25 mg total) by mouth 2 (two) times daily.   naloxone (NARCAN) nasal spray 4 mg/0.1 mL As needed or respiratory depression or decreased mental status related to narcotic use / overdose   nystatin (MYCOSTATIN/NYSTOP) powder Apply 1 application. topically 3 (three) times daily.   pantoprazole (PROTONIX) 40 MG tablet Take 1 tablet (40 mg total) by mouth daily.   [START ON 05/27/2023] traMADol (ULTRAM) 50 MG tablet Take 2 tablets (100 mg total) by mouth 2 (two) times daily.   [START ON 04/27/2023] traMADol (ULTRAM) 50 MG tablet Take  2 tablets (100 mg total) by mouth 2 (two) times daily.   traZODone (DESYREL) 50 MG tablet Take 1 tablet (50 mg total) by mouth at bedtime.   triamcinolone (KENALOG) 0.025 % cream Apply 1 Application topically 2 (two) times daily.   [DISCONTINUED] traMADol (ULTRAM) 50 MG tablet Take 2 tablets (100 mg total) by mouth 2 (two) times daily.   traMADol (ULTRAM) 50 MG tablet Take 2 tablets (100 mg total) by mouth 2 (two) times daily.   No facility-administered encounter medications on file as of 03/28/2023.    No Known Allergies  Pertinent ROS per HPI, otherwise unremarkable      Objective:  BP 118/74   Pulse 76   Temp (!) 97 F (36.1 C) (Temporal)   Ht 5\' 4"  (1.626 m)   Wt 163 lb 12.8 oz (74.3 kg)   SpO2 90%   BMI 28.12 kg/m    Wt Readings from Last 3 Encounters:  03/28/23 163 lb 12.8 oz (74.3 kg)  03/03/23 154 lb (69.9 kg)  02/25/23 154 lb 3.2 oz (69.9 kg)    Physical Exam Vitals and nursing note reviewed.  Constitutional:      General: She is not in acute distress.    Appearance: Normal appearance. She is ill-appearing (chronically). She is not toxic-appearing or diaphoretic.  HENT:     Head: Normocephalic and atraumatic.     Nose: Nose normal.     Mouth/Throat:     Mouth: Mucous membranes are moist.  Eyes:     Conjunctiva/sclera: Conjunctivae normal.     Pupils: Pupils are equal, round, and reactive to light.  Cardiovascular:     Rate and Rhythm: Normal rate and regular rhythm.     Heart sounds: Normal heart sounds.  Pulmonary:     Effort: Pulmonary effort is normal.     Breath sounds: Normal breath sounds.  Abdominal:     General: Bowel sounds are normal.     Palpations: Abdomen is soft.  Skin:    General: Skin is warm and dry.     Capillary Refill: Capillary refill takes less than 2 seconds.     Findings: Rash (scattered dry patches  to arms) present.  Neurological:     General: No focal deficit present.     Mental Status: She is alert and oriented to person,  place, and time.     Gait: Gait abnormal (antalgic).  Psychiatric:        Mood and Affect: Mood normal.        Behavior: Behavior normal.        Thought Content: Thought content normal.        Judgment: Judgment normal.    Physical Exam   EXTREMITIES: Mild edema in one leg.        Results for orders placed or performed in visit on 03/10/23  CBC with Differential/Platelet  Result Value Ref Range   WBC 12.9 (H) 3.4 - 10.8 x10E3/uL   RBC 3.53 (L) 3.77 - 5.28 x10E6/uL   Hemoglobin 11.7 11.1 - 15.9 g/dL   Hematocrit 64.4 03.4 - 46.6 %   MCV 104 (H) 79 - 97 fL   MCH 33.1 (H) 26.6 - 33.0 pg   MCHC 32.0 31.5 - 35.7 g/dL   RDW 74.2 59.5 - 63.8 %   Platelets 163 150 - 450 x10E3/uL   Neutrophils 57 Not Estab. %   Lymphs 29 Not Estab. %   Monocytes 10 Not Estab. %   Eos 3 Not Estab. %   Basos 0 Not Estab. %   Neutrophils Absolute 7.4 (H) 1.4 - 7.0 x10E3/uL   Lymphocytes Absolute 3.7 (H) 0.7 - 3.1 x10E3/uL   Monocytes Absolute 1.3 (H) 0.1 - 0.9 x10E3/uL   EOS (ABSOLUTE) 0.4 0.0 - 0.4 x10E3/uL   Basophils Absolute 0.0 0.0 - 0.2 x10E3/uL   Immature Granulocytes 1 Not Estab. %   Immature Grans (Abs) 0.1 0.0 - 0.1 x10E3/uL  BMP8+EGFR  Result Value Ref Range   Glucose 99 70 - 99 mg/dL   BUN 37 (H) 8 - 27 mg/dL   Creatinine, Ser 7.56 (H) 0.57 - 1.00 mg/dL   eGFR 29 (L) >43 PI/RJJ/8.84   BUN/Creatinine Ratio 21 12 - 28   Sodium 142 134 - 144 mmol/L   Potassium 4.8 3.5 - 5.2 mmol/L   Chloride 104 96 - 106 mmol/L   CO2 21 20 - 29 mmol/L   Calcium 9.6 8.7 - 10.3 mg/dL       Pertinent labs & imaging results that were available during my care of the patient were reviewed by me and considered in my medical decision making.  Assessment & Plan:  Velisha was seen today for pain management.  Diagnoses and all orders for this visit:  Primary osteoarthritis involving multiple joints -     ToxASSURE Select 13 (MW), Urine -     traMADol (ULTRAM) 50 MG tablet; Take 2 tablets (100 mg total)  by mouth 2 (two) times daily. -     traMADol (ULTRAM) 50 MG tablet; Take 2 tablets (100 mg total) by mouth 2 (two) times daily. -     traMADol (ULTRAM) 50 MG tablet; Take 2 tablets (100 mg total) by mouth 2 (two) times daily. -     CMP14+EGFR -     CBC with Differential/Platelet -     VITAMIN D 25 Hydroxy (Vit-D Deficiency, Fractures)  Controlled substance agreement signed -     traMADol (ULTRAM) 50 MG tablet; Take 2 tablets (100 mg total) by mouth 2 (two) times daily.  Moderate episode of recurrent major depressive disorder (HCC) -     Thyroid Panel With TSH  Vitamin D deficiency -  VITAMIN D 25 Hydroxy (Vit-D Deficiency, Fractures)  Stage 3a chronic kidney disease (HCC) -     Microalbumin / creatinine urine ratio  Psoriasis  Mixed hyperlipidemia -     CMP14+EGFR -     CBC with Differential/Platelet -     Lipid panel -     Thyroid Panel With TSH  Primary hypertension -     CMP14+EGFR -     CBC with Differential/Platelet -     Lipid panel -     Thyroid Panel With TSH     Assessment and Plan    Psoriasis Recurrent flare-up, possibly medication-induced. - Monitor symptoms - Consider dermatology referral if symptoms persist  Chronic Pain Management On tramadol 100 mg BID for chronic pain, pain level 6-7/10. Previous dosage reduction due to respiratory failure. No current issues with dosage. Discussed risks of high-dose tramadol, including respiratory depression, especially in older adults. Patient prefers current dosage for effective pain relief. - Refill tramadol 100 mg BID - Discuss potential referral to pain management for additional options such as injections  Osteoarthritis Knee pain and swelling managed with tramadol and previous injections. Last injection <3 months ago, so another injection not possible yet. Discussed alternative pain management options, including physical therapy and non-pharmacological approaches. - Consider future injections - Monitor knee  swelling and pain  Chronic Kidney Disease (CKD) CKD with previously declined renal function. Follow-up with nephrologist (Dr. Ricarda Frame) pending. Discussed importance of regular monitoring and potential CKD progression. - Repeat renal function tests today - Ensure follow-up with nephrologist is scheduled  COPD No current issues. Conflicting information from different doctors regarding diagnosis. Discussed need for consistent follow-up and monitoring of respiratory symptoms. - Monitor respiratory symptoms - Review previous pulmonary evaluations  Depression Feeling 'about normal'. Recent stressor related to daughter's partner resolved. Discussed importance of mental health monitoring and potential interventions if symptoms worsen. - Monitor mental health status - Consider mental health referral if symptoms worsen  General Health Maintenance Inquired about continued oxygen use. Advised to monitor oxygen levels and maintain >90%. Discussed potential to discontinue oxygen if levels remain stable. - Monitor oxygen levels - Discontinue oxygen use if levels remain >90%  Follow-up - Schedule follow-up in 3-4 months - Update lab work and urine drug screen.          Continue all other maintenance medications.  Follow up plan: Return in about 3 months (around 06/28/2023) for Pain management.   Continue healthy lifestyle choices, including diet (rich in fruits, vegetables, and lean proteins, and low in salt and simple carbohydrates) and exercise (at least 30 minutes of moderate physical activity daily).  Educational handout given for chronic pain  The above assessment and management plan was discussed with the patient. The patient verbalized understanding of and has agreed to the management plan. Patient is aware to call the clinic if they develop any new symptoms or if symptoms persist or worsen. Patient is aware when to return to the clinic for a follow-up visit. Patient educated on when it  is appropriate to go to the emergency department.   Kari Baars, FNP-C [facility] [phone]

## 2023-03-29 LAB — THYROID PANEL WITH TSH
Free Thyroxine Index: 1.9 (ref 1.2–4.9)
T3 Uptake Ratio: 24 % (ref 24–39)
T4, Total: 7.9 ug/dL (ref 4.5–12.0)
TSH: 2.35 u[IU]/mL (ref 0.450–4.500)

## 2023-03-29 LAB — CBC WITH DIFFERENTIAL/PLATELET
Basophils Absolute: 0.1 10*3/uL (ref 0.0–0.2)
Basos: 1 %
EOS (ABSOLUTE): 0.5 10*3/uL — ABNORMAL HIGH (ref 0.0–0.4)
Eos: 4 %
Hematocrit: 34.9 % (ref 34.0–46.6)
Hemoglobin: 11.3 g/dL (ref 11.1–15.9)
Immature Grans (Abs): 0.1 10*3/uL (ref 0.0–0.1)
Immature Granulocytes: 1 %
Lymphocytes Absolute: 3.9 10*3/uL — ABNORMAL HIGH (ref 0.7–3.1)
Lymphs: 30 %
MCH: 33.3 pg — ABNORMAL HIGH (ref 26.6–33.0)
MCHC: 32.4 g/dL (ref 31.5–35.7)
MCV: 103 fL — ABNORMAL HIGH (ref 79–97)
Monocytes Absolute: 1.2 10*3/uL — ABNORMAL HIGH (ref 0.1–0.9)
Monocytes: 9 %
Neutrophils Absolute: 7.2 10*3/uL — ABNORMAL HIGH (ref 1.4–7.0)
Neutrophils: 55 %
Platelets: 245 10*3/uL (ref 150–450)
RBC: 3.39 x10E6/uL — ABNORMAL LOW (ref 3.77–5.28)
RDW: 12 % (ref 11.7–15.4)
WBC: 13 10*3/uL — ABNORMAL HIGH (ref 3.4–10.8)

## 2023-03-29 LAB — CMP14+EGFR
ALT: 8 [IU]/L (ref 0–32)
AST: 16 [IU]/L (ref 0–40)
Albumin: 3.8 g/dL (ref 3.8–4.8)
Alkaline Phosphatase: 64 [IU]/L (ref 44–121)
BUN/Creatinine Ratio: 16 (ref 12–28)
BUN: 25 mg/dL (ref 8–27)
Bilirubin Total: 0.3 mg/dL (ref 0.0–1.2)
CO2: 24 mmol/L (ref 20–29)
Calcium: 9.2 mg/dL (ref 8.7–10.3)
Chloride: 103 mmol/L (ref 96–106)
Creatinine, Ser: 1.53 mg/dL — ABNORMAL HIGH (ref 0.57–1.00)
Globulin, Total: 2.2 g/dL (ref 1.5–4.5)
Glucose: 107 mg/dL — ABNORMAL HIGH (ref 70–99)
Potassium: 4.6 mmol/L (ref 3.5–5.2)
Sodium: 144 mmol/L (ref 134–144)
Total Protein: 6 g/dL (ref 6.0–8.5)
eGFR: 35 mL/min/{1.73_m2} — ABNORMAL LOW (ref >59)

## 2023-03-29 LAB — LIPID PANEL
Chol/HDL Ratio: 3.2 ratio (ref 0.0–4.4)
Cholesterol, Total: 147 mg/dL (ref 100–199)
HDL: 46 mg/dL (ref >39)
LDL Chol Calc (NIH): 64 mg/dL (ref 0–99)
Triglycerides: 229 mg/dL — ABNORMAL HIGH (ref 0–149)
VLDL Cholesterol Cal: 37 mg/dL (ref 5–40)

## 2023-03-29 LAB — MICROALBUMIN / CREATININE URINE RATIO
Creatinine, Urine: 157.1 mg/dL
Microalb/Creat Ratio: 18 mg/g{creat} (ref 0–29)
Microalbumin, Urine: 27.9 ug/mL

## 2023-03-29 LAB — VITAMIN D 25 HYDROXY (VIT D DEFICIENCY, FRACTURES): Vit D, 25-Hydroxy: 39.5 ng/mL (ref 30.0–100.0)

## 2023-04-01 LAB — TOXASSURE SELECT 13 (MW), URINE

## 2023-04-09 ENCOUNTER — Telehealth: Payer: Self-pay | Admitting: Family Medicine

## 2023-04-09 NOTE — Telephone Encounter (Signed)
Copied from CRM [id number]. Topic: General - Other >> Apr 09, 2023  8:35 AM Desma Mcgregor wrote: Reason for CRM: Everlean Alstrom, the home health PT, called to inform Dr. Reginia Forts that the patient will be missing her therapy appt today.

## 2023-04-09 NOTE — Telephone Encounter (Signed)
FYI

## 2023-04-11 ENCOUNTER — Other Ambulatory Visit: Payer: Self-pay | Admitting: Family Medicine

## 2023-04-11 DIAGNOSIS — F411 Generalized anxiety disorder: Secondary | ICD-10-CM

## 2023-04-11 DIAGNOSIS — R062 Wheezing: Secondary | ICD-10-CM

## 2023-04-11 DIAGNOSIS — E7841 Elevated Lipoprotein(a): Secondary | ICD-10-CM

## 2023-04-13 ENCOUNTER — Other Ambulatory Visit: Payer: Self-pay | Admitting: Family Medicine

## 2023-04-13 DIAGNOSIS — J301 Allergic rhinitis due to pollen: Secondary | ICD-10-CM

## 2023-04-14 DIAGNOSIS — G9341 Metabolic encephalopathy: Secondary | ICD-10-CM | POA: Diagnosis not present

## 2023-04-14 DIAGNOSIS — J441 Chronic obstructive pulmonary disease with (acute) exacerbation: Secondary | ICD-10-CM | POA: Diagnosis not present

## 2023-04-14 DIAGNOSIS — I4891 Unspecified atrial fibrillation: Secondary | ICD-10-CM | POA: Diagnosis not present

## 2023-04-14 DIAGNOSIS — I5033 Acute on chronic diastolic (congestive) heart failure: Secondary | ICD-10-CM | POA: Diagnosis not present

## 2023-04-14 NOTE — Addendum Note (Signed)
Addended by: Angela Adam on: 04/14/2023 09:44 AM   Modules accepted: Orders

## 2023-04-16 ENCOUNTER — Other Ambulatory Visit: Payer: Medicare HMO

## 2023-04-21 ENCOUNTER — Other Ambulatory Visit: Payer: Medicare HMO

## 2023-04-21 DIAGNOSIS — D729 Disorder of white blood cells, unspecified: Secondary | ICD-10-CM

## 2023-04-22 LAB — ANEMIA PROFILE B
Basophils Absolute: 0.1 10*3/uL (ref 0.0–0.2)
Basos: 1 %
EOS (ABSOLUTE): 1 10*3/uL — ABNORMAL HIGH (ref 0.0–0.4)
Eos: 7 %
Ferritin: 832 ng/mL — ABNORMAL HIGH (ref 15–150)
Folate: >20 ng/mL (ref >3.0)
Hematocrit: 36.7 % (ref 34.0–46.6)
Hemoglobin: 11.9 g/dL (ref 11.1–15.9)
Immature Grans (Abs): 0.1 10*3/uL (ref 0.0–0.1)
Immature Granulocytes: 1 %
Iron Saturation: 30 % (ref 15–55)
Iron: 83 ug/dL (ref 27–139)
Lymphocytes Absolute: 3.8 10*3/uL — ABNORMAL HIGH (ref 0.7–3.1)
Lymphs: 28 %
MCH: 33.4 pg — ABNORMAL HIGH (ref 26.6–33.0)
MCHC: 32.4 g/dL (ref 31.5–35.7)
MCV: 103 fL — ABNORMAL HIGH (ref 79–97)
Monocytes Absolute: 1.1 10*3/uL — ABNORMAL HIGH (ref 0.1–0.9)
Monocytes: 8 %
Neutrophils Absolute: 7.4 10*3/uL — ABNORMAL HIGH (ref 1.4–7.0)
Neutrophils: 55 %
Platelets: 231 10*3/uL (ref 150–450)
RBC: 3.56 x10E6/uL — ABNORMAL LOW (ref 3.77–5.28)
RDW: 11.9 % (ref 11.7–15.4)
Retic Ct Pct: 4 % — ABNORMAL HIGH (ref 0.6–2.6)
Total Iron Binding Capacity: 277 ug/dL (ref 250–450)
UIBC: 194 ug/dL (ref 118–369)
Vitamin B-12: 625 pg/mL (ref 232–1245)
WBC: 13.5 10*3/uL — ABNORMAL HIGH (ref 3.4–10.8)

## 2023-04-24 DIAGNOSIS — I5032 Chronic diastolic (congestive) heart failure: Secondary | ICD-10-CM | POA: Diagnosis not present

## 2023-04-24 DIAGNOSIS — I350 Nonrheumatic aortic (valve) stenosis: Secondary | ICD-10-CM | POA: Diagnosis not present

## 2023-04-24 DIAGNOSIS — I129 Hypertensive chronic kidney disease with stage 1 through stage 4 chronic kidney disease, or unspecified chronic kidney disease: Secondary | ICD-10-CM | POA: Diagnosis not present

## 2023-04-24 DIAGNOSIS — N1832 Chronic kidney disease, stage 3b: Secondary | ICD-10-CM | POA: Diagnosis not present

## 2023-04-25 ENCOUNTER — Other Ambulatory Visit: Payer: Self-pay | Admitting: Family Medicine

## 2023-04-25 DIAGNOSIS — M150 Primary generalized (osteo)arthritis: Secondary | ICD-10-CM

## 2023-05-12 ENCOUNTER — Other Ambulatory Visit: Payer: Self-pay | Admitting: Family Medicine

## 2023-05-12 DIAGNOSIS — M150 Primary generalized (osteo)arthritis: Secondary | ICD-10-CM

## 2023-05-12 DIAGNOSIS — Z79899 Other long term (current) drug therapy: Secondary | ICD-10-CM

## 2023-05-15 DIAGNOSIS — G9341 Metabolic encephalopathy: Secondary | ICD-10-CM | POA: Diagnosis not present

## 2023-05-15 DIAGNOSIS — I5033 Acute on chronic diastolic (congestive) heart failure: Secondary | ICD-10-CM | POA: Diagnosis not present

## 2023-05-15 DIAGNOSIS — I4891 Unspecified atrial fibrillation: Secondary | ICD-10-CM | POA: Diagnosis not present

## 2023-05-15 DIAGNOSIS — J441 Chronic obstructive pulmonary disease with (acute) exacerbation: Secondary | ICD-10-CM | POA: Diagnosis not present

## 2023-05-20 ENCOUNTER — Ambulatory Visit (HOSPITAL_COMMUNITY)
Admission: RE | Admit: 2023-05-20 | Discharge: 2023-05-20 | Disposition: A | Discharge status: Home / Self Care | Payer: Medicare HMO | Source: Ambulatory Visit | Attending: Internal Medicine | Admitting: Internal Medicine

## 2023-05-20 DIAGNOSIS — J441 Chronic obstructive pulmonary disease with (acute) exacerbation: Secondary | ICD-10-CM | POA: Insufficient documentation

## 2023-05-20 LAB — PULMONARY FUNCTION TEST
DL/VA % pred: 100 %
DL/VA: 4.14 ml/min/mmHg/L
DLCO unc % pred: 62 %
DLCO unc: 11.76 ml/min/mmHg
FEF 25-75 Post: 0.48 L/s
FEF 25-75 Pre: 0.27 L/s
FEF2575-%Change-Post: 76 %
FEF2575-%Pred-Post: 31 %
FEF2575-%Pred-Pre: 18 %
FEV1-%Change-Post: 21 %
FEV1-%Pred-Post: 42 %
FEV1-%Pred-Pre: 34 %
FEV1-Post: 0.85 L
FEV1-Pre: 0.7 L
FEV1FVC-%Change-Post: -2 %
FEV1FVC-%Pred-Pre: 69 %
FEV6-%Change-Post: 21 %
FEV6-%Pred-Post: 61 %
FEV6-%Pred-Pre: 50 %
FEV6-Post: 1.58 L
FEV6-Pre: 1.3 L
FEV6FVC-%Change-Post: -2 %
FEV6FVC-%Pred-Post: 99 %
FEV6FVC-%Pred-Pre: 101 %
FVC-%Change-Post: 24 %
FVC-%Pred-Post: 62 %
FVC-%Pred-Pre: 50 %
FVC-Post: 1.68 L
FVC-Pre: 1.35 L
Post FEV1/FVC ratio: 51 %
Post FEV6/FVC ratio: 94 %
Pre FEV1/FVC ratio: 52 %
Pre FEV6/FVC Ratio: 96 %
RV % pred: 124 %
RV: 2.94 L
TLC % pred: 87 %
TLC: 4.4 L

## 2023-05-20 MED ORDER — ALBUTEROL SULFATE (2.5 MG/3ML) 0.083% IN NEBU
2.5000 mg | INHALATION_SOLUTION | Freq: Once | RESPIRATORY_TRACT | Status: AC
Start: 2023-05-20 — End: 2023-05-20
  Administered 2023-05-20: 2.5 mg via RESPIRATORY_TRACT

## 2023-05-24 ENCOUNTER — Encounter: Payer: Self-pay | Admitting: Internal Medicine

## 2023-06-05 ENCOUNTER — Other Ambulatory Visit: Payer: Self-pay | Admitting: Family Medicine

## 2023-06-08 NOTE — Progress Notes (Signed)
Cardiology Office Note:  .   Date:  06/13/2023  ID:  Denise Underwood, DOB [birth year], MRN [id number] PCP: Sonny Masters, FNP   HeartCare Providers Cardiologist:  Little Ishikawa, MD   History of Present Illness: .   Denise Underwood is a 78 y.o. female with a past medical history of paroxysmal atrial fibrillation, diastolic dysfunction, aortic stenosis, HTN, HLD, prediabetes, COPD tobacco use, arthritis. Patient is followed by Dr. Bjorn Pippin and presents for afib follow up  Patient was admitted in 01/2023 with septic shock, community acquired pneumonia. Treated with antibiotics, steroids, and supplemental oxygen. While admitted, patient was found to have new onset atrial fibrillation. Cardiology was consulted. Echocardiogram 02/04/23 showed EF 60-65%, no regional wall motion abnormalities, grade I DD, normal RV function, normal PA systolic pressure, mid aortic valve stenosis.  Her troponins were elevated, and it was though to to be demand ischemia in the setting of septic shock, respiratory failure, and afib with RVR. She was started on diltiazem, metoprolol, and eliquis. Converted to NSR.   Patient was seen by cardiology on 02/25/23. At that time, patient was doing well. Denied palpitations, chest pain, dyspnea, edema. She was in NSR at that appointment Remained on metoprolol, diltiazem and eliquis.   Today, patient presents for a follow up appointment. Reports that she has occasional swelling in her legs, often worse in her L>R. The swelling is managed with Lasix, which the patient takes every two days or so. The patient reports that the swelling goes down after taking Lasix. The patient also reports weight gain since quitting smoking five months ago. The patient has not had any issues with dizziness or fluttering in the chest. Denies shortness of breath, chest pain. The patient is also on Eliquis for atrial fibrillation, diltiazem and metoprolol for heart rate control. The patient is  followed by a kidney doctor and a lung doctor. Has an appointment with her kidney doctor in the next month or so, and is going to get labs drawn through their office.  Denies bleeding on eliquis.   ROS: per HPI   Studies Reviewed: .   Cardiac Studies & Procedures      ECHOCARDIOGRAM  ECHOCARDIOGRAM COMPLETE 02/04/2023  Narrative ECHOCARDIOGRAM REPORT    [name] Date of Exam: 02/04/2023 Medical Rec #:  [id number]      Height:       64.0 in Accession #:    [id number]     Weight:       171.7 lb Date of Birth:  05-Aug-1945      BSA:          1.834 m Patient Age:    76 years       BP:           107/67 mmHg Patient Gender: F              HR:           85 bpm. Exam Location:  Inpatient  Procedure: 2D Echo, Color Doppler and Cardiac Doppler  Indications:    I48.91* Unspeicified atrial fibrillation  History:        Patient has no prior history of Echocardiogram examinations. Abnormal ECG, Arrythmias:Atrial Fibrillation, Signs/Symptoms:Dyspnea and Shortness of Breath; Risk Factors:Current Smoker and Hypertension. Shock. Substance abuse. Hypoxia.  Sonographer:    Sheralyn Boatman RDCS Referring Phys: [id number] Cristopher Peru   Sonographer Comments: Technically difficult study due to poor echo windows. Image acquisition challenging due to  uncooperative patient. Patient moving throughout exam, had to constantly re-direct. IMPRESSIONS   1. Left ventricular ejection fraction, by estimation, is 60 to 65%. The left ventricle has normal function. The left ventricle has no regional wall motion abnormalities. Left ventricular diastolic parameters are consistent with Grade I diastolic dysfunction (impaired relaxation). 2. Right ventricular systolic function is normal. The right ventricular size is mildly enlarged. There is normal pulmonary artery systolic pressure. 3. Left atrial size was mildly dilated. 4. Right atrial size was mildly dilated. 5. The mitral valve is degenerative.  No evidence of mitral valve regurgitation. No evidence of mitral stenosis. 6. The aortic valve is calcified. Aortic valve regurgitation is not visualized. Mild aortic valve stenosis. Aortic valve mean gradient measures 14.0 mmHg. Aortic valve Vmax measures 2.48 m/s. 7. The inferior vena cava is dilated in size with <50% respiratory variability, suggesting right atrial pressure of 15 mmHg.  Comparison(s): No prior Echocardiogram.  FINDINGS Left Ventricle: Left ventricular ejection fraction, by estimation, is 60 to 65%. The left ventricle has normal function. The left ventricle has no regional wall motion abnormalities. The left ventricular internal cavity size was normal in size. There is no left ventricular hypertrophy. Left ventricular diastolic parameters are consistent with Grade I diastolic dysfunction (impaired relaxation).  Right Ventricle: The right ventricular size is mildly enlarged. No increase in right ventricular wall thickness. Right ventricular systolic function is normal. There is normal pulmonary artery systolic pressure. The tricuspid regurgitant velocity is 2.12 m/s, and with an assumed right atrial pressure of 15 mmHg, the estimated right ventricular systolic pressure is 33.0 mmHg.  Left Atrium: Left atrial size was mildly dilated.  Right Atrium: Right atrial size was mildly dilated.  Pericardium: There is no evidence of pericardial effusion.  Mitral Valve: The mitral valve is degenerative in appearance. No evidence of mitral valve regurgitation. No evidence of mitral valve stenosis.  Tricuspid Valve: The tricuspid valve is not well visualized. Tricuspid valve regurgitation is trivial. No evidence of tricuspid stenosis.  Aortic Valve: The aortic valve is calcified. Aortic valve regurgitation is not visualized. Mild aortic stenosis is present. Aortic valve mean gradient measures 14.0 mmHg. Aortic valve peak gradient measures 24.6 mmHg. Aortic valve area, by VTI measures 1.49  cm.  Pulmonic Valve: The pulmonic valve was normal in structure. Pulmonic valve regurgitation is not visualized. No evidence of pulmonic stenosis.  Aorta: The ascending aorta was not well visualized and the aortic root is normal in size and structure.  Venous: The inferior vena cava is dilated in size with less than 50% respiratory variability, suggesting right atrial pressure of 15 mmHg.  IAS/Shunts: No atrial level shunt detected by color flow Doppler.   LEFT VENTRICLE PLAX 2D LVIDd:         4.10 cm     Diastology LVIDs:         2.40 cm     LV e' medial:    7.18 cm/s LV PW:         0.90 cm     LV E/e' medial:  14.8 LV IVS:        0.90 cm     LV e' lateral:   7.07 cm/s LVOT diam:     2.10 cm     LV E/e' lateral: 15.1 LV SV:         72 LV SV Index:   39 LVOT Area:     3.46 cm  LV Volumes (MOD) LV vol d, MOD A2C: 79.7 ml LV vol  d, MOD A4C: 52.3 ml LV vol s, MOD A2C: 20.9 ml LV vol s, MOD A4C: 21.2 ml LV SV MOD A2C:     58.8 ml LV SV MOD A4C:     52.3 ml LV SV MOD BP:      43.2 ml  RIGHT VENTRICLE            IVC RV S prime:     8.92 cm/s  IVC diam: 2.20 cm TAPSE (M-mode): 2.0 cm  LEFT ATRIUM             Index        RIGHT ATRIUM           Index LA diam:        3.70 cm 2.02 cm/m   RA Area:     15.40 cm LA Vol (A2C):   41.7 ml 22.74 ml/m  RA Volume:   40.50 ml  22.09 ml/m LA Vol (A4C):   38.8 ml 21.16 ml/m LA Biplane Vol: 42.4 ml 23.12 ml/m AORTIC VALVE AV Area (Vmax):    1.49 cm AV Area (Vmean):   1.57 cm AV Area (VTI):     1.49 cm AV Vmax:           248.00 cm/s AV Vmean:          159.000 cm/s AV VTI:            0.485 m AV Peak Grad:      24.6 mmHg AV Mean Grad:      14.0 mmHg LVOT Vmax:         107.00 cm/s LVOT Vmean:        72.200 cm/s LVOT VTI:          0.209 m LVOT/AV VTI ratio: 0.43  AORTA Ao Root diam: 2.90 cm  MITRAL VALVE                TRICUSPID VALVE MV Area (PHT): 3.92 cm     TR Peak grad:   18.0 mmHg MV Decel Time: 194 msec     TR Vmax:         212.00 cm/s MV E velocity: 106.50 cm/s MV A velocity: 103.50 cm/s  SHUNTS MV E/A ratio:  1.03         Systemic VTI:  0.21 m Systemic Diam: 2.10 cm  Riley Lam MD Electronically signed by Riley Lam MD Signature Date/Time: 02/04/2023/9:14:01 AM    Final             Risk Assessment/Calculations:    CHA2DS2-VASc Score = 5   This indicates a 7.2% annual risk of stroke. The patient's score is based upon: CHF History: 1 HTN History: 1 Diabetes History: 0 Stroke History: 0 Vascular Disease History: 0 Age Score: 2 Gender Score: 1    Physical Exam:   VS:  BP 124/78 (BP Location: Left Arm, Patient Position: Sitting, Cuff Size: Normal)   Pulse 89   Ht 5\' 4"  (1.626 m)   Wt 170 lb 9.6 oz (77.4 kg)   SpO2 91%   BMI 29.28 kg/m    Wt Readings from Last 3 Encounters:  06/13/23 170 lb 9.6 oz (77.4 kg)  03/28/23 163 lb 12.8 oz (74.3 kg)  03/03/23 154 lb (69.9 kg)    GEN: Well nourished, well developed in no acute distress. Sitting comfortably in the chair NECK: No JVD  CARDIAC:  RRR,faint, systolic murmur. Radial pulses 2+ bilaterally  RESPIRATORY:  Clear to auscultation without rales, wheezing  or rhonchi. Normal WOB on room air   ABDOMEN: Soft, non-tender, non-distended EXTREMITIES:  Trace edema in BLE, L>R. No deformity   ASSESSMENT AND PLAN: .    Paroxysmal Atrial Fibrillation  - Diagnosed in 01/2023 while admitted with septic shock, CAP. Converted to NSR on metoprolol and dilt   - Patient denies palpitations, elevated HR at home. HR is regular on exam today, suspect she remains in NSR  - Continue eliquis 5 mg BID - CHADs-VASc 5. She denies bleeding on eliquis, and reports excellent compliance  - Continue metoprolol tartrate 25 mg BID, diltiazem 180 mg daily   Diastolic Dysfunction  -Most recent echocardiogram from 01/2023 showed EF 60-65%, no regional wall motion abnormalities, grade I DD, normal RV function  - Patient on lasix 20 mg PRN - reports  taking every other day or so. She does have occasional ankle swelling, but she denies any shortness of breath. Discussed increasing her lasix to every day dosing to prevent ankle swelling, but she would prefer to continue to take as needed as she does not want to have frequent urination  - Continue lasix 20 mg PRN. Creatinine stable at 1.5 and K 4.6 in 03/2023. Patient is followed by a nephrologist, and she is getting labs drawn through their office this month. No labs today as nephrology has already ordered   CKD stage IIIb  - Creatinine 1.5 and eGFR 35 in 03/2023. This is near her baseline  - Followed by nephrology   Mild AS  - Noted on echocardiogram from 01/2023. Mean gradient 14.0 mmHg  - Has a faint systolic murmur on exam. Denies chest pain, syncope/near syncope, shortness of breath  - Anticipate repeat echo in 01/2024 for monitoring   HTN  - BP well controlled. Denies symptoms of hypotension  - Continue current regiment   HLD  - Lipid panel from 03/2023 showed LDL 64, HDL 46, triglycerides 229, total cholesterol 147  - Was previously on rosuvastatin, but it was held after patient had acute transaminitis in the setting of sepsis   COPD - Patient followed by pulm    Dispo: Follow up with Dr. Bjorn Pippin in 4 months   Signed, Jonita Albee, PA-C

## 2023-06-09 ENCOUNTER — Other Ambulatory Visit: Payer: Self-pay | Admitting: Family Medicine

## 2023-06-09 DIAGNOSIS — F331 Major depressive disorder, recurrent, moderate: Secondary | ICD-10-CM

## 2023-06-09 DIAGNOSIS — G479 Sleep disorder, unspecified: Secondary | ICD-10-CM

## 2023-06-13 ENCOUNTER — Ambulatory Visit: Payer: Medicare HMO | Attending: Cardiology | Admitting: Cardiology

## 2023-06-13 ENCOUNTER — Ambulatory Visit: Payer: Medicare HMO | Admitting: Nurse Practitioner

## 2023-06-13 ENCOUNTER — Encounter: Payer: Self-pay | Admitting: Cardiology

## 2023-06-13 VITALS — BP 124/78 | HR 89 | Ht 64.0 in | Wt 170.6 lb

## 2023-06-13 DIAGNOSIS — J449 Chronic obstructive pulmonary disease, unspecified: Secondary | ICD-10-CM

## 2023-06-13 DIAGNOSIS — I350 Nonrheumatic aortic (valve) stenosis: Secondary | ICD-10-CM

## 2023-06-13 DIAGNOSIS — I10 Essential (primary) hypertension: Secondary | ICD-10-CM

## 2023-06-13 DIAGNOSIS — E782 Mixed hyperlipidemia: Secondary | ICD-10-CM | POA: Diagnosis not present

## 2023-06-13 DIAGNOSIS — I480 Paroxysmal atrial fibrillation: Secondary | ICD-10-CM | POA: Diagnosis not present

## 2023-06-13 DIAGNOSIS — I5189 Other ill-defined heart diseases: Secondary | ICD-10-CM | POA: Diagnosis not present

## 2023-06-13 MED ORDER — APIXABAN 5 MG PO TABS: 5.0000 mg | ORAL_TABLET | Freq: Two times a day (BID) | ORAL | 3 refills | Status: DC

## 2023-06-13 NOTE — Patient Instructions (Signed)
Medication Instructions:  No changes *If you need a refill on your cardiac medications before your next appointment, please call your pharmacy*  Lab Work: No labs If you have labs (blood work) drawn today and your tests are completely normal, you will receive your results only by: MyChart Message (if you have MyChart) OR A paper copy in the mail If you have any lab test that is abnormal or we need to change your treatment, we will call you to review the results.  Testing/Procedures: No testing  Follow-Up: At [facility], you and your health needs are our priority.  As part of our continuing mission to provide you with exceptional heart care, we have created designated Provider Care Teams.  These Care Teams include your primary Cardiologist (physician) and Advanced Practice Providers (APPs -  Physician Assistants and Nurse Practitioners) who all work together to provide you with the care you need, when you need it.  We recommend signing up for the patient portal called "MyChart".  Sign up information is provided on this After Visit Summary.  MyChart is used to connect with patients for Virtual Visits (Telemedicine).  Patients are able to view lab/test results, encounter notes, upcoming appointments, etc.  Non-urgent messages can be sent to your provider as well.   To learn more about what you can do with MyChart, go to [email or web address].    Your next appointment:   4 month(s)  Provider:   Little Ishikawa, MD    Other Instructions

## 2023-06-15 DIAGNOSIS — J441 Chronic obstructive pulmonary disease with (acute) exacerbation: Secondary | ICD-10-CM | POA: Diagnosis not present

## 2023-06-15 DIAGNOSIS — G9341 Metabolic encephalopathy: Secondary | ICD-10-CM | POA: Diagnosis not present

## 2023-06-15 DIAGNOSIS — I4891 Unspecified atrial fibrillation: Secondary | ICD-10-CM | POA: Diagnosis not present

## 2023-06-15 DIAGNOSIS — I5033 Acute on chronic diastolic (congestive) heart failure: Secondary | ICD-10-CM | POA: Diagnosis not present

## 2023-06-16 ENCOUNTER — Other Ambulatory Visit (HOSPITAL_COMMUNITY): Payer: Self-pay

## 2023-06-16 ENCOUNTER — Other Ambulatory Visit: Payer: Self-pay

## 2023-06-16 ENCOUNTER — Telehealth: Payer: Self-pay | Admitting: Cardiology

## 2023-06-16 DIAGNOSIS — I480 Paroxysmal atrial fibrillation: Secondary | ICD-10-CM

## 2023-06-16 MED ORDER — APIXABAN 5 MG PO TABS
5.0000 mg | ORAL_TABLET | Freq: Two times a day (BID) | ORAL | 1 refills | Status: DC
  Filled 2023-06-16: qty 180, 90d supply, fill #0

## 2023-06-16 NOTE — Telephone Encounter (Signed)
*  STAT* If patient is at the pharmacy, call can be transferred to refill team.   1. Which medications need to be refilled? (please list name of each medication and dose if known)   apixaban (ELIQUIS) 5 MG TABS tablet   2. Would you like to learn more about the convenience, safety, & potential cost savings by using the [facility]?   3. Are you open to using the [facility] (Type [facility]. ).  4. Which pharmacy/location (including street and city if local pharmacy) is medication to be sent to?  [id number] - [place], [place] - [address]   5. Do they need a 30 day or 90 day supply?   90 day  Daughter [name]) stated patient's medication was sent to [facility] in error.  Daughter stated [facility] [place] and patient has been out of this medication for 3 days.

## 2023-06-16 NOTE — Telephone Encounter (Signed)
Prescription refill request for Eliquis received. Indication:a fib Last office visit: 06/13/23 Scr: 1.53 epic 03/28/23 Age: 78 Weight: 77kg

## 2023-06-19 ENCOUNTER — Encounter: Payer: Self-pay | Admitting: Internal Medicine

## 2023-06-19 ENCOUNTER — Other Ambulatory Visit (HOSPITAL_COMMUNITY): Payer: Self-pay

## 2023-06-19 ENCOUNTER — Ambulatory Visit: Payer: Medicare HMO | Admitting: Internal Medicine

## 2023-06-19 VITALS — BP 114/69 | HR 79 | Ht 64.0 in | Wt 169.0 lb

## 2023-06-19 DIAGNOSIS — J449 Chronic obstructive pulmonary disease, unspecified: Secondary | ICD-10-CM

## 2023-06-19 NOTE — Assessment & Plan Note (Addendum)
 Quit smoking 02/02/23 -  05/20/23  PFT's  05/20/23   FEV1 0.85 (42% ) ratio 0.51  p 21 % improvement from saba p ? [place] prior to study with DLCO  11.76 (62%)   and FV curve mod concave    Note  21% improvement suggesting a small reversible component but pt so sedentary I doubt she's really benefit but I asked her to check sats at her highest level of activity and titrate her home 02 or activity to keep sats > 90%   - The proper method of use, as well as anticipated side effects, of a metered-dose inhaler were discussed and demonstrated to the patient using teach back method for dpi and hfa 02 and pulse ox devices.  Each maintenance medication was reviewed in detail including emphasizing most importantly the difference between maintenance and prns and under what circumstances the prns are to be triggered using an action plan format where appropriate.  Total time for H and P, chart review, counseling, reviewing  above device(s) , directly observing portions of ambulatory 02 saturation study/ and generating customized AVS unique to this office visit / same day charting = 30 min final summary f/u ov

## 2023-06-19 NOTE — Patient Instructions (Addendum)
 Plan A = Automatic = Always=    [place] one click twice daily   Plan B = Backup (to supplement plan A, not to replace it) Only use your albuterol  inhaler as a rescue medication to be used if you can't catch your breath by resting or doing a relaxed purse lip breathing pattern.  - The less you use it, the better it will work when you need it. - Ok to use the inhaler up to 2 puffs  every 4 hours if you must but call for appointment if use goes up over your usual need - Don't leave home without it !!  (think of it like the starter fluid)  Goal for your oxygen  is to keep your saturation above 90% at all times by using your home concentrator as needed or walking at a slower pace   Pulmonary follow up as needed with all your medications in hand especially your inhalers

## 2023-06-19 NOTE — Progress Notes (Addendum)
 Denise Underwood, female    DOB: 04/05/1946    MRN: [id number]   Brief patient profile:  55  yowf  quit smoking 02/02/23  referred to pulmonary clinic in [place]  03/03/2023 by [facility]  for post hosp f/u    Admit date:     02/02/2023  Discharge date: 02/11/23      Recommendations at discharge:    Started on Lasix  20 mg daily, potassium 40 mEq daily Started on Cardizem  180 mg daily, metoprolol  25 mg twice daily Continue prednisone  with taper, 40 mg for 2 days, 30 mg for 3 days, 20 mg for 3 days, 10 mg for 3 days then off Continue albuterol  inhaler as needed for shortness of breath, wheezing, started on maintenance Wixela 250-50 one puff twice daily.   Outpatient pulm and cardiology follow-ups as scheduled  O2 saturation qualifications Patient Saturations on Room Air at Rest = 84% Patient Saturations on Room Air while Ambulating = 81% Patient Saturations on 3 Liters of oxygen  while Ambulating = 92%   Discharge Diagnoses:     Septic shock (HCC)   Acute respiratory failure with hypoxia and hypercapnia (HCC)   New onset atrial fibrillation (HCC) Elevated troponins   Pressure injury of skin   AKI (acute kidney injury) (HCC)   Atrial fibrillation with RVR (HCC)   Shock liver Hypokalemia   Acute on chronic diastolic heart failure (HCC)   COPD with acute exacerbation (HCC)   Acute metabolic encephalopathy-resolved   Community acquired pneumonia       Hospital Course:   Patient is a 78 year old female with HTN, HLP, prediabetes, arthritis, COPD, tobacco use presented to [facility] ED on 02/02/2023 with shortness of breath.  Patient had noted 1 to 2 days of worsening shortness of breath, was found hypoxic with O2 sat 76% by EMS, dyspneic, tachypneic and unable to even stand at home. In ED, was found to have new atrial fibrillation with RVR, was placed on O2, Cardizem  drip was started, patient converted to NSR. Subsequently noted to be hypotensive, received IV fluids and  eventually required vasopressor support.  Patient was transferred and by PCCM to ICU on 02/03/2023   CTAP showed right lower lung infiltrates, small right pleural effusion.  ABG showed pH 7.15, pCO2 96, pO2 161, was placed on BiPAP and IV antibiotics. WBCs 34.5, creatinine 2.21, AST 760, ALT 445, troponin 84> 117.  BNP 697.       Assessment and Plan:     Septic shock (HCC), community-acquired pneumonia -CT abdomen pelvis showed right lower lung infiltrates, UA negative for UTI, no intra-abdominal pathology. -Chest x-ray 02/04/2023 with worsening RLL infiltrates -Off vasopressors, blood cultures 9/22 negative so far -Flu, RSV, COVID-negative, respiratory virus panel negative, urine strept antigen negative, Legionella antigen negative -Completed IV ceftriaxone  and Zithromax  x 5 days -Sepsis physiology resolved       Acute respiratory failure with hypoxia and hypercapnia (HCC) Community-acquired pneumonia, underlying COPD, tobacco use, RLL pleural effusion -Not on O2 at baseline -Pulmonology following, continue Pulmicort , Brovana , Yupelri , Xopenex , flutter valve -Currently on 2 L O2 via nasal cannula, wean as tolerated, will need home O2 evaluation -IV steroid weaned off, pulmonology recommending prednisone  taper  - may need nocturnal BiPAP for home however she is noncompliant with BiPAP here inpatient, refusing.  So unlikely she will use it at home. -Outpatient pulmonology appointment scheduled with Dr. Darlean -Home O2 evaluation completed, needs 3 L O2      New onset atrial fibrillation with RVR (  HCC), elevated troponins Volume overload with acute diastolic CHF -Elevated troponins likely due to demand ischemia from respiratory failure, septic shock -2D echo showed EF 60 to 65%, G1 DD, normal right ventricular systolic function -Patient was placed on IV Lasix , transitioned to oral Lasix  20 mg daily  -CHA2DS2-VASc 5, continue apixaban  -Continue diltiazem , consolidated to 180 mg daily,  metoprolol  25 mg twice daily   Acute metabolic encephalopathy -Likely due to hypercarbia ABG showed pH 7.3, pCO2 78, pO2 97 -Ammonia level normal 25, B12 normal, folate normal, TSH 0.2 -Resolved, alert and oriented x 3, mental status close to baseline   Elevated acute transaminitis, shock liver -Likely due to septic shock, AST 768, ALT 445 on admission -LFTs improving, hold statin       AKI (acute kidney injury) (HCC) -Creatinine 2.21 on admission, was 1.1 on 12/25/2022 -Improved, at baseline 1.1   Hypokalemia, hypomagnesemia -Replaced aggressively, patient will discharge on potassium supplementation while on Lasix    Pressure injury documentation -Lower coccyx medial stage I, POA -Wound care per nursing   Hyperlipidemia -Continue to hold statins due to acute transaminitis   Hypertension -Continue Cardizem , beta-blocker, BP stable   Obesity Estimated body mass index is 27.06 kg/m as calculated from the following:   Height as of this encounter: 5' 4 (1.626 m).   Weight as of this encounter: 71.5 kg.       Pain control - [place]  Controlled Substance Reporting System database was reviewed. and patient was instructed, not to drive, operate heavy machinery, perform activities at heights, swimming or participation in water  activities or provide baby-sitting services while on Pain, Sleep and Anxiety Medications; until their outpatient Physician has advised to do so again. Also recommended to not to take more than prescribed Pain, Sleep and Anxiety Medications.  Consultants: Cardiology, PCCM Procedures performed: 2D echo Disposition: Home Diet recommendation:  Discharge Diet Orders (From admission, onward)        Start     Ordered    02/11/23 0000   Diet - low sodium heart healthy        02/11/23 1119             History of Present Illness  03/03/2023  Pulmonary/ 1st office eval/ [facility] / [place] [facility]  Chief Complaint  Patient presents with   Establish Care   Baseline:Dyspnea:  [employer] shopping used [facility] sticker for knees and used walking  Rarely needed rx for flares of cough / wheeze  Cough: none now Sleep: flat bed / one pillow  SABA use: none now on wixella /no neb 02: staying above 90% even walking  Dyspea Post hosp able to walk across across parking lot and into Dr's office  Fatigue, light headed still taking cardizem /lopressor  and lasix  even with low bp and no peripheral edema and echo with nl sys function/ G1 diastolic dysfunction and mild LAE RAE on 02/04/23   Rec Plan A = Automatic = Always=    Wixella reduce to once  day for 2 weeks then stop  Plan B = Backup (to supplement plan A, not to replace it) Only use your albuterol  inhaler as a rescue medication  My office will be contacting you by phone for referral for pfts   Leave the lopressor  (metaprolol) off for now and don't take it until bp over 110        06/19/2023  f/u ov/ office/Rebecah Dangerfield re: GOLD 3 COPD  maint on wixella   Chief Complaint  Patient presents with   Follow-up  Follow up for COPD   Dyspnea:  no change in breathing really not doing much and limited by knees Cough: none  Sleeping: flat bed one pillow    resp cc  SABA use: 2 bid (misunderstand  02: prn  concentrator    No obvious day to day or daytime variability or assoc excess/ purulent sputum or mucus plugs or hemoptysis or cp or chest tightness, subjective wheeze or overt sinus or hb symptoms.    Also denies any obvious fluctuation of symptoms with weather or environmental changes or other aggravating or alleviating factors except as outlined above   No unusual exposure hx or h/o childhood pna/ asthma or knowledge of premature birth.  Current Allergies, Complete Past Medical History, Past Surgical History, Family History, and Social History were reviewed in [employer] record.  ROS  The following are not active complaints unless bolded Hoarseness, sore throat, dysphagia, dental  problems, itching, sneezing,  nasal congestion or discharge of excess mucus or purulent secretions, ear ache,   fever, chills, sweats, unintended wt loss or wt gain, classically pleuritic or exertional cp,  orthopnea pnd or arm/hand swelling  or leg swelling, presyncope, palpitations, abdominal pain, anorexia, nausea, vomiting, diarrhea  or change in bowel habits or change in bladder habits, change in stools or change in urine, dysuria, hematuria,  rash, arthralgias, visual complaints, headache, numbness, weakness or ataxia or problems with walking or coordination,  change in mood or  memory.        Current Meds  Medication Sig   albuterol  (VENTOLIN  HFA) 108 (90 Base) MCG/ACT inhaler inhale 2 puffs into the lungs every 6 hours as needed.   apixaban  (ELIQUIS ) 5 MG TABS tablet Take 1 tablet (5 mg total) by mouth 2 (two) times daily.   CALCIUM  PO Take 1 tablet by mouth daily.   CARTIA  XT 180 MG 24 hr capsule take 1 capsule (180 MILLIGRAM total) by mouth daily.   Cholecalciferol (VITAMIN D3) 1000 units CAPS Take 2,000 Units by mouth.   Emollient (CERAVE DIABETICS DRY SKIN) CREA Apply 1 Application topically 2 (two) times daily.   escitalopram  (LEXAPRO ) 20 MG tablet take 1 tablet once daily.   furosemide  (LASIX ) 20 MG tablet Take 1 tablet (20 mg total) by mouth daily. (Patient taking differently: Take 20 mg by mouth daily as needed for fluid or edema.)   levocetirizine (XYZAL ) 5 MG tablet Take 5 mg by mouth every evening.   lisinopril  (ZESTRIL ) 5 MG tablet Take 5 mg by mouth daily.   meloxicam  (MOBIC ) 7.5 MG tablet Take 7.5 mg by mouth daily.   metoprolol  tartrate (LOPRESSOR ) 25 MG tablet take 1 tablet (25 MILLIGRAM total) by mouth 2 (two) times daily.   naloxone  (NARCAN ) nasal spray 4 mg/0.1 mL As needed or respiratory depression or decreased mental status related to narcotic use / overdose   nystatin  (MYCOSTATIN /NYSTOP ) powder Apply 1 application. topically 3 (three) times daily.   pantoprazole   (PROTONIX ) 40 MG tablet take 1 tablet (40 MILLIGRAM total) by mouth daily.   rosuvastatin  (CRESTOR ) 20 MG tablet Take 20 mg by mouth.   traMADol  (ULTRAM ) 50 MG tablet Take 2 tablets (100 mg total) by mouth 2 (two) times daily.   traZODone  (DESYREL ) 50 MG tablet take 1 tablet (50 MILLIGRAM total) by mouth at bedtime.   triamcinolone  (KENALOG ) 0.025 % cream Apply 1 Application topically 2 (two) times daily.   WIXELA INHUB  250-50 MCG/ACT AEPB inhale 1 puff into the lungs in the morning and at  bedtime.                Past Medical History:  Diagnosis Date   Anxiety    Arthritis    Hyperlipidemia    Hypertension    Osteopenia 07/04/2021   Prediabetes 07/09/2021   Psoriasis       Objective:     Wts  06/19/2023         169 03/03/23 154 lb (69.9 kg)  02/25/23 154 lb 3.2 oz (69.9 kg)  02/25/23 153 lb 3.2 oz (69.5 kg)     Vital signs reviewed  06/19/2023  - Note at rest 02 sats  89% on RA   General appearance:    amb wf nad    HEENT : Oropharynx  clear         NECK :  without  apparent JVD/ palpable Nodes/TM    LUNGS: no acc muscle use,  Nl contour chest which is clear to A and P bilaterally without cough on insp or exp maneuvers   CV:  RRR  no s3 or murmur or increase in P2, and L > R pitting   ABD:  soft and nontender   MS:   ext warm without deformities Or obvious joint restrictions  calf tenderness, cyanosis or clubbing    SKIN: warm and dry without lesions    NEURO:  alert, approp, nl sensorium with  no motor or cerebellar deficits apparent.        CXR PA and Lateral:   03/03/2023 :    I personally reviewed images and impression is as follows:     Mild cm/ improved aeration bilaterally vs priors                   Assessment

## 2023-06-27 ENCOUNTER — Encounter: Payer: Self-pay | Admitting: Family Medicine

## 2023-06-27 ENCOUNTER — Ambulatory Visit (INDEPENDENT_AMBULATORY_CARE_PROVIDER_SITE_OTHER): Payer: Medicare HMO | Admitting: Family Medicine

## 2023-06-27 VITALS — BP 135/65 | HR 57 | Temp 96.9°F | Ht 64.0 in | Wt 170.2 lb

## 2023-06-27 DIAGNOSIS — N1831 Chronic kidney disease, stage 3a: Secondary | ICD-10-CM | POA: Diagnosis not present

## 2023-06-27 DIAGNOSIS — M150 Primary generalized (osteo)arthritis: Secondary | ICD-10-CM

## 2023-06-27 DIAGNOSIS — Z79899 Other long term (current) drug therapy: Secondary | ICD-10-CM

## 2023-06-27 DIAGNOSIS — L409 Psoriasis, unspecified: Secondary | ICD-10-CM | POA: Diagnosis not present

## 2023-06-27 DIAGNOSIS — J449 Chronic obstructive pulmonary disease, unspecified: Secondary | ICD-10-CM

## 2023-06-27 DIAGNOSIS — I5033 Acute on chronic diastolic (congestive) heart failure: Secondary | ICD-10-CM | POA: Diagnosis not present

## 2023-06-27 MED ORDER — TRAMADOL HCL 50 MG PO TABS: 50.0000 mg | ORAL_TABLET | Freq: Four times a day (QID) | ORAL | 0 refills | Status: DC | PRN

## 2023-06-27 MED ORDER — TRAMADOL HCL 50 MG PO TABS
50.0000 mg | ORAL_TABLET | Freq: Four times a day (QID) | ORAL | 0 refills | Status: DC | PRN
Start: 2023-06-27 — End: 2023-10-14

## 2023-06-27 MED ORDER — TRAMADOL HCL 50 MG PO TABS: 50.0000 mg | ORAL_TABLET | Freq: Four times a day (QID) | ORAL | 2 refills | Status: DC | PRN

## 2023-06-27 MED ORDER — TRAMADOL HCL 50 MG PO TABS
50.0000 mg | ORAL_TABLET | Freq: Four times a day (QID) | ORAL | 2 refills | Status: DC | PRN
Start: 2023-07-27 — End: 2023-06-27

## 2023-06-27 MED ORDER — TRAMADOL HCL 50 MG PO TABS
50.0000 mg | ORAL_TABLET | Freq: Four times a day (QID) | ORAL | 2 refills | Status: DC | PRN
Start: 2023-08-26 — End: 2023-06-27

## 2023-06-27 NOTE — Progress Notes (Signed)
Subjective:  Patient ID: Denise Underwood, female    DOB: 15-Dec-1945, 78 y.o.   MRN: [id number]  Patient Care Team: Sonny Masters, FNP as PCP - General (Family Medicine) Little Ishikawa, MD as PCP - Cardiology (Cardiology) Magrinat, Valentino Hue, MD (Inactive) as Consulting Physician (Oncology) Toma Deiters, MD (Internal Medicine) Ubaldo Glassing Lebron Conners, MD as Consulting Physician (Internal Medicine) Nyoka Cowden, MD as Consulting Physician (Pulmonary Disease)   Chief Complaint:  Pain Management (3 month follow up )   HPI: Denise Underwood is a 78 y.o. female presenting on 06/27/2023 for Pain Management (3 month follow up )   Discussed the use of AI scribe software for clinical note transcription with the patient, who gave verbal consent to proceed.  History of Present Illness   Denise Underwood is a 78 year old female who presents for tramadol refills.  She takes tramadol twice daily and sometimes once daily, which effectively controls her chronic pain without significant breakthrough pain. No side effects such as constipation or fatigue.  She experienced a flare of psoriasis approximately three weeks ago, which she manages with a salve that she finds effective.  She has a history of kidney issues and is scheduled to see her nephrologist in March. She also plans to follow up with her cardiologist in three to four months. She experiences some swelling in her legs, managed with Lasix, and does not require more than two pillows to sleep. No chest pain, dizziness, or syncope.  She has a history of lung issues but reports her lungs are currently stable. She has quit smoking and has not smoked recently. She is concerned about weight gain since quitting, attributing it to increased snacking. She is attempting to manage her diet by reducing bread and sweets and is considering increasing her physical activity by walking more when the weather improves.      Pain assessment: Cause of pain-  arthritis  Pain location- all over Pain on scale of 1-10- 6-8/10 Frequency- daily What increases pain-any activity What makes pain Better-rest and medications  Effects on ADL - significant at times Any change in general medical condition-actually improved  Current opioids rx- tramadol 50 mg # meds rx- 60 Effectiveness of current meds-good Adverse reactions from pain meds-none Morphine equivalent- 20 MME/Day  Pill count performed-No Last drug screen - 03/2023 Urine drug screen today- No Was the [facility] reviewed- yes  If yes were their any concerning findings? - no   Overdose risk: low    06/22/2020   12:21 PM  Opioid Risk   Alcohol 0  Illegal Drugs 0  Rx Drugs 0  Alcohol 0  Illegal Drugs 0  Rx Drugs 0  Age between 16-45 years  0  History of Preadolescent Sexual Abuse 0  Psychological Disease 0  Depression 0  Opioid Risk Tool Scoring 0  Opioid Risk Interpretation Low Risk     Pain contract signed on:     Relevant past medical, surgical, family, and social history reviewed and updated as indicated.  Allergies and medications reviewed and updated. Data reviewed: Chart in Epic.   Past Medical History:  Diagnosis Date   Anxiety    Arthritis    Hyperlipidemia    Hypertension    Osteopenia 07/04/2021   Prediabetes 07/09/2021   Psoriasis     Past Surgical History:  Procedure Laterality Date   ABDOMINAL HYSTERECTOMY     CHOLECYSTECTOMY     HERNIA REPAIR  KNEE SURGERY     TUBAL LIGATION      Social History   Socioeconomic History   Marital status: Single    Spouse name: Not on file   Number of children: 2   Years of education: Not on file   Highest education level: 9th grade  Occupational History   Occupation: Retired  Tobacco Use   Smoking status: Former    Current packs/day: 0.50    Average packs/day: 0.5 packs/day for 40.0 years (20.0 ttl pk-yrs)    Types: Cigarettes   Smokeless tobacco: Never   Tobacco comments:    Smoking since she was  29 or younger    02/25/2023 Patient stop smoking 01/2023  Vaping Use   Vaping status: Never Used  Substance and Sexual Activity   Alcohol use: No   Drug use: No   Sexual activity: Not Currently    Birth control/protection: Surgical  Other Topics Concern   Not on file  Social History Narrative   She lives alone on ground level apartment - has children.    Daughter, [name] is her main caregiver and means for transportation.   She also has an aide that comes in for 2 hours on weekdays to help with bathing, meals, meds etc   Social Drivers of Health   Financial Resource Strain: Low Risk  (11/07/2022)   Overall Financial Resource Strain (CARDIA)    Difficulty of Paying Living Expenses: Not hard at all  Food Insecurity: No Food Insecurity (02/18/2023)   Hunger Vital Sign    Worried About Running Out of Food in the Last Year: Never true    Ran Out of Food in the Last Year: Never true  Transportation Needs: No Transportation Needs (02/18/2023)   PRAPARE - Administrator, Civil Service (Medical): No    Lack of Transportation (Non-Medical): No  Physical Activity: Insufficiently Active (11/07/2022)   Exercise Vital Sign    Days of Exercise per Week: 3 days    Minutes of Exercise per Session: 30 min  Stress: No Stress Concern Present (11/07/2022)   [employer] of Occupational Health - Occupational Stress Questionnaire    Feeling of Stress : Not at all  Social Connections: Socially Isolated (11/07/2022)   Social Connection and Isolation Panel [NHANES]    Frequency of Communication with Friends and Family: More than three times a week    Frequency of Social Gatherings with Friends and Family: More than three times a week    Attends Religious Services: Never    Database administrator or Organizations: No    Attends Banker Meetings: Never    Marital Status: Widowed  Intimate Partner Violence: Not At Risk (02/04/2023)   Humiliation, Afraid, Rape, and Kick  questionnaire    Fear of Current or Ex-Partner: No    Emotionally Abused: No    Physically Abused: No    Sexually Abused: No    Outpatient Encounter Medications as of 06/27/2023  Medication Sig   albuterol (VENTOLIN HFA) 108 (90 Base) MCG/ACT inhaler inhale 2 puffs into the lungs every 6 hours as needed.   apixaban (ELIQUIS) 5 MG TABS tablet Take 1 tablet (5 mg total) by mouth 2 (two) times daily.   CALCIUM PO Take 1 tablet by mouth daily.   CARTIA XT 180 MG 24 hr capsule take 1 capsule (180 MILLIGRAM total) by mouth daily.   Cholecalciferol (VITAMIN D3) 1000 units CAPS Take 2,000 Units by mouth.   Emollient (CERAVE DIABETICS DRY  SKIN) CREA Apply 1 Application topically 2 (two) times daily.   escitalopram (LEXAPRO) 20 MG tablet take 1 tablet once daily.   furosemide (LASIX) 20 MG tablet Take 1 tablet (20 mg total) by mouth daily. (Patient taking differently: Take 20 mg by mouth daily as needed for fluid or edema.)   levocetirizine (XYZAL) 5 MG tablet Take 5 mg by mouth every evening.   lisinopril (ZESTRIL) 5 MG tablet Take 5 mg by mouth daily.   meloxicam (MOBIC) 7.5 MG tablet Take 7.5 mg by mouth daily.   metoprolol tartrate (LOPRESSOR) 25 MG tablet take 1 tablet (25 MILLIGRAM total) by mouth 2 (two) times daily.   naloxone (NARCAN) nasal spray 4 mg/0.1 mL As needed or respiratory depression or decreased mental status related to narcotic use / overdose   nystatin (MYCOSTATIN/NYSTOP) powder Apply 1 application. topically 3 (three) times daily.   pantoprazole (PROTONIX) 40 MG tablet take 1 tablet (40 MILLIGRAM total) by mouth daily.   rosuvastatin (CRESTOR) 20 MG tablet Take 20 mg by mouth.   traZODone (DESYREL) 50 MG tablet take 1 tablet (50 MILLIGRAM total) by mouth at bedtime.   triamcinolone (KENALOG) 0.025 % cream Apply 1 Application topically 2 (two) times daily.   WIXELA INHUB 250-50 MCG/ACT AEPB inhale 1 puff into the lungs in the morning and at bedtime.   [DISCONTINUED] traMADol  (ULTRAM) 50 MG tablet Take 50 mg by mouth every 6 (six) hours as needed.   [START ON 08/26/2023] traMADol (ULTRAM) 50 MG tablet Take 1 tablet (50 mg total) by mouth every 6 (six) hours as needed.   [START ON 07/27/2023] traMADol (ULTRAM) 50 MG tablet Take 1 tablet (50 mg total) by mouth every 6 (six) hours as needed.   traMADol (ULTRAM) 50 MG tablet Take 1 tablet (50 mg total) by mouth every 6 (six) hours as needed.   [DISCONTINUED] traMADol (ULTRAM) 50 MG tablet Take 1 tablet (50 mg total) by mouth every 6 (six) hours as needed.   [DISCONTINUED] traMADol (ULTRAM) 50 MG tablet Take 1 tablet (50 mg total) by mouth every 6 (six) hours as needed.   [DISCONTINUED] traMADol (ULTRAM) 50 MG tablet Take 1 tablet (50 mg total) by mouth every 6 (six) hours as needed.   No facility-administered encounter medications on file as of 06/27/2023.    No Known Allergies  Pertinent ROS per HPI, otherwise unremarkable      Objective:  BP 135/65   Pulse (!) 57   Temp (!) 96.9 F (36.1 C)   Ht 5\' 4"  (1.626 m)   Wt 170 lb 3.2 oz (77.2 kg)   SpO2 (!) 89%   BMI 29.21 kg/m    Wt Readings from Last 3 Encounters:  06/27/23 170 lb 3.2 oz (77.2 kg)  06/19/23 169 lb (76.7 kg)  06/13/23 170 lb 9.6 oz (77.4 kg)    Physical Exam Vitals and nursing note reviewed.  Constitutional:      General: She is not in acute distress.    Appearance: Normal appearance. She is obese. She is ill-appearing (chronically ill). She is not toxic-appearing or diaphoretic.  HENT:     Head: Normocephalic and atraumatic.     Nose: Nose normal.     Mouth/Throat:     Mouth: Mucous membranes are moist.     Pharynx: Oropharynx is clear.  Eyes:     Conjunctiva/sclera: Conjunctivae normal.     Pupils: Pupils are equal, round, and reactive to light.  Cardiovascular:     Rate and  Rhythm: Normal rate and regular rhythm.     Heart sounds: Normal heart sounds.  Pulmonary:     Effort: Pulmonary effort is normal.     Breath sounds:  Normal breath sounds.  Musculoskeletal:     Right lower leg: No edema.     Left lower leg: No edema.  Skin:    General: Skin is warm and dry.     Capillary Refill: Capillary refill takes less than 2 seconds.     Findings: Rash (scattered dry patches) present.  Neurological:     General: No focal deficit present.     Mental Status: She is alert and oriented to person, place, and time.     Gait: Gait abnormal (antalgic, using rollator).  Psychiatric:        Mood and Affect: Mood normal.        Behavior: Behavior normal.        Thought Content: Thought content normal.        Judgment: Judgment normal.       Results for orders placed or performed during the hospital encounter of 05/20/23  Pulmonary function test   Collection Time: 05/20/23  2:45 PM  Result Value Ref Range   FVC-Pre 1.35 L   FVC-%Pred-Pre 50 %   FVC-Post 1.68 L   FVC-%Pred-Post 62 %   FVC-%Change-Post 24 %   FEV1-Pre 0.70 L   FEV1-%Pred-Pre 34 %   FEV1-Post 0.85 L   FEV1-%Pred-Post 42 %   FEV1-%Change-Post 21 %   FEV6-Pre 1.30 L   FEV6-%Pred-Pre 50 %   FEV6-Post 1.58 L   FEV6-%Pred-Post 61 %   FEV6-%Change-Post 21 %   Pre FEV1/FVC ratio 52 %   FEV1FVC-%Pred-Pre 69 %   Post FEV1/FVC ratio 51 %   FEV1FVC-%Change-Post -2 %   Pre FEV6/FVC Ratio 96 %   FEV6FVC-%Pred-Pre 101 %   Post FEV6/FVC ratio 94 %   FEV6FVC-%Pred-Post 99 %   FEV6FVC-%Change-Post -2 %   FEF 25-75 Pre 0.27 L/sec   FEF2575-%Pred-Pre 18 %   FEF 25-75 Post 0.48 L/sec   FEF2575-%Pred-Post 31 %   FEF2575-%Change-Post 76 %   RV 2.94 L   RV % pred 124 %   TLC 4.40 L   TLC % pred 87 %   DLCO unc 11.76 ml/min/mmHg   DLCO unc % pred 62 %   DL/VA 1.61 ml/min/mmHg/L   DL/VA % pred 096 %       Pertinent labs & imaging results that were available during my care of the patient were reviewed by me and considered in my medical decision making.  Assessment & Plan:  Denise Underwood was seen today for pain management.  Diagnoses and all orders for  this visit:  Primary osteoarthritis involving multiple joints -     Discontinue: traMADol (ULTRAM) 50 MG tablet; Take 1 tablet (50 mg total) by mouth every 6 (six) hours as needed. -     Discontinue: traMADol (ULTRAM) 50 MG tablet; Take 1 tablet (50 mg total) by mouth every 6 (six) hours as needed. -     Discontinue: traMADol (ULTRAM) 50 MG tablet; Take 1 tablet (50 mg total) by mouth every 6 (six) hours as needed. -     traMADol (ULTRAM) 50 MG tablet; Take 1 tablet (50 mg total) by mouth every 6 (six) hours as needed. -     traMADol (ULTRAM) 50 MG tablet; Take 1 tablet (50 mg total) by mouth every 6 (six) hours as needed. -  traMADol (ULTRAM) 50 MG tablet; Take 1 tablet (50 mg total) by mouth every 6 (six) hours as needed.  Controlled substance agreement signed -     Discontinue: traMADol (ULTRAM) 50 MG tablet; Take 1 tablet (50 mg total) by mouth every 6 (six) hours as needed. -     Discontinue: traMADol (ULTRAM) 50 MG tablet; Take 1 tablet (50 mg total) by mouth every 6 (six) hours as needed. -     Discontinue: traMADol (ULTRAM) 50 MG tablet; Take 1 tablet (50 mg total) by mouth every 6 (six) hours as needed. -     traMADol (ULTRAM) 50 MG tablet; Take 1 tablet (50 mg total) by mouth every 6 (six) hours as needed. -     traMADol (ULTRAM) 50 MG tablet; Take 1 tablet (50 mg total) by mouth every 6 (six) hours as needed. -     traMADol (ULTRAM) 50 MG tablet; Take 1 tablet (50 mg total) by mouth every 6 (six) hours as needed.  Psoriasis Fairly controlled, using topicals only.   COPD GOLD 3 Quit smoking and doing well.   Stage 3a chronic kidney disease (HCC) Followed by nephrology.   Acute on chronic diastolic heart failure (HCC) Asymptomatic.     Assessment and Plan    Chronic Pain Tramadol effectively manages chronic pain with no significant breakthrough pain or side effects. Takes medication twice daily and once as needed. Discussed importance of adherence to avoid withdrawal  symptoms. - Refill tramadol prescription  Congestive Heart Failure Mild leg swelling managed with Lasix. No chest pain, syncope, or orthopnea. Emphasized importance of Lasix for fluid management and symptom control. - Continue Lasix as prescribed - Follow up with cardiologist in 3-4 months  Chronic Kidney Disease Scheduled follow-up with nephrologist, Dr. Wolfgang Phoenix, in March. Will complete 24-hour urine collection and blood work prior to visit. Discussed importance of these tests for monitoring kidney function and treatment adjustment. - Complete 24-hour urine collection - Follow up with nephrologist in March  Psoriasis Mild flare-up three weeks ago managed with salve. Current treatment appears effective. Discussed importance of continued treatment and monitoring for changes. - Continue current salve treatment  Smoking Cessation Successfully quit smoking but concerned about weight gain. Advised healthier snacking options and increased physical activity. Discussed benefits of maintaining healthy weight and risks of weight gain. - Encourage healthier snacking options - Increase physical activity, such as walking  Pneumonia and Sepsis Completed pneumonia vaccines in 2020. No additional vaccinations required. Discussed importance of staying up to date with vaccinations to prevent future infections. - No additional vaccinations needed at this time  General Health Maintenance Vaccinations, including pneumonia, shingles, tetanus, and flu, are up to date. Discussed optional RSV vaccination. Emphasized importance of regular health maintenance. - No additional vaccinations needed at this time  Follow-up - Follow up with nephrologist in March - Follow up with cardiologist in 3-4 months.          Continue all other maintenance medications.  Follow up plan: Return in about 3 months (around 09/24/2023) for all labs.   Continue healthy lifestyle choices, including diet (rich in fruits,  vegetables, and lean proteins, and low in salt and simple carbohydrates) and exercise (at least 30 minutes of moderate physical activity daily).    The above assessment and management plan was discussed with the patient. The patient verbalized understanding of and has agreed to the management plan. Patient is aware to call the clinic if they develop any new symptoms or if symptoms persist  or worsen. Patient is aware when to return to the clinic for a follow-up visit. Patient educated on when it is appropriate to go to the emergency department.   Kari Baars, FNP-C [facility] [phone]

## 2023-06-30 ENCOUNTER — Telehealth: Payer: Self-pay | Admitting: Family Medicine

## 2023-06-30 NOTE — Telephone Encounter (Unsigned)
 Copied from CRM [id number]. Topic: Clinical - Medication Question >> Jun 30, 2023  4:35 PM Alcus Dad H wrote: Reason for CRM: Patient would like a call back to know why quantity for tramadol decreased to 60 tablets when she used to get 120 tablets, call back number [phone]

## 2023-06-30 NOTE — Telephone Encounter (Unsigned)
 Copied from CRM [id number]. Topic: Clinical - Prescription Issue >> Jun 30, 2023  4:38 PM Victorino Dike T wrote: Reason for CRM: traMADol (ULTRAM) 50 MG tablet  patient only received half of the normal amount for refill- please call patient [phone]

## 2023-07-01 ENCOUNTER — Telehealth: Payer: Self-pay

## 2023-07-01 NOTE — Telephone Encounter (Addendum)
 Referral placed but pt's daughter states she would like [facility] and I can't tell if this is what was on the original referral.

## 2023-07-01 NOTE — Telephone Encounter (Signed)
Aware and verbalizes understanding.  

## 2023-07-01 NOTE — Telephone Encounter (Signed)
 Spoke with patient and made patient aware of previous phone message. States that she has been on the Tramdol BID before and it does not touch her pain.  Would like to go back to 4 times a day where she was taking 2 pills in the am and 2 in the pm. Aware she might have to go to pain management.

## 2023-07-01 NOTE — Telephone Encounter (Signed)
 Placed [facility] in Notes of Referral as Patient requested.

## 2023-07-01 NOTE — Addendum Note (Signed)
 Addended by: Sonny Masters on: 07/01/2023 01:25 PM   Modules accepted: Orders

## 2023-07-01 NOTE — Telephone Encounter (Signed)
 Copied from CRM [id number]. Topic: Clinical - Prescription Issue >> Jul 01, 2023 11:08 AM Antony Haste wrote: Reason for CRM: PT states she would like of traMADol (ULTRAM) 50 MG tablet increased back to a quantity size of 120 instead of 60 tablets since she currently takes this medication 4 times a day, twice in the morning and twice at night for her pain. She states someone reached out to her daughter to discuss this change, but she has not received an update. She is requesting a callback from a nurse.  Callback [phone]

## 2023-07-07 ENCOUNTER — Other Ambulatory Visit: Payer: Self-pay | Admitting: Family Medicine

## 2023-07-07 DIAGNOSIS — R062 Wheezing: Secondary | ICD-10-CM

## 2023-07-08 ENCOUNTER — Other Ambulatory Visit: Payer: Self-pay | Admitting: Family Medicine

## 2023-07-08 DIAGNOSIS — F411 Generalized anxiety disorder: Secondary | ICD-10-CM

## 2023-07-13 DIAGNOSIS — J441 Chronic obstructive pulmonary disease with (acute) exacerbation: Secondary | ICD-10-CM | POA: Diagnosis not present

## 2023-07-13 DIAGNOSIS — I5033 Acute on chronic diastolic (congestive) heart failure: Secondary | ICD-10-CM | POA: Diagnosis not present

## 2023-07-13 DIAGNOSIS — G9341 Metabolic encephalopathy: Secondary | ICD-10-CM | POA: Diagnosis not present

## 2023-07-13 DIAGNOSIS — I4891 Unspecified atrial fibrillation: Secondary | ICD-10-CM | POA: Diagnosis not present

## 2023-07-19 DIAGNOSIS — M129 Arthropathy, unspecified: Secondary | ICD-10-CM | POA: Diagnosis not present

## 2023-07-19 DIAGNOSIS — Z79899 Other long term (current) drug therapy: Secondary | ICD-10-CM | POA: Diagnosis not present

## 2023-07-19 DIAGNOSIS — M170 Bilateral primary osteoarthritis of knee: Secondary | ICD-10-CM | POA: Diagnosis not present

## 2023-07-24 DIAGNOSIS — M170 Bilateral primary osteoarthritis of knee: Secondary | ICD-10-CM | POA: Diagnosis not present

## 2023-07-24 DIAGNOSIS — Z79899 Other long term (current) drug therapy: Secondary | ICD-10-CM | POA: Diagnosis not present

## 2023-08-06 ENCOUNTER — Other Ambulatory Visit: Payer: Self-pay | Admitting: Family Medicine

## 2023-08-06 DIAGNOSIS — R062 Wheezing: Secondary | ICD-10-CM

## 2023-08-13 DIAGNOSIS — J441 Chronic obstructive pulmonary disease with (acute) exacerbation: Secondary | ICD-10-CM | POA: Diagnosis not present

## 2023-08-13 DIAGNOSIS — G9341 Metabolic encephalopathy: Secondary | ICD-10-CM | POA: Diagnosis not present

## 2023-08-13 DIAGNOSIS — I4891 Unspecified atrial fibrillation: Secondary | ICD-10-CM | POA: Diagnosis not present

## 2023-08-13 DIAGNOSIS — I5033 Acute on chronic diastolic (congestive) heart failure: Secondary | ICD-10-CM | POA: Diagnosis not present

## 2023-08-25 DIAGNOSIS — Z79899 Other long term (current) drug therapy: Secondary | ICD-10-CM | POA: Diagnosis not present

## 2023-08-25 DIAGNOSIS — M170 Bilateral primary osteoarthritis of knee: Secondary | ICD-10-CM | POA: Diagnosis not present

## 2023-09-02 ENCOUNTER — Other Ambulatory Visit

## 2023-09-02 DIAGNOSIS — Z79899 Other long term (current) drug therapy: Secondary | ICD-10-CM | POA: Diagnosis not present

## 2023-09-03 ENCOUNTER — Other Ambulatory Visit

## 2023-09-04 ENCOUNTER — Other Ambulatory Visit: Payer: Self-pay | Admitting: *Deleted

## 2023-09-04 MED ORDER — LISINOPRIL 5 MG PO TABS: 5.0000 mg | ORAL_TABLET | Freq: Every day | ORAL | 2 refills | Status: DC

## 2023-09-07 ENCOUNTER — Other Ambulatory Visit: Payer: Self-pay | Admitting: Family Medicine

## 2023-09-07 DIAGNOSIS — G479 Sleep disorder, unspecified: Secondary | ICD-10-CM

## 2023-09-07 DIAGNOSIS — F331 Major depressive disorder, recurrent, moderate: Secondary | ICD-10-CM

## 2023-09-10 ENCOUNTER — Other Ambulatory Visit (HOSPITAL_COMMUNITY): Payer: Self-pay | Admitting: Nephrology

## 2023-09-10 DIAGNOSIS — I350 Nonrheumatic aortic (valve) stenosis: Secondary | ICD-10-CM | POA: Diagnosis not present

## 2023-09-10 DIAGNOSIS — I5032 Chronic diastolic (congestive) heart failure: Secondary | ICD-10-CM | POA: Diagnosis not present

## 2023-09-10 DIAGNOSIS — N1831 Chronic kidney disease, stage 3a: Secondary | ICD-10-CM

## 2023-09-10 DIAGNOSIS — I129 Hypertensive chronic kidney disease with stage 1 through stage 4 chronic kidney disease, or unspecified chronic kidney disease: Secondary | ICD-10-CM | POA: Diagnosis not present

## 2023-09-12 DIAGNOSIS — J441 Chronic obstructive pulmonary disease with (acute) exacerbation: Secondary | ICD-10-CM | POA: Diagnosis not present

## 2023-09-12 DIAGNOSIS — G9341 Metabolic encephalopathy: Secondary | ICD-10-CM | POA: Diagnosis not present

## 2023-09-12 DIAGNOSIS — I4891 Unspecified atrial fibrillation: Secondary | ICD-10-CM | POA: Diagnosis not present

## 2023-09-12 DIAGNOSIS — I5033 Acute on chronic diastolic (congestive) heart failure: Secondary | ICD-10-CM | POA: Diagnosis not present

## 2023-10-01 ENCOUNTER — Ambulatory Visit: Payer: Medicare HMO | Admitting: Family Medicine

## 2023-10-07 ENCOUNTER — Other Ambulatory Visit: Payer: Self-pay | Admitting: Family Medicine

## 2023-10-07 DIAGNOSIS — F411 Generalized anxiety disorder: Secondary | ICD-10-CM

## 2023-10-07 DIAGNOSIS — R062 Wheezing: Secondary | ICD-10-CM

## 2023-10-08 ENCOUNTER — Ambulatory Visit: Payer: Medicare HMO | Admitting: Cardiology

## 2023-10-08 NOTE — Progress Notes (Deleted)
 Cardiology Office Note:    Date:  10/08/2023   ID:  Denise Underwood, DOB February 17, 1946, MRN [id number]  PCP:  Galvin Jules, FNP  Cardiologist:  Wendie Hamburg, MD  Electrophysiologist:  None   Referring MD: Galvin Jules, FNP   No chief complaint on file. ***  History of Present Illness:    Denise Underwood is a 78 y.o. female with a hx of paroxysmal atrial fibrillation, aortic stenosis, HTN, HLD, prediabetes, COPD, tobacco use who presents for follow-up.  She was admitted 01/2023 with septic shock due to community acquired pneumonia.  Course was complicated by new Afib.  Echocardiogram 02/04/23 showed EF 60-65%, grade I DD, normal RV function, mid aortic valve stenosis. Spontaneously converted to sinus rhythm.  Past Medical History:  Diagnosis Date   Anxiety    Arthritis    Hyperlipidemia    Hypertension    Osteopenia 07/04/2021   Prediabetes 07/09/2021   Psoriasis     Past Surgical History:  Procedure Laterality Date   ABDOMINAL HYSTERECTOMY     CHOLECYSTECTOMY     HERNIA REPAIR     KNEE SURGERY     TUBAL LIGATION      Current Medications: No outpatient medications have been marked as taking for the 10/08/23 encounter (Appointment) with Wendie Hamburg, MD.     Allergies:   Patient has no known allergies.   Social History   Socioeconomic History   Marital status: Single    Spouse name: Not on file   Number of children: 2   Years of education: Not on file   Highest education level: 9th grade  Occupational History   Occupation: Retired  Tobacco Use   Smoking status: Former    Current packs/day: 0.50    Average packs/day: 0.5 packs/day for 40.0 years (20.0 ttl pk-yrs)    Types: Cigarettes   Smokeless tobacco: Never   Tobacco comments:    Smoking since she was 29 or younger    02/25/2023 Patient stop smoking 01/2023  Vaping Use   Vaping status: Never Used  Substance and Sexual Activity   Alcohol  use: No   Drug use: No   Sexual activity: Not  Currently    Birth control/protection: Surgical  Other Topics Concern   Not on file  Social History Narrative   She lives alone on ground level apartment - has children.    Daughter, Tyra Galley is her main caregiver and means for transportation.   She also has an aide that comes in for 2 hours on weekdays to help with bathing, meals, meds etc   Social Drivers of Health   Financial Resource Strain: Low Risk  (11/07/2022)   Overall Financial Resource Strain (CARDIA)    Difficulty of Paying Living Expenses: Not hard at all  Food Insecurity: No Food Insecurity (02/18/2023)   Hunger Vital Sign    Worried About Running Out of Food in the Last Year: Never true    Ran Out of Food in the Last Year: Never true  Transportation Needs: No Transportation Needs (02/18/2023)   PRAPARE - Administrator, Civil Service (Medical): No    Lack of Transportation (Non-Medical): No  Physical Activity: Insufficiently Active (11/07/2022)   Exercise Vital Sign    Days of Exercise per Week: 3 days    Minutes of Exercise per Session: 30 min  Stress: No Stress Concern Present (11/07/2022)   [employer] of Occupational Health - Occupational Stress Questionnaire    Feeling of  Stress : Not at all  Social Connections: Socially Isolated (11/07/2022)   Social Connection and Isolation Panel [NHANES]    Frequency of Communication with Friends and Family: More than three times a week    Frequency of Social Gatherings with Friends and Family: More than three times a week    Attends Religious Services: Never    Database administrator or Organizations: No    Attends Banker Meetings: Never    Marital Status: Widowed     Family History: The patient's ***family history is not on file.  ROS:   Please see the history of present illness.    *** All other systems reviewed and are negative.  EKGs/Labs/Other Studies Reviewed:    The following studies were reviewed today: ***  EKG:  EKG is ***  ordered today.  The ekg ordered today demonstrates ***  Recent Labs: 02/19/2023: BNP 214.9; Magnesium  1.5 03/28/2023: ALT 8; BUN 25; Creatinine, Ser 1.53; Potassium 4.6; Sodium 144; TSH 2.350 04/21/2023: Hemoglobin 11.9; Platelets 231  Recent Lipid Panel    Component Value Date/Time   CHOL 147 03/28/2023 0848   TRIG 229 (H) 03/28/2023 0848   HDL 46 03/28/2023 0848   CHOLHDL 3.2 03/28/2023 0848   LDLCALC 64 03/28/2023 0848    Physical Exam:    VS:  There were no vitals taken for this visit.    Wt Readings from Last 3 Encounters:  06/27/23 170 lb 3.2 oz (77.2 kg)  06/19/23 169 lb (76.7 kg)  06/13/23 170 lb 9.6 oz (77.4 kg)     GEN: *** Well nourished, well developed in no acute distress HEENT: Normal NECK: No JVD; No carotid bruits LYMPHATICS: No lymphadenopathy CARDIAC: ***RRR, no murmurs, rubs, gallops RESPIRATORY:  Clear to auscultation without rales, wheezing or rhonchi  ABDOMEN: Soft, non-tender, non-distended MUSCULOSKELETAL:  No edema; No deformity  SKIN: Warm and dry NEUROLOGIC:  Alert and oriented x 3 PSYCHIATRIC:  Normal affect   ASSESSMENT:    No diagnosis found. PLAN:    Paroxysmal atrial fibrillation: She was admitted 01/2023 with septic shock due to community acquired pneumonia.  Course was complicated by new Afib.  Echocardiogram 02/04/23 showed EF 60-65%, grade I DD, normal RV function, mid aortic valve stenosis. Spontaneously converted to sinus rhythm. -Continue Eliquis  5 mg BID -Continue metoprolol  25 mg BID, diltiazem  180 mg daily  Chronic diastolic heart failure: on lasix  20 mg daily  Aortic stenosis: mild on echo 01/223, will monitor  CKD stage 3b: Follows with nephrology, Cr 1.5 on 03/2023  Hypertension: on metoprolol , diltiazem   Hyperlipidemia: LDL 64 on 03/2023.  Previously on rosuvastatin  but held in setting of transaminitis from sepsis***  RTC in ***   Medication Adjustments/Labs and Tests Ordered: Current medicines are reviewed at  length with the patient today.  Concerns regarding medicines are outlined above.  No orders of the defined types were placed in this encounter.  No orders of the defined types were placed in this encounter.   There are no Patient Instructions on file for this visit.   Signed, Wendie Hamburg, MD  10/08/2023 10:59 AM    [facility]

## 2023-10-13 DIAGNOSIS — I4891 Unspecified atrial fibrillation: Secondary | ICD-10-CM | POA: Diagnosis not present

## 2023-10-13 DIAGNOSIS — G9341 Metabolic encephalopathy: Secondary | ICD-10-CM | POA: Diagnosis not present

## 2023-10-13 DIAGNOSIS — I5033 Acute on chronic diastolic (congestive) heart failure: Secondary | ICD-10-CM | POA: Diagnosis not present

## 2023-10-13 DIAGNOSIS — J441 Chronic obstructive pulmonary disease with (acute) exacerbation: Secondary | ICD-10-CM | POA: Diagnosis not present

## 2023-10-14 ENCOUNTER — Encounter: Payer: Self-pay | Admitting: Family Medicine

## 2023-10-14 ENCOUNTER — Ambulatory Visit: Admitting: Family Medicine

## 2023-10-14 VITALS — BP 111/63 | HR 84 | Temp 97.4°F | Ht 64.0 in | Wt 176.0 lb

## 2023-10-14 DIAGNOSIS — Z79899 Other long term (current) drug therapy: Secondary | ICD-10-CM

## 2023-10-14 DIAGNOSIS — B372 Candidiasis of skin and nail: Secondary | ICD-10-CM | POA: Diagnosis not present

## 2023-10-14 DIAGNOSIS — I5033 Acute on chronic diastolic (congestive) heart failure: Secondary | ICD-10-CM

## 2023-10-14 DIAGNOSIS — M150 Primary generalized (osteo)arthritis: Secondary | ICD-10-CM

## 2023-10-14 DIAGNOSIS — F331 Major depressive disorder, recurrent, moderate: Secondary | ICD-10-CM | POA: Diagnosis not present

## 2023-10-14 DIAGNOSIS — I4891 Unspecified atrial fibrillation: Secondary | ICD-10-CM | POA: Diagnosis not present

## 2023-10-14 DIAGNOSIS — F411 Generalized anxiety disorder: Secondary | ICD-10-CM | POA: Diagnosis not present

## 2023-10-14 DIAGNOSIS — N1831 Chronic kidney disease, stage 3a: Secondary | ICD-10-CM | POA: Diagnosis not present

## 2023-10-14 DIAGNOSIS — G479 Sleep disorder, unspecified: Secondary | ICD-10-CM

## 2023-10-14 DIAGNOSIS — L03115 Cellulitis of right lower limb: Secondary | ICD-10-CM

## 2023-10-14 LAB — LIPID PANEL

## 2023-10-14 MED ORDER — FLUCONAZOLE 200 MG PO TABS: 200.0000 mg | ORAL_TABLET | ORAL | 0 refills | Status: AC

## 2023-10-14 MED ORDER — ESCITALOPRAM OXALATE 20 MG PO TABS: 20.0000 mg | ORAL_TABLET | Freq: Every day | ORAL | 1 refills | Status: DC

## 2023-10-14 MED ORDER — METOPROLOL TARTRATE 25 MG PO TABS: 25.0000 mg | ORAL_TABLET | Freq: Two times a day (BID) | ORAL | 1 refills | Status: DC

## 2023-10-14 MED ORDER — FUROSEMIDE 20 MG PO TABS: 20.0000 mg | ORAL_TABLET | Freq: Every day | ORAL | 1 refills | Status: DC

## 2023-10-14 MED ORDER — TRIAMCINOLONE ACETONIDE 0.1 % EX CREA: 1.0000 | TOPICAL_CREAM | Freq: Two times a day (BID) | CUTANEOUS | 0 refills | Status: DC

## 2023-10-14 MED ORDER — DOXYCYCLINE HYCLATE 100 MG PO TABS: 100.0000 mg | ORAL_TABLET | Freq: Two times a day (BID) | ORAL | 0 refills | Status: DC

## 2023-10-14 MED ORDER — NYSTATIN 100000 UNIT/GM EX POWD: 1.0000 | Freq: Three times a day (TID) | CUTANEOUS | 0 refills | Status: AC

## 2023-10-14 MED ORDER — TRAZODONE HCL 50 MG PO TABS: 50.0000 mg | ORAL_TABLET | Freq: Every day | ORAL | 0 refills | Status: DC

## 2023-10-14 NOTE — Progress Notes (Signed)
 Subjective:  Patient ID: Denise Underwood, female    DOB: [birth year], 78 y.o.   MRN: [id number]  Patient Care Team: Galvin Jules, FNP as PCP - General (Family Medicine) Wendie Hamburg, MD as PCP - Cardiology (Cardiology) Magrinat, Rozella Cornfield, MD (Inactive) as Consulting Physician (Oncology) Veda Gerald, MD (Internal Medicine) Otelia Blew Mevelyn Acton, MD as Consulting Physician (Internal Medicine) Diamond Formica, MD as Consulting Physician (Pulmonary Disease)   Chief Complaint:  Medical Management of Chronic Issues (3 month follow up. Still having chronic bilateral knee pain ) and Rash (Right lower leg- x 2-3 days ago. States that it itches )   HPI: Denise Underwood is a 78 y.o. female presenting on 10/14/2023 for Medical Management of Chronic Issues (3 month follow up. Still having chronic bilateral knee pain ) and Rash (Right lower leg- x 2-3 days ago. States that it itches )   Denise Underwood is a 78 year old female who presents with a rash on the back of her leg.  She has had a rash on the back of her leg for about a week, describing it as sometimes painful and 'angry'. She has not been outside much, and when asked about itching, she mentioned 'it's there'.  She has a history of heart failure and takes a diuretic daily, which helps with her leg swelling. She is scheduled to see her cardiologist on the 18th and sees her every six months. She also has an upcoming appointment with her nephrologist.  Regarding her atrial fibrillation, no palpitations, severe shortness of breath, or near syncope. She is on a medication regimen that includes Lexapro  20 mg daily for anxiety and depression, which she takes as prescribed. She feels 'so-so' with her mood, attributing some of her depression to her daughter moving away for work.  She is under pain management and has switched from tramadol  to hydrocodone 5/325 mg, which she takes as directed. Her pain is better managed with this regimen and she  sees pain management monthly.  She also reports a yeast infection under her breast and pannus, which has been present for a while and sometimes prevents her from wearing a bra. She describes the area as 'pretty rough' and 'beefy red'. She has been using nystatin  cream and powder for this condition.         10/14/2023    8:56 AM 06/27/2023    9:04 AM 03/28/2023    8:32 AM 11/19/2022   10:11 AM 11/07/2022    1:25 PM  Depression screen PHQ 2/9  Decreased Interest 1 1 1  0 0  Down, Depressed, Hopeless 2 1 2 1  0  PHQ - 2 Score 3 2 3 1  0  Altered sleeping 1 1 1 1  0  Tired, decreased energy 2 2 0 1 0  Change in appetite 1 1 1 1  0  Feeling bad or failure about yourself  2 0 2 0 0  Trouble concentrating 1 1 1  0 0  Moving slowly or fidgety/restless 1 2 1 1  0  Suicidal thoughts 0 0 0 0 0  PHQ-9 Score 11 9 9 5  0  Difficult doing work/chores Not difficult at all Somewhat difficult Somewhat difficult Somewhat difficult Not difficult at all      10/14/2023    8:56 AM 03/28/2023    8:32 AM 11/19/2022   10:11 AM 09/17/2022    8:32 AM  GAD 7 : Generalized Anxiety Score  Nervous, Anxious, on Edge 1 1  2 2  Control/stop worrying 0 1 1 2   Worry too much - different things 1 2 0 1  Trouble relaxing 1 2 1 2   Restless 1 2 0 1  Easily annoyed or irritable 1 2 0 2  Afraid - awful might happen 0 2 1 0  Total GAD 7 Score 5 12 5 10   Anxiety Difficulty Somewhat difficult Somewhat difficult Somewhat difficult Somewhat difficult        Relevant past medical, surgical, family, and social history reviewed and updated as indicated.  Allergies and medications reviewed and updated. Data reviewed: Chart in Epic.   Past Medical History:  Diagnosis Date   Anxiety    Arthritis    Hyperlipidemia    Hypertension    Osteopenia 07/04/2021   Prediabetes 07/09/2021   Psoriasis     Past Surgical History:  Procedure Laterality Date   ABDOMINAL HYSTERECTOMY     CHOLECYSTECTOMY     HERNIA REPAIR     KNEE SURGERY      TUBAL LIGATION      Social History   Socioeconomic History   Marital status: Single    Spouse name: Not on file   Number of children: 2   Years of education: Not on file   Highest education level: 9th grade  Occupational History   Occupation: Retired  Tobacco Use   Smoking status: Former    Current packs/day: 0.50    Average packs/day: 0.5 packs/day for 40.0 years (20.0 ttl pk-yrs)    Types: Cigarettes   Smokeless tobacco: Never   Tobacco comments:    Smoking since she was 29 or younger    02/25/2023 Patient stop smoking 01/2023  Vaping Use   Vaping status: Never Used  Substance and Sexual Activity   Alcohol  use: No   Drug use: No   Sexual activity: Not Currently    Birth control/protection: Surgical  Other Topics Concern   Not on file  Social History Narrative   She lives alone on ground level apartment - has children.    Daughter, Tyra Galley is her main caregiver and means for transportation.   She also has an aide that comes in for 2 hours on weekdays to help with bathing, meals, meds etc   Social Drivers of Health   Financial Resource Strain: Low Risk  (11/07/2022)   Overall Financial Resource Strain (CARDIA)    Difficulty of Paying Living Expenses: Not hard at all  Food Insecurity: No Food Insecurity (02/18/2023)   Hunger Vital Sign    Worried About Running Out of Food in the Last Year: Never true    Ran Out of Food in the Last Year: Never true  Transportation Needs: No Transportation Needs (02/18/2023)   PRAPARE - Administrator, Civil Service (Medical): No    Lack of Transportation (Non-Medical): No  Physical Activity: Insufficiently Active (11/07/2022)   Exercise Vital Sign    Days of Exercise per Week: 3 days    Minutes of Exercise per Session: 30 min  Stress: No Stress Concern Present (11/07/2022)   [employer] of Occupational Health - Occupational Stress Questionnaire    Feeling of Stress : Not at all  Social Connections: Socially Isolated  (11/07/2022)   Social Connection and Isolation Panel [NHANES]    Frequency of Communication with Friends and Family: More than three times a week    Frequency of Social Gatherings with Friends and Family: More than three times a week    Attends Religious Services:  Never    Active Member of Clubs or Organizations: No    Attends Banker Meetings: Never    Marital Status: Widowed  Intimate Partner Violence: Not At Risk (02/04/2023)   Humiliation, Afraid, Rape, and Kick questionnaire    Fear of Current or Ex-Partner: No    Emotionally Abused: No    Physically Abused: No    Sexually Abused: No    Outpatient Encounter Medications as of 10/14/2023  Medication Sig   albuterol  (VENTOLIN  HFA) 108 (90 Base) MCG/ACT inhaler inhale 2 puffs into the lungs every 6 hours as needed.   apixaban  (ELIQUIS ) 5 MG TABS tablet Take 1 tablet (5 mg total) by mouth 2 (two) times daily.   CALCIUM  PO Take 1 tablet by mouth daily.   Cholecalciferol (VITAMIN D3) 1000 units CAPS Take 2,000 Units by mouth.   diltiazem  (CARTIA  XT) 180 MG 24 hr capsule take 1 capsule (180 milligram total) by mouth daily.   doxycycline (VIBRA-TABS) 100 MG tablet Take 1 tablet (100 mg total) by mouth 2 (two) times daily for 10 days. 1 po bid   Emollient (CERAVE DIABETICS DRY SKIN) CREA Apply 1 Application topically 2 (two) times daily.   fluconazole  (DIFLUCAN ) 200 MG tablet Take 1 tablet (200 mg total) by mouth once a week for 4 doses.   fluticasone -salmeterol (ADVAIR) 250-50 MCG/ACT AEPB inhale 1 puff into the lungs in the morning and at bedtime.   HYDROcodone-acetaminophen  (NORCO/VICODIN) 5-325 MG tablet Take 1 tablet by mouth every 6 (six) hours as needed for moderate pain (pain score 4-6).   levocetirizine (XYZAL ) 5 MG tablet Take 5 mg by mouth every evening.   lisinopril  (ZESTRIL ) 5 MG tablet Take 1 tablet (5 mg total) by mouth daily.   meloxicam  (MOBIC ) 7.5 MG tablet Take 7.5 mg by mouth daily.   naloxone  (NARCAN ) nasal  spray 4 mg/0.1 mL As needed or respiratory depression or decreased mental status related to narcotic use / overdose   pantoprazole  (PROTONIX ) 40 MG tablet take 1 tablet (40 milligram total) by mouth daily.   rosuvastatin  (CRESTOR ) 20 MG tablet Take 20 mg by mouth.   triamcinolone  cream (KENALOG ) 0.1 % Apply 1 Application topically 2 (two) times daily.   [DISCONTINUED] nystatin  (MYCOSTATIN /NYSTOP ) powder Apply 1 application. topically 3 (three) times daily.   [DISCONTINUED] traMADol  (ULTRAM ) 50 MG tablet Take 1 tablet (50 mg total) by mouth every 6 (six) hours as needed.   [DISCONTINUED] traMADol  (ULTRAM ) 50 MG tablet Take 1 tablet (50 mg total) by mouth every 6 (six) hours as needed.   [DISCONTINUED] traMADol  (ULTRAM ) 50 MG tablet Take 1 tablet (50 mg total) by mouth every 6 (six) hours as needed.   [DISCONTINUED] triamcinolone  (KENALOG ) 0.025 % cream Apply 1 Application topically 2 (two) times daily.   escitalopram  (LEXAPRO ) 20 MG tablet Take 1 tablet (20 mg total) by mouth daily.   furosemide  (LASIX ) 20 MG tablet Take 1 tablet (20 mg total) by mouth daily.   metoprolol  tartrate (LOPRESSOR ) 25 MG tablet Take 1 tablet (25 mg total) by mouth 2 (two) times daily.   nystatin  (MYCOSTATIN /NYSTOP ) powder Apply 1 Application topically 3 (three) times daily.   traZODone  (DESYREL ) 50 MG tablet Take 1 tablet (50 mg total) by mouth at bedtime.   [DISCONTINUED] escitalopram  (LEXAPRO ) 20 MG tablet take 1 tablet once daily.   [DISCONTINUED] furosemide  (LASIX ) 20 MG tablet take 1 tablet (20 milligram total) by mouth daily.   [DISCONTINUED] metoprolol  tartrate (LOPRESSOR ) 25 MG tablet take 1 tablet (  25 milligram total) by mouth 2 (two) times daily.   [DISCONTINUED] traZODone  (DESYREL ) 50 MG tablet take 1 tablet (50 milligram total) by mouth at bedtime.   No facility-administered encounter medications on file as of 10/14/2023.    No Known Allergies  Pertinent ROS per HPI, otherwise unremarkable       Objective:  BP 111/63 (Cuff Size: Normal)   Pulse 84   Temp (!) 97.4 F (36.3 C)   Ht 5\' 4"  (1.626 m)   Wt 176 lb (79.8 kg)   SpO2 91%   BMI 30.21 kg/m    Wt Readings from Last 3 Encounters:  10/14/23 176 lb (79.8 kg)  06/27/23 170 lb 3.2 oz (77.2 kg)  06/19/23 169 lb (76.7 kg)    Physical Exam Vitals and nursing note reviewed.  Constitutional:      General: She is not in acute distress.    Appearance: She is obese. She is ill-appearing (chronically ill). She is not toxic-appearing or diaphoretic.  HENT:     Head: Normocephalic and atraumatic.     Nose: Nose normal.     Mouth/Throat:     Mouth: Mucous membranes are moist.     Pharynx: Oropharynx is clear.  Eyes:     Conjunctiva/sclera: Conjunctivae normal.     Pupils: Pupils are equal, round, and reactive to light.  Cardiovascular:     Rate and Rhythm: Normal rate. Rhythm irregularly irregular.     Heart sounds: Normal heart sounds.  Pulmonary:     Effort: Pulmonary effort is normal.  Abdominal:     General: Bowel sounds are normal.     Palpations: Abdomen is soft.  Musculoskeletal:     Right lower leg: 1+ Edema present.     Left lower leg: 1+ Edema present.  Skin:    General: Skin is warm and dry.     Capillary Refill: Capillary refill takes less than 2 seconds.     Findings: Rash (scattered dry plaques) present.       Neurological:     General: No focal deficit present.     Mental Status: She is alert and oriented to person, place, and time.     Cranial Nerves: No cranial nerve deficit.     Motor: No weakness.     Gait: Gait abnormal (using rolling walker).  Psychiatric:        Mood and Affect: Mood normal.        Behavior: Behavior normal. Behavior is cooperative.        Thought Content: Thought content normal.        Judgment: Judgment normal.    Physical Exam   SKIN: Rash on leg, red and inflamed, early cellulitis. Yeast infection under breast and pannus, beefy red and aggravated.         Results for orders placed or performed during the hospital encounter of 05/20/23  Pulmonary function test   Collection Time: 05/20/23  2:45 PM  Result Value Ref Range   FVC-Pre 1.35 L   FVC-%Pred-Pre 50 %   FVC-Post 1.68 L   FVC-%Pred-Post 62 %   FVC-%Change-Post 24 %   FEV1-Pre 0.70 L   FEV1-%Pred-Pre 34 %   FEV1-Post 0.85 L   FEV1-%Pred-Post 42 %   FEV1-%Change-Post 21 %   FEV6-Pre 1.30 L   FEV6-%Pred-Pre 50 %   FEV6-Post 1.58 L   FEV6-%Pred-Post 61 %   FEV6-%Change-Post 21 %   Pre FEV1/FVC ratio 52 %   FEV1FVC-%Pred-Pre 69 %  Post FEV1/FVC ratio 51 %   FEV1FVC-%Change-Post -2 %   Pre FEV6/FVC Ratio 96 %   FEV6FVC-%Pred-Pre 101 %   Post FEV6/FVC ratio 94 %   FEV6FVC-%Pred-Post 99 %   FEV6FVC-%Change-Post -2 %   FEF 25-75 Pre 0.27 L/sec   FEF2575-%Pred-Pre 18 %   FEF 25-75 Post 0.48 L/sec   FEF2575-%Pred-Post 31 %   FEF2575-%Change-Post 76 %   RV 2.94 L   RV % pred 124 %   TLC 4.40 L   TLC % pred 87 %   DLCO unc 11.76 ml/min/mmHg   DLCO unc % pred 62 %   DL/VA 1.61 ml/min/mmHg/L   DL/VA % pred 096 %       Pertinent labs & imaging results that were available during my care of the patient were reviewed by me and considered in my medical decision making.  Assessment & Plan:  Daysie was seen today for medical management of chronic issues and rash.  Diagnoses and all orders for this visit:  Moderate episode of recurrent major depressive disorder (HCC) -     escitalopram  (LEXAPRO ) 20 MG tablet; Take 1 tablet (20 mg total) by mouth daily. -     traZODone  (DESYREL ) 50 MG tablet; Take 1 tablet (50 mg total) by mouth at bedtime. -     Thyroid  Panel With TSH -     CBC with Differential/Platelet  Acute on chronic diastolic heart failure (HCC) -     furosemide  (LASIX ) 20 MG tablet; Take 1 tablet (20 mg total) by mouth daily. -     metoprolol  tartrate (LOPRESSOR ) 25 MG tablet; Take 1 tablet (25 mg total) by mouth 2 (two) times daily. -     CBC with  Differential/Platelet -     CMP14+EGFR -     Lipid panel  Stage 3a chronic kidney disease (HCC) -     CBC with Differential/Platelet -     CMP14+EGFR -     VITAMIN D  25 Hydroxy (Vit-D Deficiency, Fractures)  GAD (generalized anxiety disorder) -     escitalopram  (LEXAPRO ) 20 MG tablet; Take 1 tablet (20 mg total) by mouth daily. -     Thyroid  Panel With TSH -     CBC with Differential/Platelet  Atrial fibrillation with RVR (HCC) -     metoprolol  tartrate (LOPRESSOR ) 25 MG tablet; Take 1 tablet (25 mg total) by mouth 2 (two) times daily. -     Thyroid  Panel With TSH -     CBC with Differential/Platelet -     CMP14+EGFR -     Lipid panel  Difficulty sleeping -     traZODone  (DESYREL ) 50 MG tablet; Take 1 tablet (50 mg total) by mouth at bedtime. -     Thyroid  Panel With TSH  Primary osteoarthritis involving multiple joints -     CMP14+EGFR  Cellulitis of right lower extremity -     doxycycline (VIBRA-TABS) 100 MG tablet; Take 1 tablet (100 mg total) by mouth 2 (two) times daily for 10 days. 1 po bid -     triamcinolone  cream (KENALOG ) 0.1 %; Apply 1 Application topically 2 (two) times daily.  Candidal intertrigo -     nystatin  (MYCOSTATIN /NYSTOP ) powder; Apply 1 Application topically 3 (three) times daily. -     fluconazole  (DIFLUCAN ) 200 MG tablet; Take 1 tablet (200 mg total) by mouth once a week for 4 doses.       Cellulitis Early cellulitis on the back of the leg, present for  about a week, with redness and inflammation indicating a skin infection. - Prescribe antibiotics for cellulitis - Prescribe triamcinolone  (Kenalog ) cream for topical application on the leg - Advise to report if the rash does not improve  Candidiasis Candidiasis under the breast and pannus, described as beefy red and aggravated, causing difficulty wearing a bra. - Prescribe Diflucan  200 mg once weekly for four weeks - Prescribe nystatin  powder for topical application under the breast and pannus -  Advise to report if the infection does not improve  Chronic Heart Failure Chronic heart failure with well-managed leg swelling, controlled with daily diuretic use. No changes in symptoms reported.  Atrial Fibrillation Atrial fibrillation is well-controlled with no recent palpitations, syncope, or worsening dyspnea.  Chronic Pain Chronic pain managed with hydrocodone 5/325 mg, with reported improvement in pain management. Discussed potential for injections with the pain management team. - Continue hydrocodone 5/325 mg as prescribed - Discuss potential for injections with pain management team  Anxiety and Depression Anxiety and depression managed with Lexapro  20 mg daily. Reports situational depression due to a family member moving away, but continues medication as prescribed.  General Health Maintenance Routine lab work to monitor overall health status, including white blood cell count, electrolytes, thyroid  function, vitamin D , and kidney function. Results to be shared with nephrology. - Order lab work to assess white blood cell count, electrolytes, thyroid  function, vitamin D , and kidney function  Follow-up She is seeing multiple specialists including cardiology, nephrology, pulmonology, and pain management. - Follow up with cardiology on June 18 - Follow up with nephrology tomorrow - Follow up with pain management on June 14 - Schedule next appointment with family practice in six months unless issues arise           Continue all other maintenance medications.  Follow up plan: Return in about 6 months (around 04/14/2024), or if symptoms worsen or fail to improve, for chronic follow up.   Continue healthy lifestyle choices, including diet (rich in fruits, vegetables, and lean proteins, and low in salt and simple carbohydrates) and exercise (at least 30 minutes of moderate physical activity daily).  Educational handout given for health maintenance   The above assessment and  management plan was discussed with the patient. The patient verbalized understanding of and has agreed to the management plan. Patient is aware to call the clinic if they develop any new symptoms or if symptoms persist or worsen. Patient is aware when to return to the clinic for a follow-up visit. Patient educated on when it is appropriate to go to the emergency department.   Kattie Parrot, FNP-C [facility] [phone]

## 2023-10-15 ENCOUNTER — Ambulatory Visit: Payer: Self-pay | Admitting: Family Medicine

## 2023-10-15 ENCOUNTER — Ambulatory Visit (HOSPITAL_COMMUNITY)

## 2023-10-15 LAB — CMP14+EGFR
ALT: 13 IU/L (ref 0–32)
AST: 22 IU/L (ref 0–40)
Albumin: 4 g/dL (ref 3.8–4.8)
Alkaline Phosphatase: 99 IU/L (ref 44–121)
BUN/Creatinine Ratio: 22 (ref 12–28)
BUN: 31 mg/dL — ABNORMAL HIGH (ref 8–27)
Bilirubin Total: 0.3 mg/dL (ref 0.0–1.2)
CO2: 19 mmol/L — ABNORMAL LOW (ref 20–29)
Calcium: 9.5 mg/dL (ref 8.7–10.3)
Chloride: 101 mmol/L (ref 96–106)
Creatinine, Ser: 1.38 mg/dL — ABNORMAL HIGH (ref 0.57–1.00)
Globulin, Total: 2.6 g/dL (ref 1.5–4.5)
Glucose: 90 mg/dL (ref 70–99)
Potassium: 4.8 mmol/L (ref 3.5–5.2)
Sodium: 141 mmol/L (ref 134–144)
Total Protein: 6.6 g/dL (ref 6.0–8.5)
eGFR: 39 mL/min/{1.73_m2} — ABNORMAL LOW (ref >59)

## 2023-10-15 LAB — CBC WITH DIFFERENTIAL/PLATELET
Basophils Absolute: 0.1 10*3/uL (ref 0.0–0.2)
Basos: 1 %
EOS (ABSOLUTE): 0.7 10*3/uL — ABNORMAL HIGH (ref 0.0–0.4)
Eos: 6 %
Hematocrit: 37 % (ref 34.0–46.6)
Hemoglobin: 12.2 g/dL (ref 11.1–15.9)
Immature Grans (Abs): 0.1 10*3/uL (ref 0.0–0.1)
Immature Granulocytes: 1 %
Lymphocytes Absolute: 2.8 10*3/uL (ref 0.7–3.1)
Lymphs: 24 %
MCH: 33.8 pg — ABNORMAL HIGH (ref 26.6–33.0)
MCHC: 33 g/dL (ref 31.5–35.7)
MCV: 103 fL — ABNORMAL HIGH (ref 79–97)
Monocytes Absolute: 0.9 10*3/uL (ref 0.1–0.9)
Monocytes: 8 %
Neutrophils Absolute: 7.1 10*3/uL — ABNORMAL HIGH (ref 1.4–7.0)
Neutrophils: 60 %
Platelets: 201 10*3/uL (ref 150–450)
RBC: 3.61 x10E6/uL — ABNORMAL LOW (ref 3.77–5.28)
RDW: 12.4 % (ref 11.7–15.4)
WBC: 11.7 10*3/uL — ABNORMAL HIGH (ref 3.4–10.8)

## 2023-10-15 LAB — LIPID PANEL
Cholesterol, Total: 257 mg/dL — ABNORMAL HIGH (ref 100–199)
HDL: 48 mg/dL (ref >39)
LDL CALC COMMENT:: 5.4 ratio — ABNORMAL HIGH (ref 0.0–4.4)
LDL Chol Calc (NIH): 166 mg/dL — ABNORMAL HIGH (ref 0–99)
Triglycerides: 231 mg/dL — ABNORMAL HIGH (ref 0–149)
VLDL Cholesterol Cal: 43 mg/dL — ABNORMAL HIGH (ref 5–40)

## 2023-10-15 LAB — THYROID PANEL WITH TSH
Free Thyroxine Index: 2.1 (ref 1.2–4.9)
T3 Uptake Ratio: 27 % (ref 24–39)
T4, Total: 7.7 ug/dL (ref 4.5–12.0)
TSH: 2.74 u[IU]/mL (ref 0.450–4.500)

## 2023-10-15 LAB — VITAMIN D 25 HYDROXY (VIT D DEFICIENCY, FRACTURES): Vit D, 25-Hydroxy: 30.8 ng/mL (ref 30.0–100.0)

## 2023-10-19 DIAGNOSIS — I872 Venous insufficiency (chronic) (peripheral): Secondary | ICD-10-CM | POA: Diagnosis not present

## 2023-10-19 DIAGNOSIS — L03119 Cellulitis of unspecified part of limb: Secondary | ICD-10-CM | POA: Diagnosis not present

## 2023-10-19 DIAGNOSIS — M7989 Other specified soft tissue disorders: Secondary | ICD-10-CM | POA: Diagnosis not present

## 2023-10-22 ENCOUNTER — Inpatient Hospital Stay (HOSPITAL_COMMUNITY)
Admission: EM | Admit: 2023-10-22 | Discharge: 2023-10-28 | DRG: 872 | Disposition: A | Discharge status: Home / Self Care | Attending: Family Medicine | Admitting: Family Medicine

## 2023-10-22 ENCOUNTER — Other Ambulatory Visit: Payer: Self-pay

## 2023-10-22 ENCOUNTER — Emergency Department (HOSPITAL_COMMUNITY)

## 2023-10-22 ENCOUNTER — Encounter (HOSPITAL_COMMUNITY): Payer: Self-pay

## 2023-10-22 DIAGNOSIS — M7989 Other specified soft tissue disorders: Secondary | ICD-10-CM | POA: Diagnosis not present

## 2023-10-22 DIAGNOSIS — L03119 Cellulitis of unspecified part of limb: Secondary | ICD-10-CM | POA: Diagnosis not present

## 2023-10-22 DIAGNOSIS — I4891 Unspecified atrial fibrillation: Secondary | ICD-10-CM | POA: Diagnosis present

## 2023-10-22 DIAGNOSIS — I10 Essential (primary) hypertension: Secondary | ICD-10-CM | POA: Diagnosis not present

## 2023-10-22 DIAGNOSIS — Z8249 Family history of ischemic heart disease and other diseases of the circulatory system: Secondary | ICD-10-CM

## 2023-10-22 DIAGNOSIS — Z79899 Other long term (current) drug therapy: Secondary | ICD-10-CM

## 2023-10-22 DIAGNOSIS — M8580 Other specified disorders of bone density and structure, unspecified site: Secondary | ICD-10-CM | POA: Diagnosis not present

## 2023-10-22 DIAGNOSIS — L409 Psoriasis, unspecified: Secondary | ICD-10-CM | POA: Diagnosis not present

## 2023-10-22 DIAGNOSIS — N189 Chronic kidney disease, unspecified: Secondary | ICD-10-CM | POA: Diagnosis present

## 2023-10-22 DIAGNOSIS — L270 Generalized skin eruption due to drugs and medicaments taken internally: Secondary | ICD-10-CM | POA: Diagnosis present

## 2023-10-22 DIAGNOSIS — E785 Hyperlipidemia, unspecified: Secondary | ICD-10-CM | POA: Diagnosis not present

## 2023-10-22 DIAGNOSIS — Z7951 Long term (current) use of inhaled steroids: Secondary | ICD-10-CM

## 2023-10-22 DIAGNOSIS — I5033 Acute on chronic diastolic (congestive) heart failure: Secondary | ICD-10-CM

## 2023-10-22 DIAGNOSIS — I130 Hypertensive heart and chronic kidney disease with heart failure and stage 1 through stage 4 chronic kidney disease, or unspecified chronic kidney disease: Secondary | ICD-10-CM | POA: Diagnosis not present

## 2023-10-22 DIAGNOSIS — R062 Wheezing: Secondary | ICD-10-CM

## 2023-10-22 DIAGNOSIS — L03115 Cellulitis of right lower limb: Secondary | ICD-10-CM | POA: Diagnosis not present

## 2023-10-22 DIAGNOSIS — Z7901 Long term (current) use of anticoagulants: Secondary | ICD-10-CM

## 2023-10-22 DIAGNOSIS — I482 Chronic atrial fibrillation, unspecified: Secondary | ICD-10-CM | POA: Diagnosis present

## 2023-10-22 DIAGNOSIS — I5032 Chronic diastolic (congestive) heart failure: Secondary | ICD-10-CM | POA: Diagnosis not present

## 2023-10-22 DIAGNOSIS — Z9049 Acquired absence of other specified parts of digestive tract: Secondary | ICD-10-CM | POA: Diagnosis not present

## 2023-10-22 DIAGNOSIS — D631 Anemia in chronic kidney disease: Secondary | ICD-10-CM | POA: Diagnosis present

## 2023-10-22 DIAGNOSIS — I700 Atherosclerosis of aorta: Secondary | ICD-10-CM | POA: Diagnosis not present

## 2023-10-22 DIAGNOSIS — N1831 Chronic kidney disease, stage 3a: Secondary | ICD-10-CM | POA: Diagnosis present

## 2023-10-22 DIAGNOSIS — J449 Chronic obstructive pulmonary disease, unspecified: Secondary | ICD-10-CM | POA: Diagnosis not present

## 2023-10-22 DIAGNOSIS — N179 Acute kidney failure, unspecified: Secondary | ICD-10-CM | POA: Diagnosis not present

## 2023-10-22 DIAGNOSIS — M79604 Pain in right leg: Secondary | ICD-10-CM | POA: Diagnosis not present

## 2023-10-22 DIAGNOSIS — R6520 Severe sepsis without septic shock: Secondary | ICD-10-CM | POA: Diagnosis not present

## 2023-10-22 DIAGNOSIS — N1832 Chronic kidney disease, stage 3b: Secondary | ICD-10-CM | POA: Diagnosis present

## 2023-10-22 DIAGNOSIS — Z87891 Personal history of nicotine dependence: Secondary | ICD-10-CM | POA: Diagnosis not present

## 2023-10-22 DIAGNOSIS — I480 Paroxysmal atrial fibrillation: Secondary | ICD-10-CM

## 2023-10-22 DIAGNOSIS — A419 Sepsis, unspecified organism: Secondary | ICD-10-CM | POA: Diagnosis not present

## 2023-10-22 DIAGNOSIS — T361X5A Adverse effect of cephalosporins and other beta-lactam antibiotics, initial encounter: Secondary | ICD-10-CM | POA: Diagnosis present

## 2023-10-22 DIAGNOSIS — R531 Weakness: Secondary | ICD-10-CM | POA: Diagnosis not present

## 2023-10-22 DIAGNOSIS — E6609 Other obesity due to excess calories: Secondary | ICD-10-CM | POA: Diagnosis not present

## 2023-10-22 DIAGNOSIS — M170 Bilateral primary osteoarthritis of knee: Secondary | ICD-10-CM | POA: Diagnosis not present

## 2023-10-22 LAB — COMPREHENSIVE METABOLIC PANEL WITH GFR
ALT: 11 U/L (ref 0–44)
AST: 16 U/L (ref 15–41)
Albumin: 3.4 g/dL — ABNORMAL LOW (ref 3.5–5.0)
Alkaline Phosphatase: 88 U/L (ref 38–126)
Anion gap: 11 (ref 5–15)
BUN: 64 mg/dL — ABNORMAL HIGH (ref 8–23)
CO2: 21 mmol/L — ABNORMAL LOW (ref 22–32)
Calcium: 8.8 mg/dL — ABNORMAL LOW (ref 8.9–10.3)
Chloride: 110 mmol/L (ref 98–111)
Creatinine, Ser: 2.09 mg/dL — ABNORMAL HIGH (ref 0.44–1.00)
GFR, Estimated: 24 mL/min — ABNORMAL LOW (ref >60)
Glucose, Bld: 113 mg/dL — ABNORMAL HIGH (ref 70–99)
Potassium: 4.6 mmol/L (ref 3.5–5.1)
Sodium: 142 mmol/L (ref 135–145)
Total Bilirubin: 0.5 mg/dL (ref 0.0–1.2)
Total Protein: 6.6 g/dL (ref 6.5–8.1)

## 2023-10-22 LAB — LACTIC ACID, PLASMA: Lactic Acid, Venous: 1.5 mmol/L (ref 0.5–1.9)

## 2023-10-22 LAB — CBC WITH DIFFERENTIAL/PLATELET
Abs Immature Granulocytes: 0.13 10*3/uL — ABNORMAL HIGH (ref 0.00–0.07)
Basophils Absolute: 0.1 10*3/uL (ref 0.0–0.1)
Basophils Relative: 0 %
Eosinophils Absolute: 1.8 10*3/uL — ABNORMAL HIGH (ref 0.0–0.5)
Eosinophils Relative: 9 %
HCT: 40.7 % (ref 36.0–46.0)
Hemoglobin: 12.7 g/dL (ref 12.0–15.0)
Immature Granulocytes: 1 %
Lymphocytes Relative: 23 %
Lymphs Abs: 4.7 10*3/uL — ABNORMAL HIGH (ref 0.7–4.0)
MCH: 32.8 pg (ref 26.0–34.0)
MCHC: 31.2 g/dL (ref 30.0–36.0)
MCV: 105.2 fL — ABNORMAL HIGH (ref 80.0–100.0)
Monocytes Absolute: 1.6 10*3/uL — ABNORMAL HIGH (ref 0.1–1.0)
Monocytes Relative: 8 %
Neutro Abs: 12.2 10*3/uL — ABNORMAL HIGH (ref 1.7–7.7)
Neutrophils Relative %: 59 %
Platelets: 250 10*3/uL (ref 150–400)
RBC: 3.87 MIL/uL (ref 3.87–5.11)
RDW: 13.2 % (ref 11.5–15.5)
WBC: 20.5 10*3/uL — ABNORMAL HIGH (ref 4.0–10.5)
nRBC: 0 % (ref 0.0–0.2)

## 2023-10-22 LAB — BRAIN NATRIURETIC PEPTIDE: B Natriuretic Peptide: 58 pg/mL (ref 0.0–100.0)

## 2023-10-22 MED ORDER — LACTATED RINGERS IV SOLN: INTRAVENOUS | Status: DC

## 2023-10-22 MED ORDER — TRAZODONE HCL 50 MG PO TABS
50.0000 mg | ORAL_TABLET | Freq: Every day | ORAL | Status: DC
  Administered 2023-10-22 – 2023-10-27 (×6): 50 mg via ORAL
  Filled 2023-10-22 (×6): qty 1

## 2023-10-22 MED ORDER — LACTATED RINGERS IV BOLUS (SEPSIS)
1000.0000 mL | Freq: Once | INTRAVENOUS | Status: AC
  Administered 2023-10-22: 1000 mL via INTRAVENOUS

## 2023-10-22 MED ORDER — POLYETHYLENE GLYCOL 3350 17 G PO PACK: 17.0000 g | PACK | Freq: Every day | ORAL | Status: DC | PRN

## 2023-10-22 MED ORDER — ALBUTEROL SULFATE (2.5 MG/3ML) 0.083% IN NEBU: 2.5000 mg | INHALATION_SOLUTION | Freq: Four times a day (QID) | RESPIRATORY_TRACT | Status: DC | PRN

## 2023-10-22 MED ORDER — LACTATED RINGERS IV BOLUS (SEPSIS)
500.0000 mL | Freq: Once | INTRAVENOUS | Status: AC
  Administered 2023-10-22: 500 mL via INTRAVENOUS

## 2023-10-22 MED ORDER — ACETAMINOPHEN 650 MG RE SUPP: 650.0000 mg | Freq: Four times a day (QID) | RECTAL | Status: DC | PRN

## 2023-10-22 MED ORDER — ONDANSETRON HCL 4 MG/2ML IJ SOLN: 4.0000 mg | Freq: Four times a day (QID) | INTRAMUSCULAR | Status: DC | PRN

## 2023-10-22 MED ORDER — PANTOPRAZOLE SODIUM 40 MG PO TBEC
40.0000 mg | DELAYED_RELEASE_TABLET | Freq: Every day | ORAL | Status: DC
  Administered 2023-10-23 – 2023-10-28 (×6): 40 mg via ORAL
  Filled 2023-10-22 (×6): qty 1

## 2023-10-22 MED ORDER — APIXABAN 5 MG PO TABS
5.0000 mg | ORAL_TABLET | Freq: Two times a day (BID) | ORAL | Status: DC
  Administered 2023-10-22 – 2023-10-28 (×12): 5 mg via ORAL
  Filled 2023-10-22 (×12): qty 1

## 2023-10-22 MED ORDER — SODIUM CHLORIDE 0.9 % IV SOLN
1.0000 g | Freq: Three times a day (TID) | INTRAVENOUS | Status: DC
  Administered 2023-10-23: 1 g via INTRAVENOUS
  Filled 2023-10-22 (×5): qty 5

## 2023-10-22 MED ORDER — HYDROCODONE-ACETAMINOPHEN 5-325 MG PO TABS
1.0000 | ORAL_TABLET | Freq: Four times a day (QID) | ORAL | Status: DC | PRN
  Administered 2023-10-22 – 2023-10-28 (×18): 1 via ORAL
  Filled 2023-10-22 (×18): qty 1

## 2023-10-22 MED ORDER — ONDANSETRON HCL 4 MG PO TABS
4.0000 mg | ORAL_TABLET | Freq: Four times a day (QID) | ORAL | Status: AC | PRN
Start: 2023-10-22 — End: ?

## 2023-10-22 MED ORDER — ESCITALOPRAM OXALATE 10 MG PO TABS
20.0000 mg | ORAL_TABLET | Freq: Every day | ORAL | Status: DC
  Administered 2023-10-23 – 2023-10-28 (×6): 20 mg via ORAL
  Filled 2023-10-22 (×6): qty 2

## 2023-10-22 MED ORDER — VANCOMYCIN HCL 1750 MG/350ML IV SOLN
1750.0000 mg | Freq: Once | INTRAVENOUS | Status: AC
  Administered 2023-10-22: 1750 mg via INTRAVENOUS
  Filled 2023-10-22: qty 350

## 2023-10-22 MED ORDER — SODIUM CHLORIDE 0.9 % IV SOLN
2.0000 g | Freq: Once | INTRAVENOUS | Status: AC
  Administered 2023-10-22: 2 g via INTRAVENOUS
  Filled 2023-10-22: qty 20

## 2023-10-22 MED ORDER — FLUTICASONE FUROATE-VILANTEROL 200-25 MCG/ACT IN AEPB
1.0000 | INHALATION_SPRAY | Freq: Every day | RESPIRATORY_TRACT | Status: DC
  Administered 2023-10-23 – 2023-10-28 (×6): 1 via RESPIRATORY_TRACT
  Filled 2023-10-22: qty 28

## 2023-10-22 MED ORDER — VANCOMYCIN HCL IN DEXTROSE 1-5 GM/200ML-% IV SOLN
1000.0000 mg | INTRAVENOUS | Status: DC
  Administered 2023-10-24 – 2023-10-28 (×3): 1000 mg via INTRAVENOUS
  Filled 2023-10-22 (×3): qty 200

## 2023-10-22 MED ORDER — METOPROLOL TARTRATE 25 MG PO TABS
25.0000 mg | ORAL_TABLET | Freq: Two times a day (BID) | ORAL | Status: DC
  Administered 2023-10-23 – 2023-10-24 (×2): 25 mg via ORAL
  Filled 2023-10-22 (×3): qty 1

## 2023-10-22 MED ORDER — ACETAMINOPHEN 325 MG PO TABS: 650.0000 mg | ORAL_TABLET | Freq: Four times a day (QID) | ORAL | Status: DC | PRN

## 2023-10-22 NOTE — Sepsis Progress Note (Signed)
 eLink is following this Code Sepsis.

## 2023-10-22 NOTE — Assessment & Plan Note (Signed)
 Stable and compensated.  Last echo 01/2023 EF 60 to 65%, G1DD.  She reports compliance with Lasix . - 2.5 L bolus given, resume home Lasix  20 mg daily pending stable blood pressure.

## 2023-10-22 NOTE — Assessment & Plan Note (Addendum)
 Creatinine 2.09, baseline 1.3-1.7.  Baseline CKD stage IIIb -Hydrate -Hold lisinopril 

## 2023-10-22 NOTE — Assessment & Plan Note (Signed)
 Rate controlled and on anticoagulation. -Resume metoprolol , diltiazem  and Eliquis 

## 2023-10-22 NOTE — Assessment & Plan Note (Addendum)
 Meeting severe sepsis criteria with leukocytosis of 20.5, and tachypnea respiratory rate 18-29.  Evidence of endorgan dysfunction AKI on CKD 3b.  Normal lactic acid 1.5. -Follow-up blood cultures - 2.5 L bolus given, hold further fluids. - Reports some dysuria, f/u UA

## 2023-10-22 NOTE — Progress Notes (Signed)
 Pharmacy Antibiotic Note  Denise Underwood is a 78 y.o. female admitted on 10/22/2023 with cellulitis.  Pharmacy has been consulted for vancomycin  dosing.  Plan: Vancomycin  1750 mg IV x 1 dose. Vancomycin  1000 mg IV every 48 hours. Monitor labs, c/s, and vanco levels as indicated.  Height: 5' 4 (162.6 cm) Weight: 79.8 kg (176 lb) IBW/kg (Calculated) : 54.7  Temp (24hrs), Avg:97.5 F (36.4 C), Min:97.5 F (36.4 C), Max:97.5 F (36.4 C)  Recent Labs  Lab 10/22/23 1358  WBC 20.5*  CREATININE 2.09*  LATICACIDVEN 1.5    Estimated Creatinine Clearance: 22.7 mL/min (A) (by C-G formula based on SCr of 2.09 mg/dL (H)).    No Known Allergies  Antimicrobials this admission: Vanco 6/11 >> CTX 6/11  Microbiology results: 6/11 BCx: pending   Thank you for allowing pharmacy to be a part of this patient's care.  Cliffton Dama, PharmD Clinical Pharmacist 10/22/2023 2:36 PM

## 2023-10-22 NOTE — Assessment & Plan Note (Addendum)
 Failed outpatient therapy with Keflex , took Doxy for 2 days.  Significant swelling and erythema to right lower extremity.   She is compliant with Eliquis  and reports outpatient ultrasound negative for DVT.  -Vancomycin  started, continue - ?? etiology of rash. Dose of rocephin  ws given in the ED. In the interim will use aztreonam for gram-negative coverage - HgbA1c -Patient will bring in report of recent out-patient ultrasound - Able to move her knee but to a limited extent, doubt septic arthritis. Will consult Ortho to help rule out.

## 2023-10-22 NOTE — ED Provider Notes (Signed)
 [place] EMERGENCY DEPARTMENT AT [facility] Provider Note   CSN: [id number] Arrival date & time: 10/22/23  1318     History  Chief Complaint  Patient presents with   Leg Swelling    Denise Underwood is a 78 y.o. female.  HPI      Denise Underwood is a 78 y.o. female with past medical history of hypertension, hyperlipidemia, prediabetes, COPD, CKD and CHF, and atrial fibrillation, anticoagulated who presents to the Emergency Department complaining of swelling pain and redness of right lower extremity.  She notes rash to most of her trunk and lower extremities as well.  Her symptoms began 1 week ago with a area of redness to her right lower extremity.  She was seen by her PCP and started on cephalexin .  Symptoms were not improving and she was seen by another provider earlier in the week.  She states she had a ultrasound of her leg that was negative for evidence of blood clot.  Her symptoms have not improved.  She describes generalized itching with burning pain to her right lower extremity.  She has had a blister to the leg that is also drained clear fluid.  She has had progressive swelling to her right lower leg as well.  She denies any chest pain, fever, nausea vomiting, shortness of breath.   Home Medications Prior to Admission medications   Medication Sig Start Date End Date Taking? Authorizing Provider  acetaminophen  (TYLENOL ) 500 MG tablet Take 500 mg by mouth every 6 (six) hours as needed for mild pain (pain score 1-3).   Yes [provider]  acetaminophen  (TYLENOL ) 650 MG CR tablet Take 650 mg by mouth every 8 (eight) hours as needed for pain.   Yes [provider]  albuterol  (VENTOLIN  HFA) 108 (90 Base) MCG/ACT inhaler inhale 2 puffs into the lungs every 6 hours as needed. Patient taking differently: Inhale 2 puffs into the lungs every 6 (six) hours as needed for wheezing or shortness of breath. 10/08/23  Yes [name], [name], FNP  apixaban  (ELIQUIS ) 5 MG  TABS tablet Take 1 tablet (5 mg total) by mouth 2 (two) times daily. 06/16/23  Yes Wendie Hamburg, MD  CALCIUM  PO Take 1 tablet by mouth daily.   Yes [provider]  cephALEXin  (KEFLEX ) 500 MG capsule Take 500 mg by mouth 3 (three) times daily. 10/19/23  Yes [provider]  Cholecalciferol (VITAMIN D3) 1000 units CAPS Take 2,000 Units by mouth.   Yes [provider]  diltiazem  (CARTIA  XT) 180 MG 24 hr capsule take 1 capsule (180 milligram total) by mouth daily. Patient taking differently: Take 180 mg by mouth daily. 10/08/23  Yes [name], [name], FNP  doxycycline  (VIBRA -TABS) 100 MG tablet Take 1 tablet (100 mg total) by mouth 2 (two) times daily for 10 days. 1 po bid 10/14/23 10/24/23 Yes [name], [name], FNP  Emollient (CERAVE DIABETICS DRY SKIN) CREA Apply 1 Application topically 2 (two) times daily. 06/19/22  Yes [name], [name], FNP  escitalopram  (LEXAPRO ) 20 MG tablet Take 1 tablet (20 mg total) by mouth daily. 10/14/23  Yes [name], [name], FNP  fluconazole  (DIFLUCAN ) 200 MG tablet Take 1 tablet (200 mg total) by mouth once a week for 4 doses. 10/14/23 11/05/23 Yes [name], [name], FNP  fluticasone -salmeterol (ADVAIR) 250-50 MCG/ACT AEPB inhale 1 puff into the lungs in the morning and at bedtime. 10/08/23  Yes RakesGeorgeann Kindred, FNP  furosemide  (LASIX ) 20 MG tablet  Take 1 tablet (20 mg total) by mouth daily. 10/14/23  Yes [name], [name], FNP  HYDROcodone-acetaminophen  (NORCO/VICODIN) 5-325 MG tablet Take 1 tablet by mouth every 6 (six) hours as needed for moderate pain (pain score 4-6).   Yes [provider]  lisinopril  (ZESTRIL ) 5 MG tablet Take 1 tablet (5 mg total) by mouth daily. 09/04/23  Yes [name], [name], FNP  metoprolol  tartrate (LOPRESSOR ) 25 MG tablet Take 1 tablet (25 mg total) by mouth 2 (two) times daily. 10/14/23  Yes [name], [name], FNP  nystatin  (MYCOSTATIN /NYSTOP ) powder Apply 1 Application topically 3 (three) times daily. 10/14/23  Yes [name], [name], FNP   pantoprazole  (PROTONIX ) 40 MG tablet take 1 tablet (40 milligram total) by mouth daily. Patient taking differently: Take 40 mg by mouth daily. 10/08/23  Yes [name], [name], FNP  traZODone  (DESYREL ) 50 MG tablet Take 1 tablet (50 mg total) by mouth at bedtime. 10/14/23  Yes [name], [name], FNP  triamcinolone  cream (KENALOG ) 0.1 % Apply 1 Application topically 2 (two) times daily. 10/14/23  Yes [name], [name], FNP      Allergies    Patient has no known allergies.    Review of Systems   Review of Systems  Constitutional:  Negative for appetite change, chills and fever.  Respiratory:  Negative for cough and shortness of breath.   Cardiovascular:  Negative for chest pain.  Gastrointestinal:  Negative for abdominal pain, diarrhea, nausea and vomiting.  Genitourinary:  Negative for dysuria.  Musculoskeletal:  Positive for myalgias.  Skin:  Positive for color change and rash.  Neurological:  Negative for dizziness, weakness and numbness.    Physical Exam Updated Vital Signs BP 115/64   Pulse 79   Temp 97.7 F (36.5 C) (Oral)   Resp (!) 29   Ht 5' 4 (1.626 m)   Wt 79.8 kg   SpO2 91%   BMI 30.21 kg/m  Physical Exam Vitals and nursing note reviewed.  Constitutional:      General: She is not in acute distress.    Appearance: Normal appearance.  HENT:     Mouth/Throat:     Mouth: Mucous membranes are moist.  Cardiovascular:     Rate and Rhythm: Normal rate and regular rhythm.     Pulses: Normal pulses.  Pulmonary:     Effort: Pulmonary effort is normal.     Breath sounds: Normal breath sounds.  Chest:     Chest wall: No tenderness.  Abdominal:     Palpations: Abdomen is soft.     Tenderness: There is no abdominal tenderness.  Musculoskeletal:        General: Swelling and tenderness present. No signs of injury.     Right lower leg: Edema present.     Comments: Confluent erythema of the right lower extremity.  Extremity is also very edematous, lower area of serous weeping.   Compartments of the lower extremities are soft.  See Attached photos    Skin:    Capillary Refill: Capillary refill takes less than 2 seconds. Palpable dorsalis pedis pulses bilaterally.    Findings: Erythema and rash present.     Comments: Patchy macular erythematous rash to trunk and bilateral lower extremities.  Neurological:     General: No focal deficit present.     Mental Status: She is alert.     Sensory: No sensory deficit.     Motor: No weakness.  ED Results / Procedures / Treatments   Labs (all labs ordered are listed, but only abnormal results are displayed) Labs Reviewed  CBC WITH DIFFERENTIAL/PLATELET - Abnormal; Notable for the following components:      Result Value   WBC 20.5 (*)    MCV 105.2 (*)    Neutro Abs 12.2 (*)    Lymphs Abs 4.7 (*)    Monocytes Absolute 1.6 (*)    Eosinophils Absolute 1.8 (*)    Abs Immature Granulocytes 0.13 (*)    All other components within normal limits  COMPREHENSIVE METABOLIC PANEL WITH GFR - Abnormal; Notable for the following components:   CO2 21 (*)    Glucose, Bld 113 (*)    BUN 64 (*)    Creatinine, Ser 2.09 (*)    Calcium  8.8 (*)    Albumin 3.4 (*)    GFR, Estimated 24 (*)    All other components within normal limits  CULTURE, BLOOD (ROUTINE X 2)  CULTURE, BLOOD (ROUTINE X 2)  LACTIC ACID, PLASMA  BRAIN NATRIURETIC PEPTIDE  URINALYSIS, W/ REFLEX TO CULTURE (INFECTION SUSPECTED)    EKG EKG Interpretation Date/Time:  Wednesday October 22 2023 15:16:29 EDT Ventricular Rate:  64 PR Interval:  193 QRS Duration:  91 QT Interval:  465 QTC Calculation: 480 R Axis:   40  Text Interpretation: Sinus rhythm Confirmed by Annita Kindle [phone]) on 10/22/2023 5:00:55 PM  Radiology DG Chest Portable 1 View Result Date: 10/22/2023 CLINICAL DATA:  weakness, sepsis EXAM: PORTABLE CHEST - 1 VIEW COMPARISON:  March 03, 2023 FINDINGS: No focal airspace consolidation, pleural effusion, or pneumothorax.  No cardiomegaly. Aortic atherosclerosis. No acute fracture or destructive lesion. Multilevel thoracic osteophytosis. IMPRESSION: No acute cardiopulmonary abnormality. Electronically Signed   By: Rance Burrows M.D.   On: 10/22/2023 15:48    Procedures Procedures    Medications Ordered in ED Medications  lactated ringers  infusion ( Intravenous Infusion Verify 10/22/23 1839)  vancomycin  (VANCOCIN ) IVPB 1000 mg/200 mL premix (has no administration in time range)  cefTRIAXone  (ROCEPHIN ) 2 g in sodium chloride  0.9 % 100 mL IVPB (0 g Intravenous Stopped 10/22/23 1512)  lactated ringers  bolus 1,000 mL (0 mLs Intravenous Stopped 10/22/23 1558)    And  lactated ringers  bolus 1,000 mL (0 mLs Intravenous Stopped 10/22/23 1729)    And  lactated ringers  bolus 500 mL (0 mLs Intravenous Stopped 10/22/23 1806)  vancomycin  (VANCOREADY) IVPB 1750 mg/350 mL (0 mg Intravenous Stopped 10/22/23 1721)    ED Course/ Medical Decision Making/ A&P Clinical Course as of 10/22/23 1912  Wed Oct 22, 2023  1435 78 yo female here with right leg pain and swelling x 5 days.  PCP had her on keflex  for cellulitis the past 3 days, no improvement.  Had neg DVT ultrasound today.  No fevers or chills  No hx of diabetes.  ON exam has edematous, weeping right lower extremity with warmth and redness consistent with cellulitis.  WBC 20.5.  Lactate wnl.  BUN and Cr elevated borderline AKI.  Plan for cellulitis admission.  Originally ordered rocephin  but I would add vancomycin  to switch out of the cephalosporin family and ensure MRSA coverage - consider narrowing to doxycycline  inpatient.   I have a low suspicion for nec fasc at this time or acute DVT with her purported normal scan today [facility]) according to her daughters at bedside [MT]    Clinical Course User Index [MT] Trifan, Janalyn Me, MD  Medical Decision Making Patient here from PCPs office for further evaluation of redness swelling  and pain itching of her right lower extremity.  She describes having itching over most of her entire body.  She developed redness of her right lower leg 1 week ago.  Was prescribed antibiotic, unsure of name but presumed to be cephalexin .  She is continue to have redness itching and swelling of her lower extremity.  Seen by PCP earlier today had venous imaging of the lower extremity and patient reports negative for DVT I am unable to find results of imaging in epic.  Patient is also anticoagulated on Eliquis .  After further history obtained from patient's daughter, she has also been taking doxycycline  for 3 days.  This was not revealed on initial history.  Amount and/or Complexity of Data Reviewed Labs: ordered.    Details: Labs leukocytosis with white count of greater than 20,000, hemoglobin unremarkable, chemistries show mild AKI with serum creatinine of 2.09 this is elevated from 1.38 eight days ago.  Lactic acid unremarkable Radiology: ordered.    Details:  Patient and family endorses venous ultrasound imaging of the right lower extremity 3 days ago at another facility.  This result is not available in care everywhere   Chest x-ray today without evidence of pleural effusion ECG/medicine tests: ordered.    Details: EKG shows sinus rhythm Discussion of management or test interpretation with external provider(s): Pt also seen by Dr. Gordon Latus and care plan discussed   On arrival, patient had normal blood pressure and afebrile, some hypotension developed, screening labs show significant leukocytosis.  Code sepsis was activated.  Patient initially received IV Rocephin  under sepsis protocol for cellulitis, but was changed to vancomycin  after consideration of possible drug-related allergy.   She is anticoagulated due to history of A-fib, her family at bedside states that she had venous imaging at another facility few days ago.  States was negative for DVT.  Extremities are soft, I do not appreciate  any soft tissue crepitance on exam.  Patient is well-appearing.  Initial hypotension but improved after IV fluids.  She is afebrile.  She will need hospital admission for cellulitis of the lower extremity.    Consulted with [facility] hopsitalist who agrees to admit.    Risk Prescription drug management. Decision regarding hospitalization.           Final Clinical Impression(s) / ED Diagnoses Final diagnoses:  Cellulitis of right lower extremity    Rx / DC Orders ED Discharge Orders     None         Catherne Clubs, PA-C 10/22/23 1916    Arvilla Birmingham, MD 10/23/23 1428

## 2023-10-22 NOTE — Assessment & Plan Note (Addendum)
 Blood pressure systolic ranging from 90-135. -Resume metoprolol  for continued rate control of atrial fibrillation -Hold Cardizem , lisinopril  Lasix , resume when able

## 2023-10-22 NOTE — ED Triage Notes (Signed)
 Pt arrived via POV c/o bilateral lower extremity swelling, right leg worse than the left. Pt reports her leg looks angry. Pt presents with redness and swelling and an open blister on RLE.

## 2023-10-22 NOTE — H&P (Addendum)
 History and Physical    Denise Underwood:[id number] DOB: Oct 29, 1945 DOA: 10/22/2023  PCP: Galvin Jules, FNP   Patient coming from: Home  I have personally briefly reviewed patient's old medical records in [facility]  Chief Complaint: Right lower extremity cellulitis and pain   HPI: Denise Underwood is a 78 y.o. female with medical history significant for diastolic CHF, atrial fibrillation, COPD, hypertension, CKD 3, psoriasis, chronic respiratory failure on as needed O2.  Patient presented to the ED with complaints of swelling to the right lower extremity that started about 2 weeks ago.  She was started on a course of Keflex  which she took for about a week, as symptoms were not improving and she went back to her outpatient provider and was started on a course of doxycycline  6/9, which she took for 2 days.  She also had an ultrasound done which was negative for DVT.  She has been compliant with her Eliquis . She reports redness started with discoloration to the posterior leg and spread upwards to her thigh.  She started having similar patchy redness to her posterior left leg, that started several days after onset on the right.  This is now spreading upwards towards her left groin.  She does not know if this is related to the antibiotics.  ED Course: Pertinent 7.5.  Heart rate 56-75, respirate rate 18-29, blood pressure systolic 90-135.  O2 sats greater than 92% on room air. Leukocytosis of 20.5.  Elevated creatinine 2.09. Lactic acid 1.5. Chest x-ray clear. IV ceftriaxone  was given in the ED, and started on vancomycin .  Concern if the rash patient has- may have been2/2 Keflex .  She was on Keflex  when the rash started. 2.5 L bolus given.  Review of Systems: As per HPI all other systems reviewed and negative.  Past Medical History:  Diagnosis Date   Anxiety    Arthritis    Hyperlipidemia    Hypertension    Osteopenia 07/04/2021   Prediabetes 07/09/2021   Psoriasis     Past  Surgical History:  Procedure Laterality Date   ABDOMINAL HYSTERECTOMY     CHOLECYSTECTOMY     HERNIA REPAIR     KNEE SURGERY     TUBAL LIGATION       reports that she has quit smoking. Her smoking use included cigarettes. She has a 20 pack-year smoking history. She has never used smokeless tobacco. She reports that she does not drink alcohol  and does not use drugs.  No Known Allergies  Family history of hypertension.  Prior to Admission medications   Medication Sig Start Date End Date Taking? Authorizing Provider  acetaminophen  (TYLENOL ) 500 MG tablet Take 500 mg by mouth every 6 (six) hours as needed for mild pain (pain score 1-3).   Yes [provider]  acetaminophen  (TYLENOL ) 650 MG CR tablet Take 650 mg by mouth every 8 (eight) hours as needed for pain.   Yes [provider]  albuterol  (VENTOLIN  HFA) 108 (90 Base) MCG/ACT inhaler inhale 2 puffs into the lungs every 6 hours as needed. Patient taking differently: Inhale 2 puffs into the lungs every 6 (six) hours as needed for wheezing or shortness of breath. 10/08/23  Yes [name], [name], FNP  apixaban  (ELIQUIS ) 5 MG TABS tablet Take 1 tablet (5 mg total) by mouth 2 (two) times daily. 06/16/23  Yes Wendie Hamburg, MD  CALCIUM  PO Take 1 tablet by mouth daily.   Yes [provider]  cephALEXin  (KEFLEX ) 500  MG capsule Take 500 mg by mouth 3 (three) times daily. 10/19/23  Yes [provider]  Cholecalciferol (VITAMIN D3) 1000 units CAPS Take 2,000 Units by mouth.   Yes [provider]  diltiazem  (CARTIA  XT) 180 MG 24 hr capsule take 1 capsule (180 milligram total) by mouth daily. Patient taking differently: Take 180 mg by mouth daily. 10/08/23  Yes [name], [name], FNP  doxycycline  (VIBRA -TABS) 100 MG tablet Take 1 tablet (100 mg total) by mouth 2 (two) times daily for 10 days. 1 po bid 10/14/23 10/24/23 Yes [name], [name], FNP  Emollient (CERAVE DIABETICS DRY SKIN) CREA Apply 1 Application  topically 2 (two) times daily. 06/19/22  Yes [name], [name], FNP  escitalopram  (LEXAPRO ) 20 MG tablet Take 1 tablet (20 mg total) by mouth daily. 10/14/23  Yes [name], [name], FNP  fluconazole  (DIFLUCAN ) 200 MG tablet Take 1 tablet (200 mg total) by mouth once a week for 4 doses. 10/14/23 11/05/23 Yes [name], [name], FNP  fluticasone -salmeterol (ADVAIR) 250-50 MCG/ACT AEPB inhale 1 puff into the lungs in the morning and at bedtime. 10/08/23  Yes [name], [name], FNP  furosemide  (LASIX ) 20 MG tablet Take 1 tablet (20 mg total) by mouth daily. 10/14/23  Yes [name], [name], FNP  HYDROcodone-acetaminophen  (NORCO/VICODIN) 5-325 MG tablet Take 1 tablet by mouth every 6 (six) hours as needed for moderate pain (pain score 4-6).   Yes [provider]  lisinopril  (ZESTRIL ) 5 MG tablet Take 1 tablet (5 mg total) by mouth daily. 09/04/23  Yes [name], [name], FNP  metoprolol  tartrate (LOPRESSOR ) 25 MG tablet Take 1 tablet (25 mg total) by mouth 2 (two) times daily. 10/14/23  Yes [name], [name], FNP  nystatin  (MYCOSTATIN /NYSTOP ) powder Apply 1 Application topically 3 (three) times daily. 10/14/23  Yes [name], [name], FNP  pantoprazole  (PROTONIX ) 40 MG tablet take 1 tablet (40 milligram total) by mouth daily. Patient taking differently: Take 40 mg by mouth daily. 10/08/23  Yes [name], [name], FNP  traZODone  (DESYREL ) 50 MG tablet Take 1 tablet (50 mg total) by mouth at bedtime. 10/14/23  Yes [name], [name], FNP  triamcinolone  cream (KENALOG ) 0.1 % Apply 1 Application topically 2 (two) times daily. 10/14/23  Yes Galvin Jules, FNP    Physical Exam: Vitals:   10/22/23 1824 10/22/23 1830 10/22/23 1905 10/22/23 1909  BP:    115/64  Pulse: 73 74 75 79  Resp: 20 (!) 21 (!) 21 (!) 29  Temp:      TempSrc:      SpO2: 93% 95% 93% 91%  Weight:      Height:        Constitutional: NAD, calm, comfortable Vitals:   10/22/23 1824 10/22/23 1830 10/22/23 1905 10/22/23 1909  BP:    115/64  Pulse: 73 74 75 79  Resp: 20 (!) 21  (!) 21 (!) 29  Temp:      TempSrc:      SpO2: 93% 95% 93% 91%  Weight:      Height:       Eyes: PERRL, lids and conjunctivae normal ENMT: Mucous membranes are moist.  Neck: normal, supple, no masses, no thyromegaly Respiratory: clear to auscultation bilaterally, no wheezing, no crackles. Normal respiratory effort. No accessory muscle use.  Cardiovascular: Regular rate and rhythm, no murmurs / rubs / gallops. No extremity edema.  Swelling to right lower extremity significant, no swelling to left lower extremity.   Abdomen: no tenderness, no masses palpated.  No hepatosplenomegaly. Bowel sounds positive.  Musculoskeletal: Limited motion of right knee 2/2 pain swelling of entire right leg, no clubbing / cyanosis.  Skin: Significant erythema, tenderness and swelling to right lower extremity extending from feet past the knees to thighs, opened bullae to anterior leg.  Superimposed erythema rash, confluent macules appearing like patches,, involving left lower extremity. Neurologic: No facial asymmetry, when extremities spontaneously Psychiatric: Normal judgment and insight. Alert and oriented x 3. Normal mood.           Labs on Admission: I have personally reviewed following labs and imaging studies  CBC: Recent Labs  Lab 10/22/23 1358  WBC 20.5*  NEUTROABS 12.2*  HGB 12.7  HCT 40.7  MCV 105.2*  PLT 250   Basic Metabolic Panel: Recent Labs  Lab 10/22/23 1358  NA 142  K 4.6  CL 110  CO2 21*  GLUCOSE 113*  BUN 64*  CREATININE 2.09*  CALCIUM  8.8*   GFR: Estimated Creatinine Clearance: 22.7 mL/min (A) (by C-G formula based on SCr of 2.09 mg/dL (H)). Liver Function Tests: Recent Labs  Lab 10/22/23 1358  AST 16  ALT 11  ALKPHOS 88  BILITOT 0.5  PROT 6.6  ALBUMIN 3.4*   Urine analysis:    Component Value Date/Time   COLORURINE YELLOW 02/20/2023 1259   APPEARANCEUR CLOUDY (A) 02/20/2023 1259   APPEARANCEUR Clear 05/30/2017 1019   LABSPEC 1.019 02/20/2023 1259    PHURINE 5.0 02/20/2023 1259   GLUCOSEU NEGATIVE 02/20/2023 1259   HGBUR SMALL (A) 02/20/2023 1259   BILIRUBINUR NEGATIVE 02/20/2023 1259   BILIRUBINUR Negative 05/30/2017 1019   KETONESUR NEGATIVE 02/20/2023 1259   PROTEINUR NEGATIVE 02/20/2023 1259   NITRITE NEGATIVE 02/20/2023 1259   LEUKOCYTESUR MODERATE (A) 02/20/2023 1259    Radiological Exams on Admission: DG Chest Portable 1 View Result Date: 10/22/2023 CLINICAL DATA:  weakness, sepsis EXAM: PORTABLE CHEST - 1 VIEW COMPARISON:  March 03, 2023 FINDINGS: No focal airspace consolidation, pleural effusion, or pneumothorax. No cardiomegaly. Aortic atherosclerosis. No acute fracture or destructive lesion. Multilevel thoracic osteophytosis. IMPRESSION: No acute cardiopulmonary abnormality. Electronically Signed   By: Rance Burrows M.D.   On: 10/22/2023 15:48   EKG: Independently reviewed.  Sinus rhythm, rate 64, QTc 480.  Assessment/Plan Principal Problem:   Cellulitis of right lower extremity Active Problems:   Severe sepsis (HCC)   Primary hypertension   Acute kidney injury superimposed on chronic kidney disease (HCC)   Atrial fibrillation (HCC)   Chronic diastolic CHF (congestive heart failure) (HCC)   Stage 3a chronic kidney disease (HCC)  Assessment and Plan: * Cellulitis of right lower extremity Failed outpatient therapy with Keflex , took Doxy for 2 days.  Significant swelling and erythema to right lower extremity.   She is compliant with Eliquis  and reports outpatient ultrasound negative for DVT.  -Vancomycin  started, continue - ?? etiology of rash. Dose of rocephin  ws given in the ED. In the interim will use aztreonam for gram-negative coverage - HgbA1c -Patient will bring in report of recent out-patient ultrasound - Able to move her knee but to a limited extent, doubt septic arthritis. Will consult Ortho to help rule out.  Acute kidney injury superimposed on chronic kidney disease (HCC) Creatinine 2.09, baseline  1.3-1.7.  Baseline CKD stage IIIb -Hydrate -Hold lisinopril   Severe sepsis (HCC) Meeting severe sepsis criteria with leukocytosis of 20.5, and tachypnea respiratory rate 18-29.  Evidence of endorgan dysfunction AKI on CKD 3b.  Normal lactic acid 1.5. -Follow-up blood cultures - 2.5 L  bolus given, hold further fluids. - Reports some dysuria, f/u UA  Rash- diffuse rash to bilateral lower extremities superimposed on cellulitis. ?? question if this is of other etiology, related to the cellulitis, or drug allergy-she was on Keflex . - Holding further cephalosporins.   Chronic diastolic CHF (congestive heart failure) (HCC) Stable and compensated.  Last echo 01/2023 EF 60 to 65%, G1DD.  She reports compliance with Lasix . - 2.5 L bolus given, resume home Lasix  20 mg daily pending stable blood pressure.  Atrial fibrillation (HCC) Rate controlled and on anticoagulation. -Resume metoprolol , diltiazem  and Eliquis   Primary hypertension Blood pressure systolic ranging from 90-135. -Resume metoprolol  for continued rate control of atrial fibrillation -Hold Cardizem , lisinopril  Lasix , resume when able  DVT prophylaxis: Eliquis  Code Status: FULL Family Communication: None at bedside Disposition Plan: ~ 2 days Consults called: Ortho Admission status: Inpt Tele  I certify that at the point of admission it is my clinical judgment that the patient will require inpatient hospital care spanning beyond 2 midnights from the point of admission due to high intensity of service, high risk for further deterioration and high frequency of surveillance required.  Author: Pati Bonine, MD 10/22/2023 8:16 PM  For on call review [email or web address].

## 2023-10-23 DIAGNOSIS — L03115 Cellulitis of right lower limb: Secondary | ICD-10-CM | POA: Diagnosis not present

## 2023-10-23 LAB — CBC
HCT: 35.5 % — ABNORMAL LOW (ref 36.0–46.0)
Hemoglobin: 11.2 g/dL — ABNORMAL LOW (ref 12.0–15.0)
MCH: 32.5 pg (ref 26.0–34.0)
MCHC: 31.5 g/dL (ref 30.0–36.0)
MCV: 102.9 fL — ABNORMAL HIGH (ref 80.0–100.0)
Platelets: 203 10*3/uL (ref 150–400)
RBC: 3.45 MIL/uL — ABNORMAL LOW (ref 3.87–5.11)
RDW: 13 % (ref 11.5–15.5)
WBC: 15.3 10*3/uL — ABNORMAL HIGH (ref 4.0–10.5)
nRBC: 0 % (ref 0.0–0.2)

## 2023-10-23 LAB — BASIC METABOLIC PANEL WITH GFR
Anion gap: 7 (ref 5–15)
BUN: 52 mg/dL — ABNORMAL HIGH (ref 8–23)
CO2: 21 mmol/L — ABNORMAL LOW (ref 22–32)
Calcium: 8.4 mg/dL — ABNORMAL LOW (ref 8.9–10.3)
Chloride: 113 mmol/L — ABNORMAL HIGH (ref 98–111)
Creatinine, Ser: 1.58 mg/dL — ABNORMAL HIGH (ref 0.44–1.00)
GFR, Estimated: 33 mL/min — ABNORMAL LOW (ref >60)
Glucose, Bld: 95 mg/dL (ref 70–99)
Potassium: 4.3 mmol/L (ref 3.5–5.1)
Sodium: 141 mmol/L (ref 135–145)

## 2023-10-23 LAB — HEMOGLOBIN A1C
Hgb A1c MFr Bld: 5.2 % (ref 4.8–5.6)
Mean Plasma Glucose: 102.54 mg/dL

## 2023-10-23 MED ORDER — SODIUM CHLORIDE 0.9 % IV SOLN
2.0000 g | INTRAVENOUS | Status: DC
  Administered 2023-10-23 – 2023-10-28 (×5): 2 g via INTRAVENOUS
  Filled 2023-10-23 (×6): qty 20

## 2023-10-23 NOTE — Progress Notes (Signed)
 PROGRESS NOTE   Denise Underwood, is a 78 y.o. female, DOB - 01-28-1946, WUJ:[id number]  Admit date - 10/22/2023   Admitting Physician Ejiroghene Debroah Fanning, MD  Outpatient Primary MD for the patient is [name], [name], FNP  LOS - 1  Chief Complaint  Patient presents with   Leg Swelling      Brief Narrative:  78 y.o. female with medical history significant for diastolic CHF, atrial fibrillation, COPD, hypertension, CKD 3, psoriasis, chronic respiratory failure on as needed O2 admitted on 10/22/2023 with right leg cellulitis after failing antibiotics as outpatient    -Assessment and Plan: 1)Sepsis secondary to cellulitis of Right Lower Extremity--POA -Completed course of Keflex  and took Doxy for 2 days Prior to admission -Significant right leg erythema, swelling, warmth and tenderness 10/23/23 WBC 20.5 >>15.3 -Continue IV vancomycin  -Stop aztreonam -Start IV Rocephin  --Significant swelling and erythema to right lower extremity.  -The patient states that outpatient venous Dopplers of the right lower extremity was negative for DVT report not available to me - Patient also PTA has been compliant with Eliquis  (for Afib)  2)Acute kidney injury superimposed on chronic kidney disease (HCC) AKI----acute kidney injury on CKD stage -3B --  creatinine on admission=2.09  ,  -baseline creatinine = 1.3 to 1.7 ,  creatinine is now=1.58  , -- renally adjust medications, avoid nephrotoxic agents / dehydration  / hypotension -Hold lisinopril  - Hold Lasix   3)Chronic diastolic CHF (congestive heart failure) (HCC) Stable and compensated.  Last echo 01/2023 EF 60 to 65%, G1DD. \ -Received IV fluids on admission per sepsis protocol - Hold Lasix   4)Chronic Atrial fibrillation (HCC) -Continue Eliquis  for stroke prophylaxis, - Continue metoprolol  25 mg twice daily for rate control - Cardizem  on hold for now  5)HTN- -Lasix , lisinopril  and Cardizem  on hold as above -- Continue metoprolol  for rate  control as above #4  Status is: Inpatient   Disposition: The patient is from: Home              Anticipated d/c is to: Home              Anticipated d/c date is: 2 days              Patient currently is not medically stable to d/c. Barriers: Not Clinically Stable-   Code Status :  -  Code Status: Full Code   Family Communication:    NA (patient is alert, awake and coherent)   DVT Prophylaxis  :   - SCDs *   apixaban  (ELIQUIS ) tablet 5 mg   Lab Results  Component Value Date   PLT 203 10/23/2023   Inpatient Medications  Scheduled Meds:  apixaban   5 mg Oral BID   escitalopram   20 mg Oral Daily   fluticasone  furoate-vilanterol  1 puff Inhalation Daily   metoprolol  tartrate  25 mg Oral BID   pantoprazole   40 mg Oral Daily   traZODone   50 mg Oral QHS   Continuous Infusions:  aztreonam 1 g (10/23/23 0515)   [START ON 10/24/2023] vancomycin      PRN Meds:.acetaminophen  **OR** acetaminophen , albuterol , HYDROcodone-acetaminophen , ondansetron  **OR** ondansetron  (ZOFRAN ) IV, polyethylene glycol   Anti-infectives (From admission, onward)    Start     Dose/Rate Route Frequency Ordered Stop   10/24/23 1400  vancomycin  (VANCOCIN ) IVPB 1000 mg/200 mL premix        1,000 mg 200 mL/hr over 60 Minutes Intravenous Every 48 hours 10/22/23 1435     10/23/23 0600  aztreonam (AZACTAM)  1 g in sodium chloride  0.9 % 100 mL IVPB        1 g 200 mL/hr over 30 Minutes Intravenous Every 8 hours 10/22/23 2116     10/22/23 1445  vancomycin  (VANCOREADY) IVPB 1750 mg/350 mL        1,750 mg 175 mL/hr over 120 Minutes Intravenous  Once 10/22/23 1434 10/22/23 1721   10/22/23 1430  cefTRIAXone  (ROCEPHIN ) 2 g in sodium chloride  0.9 % 100 mL IVPB        2 g 200 mL/hr over 30 Minutes Intravenous Once 10/22/23 1421 10/22/23 1512      Subjective: Denise Underwood today has no fevers, no emesis,  No chest pain,   - Right leg swelling and erythema persist - Denies significant leg pains or pleuritic  symptoms - Objective: Vitals:   10/23/23 0201 10/23/23 0446 10/23/23 0700 10/23/23 0730  BP: (!) 111/58 (!) 122/47  119/65  Pulse: 82 91  88  Resp: 20 20    Temp: 98.2 F (36.8 C) 98.7 F (37.1 C)    TempSrc: Oral Oral    SpO2: 94% 93% 90% 95%  Weight:      Height:        Intake/Output Summary (Last 24 hours) at 10/23/2023 0941 Last data filed at 10/23/2023 1610 Gross per 24 hour  Intake 3619.24 ml  Output --  Net 3619.24 ml   Filed Weights   10/22/23 1337  Weight: 79.8 kg    Physical Exam  Gen:- Awake Alert, in no acute distress HEENT:- [place].AT, No sclera icterus Neck-Supple Neck,No JVD,.  Lungs-  CTAB , fair symmetrical air movement CV- S1, S2 normal, regular  Abd-  +ve B.Sounds, Abd Soft, No tenderness,    Extremity/Skin:- +ve pitting edema, pedal pulses present  Psych-affect is appropriate, oriented x3 Neuro-no new focal deficits, no tremors MSK--right leg swelling, erythema, warmth, tenderness--please see photos in epic - Erythematous macular lesions over left lower extremity--please see photos in epic  Data Reviewed: I have personally reviewed following labs and imaging studies  CBC: Recent Labs  Lab 10/22/23 1358 10/23/23 0428  WBC 20.5* 15.3*  NEUTROABS 12.2*  --   HGB 12.7 11.2*  HCT 40.7 35.5*  MCV 105.2* 102.9*  PLT 250 203   Basic Metabolic Panel: Recent Labs  Lab 10/22/23 1358 10/23/23 0428  NA 142 141  K 4.6 4.3  CL 110 113*  CO2 21* 21*  GLUCOSE 113* 95  BUN 64* 52*  CREATININE 2.09* 1.58*  CALCIUM  8.8* 8.4*   GFR: Estimated Creatinine Clearance: 30 mL/min (A) (by C-G formula based on SCr of 1.58 mg/dL (H)). Liver Function Tests: Recent Labs  Lab 10/22/23 1358  AST 16  ALT 11  ALKPHOS 88  BILITOT 0.5  PROT 6.6  ALBUMIN 3.4*   HbA1C: Recent Labs    10/23/23 0428  HGBA1C 5.2   Recent Results (from the past 240 hours)  Blood culture (routine x 2)     Status: None (Preliminary result)   Collection Time: 10/22/23  1:58  PM   Specimen: BLOOD  Result Value Ref Range Status   Specimen Description BLOOD BLOOD RIGHT ARM  Final   Special Requests   Final    Blood Culture results may not be optimal due to an inadequate volume of blood received in culture bottles BOTTLES DRAWN AEROBIC AND ANAEROBIC   Culture   Final    NO GROWTH < 24 HOURS Performed at [facility], [address] [place], [place] [address]  Report Status PENDING  Incomplete  Blood culture (routine x 2)     Status: None (Preliminary result)   Collection Time: 10/22/23  1:58 PM   Specimen: BLOOD  Result Value Ref Range Status   Specimen Description BLOOD BLOOD LEFT ARM  Final   Special Requests   Final    Blood Culture results may not be optimal due to an inadequate volume of blood received in culture bottles BOTTLES DRAWN AEROBIC AND ANAEROBIC   Culture   Final    NO GROWTH < 24 HOURS Performed at [facility], [address], [place], [place] [address]    Report Status PENDING  Incomplete    Radiology Studies: DG Chest Portable 1 View Result Date: 10/22/2023 CLINICAL DATA:  weakness, sepsis EXAM: PORTABLE CHEST - 1 VIEW COMPARISON:  March 03, 2023 FINDINGS: No focal airspace consolidation, pleural effusion, or pneumothorax. No cardiomegaly. Aortic atherosclerosis. No acute fracture or destructive lesion. Multilevel thoracic osteophytosis. IMPRESSION: No acute cardiopulmonary abnormality. Electronically Signed   By: Rance Burrows M.D.   On: 10/22/2023 15:48   Scheduled Meds:  apixaban   5 mg Oral BID   escitalopram   20 mg Oral Daily   fluticasone  furoate-vilanterol  1 puff Inhalation Daily   metoprolol  tartrate  25 mg Oral BID   pantoprazole   40 mg Oral Daily   traZODone   50 mg Oral QHS   Continuous Infusions:  aztreonam 1 g (10/23/23 0515)   [START ON 10/24/2023] vancomycin       LOS: 1 day   Colin Dawley M.D on 10/23/2023 at 9:41 AM  Go to [email or web address] - for contact info  [facility] - Office  [phone]  If  7PM-7AM, please contact night-coverage [email or web address] 10/23/2023, 9:41 AM

## 2023-10-23 NOTE — TOC CM/SW Note (Signed)
 Transition of Care [facility]) - Inpatient Brief Assessment   Patient Details  Name: Denise Underwood MRN: [id number] Date of Birth: 07/02/1945  Transition of Care [facility]) CM/SW Contact:    Grandville Lax, LCSWA Phone Number: 10/23/2023, 9:13 AM   Clinical Narrative: Transition of Care Department [facility]) has reviewed patient and no TOC needs have been identified at this time. We will continue to monitor patient advancement through interdiciplinary progression rounds. If new patient transition needs arise, please place a TOC consult.  Transition of Care Asessment: Insurance and Status: Insurance coverage has been reviewed Patient has primary care physician: Yes Home environment has been reviewed: From home Prior level of function:: Independent Prior/Current Home Services: No current home services Social Drivers of Health Review: SDOH reviewed no interventions necessary Readmission risk has been reviewed: Yes Transition of care needs: no transition of care needs at this time

## 2023-10-23 NOTE — Plan of Care (Signed)

## 2023-10-24 DIAGNOSIS — L03115 Cellulitis of right lower limb: Secondary | ICD-10-CM | POA: Diagnosis not present

## 2023-10-24 LAB — CBC
HCT: 34.9 % — ABNORMAL LOW (ref 36.0–46.0)
Hemoglobin: 10.9 g/dL — ABNORMAL LOW (ref 12.0–15.0)
MCH: 32.4 pg (ref 26.0–34.0)
MCHC: 31.2 g/dL (ref 30.0–36.0)
MCV: 103.9 fL — ABNORMAL HIGH (ref 80.0–100.0)
Platelets: 180 10*3/uL (ref 150–400)
RBC: 3.36 MIL/uL — ABNORMAL LOW (ref 3.87–5.11)
RDW: 13.2 % (ref 11.5–15.5)
WBC: 12.8 10*3/uL — ABNORMAL HIGH (ref 4.0–10.5)
nRBC: 0 % (ref 0.0–0.2)

## 2023-10-24 LAB — RENAL FUNCTION PANEL
Albumin: 2.3 g/dL — ABNORMAL LOW (ref 3.5–5.0)
Anion gap: 7 (ref 5–15)
BUN: 42 mg/dL — ABNORMAL HIGH (ref 8–23)
CO2: 24 mmol/L (ref 22–32)
Calcium: 8.3 mg/dL — ABNORMAL LOW (ref 8.9–10.3)
Chloride: 113 mmol/L — ABNORMAL HIGH (ref 98–111)
Creatinine, Ser: 1.26 mg/dL — ABNORMAL HIGH (ref 0.44–1.00)
GFR, Estimated: 44 mL/min — ABNORMAL LOW (ref >60)
Glucose, Bld: 108 mg/dL — ABNORMAL HIGH (ref 70–99)
Phosphorus: 3.7 mg/dL (ref 2.5–4.6)
Potassium: 4.5 mmol/L (ref 3.5–5.1)
Sodium: 144 mmol/L (ref 135–145)

## 2023-10-24 LAB — CREATININE, SERUM
Creatinine, Ser: 1.2 mg/dL — ABNORMAL HIGH (ref 0.44–1.00)
GFR, Estimated: 46 mL/min — ABNORMAL LOW (ref >60)

## 2023-10-24 MED ORDER — DILTIAZEM HCL 30 MG PO TABS
30.0000 mg | ORAL_TABLET | Freq: Four times a day (QID) | ORAL | Status: DC
  Administered 2023-10-24 – 2023-10-25 (×4): 30 mg via ORAL
  Filled 2023-10-24 (×4): qty 1

## 2023-10-24 MED ORDER — CLOBETASOL PROPIONATE 0.05 % EX OINT
TOPICAL_OINTMENT | Freq: Two times a day (BID) | CUTANEOUS | Status: DC
  Administered 2023-10-26 (×2): 1 via TOPICAL
  Filled 2023-10-24 (×3): qty 15

## 2023-10-24 NOTE — Care Management Important Message (Signed)
 Important Message  Patient Details  Name: Denise Underwood MRN: [id number] Date of Birth: [birth year]   Important Message Given:  Yes - Medicare IM     Mildreth Reek L Makar Slatter 10/24/2023, 10:48 AM

## 2023-10-24 NOTE — Plan of Care (Signed)
   Problem: Activity: Goal: Risk for activity intolerance will decrease Outcome: Progressing   Problem: Coping: Goal: Level of anxiety will decrease Outcome: Progressing   Problem: Safety: Goal: Ability to remain free from injury will improve Outcome: Progressing

## 2023-10-24 NOTE — Progress Notes (Signed)
 Mobility Specialist Progress Note:    10/24/23 1000  Mobility  Activity Ambulated with assistance in hallway  Level of Assistance Contact guard assist, steadying assist  Assistive Device None (HHA)  Distance Ambulated (ft) 130 ft  Range of Motion/Exercises Active;All extremities  Activity Response Tolerated well  Mobility Referral Yes  Mobility visit 1 Mobility  Mobility Specialist Start Time (ACUTE ONLY) 1000  Mobility Specialist Stop Time (ACUTE ONLY) 1022  Mobility Specialist Time Calculation (min) (ACUTE ONLY) 22 min   Pt received in bed, agreeable to mobility. Required hand held assist to stand and ambulate with no AD. Tolerated well,asx throughout. Left pt sitting EOB,all needs met.  Lai Hendriks Mobility Specialist Please contact via Special educational needs teacher or  Rehab office at [phone]

## 2023-10-24 NOTE — Progress Notes (Signed)
 PROGRESS NOTE  Denise Underwood, is a 78 y.o. female, DOB - [birth year], ZOX:[id number]  Admit date - 10/22/2023   Admitting Physician Ejiroghene Debroah Fanning, MD  Outpatient Primary MD for the patient is [name], [name], FNP  LOS - 2  Chief Complaint  Patient presents with   Leg Swelling      Brief Narrative:  78 y.o. female with medical history significant for diastolic CHF, atrial fibrillation, COPD, hypertension, CKD 3, psoriasis, chronic respiratory failure on as needed O2 admitted on 10/22/2023 with right leg cellulitis after failing antibiotics as outpatient    -Assessment and Plan: 1)Sepsis secondary to cellulitis of Right Lower Extremity--POA -Completed course of Keflex  and took Doxy for 2 days Prior to admission -Significant right leg erythema, swelling, warmth and tenderness---Denies significant leg pains or pleuritic symptoms (on Eliquis  for Afib) 10/24/23 WBC 20.5 >>15.3>> 12.8 Afebrile -Continue IV vancomycin  -Stopped aztreonam  -Started IV Rocephin  on 10/23/23 --Significant swelling and erythema to right lower extremity.  -The patient states that outpatient venous Dopplers of the right lower extremity was negative for DVT report not available to me - Patient also PTA has been compliant with Eliquis  (for Afib)  2)Acute kidney injury superimposed on chronic kidney disease (HCC) AKI----acute kidney injury on CKD stage -3B --  creatinine on admission=2.09  ,  -baseline creatinine = 1.3 to 1.7 ,  creatinine is now=1.26 (improving), -- renally adjust medications, avoid nephrotoxic agents / dehydration  / hypotension -Hold lisinopril  - Hold Lasix   3)Chronic diastolic CHF (congestive heart failure) (HCC) Stable and compensated.  Last echo 01/2023 EF 60 to 65%, G1DD.  -Received IV fluids on admission per sepsis protocol - Hold Lasix   4)Chronic Atrial fibrillation (HCC) -Continue Eliquis  for stroke prophylaxis, 10/24/23 -Had tachycardia  after missing dose of metoprolol  last  night--and PTA Cardizem  is on hold ---heart rate in the 120s to 140s--- denies dizziness or significant palpitations --BP has been soft - Stop metoprolol  - Stop Cardizem  30 mg Q6h - Echo and LVEF as above #3  5)HTN- -Lasix , lisinopril  and Cardizem  on hold as above --BP has been soft - BP and Cardizem  adjusted as Above in # 4  6)Psoriatic Skin Changes --Legs mostly----topical Clobetasol  oint as ordered   Status is: Inpatient   Disposition: The patient is from: Home              Anticipated d/c is to: Home              Anticipated d/c date is: 2 days              Patient currently is not medically stable to d/c. Barriers: Not Clinically Stable-   Code Status :  -  Code Status: Full Code   Family Communication:    NA (patient is alert, awake and coherent)   DVT Prophylaxis  :   - SCDs/ apixaban  (ELIQUIS ) tablet 5 mg   Lab Results  Component Value Date   PLT 180 10/24/2023   Inpatient Medications  Scheduled Meds:  apixaban   5 mg Oral BID   clobetasol  ointment   Topical BID   diltiazem   30 mg Oral Q6H   escitalopram   20 mg Oral Daily   fluticasone  furoate-vilanterol  1 puff Inhalation Daily   pantoprazole   40 mg Oral Daily   traZODone   50 mg Oral QHS   Continuous Infusions:  cefTRIAXone  (ROCEPHIN )  IV Stopped (10/23/23 1411)   vancomycin      PRN Meds:.acetaminophen  **OR** acetaminophen , albuterol , HYDROcodone -acetaminophen , ondansetron  **OR** ondansetron  (ZOFRAN )  IV, polyethylene glycol   Anti-infectives (From admission, onward)    Start     Dose/Rate Route Frequency Ordered Stop   10/24/23 1400  vancomycin  (VANCOCIN ) IVPB 1000 mg/200 mL premix        1,000 mg 200 mL/hr over 60 Minutes Intravenous Every 48 hours 10/22/23 1435     10/23/23 1400  cefTRIAXone  (ROCEPHIN ) 2 g in sodium chloride  0.9 % 100 mL IVPB        2 g 200 mL/hr over 30 Minutes Intravenous Every 24 hours 10/23/23 0948     10/23/23 0600  aztreonam  (AZACTAM ) 1 g in sodium chloride  0.9 % 100 mL IVPB   Status:  Discontinued        1 g 200 mL/hr over 30 Minutes Intravenous Every 8 hours 10/22/23 2116 10/23/23 0948   10/22/23 1445  vancomycin  (VANCOREADY) IVPB 1750 mg/350 mL        1,750 mg 175 mL/hr over 120 Minutes Intravenous  Once 10/22/23 1434 10/22/23 1721   10/22/23 1430  cefTRIAXone  (ROCEPHIN ) 2 g in sodium chloride  0.9 % 100 mL IVPB        2 g 200 mL/hr over 30 Minutes Intravenous Once 10/22/23 1421 10/22/23 1512      Subjective: Vertie Gosling today has no fevers, no emesis,  No chest pain,   - Right leg swelling and erythema persist - Denies significant leg pains or pleuritic symptoms -Had tachycardia  after missing dose of metoprolol  last night--and PTA Cardizem  is on hold ---heart rate in the 120s to 140s--- denies dizziness or significant palpitations --BP has been soft - Objective: Vitals:   10/24/23 0612 10/24/23 0717 10/24/23 0741 10/24/23 1316  BP: (!) 103/59  119/60 99/64  Pulse: 99  (!) 121 92  Resp: 18  20 18   Temp: 97.8 F (36.6 C)   98.1 F (36.7 C)  TempSrc: Oral   Oral  SpO2: 93% 93% 93% 96%  Weight:      Height:        Intake/Output Summary (Last 24 hours) at 10/24/2023 1403 Last data filed at 10/23/2023 1528 Gross per 24 hour  Intake 100.06 ml  Output --  Net 100.06 ml   Filed Weights   10/22/23 1337  Weight: 79.8 kg    Physical Exam Gen:- Awake Alert, in no acute distress HEENT:- [place].AT, No sclera icterus Neck-Supple Neck,No JVD,.  Lungs-  CTAB , fair symmetrical air movement CV- S1, S2 normal, irregular , tachy Abd-  +ve B.Sounds, Abd Soft, No tenderness,    Extremity/Skin:- +ve pitting edema, pedal pulses present  Psych-affect is appropriate, oriented x3 Neuro-generalized weakness, no new focal deficits, no tremors MSK--right leg swelling, erythema, warmth, tenderness--please see photos in epic - Erythematous macular lesions over left lower extremity--please see photos in epic  Data Reviewed: I have personally reviewed following labs  and imaging studies  CBC: Recent Labs  Lab 10/22/23 1358 10/23/23 0428 10/24/23 0421  WBC 20.5* 15.3* 12.8*  NEUTROABS 12.2*  --   --   HGB 12.7 11.2* 10.9*  HCT 40.7 35.5* 34.9*  MCV 105.2* 102.9* 103.9*  PLT 250 203 180   Basic Metabolic Panel: Recent Labs  Lab 10/22/23 1358 10/23/23 0428 10/24/23 0421 10/24/23 0422  NA 142 141  --  144  K 4.6 4.3  --  4.5  CL 110 113*  --  113*  CO2 21* 21*  --  24  GLUCOSE 113* 95  --  108*  BUN 64* 52*  --  42*  CREATININE 2.09* 1.58* 1.20* 1.26*  CALCIUM  8.8* 8.4*  --  8.3*  PHOS  --   --   --  3.7   GFR: Estimated Creatinine Clearance: 37.6 mL/min (A) (by C-G formula based on SCr of 1.26 mg/dL (H)). Liver Function Tests: Recent Labs  Lab 10/22/23 1358 10/24/23 0422  AST 16  --   ALT 11  --   ALKPHOS 88  --   BILITOT 0.5  --   PROT 6.6  --   ALBUMIN 3.4* 2.3*   HbA1C: Recent Labs    10/23/23 0428  HGBA1C 5.2   Recent Results (from the past 240 hours)  Blood culture (routine x 2)     Status: None (Preliminary result)   Collection Time: 10/22/23  1:58 PM   Specimen: BLOOD  Result Value Ref Range Status   Specimen Description BLOOD BLOOD RIGHT ARM  Final   Special Requests   Final    Blood Culture results may not be optimal due to an inadequate volume of blood received in culture bottles BOTTLES DRAWN AEROBIC AND ANAEROBIC   Culture   Final    NO GROWTH 2 DAYS Performed at [facility], [address], [place], [place] [address]    Report Status PENDING  Incomplete  Blood culture (routine x 2)     Status: None (Preliminary result)   Collection Time: 10/22/23  1:58 PM   Specimen: BLOOD  Result Value Ref Range Status   Specimen Description BLOOD BLOOD LEFT ARM  Final   Special Requests   Final    Blood Culture results may not be optimal due to an inadequate volume of blood received in culture bottles BOTTLES DRAWN AEROBIC AND ANAEROBIC   Culture   Final    NO GROWTH 2 DAYS Performed at [facility], [address] [place], [place] [address]    Report Status PENDING  Incomplete    Radiology Studies: DG Chest Portable 1 View Result Date: 10/22/2023 CLINICAL DATA:  weakness, sepsis EXAM: PORTABLE CHEST - 1 VIEW COMPARISON:  March 03, 2023 FINDINGS: No focal airspace consolidation, pleural effusion, or pneumothorax. No cardiomegaly. Aortic atherosclerosis. No acute fracture or destructive lesion. Multilevel thoracic osteophytosis. IMPRESSION: No acute cardiopulmonary abnormality. Electronically Signed   By: Rance Burrows M.D.   On: 10/22/2023 15:48   Scheduled Meds:  apixaban   5 mg Oral BID   clobetasol  ointment   Topical BID   diltiazem   30 mg Oral Q6H   escitalopram   20 mg Oral Daily   fluticasone  furoate-vilanterol  1 puff Inhalation Daily   pantoprazole   40 mg Oral Daily   traZODone   50 mg Oral QHS   Continuous Infusions:  cefTRIAXone  (ROCEPHIN )  IV Stopped (10/23/23 1411)   vancomycin       LOS: 2 days   Colin Dawley M.D on 10/24/2023 at 2:03 PM  Go to [email or web address] - for contact info  [facility] - Office  [phone]  If 7PM-7AM, please contact night-coverage [email or web address] 10/24/2023, 2:03 PM

## 2023-10-24 NOTE — Progress Notes (Signed)
   10/24/23 0741  Vitals  BP 119/60  MAP (mmHg) 76  BP Location Left Arm  BP Method Automatic  Patient Position (if appropriate) Lying  Pulse Rate (!) 121  Resp 20  Level of Consciousness  Level of Consciousness Alert  MEWS COLOR  MEWS Score Color Yellow  Oxygen  Therapy  SpO2 93 %  O2 Device Room Air  Pain Assessment  Pain Scale 0-10  Pain Score 0  MEWS Score  MEWS Temp 0  MEWS Systolic 0  MEWS Pulse 2  MEWS RR 0  MEWS LOC 0  MEWS Score 2   Notified by central tele that pt's HR up into 140-150's afib. Arrived to room to find pt sitting up in bed eating breakfast. Pt A&O, denies any c/o. VSS as above. Lungs clear, heart rate irregular apically. Pt did not receive her 10pm dose of Lopressor  last pm per note on eMAR. MD Courage notified of above heart rate and current pt condition. Awaiting response.

## 2023-10-25 DIAGNOSIS — L03115 Cellulitis of right lower limb: Secondary | ICD-10-CM | POA: Diagnosis not present

## 2023-10-25 LAB — BASIC METABOLIC PANEL WITH GFR
Anion gap: 5 (ref 5–15)
BUN: 36 mg/dL — ABNORMAL HIGH (ref 8–23)
CO2: 22 mmol/L (ref 22–32)
Calcium: 8.2 mg/dL — ABNORMAL LOW (ref 8.9–10.3)
Chloride: 111 mmol/L (ref 98–111)
Creatinine, Ser: 1.23 mg/dL — ABNORMAL HIGH (ref 0.44–1.00)
GFR, Estimated: 45 mL/min — ABNORMAL LOW (ref >60)
Glucose, Bld: 119 mg/dL — ABNORMAL HIGH (ref 70–99)
Potassium: 4.2 mmol/L (ref 3.5–5.1)
Sodium: 138 mmol/L (ref 135–145)

## 2023-10-25 LAB — CBC
HCT: 33.7 % — ABNORMAL LOW (ref 36.0–46.0)
Hemoglobin: 10.9 g/dL — ABNORMAL LOW (ref 12.0–15.0)
MCH: 33.4 pg (ref 26.0–34.0)
MCHC: 32.3 g/dL (ref 30.0–36.0)
MCV: 103.4 fL — ABNORMAL HIGH (ref 80.0–100.0)
Platelets: 201 10*3/uL (ref 150–400)
RBC: 3.26 MIL/uL — ABNORMAL LOW (ref 3.87–5.11)
RDW: 13 % (ref 11.5–15.5)
WBC: 14.4 10*3/uL — ABNORMAL HIGH (ref 4.0–10.5)
nRBC: 0 % (ref 0.0–0.2)

## 2023-10-25 MED ORDER — METOPROLOL TARTRATE 25 MG PO TABS
12.5000 mg | ORAL_TABLET | Freq: Two times a day (BID) | ORAL | Status: DC
  Administered 2023-10-26 – 2023-10-28 (×5): 12.5 mg via ORAL
  Filled 2023-10-25 (×5): qty 1

## 2023-10-25 MED ORDER — DILTIAZEM HCL ER COATED BEADS 180 MG PO CP24
180.0000 mg | ORAL_CAPSULE | Freq: Every day | ORAL | Status: DC
  Administered 2023-10-25 – 2023-10-28 (×4): 180 mg via ORAL
  Filled 2023-10-25 (×4): qty 1

## 2023-10-25 NOTE — Progress Notes (Signed)
 Pharmacy Antibiotic Note  Denise Underwood is a 78 y.o. female admitted on 10/22/2023 with cellulitis.  Pharmacy has been consulted for vancomycin  dosing.  Patient currently afebrile, wbc up slightly to 14. Renal function stable. Cultures continue to be no growth.   Plan: Vancomycin  1000 mg IV every 48 hours. Possible de-escalation today Monitor labs, c/s, and vanco levels as indicated.  Height: 5' 4 (162.6 cm) Weight: 79.8 kg (176 lb) IBW/kg (Calculated) : 54.7  Temp (24hrs), Avg:98.2 F (36.8 C), Min:98.1 F (36.7 C), Max:98.3 F (36.8 C)  Recent Labs  Lab 10/22/23 1358 10/23/23 0428 10/24/23 0421 10/24/23 0422 10/25/23 0253  WBC 20.5* 15.3* 12.8*  --  14.4*  CREATININE 2.09* 1.58* 1.20* 1.26* 1.23*  LATICACIDVEN 1.5  --   --   --   --     Estimated Creatinine Clearance: 38.5 mL/min (A) (by C-G formula based on SCr of 1.23 mg/dL (H)).    No Known Allergies  Antimicrobials this admission: Vanco 6/11 >> CTX 6/11>  Microbiology results: 6/11 BCx: ngtd   Thank you for allowing pharmacy to be a part of this patient's care.  Audra Blend PharmD., BCPS Clinical Pharmacist 10/25/2023 8:52 AM

## 2023-10-25 NOTE — Progress Notes (Deleted)
 Cardiology Office Note:    Date:  10/25/2023   ID:  Denise Underwood, DOB 1945/06/10, MRN [id number]  PCP:  Denise Rock HERO, FNP  Cardiologist:  Lonni LITTIE [name], MD { Click to update primary MD,subspecialty MD or APP then REFRESH:1}    Referring MD: Denise Rock HERO, FNP   Chief Complaint: follow-up of atrial fibrillation and CHF  History of Present Illness:    Denise Underwood is a 78 y.o. female with a history of paroxysmal atrial fibrillation on Eliquis , chronic diastolic CHF, mild aortic stenosis, COPD with chronic hypoxic respiratory failure on O2 PRN at home,  hypertension, hyperlipidemia, prediabetes, CKD stage III, and tobacco abuse who is followed by Dr. [name] and presents today for routine follow-up.   Patient was first seen by Cardiology during an admission in 01/2023 for septic shock secondary to community acquired pneumonia. She developed new onset atrial fibrillation that admission and was also felt to have some degree of acute on chronic diastolic CHF.  Echo showed LVEF of 60-65% with grade 1 diastolic dysfunction, normal RV, and mild aortic stenosis (mean gradient 14 mmHg). She converted back to sinus rhythm with rate control agents alone.   She was last seen by Denise Louder, PA-C, in 05/2023 at which time she reported occasional lower extremity edema that was relatively well controlled with Lasix  and was otherwise doing well from a cardiac standpoint.   She was recently admitted from 10/22/2023 to 10/27/2023 for sepsis secondary to cellulitis of right lower extremity edema after failing outpatient treatment with oral antibiotics. She was treated with IV antibiotics. Hospitalized was complicated by AKI. Home Lasix  and Lisinopril  were held and she was treated with IV fluids. ***  Patient presents today for follow-up. ***  Paroxysmal Atrial Fibrillation Diagnosed in 01/2023 in setting of sepsis secondary to pneumonia.  - Maintaining sinus rhythm on exam. *** - Continue  Diltiazem  180mg  daily and Lopressor  25mg  twice daily. - Continue Eliquis  5mg  twice daily.   Chronic Diastolic CHF Last Echo in 01/2023 showed LVEF of 60-65% with grade 1 diastolic dysfunction.  - Euvolemic on exam. *** - Continue Lasix  *** -   Hypertension BP well controlled *** - Continue current medications: Diltiazem  180mg  daily, Lopressor  25mt twice daily, and Lisinopril  5mg  daily. ***  Hyperlipidemia Lipid panel on 10/14/2023: Total Cholesterol 257, Triglycerides 231, HDL 48, LDL 166. LDL <70 given aortic atherosclerosis noted on prior CT.  - Not on a statin. ***  CKD Stage III Recent hospitalization for sepsis secondary to cellulitis complicated by AKI with creatinine peaking at 2.09. However, creatinine improved to *** at discharge.  - Will recheck BMET. ***   EKGs/Labs/Other Studies Reviewed:    The following studies were reviewed:  Echocardiogram 02/04/2023: Impressions: 1. Left ventricular ejection fraction, by estimation, is 60 to 65%. The  left ventricle has normal function. The left ventricle has no regional  wall motion abnormalities. Left ventricular diastolic parameters are  consistent with Grade I diastolic  dysfunction (impaired relaxation).   2. Right ventricular systolic function is normal. The right ventricular  size is mildly enlarged. There is normal pulmonary artery systolic  pressure.   3. Left atrial size was mildly dilated.   4. Right atrial size was mildly dilated.   5. The mitral valve is degenerative. No evidence of mitral valve  regurgitation. No evidence of mitral stenosis.   6. The aortic valve is calcified. Aortic valve regurgitation is not  visualized. Mild aortic valve stenosis. Aortic valve mean gradient  measures 14.0 mmHg. Aortic valve Vmax measures 2.48 m/s.   7. The inferior vena cava is dilated in size with <50% respiratory  variability, suggesting right atrial pressure of 15 mmHg.   EKG:  EKG not ordered today.   Recent  Labs: 02/19/2023: Magnesium  1.5 10/14/2023: TSH 2.740 10/22/2023: ALT 11; B Natriuretic Peptide 58.0 10/25/2023: BUN 36; Creatinine, Ser 1.23; Hemoglobin 10.9; Platelets 201; Potassium 4.2; Sodium 138  Recent Lipid Panel    Component Value Date/Time   CHOL 257 (H) 10/14/2023 0915   TRIG 231 (H) 10/14/2023 0915   HDL 48 10/14/2023 0915   CHOLHDL 5.4 (H) 10/14/2023 0915   LDLCALC 166 (H) 10/14/2023 0915    Physical Exam:    Vital Signs: There were no vitals taken for this visit.    Wt Readings from Last 3 Encounters:  10/22/23 176 lb (79.8 kg)  10/14/23 176 lb (79.8 kg)  06/27/23 170 lb 3.2 oz (77.2 kg)     General: 78 y.o. female in no acute distress. HEENT: Normocephalic and atraumatic. Sclera clear.  Neck: Supple. No carotid bruits. No JVD. Heart: *** RRR. Distinct S1 and S2. No murmurs, gallops, or rubs.  Lungs: No increased work of breathing. Clear to ausculation bilaterally. No wheezes, rhonchi, or rales.  Abdomen: Soft, non-distended, and non-tender to palpation.  Extremities: No lower extremity edema.  Radial and distal pedal pulses 2+ and equal bilaterally. Skin: Warm and dry. Neuro: No focal deficits. Psych: Normal affect. Responds appropriately.   Assessment:    No diagnosis found.  Plan:     Disposition: Follow up in ***   Signed, Denise FORBES Door, PA-C  10/25/2023 4:38 PM     [facility]

## 2023-10-25 NOTE — Progress Notes (Signed)
 Mobility Specialist Progress Note:    10/25/23 1210  Mobility  Activity Ambulated with assistance in hallway  Level of Assistance Standby assist, set-up cues, supervision of patient - no hands on  Assistive Device Other (Comment) (railings)  Distance Ambulated (ft) 180 ft  Range of Motion/Exercises Active;All extremities  Activity Response Tolerated well  Mobility Referral Yes  Mobility visit 1 Mobility  Mobility Specialist Start Time (ACUTE ONLY) 1210  Mobility Specialist Stop Time (ACUTE ONLY) 1230  Mobility Specialist Time Calculation (min) (ACUTE ONLY) 20 min   Pt received in chair, agreeable to mobility. Required SBA to stand and ambulate with no AD. Tolerated well,uses railings for support during ambulation. Returned pt to chair, call bell in reach. All need met.  Breckon Reeves Mobility Specialist Please contact via Special educational needs teacher or  Rehab office at [phone]

## 2023-10-25 NOTE — Progress Notes (Signed)
 PROGRESS NOTE  Denise Underwood, is a 78 y.o. female, DOB - [birth year], OEV:[id number]  Admit date - 10/22/2023   Admitting Physician Ejiroghene Debroah Fanning, MD  Outpatient Primary MD for the patient is [name], Denise Kindred, FNP  LOS - 3  Chief Complaint  Patient presents with   Leg Swelling      Brief Narrative:  78 y.o. female with medical history significant for diastolic CHF, atrial fibrillation, COPD, hypertension, CKD 3, psoriasis, chronic respiratory failure on as needed O2 admitted on 10/22/2023 with right leg cellulitis after failing antibiotics as outpatient    -Assessment and Plan: 1)Sepsis secondary to cellulitis of Right Lower Extremity--POA -Completed course of Keflex  and took Doxy for 2 days Prior to admission -Significant right leg erythema, swelling, warmth and tenderness-- -also has left leg erythema, swelling warmth and tenderness but less impressive compared to right ---Denies significant leg pains or pleuritic symptoms (on Eliquis  for Afib) 10/25/23 WBC 20.5 >>15.3>> 12.8>.14.4 Afebrile -Continue IV vancomycin  -Stopped aztreonam  -Started IV Rocephin  on 10/23/23 --Significant swelling and erythema of Both LE, Rt >> Lt.  -The patient states that outpatient venous Dopplers of the right lower extremity was negative for DVT,  report not available to me - Patient also PTA has been compliant with Eliquis  (for Afib)  2)Acute kidney injury superimposed on chronic kidney disease (HCC) AKI----acute kidney injury on CKD stage -3B --  creatinine on admission=2.09  ,  -baseline creatinine = 1.3 to 1.7 ,  creatinine is now=1.23 (improving), -- renally adjust medications, avoid nephrotoxic agents / dehydration  / hypotension -Hold lisinopril  - Hold Lasix   3)Chronic diastolic CHF (congestive heart failure) (HCC) Stable and compensated.  Last echo 01/2023 EF 60 to 65%, G1DD.  -Received IV fluids on admission per sepsis protocol - Hold Lasix   4)Chronic Atrial fibrillation  (HCC) -Continue Eliquis  for stroke prophylaxis, 10/25/23 --BP and heart rate has improved -Okay to stop short acting Cardizem  30 mg every 6 hours -Restart PTA Cardizem  CD 180 mg daily -Consider restarting metoprolol  at a lower dose of 12.5 mg twice daily on 10/26/2023 - Echo and LVEF as above #3 - Denies significant palpitations or dizziness -Ambulated in the hallways with mobility specialist--without significant dyspnea on exertion chest pains or significant dizziness while ambulating or post ambulation  5)HTN- -Lasix  and Lisinopril   on hold as above - BP and Cardizem  adjusted as Above in # 4  6)Psoriatic Skin Changes --Legs mostly----topical Clobetasol  oint as ordered   Status is: Inpatient   Disposition: The patient is from: Home              Anticipated d/c is to: Home              Anticipated d/c date is: 2 days              Patient currently is not medically stable to d/c. Barriers: Not Clinically Stable-   Code Status :  -  Code Status: Full Code   Family Communication:    NA (patient is alert, awake and coherent)  Daughters Tyra Galley and [place] are at bedside  DVT Prophylaxis  :   - SCDs/ apixaban  (ELIQUIS ) tablet 5 mg   Lab Results  Component Value Date   PLT 201 10/25/2023   Inpatient Medications  Scheduled Meds:  apixaban   5 mg Oral BID   clobetasol  ointment   Topical BID   diltiazem   30 mg Oral Q6H   escitalopram   20 mg Oral Daily   fluticasone  furoate-vilanterol  1 puff  Inhalation Daily   pantoprazole   40 mg Oral Daily   traZODone   50 mg Oral QHS   Continuous Infusions:  cefTRIAXone  (ROCEPHIN )  IV 2 g (10/25/23 1328)   vancomycin  Stopped (10/24/23 1807)   PRN Meds:.acetaminophen  **OR** acetaminophen , albuterol , HYDROcodone -acetaminophen , ondansetron  **OR** ondansetron  (ZOFRAN ) IV, polyethylene glycol   Anti-infectives (From admission, onward)    Start     Dose/Rate Route Frequency Ordered Stop   10/24/23 1400  vancomycin  (VANCOCIN ) IVPB 1000 mg/200 mL  premix        1,000 mg 200 mL/hr over 60 Minutes Intravenous Every 48 hours 10/22/23 1435     10/23/23 1400  cefTRIAXone  (ROCEPHIN ) 2 g in sodium chloride  0.9 % 100 mL IVPB        2 g 200 mL/hr over 30 Minutes Intravenous Every 24 hours 10/23/23 0948     10/23/23 0600  aztreonam  (AZACTAM ) 1 g in sodium chloride  0.9 % 100 mL IVPB  Status:  Discontinued        1 g 200 mL/hr over 30 Minutes Intravenous Every 8 hours 10/22/23 2116 10/23/23 0948   10/22/23 1445  vancomycin  (VANCOREADY) IVPB 1750 mg/350 mL        1,750 mg 175 mL/hr over 120 Minutes Intravenous  Once 10/22/23 1434 10/22/23 1721   10/22/23 1430  cefTRIAXone  (ROCEPHIN ) 2 g in sodium chloride  0.9 % 100 mL IVPB        2 g 200 mL/hr over 30 Minutes Intravenous Once 10/22/23 1421 10/22/23 1512      Subjective: Vertie Gosling today has no fevers, no emesis,  No chest pain,   - Denies significant palpitations or dizziness -Ambulated in the hallways with mobility specialist--without significant dyspnea on exertion chest pains or significant dizziness while ambulating or post ambulation Daughters [place] and [place] are at bedside - Objective: Vitals:   10/24/23 1944 10/25/23 0557 10/25/23 0835 10/25/23 1339  BP: (!) 114/59 (!) 124/49  (!) 137/55  Pulse: 90 85    Resp: 17 17  17   Temp: 98.3 F (36.8 C) 98.2 F (36.8 C)  98.4 F (36.9 C)  TempSrc: Oral   Oral  SpO2: 97% 94% 91% 97%  Weight:      Height:        Intake/Output Summary (Last 24 hours) at 10/25/2023 1457 Last data filed at 10/25/2023 1300 Gross per 24 hour  Intake 1380 ml  Output --  Net 1380 ml   Filed Weights   10/22/23 1337  Weight: 79.8 kg    Physical Exam Gen:- Awake Alert, in no acute distress HEENT:- [place].AT, No sclera icterus Neck-Supple Neck,No JVD,.  Lungs-  CTAB , fair symmetrical air movement CV- S1, S2 normal, irregular , tachy Abd-  +ve B.Sounds, Abd Soft, No tenderness,    Extremity/Skin:- +ve pitting edema, pedal pulses present   Psych-affect is appropriate, oriented x3 Neuro-generalized weakness, no new focal deficits, no tremors MSK--right leg swelling, erythema, warmth, tenderness--please see photos in epic - Erythematous macular lesions over left lower extremity--please see photos in epic  Data Reviewed: I have personally reviewed following labs and imaging studies  CBC: Recent Labs  Lab 10/22/23 1358 10/23/23 0428 10/24/23 0421 10/25/23 0253  WBC 20.5* 15.3* 12.8* 14.4*  NEUTROABS 12.2*  --   --   --   HGB 12.7 11.2* 10.9* 10.9*  HCT 40.7 35.5* 34.9* 33.7*  MCV 105.2* 102.9* 103.9* 103.4*  PLT 250 203 180 201   Basic Metabolic Panel: Recent Labs  Lab 10/22/23 1358 10/23/23 0428  10/24/23 0421 10/24/23 0422 10/25/23 0253  NA 142 141  --  144 138  K 4.6 4.3  --  4.5 4.2  CL 110 113*  --  113* 111  CO2 21* 21*  --  24 22  GLUCOSE 113* 95  --  108* 119*  BUN 64* 52*  --  42* 36*  CREATININE 2.09* 1.58* 1.20* 1.26* 1.23*  CALCIUM  8.8* 8.4*  --  8.3* 8.2*  PHOS  --   --   --  3.7  --    GFR: Estimated Creatinine Clearance: 38.5 mL/min (A) (by C-G formula based on SCr of 1.23 mg/dL (H)). Liver Function Tests: Recent Labs  Lab 10/22/23 1358 10/24/23 0422  AST 16  --   ALT 11  --   ALKPHOS 88  --   BILITOT 0.5  --   PROT 6.6  --   ALBUMIN 3.4* 2.3*   HbA1C: Recent Labs    10/23/23 0428  HGBA1C 5.2   Recent Results (from the past 240 hours)  Blood culture (routine x 2)     Status: None (Preliminary result)   Collection Time: 10/22/23  1:58 PM   Specimen: BLOOD  Result Value Ref Range Status   Specimen Description BLOOD BLOOD RIGHT ARM  Final   Special Requests   Final    Blood Culture results may not be optimal due to an inadequate volume of blood received in culture bottles BOTTLES DRAWN AEROBIC AND ANAEROBIC   Culture   Final    NO GROWTH 3 DAYS Performed at [facility], [address], [place], [place] [address]    Report Status PENDING  Incomplete  Blood culture (routine  x 2)     Status: None (Preliminary result)   Collection Time: 10/22/23  1:58 PM   Specimen: BLOOD  Result Value Ref Range Status   Specimen Description BLOOD BLOOD LEFT ARM  Final   Special Requests   Final    Blood Culture results may not be optimal due to an inadequate volume of blood received in culture bottles BOTTLES DRAWN AEROBIC AND ANAEROBIC   Culture   Final    NO GROWTH 3 DAYS Performed at [facility], [address], [place], [place] [address]    Report Status PENDING  Incomplete    Scheduled Meds:  apixaban   5 mg Oral BID   clobetasol  ointment   Topical BID   diltiazem   30 mg Oral Q6H   escitalopram   20 mg Oral Daily   fluticasone  furoate-vilanterol  1 puff Inhalation Daily   pantoprazole   40 mg Oral Daily   traZODone   50 mg Oral QHS   Continuous Infusions:  cefTRIAXone  (ROCEPHIN )  IV 2 g (10/25/23 1328)   vancomycin  Stopped (10/24/23 1807)    LOS: 3 days   Colin Dawley M.D on 10/25/2023 at 2:57 PM  Go to [email or web address] - for contact info  [facility] - Office  [phone]  If 7PM-7AM, please contact night-coverage [email or web address] 10/25/2023, 2:57 PM

## 2023-10-26 DIAGNOSIS — L03115 Cellulitis of right lower limb: Secondary | ICD-10-CM | POA: Diagnosis not present

## 2023-10-26 LAB — CBC
HCT: 31.8 % — ABNORMAL LOW (ref 36.0–46.0)
Hemoglobin: 9.7 g/dL — ABNORMAL LOW (ref 12.0–15.0)
MCH: 31.9 pg (ref 26.0–34.0)
MCHC: 30.5 g/dL (ref 30.0–36.0)
MCV: 104.6 fL — ABNORMAL HIGH (ref 80.0–100.0)
Platelets: 178 10*3/uL (ref 150–400)
RBC: 3.04 MIL/uL — ABNORMAL LOW (ref 3.87–5.11)
RDW: 12.9 % (ref 11.5–15.5)
WBC: 14.1 10*3/uL — ABNORMAL HIGH (ref 4.0–10.5)
nRBC: 0 % (ref 0.0–0.2)

## 2023-10-26 NOTE — Plan of Care (Signed)
   Problem: Education: Goal: Knowledge of General Education information will improve Description: Including pain rating scale, medication(s)/side effects and non-pharmacologic comfort measures Outcome: Progressing   Problem: Clinical Measurements: Goal: Will remain free from infection Outcome: Progressing   Problem: Activity: Goal: Risk for activity intolerance will decrease Outcome: Progressing

## 2023-10-26 NOTE — Progress Notes (Signed)
 PROGRESS NOTE  Denise Underwood, is a 78 y.o. female, DOB - 19-Oct-1945, UJW:[id number]  Admit date - 10/22/2023   Admitting Physician Ejiroghene Debroah Fanning, MD  Outpatient Primary MD for the patient is [name], [name], FNP  LOS - 4  Chief Complaint  Patient presents with   Leg Swelling      Brief Narrative:  78 y.o. female with medical history significant for diastolic CHF, atrial fibrillation, COPD, hypertension, CKD 3, psoriasis, chronic respiratory failure on as needed O2 admitted on 10/22/2023 with right leg cellulitis after failing antibiotics as outpatient    -Assessment and Plan: 1)Sepsis secondary to cellulitis of Right Lower Extremity--POA -Completed course of Keflex  and took Doxy for 2 days Prior to admission -Significant right leg erythema, swelling, warmth and tenderness-- -also has left leg erythema, swelling warmth and tenderness but less impressive compared to right ---Denies significant leg pains or pleuritic symptoms (on Eliquis  for Afib) 10/26/23 WBC 20.5 >>15.3>> 12.8>.14.4>>14.1 Afebrile -Continue IV vancomycin  -Stopped aztreonam  -Started IV Rocephin  on 10/23/23 -- Improving  swelling and erythema of Both LE, Rt >> Lt.  -The patient states that outpatient venous Dopplers of the right lower extremity was negative for DVT,  report not available to me - Patient also PTA has been compliant with Eliquis  (for Afib) - Check CRP and PCT in a.m.  2)Acute kidney injury superimposed on chronic kidney disease (HCC) AKI----acute kidney injury on CKD stage -3B --  creatinine on admission=2.09  ,  -baseline creatinine = 1.3 to 1.7 ,  creatinine is now=1.23 (improving), -- renally adjust medications, avoid nephrotoxic agents / dehydration  / hypotension -Hold lisinopril  - Hold Lasix   3)Chronic diastolic CHF (congestive heart failure) (HCC) Stable and compensated.  Last echo 01/2023 EF 60 to 65%, G1DD.  -Received IV fluids on admission per sepsis protocol - Hold  Lasix   4)Chronic Atrial fibrillation (HCC) -Continue Eliquis  for stroke prophylaxis, 10/26/23 --BP and heart rate has improved -c/n PTA Cardizem  CD 180 mg daily -restart metoprolol  at a lower dose of 12.5 mg twice daily on 10/26/2023 - Echo and LVEF as above #3 - Denies significant palpitations or dizziness -Ambulated in the hallways with mobility specialist--without significant dyspnea on exertion chest pains or significant dizziness while ambulating or post ambulation  5)HTN- -Lasix  and Lisinopril   on hold as above - BP and Cardizem  adjusted as Above in # 4  6)Psoriatic Skin Changes --Legs mostly----topical Clobetasol  oint as ordered   Status is: Inpatient   Disposition: The patient is from: Home              Anticipated d/c is to: Home              Anticipated d/c date is: 1 day              Patient currently is not medically stable to d/c. Barriers: Not Clinically Stable-   Code Status :  -  Code Status: Full Code   Family Communication:    NA (patient is alert, awake and coherent)  Daughters Tyra Galley and [place] are at bedside  DVT Prophylaxis  :   - SCDs/ apixaban  (ELIQUIS ) tablet 5 mg   Lab Results  Component Value Date   PLT 178 10/26/2023   Inpatient Medications  Scheduled Meds:  apixaban   5 mg Oral BID   clobetasol  ointment   Topical BID   diltiazem   180 mg Oral Daily   escitalopram   20 mg Oral Daily   fluticasone  furoate-vilanterol  1 puff Inhalation Daily  metoprolol  tartrate  12.5 mg Oral BID   pantoprazole   40 mg Oral Daily   traZODone   50 mg Oral QHS   Continuous Infusions:  cefTRIAXone  (ROCEPHIN )  IV 2 g (10/25/23 1328)   vancomycin  Stopped (10/24/23 1807)   PRN Meds:.acetaminophen  **OR** acetaminophen , albuterol , HYDROcodone -acetaminophen , ondansetron  **OR** ondansetron  (ZOFRAN ) IV, polyethylene glycol   Anti-infectives (From admission, onward)    Start     Dose/Rate Route Frequency Ordered Stop   10/24/23 1400  vancomycin  (VANCOCIN ) IVPB 1000  mg/200 mL premix        1,000 mg 200 mL/hr over 60 Minutes Intravenous Every 48 hours 10/22/23 1435     10/23/23 1400  cefTRIAXone  (ROCEPHIN ) 2 g in sodium chloride  0.9 % 100 mL IVPB        2 g 200 mL/hr over 30 Minutes Intravenous Every 24 hours 10/23/23 0948     10/23/23 0600  aztreonam  (AZACTAM ) 1 g in sodium chloride  0.9 % 100 mL IVPB  Status:  Discontinued        1 g 200 mL/hr over 30 Minutes Intravenous Every 8 hours 10/22/23 2116 10/23/23 0948   10/22/23 1445  vancomycin  (VANCOREADY) IVPB 1750 mg/350 mL        1,750 mg 175 mL/hr over 120 Minutes Intravenous  Once 10/22/23 1434 10/22/23 1721   10/22/23 1430  cefTRIAXone  (ROCEPHIN ) 2 g in sodium chloride  0.9 % 100 mL IVPB        2 g 200 mL/hr over 30 Minutes Intravenous Once 10/22/23 1421 10/22/23 1512      Subjective: Denise Underwood today has no fevers, no emesis,  No chest pain,    - -Leg swelling and discomfort improving --Rate control improving, no dizziness -  Objective: Vitals:   10/25/23 1339 10/25/23 1935 10/26/23 0354 10/26/23 0849  BP: (!) 137/55 (!) 124/55 (!) 125/43   Pulse:  88 77   Resp: 17 20 20    Temp: 98.4 F (36.9 C) 99.3 F (37.4 C) 98.9 F (37.2 C)   TempSrc: Oral Oral Oral   SpO2: 97% 94% 92% 92%  Weight:      Height:        Intake/Output Summary (Last 24 hours) at 10/26/2023 1013 Last data filed at 10/25/2023 1300 Gross per 24 hour  Intake 300 ml  Output --  Net 300 ml   Filed Weights   10/22/23 1337  Weight: 79.8 kg    Physical Exam Gen:- Awake Alert, in no acute distress HEENT:- [place].AT, No sclera icterus Neck-Supple Neck,No JVD,.  Lungs-  CTAB , fair symmetrical air movement CV- S1, S2 normal, irregular , tachy Abd-  +ve B.Sounds, Abd Soft, No tenderness,    Extremity/Skin:- +ve pitting edema, pedal pulses present  Psych-affect is appropriate, oriented x3 Neuro-generalized weakness, no new focal deficits, no tremors MSK--improving right leg swelling, improving erythema, improving  warmth, improving tenderness--please see photos in epic - Erythematous macular lesions over left lower extremity--please see photos in epic  Data Reviewed: I have personally reviewed following labs and imaging studies  CBC: Recent Labs  Lab 10/22/23 1358 10/23/23 0428 10/24/23 0421 10/25/23 0253 10/26/23 0242  WBC 20.5* 15.3* 12.8* 14.4* 14.1*  NEUTROABS 12.2*  --   --   --   --   HGB 12.7 11.2* 10.9* 10.9* 9.7*  HCT 40.7 35.5* 34.9* 33.7* 31.8*  MCV 105.2* 102.9* 103.9* 103.4* 104.6*  PLT 250 203 180 201 178   Basic Metabolic Panel: Recent Labs  Lab 10/22/23 1358 10/23/23 0428 10/24/23 0421  10/24/23 0422 10/25/23 0253  NA 142 141  --  144 138  K 4.6 4.3  --  4.5 4.2  CL 110 113*  --  113* 111  CO2 21* 21*  --  24 22  GLUCOSE 113* 95  --  108* 119*  BUN 64* 52*  --  42* 36*  CREATININE 2.09* 1.58* 1.20* 1.26* 1.23*  CALCIUM  8.8* 8.4*  --  8.3* 8.2*  PHOS  --   --   --  3.7  --    GFR: Estimated Creatinine Clearance: 38.5 mL/min (A) (by C-G formula based on SCr of 1.23 mg/dL (H)). Liver Function Tests: Recent Labs  Lab 10/22/23 1358 10/24/23 0422  AST 16  --   ALT 11  --   ALKPHOS 88  --   BILITOT 0.5  --   PROT 6.6  --   ALBUMIN 3.4* 2.3*   HbA1C: No results for input(s): HGBA1C in the last 72 hours.  Recent Results (from the past 240 hours)  Blood culture (routine x 2)     Status: None (Preliminary result)   Collection Time: 10/22/23  1:58 PM   Specimen: BLOOD  Result Value Ref Range Status   Specimen Description BLOOD BLOOD RIGHT ARM  Final   Special Requests   Final    Blood Culture results may not be optimal due to an inadequate volume of blood received in culture bottles BOTTLES DRAWN AEROBIC AND ANAEROBIC   Culture   Final    NO GROWTH 3 DAYS Performed at [facility], [address], [place], [place] [address]    Report Status PENDING  Incomplete  Blood culture (routine x 2)     Status: None (Preliminary result)   Collection Time: 10/22/23   1:58 PM   Specimen: BLOOD  Result Value Ref Range Status   Specimen Description BLOOD BLOOD LEFT ARM  Final   Special Requests   Final    Blood Culture results may not be optimal due to an inadequate volume of blood received in culture bottles BOTTLES DRAWN AEROBIC AND ANAEROBIC   Culture   Final    NO GROWTH 3 DAYS Performed at [facility], [address], [place], [place] [address]    Report Status PENDING  Incomplete    Scheduled Meds:  apixaban   5 mg Oral BID   clobetasol  ointment   Topical BID   diltiazem   180 mg Oral Daily   escitalopram   20 mg Oral Daily   fluticasone  furoate-vilanterol  1 puff Inhalation Daily   metoprolol  tartrate  12.5 mg Oral BID   pantoprazole   40 mg Oral Daily   traZODone   50 mg Oral QHS   Continuous Infusions:  cefTRIAXone  (ROCEPHIN )  IV 2 g (10/25/23 1328)   vancomycin  Stopped (10/24/23 1807)    LOS: 4 days   Colin Dawley M.D on 10/26/2023 at 10:13 AM  Go to [email or web address] - for contact info  [facility] - Office  [phone]  If 7PM-7AM, please contact night-coverage [email or web address] 10/26/2023, 10:13 AM

## 2023-10-27 LAB — BASIC METABOLIC PANEL WITH GFR
Anion gap: 7 (ref 5–15)
BUN: 28 mg/dL — ABNORMAL HIGH (ref 8–23)
CO2: 23 mmol/L (ref 22–32)
Calcium: 8.6 mg/dL — ABNORMAL LOW (ref 8.9–10.3)
Chloride: 113 mmol/L — ABNORMAL HIGH (ref 98–111)
Creatinine, Ser: 1.01 mg/dL — ABNORMAL HIGH (ref 0.44–1.00)
GFR, Estimated: 57 mL/min — ABNORMAL LOW (ref >60)
Glucose, Bld: 106 mg/dL — ABNORMAL HIGH (ref 70–99)
Potassium: 4.2 mmol/L (ref 3.5–5.1)
Sodium: 143 mmol/L (ref 135–145)

## 2023-10-27 LAB — CULTURE, BLOOD (ROUTINE X 2)
Culture: NO GROWTH
Culture: NO GROWTH

## 2023-10-27 LAB — CBC
HCT: 32.2 % — ABNORMAL LOW (ref 36.0–46.0)
Hemoglobin: 10.4 g/dL — ABNORMAL LOW (ref 12.0–15.0)
MCH: 33.8 pg (ref 26.0–34.0)
MCHC: 32.3 g/dL (ref 30.0–36.0)
MCV: 104.5 fL — ABNORMAL HIGH (ref 80.0–100.0)
Platelets: 196 10*3/uL (ref 150–400)
RBC: 3.08 MIL/uL — ABNORMAL LOW (ref 3.87–5.11)
RDW: 13 % (ref 11.5–15.5)
WBC: 16.9 10*3/uL — ABNORMAL HIGH (ref 4.0–10.5)
nRBC: 0 % (ref 0.0–0.2)

## 2023-10-27 LAB — C-REACTIVE PROTEIN: CRP: 1.6 mg/dL — ABNORMAL HIGH (ref <1.0)

## 2023-10-27 LAB — PROCALCITONIN: Procalcitonin: <0.1 ng/mL

## 2023-10-27 NOTE — Plan of Care (Signed)
   Problem: Education: Goal: Knowledge of General Education information will improve Description Including pain rating scale, medication(s)/side effects and non-pharmacologic comfort measures Outcome: Progressing   Problem: Health Behavior/Discharge Planning: Goal: Ability to manage health-related needs will improve Outcome: Progressing

## 2023-10-27 NOTE — Plan of Care (Signed)
   Problem: Education: Goal: Knowledge of General Education information will improve Description Including pain rating scale, medication(s)/side effects and non-pharmacologic comfort measures Outcome: Progressing   Problem: Clinical Measurements: Goal: Ability to maintain clinical measurements within normal limits will improve Outcome: Progressing   Problem: Clinical Measurements: Goal: Will remain free from infection Outcome: Progressing

## 2023-10-27 NOTE — Progress Notes (Addendum)
 PROGRESS NOTE  Denise Underwood, is a 78 y.o. female, DOB - 19-Jul-1945, FMW:[id number]  Admit date - 10/22/2023   Admitting Physician Ejiroghene FORBES Carwin, MD  Outpatient Primary MD for the patient is [name], Rock HERO, FNP  LOS - 5  Chief Complaint  Patient presents with   Leg Swelling      Brief Narrative:  78 y.o. female with medical history significant for diastolic CHF, atrial fibrillation, COPD, hypertension, CKD 3, psoriasis, chronic respiratory failure on as needed O2 admitted on 10/22/2023 with right leg cellulitis after failing antibiotics as outpatient    -Assessment and Plan: 1)Sepsis secondary to cellulitis of Right Lower Extremity--POA -Treated with  Keflex  and Doxy for 2 days Prior to admission -Much improved right leg erythema, swelling, warmth and tenderness-- -Much improved has left leg erythema, swelling warmth and tenderness  ---Denies significant leg pains or pleuritic symptoms (on Eliquis  for Afib) WBC 20.5 >>15.3>> 12.8>.14.4>>14.1>>16.9 Afebrile - Treated with IV vancomycin  -Stopped aztreonam  -Started  IV Rocephin  on 10/23/23 -- Much improved  swelling and erythema of Both LE, Rt >> Lt.  -The patient states that outpatient venous Dopplers of the right lower extremity was negative for DVT,  report not available to me - Patient also PTA has been compliant with Eliquis  (for Afib) - PCT <0.10 CRP 1.6 -- Patient states that she has always had persistent leukocytosis for many many years without significant explanation, initially she was told that her chronic leukocytosis was because of smoking/tobacco use but she has not smoked for years now and she has continued to have persistent leukocytosis --- Elevated WBC noted however patient has no fevers leg cellulitis has improved significantly clinically--- PCT is not elevated, CRP 1.6 - Discussed with ID provider and ID pharmacist-----given significant clinical improvement and history of prior persistent chronic leukocytosis  okay to discharge on oral antibiotics in am if remains clinically stable -- Patient has psoriasis  (psoriatic lesions) which are contributing to leg erythema -- Blood cultures requested in a.m. for completeness of workup  2)Acute kidney injury superimposed on chronic kidney disease (HCC) AKI----acute kidney injury on CKD stage -3B --  creatinine on admission=2.09  ,  -baseline creatinine = 1.3 to 1.7 ,  creatinine has normalized and is now 1.0 -- renally adjust medications, avoid nephrotoxic agents / dehydration  / hypotension oh progress note 16  3)Chronic diastolic CHF (congestive heart failure) (HCC) Stable and compensated.  Last echo 01/2023 EF 60 to 65%, G1DD.  -Received IV fluids on admission per sepsis protocol - Hold Lasix   4)Chronic Atrial fibrillation (HCC) -Continue Eliquis  for stroke prophylaxis, --BP and heart rate has improved -c/n PTA Cardizem  CD 180 mg daily -restart metoprolol  at a lower dose of 12.5 mg twice daily on 10/26/2023 - Echo and LVEF as above #3 - Denies significant palpitations or dizziness -Ambulated in the hallways with mobility specialist--without significant dyspnea on exertion chest pains or significant dizziness while ambulating or post ambulation  5)HTN- -Lasix  and Lisinopril   on hold as above - BP and Cardizem  adjusted as Above in # 4  6)Psoriatic Skin Changes --Legs mostly----topical Clobetasol  oint as ordered   Status is: Inpatient   Disposition: The patient is from: Home              Anticipated d/c is to: Home              Anticipated d/c date is: 1 day              Patient currently is  not medically stable to d/c. Barriers: Not Clinically Stable-   Code Status :  -  Code Status: Full Code   Family Communication:    NA (patient is alert, awake and coherent)  Discussed with Daughters Karna and Charlott are at bedside  DVT Prophylaxis  :   - SCDs/ apixaban  (ELIQUIS ) tablet 5 mg   Lab Results  Component Value Date   PLT 196 10/27/2023    Inpatient Medications  Scheduled Meds:  apixaban   5 mg Oral BID   clobetasol  ointment   Topical BID   diltiazem   180 mg Oral Daily   escitalopram   20 mg Oral Daily   fluticasone  furoate-vilanterol  1 puff Inhalation Daily   metoprolol  tartrate  12.5 mg Oral BID   pantoprazole   40 mg Oral Daily   traZODone   50 mg Oral QHS   Continuous Infusions:  cefTRIAXone  (ROCEPHIN )  IV 2 g (10/26/23 1353)   vancomycin  1,000 mg (10/26/23 1444)   PRN Meds:.acetaminophen  **OR** acetaminophen , albuterol , HYDROcodone -acetaminophen , ondansetron  **OR** ondansetron  (ZOFRAN ) IV, polyethylene glycol   Anti-infectives (From admission, onward)    Start     Dose/Rate Route Frequency Ordered Stop   10/24/23 1400  vancomycin  (VANCOCIN ) IVPB 1000 mg/200 mL premix        1,000 mg 200 mL/hr over 60 Minutes Intravenous Every 48 hours 10/22/23 1435     10/23/23 1400  cefTRIAXone  (ROCEPHIN ) 2 g in sodium chloride  0.9 % 100 mL IVPB        2 g 200 mL/hr over 30 Minutes Intravenous Every 24 hours 10/23/23 0948     10/23/23 0600  aztreonam  (AZACTAM ) 1 g in sodium chloride  0.9 % 100 mL IVPB  Status:  Discontinued        1 g 200 mL/hr over 30 Minutes Intravenous Every 8 hours 10/22/23 2116 10/23/23 0948   10/22/23 1445  vancomycin  (VANCOREADY) IVPB 1750 mg/350 mL        1,750 mg 175 mL/hr over 120 Minutes Intravenous  Once 10/22/23 1434 10/22/23 1721   10/22/23 1430  cefTRIAXone  (ROCEPHIN ) 2 g in sodium chloride  0.9 % 100 mL IVPB        2 g 200 mL/hr over 30 Minutes Intravenous Once 10/22/23 1421 10/22/23 1512      Subjective: Denise Underwood today has no fevers, no emesis,  No chest pain,    - -Leg swelling and discomfort improving --Rate control improving, no dizziness ---  Objective: Vitals:   10/26/23 2022 10/27/23 0659 10/27/23 0800 10/27/23 1241  BP:  129/69 125/62 (!) 119/56  Pulse:  61 71 78  Resp: 17   20  Temp:  98.3 F (36.8 C) 98.8 F (37.1 C) 98 F (36.7 C)  TempSrc:  Oral Oral Oral   SpO2:  96% 94% 97%  Weight:      Height:        Intake/Output Summary (Last 24 hours) at 10/27/2023 1639 Last data filed at 10/26/2023 1700 Gross per 24 hour  Intake 240 ml  Output --  Net 240 ml   Filed Weights   10/22/23 1337  Weight: 79.8 kg    Physical Exam Gen:- Awake Alert, in no acute distress HEENT:- [place].AT, No sclera icterus Neck-Supple Neck,No JVD,.  Lungs-  CTAB , fair symmetrical air movement CV- S1, S2 normal, irregular , tachy Abd-  +ve B.Sounds, Abd Soft, No tenderness,    Extremity/Skin:-Much improved pitting edema, pedal pulses present  Psych-affect is appropriate, oriented x3 Neuro-generalized weakness, no new focal deficits, no  tremors MSK--much improved right leg swelling, improving erythema, much improved warmth, mostly resolved tenderness--please see photos in epic - Much improved erythematous macular lesions over left lower extremity--please see photos in epic  Data Reviewed: I have personally reviewed following labs and imaging studies  CBC: Recent Labs  Lab 10/22/23 1358 10/23/23 0428 10/24/23 0421 10/25/23 0253 10/26/23 0242 10/27/23 0339  WBC 20.5* 15.3* 12.8* 14.4* 14.1* 16.9*  NEUTROABS 12.2*  --   --   --   --   --   HGB 12.7 11.2* 10.9* 10.9* 9.7* 10.4*  HCT 40.7 35.5* 34.9* 33.7* 31.8* 32.2*  MCV 105.2* 102.9* 103.9* 103.4* 104.6* 104.5*  PLT 250 203 180 201 178 196   Basic Metabolic Panel: Recent Labs  Lab 10/22/23 1358 10/23/23 0428 10/24/23 0421 10/24/23 0422 10/25/23 0253 10/27/23 0339  NA 142 141  --  144 138 143  K 4.6 4.3  --  4.5 4.2 4.2  CL 110 113*  --  113* 111 113*  CO2 21* 21*  --  24 22 23   GLUCOSE 113* 95  --  108* 119* 106*  BUN 64* 52*  --  42* 36* 28*  CREATININE 2.09* 1.58* 1.20* 1.26* 1.23* 1.01*  CALCIUM  8.8* 8.4*  --  8.3* 8.2* 8.6*  PHOS  --   --   --  3.7  --   --    GFR: Estimated Creatinine Clearance: 46.9 mL/min (A) (by C-G formula based on SCr of 1.01 mg/dL (H)). Liver Function  Tests: Recent Labs  Lab 10/22/23 1358 10/24/23 0422  AST 16  --   ALT 11  --   ALKPHOS 88  --   BILITOT 0.5  --   PROT 6.6  --   ALBUMIN 3.4* 2.3*   HbA1C: 5.2  Recent Results (from the past 240 hours)  Blood culture (routine x 2)     Status: None   Collection Time: 10/22/23  1:58 PM   Specimen: BLOOD  Result Value Ref Range Status   Specimen Description BLOOD BLOOD RIGHT ARM  Final   Special Requests   Final    Blood Culture results may not be optimal due to an inadequate volume of blood received in culture bottles BOTTLES DRAWN AEROBIC AND ANAEROBIC   Culture   Final    NO GROWTH 5 DAYS Performed at [facility], [address] [place], [place] [address]    Report Status 10/27/2023 FINAL  Final  Blood culture (routine x 2)     Status: None   Collection Time: 10/22/23  1:58 PM   Specimen: BLOOD  Result Value Ref Range Status   Specimen Description BLOOD BLOOD LEFT ARM  Final   Special Requests   Final    Blood Culture results may not be optimal due to an inadequate volume of blood received in culture bottles BOTTLES DRAWN AEROBIC AND ANAEROBIC   Culture   Final    NO GROWTH 5 DAYS Performed at [facility], [address] [place], [place] [address]    Report Status 10/27/2023 FINAL  Final    Scheduled Meds:  apixaban   5 mg Oral BID   clobetasol  ointment   Topical BID   diltiazem   180 mg Oral Daily   escitalopram   20 mg Oral Daily   fluticasone  furoate-vilanterol  1 puff Inhalation Daily   metoprolol  tartrate  12.5 mg Oral BID   pantoprazole   40 mg Oral Daily   traZODone   50 mg Oral QHS   Continuous Infusions:  cefTRIAXone  (ROCEPHIN )  IV 2 g (10/26/23 1353)   vancomycin  1,000 mg (10/26/23 1444)    LOS: 5 days   Rendall Carwin M.D on 10/27/2023 at 4:39 PM  Go to [email or web address] - for contact info  [facility] - Office  [phone]  If 7PM-7AM, please contact night-coverage [email or web address] 10/27/2023, 4:39 PM

## 2023-10-28 ENCOUNTER — Other Ambulatory Visit: Payer: Self-pay | Admitting: Family Medicine

## 2023-10-28 LAB — CBC WITH DIFFERENTIAL/PLATELET
Abs Immature Granulocytes: 0.4 10*3/uL — ABNORMAL HIGH (ref 0.00–0.07)
Basophils Absolute: 0.1 10*3/uL (ref 0.0–0.1)
Basophils Relative: 0 %
Eosinophils Absolute: 2 10*3/uL — ABNORMAL HIGH (ref 0.0–0.5)
Eosinophils Relative: 11 %
HCT: 35.2 % — ABNORMAL LOW (ref 36.0–46.0)
Hemoglobin: 11.3 g/dL — ABNORMAL LOW (ref 12.0–15.0)
Immature Granulocytes: 2 %
Lymphocytes Relative: 22 %
Lymphs Abs: 4.1 10*3/uL — ABNORMAL HIGH (ref 0.7–4.0)
MCH: 33.7 pg (ref 26.0–34.0)
MCHC: 32.1 g/dL (ref 30.0–36.0)
MCV: 105.1 fL — ABNORMAL HIGH (ref 80.0–100.0)
Monocytes Absolute: 0.6 10*3/uL (ref 0.1–1.0)
Monocytes Relative: 3 %
Neutro Abs: 11.5 10*3/uL — ABNORMAL HIGH (ref 1.7–7.7)
Neutrophils Relative %: 62 %
Platelets: 225 10*3/uL (ref 150–400)
RBC: 3.35 MIL/uL — ABNORMAL LOW (ref 3.87–5.11)
RDW: 13.1 % (ref 11.5–15.5)
WBC: 18.7 10*3/uL — ABNORMAL HIGH (ref 4.0–10.5)
nRBC: 0 % (ref 0.0–0.2)

## 2023-10-28 MED ORDER — FUROSEMIDE 20 MG PO TABS: 20.0000 mg | ORAL_TABLET | Freq: Every day | ORAL | 1 refills | Status: AC | PRN

## 2023-10-28 MED ORDER — APIXABAN 5 MG PO TABS: 5.0000 mg | ORAL_TABLET | Freq: Two times a day (BID) | ORAL | 1 refills | Status: AC

## 2023-10-28 MED ORDER — DOXYCYCLINE HYCLATE 100 MG PO TABS: 100.0000 mg | ORAL_TABLET | Freq: Two times a day (BID) | ORAL | 0 refills | Status: AC

## 2023-10-28 MED ORDER — CEPHALEXIN 500 MG PO CAPS: 500.0000 mg | ORAL_CAPSULE | Freq: Three times a day (TID) | ORAL | 0 refills | Status: AC

## 2023-10-28 MED ORDER — ALBUTEROL SULFATE HFA 108 (90 BASE) MCG/ACT IN AERS
2.0000 | INHALATION_SPRAY | Freq: Four times a day (QID) | RESPIRATORY_TRACT | 4 refills | Status: DC | PRN
Start: 2023-10-28 — End: 2024-04-02

## 2023-10-28 NOTE — Discharge Instructions (Signed)
 1)Avoid ibuprofen/Advil/Aleve/Motrin/Goody Powders/Naproxen/BC powders/Meloxicam /Diclofenac /Indomethacin and other Nonsteroidal anti-inflammatory medications as these will make you more likely to bleed and can cause stomach ulcers, can also cause Kidney problems.   2)Repeat CBC and BMP Blood Tests with Primary Care Physician in 1 week   3)Watch for bleeding while on Blood Thinners--watch for blood in your stool which can make your stool black, maroon, mahogany or red---, blood in your urine which can make your urine pink or red, nosebleeds , also watch for possible bruising  -You are taking Apixaban /Eliquis --- which is a blood thinner--- be careful to avoid injury or falls  4)Please follow-up with hematologist/oncologist Dr. Paulett Boros, MD--for evaluation of your persistently high white blood count -Address: inside [facility] (4th Floor), [address] [place], [place], [place] [address] Phone: [phone]

## 2023-10-28 NOTE — Care Management Important Message (Signed)
 Important Message  Patient Details  Name: Denise Underwood MRN: [id number] Date of Birth: February 11, 1946   Important Message Given:  Yes - Medicare IM     Denise Underwood Denise Underwood 10/28/2023, 10:03 AM

## 2023-10-28 NOTE — Plan of Care (Signed)
  Problem: Activity: Goal: Risk for activity intolerance will decrease Outcome: Progressing   Problem: Pain Managment: Goal: General experience of comfort will improve and/or be controlled Outcome: Progressing   Problem: Safety: Goal: Ability to remain free from injury will improve Outcome: Progressing   Problem: Clinical Measurements: Goal: Ability to avoid or minimize complications of infection will improve Outcome: Progressing   Problem: Skin Integrity: Goal: Skin integrity will improve Outcome: Progressing

## 2023-10-29 ENCOUNTER — Ambulatory Visit: Attending: Student | Admitting: Student

## 2023-10-29 ENCOUNTER — Telehealth: Payer: Self-pay | Admitting: *Deleted

## 2023-10-29 NOTE — Transitions of Care (Post Inpatient/ED Visit) (Signed)
 10/29/2023  Name: Denise Underwood MRN: [id number] DOB: 1946-04-03  Today's TOC FU Call Status: Today's TOC FU Call Status:: Successful TOC FU Call Completed TOC FU Call Complete Date: 10/29/23 Patient's Name and Date of Birth confirmed.  Transition Care Management Follow-up Telephone Call Date of Discharge: 10/28/23 Discharge [facility] Type of Discharge: Inpatient Admission Primary Inpatient Discharge Diagnosis:: Cellulitus of the rt lower extremity How have you been since you were released from the hospital?: Better Any questions or concerns?: No  Items Reviewed: Did you receive and understand the discharge instructions provided?: Yes Medications obtained,verified, and reconciled?: Yes (Medications Reviewed) Any new allergies since your discharge?: No Dietary orders reviewed?: No Do you have support at home?: Yes People in Home [RPT]: alone Name of Support/Comfort Primary Source: Denise Underwood and Denise Underwood and a caregiver 5x week  Medications Reviewed Today: Medications Reviewed Today     Reviewed by Eilene Grater, RN (Case Manager) on 10/29/23 at 1602  Med List Status: <None>   Medication Order Taking? Sig Documenting Provider Last Dose Status Informant  acetaminophen  (TYLENOL ) 650 MG CR tablet [id number] Yes Take 650 mg by mouth every 8 (eight) hours as needed for pain. [provider]  Active Child, Self, Pharmacy Records  albuterol  (VENTOLIN  HFA) 108 [phone] Base) MCG/ACT inhaler [id number] Yes Inhale 2 puffs into the lungs every 6 (six) hours as needed for wheezing or shortness of breath. Colin Dawley, MD  Active   apixaban  (ELIQUIS ) 5 MG TABS tablet [id number] Yes Take 1 tablet (5 mg total) by mouth 2 (two) times daily. Colin Dawley, MD  Active   CALCIUM  PO [id number] Yes Take 1 tablet by mouth daily. [provider]  Active Child, Self, Pharmacy Records  cephALEXin  (KEFLEX ) 500 MG capsule [id number] Yes Take 1 capsule (500 mg total) by mouth 3  (three) times daily for 5 days. Colin Dawley, MD  Active   Cholecalciferol (VITAMIN D3) 1000 units CAPS [id number] Yes Take 2,000 Units by mouth. [provider]  Active Child, Self, Pharmacy Records           Med Note Micah Ade   Tue Feb 04, 2023  9:56 AM)    diltiazem  (CARTIA  XT) 180 MG 24 hr capsule [id number] Yes take 1 capsule (180 milligram total) by mouth daily. Galvin Jules, FNP  Active Child, Self, Pharmacy Records  doxycycline  (VIBRA -TABS) 100 MG tablet [id number] Yes Take 1 tablet (100 mg total) by mouth 2 (two) times daily for 5 days. Colin Dawley, MD  Active   Emollient [facility] DIABETICS DRY SKIN) CREA [id number] Yes Apply 1 Application topically 2 (two) times daily. Galvin Jules, FNP  Active Child, Self, Pharmacy Records  escitalopram  (LEXAPRO ) 20 MG tablet [id number] Yes Take 1 tablet (20 mg total) by mouth daily. Galvin Jules, FNP  Active Child, Self, Pharmacy Records  fluconazole  (DIFLUCAN ) 200 MG tablet [id number] Yes Take 1 tablet (200 mg total) by mouth once a week for 4 doses. Galvin Jules, FNP  Active Child, Self, Pharmacy Records  fluticasone -salmeterol (ADVAIR) 250-50 MCG/ACT AEPB [id number] Yes inhale 1 puff into the lungs in the morning and at bedtime. Galvin Jules, FNP  Active Child, Self, Pharmacy Records  furosemide  (LASIX ) 20 MG tablet [id number] Yes Take 1 tablet (20 mg total) by mouth daily as needed. For Fluid Colin Dawley, MD  Active   HYDROcodone -acetaminophen  (NORCO/VICODIN) 5-325 MG tablet [id number] Yes Take 1 tablet by mouth every 6 (six) hours as needed for moderate pain (  pain score 4-6). [provider]  Active Child, Self, Pharmacy Records  metoprolol  tartrate (LOPRESSOR ) 25 MG tablet [id number] Yes Take 1 tablet (25 mg total) by mouth 2 (two) times daily. Galvin Jules, FNP  Active Child, Self, Pharmacy Records  nystatin  (MYCOSTATIN /NYSTOP ) powder [id number] Yes Apply 1 Application topically 3 (three) times daily. Galvin Jules, FNP  Active Child, Self, Pharmacy Records  pantoprazole  (PROTONIX ) 40 MG tablet [id number] Yes take 1 tablet (40 milligram total) by mouth daily. Galvin Jules, FNP  Active   traZODone  (DESYREL ) 50 MG tablet [id number] Yes Take 1 tablet (50 mg total) by mouth at bedtime. Galvin Jules, FNP  Active Child, Self, Pharmacy Records  triamcinolone  cream (KENALOG ) 0.1 % [id number] Yes Apply 1 Application topically 2 (two) times daily. Galvin Jules, FNP  Active Child, Self, Pharmacy Records  Med List Note (Ward, Angelica, CPhT 10/22/23 1502): Eliquis  is at [facility]            Home Care and Equipment/Supplies: Were Home Health Services Ordered?: NA Any new equipment or medical supplies ordered?: NA  Functional Questionnaire: Do you need assistance with bathing/showering or dressing?: Yes Do you need assistance with meal preparation?: Yes Do you need assistance with eating?: No Do you have difficulty maintaining continence: No Do you need assistance with getting out of bed/getting out of a chair/moving?: Yes (uses a walker) Do you have difficulty managing or taking your medications?: No  Follow up appointments reviewed: PCP Follow-up appointment confirmed?: Yes Date of PCP follow-up appointment?: 11/10/23 Follow-up Provider: [facility] Follow-up appointment confirmed?: No Reason Specialist Follow-Up Not Confirmed: Patient has Specialist Provider Number and will Call for Appointment (Patient was scheduled today , but stated she didn't make it she was feeling to weak) Do you need transportation to your follow-up appointment?: No Do you understand care options if your condition(s) worsen?: Yes-patient verbalized understanding  SDOH Interventions Today    Flowsheet Row Most Recent Value  SDOH Interventions   Food Insecurity Interventions Intervention Not Indicated  Housing Interventions Intervention Not Indicated  Transportation Interventions Intervention Not  Indicated, Patient Resources (Friends/Family)  Utilities Interventions Intervention Not Indicated  Depression Interventions/Treatment  Medication, Counseling    Goals Addressed             This Visit's Progress    VBCI Transitions of Care (TOC) Care Plan       Problems:  Recent Hospitalization for treatment of  cellulitus of rt lower extremity Knowledge Deficit Related to cellulitus of rt lower extremity  Goal:  Over the next 30 days, the patient will not experience hospital readmission  Interventions:  Transitions of Care: Doctor Visits  - discussed the importance of doctor visits  Patient Self Care Activities:  Attend all scheduled provider appointments Call pharmacy for medication refills 3-7 days in advance of running out of medications Call provider office for new concerns or questions  Notify RN Care Manager of [facility] call rescheduling needs Participate in Transition of Care Program/Attend TOC scheduled calls Take medications as prescribed    Plan:  An initial telephone outreach has been scheduled for: [phone] Next PCP appointment scheduled for: [phone] Telephone follow up appointment with care management team member scheduled for: [phone] Cecilie Coffee        Patient consented to transition of care outreach calls. RN discussed with patient to call the pharmacy for refills. Patient has a caregiver come 5 x a week in the morning  Una Ganser BSN RN [facility] Population Health Care Management Coordinator [email or web address] Direct Dial: [phone]  Fax: [phone] Website: [email or web address]

## 2023-11-02 LAB — CULTURE, BLOOD (ROUTINE X 2)
Culture: NO GROWTH
Culture: NO GROWTH

## 2023-11-03 NOTE — Discharge Summary (Signed)
 Denise Underwood, is a 78 y.o. female  DOB 11-Jan-1946  MRN [id number].  Admission date:  10/22/2023  Admitting Physician  Denise FORBES Carwin, MD  Discharge Date:  11/03/2023   Primary MD  Denise Rock HERO, FNP  Recommendations for primary care physician for things to follow:  1)Avoid ibuprofen/Advil/Aleve/Motrin/Goody Powders/Naproxen/BC powders/Meloxicam /Diclofenac /Indomethacin and other Nonsteroidal anti-inflammatory medications as these will make you more likely to bleed and can cause stomach ulcers, can also cause Kidney problems.   2)Repeat CBC and BMP Blood Tests with Primary Care Physician in 1 week   3)Watch for bleeding while on Blood Thinners--watch for blood in your stool which can make your stool black, maroon, mahogany or red---, blood in your urine which can make your urine pink or red, nosebleeds , also watch for possible bruising  -You are taking Apixaban /Eliquis --- which is a blood thinner--- be careful to avoid injury or falls  4)Please follow-up with hematologist/oncologist Dr. Alean Stands, MD--for evaluation of your persistently high white blood count -Address: inside [facility] (4th Floor), [address] [place], [place], [place] [address] Phone: [phone]  Admission Diagnosis  Cellulitis of right lower extremity [L03.115]   Discharge Diagnosis  Cellulitis of right lower extremity [L03.115]    Principal Problem:   Cellulitis of right lower extremity Active Problems:   Severe sepsis (HCC)   Acute kidney injury superimposed on chronic kidney disease (HCC)   Primary hypertension   Atrial fibrillation (HCC)   Chronic diastolic CHF (congestive heart failure) (HCC)   CKD stage 3b, GFR 30-44 ml/min (HCC)      Past Medical History:  Diagnosis Date   Anxiety    Arthritis    Hyperlipidemia    Hypertension    Osteopenia 07/04/2021   Prediabetes 07/09/2021   Psoriasis     Past  Surgical History:  Procedure Laterality Date   ABDOMINAL HYSTERECTOMY     CHOLECYSTECTOMY     HERNIA REPAIR     KNEE SURGERY     TUBAL LIGATION       HPI  from the history and physical done on the day of admission:     Chief Complaint: Right lower extremity cellulitis and pain    HPI: Denise Underwood is a 78 y.o. female with medical history significant for diastolic CHF, atrial fibrillation, COPD, hypertension, CKD 3, psoriasis, chronic respiratory failure on as needed O2.   Patient presented to the ED with complaints of swelling to the right lower extremity that started about 2 weeks ago.  She was started on a course of Keflex  which she took for about a week, as symptoms were not improving and she went back to her outpatient provider and was started on a course of doxycycline  6/9, which she took for 2 days.  She also had an ultrasound done which was negative for DVT.  She has been compliant with her Eliquis . She reports redness started with discoloration to the posterior leg and spread upwards to her thigh.  She started having similar patchy redness to her posterior  left leg, that started several days after onset on the right.  This is now spreading upwards towards her left groin.  She does not know if this is related to the antibiotics.   ED Course: Pertinent 7.5.  Heart rate 56-75, respirate rate 18-29, blood pressure systolic 90-135.  O2 sats greater than 92% on room air. Leukocytosis of 20.5.  Elevated creatinine 2.09. Lactic acid 1.5. Chest x-ray clear. IV ceftriaxone  was given in the ED, and started on vancomycin .  Concern if the rash patient has- may have been2/2 Keflex .  She was on Keflex  when the rash started. 2.5 L bolus given.   Review of Systems: As per HPI all other systems reviewed and negative.    Hospital Course:     Brief Summary:- 78 y.o. female with medical history significant for diastolic CHF, atrial fibrillation, COPD, hypertension, CKD 3, psoriasis, chronic  respiratory failure on as needed O2 admitted on 10/22/2023 with right leg cellulitis after failing antibiotics as outpatient  -Assessment and Plan: 1)Sepsis secondary to cellulitis of Right Lower Extremity--POA -Treated with  Keflex  and Doxy for 2 days Prior to admission -Much improved right leg erythema, swelling, warmth and tenderness-- -Much improved has left leg erythema, swelling warmth and tenderness  ---Denies significant leg pains or pleuritic symptoms (on Eliquis  for Afib) WBC 20.5 >>15.3>> 12.8>.14.4>>14.1>>16.9>>18.7 Afebrile - Treated with IV vancomycin  -Stopped aztreonam  -Started  IV Rocephin  on 10/23/23 -- Much improved  swelling and erythema of Both LE, Rt >> Lt.  -The patient states that outpatient venous Dopplers of the right lower extremity was negative for DVT,  report not available to me - Patient also PTA has been compliant with Eliquis  (for Afib) - PCT <0.10 CRP 1.6 -- Patient states that she has always had persistent leukocytosis for many many years without significant explanation, initially she was told that her chronic leukocytosis was because of smoking/tobacco use but she has not smoked for years now and she has continued to have persistent leukocytosis --- Elevated WBC noted however patient has no fevers leg cellulitis has improved significantly clinically--- PCT is not elevated, CRP 1.6 - Discussed with ID provider and ID pharmacist-----given significant clinical improvement and history of prior persistent chronic leukocytosis okay to discharge on oral antibiotics ---discharge on Doxy and Keflex  -- Patient has psoriasis  (psoriatic lesions) which are contributing to leg erythema -- Blood cultures requested in a.m. for completeness of workup--- -patient advised to follow-up with Dr. Katragadda for further evaluation of longstanding history of chronic and persistent leukocytosis  2)Acute kidney injury superimposed on chronic kidney disease (HCC) AKI----acute kidney  injury on CKD stage -3B --  creatinine on admission=2.09  ,  -baseline creatinine = 1.3 to 1.7 ,  creatinine has normalized and is now 1.0 -- renally adjust medications, avoid nephrotoxic agents / dehydration  / hypotension oh progress note 16  3)Chronic diastolic CHF (congestive heart failure) (HCC) Stable and compensated.  Last echo 01/2023 EF 60 to 65%, G1DD.  -Received IV fluids on admission per sepsis protocol - Discharged on Lasix  and metoprolol  --- No hypoxia with or post ambulation  4)Chronic Atrial fibrillation (HCC) -Continue Eliquis  for stroke prophylaxis, --BP and heart rate has improved -c/n PTA Cardizem  CD 180 mg daily --Continue PTA metoprolol  - Echo and LVEF as above #3 - Denies significant palpitations or dizziness -Ambulated in the hallways with mobility specialist--without significant dyspnea on exertion chest pains or significant dizziness while ambulating or post ambulation  5)HTN- -discharged on Lasix , metoprolol  and Cardizem   6)Psoriatic  Skin Changes --Legs mostly----topical Clobetasol  oint as ordered   7) mild chronic anemia--- Hgb stable above 11 - No bleeding concerns - Okay to continue Eliquis  for A-fib as above - Follow-up with hematologist for evaluation of persistent leukocytosis and CBC can be repeated at that time   Discharge Condition: Stable  Follow UP   Follow-up Information     Denise Rock HERO, FNP. Schedule an appointment as soon as possible for a visit in 1 week(s).   Specialty: Family Medicine Why: Repeat CBC and BMP Blood Tests Contact information: [address] [place] [address] [phone]         Rogers Hai, MD Follow up in 2 week(s).   Specialty: Hematology Why: for evaluation of Persistently Elevated White Blood Count Contact information: [address] [place] [address] [phone]                  Consults obtained -discussed with ID pharmacist and ID physician on-call  Diet and  Activity recommendation:  As advised  Discharge Instructions    Discharge Instructions     Call MD for:  difficulty breathing, headache or visual disturbances   Complete by: As directed    Call MD for:  persistant dizziness or light-headedness   Complete by: As directed    Call MD for:  redness, tenderness, or signs of infection (pain, swelling, redness, odor or green/yellow discharge around incision site)   Complete by: As directed    Call MD for:  severe uncontrolled pain   Complete by: As directed    Call MD for:  temperature >100.4   Complete by: As directed    Diet - low sodium heart healthy   Complete by: As directed    Discharge instructions   Complete by: As directed    1)Avoid ibuprofen/Advil/Aleve/Motrin/Goody Powders/Naproxen/BC powders/Meloxicam /Diclofenac /Indomethacin and other Nonsteroidal anti-inflammatory medications as these will make you more likely to bleed and can cause stomach ulcers, can also cause Kidney problems.   2)Repeat CBC and BMP Blood Tests with Primary Care Physician in 1 week   3)Watch for bleeding while on Blood Thinners--watch for blood in your stool which can make your stool black, maroon, mahogany or red---, blood in your urine which can make your urine pink or red, nosebleeds , also watch for possible bruising  -You are taking Apixaban /Eliquis --- which is a blood thinner--- be careful to avoid injury or falls  4)Please follow-up with hematologist/oncologist Dr. Hai Rogers, MD--for evaluation of your persistently high white blood count -Address: inside [facility] (4th Floor), [address] [place], [place], [place] [address] Phone: [phone]   Increase activity slowly   Complete by: As directed         Discharge Medications     Allergies as of 10/28/2023   No Known Allergies      Medication List     STOP taking these medications    cephALEXin  500 MG capsule Commonly known as: KEFLEX    doxycycline  100 MG tablet Commonly  known as: VIBRA -TABS   lisinopril  5 MG tablet Commonly known as: ZESTRIL        TAKE these medications    acetaminophen  650 MG CR tablet Commonly known as: TYLENOL  Take 650 mg by mouth every 8 (eight) hours as needed for pain. What changed: Another medication with the same name was removed. Continue taking this medication, and follow the directions you see here.   albuterol  108 (90 Base) MCG/ACT inhaler Commonly known as: VENTOLIN  HFA Inhale 2  puffs into the lungs every 6 (six) hours as needed for wheezing or shortness of breath.   apixaban  5 MG Tabs tablet Commonly known as: ELIQUIS  Take 1 tablet (5 mg total) by mouth 2 (two) times daily.   CALCIUM  PO Take 1 tablet by mouth daily.   Cartia  XT 180 MG 24 hr capsule Generic drug: diltiazem  take 1 capsule (180 milligram total) by mouth daily. What changed: See the new instructions.   CeraVe Diabetics Dry Skin Crea Apply 1 Application topically 2 (two) times daily.   escitalopram  20 MG tablet Commonly known as: LEXAPRO  Take 1 tablet (20 mg total) by mouth daily.   fluconazole  200 MG tablet Commonly known as: Diflucan  Take 1 tablet (200 mg total) by mouth once a week for 4 doses.   fluticasone -salmeterol 250-50 MCG/ACT Aepb Commonly known as: ADVAIR inhale 1 puff into the lungs in the morning and at bedtime.   furosemide  20 MG tablet Commonly known as: LASIX  Take 1 tablet (20 mg total) by mouth daily as needed. For Fluid What changed:  when to take this reasons to take this additional instructions   HYDROcodone -acetaminophen  5-325 MG tablet Commonly known as: NORCO/VICODIN Take 1 tablet by mouth every 6 (six) hours as needed for moderate pain (pain score 4-6).   metoprolol  tartrate 25 MG tablet Commonly known as: LOPRESSOR  Take 1 tablet (25 mg total) by mouth 2 (two) times daily.   nystatin  powder Commonly known as: MYCOSTATIN /NYSTOP  Apply 1 Application topically 3 (three) times daily.   traZODone  50 MG  tablet Commonly known as: DESYREL  Take 1 tablet (50 mg total) by mouth at bedtime.   triamcinolone  cream 0.1 % Commonly known as: KENALOG  Apply 1 Application topically 2 (two) times daily.   Vitamin D3 25 MCG (1000 UT) Caps Take 2,000 Units by mouth.       ASK your doctor about these medications    cephALEXin  500 MG capsule Commonly known as: KEFLEX  Take 1 capsule (500 mg total) by mouth 3 (three) times daily for 5 days. Ask about: Should I take this medication?   doxycycline  100 MG tablet Commonly known as: VIBRA -TABS Take 1 tablet (100 mg total) by mouth 2 (two) times daily for 5 days. Ask about: Should I take this medication?       Major procedures and Radiology Reports - PLEASE review detailed and final reports for all details, in brief -   DG Chest Portable 1 View Result Date: 10/22/2023 CLINICAL DATA:  weakness, sepsis EXAM: PORTABLE CHEST - 1 VIEW COMPARISON:  March 03, 2023 FINDINGS: No focal airspace consolidation, pleural effusion, or pneumothorax. No cardiomegaly. Aortic atherosclerosis. No acute fracture or destructive lesion. Multilevel thoracic osteophytosis. IMPRESSION: No acute cardiopulmonary abnormality. Electronically Signed   By: Rogelia Myers M.D.   On: 10/22/2023 15:48    Micro Results  Recent Results (from the past 240 hours)  Culture, blood (Routine X 2) w Reflex to ID Panel     Status: None   Collection Time: 10/28/23 11:04 AM   Specimen: BLOOD  Result Value Ref Range Status   Specimen Description BLOOD BLOOD RIGHT HAND  Final   Special Requests   Final    BOTTLES DRAWN AEROBIC ONLY Blood Culture results may not be optimal due to an inadequate volume of blood received in culture bottles   Culture   Final    NO GROWTH 5 DAYS Performed at [facility], [address], [place], [place] [address]    Report Status 11/02/2023 FINAL  Final  Culture, blood (Routine X 2) w Reflex to ID Panel     Status: None   Collection Time: 10/28/23 11:06 AM    Specimen: BLOOD  Result Value Ref Range Status   Specimen Description BLOOD BLOOD LEFT HAND  Final   Special Requests   Final    BOTTLES DRAWN AEROBIC AND ANAEROBIC Blood Culture results may not be optimal due to an inadequate volume of blood received in culture bottles   Culture   Final    NO GROWTH 5 DAYS Performed at [facility], [address], [place], [place] [address]    Report Status 11/02/2023 FINAL  Final    Today   Subjective    Denise Underwood today has no    new complaints No fever  Or chills   No Nausea, Vomiting or Diarrhea  --- Ambulating without significant dyspnea on exertion no hypoxia, no dizziness or palpitation    Patient has been seen and examined prior to discharge   Objective   Blood pressure 118/61, pulse 67, temperature 98.5 F (36.9 C), resp. rate 20, height 5' 4 (1.626 m), weight 79.8 kg, SpO2 94%.  No intake or output data in the 24 hours ending 11/03/23 1608  Exam Gen:- Awake Alert, in no acute distress HEENT:- [place].AT, No sclera icterus Neck-Supple Neck,No JVD,.  Lungs-  CTAB , fair symmetrical air movement CV- S1, S2 normal, irregular , tachy Abd-  +ve B.Sounds, Abd Soft, No tenderness,    Extremity/Skin:-Much improved pitting edema, pedal pulses present  Psych-affect is appropriate, oriented x3 Neuro-generalized weakness, no new focal deficits, no tremors MSK--much improved right leg swelling, improving erythema, much improved warmth, mostly resolved tenderness--please see photos in epic - Much improved erythematous macular lesions over left lower extremity--please see photos in epic  --  Media Information   Document Information  Photos    10/28/2023 15:11  Attached To:  Hospital Encounter on 10/22/23  Source Information  Pearlean Manus, MD  [facility]     Data Review   CBC w Diff:  Lab Results  Component Value Date   WBC 18.7 (H) 10/28/2023   HGB 11.3 (L) 10/28/2023   HGB 12.2 10/14/2023   HCT 35.2 (L)  10/28/2023   HCT 37.0 10/14/2023   PLT 225 10/28/2023   PLT 201 10/14/2023   LYMPHOPCT 22 10/28/2023   BANDSPCT 6 02/03/2023   MONOPCT 3 10/28/2023   EOSPCT 11 10/28/2023   BASOPCT 0 10/28/2023    CMP:  Lab Results  Component Value Date   NA 143 10/27/2023   NA 141 10/14/2023   K 4.2 10/27/2023   CL 113 (H) 10/27/2023   CO2 23 10/27/2023   BUN 28 (H) 10/27/2023   BUN 31 (H) 10/14/2023   CREATININE 1.01 (H) 10/27/2023   PROT 6.6 10/22/2023   PROT 6.6 10/14/2023   ALBUMIN 2.3 (L) 10/24/2023   ALBUMIN 4.0 10/14/2023   BILITOT 0.5 10/22/2023   BILITOT 0.3 10/14/2023   ALKPHOS 88 10/22/2023   AST 16 10/22/2023   ALT 11 10/22/2023  .  Total Discharge time is about 33 minutes I Manus Pearlean M.D on 11/03/2023 at 4:08 PM  Go to [email or web address] -  for contact info  [facility] - Office  [phone]

## 2023-11-05 ENCOUNTER — Other Ambulatory Visit: Payer: Self-pay | Admitting: Family Medicine

## 2023-11-05 ENCOUNTER — Other Ambulatory Visit: Payer: Self-pay | Admitting: *Deleted

## 2023-11-05 ENCOUNTER — Ambulatory Visit (HOSPITAL_COMMUNITY): Admission: RE | Admit: 2023-11-05 | Source: Ambulatory Visit

## 2023-11-05 NOTE — Patient Outreach (Signed)
 Transition of Care week 2  Visit Note  11/05/2023  Name: Denise Underwood MRN: [id number]          DOB: [birth year]  Situation: Patient enrolled in [facility] 30-day program. Visit completed with patient by telephone.   Background:    Past Medical History:  Diagnosis Date   Anxiety    Arthritis    Hyperlipidemia    Hypertension    Osteopenia 07/04/2021   Prediabetes 07/09/2021   Psoriasis     Assessment: Patient Reported Symptoms: Cognitive Cognitive Status: Able to follow simple commands, Alert and oriented to person, place, and time, Normal speech and language skills      Neurological Neurological Review of Symptoms: No symptoms reported    HEENT HEENT Symptoms Reported: No symptoms reported      Cardiovascular Cardiovascular Symptoms Reported: No symptoms reported Cardiovascular Conditions: Hypertension, Heart failure Cardiovascular Management Strategies: Medication therapy, Adequate rest, Diet modification Cardiovascular Self-Management Outcome: 3 (uncertain) Cardiovascular Comment: pt does not weigh daily, not interested  Respiratory Respiratory Symptoms Reported: No symptoms reported Other Respiratory Symptoms: pt is on oxygen  3 liters via [place],   02 saturation today 96%, pulse 77 per pt report Respiratory Conditions: COPD Respiratory Self-Management Outcome: 3 (uncertain) Respiratory Comment: pt using inhalers as prescribed  Endocrine Patient reports the following symptoms related to hypoglycemia or hyperglycemia : No symptoms reported Is patient diabetic?: No    Gastrointestinal Gastrointestinal Symptoms Reported: No symptoms reported   Nutrition Risk Screen (CP): No indicators present  Genitourinary Genitourinary Symptoms Reported: No symptoms reported Genitourinary Conditions: Chronic kidney disease Genitourinary Self-Management Outcome: 3 (uncertain)  Integumentary Integumentary Symptoms Reported: Other Additional Integumentary Details: cellulitis of RLE Skin  Conditions: Other Other Skin Conditions: cellulitis Skin Management Strategies: Medication therapy Skin Self-Management Outcome: 3 (uncertain) Skin Comment: pt is elevating legs throughout the day, pt states she has slight edema (chronic)  Musculoskeletal Musculoskelatal Symptoms Reviewed: Difficulty walking, Unsteady gait Musculoskeletal Conditions: Unsteady gait Musculoskeletal Management Strategies: Medical device Musculoskeletal Self-Management Outcome: 4 (good)      Psychosocial Psychosocial Symptoms Reported: No symptoms reported   Major Change/Loss/Stressor/Fears (CP): Denies Quality of Family Relationships: supportive, disruptive, helpful Do you feel physically threatened by others?: No   Vitals:   11/05/23 1316  Pulse: 77    Medications Reviewed Today     Reviewed by Aura Mliss LABOR, RN (Registered Nurse) on 11/05/23 at 1306  Med List Status: <None>   Medication Order Taking? Sig Documenting Provider Last Dose Status Informant  acetaminophen  (TYLENOL ) 650 MG CR tablet [id number]  Take 650 mg by mouth every 8 (eight) hours as needed for pain. [provider]  Active Child, Self, Pharmacy Records  albuterol  (VENTOLIN  HFA) 108 (90 Base) MCG/ACT inhaler [id number]  Inhale 2 puffs into the lungs every 6 (six) hours as needed for wheezing or shortness of breath. Pearlean Manus, MD  Active   apixaban  (ELIQUIS ) 5 MG TABS tablet [id number]  Take 1 tablet (5 mg total) by mouth 2 (two) times daily. Pearlean Manus, MD  Active   CALCIUM  PO [id number]  Take 1 tablet by mouth daily. [provider]  Active Child, Self, Pharmacy Records  Cholecalciferol (VITAMIN D3) 1000 units CAPS [id number]  Take 2,000 Units by mouth. [provider]  Active Child, Self, Pharmacy Records           Med Note JACKOLYN WADDELL DEL   Tue Feb 04, 2023  9:56 AM)    diltiazem  (CARTIA  XT) 180 MG 24 hr capsule [id number]  take  1 capsule (180 milligram total) by mouth daily. [name], Rock HERO,  FNP  Active Child, Self, Pharmacy Records  Emollient [facility] DIABETICS DRY SKIN) CREA [id number]  Apply 1 Application topically 2 (two) times daily. [name], FNP  Active Child, Self, Pharmacy Records  escitalopram  (LEXAPRO ) 20 MG tablet [id number]  Take 1 tablet (20 mg total) by mouth daily. [name], FNP  Active Child, Self, Pharmacy Records  fluconazole  (DIFLUCAN ) 200 MG tablet [id number]  Take 1 tablet (200 mg total) by mouth once a week for 4 doses. [name], FNP  Active Child, Self, Pharmacy Records  fluticasone -salmeterol (ADVAIR) 250-50 MCG/ACT AEPB [id number]  inhale 1 puff into the lungs in the morning and at bedtime. [name], FNP  Active Child, Self, Pharmacy Records  furosemide  (LASIX ) 20 MG tablet [id number]  Take 1 tablet (20 mg total) by mouth daily as needed. For Fluid Pearlean Manus, MD  Active   HYDROcodone -acetaminophen  (NORCO/VICODIN) 5-325 MG tablet [id number]  Take 1 tablet by mouth every 6 (six) hours as needed for moderate pain (pain score 4-6). [provider]  Active Child, Self, Pharmacy Records  metoprolol  tartrate (LOPRESSOR ) 25 MG tablet [id number]  Take 1 tablet (25 mg total) by mouth 2 (two) times daily. [name], FNP  Active Child, Self, Pharmacy Records  nystatin  (MYCOSTATIN /NYSTOP ) powder [id number]  Apply 1 Application topically 3 (three) times daily. [name], FNP  Active Child, Self, Pharmacy Records  pantoprazole  (PROTONIX ) 40 MG tablet [id number]  take 1 tablet (40 milligram total) by mouth daily. [name], FNP  Active   traZODone  (DESYREL ) 50 MG tablet [id number]  Take 1 tablet (50 mg total) by mouth at bedtime. [name], FNP  Active Child, Self, Pharmacy Records  triamcinolone  cream (KENALOG ) 0.1 % [id number]  Apply 1 Application topically 2 (two) times daily. [name], FNP  Active Child, Self, Pharmacy Records  Med List Note (Ward, Angelica, CPhT 10/22/23 1502): Eliquis  is at [facility]            Goals  Addressed             This Visit's Progress    VBCI Transitions of Care (TOC) Care Plan       Problems:  Recent Hospitalization for treatment of  cellulitus of rt lower extremity Knowledge Deficit Related to cellulitus of rt lower extremity Patient states doing well   no new concerns reported, pt not interested in daily weights, pt on oxygen  3 liters via [place],  02 saturation today 97% with oxygen   Goal:  Over the next 30 days, the patient will not experience hospital readmission  Interventions:  Transitions of Care: Doctor Visits  - discussed the importance of doctor visits Reviewed all upcoming appointments, pt did not go to renal US  appointment today, states she will have to coordinate with her daughter and reschedule Reviewed signs/ symptoms of infection, cellulitis Encouraged pt to elevate legs throughout the day Reviewed oxygen  safety Reviewed safety precautions and being careful with oxygen  tubing  Patient Self Care Activities:  Attend all scheduled provider appointments Call pharmacy for medication refills 3-7 days in advance of running out of medications Call provider office for new concerns or questions  Notify RN Care Manager of [facility] call rescheduling needs Participate in Transition of Care Program/Attend [facility] scheduled calls Take medications as prescribed    Plan:  Next PCP appointment scheduled for: [phone] Telephone follow up appointment with care management team member scheduled for: 11/13/23 @  11 am        Recommendation:   PCP Follow-up- 11/10/23 @ 1015 am  Follow Up Plan:   Telephone follow-up 11/13/23 @ 11 am  Mliss Creed [facility], BSN RN Care Manager/ Transition of Care [place] [phone]

## 2023-11-05 NOTE — Patient Instructions (Signed)
 Visit Information  Thank you for taking time to visit with me today. Please don't hesitate to contact me if I can be of assistance to you before our next scheduled telephone appointment.  Our next appointment is by telephone on 11/13/23 @ 11 am  Following is a copy of your care plan:   Goals Addressed             This Visit's Progress    VBCI Transitions of Care (TOC) Care Plan       Problems:  Recent Hospitalization for treatment of  cellulitus of rt lower extremity Knowledge Deficit Related to cellulitus of rt lower extremity Patient states doing well   no new concerns reported, pt not interested in daily weights, pt on oxygen  3 liters via [place],  02 saturation today 97% with oxygen   Goal:  Over the next 30 days, the patient will not experience hospital readmission  Interventions:  Transitions of Care: Doctor Visits  - discussed the importance of doctor visits Reviewed all upcoming appointments, pt did not go to renal US  appointment today, states she will have to coordinate with her daughter and reschedule Reviewed signs/ symptoms of infection, cellulitis Encouraged pt to elevate legs throughout the day Reviewed oxygen  safety Reviewed safety precautions and being careful with oxygen  tubing  Patient Self Care Activities:  Attend all scheduled provider appointments Call pharmacy for medication refills 3-7 days in advance of running out of medications Call provider office for new concerns or questions  Notify RN Care Manager of [facility] call rescheduling needs Participate in Transition of Care Program/Attend TOC scheduled calls Take medications as prescribed    Plan:  Next PCP appointment scheduled for: [id number] Telephone follow up appointment with care management team member scheduled for: 11/13/23 @ 11 am        Patient verbalizes understanding of instructions and care plan provided today and agrees to view in MyChart. Active MyChart status and patient understanding of how to  access instructions and care plan via MyChart confirmed with patient.     Telephone follow up appointment with care management team member scheduled for:  11/13/23 @ 11 am  Please call the care guide team at [phone] if you need to cancel or reschedule your appointment.   Please call the Suicide and Crisis Lifeline: 988 call the [place]  National Suicide Prevention Lifeline: 646-264-9177 or TTY: (629)846-8573 TTY 469-185-1818) to talk to a trained counselor call 1-800-273-TALK (toll free, 24 hour hotline) go to [facility] [address], [place] [phone]) call the [facility] Crisis Line: 6393895984 call 911 if you are experiencing a Mental Health or Behavioral Health Crisis or need someone to talk to.  Mliss Creed [facility], BSN RN Care Manager/ Transition of Care [place]/ [facility] [phone]

## 2023-11-10 ENCOUNTER — Encounter: Payer: Self-pay | Admitting: Family Medicine

## 2023-11-10 ENCOUNTER — Ambulatory Visit: Admitting: Family Medicine

## 2023-11-10 VITALS — BP 139/84 | HR 87 | Temp 98.2°F | Ht 64.0 in | Wt 175.0 lb

## 2023-11-10 DIAGNOSIS — D72829 Elevated white blood cell count, unspecified: Secondary | ICD-10-CM | POA: Diagnosis not present

## 2023-11-10 DIAGNOSIS — R6520 Severe sepsis without septic shock: Secondary | ICD-10-CM

## 2023-11-10 DIAGNOSIS — N189 Chronic kidney disease, unspecified: Secondary | ICD-10-CM | POA: Diagnosis not present

## 2023-11-10 DIAGNOSIS — N179 Acute kidney failure, unspecified: Secondary | ICD-10-CM

## 2023-11-10 DIAGNOSIS — J9611 Chronic respiratory failure with hypoxia: Secondary | ICD-10-CM | POA: Diagnosis not present

## 2023-11-10 DIAGNOSIS — I5032 Chronic diastolic (congestive) heart failure: Secondary | ICD-10-CM | POA: Diagnosis not present

## 2023-11-10 DIAGNOSIS — L03115 Cellulitis of right lower limb: Secondary | ICD-10-CM

## 2023-11-10 DIAGNOSIS — I4891 Unspecified atrial fibrillation: Secondary | ICD-10-CM

## 2023-11-10 DIAGNOSIS — A419 Sepsis, unspecified organism: Secondary | ICD-10-CM

## 2023-11-10 DIAGNOSIS — Z7901 Long term (current) use of anticoagulants: Secondary | ICD-10-CM | POA: Diagnosis not present

## 2023-11-10 DIAGNOSIS — N1832 Chronic kidney disease, stage 3b: Secondary | ICD-10-CM | POA: Diagnosis not present

## 2023-11-10 DIAGNOSIS — I10 Essential (primary) hypertension: Secondary | ICD-10-CM

## 2023-11-10 NOTE — Patient Instructions (Signed)
 Please follow-up with hematologist/oncologist Dr. Alean Stands, MD--for evaluation of your persistently high white blood count  -Address: inside [facility] (4th Floor), [address] [place], [place], [place] [address] Phone: [phone]

## 2023-11-10 NOTE — Progress Notes (Signed)
 Established Patient Office Visit  Subjective   Patient ID: Denise Underwood, female    DOB: 1946-04-03  Age: 78 y.o. MRN: [id number]  Chief Complaint  Patient presents with   Transitions Of Care    HPI  Today's visit was for Transitional Care Management.  The patient was discharged from [facility] on 10/28/23 with a primary diagnosis of cellulitis of right lower extremity.   Contact with the patient and/or caregiver, by a clinical staff member, was made on 10/29/23 and was documented as a telephone encounter within the EMR.  Through chart review and discussion with the patient I have determined that management of their condition is of moderate complexity.   Per hospital discharge summary:  Brief Summary:- 78 y.o. female with medical history significant for diastolic CHF, atrial fibrillation, COPD, hypertension, CKD 3, psoriasis, chronic respiratory failure on as needed O2 admitted on 10/22/2023 with right leg cellulitis after failing antibiotics as outpatient   -Assessment and Plan: 1)Sepsis secondary to cellulitis of Right Lower Extremity--POA -Treated with  Keflex  and Doxy for 2 days Prior to admission -Much improved right leg erythema, swelling, warmth and tenderness-- -Much improved has left leg erythema, swelling warmth and tenderness  ---Denies significant leg pains or pleuritic symptoms (on Eliquis  for Afib) WBC 20.5 >>15.3>> 12.8>.14.4>>14.1>>16.9>>18.7 Afebrile - Treated with IV vancomycin  -Stopped aztreonam  -Started  IV Rocephin  on 10/23/23 -- Much improved  swelling and erythema of Both LE, Rt >> Lt.  -The patient states that outpatient venous Dopplers of the right lower extremity was negative for DVT,  report not available to me - Patient also PTA has been compliant with Eliquis  (for Afib) - PCT <0.10 CRP 1.6 -- Patient states that she has always had persistent leukocytosis for many many years without significant explanation, initially she was told that her chronic  leukocytosis was because of smoking/tobacco use but she has not smoked for years now and she has continued to have persistent leukocytosis --- Elevated WBC noted however patient has no fevers leg cellulitis has improved significantly clinically--- PCT is not elevated, CRP 1.6 - Discussed with ID provider and ID pharmacist-----given significant clinical improvement and history of prior persistent chronic leukocytosis okay to discharge on oral antibiotics ---discharge on Doxy and Keflex  -- Patient has psoriasis  (psoriatic lesions) which are contributing to leg erythema -- Blood cultures requested in a.m. for completeness of workup--- -patient advised to follow-up with Dr. Katragadda for further evaluation of longstanding history of chronic and persistent leukocytosis   2)Acute kidney injury superimposed on chronic kidney disease (HCC) AKI----acute kidney injury on CKD stage -3B --  creatinine on admission=2.09  ,  -baseline creatinine = 1.3 to 1.7 ,  creatinine has normalized and is now 1.0 -- renally adjust medications, avoid nephrotoxic agents / dehydration  / hypotension oh progress note 16   3)Chronic diastolic CHF (congestive heart failure) (HCC) Stable and compensated.  Last echo 01/2023 EF 60 to 65%, G1DD.  -Received IV fluids on admission per sepsis protocol - Discharged on Lasix  and metoprolol  --- No hypoxia with or post ambulation   4)Chronic Atrial fibrillation (HCC) -Continue Eliquis  for stroke prophylaxis, --BP and heart rate has improved -c/n PTA Cardizem  CD 180 mg daily --Continue PTA metoprolol  - Echo and LVEF as above #3 - Denies significant palpitations or dizziness -Ambulated in the hallways with mobility specialist--without significant dyspnea on exertion chest pains or significant dizziness while ambulating or post ambulation   5)HTN- -discharged on Lasix , metoprolol  and Cardizem    6)Psoriatic  Skin Changes --Legs mostly----topical Clobetasol  oint as ordered    7)  mild chronic anemia--- Hgb stable above 11 - No bleeding concerns - Okay to continue Eliquis  for A-fib as above - Follow-up with hematologist for evaluation of persistent leukocytosis and CBC can be repeated at that time  Recommendations for primary care physician for things to follow:  1)Avoid ibuprofen/Advil/Aleve/Motrin/Goody Powders/Naproxen/BC powders/Meloxicam /Diclofenac /Indomethacin and other Nonsteroidal anti-inflammatory medications as these will make you more likely to bleed and can cause stomach ulcers, can also cause Kidney problems.    2)Repeat CBC and BMP Blood Tests with Primary Care Physician in 1 week    3)Watch for bleeding while on Blood Thinners--watch for blood in your stool which can make your stool black, maroon, mahogany or red---, blood in your urine which can make your urine pink or red, nosebleeds , also watch for possible bruising   -You are taking Apixaban /Eliquis --- which is a blood thinner--- be careful to avoid injury or falls   4)Please follow-up with hematologist/oncologist Dr. Alean Stands, MD--for evaluation of your persistently high white blood count  Denise Underwood reports feeling better since discharge. Denies erythema, increased pain or swelling in legs. No fever. She denies chest pain. Shortness of breath at baseline. Intermittent edema in legs bilaterally at baseline. She takes lasix  prn with good relief.   She has been on oxygen  at home since September of 2024 for chronic respiratory failure. She uses home 02 at 3L. Without 02 her oxygen  saturations will drop to the 70s with ambulation. She does not have a portable oxygen  concentrator however.   Past Medical History:  Diagnosis Date   Anxiety    Arthritis    Hyperlipidemia    Hypertension    Osteopenia 07/04/2021   Prediabetes 07/09/2021   Psoriasis       ROS As per HPI.    Objective:     BP 139/84   Pulse 87   Temp 98.2 F (36.8 C) (Temporal)   Ht 5' 4 (1.626 m)   Wt 175 lb  (79.4 kg)   SpO2 90%   BMI 30.04 kg/m    Physical Exam Vitals and nursing note reviewed.  Constitutional:      General: She is not in acute distress.    Appearance: She is not ill-appearing, toxic-appearing or diaphoretic.   Cardiovascular:     Rate and Rhythm: Normal rate and regular rhythm.     Heart sounds: Normal heart sounds. No murmur heard. Pulmonary:     Effort: Pulmonary effort is normal. No respiratory distress.     Breath sounds: Normal breath sounds. No wheezing, rhonchi or rales.  Chest:     Chest wall: No tenderness.   Musculoskeletal:     Right lower leg: No edema.     Left lower leg: No edema.   Skin:    General: Skin is warm.   Neurological:     Mental Status: She is alert and oriented to person, place, and time.     Gait: Gait abnormal (using rolling walker).   Psychiatric:        Mood and Affect: Mood normal.        Behavior: Behavior normal.      No results found for any visits on 11/10/23.    The 10-year ASCVD risk score (Arnett DK, et al., 2019) is: 31.8%    Assessment & Plan:   Denise Underwood was seen today for transitions of care.  Diagnoses and all orders for this visit:  Cellulitis of right  lower extremity Resolved. Labs pending as below. Reviewed hospital discharge summary, labs, imaging.  -     CMP14+EGFR -     CBC with Differential/Platelet  Severe sepsis (HCC) -     CMP14+EGFR -     CBC with Differential/Platelet  Primary hypertension BP at goal.  -     CBC with Differential/Platelet  Acute kidney injury superimposed on chronic kidney disease (HCC) Avoid NSAIDs. CMP pending.  -     CMP14+EGFR -     CBC with Differential/Platelet  CKD stage 3b, GFR 30-44 ml/min (HCC) -     CMP14+EGFR -     CBC with Differential/Platelet  Chronic diastolic CHF (congestive heart failure) (HCC) Euvolemic on exam today.   Atrial fibrillation, unspecified type (HCC) RRR today on exam.   Chronic anticoagulation  Leukocytosis,  unspecified type Will repeat CBC today. Contact info provided for follow up with hematology.  -     CBC with Differential/Platelet  Chronic hypoxic respiratory failure, on home oxygen  therapy [facility]) Order for POC. Home 02 at 3L -     For home use only DME oxygen   Follow up with PCP as needed.   The patient indicates understanding of these issues and agrees with the plan.   Denise CHRISTELLA Search, FNP

## 2023-11-11 LAB — CBC WITH DIFFERENTIAL/PLATELET
Basophils Absolute: 0.1 10*3/uL (ref 0.0–0.2)
Basos: 1 %
EOS (ABSOLUTE): 1.2 10*3/uL — ABNORMAL HIGH (ref 0.0–0.4)
Eos: 8 %
Hematocrit: 38.1 % (ref 34.0–46.6)
Hemoglobin: 11.9 g/dL (ref 11.1–15.9)
Immature Grans (Abs): 0.1 10*3/uL (ref 0.0–0.1)
Immature Granulocytes: 1 %
Lymphocytes Absolute: 2.9 10*3/uL (ref 0.7–3.1)
Lymphs: 19 %
MCH: 31.8 pg (ref 26.6–33.0)
MCHC: 31.2 g/dL — ABNORMAL LOW (ref 31.5–35.7)
MCV: 102 fL — ABNORMAL HIGH (ref 79–97)
Monocytes Absolute: 1 10*3/uL — ABNORMAL HIGH (ref 0.1–0.9)
Monocytes: 7 %
Neutrophils Absolute: 9.6 10*3/uL — ABNORMAL HIGH (ref 1.4–7.0)
Neutrophils: 64 %
Platelets: 306 10*3/uL (ref 150–450)
RBC: 3.74 x10E6/uL — ABNORMAL LOW (ref 3.77–5.28)
RDW: 12 % (ref 11.7–15.4)
WBC: 14.9 10*3/uL — ABNORMAL HIGH (ref 3.4–10.8)

## 2023-11-11 LAB — CMP14+EGFR
ALT: 14 IU/L (ref 0–32)
AST: 21 IU/L (ref 0–40)
Albumin: 3.9 g/dL (ref 3.8–4.8)
Alkaline Phosphatase: 99 IU/L (ref 44–121)
BUN/Creatinine Ratio: 22 (ref 12–28)
BUN: 24 mg/dL (ref 8–27)
Bilirubin Total: 0.3 mg/dL (ref 0.0–1.2)
CO2: 26 mmol/L (ref 20–29)
Calcium: 9.8 mg/dL (ref 8.7–10.3)
Chloride: 97 mmol/L (ref 96–106)
Creatinine, Ser: 1.09 mg/dL — AB (ref 0.57–1.00)
Globulin, Total: 2.6 g/dL (ref 1.5–4.5)
Glucose: 113 mg/dL — AB (ref 70–99)
Potassium: 4.7 mmol/L (ref 3.5–5.2)
Sodium: 142 mmol/L (ref 134–144)
Total Protein: 6.5 g/dL (ref 6.0–8.5)
eGFR: 52 mL/min/{1.73_m2} — AB (ref >59)

## 2023-11-12 ENCOUNTER — Telehealth: Payer: Self-pay

## 2023-11-12 ENCOUNTER — Ambulatory Visit: Payer: Self-pay | Admitting: Family Medicine

## 2023-11-12 DIAGNOSIS — I4891 Unspecified atrial fibrillation: Secondary | ICD-10-CM | POA: Diagnosis not present

## 2023-11-12 DIAGNOSIS — G9341 Metabolic encephalopathy: Secondary | ICD-10-CM | POA: Diagnosis not present

## 2023-11-12 DIAGNOSIS — J441 Chronic obstructive pulmonary disease with (acute) exacerbation: Secondary | ICD-10-CM | POA: Diagnosis not present

## 2023-11-12 DIAGNOSIS — I5033 Acute on chronic diastolic (congestive) heart failure: Secondary | ICD-10-CM | POA: Diagnosis not present

## 2023-11-12 NOTE — Telephone Encounter (Signed)
 Pt thought [employer] was on the her oxygen  tank sent order to them. She is not a client of theirs.  Pt has had her O2 since Sept, there is no name on the tanks thinks it may have come from [place], will try calling [employer]. She is not a client of [employer] either.  Calling [facility]  [phone], they do not have her as an oxygen  client either.  From hospital discharge in Sept will try calling [employer] [phone]. She is a client of theirs, faxing order to them at [phone].

## 2023-11-12 NOTE — Telephone Encounter (Signed)
 Copied from CRM [id number]. Topic: General - Call Back - No Documentation >> Nov 12, 2023  1:49 PM Tiffany S wrote: Reason for CRM: Patient wanted to give provider the name of her oxygen    [employer]  [phone]  FAX [phone]

## 2023-11-12 NOTE — Telephone Encounter (Signed)
 Aware order was faxed to [employer] earlier

## 2023-11-13 ENCOUNTER — Other Ambulatory Visit: Payer: Self-pay | Admitting: *Deleted

## 2023-11-13 NOTE — Patient Instructions (Signed)
 Visit Information  Thank you for taking time to visit with me today. Please don't hesitate to contact me if I can be of assistance to you before our next scheduled telephone appointment.  Our next appointment is by telephone on 11/21/23 @ 1015 am  Following is a copy of your care plan:   Goals Addressed             This Visit's Progress    VBCI Transitions of Care (TOC) Care Plan       Problems:  Recent Hospitalization for treatment of  cellulitus of rt lower extremity Knowledge Deficit Related to cellulitus of rt lower extremity Patient states doing well   no new concerns reported, pt not interested in daily weights, pt on oxygen  3 liters via [place],  reports taking all medications as prescribed, pt rescheduled renal ultrasound  Goal:  Over the next 30 days, the patient will not experience hospital readmission  Interventions:  Transitions of Care: Doctor Visits  - discussed the importance of doctor visits Reviewed all upcoming appointments Reinforced signs/ symptoms of infection, cellulitis Encouraged pt to elevate legs throughout the day Reinforced oxygen  safety Reinforced safety precautions and being careful with oxygen  tubing  Patient Self Care Activities:  Attend all scheduled provider appointments Call pharmacy for medication refills 3-7 days in advance of running out of medications Call provider office for new concerns or questions  Notify RN Care Manager of TOC call rescheduling needs Participate in Transition of Care Program/Attend TOC scheduled calls Take medications as prescribed   Be careful with oxygen  tubing so you do not fall Elevate legs  Plan:  Renal ultrasound 11/19/23 @ 1130 am Telephone follow up appointment with care management team member scheduled for: 11/21/23 @ 1015 am        Patient verbalizes understanding of instructions and care plan provided today and agrees to view in MyChart. Active MyChart status and patient understanding of how to access  instructions and care plan via MyChart confirmed with patient.     Telephone follow up appointment with care management team member scheduled for: 11/21/23 @ 1015 am  Please call the care guide team at [phone] if you need to cancel or reschedule your appointment.   Please call the Suicide and Crisis Lifeline: 988 call the [place]  National Suicide Prevention Lifeline: 210-510-5833 or TTY: (367)251-3903 TTY 432-214-8607) to talk to a trained counselor call 1-800-273-TALK (toll free, 24 hour hotline) go to [facility] [address], [place] [phone]) call the [facility] Crisis Line: [phone] call 911 if you are experiencing a Mental Health or Behavioral Health Crisis or need someone to talk to.  Mliss Creed [facility], BSN RN Care Manager/ Transition of Care [place]/ [facility] [phone]

## 2023-11-13 NOTE — Patient Outreach (Addendum)
 Transition of Care week 3  Visit Note  11/13/2023  Name: Denise Underwood MRN: [id number]          DOB: 09/28/1945  Situation: Patient enrolled in [facility] 30-day program. Visit completed with patient by telephone.   Background:    Past Medical History:  Diagnosis Date   Anxiety    Arthritis    Hyperlipidemia    Hypertension    Osteopenia 07/04/2021   Prediabetes 07/09/2021   Psoriasis     Assessment: Patient Reported Symptoms: Cognitive Cognitive Status: No symptoms reported, Able to follow simple commands, Alert and oriented to person, place, and time, Normal speech and language skills      Neurological Neurological Review of Symptoms: No symptoms reported    HEENT HEENT Symptoms Reported: No symptoms reported      Cardiovascular Cardiovascular Symptoms Reported: No symptoms reported    Respiratory Respiratory Symptoms Reported: No symptoms reported Other Respiratory Symptoms: uses oxygen  3 liters via [place] Respiratory Self-Management Outcome: 3 (uncertain)  Endocrine Endocrine Symptoms Reported: No symptoms reported    Gastrointestinal Gastrointestinal Symptoms Reported: No symptoms reported      Genitourinary Genitourinary Symptoms Reported: No symptoms reported    Integumentary Integumentary Symptoms Reported: No symptoms reported Additional Integumentary Details: pt states cellulitis to RLE is resolved Skin Comment: pt continues to elevate legs throughout the day. slight edema (chronic)  Musculoskeletal Musculoskelatal Symptoms Reviewed: Difficulty walking, Unsteady gait Musculoskeletal Management Strategies: Medical device Musculoskeletal Self-Management Outcome: 4 (good)      Psychosocial Psychosocial Symptoms Reported: No symptoms reported         There were no vitals filed for this visit.  Medications Reviewed Today     Reviewed by Aura Mliss LABOR, RN (Registered Nurse) on 11/13/23 at 1131  Med List Status: <None>   Medication Order Taking? Sig  Documenting Provider Last Dose Status Informant  acetaminophen  (TYLENOL ) 650 MG CR tablet [id number]  Take 650 mg by mouth every 8 (eight) hours as needed for pain. [provider]  Active Child, Self, Pharmacy Records  albuterol  (VENTOLIN  HFA) 108 (90 Base) MCG/ACT inhaler [id number]  Inhale 2 puffs into the lungs every 6 (six) hours as needed for wheezing or shortness of breath. Pearlean Manus, MD  Active   apixaban  (ELIQUIS ) 5 MG TABS tablet [id number]  Take 1 tablet (5 mg total) by mouth 2 (two) times daily. Pearlean Manus, MD  Active   CALCIUM  PO [id number]  Take 1 tablet by mouth daily. [provider]  Active Child, Self, Pharmacy Records  Cholecalciferol (VITAMIN D3) 1000 units CAPS [id number]  Take 2,000 Units by mouth. [provider]  Active Child, Self, Pharmacy Records           Med Note JACKOLYN WADDELL DEL   Tue Feb 04, 2023  9:56 AM)    diltiazem  (CARTIA  XT) 180 MG 24 hr capsule [id number]  take 1 capsule (180 milligram total) by mouth daily. [name], FNP  Active   Emollient [facility] DIABETICS DRY SKIN) CREA [id number]  Apply 1 Application topically 2 (two) times daily. [name], FNP  Active Child, Self, Pharmacy Records  escitalopram  (LEXAPRO ) 20 MG tablet [id number]  Take 1 tablet (20 mg total) by mouth daily. [name], FNP  Active Child, Self, Pharmacy Records  fluticasone -salmeterol (ADVAIR) 250-50 MCG/ACT AEPB [id number]  inhale 1 puff into the lungs in the morning and at bedtime. [name], FNP  Active   furosemide  (LASIX ) 20 MG tablet [id number]  Take 1  tablet (20 mg total) by mouth daily as needed. For Fluid Pearlean Manus, MD  Active   HYDROcodone -acetaminophen  (NORCO/VICODIN) 5-325 MG tablet [id number]  Take 1 tablet by mouth every 6 (six) hours as needed for moderate pain (pain score 4-6). [provider]  Active Child, Self, Pharmacy Records  metoprolol  tartrate (LOPRESSOR ) 25 MG tablet [id number]  Take 1 tablet (25 mg  total) by mouth 2 (two) times daily. [name], FNP  Active Child, Self, Pharmacy Records  nystatin  (MYCOSTATIN /NYSTOP ) powder [id number]  Apply 1 Application topically 3 (three) times daily. [name], FNP  Active Child, Self, Pharmacy Records  pantoprazole  (PROTONIX ) 40 MG tablet [id number]  take 1 tablet (40 milligram total) by mouth daily. [name], FNP  Active   traZODone  (DESYREL ) 50 MG tablet [id number]  Take 1 tablet (50 mg total) by mouth at bedtime. [name], FNP  Active Child, Self, Pharmacy Records  triamcinolone  cream (KENALOG ) 0.1 % [id number]  Apply 1 Application topically 2 (two) times daily. [name], FNP  Active Child, Self, Pharmacy Records  Med List Note (Ward, Angelica, CPhT 10/22/23 1502): Eliquis  is [facility]            Goals Addressed             This Visit's Progress    VBCI Transitions of Care (TOC) Care Plan       Problems:  Recent Hospitalization for treatment of  cellulitus of rt lower extremity Knowledge Deficit Related to cellulitus of rt lower extremity Patient states doing well   no new concerns reported, pt not interested in daily weights, pt on oxygen  3 liters via [place],  reports taking all medications as prescribed, pt rescheduled renal ultrasound  Goal:  Over the next 30 days, the patient will not experience hospital readmission  Interventions:  Transitions of Care: Doctor Visits  - discussed the importance of doctor visits Reviewed all upcoming appointments Reinforced signs/ symptoms of infection, cellulitis Encouraged pt to elevate legs throughout the day Reinforced oxygen  safety Reinforced safety precautions and being careful with oxygen  tubing  Patient Self Care Activities:  Attend all scheduled provider appointments Call pharmacy for medication refills 3-7 days in advance of running out of medications Call provider office for new concerns or questions  Notify RN Care Manager of TOC call rescheduling  needs Participate in Transition of Care Program/Attend TOC scheduled calls Take medications as prescribed   Be careful with oxygen  tubing so you do not fall Elevate legs  Plan:  Renal ultrasound 11/19/23 @ 1130 am Telephone follow up appointment with care management team member scheduled for: 11/21/23 @ 1015 am        Recommendation:   PCP Follow-up Continue Current Plan of Care  Follow Up Plan:   Telephone follow-up 11/21/23 @ 1015 am  Mliss Creed [facility], BSN RN Care Manager/ Transition of Care [place]/ [facility] [phone]

## 2023-11-19 ENCOUNTER — Ambulatory Visit (HOSPITAL_COMMUNITY)
Admission: RE | Admit: 2023-11-19 | Discharge: 2023-11-19 | Disposition: A | Discharge status: Home / Self Care | Source: Ambulatory Visit | Attending: Nephrology | Admitting: Nephrology

## 2023-11-19 DIAGNOSIS — N1831 Chronic kidney disease, stage 3a: Secondary | ICD-10-CM | POA: Diagnosis not present

## 2023-11-20 ENCOUNTER — Telehealth: Payer: Self-pay

## 2023-11-20 NOTE — Telephone Encounter (Signed)
 Copied from CRM [id number]. Topic: General - Other >> Nov 20, 2023 10:45 AM Avram MATSU wrote: Reason for CRM: Daughter is calling asking if she can get a letter sent to her email stating that she is a primary care giver to her mother(pt) she needs to send the letter to her job Hr department   Callback if there are any [phone]

## 2023-11-21 ENCOUNTER — Encounter: Payer: Self-pay | Admitting: *Deleted

## 2023-11-21 ENCOUNTER — Telehealth: Payer: Self-pay | Admitting: *Deleted

## 2023-11-21 ENCOUNTER — Telehealth: Payer: Self-pay

## 2023-11-21 DIAGNOSIS — M170 Bilateral primary osteoarthritis of knee: Secondary | ICD-10-CM | POA: Diagnosis not present

## 2023-11-21 DIAGNOSIS — E6609 Other obesity due to excess calories: Secondary | ICD-10-CM | POA: Diagnosis not present

## 2023-11-21 DIAGNOSIS — Z79899 Other long term (current) drug therapy: Secondary | ICD-10-CM | POA: Diagnosis not present

## 2023-11-21 NOTE — Telephone Encounter (Signed)
 Copied from CRM [id number]. Topic: General - Other >> Nov 20, 2023 10:45 AM Avram MATSU wrote: Reason for CRM: Daughter is calling asking if she can get a letter sent to her email stating that she is a primary care giver to her mother(pt) she needs to send the letter to her job Hr department   Callback if there are any questions:[phone]

## 2023-11-21 NOTE — Patient Outreach (Signed)
 11/21/2023  Patient ID: Denise Underwood, female   DOB: November 25, 1945, 78 y.o.   MRN: [id number]  Patient's daughter answered the phone and states pt is on the way to an appointment, is unable to talk, requests call next week.  Call scheduled for 11/25/23 @ 1030 am  Mliss Creed [facility], BSN RN Care Manager/ Transition of Care [place]/ [facility] [phone]

## 2023-11-21 NOTE — Telephone Encounter (Signed)
 Letter written and scanned to daughters e-mail [email or web address].

## 2023-11-24 ENCOUNTER — Telehealth: Payer: Self-pay | Admitting: Family Medicine

## 2023-11-24 NOTE — Telephone Encounter (Signed)
 Copied from CRM [id number]. Topic: General - Other >> Nov 20, 2023 10:45 AM Avram MATSU wrote: Reason for CRM: Daughter is calling asking if she can get a letter sent to her email stating that she is a primary care giver to her mother(pt) she needs to send the letter to her job Hr department   Callback if there are any questions:[phone] >> Nov 20, 2023  5:10 PM [place] J wrote: Patient's daughter returning a missed phone call. I let her know that Rock Bruns was in agreement to the email being sent. She would like to know a turn around time for the email to be sent.

## 2023-11-24 NOTE — Telephone Encounter (Signed)
 Letter written, printed, and scanned to daughters e-mail

## 2023-11-25 ENCOUNTER — Other Ambulatory Visit: Payer: Self-pay | Admitting: *Deleted

## 2023-11-25 NOTE — Patient Outreach (Signed)
 Transition of Care week 5  Visit Note  11/25/2023  Name: Denise Underwood MRN: [id number]          DOB: [birth year]  Situation: Patient enrolled in [facility] 30-day program. Visit completed with patient by telephone.   Background:    Past Medical History:  Diagnosis Date   Anxiety    Arthritis    Hyperlipidemia    Hypertension    Osteopenia 07/04/2021   Prediabetes 07/09/2021   Psoriasis     Assessment: Patient Reported Symptoms: Cognitive Cognitive Status: No symptoms reported, Able to follow simple commands, Normal speech and language skills      Neurological Neurological Review of Symptoms: No symptoms reported    HEENT HEENT Symptoms Reported: No symptoms reported      Cardiovascular Cardiovascular Symptoms Reported: No symptoms reported Does patient have uncontrolled Hypertension?: No Cardiovascular Comment: HF-  pt does not weigh daily, not interested,  reviewed HF action plan  Respiratory Respiratory Symptoms Reported: No symptoms reported    Endocrine Endocrine Symptoms Reported: No symptoms reported    Gastrointestinal Gastrointestinal Symptoms Reported: No symptoms reported      Genitourinary Genitourinary Symptoms Reported: No symptoms reported    Integumentary Integumentary Symptoms Reported: No symptoms reported Additional Integumentary Details: pt states cellulitis to RLE is resolved Skin Management Strategies: Routine screening Skin Self-Management Outcome: 4 (good) Skin Comment: pt reports she continues to elevate legs throughout the day, has slight edema (chronic), not interested in compression hose  Musculoskeletal Musculoskelatal Symptoms Reviewed: Difficulty walking, Unsteady gait Musculoskeletal Management Strategies: Medical device Musculoskeletal Self-Management Outcome: 4 (good)      Psychosocial Psychosocial Symptoms Reported: No symptoms reported         There were no vitals filed for this visit.  Medications Reviewed Today     Reviewed  by Aura Mliss LABOR, RN (Registered Nurse) on 11/25/23 at 1032  Med List Status: <None>   Medication Order Taking? Sig Documenting Provider Last Dose Status Informant  acetaminophen  (TYLENOL ) 650 MG CR tablet [id number]  Take 650 mg by mouth every 8 (eight) hours as needed for pain. [provider]  Active Child, Self, Pharmacy Records  albuterol  (VENTOLIN  HFA) 108 [phone] Base) MCG/ACT inhaler [id number]  Inhale 2 puffs into the lungs every 6 (six) hours as needed for wheezing or shortness of breath. Pearlean Manus, MD  Active   apixaban  (ELIQUIS ) 5 MG TABS tablet [id number]  Take 1 tablet (5 mg total) by mouth 2 (two) times daily. Pearlean Manus, MD  Active   CALCIUM  PO [id number]  Take 1 tablet by mouth daily. [provider]  Active Child, Self, Pharmacy Records  Cholecalciferol (VITAMIN D3) 1000 units CAPS [id number]  Take 2,000 Units by mouth. [provider]  Active Child, Self, Pharmacy Records           Med Note JACKOLYN WADDELL DEL   Tue Feb 04, 2023  9:56 AM)    diltiazem  (CARTIA  XT) 180 MG 24 hr capsule [id number]  take 1 capsule (180 milligram total) by mouth daily. [name], FNP  Active   Emollient [facility] DIABETICS DRY SKIN) CREA [id number]  Apply 1 Application topically 2 (two) times daily. [name], FNP  Active Child, Self, Pharmacy Records  escitalopram  (LEXAPRO ) 20 MG tablet [id number]  Take 1 tablet (20 mg total) by mouth daily. [name], FNP  Active Child, Self, Pharmacy Records  fluticasone -salmeterol (ADVAIR) 250-50 MCG/ACT AEPB [id number]  inhale 1 puff into the lungs in the morning and at  bedtime. [name], FNP  Active   furosemide  (LASIX ) 20 MG tablet [id number]  Take 1 tablet (20 mg total) by mouth daily as needed. For Fluid Pearlean Manus, MD  Active   HYDROcodone -acetaminophen  (NORCO/VICODIN) 5-325 MG tablet [id number]  Take 1 tablet by mouth every 6 (six) hours as needed for moderate pain (pain score 4-6). [provider]   Active Child, Self, Pharmacy Records  metoprolol  tartrate (LOPRESSOR ) 25 MG tablet [id number]  Take 1 tablet (25 mg total) by mouth 2 (two) times daily. [name], FNP  Active Child, Self, Pharmacy Records  nystatin  (MYCOSTATIN /NYSTOP ) powder [id number]  Apply 1 Application topically 3 (three) times daily. [name], FNP  Active Child, Self, Pharmacy Records  pantoprazole  (PROTONIX ) 40 MG tablet [id number]  take 1 tablet (40 milligram total) by mouth daily. [name], FNP  Active   traZODone  (DESYREL ) 50 MG tablet [id number]  Take 1 tablet (50 mg total) by mouth at bedtime. [name], FNP  Active Child, Self, Pharmacy Records  triamcinolone  cream (KENALOG ) 0.1 % [id number]  Apply 1 Application topically 2 (two) times daily. [name], FNP  Active Child, Self, Pharmacy Records  Med List Note (Ward, Angelica, CPhT 10/22/23 1502): Eliquis  is at [facility]            Recommendation:   PCP Follow-up  Follow Up Plan:   Closing From:  Transitions of Care Program  Mliss Creed [facility], BSN RN Care Manager/ Transition of Care [place]/ [facility] [phone]

## 2023-11-25 NOTE — Patient Instructions (Signed)
 Visit Information  Thank you for taking time to visit with me today. Please don't hesitate to contact me if I can be of assistance to you before our next scheduled telephone appointment.  Our next appointment is no further scheduled appointments.    Following is a copy of your care plan:   Goals Addressed             This Visit's Progress    COMPLETED: [facility] Transitions of Care (TOC) Care Plan       Problems:  Recent Hospitalization for treatment of  cellulitus of rt lower extremity Knowledge Deficit Related to cellulitus of rt lower extremity Patient states doing good   no new concerns reported, pt not interested in daily weights, pt on oxygen  3 liters via [place],  reports taking all medications as prescribed, states cellulitis remains resolved, pt is elevating legs, pt reports she recently followed up with nephrologist and pain management, pt feels she is doing well and goals have been met  Goal:  Over the next 30 days, the patient will not experience hospital readmission  Interventions:  Transitions of Care: Doctor Visits  - discussed the importance of doctor visits Reviewed all upcoming appointments Reviewed signs/ symptoms of infection, cellulitis Encouraged pt to elevate legs throughout the day Reviewed oxygen  safety Reviewed safety precautions and being careful with oxygen  tubing Pain assessment completed  Patient Self Care Activities:  Attend all scheduled provider appointments Call pharmacy for medication refills 3-7 days in advance of running out of medications Call provider office for new concerns or questions  Notify RN Care Manager of TOC call rescheduling needs Participate in Transition of Care Program/Attend TOC scheduled calls Take medications as prescribed   Be careful with oxygen  tubing so you do not fall Elevate legs Call your doctor early on for change in health status/ symptoms  Plan: No further follow up needed, case closure        Patient  verbalizes understanding of instructions and care plan provided today and agrees to view in MyChart. Active MyChart status and patient understanding of how to access instructions and care plan via MyChart confirmed with patient.     No further follow up required: case closure  Please call the care guide team at [phone] if you need to cancel or reschedule your appointment.   Please call the Suicide and Crisis Lifeline: 988 call the [place]  National Suicide Prevention Lifeline: (628) 810-5200 or TTY: 737-716-1105 TTY (306)823-0988) to talk to a trained counselor call 1-800-273-TALK (toll free, 24 hour hotline) go to [facility] [address], [place] [phone]) call the [facility] Crisis Line: [phone] call 911 if you are experiencing a Mental Health or Behavioral Health Crisis or need someone to talk to.  Mliss Creed [facility], BSN RN Care Manager/ Transition of Care [place]/ [facility] [phone]

## 2023-11-27 DIAGNOSIS — Z79899 Other long term (current) drug therapy: Secondary | ICD-10-CM | POA: Diagnosis not present

## 2023-12-02 ENCOUNTER — Other Ambulatory Visit: Payer: Self-pay | Admitting: Family Medicine

## 2023-12-13 DIAGNOSIS — J441 Chronic obstructive pulmonary disease with (acute) exacerbation: Secondary | ICD-10-CM | POA: Diagnosis not present

## 2023-12-13 DIAGNOSIS — G9341 Metabolic encephalopathy: Secondary | ICD-10-CM | POA: Diagnosis not present

## 2023-12-13 DIAGNOSIS — I5033 Acute on chronic diastolic (congestive) heart failure: Secondary | ICD-10-CM | POA: Diagnosis not present

## 2023-12-13 DIAGNOSIS — I4891 Unspecified atrial fibrillation: Secondary | ICD-10-CM | POA: Diagnosis not present

## 2023-12-24 DIAGNOSIS — M170 Bilateral primary osteoarthritis of knee: Secondary | ICD-10-CM | POA: Diagnosis not present

## 2023-12-24 DIAGNOSIS — Z79899 Other long term (current) drug therapy: Secondary | ICD-10-CM | POA: Diagnosis not present

## 2023-12-24 DIAGNOSIS — E6609 Other obesity due to excess calories: Secondary | ICD-10-CM | POA: Diagnosis not present

## 2023-12-24 DIAGNOSIS — M7989 Other specified soft tissue disorders: Secondary | ICD-10-CM | POA: Diagnosis not present

## 2024-01-13 DIAGNOSIS — G9341 Metabolic encephalopathy: Secondary | ICD-10-CM | POA: Diagnosis not present

## 2024-01-13 DIAGNOSIS — J441 Chronic obstructive pulmonary disease with (acute) exacerbation: Secondary | ICD-10-CM | POA: Diagnosis not present

## 2024-01-13 DIAGNOSIS — I4891 Unspecified atrial fibrillation: Secondary | ICD-10-CM | POA: Diagnosis not present

## 2024-01-13 DIAGNOSIS — I5033 Acute on chronic diastolic (congestive) heart failure: Secondary | ICD-10-CM | POA: Diagnosis not present

## 2024-01-23 DIAGNOSIS — E6609 Other obesity due to excess calories: Secondary | ICD-10-CM | POA: Diagnosis not present

## 2024-01-23 DIAGNOSIS — M170 Bilateral primary osteoarthritis of knee: Secondary | ICD-10-CM | POA: Diagnosis not present

## 2024-01-23 DIAGNOSIS — Z79899 Other long term (current) drug therapy: Secondary | ICD-10-CM | POA: Diagnosis not present

## 2024-01-23 DIAGNOSIS — L409 Psoriasis, unspecified: Secondary | ICD-10-CM | POA: Diagnosis not present

## 2024-01-24 ENCOUNTER — Encounter (HOSPITAL_COMMUNITY): Payer: Self-pay | Admitting: Emergency Medicine

## 2024-01-24 ENCOUNTER — Other Ambulatory Visit: Payer: Self-pay

## 2024-01-24 ENCOUNTER — Inpatient Hospital Stay (HOSPITAL_COMMUNITY)
Admission: EM | Admit: 2024-01-24 | Discharge: 2024-01-28 | DRG: 596 | Disposition: A | Discharge status: Other Acute Inpatient Hospital | Attending: Internal Medicine | Admitting: Internal Medicine

## 2024-01-24 ENCOUNTER — Emergency Department (HOSPITAL_COMMUNITY)

## 2024-01-24 DIAGNOSIS — L400 Psoriasis vulgaris: Secondary | ICD-10-CM | POA: Diagnosis not present

## 2024-01-24 DIAGNOSIS — L409 Psoriasis, unspecified: Secondary | ICD-10-CM | POA: Diagnosis not present

## 2024-01-24 DIAGNOSIS — I700 Atherosclerosis of aorta: Secondary | ICD-10-CM | POA: Diagnosis not present

## 2024-01-24 DIAGNOSIS — I959 Hypotension, unspecified: Secondary | ICD-10-CM | POA: Diagnosis not present

## 2024-01-24 DIAGNOSIS — E66811 Obesity, class 1: Secondary | ICD-10-CM | POA: Diagnosis not present

## 2024-01-24 DIAGNOSIS — Z6834 Body mass index (BMI) 34.0-34.9, adult: Secondary | ICD-10-CM | POA: Diagnosis not present

## 2024-01-24 DIAGNOSIS — R6510 Systemic inflammatory response syndrome (SIRS) of non-infectious origin without acute organ dysfunction: Principal | ICD-10-CM | POA: Diagnosis present

## 2024-01-24 DIAGNOSIS — E785 Hyperlipidemia, unspecified: Secondary | ICD-10-CM | POA: Diagnosis not present

## 2024-01-24 DIAGNOSIS — J9611 Chronic respiratory failure with hypoxia: Secondary | ICD-10-CM | POA: Diagnosis present

## 2024-01-24 DIAGNOSIS — R21 Rash and other nonspecific skin eruption: Secondary | ICD-10-CM | POA: Diagnosis not present

## 2024-01-24 DIAGNOSIS — B369 Superficial mycosis, unspecified: Secondary | ICD-10-CM | POA: Diagnosis present

## 2024-01-24 DIAGNOSIS — Z7901 Long term (current) use of anticoagulants: Secondary | ICD-10-CM

## 2024-01-24 DIAGNOSIS — N179 Acute kidney failure, unspecified: Secondary | ICD-10-CM | POA: Diagnosis not present

## 2024-01-24 DIAGNOSIS — N1831 Chronic kidney disease, stage 3a: Secondary | ICD-10-CM | POA: Diagnosis present

## 2024-01-24 DIAGNOSIS — J449 Chronic obstructive pulmonary disease, unspecified: Secondary | ICD-10-CM | POA: Diagnosis present

## 2024-01-24 DIAGNOSIS — G4700 Insomnia, unspecified: Secondary | ICD-10-CM | POA: Diagnosis not present

## 2024-01-24 DIAGNOSIS — Z7951 Long term (current) use of inhaled steroids: Secondary | ICD-10-CM | POA: Diagnosis not present

## 2024-01-24 DIAGNOSIS — Z79899 Other long term (current) drug therapy: Secondary | ICD-10-CM | POA: Diagnosis not present

## 2024-01-24 DIAGNOSIS — A419 Sepsis, unspecified organism: Principal | ICD-10-CM | POA: Diagnosis present

## 2024-01-24 DIAGNOSIS — Z87891 Personal history of nicotine dependence: Secondary | ICD-10-CM | POA: Diagnosis not present

## 2024-01-24 DIAGNOSIS — D72829 Elevated white blood cell count, unspecified: Secondary | ICD-10-CM | POA: Diagnosis not present

## 2024-01-24 DIAGNOSIS — I480 Paroxysmal atrial fibrillation: Secondary | ICD-10-CM | POA: Diagnosis present

## 2024-01-24 DIAGNOSIS — F419 Anxiety disorder, unspecified: Secondary | ICD-10-CM | POA: Diagnosis present

## 2024-01-24 DIAGNOSIS — I130 Hypertensive heart and chronic kidney disease with heart failure and stage 1 through stage 4 chronic kidney disease, or unspecified chronic kidney disease: Secondary | ICD-10-CM | POA: Diagnosis present

## 2024-01-24 DIAGNOSIS — I5032 Chronic diastolic (congestive) heart failure: Secondary | ICD-10-CM | POA: Diagnosis not present

## 2024-01-24 DIAGNOSIS — F32A Depression, unspecified: Secondary | ICD-10-CM | POA: Diagnosis present

## 2024-01-24 LAB — CBC WITH DIFFERENTIAL/PLATELET
Abs Immature Granulocytes: 0.28 K/uL — ABNORMAL HIGH (ref 0.00–0.07)
Basophils Absolute: 0.1 K/uL (ref 0.0–0.1)
Basophils Relative: 0 %
Eosinophils Absolute: 0.9 K/uL — ABNORMAL HIGH (ref 0.0–0.5)
Eosinophils Relative: 4 %
HCT: 31.6 % — ABNORMAL LOW (ref 36.0–46.0)
Hemoglobin: 10 g/dL — ABNORMAL LOW (ref 12.0–15.0)
Immature Granulocytes: 1 %
Lymphocytes Relative: 11 %
Lymphs Abs: 2.5 K/uL (ref 0.7–4.0)
MCH: 32.9 pg (ref 26.0–34.0)
MCHC: 31.6 g/dL (ref 30.0–36.0)
MCV: 103.9 fL — ABNORMAL HIGH (ref 80.0–100.0)
Monocytes Absolute: 1.8 K/uL — ABNORMAL HIGH (ref 0.1–1.0)
Monocytes Relative: 8 %
Neutro Abs: 17.3 K/uL — ABNORMAL HIGH (ref 1.7–7.7)
Neutrophils Relative %: 76 %
Platelets: 341 K/uL (ref 150–400)
RBC: 3.04 MIL/uL — ABNORMAL LOW (ref 3.87–5.11)
RDW: 14.1 % (ref 11.5–15.5)
WBC: 22.9 K/uL — ABNORMAL HIGH (ref 4.0–10.5)
nRBC: 0 % (ref 0.0–0.2)

## 2024-01-24 LAB — BASIC METABOLIC PANEL WITH GFR
Anion gap: 13 (ref 5–15)
BUN: 45 mg/dL — ABNORMAL HIGH (ref 8–23)
CO2: 25 mmol/L (ref 22–32)
Calcium: 8.5 mg/dL — ABNORMAL LOW (ref 8.9–10.3)
Chloride: 102 mmol/L (ref 98–111)
Creatinine, Ser: 2.38 mg/dL — ABNORMAL HIGH (ref 0.44–1.00)
GFR, Estimated: 20 mL/min — ABNORMAL LOW (ref >60)
Glucose, Bld: 123 mg/dL — ABNORMAL HIGH (ref 70–99)
Potassium: 4.7 mmol/L (ref 3.5–5.1)
Sodium: 140 mmol/L (ref 135–145)

## 2024-01-24 LAB — URINALYSIS, ROUTINE W REFLEX MICROSCOPIC
Bacteria, UA: NONE SEEN
Bilirubin Urine: NEGATIVE
Glucose, UA: NEGATIVE mg/dL
Hgb urine dipstick: NEGATIVE
Ketones, ur: NEGATIVE mg/dL
Nitrite: NEGATIVE
Protein, ur: NEGATIVE mg/dL
Specific Gravity, Urine: 1.024 (ref 1.005–1.030)
pH: 5 (ref 5.0–8.0)

## 2024-01-24 LAB — SEDIMENTATION RATE: Sed Rate: 33 mm/h — ABNORMAL HIGH (ref 0–30)

## 2024-01-24 LAB — TSH: TSH: 1.849 u[IU]/mL (ref 0.350–4.500)

## 2024-01-24 LAB — C-REACTIVE PROTEIN: CRP: 14.9 mg/dL — ABNORMAL HIGH (ref <1.0)

## 2024-01-24 LAB — PROCALCITONIN: Procalcitonin: <0.1 ng/mL

## 2024-01-24 LAB — PHOSPHORUS: Phosphorus: 4.1 mg/dL (ref 2.5–4.6)

## 2024-01-24 LAB — MAGNESIUM: Magnesium: 1.5 mg/dL — ABNORMAL LOW (ref 1.7–2.4)

## 2024-01-24 LAB — LACTIC ACID, PLASMA
Lactic Acid, Venous: 3 mmol/L (ref 0.5–1.9)
Lactic Acid, Venous: 2.8 mmol/L (ref 0.5–1.9)

## 2024-01-24 MED ORDER — CLOBETASOL PROPIONATE 0.05 % EX CREA
TOPICAL_CREAM | Freq: Two times a day (BID) | CUTANEOUS | Status: DC
  Administered 2024-01-25 – 2024-01-26 (×2): 1 via TOPICAL
  Filled 2024-01-24 (×2): qty 15

## 2024-01-24 MED ORDER — ACETAMINOPHEN 650 MG RE SUPP: 650.0000 mg | Freq: Four times a day (QID) | RECTAL | Status: DC | PRN

## 2024-01-24 MED ORDER — LACTATED RINGERS IV BOLUS (SEPSIS)
1000.0000 mL | Freq: Once | INTRAVENOUS | Status: AC
  Administered 2024-01-24: 1000 mL via INTRAVENOUS

## 2024-01-24 MED ORDER — METOPROLOL TARTRATE 25 MG PO TABS
25.0000 mg | ORAL_TABLET | Freq: Two times a day (BID) | ORAL | Status: DC
Start: 2024-01-24 — End: 2024-01-27
  Administered 2024-01-24 – 2024-01-27 (×5): 25 mg via ORAL
  Filled 2024-01-24 (×5): qty 1

## 2024-01-24 MED ORDER — NYSTATIN 100000 UNIT/GM EX POWD: Freq: Three times a day (TID) | CUTANEOUS | Status: DC

## 2024-01-24 MED ORDER — FLUTICASONE FUROATE-VILANTEROL 200-25 MCG/ACT IN AEPB
1.0000 | INHALATION_SPRAY | Freq: Every day | RESPIRATORY_TRACT | Status: DC
  Administered 2024-01-25 – 2024-01-28 (×4): 1 via RESPIRATORY_TRACT
  Filled 2024-01-24: qty 28

## 2024-01-24 MED ORDER — APIXABAN 5 MG PO TABS
5.0000 mg | ORAL_TABLET | Freq: Two times a day (BID) | ORAL | Status: DC
  Administered 2024-01-24 – 2024-01-27 (×7): 5 mg via ORAL
  Filled 2024-01-24 (×7): qty 1

## 2024-01-24 MED ORDER — FLUCONAZOLE IN SODIUM CHLORIDE 200-0.9 MG/100ML-% IV SOLN
200.0000 mg | INTRAVENOUS | Status: AC
  Administered 2024-01-24 – 2024-01-26 (×3): 200 mg via INTRAVENOUS
  Filled 2024-01-24 (×3): qty 100

## 2024-01-24 MED ORDER — MIDODRINE HCL 5 MG PO TABS
5.0000 mg | ORAL_TABLET | Freq: Three times a day (TID) | ORAL | Status: DC
Start: 2024-01-24 — End: 2024-01-26
  Administered 2024-01-24 – 2024-01-26 (×5): 5 mg via ORAL
  Filled 2024-01-24 (×4): qty 1

## 2024-01-24 MED ORDER — NOREPINEPHRINE 4 MG/250ML-% IV SOLN
0.0000 ug/min | INTRAVENOUS | Status: DC
  Filled 2024-01-24: qty 250

## 2024-01-24 MED ORDER — ORAL CARE MOUTH RINSE: 15.0000 mL | OROMUCOSAL | Status: DC | PRN

## 2024-01-24 MED ORDER — SODIUM CHLORIDE 0.9 % IV SOLN: 250.0000 mL | INTRAVENOUS | Status: AC

## 2024-01-24 MED ORDER — VANCOMYCIN HCL 1500 MG/300ML IV SOLN
1500.0000 mg | Freq: Once | INTRAVENOUS | Status: AC
  Administered 2024-01-24: 1500 mg via INTRAVENOUS
  Filled 2024-01-24: qty 300

## 2024-01-24 MED ORDER — LACTATED RINGERS IV SOLN: INTRAVENOUS | Status: AC

## 2024-01-24 MED ORDER — LACTATED RINGERS IV BOLUS (SEPSIS)
500.0000 mL | Freq: Once | INTRAVENOUS | Status: AC
  Administered 2024-01-24: 500 mL via INTRAVENOUS

## 2024-01-24 MED ORDER — SODIUM CHLORIDE 0.9 % IV SOLN
2.0000 g | Freq: Once | INTRAVENOUS | Status: AC
  Administered 2024-01-24: 2 g via INTRAVENOUS
  Filled 2024-01-24: qty 12.5

## 2024-01-24 MED ORDER — TRAMADOL HCL 50 MG PO TABS
50.0000 mg | ORAL_TABLET | Freq: Three times a day (TID) | ORAL | Status: DC | PRN
  Administered 2024-01-25 – 2024-01-28 (×7): 50 mg via ORAL
  Filled 2024-01-24 (×7): qty 1

## 2024-01-24 MED ORDER — HYALURONIDASE HUMAN 150 UNIT/ML IJ SOLN
150.0000 [IU] | Freq: Once | INTRAMUSCULAR | Status: AC
  Administered 2024-01-24: 150 [IU] via SUBCUTANEOUS
  Filled 2024-01-24: qty 1

## 2024-01-24 MED ORDER — LACTATED RINGERS IV BOLUS
1000.0000 mL | Freq: Once | INTRAVENOUS | Status: AC
  Administered 2024-01-24: 1000 mL via INTRAVENOUS

## 2024-01-24 MED ORDER — NYSTATIN 100000 UNIT/GM EX POWD
Freq: Three times a day (TID) | CUTANEOUS | Status: DC
  Filled 2024-01-24 (×2): qty 15

## 2024-01-24 MED ORDER — ACETAMINOPHEN 325 MG PO TABS
650.0000 mg | ORAL_TABLET | Freq: Four times a day (QID) | ORAL | Status: DC | PRN
  Administered 2024-01-24 – 2024-01-27 (×5): 650 mg via ORAL
  Filled 2024-01-24 (×5): qty 2

## 2024-01-24 MED ORDER — ESCITALOPRAM OXALATE 10 MG PO TABS
20.0000 mg | ORAL_TABLET | Freq: Every day | ORAL | Status: DC
  Administered 2024-01-25 – 2024-01-27 (×3): 20 mg via ORAL
  Filled 2024-01-24 (×3): qty 2

## 2024-01-24 MED ORDER — MAGNESIUM SULFATE IN D5W 1-5 GM/100ML-% IV SOLN
1.0000 g | Freq: Once | INTRAVENOUS | Status: AC
Start: 2024-01-24 — End: 2024-01-24
  Administered 2024-01-24: 1 g via INTRAVENOUS
  Filled 2024-01-24: qty 100

## 2024-01-24 MED ORDER — VANCOMYCIN HCL IN DEXTROSE 1-5 GM/200ML-% IV SOLN
1000.0000 mg | Freq: Once | INTRAVENOUS | Status: DC
  Filled 2024-01-24: qty 200

## 2024-01-24 MED ORDER — CHLORHEXIDINE GLUCONATE CLOTH 2 % EX PADS
6.0000 | MEDICATED_PAD | Freq: Every day | CUTANEOUS | Status: DC
  Administered 2024-01-25 – 2024-01-28 (×4): 6 via TOPICAL

## 2024-01-24 MED ORDER — IPRATROPIUM-ALBUTEROL 0.5-2.5 (3) MG/3ML IN SOLN: 3.0000 mL | Freq: Four times a day (QID) | RESPIRATORY_TRACT | Status: DC | PRN

## 2024-01-24 MED ORDER — ONDANSETRON HCL 4 MG PO TABS: 4.0000 mg | ORAL_TABLET | Freq: Four times a day (QID) | ORAL | Status: DC | PRN

## 2024-01-24 MED ORDER — TRAZODONE HCL 50 MG PO TABS
50.0000 mg | ORAL_TABLET | Freq: Every evening | ORAL | Status: DC | PRN
  Administered 2024-01-25 – 2024-01-27 (×3): 50 mg via ORAL
  Filled 2024-01-24 (×3): qty 1

## 2024-01-24 MED ORDER — ONDANSETRON HCL 4 MG/2ML IJ SOLN: 4.0000 mg | Freq: Four times a day (QID) | INTRAMUSCULAR | Status: DC | PRN

## 2024-01-24 MED ORDER — PANTOPRAZOLE SODIUM 40 MG PO TBEC
40.0000 mg | DELAYED_RELEASE_TABLET | Freq: Every day | ORAL | Status: DC
  Administered 2024-01-24 – 2024-01-27 (×4): 40 mg via ORAL
  Filled 2024-01-24 (×4): qty 1

## 2024-01-24 NOTE — ED Triage Notes (Signed)
 Bilaterals hands cracked open and sore and psoriasis rash for the past couple of weeks.

## 2024-01-24 NOTE — ED Notes (Signed)
 RN went to hang 500mL fluid bolus and found that 14ga ultrasound IV had infiltrated. Oncoming RN at bedside to insert new PIV. Pt denies pain in the affected arm and states that she did not feel that it was swollen; education provided. Pt's right proximal forearm and upper arm are significantly swollen compared to the left; tissue is soft. Will notify pharmacy as vancomycin  had been infusing along with LR in the affected IV.

## 2024-01-24 NOTE — ED Notes (Signed)
 Attempted PIV insertion x 2 unsuccessful; requested ultrasound IV insertion.

## 2024-01-24 NOTE — Plan of Care (Signed)

## 2024-01-24 NOTE — ED Notes (Signed)
 Spoke with pharmacist Dempsey who advises elevating and applying a cold compress to right arm. He will order meds as a precaution and oncoming RN will continue to monitor.

## 2024-01-24 NOTE — ED Notes (Signed)
 Pt brought back to room, placed pt on monitor and O2 sat noted to be 86% on room air, pt states she uses PRN oxygen  at home at 3L, pt placed on 2L [place] and sats up to 95%. RN notified

## 2024-01-24 NOTE — ED Provider Notes (Signed)
 [place] EM[facility]GENC[facility] DEPARTMENT AT [facility] Provider Note   CSN: [id number] Arrival date & time: 01/24/24  0848     Patient presents with: Skin Problem   Denise Underwood is a 78 y.o. female.  She has a history of hypertension, hyperlipidemia, psoriasis, major depressive disorder, A-fib, diastolic CHF, COPD and CKD.  She presents with 2 weeks of grossly worsening rash covering her whole body including her palms.  She states last time she is in the hospital she was told she would need to follow-up with a dermatologist but had not been able to yet.  She denies any recent antibiotics, unsure of any other new medications.  She denies fever or chills, dizziness but states she feels overall fatigued.  Denies abdominal pain nausea or vomiting, denies urinary symptoms, denies fever or chills.   HPI     Prior to Admission medications   Medication Sig Start Date End Date Taking? Authorizing Provider  acetaminophen  (TYLENOL ) 650 MG CR tablet Take 650 mg by mouth every 8 (eight) hours as needed for pain.    [provider]  albuterol  (VENTOLIN  HFA) 108 (90 Base) MCG/ACT inhaler Inhale 2 puffs into the lungs every 6 (six) hours as needed for wheezing or shortness of breath. 10/28/23   Pearlean Manus, MD  apixaban  (ELIQUIS ) 5 MG TABS tablet Take 1 tablet (5 mg total) by mouth 2 (two) times daily. 10/28/23   Pearlean Manus, MD  CALCIUM  PO Take 1 tablet by mouth daily.    [provider]  Cholecalciferol (VITAMIN D3) 1000 units CAPS Take 2,000 Units by mouth.    [provider]  diltiazem  (CARTIA  XT) 180 MG 24 hr capsule take 1 capsule (180 milligram total) by mouth daily. 12/03/23   [name], FNP  Emollient ([facility] DIABETICS DRY SKIN) CREA Apply 1 Application topically 2 (two) times daily. 06/19/22   [name], FNP  escitalopram  (LEXAPRO ) 20 MG tablet Take 1 tablet (20 mg total) by mouth daily. 10/14/23   [name], FNP  fluticasone -salmeterol  (ADVAIR) 250-50 MCG/ACT AEPB inhale 1 puff into the lungs in the morning and at bedtime. 12/03/23   [name], FNP  furosemide  (LASIX ) 20 MG tablet Take 1 tablet (20 mg total) by mouth daily as needed. For Fluid 10/28/23   Pearlean Manus, MD  HYDROcodone -acetaminophen  (NORCO/VICODIN) 5-325 MG tablet Take 1 tablet by mouth every 6 (six) hours as needed for moderate pain (pain score 4-6).    [provider]  metoprolol  tartrate (LOPRESSOR ) 25 MG tablet Take 1 tablet (25 mg total) by mouth 2 (two) times daily. 10/14/23   [name], FNP  nystatin  (MYCOSTATIN /NYSTOP ) powder Apply 1 Application topically 3 (three) times daily. 10/14/23   [name], FNP  pantoprazole  (PROTONIX ) 40 MG tablet take 1 tablet (40 milligram total) by mouth daily. 10/29/23   [name], FNP  traZODone  (DESYREL ) 50 MG tablet Take 1 tablet (50 mg total) by mouth at bedtime. 10/14/23   [name], FNP  triamcinolone  cream (KENALOG ) 0.1 % Apply 1 Application topically 2 (two) times daily. 10/14/23   [name], FNP    Allergies: Patient has no known allergies.    Review of Systems  Updated Vital Signs BP (!) 84/47 (BP Location: Right Arm)   Pulse 72   Temp 98.2 F (36.8 C) (Oral)   Resp 16   SpO2 90%   Physical Exam Vitals and nursing note reviewed.  Constitutional:  General: She is not in acute distress.    Appearance: She is well-developed.  HENT:     Head: Normocephalic and atraumatic.     Mouth/Throat:     Mouth: Mucous membranes are moist.  Eyes:     Extraocular Movements: Extraocular movements intact.     Conjunctiva/sclera: Conjunctivae normal.     Pupils: Pupils are equal, round, and reactive to light.  Cardiovascular:     Rate and Rhythm: Normal rate and regular rhythm.     Heart sounds: No murmur heard. Pulmonary:     Effort: Pulmonary effort is normal. No respiratory distress.     Breath sounds: Normal breath sounds.  Abdominal:     Palpations: Abdomen is soft.      Tenderness: There is no abdominal tenderness.  Musculoskeletal:        General: No swelling.     Cervical back: Neck supple.  Skin:    General: Skin is warm and dry.     Capillary Refill: Capillary refill takes less than 2 seconds.     Comments: Scaly body rash, peeling of skin of the palms.  The sacral and perineal area are beefy red with a thick white discharge.  No sacral ulcer.  No mucosal lesions.  Neurological:     General: No focal deficit present.     Mental Status: She is alert and oriented to person, place, and time.  Psychiatric:        Mood and Affect: Mood normal.          (all labs ordered are listed, but only abnormal results are displayed) Labs Reviewed - No data to display  EKG: None  Radiology: No results found.   .Critical Care  Performed by: Suellen Sherran LABOR, PA-C Authorized by: Suellen Sherran LABOR, PA-C   Critical care provider statement:    Critical care time (minutes):  30   Critical care was necessary to treat or prevent imminent or life-threatening deterioration of the following conditions:  Dehydration (Hypotension)   Critical care was time spent personally by me on the following activities:  Development of treatment plan with patient or surrogate, discussions with consultants, evaluation of patient's response to treatment, examination of patient, ordering and review of laboratory studies, ordering and review of radiographic studies, ordering and performing treatments and interventions, pulse oximetry, re-evaluation of patient's condition and review of old charts   Care discussed with: admitting provider     Care discussed with comment:  Call dermatology at [facility] Dr. Camie Brunswick    Medications Ordered in the ED - No data to display                                  Medical Decision Making This patient presents to the ED for concern of rash, this involves an extensive number of treatment options, and is a complaint that carries  with it a high risk of complications and morbidity.  The differential diagnosis includes psoriasis, erythroderma, contact dermatitis, SJS, TN, other   Co morbidities that complicate the patient evaluation : Diastolic heart failure, psoriasis   Additional history obtained:  Additional history obtained from EMR External records from outside source obtained and reviewed including prior notes and labs   Lab Tests:  I Ordered, and personally interpreted labs.  The pertinent results include: White blood cell count is 22.9 neutrophil 17.3, UA is normal, BMP with significant elevation in BUN and  creatinine from baseline.  Today's BUN/creatinine are 45 and 2.38 respectively as compared to 24 and 1.09 respectively 2 months ago   Imaging Studies ordered:  I ordered imaging studies including chest x-ray which shows no pulmonary edema or infiltrate I independently visualized and interpreted imaging within scope of identifying emergent findings  I agree with the radiologist interpretation   Cardiac Monitoring: / EKG:  The patient was maintained on a cardiac monitor.  I personally viewed and interpreted the cardiac monitored which showed an underlying rhythm of: Sinus rhythm   Consultations Obtained: Listed below in ED course   Problem List / ED Course / Critical interventions / Medication management  Presents with worsening rash x 2 weeks and is noted to be hypotensive, currently other complaints as the rash is feeling very fatigued for the past couple of days.  No fevers, chills, cough, abdominal pain, nausea or vomiting.  She has been maintaining adequate hydration at home she reports.  She has been rubbing alcohol  wipes all of her skin recently and thinks this has made the rash worse.  She has known psoriasis and uses topical creams but is not on any other medicine and has not followed up with dermatology.  Given her hypotension over several readings, she is on fluid resuscitation ordered  labs and blood cultures.  Patient had an elevated lactic acid, white blood cell count 22.9 though her white blood cell counts have been elevated over the past couple of months even after treatment for cellulitis in her leg.  She is noted to have an AKI further supporting likely volume depletion.  I did consider sepsis, I do not see any obvious source unless there is a superimposed bacterial infection of the skin though I do not know any specific area that appears to be more cellulitic than the other.  Patient was given empiric antibiotics due to her hypotension, lactic acidosis and leukocytosis.  She is not tachycardic though is on rate controlling medications for her A-fib.  She has a peeling skin of her hands and scaly lesions covering the rest of her body sparing her face and mucosal lesions, no involvement of her eyes.  Do not suspect SJS or TE N.  Do not suspect recommend spotted fever.  Patient needs consultation with dermatology which is not service we have here.  I spoke with Dr. Lauraine Brunswick at [facility].  Unfortunately they do not have any beds so patient was put on the wait list but she did currently give us  some guidance.  She states this could be many different causes including severe psoriasis flare, pityriasis rubra pilaris, or severe contact dermatitis from the alcohol  wipes patient has been using..  She states that psoriasis will respond to steroids whereas the pityriasis will not.  Recommended initiating topical clobetasol  twice a day for 2 to 3 days over all areas of psoriatic lesions.  Did mention that there is a good chance that if there is no cause infection that the leukocytosis is reactive.  if she is still here would transition to triamcinolone  after 2 to 3 days.  She states that if this is psoriasis that should drastically improve the appearance.   I discussed case with hospitalist Dr. Ricky who feels that her hypotension is likely due to combination of her medications and  dehydration and feels this is less likely to be a cause of infection and true sepsis.  He is agreeable with admitting patient to treat her hypotension and AKI and continue with  the topical treatments of her rash, will give Diflucan  for the yeast.  Patient is on wait list for [facility].   She does have 2 IVs in place, she did have one of her IVs initially infiltrate with vancomycin .  Pharmacy recommended subcutaneous Hylenex  which was administered by me and 5 0.2 mL injections at the site.  Patient tolerated this well.   I ordered medication including IV fluids for potential and Reevaluation of the patient after these medicines showed that the patient improved I have reviewed the patients home medicines and have made adjustments as needed       Amount and/or Complexity of Data Reviewed Labs: ordered. Radiology: ordered.  Risk Prescription drug management. Decision regarding hospitalization.        Final diagnoses:  None    ED Discharge Orders     None          Ranita Stjulien A, PA-C 01/24/24 1503    Francesca Elsie CROME, MD 01/25/24 [phone]

## 2024-01-24 NOTE — ED Notes (Signed)
 Informed oncoming RN of pt's repeat lactic acid 2.8, consistent with prior.

## 2024-01-24 NOTE — Progress Notes (Signed)
 Elink following for sepsis protocol.

## 2024-01-24 NOTE — ED Notes (Signed)
 Date and time results received: 01/24/24 11:21 AM  Test: Lactic acid Critical Value: 3.0  Name of Provider Notified: Sherran Barks  Orders Received? Or Actions Taken?: Awaiting orders

## 2024-01-24 NOTE — ED Notes (Signed)
 EDP made aware of pt's BP 84/47

## 2024-01-24 NOTE — ED Notes (Signed)
Notified provider of pt vital signs

## 2024-01-24 NOTE — H&P (Signed)
 History and Physical    Patient: Denise Underwood FMW:[id number] DOB: October 27, 1945 DOA: 01/24/2024 DOS: the patient was seen and examined on 01/24/2024 PCP: [name], FNP   Patient coming from: Home  Chief Complaint:  Chief Complaint  Patient presents with   Skin Problem   HPI: Denise Underwood is a 78 y.o. female with medical history significant of hypertension, paroxysmal atrial fibrillation on Eliquis , chronic diastolic heart failure, anxiety, chronic pain from arthritis, hyperlipidemia, prediabetes and psoriasis; who presented to the hospital secondary to worsening diffuse rash, itching, pain and weakness.  Symptoms has been present for the last 2 weeks and worsening.  Patient apparently has been using her home topical antifungal lotions and also alcohol  swabs to try to improve ongoing discomfort without success.  Express no fever, no chills, no chest pain, no productive coughing spells, no nausea, no vomiting, no abdominal discomfort, no dysuria/hematuria, no melena, no hematochezia, no sick contacts or any other complaints.  Workup in the ED demonstrating what appears to be diffuse psoriasis flare with concern for SIRS/early sepsis from possible superimposed cellulitic process.  Cultures taken and empiric antibiotics given.  Patient blood pressure was soft and responded to fluid resuscitation.  Acute kidney injury on chronic renal failure present at time of admission.  Initial plan for patient to be transferred to [facility] for dermatology service evaluation; unfortunately no current beds available.  [facility] contacted to assist with further management while awaiting transfer.  Dermatology recommendations at the moment is for topical steroids; while ruling out the presence of superimposed infection.  Review of Systems: As mentioned in the history of present illness. All other systems reviewed and are negative. Past Medical History:  Diagnosis Date   Anxiety    Arthritis    Hyperlipidemia     Hypertension    Osteopenia 07/04/2021   Prediabetes 07/09/2021   Psoriasis    Past Surgical History:  Procedure Laterality Date   ABDOMINAL HYSTERECTOMY     CHOLECYSTECTOMY     HERNIA REPAIR     KNEE SURGERY     TUBAL LIGATION     Social History:  reports that she has quit smoking. Her smoking use included cigarettes. She has a 20 pack-year smoking history. She has never used smokeless tobacco. She reports that she does not drink alcohol  and does not use drugs.  No Known Allergies  History reviewed. No pertinent family history.  Prior to Admission medications   Medication Sig Start Date End Date Taking? Authorizing Provider  albuterol  (VENTOLIN  HFA) 108 (90 Base) MCG/ACT inhaler Inhale 2 puffs into the lungs every 6 (six) hours as needed for wheezing or shortness of breath. 10/28/23  Yes Emokpae, Courage, MD  apixaban  (ELIQUIS ) 5 MG TABS tablet Take 1 tablet (5 mg total) by mouth 2 (two) times daily. 10/28/23  Yes Emokpae, Courage, MD  diltiazem  (CARTIA  XT) 180 MG 24 hr capsule take 1 capsule (180 milligram total) by mouth daily. Patient taking differently: Take 180 mg by mouth daily. 12/03/23  Yes [name], Rock HERO, FNP  escitalopram  (LEXAPRO ) 20 MG tablet Take 1 tablet (20 mg total) by mouth daily. 10/14/23  Yes [name], Rock HERO, FNP  fluticasone -salmeterol (ADVAIR) 250-50 MCG/ACT AEPB inhale 1 puff into the lungs in the morning and at bedtime. 12/03/23  Yes [name], Rock HERO, FNP  furosemide  (LASIX ) 20 MG tablet Take 1 tablet (20 mg total) by mouth daily as needed. For Fluid 10/28/23  Yes Emokpae, Courage, MD  HYDROcodone -acetaminophen  (NORCO/VICODIN) 5-325 MG tablet  Take 1 tablet by mouth every 6 (six) hours as needed for moderate pain (pain score 4-6).   Yes [provider]  lisinopril  (ZESTRIL ) 5 MG tablet Take 5 mg by mouth daily. 01/05/24  Yes [provider]  metoprolol  tartrate (LOPRESSOR ) 25 MG tablet Take 1 tablet (25 mg total) by mouth 2 (two) times daily. 10/14/23  Yes  [name], Rock HERO, FNP  pantoprazole  (PROTONIX ) 40 MG tablet take 1 tablet (40 milligram total) by mouth daily. Patient taking differently: Take 40 mg by mouth daily. 10/29/23  Yes [name], Rock HERO, FNP  traZODone  (DESYREL ) 50 MG tablet Take 1 tablet (50 mg total) by mouth at bedtime. 10/14/23  Yes [name], Rock HERO, FNP  acetaminophen  (TYLENOL ) 650 MG CR tablet Take 650 mg by mouth every 8 (eight) hours as needed for pain.    [provider]  CALCIUM  PO Take 1 tablet by mouth daily.    [provider]  Cholecalciferol (VITAMIN D3) 1000 units CAPS Take 2,000 Units by mouth.    [provider]  Emollient (CERAVE DIABETICS DRY SKIN) CREA Apply 1 Application topically 2 (two) times daily. 06/19/22   [name], FNP  nystatin  (MYCOSTATIN /NYSTOP ) powder Apply 1 Application topically 3 (three) times daily. 10/14/23   [name], FNP  triamcinolone  cream (KENALOG ) 0.1 % Apply 1 Application topically 2 (two) times daily. 10/14/23   [name], FNP    Physical Exam: Vitals:   01/24/24 1600 01/24/24 1615 01/24/24 1630 01/24/24 1709  BP: (!) 104/25 (!) 104/45 (!) 95/51 (!) 127/39  Pulse: 62 60 62 79  Resp: 18 15 16 16   Temp: (!) 97.5 F (36.4 C)     TempSrc: Oral     SpO2: 100% 100% 100% 97%  Weight: 87.9 kg     Height: 5' 4 (1.626 m)      General exam: Alert, awake, oriented x 3; following commands appropriately. Respiratory system: Good saturation on chronic 2 L supplementation.  No using accessory muscles. Cardiovascular system:RRR. No murmurs, rubs, gallops. Gastrointestinal system: Abdomen is obese, nondistended, soft and nontender. No organomegaly or masses felt. Normal bowel sounds heard. Central nervous system: No focal neurological deficits. Extremities: No cyanosis or clubbing. Skin: Extensive erythematous changes with psoriatic plaques throughout patient's body.  Torso, pannus and under breast demonstrating moist redness and whitish crusted lesion with concern  for superimposed fungal infection. Psychiatry: Judgement and insight appear normal. Mood & affect appropriate.   Data Reviewed: TSH: 1.849 Phosphorus: 4.1 Magnesium : 1.5 Procalcitonin: < 0.10 Urinalysis with amber colon, specific gravity 1.024, negative nitrite, negative leukocyte esterase and no hemoglobin urine dipstick. Lactic acid 3.0 Basic metabolic panel: Sodium 140, potassium 4.7, chloride 102, bicarb 25, BUN 45, creatinine 2.38 and GFR 20 CBC: WBC 22.9, hemoglobin 10.0 and platelet count 341K   Assessment and Plan: 1-SIRS/Psoriasis flare and concerns for superimposed fungal dermatitis - No fever currently appreciated and negative procalcitonin level - Will hold on further antibiotic - Following recommendation by dermatology will start clobetasol  topical, IV Diflucan  and topical nystatin . - Fluid resuscitation and supportive care will be provided - Follow clinical response. - Patient may end up needing IV steroids.  2-acute kidney injury on chronic kidney disease stage IIIa at baseline - In the setting of prerenal azotemia, hypertension and continue use of nephrotoxic agents - Urinalysis no suggesting infection - Provide fluid resuscitation - Minimize/avoid nephrotoxic agents - Avoid the use of contrast - Keep patient's systolic blood pressure above 899 as much as  possible. - Follow renal function trend.  3-paroxysmal atrial fibrillation - Continue telemetry monitoring - Follow electrolytes and replete as needed; goal is for magnesium  above 2 and potassium above 4 - Continue treatment with metoprolol  holding Cardizem  (in the setting of hypotension) - Continue Eliquis  for secondary prevention.  4-class I obesity -Body mass index is 33.26 kg/m.  - Low-calorie diet, portion control and increased activity discussed with patient.  5-depression/anxiety/insomnia - No suicidal ideation or hallucination - Continue treatment with Lexapro  and as needed  trazodone   6-COPD/asthma/chronic respiratory failure with hypoxia - Continue treatment with bronchodilator management - No acute exacerbation currently appreciated - Continue home oxygen  supplementation and follow clinical response.    Advance Care Planning:   Code Status: Full Code   Consults: Dermatology service from [facility] consulted; recommended plan is for patient to be transferred once bed available over there.  Family Communication: No family at bedside.  Severity of Illness: The appropriate patient status for this patient is INPATIENT. Inpatient status is judged to be reasonable and necessary in order to provide the required intensity of service to ensure the patient's safety. The patient's presenting symptoms, physical exam findings, and initial radiographic and laboratory data in the context of their chronic comorbidities is felt to place them at high risk for further clinical deterioration. Furthermore, it is not anticipated that the patient will be medically stable for discharge from the hospital within 2 midnights of admission.   * I certify that at the point of admission it is my clinical judgment that the patient will require inpatient hospital care spanning beyond 2 midnights from the point of admission due to high intensity of service, high risk for further deterioration and high frequency of surveillance required.*  Author: Eric Nunnery, MD 01/24/2024 5:59 PM  For on call review [email or web address].

## 2024-01-25 DIAGNOSIS — L409 Psoriasis, unspecified: Secondary | ICD-10-CM | POA: Diagnosis not present

## 2024-01-25 DIAGNOSIS — R6510 Systemic inflammatory response syndrome (SIRS) of non-infectious origin without acute organ dysfunction: Secondary | ICD-10-CM | POA: Diagnosis not present

## 2024-01-25 LAB — CBC
HCT: 29.8 % — ABNORMAL LOW (ref 36.0–46.0)
Hemoglobin: 9.2 g/dL — ABNORMAL LOW (ref 12.0–15.0)
MCH: 32.9 pg (ref 26.0–34.0)
MCHC: 30.9 g/dL (ref 30.0–36.0)
MCV: 106.4 fL — ABNORMAL HIGH (ref 80.0–100.0)
Platelets: 307 K/uL (ref 150–400)
RBC: 2.8 MIL/uL — ABNORMAL LOW (ref 3.87–5.11)
RDW: 14.2 % (ref 11.5–15.5)
WBC: 22.9 K/uL — ABNORMAL HIGH (ref 4.0–10.5)
nRBC: 0 % (ref 0.0–0.2)

## 2024-01-25 LAB — BASIC METABOLIC PANEL WITH GFR
Anion gap: 11 (ref 5–15)
BUN: 36 mg/dL — ABNORMAL HIGH (ref 8–23)
CO2: 23 mmol/L (ref 22–32)
Calcium: 8.2 mg/dL — ABNORMAL LOW (ref 8.9–10.3)
Chloride: 102 mmol/L (ref 98–111)
Creatinine, Ser: 1.7 mg/dL — ABNORMAL HIGH (ref 0.44–1.00)
GFR, Estimated: 31 mL/min — ABNORMAL LOW (ref >60)
Glucose, Bld: 93 mg/dL (ref 70–99)
Potassium: 4.8 mmol/L (ref 3.5–5.1)
Sodium: 136 mmol/L (ref 135–145)

## 2024-01-25 LAB — MAGNESIUM: Magnesium: 1.6 mg/dL — ABNORMAL LOW (ref 1.7–2.4)

## 2024-01-25 LAB — MRSA NEXT GEN BY PCR, NASAL: MRSA by PCR Next Gen: NOT DETECTED

## 2024-01-25 MED ORDER — MAGNESIUM SULFATE IN D5W 1-5 GM/100ML-% IV SOLN
1.0000 g | Freq: Once | INTRAVENOUS | Status: AC
Start: 2024-01-25 — End: 2024-01-25
  Administered 2024-01-25: 1 g via INTRAVENOUS
  Filled 2024-01-25: qty 100

## 2024-01-25 MED ORDER — HYDROXYZINE HCL 10 MG PO TABS
10.0000 mg | ORAL_TABLET | Freq: Four times a day (QID) | ORAL | Status: DC | PRN
  Administered 2024-01-25 – 2024-01-28 (×5): 10 mg via ORAL
  Filled 2024-01-25 (×6): qty 1

## 2024-01-25 NOTE — Plan of Care (Signed)
  Problem: Education: Goal: Knowledge of General Education information will improve Description: Including pain rating scale, medication(s)/side effects and non-pharmacologic comfort measures Outcome: Progressing   Problem: Nutrition: Goal: Adequate nutrition will be maintained Outcome: Progressing   Problem: Coping: Goal: Level of anxiety will decrease Outcome: Progressing   Problem: Elimination: Goal: Will not experience complications related to bowel motility Outcome: Progressing Goal: Will not experience complications related to urinary retention Outcome: Progressing   Problem: Pain Managment: Goal: General experience of comfort will improve and/or be controlled Outcome: Progressing

## 2024-01-25 NOTE — Progress Notes (Signed)
 Progress Note   Patient: Denise Underwood FMW:[id number] DOB: 10-19-1945 DOA: 01/24/2024     1 DOS: the patient was seen and examined on 01/25/2024   Brief hospital admission narrative: Denise Underwood is a 78 y.o. female with medical history significant of hypertension, paroxysmal atrial fibrillation on Eliquis , chronic diastolic heart failure, anxiety, chronic pain from arthritis, hyperlipidemia, prediabetes and psoriasis; who presented to the hospital secondary to worsening diffuse rash, itching, pain and weakness.  Symptoms has been present for the last 2 weeks and worsening.  Patient apparently has been using her home topical antifungal lotions and also alcohol  swabs to try to improve ongoing discomfort without success.   Express no fever, no chills, no chest pain, no productive coughing spells, no nausea, no vomiting, no abdominal discomfort, no dysuria/hematuria, no melena, no hematochezia, no sick contacts or any other complaints.   Workup in the ED demonstrating what appears to be diffuse psoriasis flare with concern for SIRS/early sepsis from possible superimposed cellulitic process.  Cultures taken and empiric antibiotics given.  Patient blood pressure was soft and responded to fluid resuscitation.  Acute kidney injury on chronic renal failure present at time of admission.  Initial plan for patient to be transferred to [facility] for dermatology service evaluation; unfortunately no current beds available.  [facility] contacted to assist with further management while awaiting transfer.   Dermatology recommendations at the moment is for topical steroids; while ruling out the presence of superimposed infection.  Assessment and plan 1-SIRS/psoriasis flare -Low procalcitonin level appreciated - No fever - Improvement in blood pressure appreciated with supportive care and fluid resuscitation. - Will continue holding antibiotics for now - Continue treatment with IV Diflucan , topical clobetasol  and  topical nystatin . - Continue supportive care and as needed antihistamine agents for itching.  2-acute kidney injury on chronic kidney disease stage IIIa - Improving/stabilized and essentially back to her baseline after fluid resuscitation - Continue minimizing nephrotoxic agents - Follow renal function intermittently - Maintain adequate oral hydration.  3-paroxysmal atrial fibrillation - Continue telemetry monitoring - Follow electrolytes with a goal for magnesium  above 2 and potassium above 4 - Continue Eliquis  for secondary prevention - Continue treatment with metoprolol .  4-class I obesity -Body mass index is 34.74 kg/m.  -Low-calorie diet and portion control discussed with patient.  5-depression with anxiety/insomnia - Continue treatment with Lexapro  and as needed trazodone .  6-COPD/asthma/chronic respiratory failure with hypoxia - Mild expiratory wheezing appreciated along with scattered rhonchi - No frank exacerbation currently present - Continue home oxygen  supplementation and as needed bronchodilator management - Follow clinical response.  7-hypotension - Good response to the use of midodrine  - No Levophed  or any other pressor has been required - Continue minimizing antihypertensive agents and follow vital sign - Maintain adequate hydration.  8-hypomagnesemia - Continue repletion and follow electrolytes trend.   Subjective:  Feeling better; no chest pain, no nausea, no vomiting.  Diffuse psoriasis plaque, rash and skin breakdown/crates in hands appreciated.  Physical Exam: Vitals:   01/25/24 1636 01/25/24 1704 01/25/24 1730 01/25/24 1800  BP: (!) 103/39 136/70 (!) 117/95 (!) 97/45  Pulse: 77 84 86 84  Resp: (!) 23 13 (!) 30 (!) 27  Temp:      TempSrc:      SpO2: 97% 99% 99% 97%  Weight:      Height:       General exam: Alert, awake, oriented x 3; in no acute distress. Respiratory system: Good saturation on chronic supplementation; no using  accessory  muscles.  Mild expiratory wheezing and positive scattered rhonchi present on exam. Cardiovascular system: Rate controlled, no rubs, no gallops, no JVD. Gastrointestinal system: Abdomen is obese, nondistended, soft and nontender. No organomegaly or masses felt. Normal bowel sounds heard. Central nervous system: Moving 4 limbs spontaneously.  No focal neurological deficits. Extremities: No cyanosis or clubbing; trace edema appreciated bilaterally. Skin: No petechiae; fungal dermatitis in her torso along with diffuse erythematous changes and psoriatic plaques in her body appreciated on exam. Psychiatry: Judgement and insight appear normal. Mood & affect appropriate.    Data Reviewed: Basic metabolic panel: Sodium 136, potassium 4.8, chloride 102, bicarb 23, BUN 36, creatinine 1.7 and GFR 31 CBC: WBC 22.9, hemoglobin 9.2 and platelet count 307K Magnesium : 1.6 CRP 14.9 Procalcitonin: <0.10 ESR: 33  Family Communication: No family at bedside.  Disposition: Status is: Inpatient Remains inpatient appropriate because: Continue IV therapy.  Anticipating discharge.  Once medically stable.  Time spent: 50 minutes  Author: Eric Nunnery, MD 01/25/2024 6:32 PM  For on call review [email or web address].

## 2024-01-26 DIAGNOSIS — R6510 Systemic inflammatory response syndrome (SIRS) of non-infectious origin without acute organ dysfunction: Secondary | ICD-10-CM | POA: Diagnosis not present

## 2024-01-26 DIAGNOSIS — L409 Psoriasis, unspecified: Secondary | ICD-10-CM | POA: Diagnosis not present

## 2024-01-26 MED ORDER — MIDODRINE HCL 5 MG PO TABS
5.0000 mg | ORAL_TABLET | Freq: Once | ORAL | Status: AC
  Administered 2024-01-26: 5 mg via ORAL
  Filled 2024-01-26: qty 1

## 2024-01-26 MED ORDER — DEXTROSE 5 % IV SOLN
50.0000 mg | Freq: Two times a day (BID) | INTRAVENOUS | Status: DC
Start: 2024-01-27 — End: 2024-01-27
  Filled 2024-01-26 (×3): qty 1

## 2024-01-26 MED ORDER — MIDODRINE HCL 5 MG PO TABS
5.0000 mg | ORAL_TABLET | Freq: Two times a day (BID) | ORAL | Status: DC
  Filled 2024-01-26: qty 1

## 2024-01-26 MED ORDER — MIDODRINE HCL 5 MG PO TABS
5.0000 mg | ORAL_TABLET | Freq: Three times a day (TID) | ORAL | Status: DC
Start: 2024-01-26 — End: 2024-01-28
  Administered 2024-01-26 – 2024-01-28 (×5): 5 mg via ORAL
  Filled 2024-01-26 (×5): qty 1

## 2024-01-26 MED ORDER — DILTIAZEM HCL ER COATED BEADS 180 MG PO CP24
180.0000 mg | ORAL_CAPSULE | Freq: Every day | ORAL | Status: DC
  Administered 2024-01-26 – 2024-01-27 (×2): 180 mg via ORAL
  Filled 2024-01-26 (×2): qty 1

## 2024-01-26 NOTE — Progress Notes (Signed)
 Progress Note   Patient: Denise Underwood FMW:[id number] DOB: 07/23/1945 DOA: 01/24/2024     2 DOS: the patient was seen and examined on 01/26/2024   Brief hospital admission narrative: Denise Underwood is a 78 y.o. female with medical history significant of hypertension, paroxysmal atrial fibrillation on Eliquis , chronic diastolic heart failure, anxiety, chronic pain from arthritis, hyperlipidemia, prediabetes and psoriasis; who presented to the hospital secondary to worsening diffuse rash, itching, pain and weakness.  Symptoms has been present for the last 2 weeks and worsening.  Patient apparently has been using her home topical antifungal lotions and also alcohol  swabs to try to improve ongoing discomfort without success.   Express no fever, no chills, no chest pain, no productive coughing spells, no nausea, no vomiting, no abdominal discomfort, no dysuria/hematuria, no melena, no hematochezia, no sick contacts or any other complaints.   Workup in the ED demonstrating what appears to be diffuse psoriasis flare with concern for SIRS/early sepsis from possible superimposed cellulitic process.  Cultures taken and empiric antibiotics given.  Patient blood pressure was soft and responded to fluid resuscitation.  Acute kidney injury on chronic renal failure present at time of admission.  Initial plan for patient to be transferred to [facility] for dermatology service evaluation; unfortunately no current beds available.  [facility] contacted to assist with further management while awaiting transfer.   Dermatology recommendations at the moment is for topical steroids; while ruling out the presence of superimposed infection.  Assessment and plan 1-SIRS/psoriasis flare -Low procalcitonin level appreciated - No fever - Improvement in blood pressure appreciated with supportive care and fluid resuscitation. - Will continue holding antibiotics for now - Continue treatment with IV Diflucan , topical clobetasol  and  topical nystatin . - Continue supportive care and as needed antihistamine agents for itching. - After discussing with dermatology starting treatment with cyclosporine .  2-acute kidney injury on chronic kidney disease stage IIIa - Improving/stabilized and essentially back to her baseline after fluid resuscitation - Continue minimizing nephrotoxic agents; follow renal function trent and maintain adequate hydration.  3-paroxysmal atrial fibrillation - Continue telemetry monitoring - Follow electrolytes with a goal for magnesium  above 2 and potassium above 4 - Continue Eliquis  for secondary prevention - Continue treatment with metoprolol  and resume Cardizem .  4-class I obesity -Body mass index is 34.4 kg/m.  -Low-calorie diet and portion control discussed with patient.  5-depression with anxiety/insomnia - Continue treatment with Lexapro  and as needed trazodone .  6-COPD/asthma/chronic respiratory failure with hypoxia - Mild expiratory wheezing appreciated along with scattered rhonchi - No frank exacerbation currently present - Continue home oxygen  supplementation and as needed bronchodilator management - Follow clinical response.  7-hypotension - Good response to the use of midodrine  - No Levophed  or any other pressor has been required - Continue minimizing antihypertensive agents and follow vital sign - Maintain adequate hydration.  8-hypomagnesemia - Continue repletion and follow electrolytes trend.   Subjective:  Overall feeling better; no chest pain, no nausea, no vomiting.  Earlier experiencing transient episode of atrial fibrillation.  Physical Exam: Vitals:   01/26/24 1300 01/26/24 1509 01/26/24 1600 01/26/24 1619  BP: 102/71 (!) 99/21 (!) 80/59   Pulse: 68 68 70   Resp: (!) 25 (!) 22 19   Temp:    98 F (36.7 C)  TempSrc:    Oral  SpO2: 91% 96% 99%   Weight:      Height:       General exam: Alert, awake, oriented x 3; chronically ill in  appearance. Respiratory system: Clear to auscultation. Respiratory effort normal. Cardiovascular system:RRR. No murmurs, rubs, gallops. Gastrointestinal system: Abdomen is obese, nondistended, soft and nontender. No organomegaly or masses felt. Normal bowel sounds heard. Central nervous system:No focal neurological deficits. Extremities: No cyanosis or clubbing.  Trace edema appreciated bilaterally. Skin: No petechiae.  Significant plaque psoriasis and erythematous changes appreciated throughout the body.  Patient with positive fungal dermatitis in her torso. Psychiatry: Judgement and insight appear normal. Mood & affect appropriate.   Latest data Reviewed: Basic metabolic panel: Sodium 136, potassium 4.8, chloride 102, bicarb 23, BUN 36, creatinine 1.7 and GFR 31 CBC: WBC 22.9, hemoglobin 9.2 and platelet count 307K Magnesium : 1.6 CRP 14.9 Procalcitonin: <0.10 ESR: 33  Family Communication: Patient's daughter and niece at bedside.  Disposition: Status is: Inpatient Remains inpatient appropriate because: Continue IV therapy.  Anticipating discharge.  Once medically stable.  Time spent: 50 minutes  Author: Eric Nunnery, MD 01/26/2024 4:33 PM  For on call review [email or web address].

## 2024-01-26 NOTE — Plan of Care (Signed)
  Problem: Education: Goal: Knowledge of General Education information will improve Description: Including pain rating scale, medication(s)/side effects and non-pharmacologic comfort measures Outcome: Progressing   Problem: Nutrition: Goal: Adequate nutrition will be maintained Outcome: Progressing   Problem: Elimination: Goal: Will not experience complications related to bowel motility Outcome: Progressing Goal: Will not experience complications related to urinary retention Outcome: Progressing   Problem: Pain Managment: Goal: General experience of comfort will improve and/or be controlled Outcome: Progressing

## 2024-01-26 NOTE — TOC Initial Note (Signed)
 Transition of Care [facility]) - Initial/Assessment Note    Patient Details  Name: Denise Underwood MRN: [id number] Date of Birth: 27-Feb-1946  Transition of Care [facility]) CM/SW Contact:    Lucie Lunger, LCSWA Phone Number: 01/26/2024, 11:20 AM  Clinical Narrative:                 Pt is high risk for readmission. CSW spoke with pt and daughters at bedside to complete assessment. Pt lives alone but has an aide that comes out 5 days a week. Pt has had HH in the past but not currently. Pt has a walker and canes in the home to use if needed. Pt is on home O2 supplied through [employer]. TOC to follow.   Expected Discharge Plan: Home/Self Care Barriers to Discharge: Continued Medical Work up   Patient Goals and CMS Choice Patient states their goals for this hospitalization and ongoing recovery are:: return home CMS Medicare.gov Compare Post Acute Care list provided to:: Patient Choice offered to / list presented to : Patient      Expected Discharge Plan and Services In-house Referral: Clinical Social Work Discharge Planning Services: CM Consult   Living arrangements for the past 2 months: Single Family Home                                      Prior Living Arrangements/Services Living arrangements for the past 2 months: Single Family Home Lives with:: Self Patient language and need for interpreter reviewed:: Yes Do you feel safe going back to the place where you live?: Yes      Need for Family Participation in Patient Care: Yes (Comment) Care giver support system in place?: Yes (comment) Current home services: DME Criminal Activity/Legal Involvement Pertinent to Current Situation/Hospitalization: No - Comment as needed  Activities of Daily Living   ADL Screening (condition at time of admission) Independently performs ADLs?: No Does the patient have a NEW difficulty with bathing/dressing/toileting/self-feeding that is expected to last >3 days?: Yes (Initiates electronic notice to  provider for possible OT consult) Does the patient have a NEW difficulty with getting in/out of bed, walking, or climbing stairs that is expected to last >3 days?: Yes (Initiates electronic notice to provider for possible PT consult) Does the patient have a NEW difficulty with communication that is expected to last >3 days?: No Is the patient deaf or have difficulty hearing?: No Does the patient have difficulty seeing, even when wearing glasses/contacts?: No Does the patient have difficulty concentrating, remembering, or making decisions?: No  Permission Sought/Granted                  Emotional Assessment Appearance:: Appears stated age Attitude/Demeanor/Rapport: Engaged Affect (typically observed): Accepting Orientation: : Oriented to Self, Oriented to Place, Oriented to  Time, Oriented to Situation Alcohol  / Substance Use: Not Applicable Psych Involvement: No (comment)  Admission diagnosis:  SIRS (systemic inflammatory response syndrome) (HCC) [R65.10] Patient Active Problem List   Diagnosis Date Noted   SIRS (systemic inflammatory response syndrome) (HCC) 01/24/2024   Cellulitis of right lower extremity 10/22/2023   CKD stage 3b, GFR 30-44 ml/min (HCC) 10/22/2023   COPD GOLD 3 02/06/2023   Atrial fibrillation (HCC) 02/05/2023   Chronic diastolic CHF (congestive heart failure) (HCC) 02/05/2023   Acute kidney injury superimposed on chronic kidney disease (HCC) 02/04/2023   Severe sepsis (HCC) 02/03/2023   Seasonal allergic rhinitis due to  pollen 11/19/2022   Vitamin D  deficiency 05/30/2022   Moderate episode of recurrent major depressive disorder (HCC) 10/02/2021   Difficulty sleeping 10/02/2021   Osteopenia 07/04/2021   Bilateral chronic knee pain 06/22/2020   Tobacco use 06/22/2020   At high risk for falls 06/15/2018   Primary osteoarthritis involving multiple joints 06/15/2018   Primary hypertension 04/28/2017   Psoriasis 04/28/2017   Hyperlipidemia 04/28/2017   GAD  (generalized anxiety disorder) 04/28/2017   PCP:  [name], FNP Pharmacy:   [facility] - [place], [place] - [address] [place] [address] Phone: [phone] Fax: [phone]  [facility] [place], [place] - [address] [place] [address] Phone: [phone] Fax: [phone]     Social Drivers of Health (SDOH) Social History: SDOH Screenings   Food Insecurity: No Food Insecurity (01/24/2024)  Housing: Low Risk  (01/24/2024)  Transportation Needs: No Transportation Needs (01/24/2024)  Utilities: Not At Risk (01/24/2024)  Alcohol  Screen: Low Risk  (11/07/2022)  Depression (PHQ2-9): High Risk (10/29/2023)  Financial Resource Strain: Low Risk  (11/07/2022)  Physical Activity: Insufficiently Active (11/07/2022)  Social Connections: Moderately Isolated (01/24/2024)  Stress: No Stress Concern Present (11/07/2022)  Tobacco Use: Medium Risk (01/24/2024)   SDOH Interventions:     Readmission Risk Interventions    01/26/2024   11:18 AM 02/04/2023    5:21 PM  Readmission Risk Prevention Plan  Transportation Screening Complete Complete  PCP or Specialist Appt within 5-7 Days  Complete  Home Care Screening  Complete  Medication Review (RN CM)  Complete  HRI or Home Care Consult Complete   Social Work Consult for Recovery Care Planning/Counseling Complete   Palliative Care Screening Not Applicable   Medication Review [employer]) Complete

## 2024-01-27 DIAGNOSIS — R6510 Systemic inflammatory response syndrome (SIRS) of non-infectious origin without acute organ dysfunction: Secondary | ICD-10-CM | POA: Diagnosis not present

## 2024-01-27 DIAGNOSIS — L409 Psoriasis, unspecified: Secondary | ICD-10-CM | POA: Diagnosis not present

## 2024-01-27 LAB — CBC
HCT: 28 % — ABNORMAL LOW (ref 36.0–46.0)
Hemoglobin: 8.6 g/dL — ABNORMAL LOW (ref 12.0–15.0)
MCH: 31.7 pg (ref 26.0–34.0)
MCHC: 30.7 g/dL (ref 30.0–36.0)
MCV: 103.3 fL — ABNORMAL HIGH (ref 80.0–100.0)
Platelets: 315 K/uL (ref 150–400)
RBC: 2.71 MIL/uL — ABNORMAL LOW (ref 3.87–5.11)
RDW: 14.3 % (ref 11.5–15.5)
WBC: 20.3 K/uL — ABNORMAL HIGH (ref 4.0–10.5)
nRBC: 0.1 % (ref 0.0–0.2)

## 2024-01-27 LAB — BASIC METABOLIC PANEL WITH GFR
Anion gap: 7 (ref 5–15)
BUN: 29 mg/dL — ABNORMAL HIGH (ref 8–23)
CO2: 24 mmol/L (ref 22–32)
Calcium: 8.4 mg/dL — ABNORMAL LOW (ref 8.9–10.3)
Chloride: 107 mmol/L (ref 98–111)
Creatinine, Ser: 1.25 mg/dL — ABNORMAL HIGH (ref 0.44–1.00)
GFR, Estimated: 44 mL/min — ABNORMAL LOW (ref >60)
Glucose, Bld: 107 mg/dL — ABNORMAL HIGH (ref 70–99)
Potassium: 5.1 mmol/L (ref 3.5–5.1)
Sodium: 138 mmol/L (ref 135–145)

## 2024-01-27 MED ORDER — DEXTROSE 5 % IV SOLN
50.0000 mg | Freq: Two times a day (BID) | INTRAVENOUS | Status: DC
  Administered 2024-01-27 (×2): 50 mg via INTRAVENOUS
  Filled 2024-01-27 (×5): qty 1

## 2024-01-27 MED ORDER — METOPROLOL TARTRATE 25 MG PO TABS
12.5000 mg | ORAL_TABLET | Freq: Two times a day (BID) | ORAL | Status: DC
  Filled 2024-01-27: qty 1

## 2024-01-27 NOTE — Progress Notes (Signed)
 Progress Note   Patient: Denise Underwood FMW:[id number] DOB: 1945-11-26 DOA: 01/24/2024     3 DOS: the patient was seen and examined on 01/27/2024   Brief hospital admission narrative: Denise Underwood is a 78 y.o. female with medical history significant of hypertension, paroxysmal atrial fibrillation on Eliquis , chronic diastolic heart failure, anxiety, chronic pain from arthritis, hyperlipidemia, prediabetes and psoriasis; who presented to the hospital secondary to worsening diffuse rash, itching, pain and weakness.  Symptoms has been present for the last 2 weeks and worsening.  Patient apparently has been using her home topical antifungal lotions and also alcohol  swabs to try to improve ongoing discomfort without success.   Express no fever, no chills, no chest pain, no productive coughing spells, no nausea, no vomiting, no abdominal discomfort, no dysuria/hematuria, no melena, no hematochezia, no sick contacts or any other complaints.   Workup in the ED demonstrating what appears to be diffuse psoriasis flare with concern for SIRS/early sepsis from possible superimposed cellulitic process.  Cultures taken and empiric antibiotics given.  Patient blood pressure was soft and responded to fluid resuscitation.  Acute kidney injury on chronic renal failure present at time of admission.  Initial plan for patient to be transferred to [facility] for dermatology service evaluation; unfortunately no current beds available.  TRH contacted to assist with further management while awaiting transfer.   Dermatology recommendations at the moment is for topical steroids; while ruling out the presence of superimposed infection.  Assessment and plan 1-SIRS/psoriasis flare -Low procalcitonin level appreciated - No fever - Improvement in blood pressure appreciated with supportive care and fluid resuscitation. - Will continue holding antibiotics for now - Continue treatment with IV Diflucan , topical clobetasol  and  topical nystatin . - Continue supportive care and as needed antihistamine agents for itching. - After discussing with dermatology starting treatment with cyclosporine ; will follow clinical response..  2-acute kidney injury on chronic kidney disease stage IIIa - Improving/stabilized and essentially back to her baseline after fluid resuscitation - Continue minimizing nephrotoxic agents and avoid hypotension and the use of contrast; follow renal function trent and maintain adequate hydration. - Creatinine down to 1.2  3-paroxysmal atrial fibrillation - Continue telemetry monitoring - Follow electrolytes with a goal for magnesium  above 2 and potassium above 4 - Continue Eliquis  for secondary prevention - Continue treatment with metoprolol  and resume Cardizem .  4-class I obesity -Body mass index is 34.06 kg/m.  -Low-calorie diet and portion control discussed with patient.  5-depression with anxiety/insomnia - Continue treatment with Lexapro  and as needed trazodone .  6-COPD/asthma/chronic respiratory failure with hypoxia - Mild expiratory wheezing appreciated along with scattered rhonchi - No frank exacerbation currently present - Continue home oxygen  supplementation and as needed bronchodilator management - Follow clinical response.  7-hypotension - Good response to the use of midodrine  - No Levophed  or any other pressor has been required - Continue minimizing antihypertensive agents and follow vital sign - Maintain adequate hydration.  8-hypomagnesemia - Continue repletion and follow electrolytes trend.   Subjective:  Demonstrating adequate blood pressure levels without pressure supports on today's exam; no chest pain, no nausea, no vomiting, no fever.  Expressed feeling slightly better.  Physical Exam: Vitals:   01/27/24 0748 01/27/24 0844 01/27/24 1132 01/27/24 1622  BP:  (!) 140/44    Pulse:  91    Resp:      Temp: 98.2 F (36.8 C)  97.9 F (36.6 C) 98.3 F (36.8 C)   TempSrc: Oral  Oral Oral  SpO2:  Weight:      Height:       General exam: Alert, awake, oriented x 3; no chest pain, no nausea vomiting.  Reports feeling better. Respiratory system: Good saturation on chronic supplementation; no using accessory muscles. Cardiovascular system: Rate control, no rubs, no gallops, no JVD on exam. Gastrointestinal system: Abdomen is obese, nondistended, soft and nontender. No organomegaly or masses felt. Normal bowel sounds heard. Central nervous system:  No focal neurological deficits. Extremities: No cyanosis or clubbing; 1+ edema appreciated bilaterally ((lower extremity). Skin: No petechiae.  Diffuse erythematous changes (less angry), torso with fungal dermatitis changes and extensive psoriatic plaques in her body.  Overall slightly improved. Psychiatry: Judgement and insight appear normal. Mood & affect appropriate.   Latest data Reviewed: Basic metabolic panel: Sodium 138, potassium 5.1, chloride 107, bicarb 24, BUN 29, creatinine 1.25 and GFR 44 CBC: WBCs 20, hemoglobin 9.6 and platelet count 315K CRP 14.9 Procalcitonin: <0.10 ESR: 33  Family Communication: Patient's daughter and niece at bedside.  Disposition: Status is: Inpatient Remains inpatient appropriate because: Continue IV therapy.  Anticipating discharge.  Once medically stable.  Time spent: 50 minutes  Author: Eric Nunnery, MD 01/27/2024 7:00 PM  For on call review [email or web address].

## 2024-01-27 NOTE — Plan of Care (Signed)

## 2024-01-28 DIAGNOSIS — N1831 Chronic kidney disease, stage 3a: Secondary | ICD-10-CM | POA: Diagnosis not present

## 2024-01-28 DIAGNOSIS — R7303 Prediabetes: Secondary | ICD-10-CM | POA: Diagnosis not present

## 2024-01-28 DIAGNOSIS — Z79899 Other long term (current) drug therapy: Secondary | ICD-10-CM | POA: Diagnosis not present

## 2024-01-28 DIAGNOSIS — E872 Acidosis, unspecified: Secondary | ICD-10-CM | POA: Diagnosis not present

## 2024-01-28 DIAGNOSIS — R6510 Systemic inflammatory response syndrome (SIRS) of non-infectious origin without acute organ dysfunction: Secondary | ICD-10-CM | POA: Diagnosis not present

## 2024-01-28 DIAGNOSIS — Z7901 Long term (current) use of anticoagulants: Secondary | ICD-10-CM | POA: Diagnosis not present

## 2024-01-28 DIAGNOSIS — N179 Acute kidney failure, unspecified: Secondary | ICD-10-CM | POA: Diagnosis not present

## 2024-01-28 DIAGNOSIS — E875 Hyperkalemia: Secondary | ICD-10-CM | POA: Diagnosis not present

## 2024-01-28 DIAGNOSIS — E785 Hyperlipidemia, unspecified: Secondary | ICD-10-CM | POA: Diagnosis not present

## 2024-01-28 DIAGNOSIS — I5032 Chronic diastolic (congestive) heart failure: Secondary | ICD-10-CM | POA: Diagnosis not present

## 2024-01-28 DIAGNOSIS — I130 Hypertensive heart and chronic kidney disease with heart failure and stage 1 through stage 4 chronic kidney disease, or unspecified chronic kidney disease: Secondary | ICD-10-CM | POA: Diagnosis not present

## 2024-01-28 DIAGNOSIS — F32A Depression, unspecified: Secondary | ICD-10-CM | POA: Diagnosis not present

## 2024-01-28 DIAGNOSIS — J449 Chronic obstructive pulmonary disease, unspecified: Secondary | ICD-10-CM | POA: Diagnosis not present

## 2024-01-28 DIAGNOSIS — I480 Paroxysmal atrial fibrillation: Secondary | ICD-10-CM | POA: Diagnosis not present

## 2024-01-28 DIAGNOSIS — R000 Tachycardia, unspecified: Secondary | ICD-10-CM | POA: Diagnosis not present

## 2024-01-28 DIAGNOSIS — F419 Anxiety disorder, unspecified: Secondary | ICD-10-CM | POA: Diagnosis not present

## 2024-01-28 DIAGNOSIS — L409 Psoriasis, unspecified: Secondary | ICD-10-CM | POA: Diagnosis not present

## 2024-01-28 DIAGNOSIS — I4891 Unspecified atrial fibrillation: Secondary | ICD-10-CM | POA: Diagnosis not present

## 2024-01-28 DIAGNOSIS — L408 Other psoriasis: Secondary | ICD-10-CM | POA: Diagnosis not present

## 2024-01-28 DIAGNOSIS — G8929 Other chronic pain: Secondary | ICD-10-CM | POA: Diagnosis not present

## 2024-01-28 MED ORDER — TRAMADOL HCL 50 MG PO TABS: 50.0000 mg | ORAL_TABLET | Freq: Three times a day (TID) | ORAL | Status: DC | PRN

## 2024-01-28 MED ORDER — ACETAMINOPHEN 650 MG RE SUPP: 650.0000 mg | Freq: Four times a day (QID) | RECTAL | Status: AC | PRN

## 2024-01-28 MED ORDER — CLOBETASOL PROPIONATE 0.05 % EX CREA: TOPICAL_CREAM | Freq: Two times a day (BID) | CUTANEOUS | 0 refills | Status: AC

## 2024-01-28 MED ORDER — ACETAMINOPHEN 325 MG PO TABS: 650.0000 mg | ORAL_TABLET | Freq: Four times a day (QID) | ORAL | Status: AC | PRN

## 2024-01-28 MED ORDER — METOPROLOL TARTRATE 25 MG PO TABS: 12.5000 mg | ORAL_TABLET | Freq: Two times a day (BID) | ORAL | Status: DC

## 2024-01-28 MED ORDER — MIDODRINE HCL 5 MG PO TABS: 5.0000 mg | ORAL_TABLET | Freq: Three times a day (TID) | ORAL | Status: DC

## 2024-01-28 MED ORDER — DEXTROSE 5 % IV SOLN: 50.0000 mg | Freq: Two times a day (BID) | INTRAVENOUS | Status: DC

## 2024-01-28 MED ORDER — HYDROXYZINE HCL 10 MG PO TABS: 10.0000 mg | ORAL_TABLET | Freq: Four times a day (QID) | ORAL | Status: DC | PRN

## 2024-01-28 NOTE — Discharge Summary (Signed)
 Physician Discharge Summary   Patient: Denise Underwood MRN: [id number] DOB: 06-04-1945  Admit date:     01/24/2024  Discharge date: 01/28/24  Discharge Physician: Eric Nunnery   PCP: [name], FNP   Recommendations at discharge:  Continue further treatment for severe psoriasis as per Dermatology service discretion.  Discharge Diagnoses: Principal Problem:   SIRS (systemic inflammatory response syndrome) (HCC) Psoriasis (severe flare) Fungal dermatitis  AKI on CKD stage 3a PAF Class 1 obesity Depression with anxiety COPD/asthma and chronic resp failure with hypoxia Hypotension  Hypomagnesemia  Brief hospital admission narrative: RHODIE CIENFUEGOS is a 78 y.o. female with medical history significant of hypertension, paroxysmal atrial fibrillation on Eliquis , chronic diastolic heart failure, anxiety, chronic pain from arthritis, hyperlipidemia, prediabetes and psoriasis; who presented to the hospital secondary to worsening diffuse rash, itching, pain and weakness.  Symptoms has been present for the last 2 weeks and worsening.  Patient apparently has been using her home topical antifungal lotions and also alcohol  swabs to try to improve ongoing discomfort without success.   Express no fever, no chills, no chest pain, no productive coughing spells, no nausea, no vomiting, no abdominal discomfort, no dysuria/hematuria, no melena, no hematochezia, no sick contacts or any other complaints.   Workup in the ED demonstrating what appears to be diffuse psoriasis flare with concern for SIRS/early sepsis from possible superimposed cellulitic process.  Cultures taken and empiric antibiotics given.  Patient blood pressure was soft and responded to fluid resuscitation.  Acute kidney injury on chronic renal failure present at time of admission.  Initial plan for patient to be transferred to [facility] for dermatology service evaluation; unfortunately no current beds available.  TRH contacted to assist  with further management while awaiting transfer.   Dermatology recommendations at the moment is for topical steroids; while ruling out the presence of superimposed infection.  Assessment and Plan: 1-SIRS/psoriasis flare -Low procalcitonin level appreciated - No fever and no drainage. - Improvement in blood pressure appreciated with supportive care and fluid resuscitation. - Will continue holding antibiotics for now - Completed aggressive 3 days IV Diflucan , continue topical clobetasol  and topical nystatin . - Continue supportive care and as needed antihistamine agents for itching. - After discussing with local dermatology started treatment with cyclosporine ; will follow clinical response..   2-acute kidney injury on chronic kidney disease stage IIIa - Improving/stabilized and essentially back to her baseline after fluid resuscitation - Continue minimizing nephrotoxic agents and avoid hypotension and the use of contrast; follow renal function trent and maintain adequate hydration. - Creatinine down to 1.2; continue monitoring    3-paroxysmal atrial fibrillation - Continue telemetry monitoring - Follow electrolytes with a goal for magnesium  above 2 and potassium above 4 - Continue Eliquis  for secondary prevention - Continue treatment with metoprolol  and resume Cardizem .   4-class I obesity -Body mass index is 33.91 kg/m.  -Low-calorie diet and portion control discussed with patient.   5-depression with anxiety/insomnia - Continue treatment with Lexapro  and as needed trazodone .   6-COPD/asthma/chronic respiratory failure with hypoxia - Mild expiratory wheezing appreciated along with scattered rhonchi - No frank exacerbation currently present - Continue home oxygen  supplementation and as needed bronchodilator management - Follow clinical response.   7-hypotension - Good response to the use of midodrine  - No Levophed  or any other pressor has been required - Continue minimizing  antihypertensive agents and follow vital sign - Maintain adequate hydration.   8-hypomagnesemia - Continue repletion and follow electrolytes trend.  Consultants: dermatology  Procedures performed: see below for x-ray reports  Disposition: transfer to [facility]  Diet recommendation: heart healthy/low sodium diet.  DISCHARGE MEDICATION: Allergies as of 01/28/2024   No Known Allergies      Medication List     STOP taking these medications    acetaminophen  650 MG CR tablet Commonly known as: TYLENOL  Replaced by: acetaminophen  325 MG tablet   CeraVe Diabetics Dry Skin Crea   HYDROcodone -acetaminophen  5-325 MG tablet Commonly known as: NORCO/VICODIN   lisinopril  5 MG tablet Commonly known as: ZESTRIL    triamcinolone  cream 0.1 % Commonly known as: KENALOG        TAKE these medications    acetaminophen  325 MG tablet Commonly known as: TYLENOL  Take 2 tablets (650 mg total) by mouth every 6 (six) hours as needed for mild pain (pain score 1-3) or fever (or Fever >/= 101). Replaces: acetaminophen  650 MG CR tablet   acetaminophen  650 MG suppository Commonly known as: TYLENOL  Place 1 suppository (650 mg total) rectally every 6 (six) hours as needed for mild pain (pain score 1-3) or fever (or Fever >/= 101).   albuterol  108 (90 Base) MCG/ACT inhaler Commonly known as: VENTOLIN  HFA Inhale 2 puffs into the lungs every 6 (six) hours as needed for wheezing or shortness of breath.   apixaban  5 MG Tabs tablet Commonly known as: ELIQUIS  Take 1 tablet (5 mg total) by mouth 2 (two) times daily.   Cartia  XT 180 MG 24 hr capsule Generic drug: diltiazem  take 1 capsule (180 milligram total) by mouth daily. What changed: See the new instructions.   clobetasol  cream 0.05 % Commonly known as: TEMOVATE  Apply topically 2 (two) times daily.   cycloSPORINE  50 mg in dextrose  5 % 250 mL Inject 50 mg into the vein every 12 (twelve) hours.   escitalopram  20 MG tablet Commonly  known as: LEXAPRO  Take 1 tablet (20 mg total) by mouth daily.   fluticasone -salmeterol 250-50 MCG/ACT Aepb Commonly known as: ADVAIR inhale 1 puff into the lungs in the morning and at bedtime.   furosemide  20 MG tablet Commonly known as: LASIX  Take 1 tablet (20 mg total) by mouth daily as needed. For Fluid What changed: reasons to take this   hydrOXYzine  10 MG tablet Commonly known as: ATARAX  Take 1 tablet (10 mg total) by mouth every 6 (six) hours as needed for itching.   metoprolol  tartrate 25 MG tablet Commonly known as: LOPRESSOR  Take 0.5 tablets (12.5 mg total) by mouth 2 (two) times daily. What changed: how much to take   midodrine  5 MG tablet Commonly known as: PROAMATINE  Take 1 tablet (5 mg total) by mouth 3 (three) times daily with meals.   nystatin  powder Commonly known as: MYCOSTATIN /NYSTOP  Apply 1 Application topically 3 (three) times daily.   pantoprazole  40 MG tablet Commonly known as: PROTONIX  take 1 tablet (40 milligram total) by mouth daily. What changed: See the new instructions.   traMADol  50 MG tablet Commonly known as: ULTRAM  Take 1 tablet (50 mg total) by mouth every 8 (eight) hours as needed for severe pain (pain score 7-10).   traZODone  50 MG tablet Commonly known as: DESYREL  Take 1 tablet (50 mg total) by mouth at bedtime.        Discharge Exam: Filed Weights   01/26/24 0608 01/27/24 0500 01/28/24 0500  Weight: 90.9 kg 90 kg 89.6 kg   General exam: Alert, awake, oriented x 3; no chest pain, no nausea vomiting.  Reports feeling better. Respiratory system: Good saturation on chronic  supplementation; no using accessory muscles. Cardiovascular system: Rate control, no rubs, no gallops, no JVD on exam. Gastrointestinal system: Abdomen is obese, nondistended, soft and nontender. No organomegaly or masses felt. Normal bowel sounds heard. Central nervous system:  No focal neurological deficits. Extremities: No cyanosis or clubbing; 1+ edema  appreciated bilaterally ((lower extremity). Skin: No petechiae.  Diffuse erythematous changes (less angry), torso with fungal dermatitis changes and extensive psoriatic plaques in her body.  Overall slightly improved. Psychiatry: Judgement and insight appear normal. Mood & affect appropriate.   Condition at discharge: stable  The results of significant diagnostics from this hospitalization (including imaging, microbiology, ancillary and laboratory) are listed below for reference.   Imaging Studies: DG Chest Portable 1 View Result Date: 01/24/2024 EXAM: 1 VIEW XRAY OF THE CHEST 01/24/2024 11:50:00 AM COMPARISON: 10/22/2023 CLINICAL HISTORY: Hypotension, leukocytosis. Per chart: Bilaterals hands cracked open and sore and psoriasis rash for the past couple of weeks. Questionable sepsis. FINDINGS: LUNGS AND PLEURA: No focal pulmonary opacity. No pulmonary edema. No pleural effusion. No pneumothorax. HEART AND MEDIASTINUM: No acute abnormality of the cardiac and mediastinal silhouettes. Aortic atherosclerosis. BONES AND SOFT TISSUES: No acute osseous abnormality. IMPRESSION: 1. No acute findings. 2. Aortic atherosclerosis. Electronically signed by: Waddell Calk MD 01/24/2024 12:21 PM EDT RP Workstation: [id number]    Microbiology: Results for orders placed or performed during the hospital encounter of 01/24/24  Blood culture (routine x 2)     Status: None (Preliminary result)   Collection Time: 01/24/24 10:20 AM   Specimen: BLOOD  Result Value Ref Range Status   Specimen Description   Final    BLOOD LEFT ANTECUBITAL Performed at [facility], [address], [place], [place] [address]    Special Requests   Final    BOTTLES DRAWN AEROBIC AND ANAEROBIC Blood Culture adequate volume Performed at [facility], [address] [place], [place] [address]    Culture   Final    NO GROWTH 3 DAYS Performed at [facility], [address], [place], [place] [address]    Report Status PENDING   Incomplete  Blood culture (routine x 2)     Status: None (Preliminary result)   Collection Time: 01/24/24 10:32 AM   Specimen: BLOOD  Result Value Ref Range Status   Specimen Description   Final    BLOOD BLOOD LEFT HAND Performed at [facility], [address], [place], [place] [address]    Special Requests   Final    BOTTLES DRAWN AEROBIC AND ANAEROBIC Blood Culture adequate volume Performed at [facility], [address] [place] [address], [place], [place] [address]    Culture   Final    NO GROWTH 3 DAYS Performed at [facility], [address] [place], [place] [address]    Report Status PENDING  Incomplete  MRSA Next Gen by PCR, Nasal     Status: None   Collection Time: 01/24/24  3:43 PM   Specimen: Nasal Mucosa; Nasal Swab  Result Value Ref Range Status   MRSA by PCR Next Gen NOT DETECTED NOT DETECTED Final    Comment: (NOTE) The GeneXpert MRSA Assay (FDA approved for NASAL specimens only), is one component of a comprehensive MRSA colonization surveillance program. It is not intended to diagnose MRSA infection nor to guide or monitor treatment for MRSA infections. Test performance is not FDA approved in patients less than 25 years old. Performed at [facility], [address], [place], [place] [address]     Labs: CBC:  Recent Labs  Lab 01/24/24 1020 01/25/24 0535 01/27/24 0526  WBC 22.9* 22.9* 20.3*  NEUTROABS 17.3*  --   --   HGB 10.0* 9.2* 8.6*  HCT 31.6* 29.8* 28.0*  MCV 103.9* 106.4* 103.3*  PLT 341 307 315   Basic Metabolic Panel: Recent Labs  Lab 01/24/24 1020 01/24/24 1500 01/25/24 0535 01/27/24 0526  NA 140  --  136 138  K 4.7  --  4.8 5.1  CL 102  --  102 107  CO2 25  --  23 24  GLUCOSE 123*  --  93 107*  BUN 45*  --  36* 29*  CREATININE 2.38*  --  1.70* 1.25*  CALCIUM  8.5*  --  8.2* 8.4*  MG  --  1.5* 1.6*  --   PHOS  --  4.1  --   --    Discharge time spent:  50 minutes.  Signed: Eric Nunnery, MD [facility] 01/28/2024

## 2024-01-28 NOTE — Plan of Care (Signed)
  Problem: Education: Goal: Knowledge of General Education information will improve Description: Including pain rating scale, medication(s)/side effects and non-pharmacologic comfort measures Outcome: Progressing   Problem: Health Behavior/Discharge Planning: Goal: Ability to manage health-related needs will improve Outcome: Progressing   Problem: Clinical Measurements: Goal: Ability to maintain clinical measurements within normal limits will improve Outcome: Progressing Goal: Will remain free from infection Outcome: Progressing Goal: Diagnostic test results will improve Outcome: Progressing Goal: Respiratory complications will improve Outcome: Progressing   Problem: Activity: Goal: Risk for activity intolerance will decrease Outcome: Progressing   Problem: Nutrition: Goal: Adequate nutrition will be maintained Outcome: Progressing   Problem: Coping: Goal: Level of anxiety will decrease Outcome: Progressing   Problem: Elimination: Goal: Will not experience complications related to bowel motility Outcome: Progressing Goal: Will not experience complications related to urinary retention Outcome: Progressing   Problem: Pain Managment: Goal: General experience of comfort will improve and/or be controlled Outcome: Progressing   Problem: Safety: Goal: Ability to remain free from injury will improve Outcome: Progressing

## 2024-01-29 DIAGNOSIS — L409 Psoriasis, unspecified: Secondary | ICD-10-CM | POA: Diagnosis not present

## 2024-01-29 LAB — CULTURE, BLOOD (ROUTINE X 2)
Culture: NO GROWTH
Culture: NO GROWTH
Special Requests: ADEQUATE
Special Requests: ADEQUATE

## 2024-01-30 DIAGNOSIS — L409 Psoriasis, unspecified: Secondary | ICD-10-CM | POA: Diagnosis not present

## 2024-01-31 DIAGNOSIS — L409 Psoriasis, unspecified: Secondary | ICD-10-CM | POA: Diagnosis not present

## 2024-01-31 NOTE — Discharge Summary (Signed)
 ------------------------------------------------------------------------------- Attestation signed by Leita Almarie Ada, DO at 02/02/2024  1:27 PM I have personally seen and performed a physical examination on patient Denise Underwood on 02/02/2024. I discussed the documented exam with the resident and agree, and have added my changes below. I have participated in the medical decision making process and agree with the documentation of the resident as consistent with my own findings, exam and plan as amended or described in this note.   I have reviewed labs and imaging, documentation from overnight, performed a physical exam, communicated with dermatology, participated in discussions about care coordination with the multidisciplinary team, independently interpreted and communicated results with patient and family, including discussion of plans for initiating the discharge or post-hospital care, optimization of acute and chronic medical problems, and optimization of mobility to further prevent weakness and deconditioning.   Ms. Brandner presented to us  with acute psoriasis flare.  Dermatology was consulted and recommended single dose infliximab with outpatient follow-up.  Hospitalization was complicated by hyperkalemia to 6.0 with several doses of Lokelma administered.  Renin aldosterone level was checked and is pending at discharge.  I recommended to her that she establish care with rheumatology with concern for psoriatic arthritis.  She is stable for discharge home with family care. -------------------------------------------------------------------------------  Gerontology (ACE Unit) Discharge Summary Name: Numa Heatwole MRN: [id number] Age: 1 yrs DOB: 1946-04-19  Admit date: 01/28/2024 Discharge date and time: Mon 02/02/2024  Admitting Physician: Denise VEAR Rhein, MD Discharge Physician: Leita Ada, DO  Admission Diagnoses:  Rash [R21] Psoriasis, unspecified [L40.9] Erythrodermic psoriasis [L40.8]    Discharge Diagnoses:  #Psoriasis flare, > 85% BSA   Admission Condition: fair Discharged Condition: good  Hospital Course:  For full details, please see H&P, progress notes, consult notes and ancillary notes. Briefly, Denise Underwood is a 78 y.o. female with a history of hypertension, paroxysmal atrial fibrillation on Eliquis , HFpEF (EF 60-65% 01/2023), anxiety, chronic pain from arthritis, COPD, hyperlipidemia, prediabetes, CKD stage IIIa, and psoriasis .  The patient's hospital course will be summarized in a problem based approach below.  #Psoriasis flare, > 85% BSA  Patient presented on 9/17 from outside hospital with a severe psoriasis rash covering greater than 85% of her body surface area with concern for cellulitis.  Patient initially had a significant  leukocytosis with a mild elevation in lactic acid.  She had been treated with IV Diflucan  as well as clobetasol .  She received a one-time dose of vancomycin  and cefepime  as the staff at the outside hospital was concern for an underlying cellulitis with her psoriasis rash.  On arrival, the patient is a rash appears severe, she had fissuring of the skin of the hands in addition to a flaking rash over her arms legs, abdomen, chest, back.  She was hemodynamically stable.  Her review of systems was negative for infectious signs.  Dermatology was consulted confirming a severe psoriasis flare.  There was no clinical concern for skin infection and the patient's antibiotics were not continued.  After evaluation, dermatology recommended topical triamcinolone  with wet wraps twice daily.  Patient was also initiated on infliximab therapy with a 5 mg/kg dose on 9/19.  With infliximab and the wet wrapping, the patient's clinical condition improved significantly.  On 9/22 the patient was examined and deemed clinically appropriate for discharge.    On day of discharge, patient is clinically stable with no new examination findings or acute symptoms compared to  prior.  The patient was seen by the attending physician on the date of  discharge and deemed stable and acceptable for discharge.  The patient's chronic medical conditions were treated accordingly per the patient's home medication regimen.  The patient's medication reconciliation [with changes made to chronic medications], follow-up appointments, discharge orders, instructions and significant lab and diagnostic studies are as noted.    Discharge Follow-up Action Items: Follow up with PCP in 1-2 weeks. Please examine the patient's volume status. Obtain a CBC and CMP, with attention to her potassium as she had required multiple doses of Lokelma for hyperkalemia Ensure patient has appropriate follow-up with dermatology Follow up Plasma Renin and Aldosterone, Hepatitis panel and Quant gold Referral to dermatology, Rheumatology New medication instructions Apply a thin later of the triamcinolone  to the affected areas. Take clean towels, put them in hot water , wring them out so they are moist, warm, and not dripping, then place them over the triamcinolone  for about 10-30 minutes.  The warmth and moisture will make the triamcinolone  penetrate the skin better.  After removing the towels, apply another thin layer of the triamcinolone  to help seal the moisture in the skin.   Patient's Ordered Code Status: Full Code   Consults: IP CONSULT TO DERMATOLOGY  Significant Diagnostic Studies:  CBC: Results from last 7 days  Lab Units 02/02/24 0906 02/01/24 0818  WHITE BLOOD CELL COUNT 10*3/uL 18.80* 20.10*  HEMOGLOBIN g/dL 9.8* 9.7*  HEMATOCRIT % 30.1* 28.8*  PLATELET COUNT 10*3/uL 423 398   DIFF: Results from last 7 days  Lab Units 01/28/24 1326  NEUTROPHILS RELATIVE PERCENT % 77  LYMPHOCYTES RELATIVE PERCENT % 13  MONOCYTES RELATIVE PERCENT % 7  BASOPHILS RELATIVE PERCENT % 0  EOSINOPHILS RELATIVE PERCENT % 3  NRBC % % 0  NEUTROPHILS ABSOLUTE COUNT 10*3/uL 16.50*  LYMPHOCYTES ABSOLUTE COUNT  10*3/uL 2.70  MONOCYTES ABSOLUTE COUNT 10*3/uL 1.60*  BASOPHILS ABSOLUTE COUNT 10*3/uL 0.10  EOSINOPHILS ABSOLUTE COUNT 10*3/uL 0.70*   CMP: Results from last 7 days  Lab Units 02/02/24 0906 02/01/24 1503 02/01/24 0818  SODIUM mmol/L 138 138 139  POTASSIUM mmol/L 5.4* 5.2* 6.0*  CHLORIDE mmol/L 99 99 102  CO2 mmol/L 33* 33* 31  BUN mg/dL 28* 29* 27*  CREATININE mg/dL 9.01 8.91 9.03  CALCIUM  mg/dL 9.2 8.9 8.8  MAGNESIUM  mg/dL 1.8*  --  1.7*  ANION GAP mmol/L 6 6 6    EKG:  Encounter Date: 01/28/24  ECG 12 lead   Narrative   Ventricular Rate                   77        BPM                  QRS Duration                       90        ms                   Q-T Interval                       386       ms                   QTC Calculation Bazett             436       ms  Calculated R Axis                  29        degrees              Calculated T Axis                  55        degrees               Atrial fibrillation  Otherwise normal ECG No previous ECGs available Confirmed by Kingston Novel  26  on 02-01-2024 1 19 58 PM  ECG 12 lead   Narrative   Ventricular Rate                   94        BPM                  QRS Duration                       84        ms                   Q-T Interval                       362       ms                   QTC Calculation Bazett             452       ms                   Calculated R Axis                  27        degrees              Calculated T Axis                  42        degrees               Atrial fibrillation  When compared with ECG of 28-Jan-2024 15 02, No significant change was found  ECG 12 lead   Narrative   Ventricular Rate                   108       BPM                  Atrial Rate                        108       BPM                  P-R Interval                       134       ms                   QRS Duration                       84        ms  Q-T Interval                        330       ms                   QTC Calculation Bazett             442       ms                   Calculated P Axis                  57        degrees              Calculated R Axis                  4         degrees              Calculated T Axis                  55        degrees               Sinus tachycardia Possible Left atrial enlargement Normal ECG When compared with ECG of 29-Jan-2024 23 31, Sinus rhythm has replaced Atrial fibrillation Confirmed by Kingston Novel  26  on 02-02-2024 9 18 04 AM    Patient Instructions:    Discharge Medications     PAUSE taking these medications      Sig Disp Refill Start End  dilTIAZem  180 mg 24 hr capsule Wait to take this until your doctor or other care provider tells you to start again. Commonly known as: CARDIZEM  CD  Take 180 mg by mouth daily.   0     lisinopriL  5 mg tablet Wait to take this until your doctor or other care provider tells you to start again. Commonly known as: PRINIVIL   Take 5 mg by mouth daily.   0     metoprolol  tartrate 25 mg tablet Wait to take this until your doctor or other care provider tells you to start again. Commonly known as: LOPRESSOR   Take 25 mg by mouth 2 (two) times a day.   0         New Medications      Sig Disp Refill Start End  triamcinolone  acetonide 0.1 % cream Commonly known as: KENALOG   Apply a thin-layer to the affected area of skin 2 (two) times a day. (Apply topically 2 (two) times a day.)  80 g  2         Medications To Continue      Sig Disp Refill Start End  acetaminophen  325 mg tablet Commonly known as: TYLENOL   Take 650 mg by mouth every 8 (eight) hours as needed for mild pain (1-3) or fever 100.4 F or GREATER.   0     albuterol  HFA 90 mcg/actuation inhaler Commonly known as: PROVENTIL  HFA;VENTOLIN  HFA;PROAIR  HFA  Inhale 2 puffs every 6 (six) hours as needed for wheezing or shortness of breath.   0     apixaban  5 mg Tab Commonly known as: ELIQUIS    Take 5 mg by mouth 2 (two) times a day.   0     escitalopram  20 mg tablet Commonly known as: LEXAPRO   Take 20 mg by mouth daily.   0  fluticasone  propion-salmeteroL 250-50 mcg/dose diskus inhaler Commonly known as: ADVAIR DISKUS  Inhale 1 puff 2 (two) times a day.   0     furosemide  20 mg tablet Commonly known as: LASIX   Take 20 mg by mouth daily as needed (for fluid).   0     HYDROcodone -acetaminophen  5-325 mg per tablet Commonly known as: NORCO  Take 1 tablet by mouth every 6 (six) hours as needed for severe pain (7-10).   0     nystatin  100,000 unit/gram powder Commonly known as: MYCOSTATIN   Apply 1 Application topically 3 (three) times a day as needed.   0     pantoprazole  40 mg EC tablet Commonly known as: PROTONIX   Take 40 mg by mouth every morning before breakfast.   0     traZODone  50 mg tablet Commonly known as: DESYREL   Take 50 mg by mouth at bedtime.   0          Discharge Orders     Ambulatory referral to Dermatology     Details:    Reason for Referral: Rash   Comments: Psoriasis   Ambulatory referral to PCP     Ambulatory referral to Rheumatology     Comments: Patient has ongoing arthritis in the setting of psoriasis, hope to rule out rheumatologic cause of arthritis   Full Code     Lifting Limits:     Details:    Lifting Limits: No lifting limits        Follow-up  No future appointments.      Electronically signed by: Garnette Lynwood Angst, MD 02/02/2024 11:56 AM

## 2024-02-01 DIAGNOSIS — E875 Hyperkalemia: Secondary | ICD-10-CM | POA: Diagnosis not present

## 2024-02-01 DIAGNOSIS — L409 Psoriasis, unspecified: Secondary | ICD-10-CM | POA: Diagnosis not present

## 2024-02-01 DIAGNOSIS — I4891 Unspecified atrial fibrillation: Secondary | ICD-10-CM | POA: Diagnosis not present

## 2024-02-02 DIAGNOSIS — L409 Psoriasis, unspecified: Secondary | ICD-10-CM | POA: Diagnosis not present

## 2024-02-02 DIAGNOSIS — E875 Hyperkalemia: Secondary | ICD-10-CM | POA: Diagnosis not present

## 2024-02-02 DIAGNOSIS — R000 Tachycardia, unspecified: Secondary | ICD-10-CM | POA: Diagnosis not present

## 2024-02-02 DIAGNOSIS — I4891 Unspecified atrial fibrillation: Secondary | ICD-10-CM | POA: Diagnosis not present

## 2024-02-03 ENCOUNTER — Telehealth: Payer: Self-pay | Admitting: *Deleted

## 2024-02-03 NOTE — Transitions of Care (Post Inpatient/ED Visit) (Signed)
 02/03/2024  Name: Denise Underwood MRN: [id number] DOB: 06-17-1945  Today's TOC FU Call Status: Today's TOC FU Call Status:: Successful TOC FU Call Completed TOC FU Call Complete Date: 02/03/24 Patient's Name and Date of Birth confirmed.  Transition Care Management Follow-up Telephone Call Date of Discharge: 02/02/24 Discharge Facility: Other [facility]) Name of Other (Non[facility]) Discharge Facility: [facility] Type of Discharge: Inpatient Admission Primary Inpatient Discharge Diagnosis:: Erythrodemic psoriasis How have you been since you were released from the hospital?:  (pt reports eating, drinking well, has hired help in the home, ambulating with walker, pt states psoriasis much better) Any questions or concerns?: No  Items Reviewed: Did you receive and understand the discharge instructions provided?: Yes Medications obtained,verified, and reconciled?: Yes (Medications Reviewed) Any new allergies since your discharge?: No Dietary orders reviewed?: Yes Type of Diet Ordered:: heart healthy Do you have support at home?: Yes People in Home [RPT]: alone Name of Support/Comfort Primary Source: has hired aide 3 hours per day/ 5 days per week  Medications Reviewed Today: Medications Reviewed Today     Reviewed by Aura Mliss LABOR, RN (Registered Nurse) on 02/03/24 at 1424  Med List Status: <None>   Medication Order Taking? Sig Documenting Provider Last Dose Status Informant  acetaminophen  (TYLENOL ) 325 MG tablet [id number] Yes Take 2 tablets (650 mg total) by mouth every 6 (six) hours as needed for mild pain (pain score 1-3) or fever (or Fever >/= 101). Ricky Fines, MD  Active   acetaminophen  (TYLENOL ) 650 MG suppository [id number] Yes Place 1 suppository (650 mg total) rectally every 6 (six) hours as needed for mild pain (pain score 1-3) or fever (or Fever >/= 101). Ricky Fines, MD  Active   albuterol  (VENTOLIN  HFA) 108 [phone] Base) MCG/ACT inhaler  [id number] Yes Inhale 2 puffs into the lungs every 6 (six) hours as needed for wheezing or shortness of breath. Pearlean Manus, MD  Active Self, Pharmacy Records  apixaban  (ELIQUIS ) 5 MG TABS tablet [id number] Yes Take 1 tablet (5 mg total) by mouth 2 (two) times daily. Pearlean Manus, MD  Active Self, Pharmacy Records  clobetasol  cream (TEMOVATE ) 0.05 % [id number] Yes Apply topically 2 (two) times daily. Ricky Fines, MD  Active   cycloSPORINE  50 mg in dextrose  5 % 250 mL [id number] Yes Inject 50 mg into the vein every 12 (twelve) hours. Ricky Fines, MD  Active   diltiazem  (CARTIA  XT) 180 MG 24 hr capsule [id number]  take 1 capsule (180 milligram total) by mouth daily.  Patient not taking: Reported on 02/03/2024   [name], FNP  Active Self, Pharmacy Records  escitalopram  (LEXAPRO ) 20 MG tablet [id number] Yes Take 1 tablet (20 mg total) by mouth daily. [name], FNP  Active Self, Pharmacy Records  fluticasone -salmeterol (ADVAIR) 250-50 MCG/ACT AEPB [id number] Yes inhale 1 puff into the lungs in the morning and at bedtime. [name], FNP  Active Self, Pharmacy Records  furosemide  (LASIX ) 20 MG tablet [id number] Yes Take 1 tablet (20 mg total) by mouth daily as needed. For Fluid Pearlean Manus, MD  Active Self, Pharmacy Records  hydrOXYzine  (ATARAX ) 10 MG tablet [id number]  Take 1 tablet (10 mg total) by mouth every 6 (six) hours as needed for itching.  Patient not taking: Reported on 02/03/2024   Ricky Fines, MD  Active   metoprolol  tartrate (LOPRESSOR ) 25 MG tablet [id number]  Take 0.5 tablets (12.5 mg total) by mouth 2 (two) times daily.  Patient not taking: Reported  on 02/03/2024   Ricky Fines, MD  Active   midodrine  (PROAMATINE ) 5 MG tablet [id number]  Take 1 tablet (5 mg total) by mouth 3 (three) times daily with meals.  Patient not taking: Reported on 02/03/2024   Ricky Fines, MD  Active   nystatin  (MYCOSTATIN /NYSTOP ) powder [id number] Yes Apply 1 Application topically 3  (three) times daily. [name], FNP  Active Self, Pharmacy Records  pantoprazole  (PROTONIX ) 40 MG tablet [id number]  take 1 tablet (40 milligram total) by mouth daily.  Patient not taking: Reported on 02/03/2024   [name], FNP  Active Self, Pharmacy Records  traMADol  (ULTRAM ) 50 MG tablet [id number] Yes Take 1 tablet (50 mg total) by mouth every 8 (eight) hours as needed for severe pain (pain score 7-10). Ricky Fines, MD  Active   traZODone  (DESYREL ) 50 MG tablet [id number] Yes Take 1 tablet (50 mg total) by mouth at bedtime. [name], FNP  Active Self, Pharmacy Records  Med List Note (Ward, Angelica, CPhT 10/22/23 1502): [facility]quis  [facility]            Home Care and Equipment/Supplies: Were Home Health Services Ordered?: No Any new equipment or medical supplies ordered?: No  Functional Questionnaire: Do you need assistance with bathing/showering or dressing?: Yes (aide assists) Do you need assistance with meal preparation?: Yes (aide assists) Do you need assistance with eating?: No Do you have difficulty maintaining continence: No Do you need assistance with getting out of bed/getting out of a chair/moving?: Yes (walker) Do you have difficulty managing or taking your medications?: No  Follow up appointments reviewed: PCP Follow-up appointment confirmed?: No (pt declined for RN CM to schedule post hospital follow up, pt states she wants to call herself) MD Provider Line Number:[phone] Given: No Specialist Hospital Follow-up appointment confirmed?: Yes Date of Specialist follow-up appointment?: 03/09/24 Follow-Up Specialty Provider:: cardiologist,  pt states she is waiting on dermatologist to call, if she does not hear from anyone by end of week, she will call Do you need transportation to your follow-up appointment?: No Do you understand care options if your condition(s) worsen?: Yes-patient verbalized understanding  SDOH Interventions Today    Flowsheet Row  Most Recent Value  SDOH Interventions   Food Insecurity Interventions Intervention Not Indicated  Housing Interventions Intervention Not Indicated  Transportation Interventions Intervention Not Indicated  Utilities Interventions Intervention Not Indicated     Goals Addressed             This Visit's Progress    VBCI Transitions of Care (TOC) Care Plan       Problems:  Recent Hospitalization for treatment of Erythrodemic psoriasis No Hospital Follow Up Provider appointment pt states she prefers to call and schedule her post hospital follow up, declined for RN CM to schedule Pt reports she lives alone, has hired caregiver Monday- Friday, is on oxygen  at 3 liters prn if 02 saturation <90% Pt reports her potassium was elevated but now it's good  Goal:  Over the next 30 days, the patient will not experience hospital readmission  Interventions:  Transitions of Care: Doctor Visits  - discussed the importance of doctor visits Reviewed Signs and symptoms of infection  Evaluation of current treatment plan related to Erythrodemic psoriasis, Inability to perform IADL's independently self-management and patient's adherence to plan as established by provider. Discussed plans with patient for ongoing care management follow up and provided patient with direct contact information for care management team Evaluation of current treatment plan related  to Erythrodemic psoriasis and patient's adherence to plan as established by provider Advised patient to use triamcinolone  cream with moist, warm towels as prescribed per discharge instructions Reviewed medications with patient and discussed importance of taking as prescribed, reviewed medications paused- diltiazem , lisinopril , metoprolol  Reviewed scheduled/upcoming provider appointments including cardiologist 03/09/24, pt awaiting call from dermatologist to schedule an appointment Discussed plans with patient for ongoing care management follow up and  provided patient with direct contact information for care management team Advised patient to discuss any worsening symptoms of psoriasis with provider Screening for signs and symptoms of depression related to chronic disease state  Assessed social determinant of health barriers Reviewed with pt per EMR, pt had hyperkalemia during hospitalization, importance of getting labs checked per discharge instructions, importance of calling today to schedule post hospital follow up to be seen within 1-2 weeks, pt verbalizes understanding  Patient Self Care Activities:  Attend all scheduled provider appointments Call pharmacy for medication refills 3-7 days in advance of running out of medications Call provider office for new concerns or questions  Notify RN Care Manager of TOC call rescheduling needs Participate in Transition of Care Program/Attend TOC scheduled calls Take medications as prescribed   Call today and schedule your post hospital follow up, lab work with primary care provider Per your discharge instructions- Apply a thin later of the triamcinolone  to the affected areas. Take clean towels, put them in hot water , wring them out so they are moist, warm, and not dripping, then place them over the triamcinolone  for about 10-30 minutes. The warmth and moisture will make the triamcinolone  penetrate the skin better. After removing the towels, apply another thin layer of the triamcinolone  to help seal the moisture in the skin.   Plan:  Telephone follow up appointment with care management team member scheduled for:  02/11/24 @ 945 am The patient has been provided with contact information for the care management team and has been advised to call with any health related questions or concerns.         Mliss Creed [facility], BSN RN Care Manager/ Transition of Care [place]/ [facility] [phone]

## 2024-02-07 DIAGNOSIS — I4892 Unspecified atrial flutter: Secondary | ICD-10-CM | POA: Diagnosis not present

## 2024-02-09 ENCOUNTER — Other Ambulatory Visit (HOSPITAL_COMMUNITY): Payer: Self-pay

## 2024-02-09 ENCOUNTER — Telehealth: Payer: Self-pay

## 2024-02-09 ENCOUNTER — Telehealth: Payer: Self-pay | Admitting: Family Medicine

## 2024-02-09 NOTE — Telephone Encounter (Signed)
 Copied from CRM [id number]. Topic: Clinical - Medication Prior Auth >> Feb 09, 2024  2:15 PM Yolanda T wrote: Reason for CRM: Burnard from [facility] called stated patient was discharged on 9/17 from [facility]. Script was sent to specialty phamacy for Llumya 100mg  for patient at discharge and med needs a prior authorization. Please f/u with [employer] at [phone]

## 2024-02-09 NOTE — Telephone Encounter (Signed)
 PA request has been Received. New Encounter has been or will be created for follow up. For additional info see Pharmacy Prior Auth telephone encounter from 02/09/24.

## 2024-02-09 NOTE — Telephone Encounter (Signed)
 Pharmacy Patient Advocate Encounter   Received notification from Pt Calls Messages that prior authorization for Ilumya 100MG /ML syringes  is required/requested.   Insurance verification completed.   The patient is insured through [facility] .   Per test claim:  SKIRIZI, STELARA, IDACIO, COSENTYX, TREMFYA is preferred by the insurance.  If suggested medication is appropriate, Please send in a new RX and discontinue this one. If not, please advise as to why it's not appropriate so that we may request a Prior Authorization. Please note, some preferred medications may still require a PA.  If the suggested medications have not been trialed and there are no contraindications to their use, the PA will not be submitted, as it will not be approved.

## 2024-02-11 ENCOUNTER — Other Ambulatory Visit: Payer: Self-pay | Admitting: *Deleted

## 2024-02-11 NOTE — Patient Instructions (Signed)
 Visit Information  Thank you for taking time to visit with me today. Please don't hesitate to contact me if I can be of assistance to you before our next scheduled telephone appointment.  Our next appointment is by telephone on 02/18/24 @ 1045 am  Following is a copy of your care plan:   Goals Addressed             This Visit's Progress    VBCI Transitions of Care (TOC) Care Plan       Problems:  Recent Hospitalization for treatment of Erythrodemic psoriasis No Hospital Follow Up Provider appointment pt states she prefers to call and schedule her post hospital follow up, declined for RN CM to schedule Pt reports she lives alone, has hired caregiver Monday- Friday, is on oxygen  at 3 liters prn if 02 saturation <90% Pt reports her potassium was elevated but now it's good 02/11/24- pt reports she is doing well, states psoriasis  is much better, continues using triamcinolone  cream as prescribed, pt is not weighing daily  Goal:  Over the next 30 days, the patient will not experience hospital readmission  Interventions:  Transitions of Care: Doctor Visits  - discussed the importance of doctor visits Reviewed Signs and symptoms of infection Evaluation of current treatment plan related to Erythrodemic psoriasis, Inability to perform IADL's independently self-management and patient's adherence to plan as established by provider. Discussed plans with patient for ongoing care management follow up and provided patient with direct contact information for care management team Evaluation of current treatment plan related to Erythrodemic psoriasis and patient's adherence to plan as established by provider Advised patient to use triamcinolone  cream with moist, warm towels as prescribed per discharge instructions Reviewed medications with patient and discussed importance of taking as prescribed, reviewed medications paused- diltiazem , lisinopril , metoprolol  Reviewed scheduled/upcoming provider  appointments including cardiologist 03/09/24, pt reports her daughter knows when appointment with dermatologist will be Discussed plans with patient for ongoing care management follow up and provided patient with direct contact information for care management team Advised patient to discuss any worsening symptoms of psoriasis with provider  Patient Self Care Activities:  Attend all scheduled provider appointments Call pharmacy for medication refills 3-7 days in advance of running out of medications Call provider office for new concerns or questions  Notify RN Care Manager of TOC call rescheduling needs Participate in Transition of Care Program/Attend TOC scheduled calls Take medications as prescribed   Per your discharge instructions- Apply a thin later of the triamcinolone  to the affected areas. Take clean towels, put them in hot water , wring them out so they are moist, warm, and not dripping, then place them over the triamcinolone  for about 10-30 minutes. The warmth and moisture will make the triamcinolone  penetrate the skin better. After removing the towels, apply another thin layer of the triamcinolone  to help seal the moisture in the skin.   Plan:  Telephone follow up appointment with care management team member scheduled for:  02/17/24 @ 1045 am The patient has been provided with contact information for the care management team and has been advised to call with any health related questions or concerns.         Patient verbalizes understanding of instructions and care plan provided today and agrees to view in MyChart. Active MyChart status and patient understanding of how to access instructions and care plan via MyChart confirmed with patient.     Telephone follow up appointment with care management team member scheduled for: 02/18/24 @  1045 am  Please call the care guide team at [phone] if you need to cancel or reschedule your appointment.   Please call the Suicide and Crisis  Lifeline: 988 call the [place]  National Suicide Prevention Lifeline: (276)882-6847 or TTY: 212-553-6309 TTY (209)631-0688) to talk to a trained counselor call 1-800-273-TALK (toll free, 24 hour hotline) go to [facility] [address] [place] [phone]) call the [facility] Crisis Line: [phone] call 911 if you are experiencing a Mental Health or Behavioral Health Crisis or need someone to talk to.  Mliss Creed [facility], BSN RN Care Manager/ Transition of Care [place]/ [facility] [phone]

## 2024-02-11 NOTE — Patient Outreach (Signed)
 Transition of Care week 2  Visit Note  02/11/2024  Name: Denise Underwood MRN: [id number]          DOB: [birth year]  Situation: Patient enrolled in [facility] 30-day program. Visit completed with patient by telephone.   Background:    Past Medical History:  Diagnosis Date   Anxiety    Arthritis    Hyperlipidemia    Hypertension    Osteopenia 07/04/2021   Prediabetes 07/09/2021   Psoriasis     Assessment: Patient Reported Symptoms: Cognitive Cognitive Status: No symptoms reported, Alert and oriented to person, place, and time, Normal speech and language skills, Able to follow simple commands      Neurological Neurological Review of Symptoms: No symptoms reported    HEENT HEENT Symptoms Reported: No symptoms reported      Cardiovascular Cardiovascular Symptoms Reported: Swelling in legs or feet (chronic swelling LE bil) Does patient have uncontrolled Hypertension?: No Cardiovascular Management Strategies: Adequate rest, Medication therapy Cardiovascular Self-Management Outcome: 4 (good) Cardiovascular Comment: Reinforced HF action plan, pt is not weighing daily,  RN CM instructed pt to weigh daily and keep a log  Respiratory Respiratory Symptoms Reported: Shortness of breath Other Respiratory Symptoms: dyspnea with exertion Additional Respiratory Details: pt is on oxygen  at 3 liters prn, pt reports if 02 saturation drops below 90% she uses oxygen  Respiratory Management Strategies: Adequate rest, Oxygen  therapy, Routine screening Respiratory Self-Management Outcome: 4 (good)  Endocrine Endocrine Symptoms Reported: No symptoms reported Is patient diabetic?: No    Gastrointestinal Gastrointestinal Symptoms Reported: No symptoms reported      Genitourinary Genitourinary Symptoms Reported: No symptoms reported    Integumentary Integumentary Symptoms Reported: Other Other Integumentary Symptoms: pt reports psoriasis is much improved  continues using triamcinolone  cream Skin  Management Strategies: Adequate rest, Medication therapy, Routine screening Skin Self-Management Outcome: 4 (good) Skin Comment: Reinforced importance of using triamcinolone  cream as prescribed  Musculoskeletal Musculoskelatal Symptoms Reviewed: Unsteady gait Additional Musculoskeletal Details: walker Musculoskeletal Management Strategies: Adequate rest, Routine screening Musculoskeletal Self-Management Outcome: 4 (good) Musculoskeletal Comment: reinforced safety precautions      Psychosocial Psychosocial Symptoms Reported: No symptoms reported         There were no vitals filed for this visit.  Medications Reviewed Today     Reviewed by Aura Mliss LABOR, RN (Registered Nurse) on 02/11/24 at 1012  Med List Status: <None>   Medication Order Taking? Sig Documenting Provider Last Dose Status Informant  acetaminophen  (TYLENOL ) 325 MG tablet [id number]  Take 2 tablets (650 mg total) by mouth every 6 (six) hours as needed for mild pain (pain score 1-3) or fever (or Fever >/= 101). Ricky Fines, MD  Active   acetaminophen  (TYLENOL ) 650 MG suppository [id number]  Place 1 suppository (650 mg total) rectally every 6 (six) hours as needed for mild pain (pain score 1-3) or fever (or Fever >/= 101). Ricky Fines, MD  Active   albuterol  (VENTOLIN  HFA) 108 [phone] Base) MCG/ACT inhaler [id number]  Inhale 2 puffs into the lungs every 6 (six) hours as needed for wheezing or shortness of breath. Pearlean Manus, MD  Active Self, Pharmacy Records  apixaban  (ELIQUIS ) 5 MG TABS tablet [id number]  Take 1 tablet (5 mg total) by mouth 2 (two) times daily. Pearlean Manus, MD  Active Self, Pharmacy Records  clobetasol  cream (TEMOVATE ) 0.05 % [id number]  Apply topically 2 (two) times daily. Ricky Fines, MD  Active   cycloSPORINE  50 mg in dextrose  5 % 250 mL [id number]  Inject 50 mg into  the vein every 12 (twelve) hours. Ricky Fines, MD  Active   diltiazem  (CARTIA  XT) 180 MG 24 hr capsule [id number]  take 1 capsule  (180 milligram total) by mouth daily.  Patient not taking: Reported on 02/03/2024   [name], FNP  Active Self, Pharmacy Records  escitalopram  (LEXAPRO ) 20 MG tablet [id number]  Take 1 tablet (20 mg total) by mouth daily. [name], FNP  Active Self, Pharmacy Records  fluticasone -salmeterol (ADVAIR) 250-50 MCG/ACT AEPB [id number]  inhale 1 puff into the lungs in the morning and at bedtime. [name], FNP  Active Self, Pharmacy Records  furosemide  (LASIX ) 20 MG tablet [id number]  Take 1 tablet (20 mg total) by mouth daily as needed. For Fluid Pearlean Manus, MD  Active Self, Pharmacy Records  hydrOXYzine  (ATARAX ) 10 MG tablet [id number]  Take 1 tablet (10 mg total) by mouth every 6 (six) hours as needed for itching.  Patient not taking: Reported on 02/03/2024   Ricky Fines, MD  Active   metoprolol  tartrate (LOPRESSOR ) 25 MG tablet [id number]  Take 0.5 tablets (12.5 mg total) by mouth 2 (two) times daily.  Patient not taking: Reported on 02/03/2024   Ricky Fines, MD  Active   midodrine  (PROAMATINE ) 5 MG tablet [id number]  Take 1 tablet (5 mg total) by mouth 3 (three) times daily with meals.  Patient not taking: Reported on 02/03/2024   Ricky Fines, MD  Active   nystatin  (MYCOSTATIN /NYSTOP ) powder [id number]  Apply 1 Application topically 3 (three) times daily. [name], FNP  Active Self, Pharmacy Records  pantoprazole  (PROTONIX ) 40 MG tablet [id number]  take 1 tablet (40 milligram total) by mouth daily.  Patient not taking: Reported on 02/03/2024   [name], FNP  Active Self, Pharmacy Records  traMADol  (ULTRAM ) 50 MG tablet [id number]  Take 1 tablet (50 mg total) by mouth every 8 (eight) hours as needed for severe pain (pain score 7-10). Ricky Fines, MD  Active   traZODone  (DESYREL ) 50 MG tablet [id number]  Take 1 tablet (50 mg total) by mouth at bedtime. [name], FNP  Active Self, Pharmacy Records  Med List Note (Ward, Angelica, CPhT 10/22/23 1502): Eliquis  is at  [facility]            Goals Addressed             This Visit's Progress    VBCI Transitions of Care (TOC) Care Plan       Problems:  Recent Hospitalization for treatment of Erythrodemic psoriasis No Hospital Follow Up Provider appointment pt states she prefers to call and schedule her post hospital follow up, declined for RN CM to schedule Pt reports she lives alone, has hired caregiver Monday- Friday, is on oxygen  at 3 liters prn if 02 saturation <90% Pt reports her potassium was elevated but now it's good 02/11/24- pt reports she is doing well, states psoriasis  is much better, continues using triamcinolone  cream as prescribed, pt is not weighing daily  Goal:  Over the next 30 days, the patient will not experience hospital readmission  Interventions:  Transitions of Care: Doctor Visits  - discussed the importance of doctor visits Reviewed Signs and symptoms of infection Evaluation of current treatment plan related to Erythrodemic psoriasis, Inability to perform IADL's independently self-management and patient's adherence to plan as established by provider. Discussed plans with patient for ongoing care management follow up and provided patient with direct contact information for care management team Evaluation of current treatment plan  related to Erythrodemic psoriasis and patient's adherence to plan as established by provider Advised patient to use triamcinolone  cream with moist, warm towels as prescribed per discharge instructions Reviewed medications with patient and discussed importance of taking as prescribed, reviewed medications paused- diltiazem , lisinopril , metoprolol  Reviewed scheduled/upcoming provider appointments including cardiologist 03/09/24, pt reports her daughter knows when appointment with dermatologist will be Discussed plans with patient for ongoing care management follow up and provided patient with direct contact information for care management team Advised  patient to discuss any worsening symptoms of psoriasis with provider  Patient Self Care Activities:  Attend all scheduled provider appointments Call pharmacy for medication refills 3-7 days in advance of running out of medications Call provider office for new concerns or questions  Notify RN Care Manager of TOC call rescheduling needs Participate in Transition of Care Program/Attend TOC scheduled calls Take medications as prescribed   Per your discharge instructions- Apply a thin later of the triamcinolone  to the affected areas. Take clean towels, put them in hot water , wring them out so they are moist, warm, and not dripping, then place them over the triamcinolone  for about 10-30 minutes. The warmth and moisture will make the triamcinolone  penetrate the skin better. After removing the towels, apply another thin layer of the triamcinolone  to help seal the moisture in the skin.   Plan:  Telephone follow up appointment with care management team member scheduled for:  02/17/24 @ 1045 am The patient has been provided with contact information for the care management team and has been advised to call with any health related questions or concerns.          Recommendation:   PCP Follow-up  Follow Up Plan:   Telephone follow-up 02/18/24 @ 1045 am  Mliss Creed [facility], BSN RN Care Manager/ Transition of Care [employer]/ [facility] [phone]

## 2024-02-12 DIAGNOSIS — G9341 Metabolic encephalopathy: Secondary | ICD-10-CM | POA: Diagnosis not present

## 2024-02-12 DIAGNOSIS — I5033 Acute on chronic diastolic (congestive) heart failure: Secondary | ICD-10-CM | POA: Diagnosis not present

## 2024-02-12 DIAGNOSIS — I4891 Unspecified atrial fibrillation: Secondary | ICD-10-CM | POA: Diagnosis not present

## 2024-02-18 ENCOUNTER — Ambulatory Visit

## 2024-02-18 ENCOUNTER — Other Ambulatory Visit: Payer: Self-pay | Admitting: *Deleted

## 2024-02-18 VITALS — BP 139/84 | HR 87 | Ht 64.0 in | Wt 175.0 lb

## 2024-02-18 DIAGNOSIS — Z0000 Encounter for general adult medical examination without abnormal findings: Secondary | ICD-10-CM

## 2024-02-18 NOTE — Patient Instructions (Signed)
 Denise Underwood,  Thank you for taking the time for your Medicare Wellness Visit. I appreciate your continued commitment to your health goals. Please review the care plan we discussed, and feel free to reach out if I can assist you further.  Medicare recommends these wellness visits once per year to help you and your care team stay ahead of potential health issues. These visits are designed to focus on prevention, allowing your provider to concentrate on managing your acute and chronic conditions during your regular appointments.  Please note that Annual Wellness Visits do not include a physical exam. Some assessments may be limited, especially if the visit was conducted virtually. If needed, we may recommend a separate in-person follow-up with your provider.  Ongoing Care Seeing your primary care provider every 3 to 6 months helps us  monitor your health and provide consistent, personalized care.   Referrals If a referral was made during today's visit and you haven't received any updates within two weeks, please contact the referred provider directly to check on the status.  Recommended Screenings:  Health Maintenance  Topic Date Due   COVID-19 Vaccine (3 - Moderna risk series) 03/14/2020   Medicare Annual Wellness Visit  11/07/2023   Flu Shot  12/12/2023   Screening for Lung Cancer  10/13/2024*   DEXA scan (bone density measurement)  07/04/2024   DTaP/Tdap/Td vaccine (2 - Td or Tdap) 12/28/2030   Pneumococcal Vaccine for age over 50  Completed   Hepatitis C Screening  Completed   Zoster (Shingles) Vaccine  Completed   Meningitis B Vaccine  Aged Out   Colon Cancer Screening  Discontinued  *Topic was postponed. The date shown is not the original due date.       02/18/2024    1:00 PM  Advanced Directives  Does Patient Have a Medical Advance Directive? Yes  Type of Estate agent of [place];Living will  Copy of Healthcare Power of Attorney in Chart? Yes - validated  most recent copy scanned in chart (See row information)   Advance Care Planning is important because it: Ensures you receive medical care that aligns with your values, goals, and preferences. Provides guidance to your family and loved ones, reducing the emotional burden of decision-making during critical moments.  Vision: Annual vision screenings are recommended for early detection of glaucoma, cataracts, and diabetic retinopathy. These exams can also reveal signs of chronic conditions such as diabetes and high blood pressure.  Dental: Annual dental screenings help detect early signs of oral cancer, gum disease, and other conditions linked to overall health, including heart disease and diabetes.  Please see the attached documents for additional preventive care recommendations.

## 2024-02-18 NOTE — Progress Notes (Signed)
 Subjective:   Denise Underwood is a 78 y.o. who presents for a Medicare Wellness preventive visit.  As a reminder, Annual Wellness Visits don't include a physical exam, and some assessments may be limited, especially if this visit is performed virtually. We may recommend an in-person follow-up visit with your provider if needed.  Visit Complete: Virtual I connected with  Denise Underwood on 02/18/24 by a audio enabled telemedicine application and verified that I am speaking with the correct person using two identifiers.  Patient Location: Home  Provider Location: Home Office  I discussed the limitations of evaluation and management by telemedicine. The patient expressed understanding and agreed to proceed.  Vital Signs: Because this visit was a virtual/telehealth visit, some criteria may be missing or patient reported. Any vitals not documented were not able to be obtained and vitals that have been documented are patient reported.  VideoDeclined- This patient declined Librarian, academic. Therefore the visit was completed with audio only.  Persons Participating in Visit: Patient.  AWV Questionnaire: No: Patient Medicare AWV questionnaire was not completed prior to this visit.  Cardiac Risk Factors include: advanced age (>63men, >59 women)     Objective:    There were no vitals filed for this visit. There is no height or weight on file to calculate BMI.     02/18/2024    1:00 PM 01/24/2024    9:10 AM 10/22/2023    7:05 PM 10/22/2023    1:37 PM 02/20/2023   11:49 AM 02/02/2023    7:30 PM 01/10/2023    9:55 AM  Advanced Directives  Does Patient Have a Medical Advance Directive? Yes No Yes No No No No  Type of Estate agent of [place];Living will  Healthcare Power of Attorney      Does patient want to make changes to medical advance directive?   No - Patient declined      Copy of Healthcare Power of Attorney in Chart? Yes - validated  most recent copy scanned in chart (See row information)  No - copy requested      Would patient like information on creating a medical advance directive?  No - Patient declined No - Patient declined No - Patient declined  No - Patient declined No - Patient declined    Current Medications (verified) Outpatient Encounter Medications as of 02/18/2024  Medication Sig   acetaminophen  (TYLENOL ) 325 MG tablet Take 2 tablets (650 mg total) by mouth every 6 (six) hours as needed for mild pain (pain score 1-3) or fever (or Fever >/= 101).   acetaminophen  (TYLENOL ) 650 MG suppository Place 1 suppository (650 mg total) rectally every 6 (six) hours as needed for mild pain (pain score 1-3) or fever (or Fever >/= 101).   albuterol  (VENTOLIN  HFA) 108 (90 Base) MCG/ACT inhaler Inhale 2 puffs into the lungs every 6 (six) hours as needed for wheezing or shortness of breath.   apixaban  (ELIQUIS ) 5 MG TABS tablet Take 1 tablet (5 mg total) by mouth 2 (two) times daily.   clobetasol  cream (TEMOVATE ) 0.05 % Apply topically 2 (two) times daily.   cycloSPORINE  50 mg in dextrose  5 % 250 mL Inject 50 mg into the vein every 12 (twelve) hours.   escitalopram  (LEXAPRO ) 20 MG tablet Take 1 tablet (20 mg total) by mouth daily.   fluticasone -salmeterol (ADVAIR) 250-50 MCG/ACT AEPB inhale 1 puff into the lungs in the morning and at bedtime.   furosemide  (LASIX ) 20 MG tablet  Take 1 tablet (20 mg total) by mouth daily as needed. For Fluid   nystatin  (MYCOSTATIN /NYSTOP ) powder Apply 1 Application topically 3 (three) times daily.   pantoprazole  (PROTONIX ) 40 MG tablet take 1 tablet (40 milligram total) by mouth daily.   traZODone  (DESYREL ) 50 MG tablet Take 1 tablet (50 mg total) by mouth at bedtime.   diltiazem  (CARTIA  XT) 180 MG 24 hr capsule take 1 capsule (180 milligram total) by mouth daily. (Patient not taking: Reported on 02/18/2024)   hydrOXYzine  (ATARAX ) 10 MG tablet Take 1 tablet (10 mg total) by mouth every 6 (six) hours as  needed for itching. (Patient not taking: Reported on 02/18/2024)   metoprolol  tartrate (LOPRESSOR ) 25 MG tablet Take 0.5 tablets (12.5 mg total) by mouth 2 (two) times daily. (Patient not taking: Reported on 02/18/2024)   midodrine  (PROAMATINE ) 5 MG tablet Take 1 tablet (5 mg total) by mouth 3 (three) times daily with meals. (Patient not taking: Reported on 02/18/2024)   traMADol  (ULTRAM ) 50 MG tablet Take 1 tablet (50 mg total) by mouth every 8 (eight) hours as needed for severe pain (pain score 7-10). (Patient not taking: Reported on 02/18/2024)   No facility-administered encounter medications on file as of 02/18/2024.    Allergies (verified) Patient has no known allergies.   History: Past Medical History:  Diagnosis Date   Anxiety    Arthritis    Hyperlipidemia    Hypertension    Osteopenia 07/04/2021   Prediabetes 07/09/2021   Psoriasis    Past Surgical History:  Procedure Laterality Date   ABDOMINAL HYSTERECTOMY     CHOLECYSTECTOMY     HERNIA REPAIR     KNEE SURGERY     TUBAL LIGATION     History reviewed. No pertinent family history. Social History   Socioeconomic History   Marital status: Single    Spouse name: Not on file   Number of children: 2   Years of education: Not on file   Highest education level: 9th grade  Occupational History   Occupation: Retired  Tobacco Use   Smoking status: Former    Current packs/day: 0.50    Average packs/day: 0.5 packs/day for 40.0 years (20.0 ttl pk-yrs)    Types: Cigarettes   Smokeless tobacco: Never   Tobacco comments:    Smoking since she was 29 or younger    02/25/2023 Patient stop smoking 01/2023  Vaping Use   Vaping status: Never Used  Substance and Sexual Activity   Alcohol  use: No   Drug use: No   Sexual activity: Not Currently    Birth control/protection: Surgical  Other Topics Concern   Not on file  Social History Narrative   She lives alone on ground level apartment - has children.    Daughter, Karna is  her main caregiver and means for transportation.   She also has an aide that comes in for 2 hours on weekdays to help with bathing, meals, meds etc   Social Drivers of Health   Financial Resource Strain: Low Risk  (02/18/2024)   Overall Financial Resource Strain (CARDIA)    Difficulty of Paying Living Expenses: Not hard at all  Food Insecurity: No Food Insecurity (02/18/2024)   Hunger Vital Sign    Worried About Running Out of Food in the Last Year: Never true    Ran Out of Food in the Last Year: Never true  Transportation Needs: No Transportation Needs (02/18/2024)   PRAPARE - Administrator, Civil Service (Medical):  No    Lack of Transportation (Non-Medical): No  Physical Activity: Insufficiently Active (02/18/2024)   Exercise Vital Sign    Days of Exercise per Week: 3 days    Minutes of Exercise per Session: 30 min  Stress: No Stress Concern Present (02/18/2024)   [employer] of Occupational Health - Occupational Stress Questionnaire    Feeling of Stress: Not at all  Social Connections: Moderately Isolated (02/18/2024)   Social Connection and Isolation Panel    Frequency of Communication with Friends and Family: More than three times a week    Frequency of Social Gatherings with Friends and Family: More than three times a week    Attends Religious Services: 1 to 4 times per year    Active Member of [employer] or Organizations: No    Attends Banker Meetings: Never    Marital Status: Widowed    Tobacco Counseling Counseling given: Not Answered Tobacco comments: Smoking since she was 71 or younger 02/25/2023 Patient stop smoking 01/2023    Clinical Intake:  Pre-visit preparation completed: Yes  Pain : No/denies pain     Nutritional Risks: None Diabetes: No  Lab Results  Component Value Date   HGBA1C 5.2 10/23/2023   HGBA1C 6.2 (H) 02/03/2023   HGBA1C 5.3 01/03/2022     How often do you need to have someone help you when you read  instructions, pamphlets, or other written materials from your doctor or pharmacy?: 1 - Never  Interpreter Needed?: No  Information entered by :: alia t/cma   Activities of Daily Living     02/18/2024   12:59 PM 01/24/2024    3:18 PM  In your present state of health, do you have any difficulty performing the following activities:  Hearing? 0 0  Vision? 0 0  Difficulty concentrating or making decisions? 0 0  Walking or climbing stairs? 0   Dressing or bathing? 1   Doing errands, shopping? 1   Comment pts daughter   Quarry manager and eating ? N   Using the Toilet? N   In the past six months, have you accidently leaked urine? Y   Do you have problems with loss of bowel control? Y   Managing your Medications? Y   Managing your Finances? Y   Comment pts daughter   Housekeeping or managing your Housekeeping? Y     Patient Care Team: [name], FNP as PCP - General (Family Medicine) Kate Lonni CROME, MD as PCP - Cardiology (Cardiology) Orpha Yancey LABOR, MD (Internal Medicine) Lyndol Pama HERO, MD as Consulting Physician (Internal Medicine) Darlean Ozell NOVAK, MD as Consulting Physician (Pulmonary Disease) Aura Mliss LABOR, RN as [facility] Care Management  I have updated your Care Teams any recent Medical Services you may have received from other providers in the past year.     Assessment:   This is a routine wellness examination for [place].  Hearing/Vision screen Hearing Screening - Comments:: Pt state sometimes have a hard time hearing Vision Screening - Comments:: Pt wear glasses/pt goes to [facility] [address] in [place], [place]/last ov a yr ago   Goals Addressed             This Visit's Progress    Prevent falls   On track      Depression Screen     02/18/2024    1:01 PM 02/03/2024    2:04 PM 11/10/2023   10:26 AM 10/29/2023    4:15 PM 10/14/2023    8:56  AM 06/27/2023    9:04 AM 03/28/2023    8:32 AM  PHQ 2/9 Scores  PHQ - 2 Score 2 1  3 3 2 3   PHQ-  9 Score 6   11 11 9 9   Exception Documentation   Patient refusal        Fall Risk     02/18/2024   12:51 PM 02/18/2024   12:50 PM 02/03/2024    2:16 PM 02/03/2024    2:04 PM 11/10/2023   10:26 AM  Fall Risk   Falls in the past year? 0 0  0 0  Number falls in past yr: 0 0 0    Injury with Fall? 0 0     Risk for fall due to : No Fall Risks No Fall Risks Impaired mobility    Follow up Falls evaluation completed Falls evaluation completed Falls evaluation completed;Falls prevention discussed      MEDICARE RISK AT HOME:  Medicare Risk at Home Any stairs in or around the home?: No If so, are there any without handrails?: No Home free of loose throw rugs in walkways, pet beds, electrical cords, etc?: Yes Adequate lighting in your home to reduce risk of falls?: Yes Life alert?: No Use of a cane, walker or w/c?: No Grab bars in the bathroom?: Yes Shower chair or bench in shower?: Yes Elevated toilet seat or a handicapped toilet?: Yes  TIMED UP AND GO:  Was the test performed?  no  Cognitive Function: 6CIT completed        02/18/2024    1:00 PM 11/07/2022    1:26 PM 11/05/2021    1:34 PM 11/02/2019   12:03 PM 10/02/2018    3:58 PM  6CIT Screen  What Year? 0 points 0 points 0 points 0 points 0 points  What month? 0 points 0 points 0 points 0 points 0 points  What time? 0 points 0 points 0 points 0 points 0 points  Count back from 20 0 points 0 points 4 points 0 points 0 points  Months in reverse 0 points 0 points 4 points 4 points 4 points  Repeat phrase 0 points 0 points 4 points 2 points 2 points  Total Score 0 points 0 points 12 points 6 points 6 points    Immunizations Immunization History  Administered Date(s) Administered   Fluad Quad(high Dose 65+) 03/01/2019, 03/01/2020, 04/03/2021, 03/28/2022   Fluad Trivalent(High Dose 65+) 02/05/2023   INFLUENZA, HIGH DOSE SEASONAL PF 02/17/2018   Influenza Inj Mdck Quad Pf 02/26/2017   Moderna Sars-Covid-2 Vaccination 01/18/2020,  02/15/2020   Pneumococcal Conjugate-13 05/20/2018   Pneumococcal Polysaccharide-23 03/01/2019   Tdap 12/27/2020   Zoster Recombinant(Shingrix) 12/27/2020, 04/03/2021    Screening Tests Health Maintenance  Topic Date Due   COVID-19 Vaccine (3 - Moderna risk series) 03/14/2020   Influenza Vaccine  12/12/2023   Lung Cancer Screening  10/13/2024 (Originally 02/16/1995)   DEXA SCAN  07/04/2024   Medicare Annual Wellness (AWV)  02/17/2025   DTaP/Tdap/Td (2 - Td or Tdap) 12/28/2030   Pneumococcal Vaccine: 50+ Years  Completed   Hepatitis C Screening  Completed   Zoster Vaccines- Shingrix  Completed   Meningococcal B Vaccine  Aged Out   Colonoscopy  Discontinued    Health Maintenance Items Addressed: See Nurse Notes at the end of this note  Additional Screening:  Vision Screening: Recommended annual ophthalmology exams for early detection of glaucoma and other disorders of the eye. Is the patient up to date with  their annual eye exam?  Yes  Who is the provider or what is the name of the office in which the patient attends annual eye exams? [facility] in [facility]  Dental Screening: Recommended annual dental exams for proper oral hygiene  Community Resource Referral / Chronic Care Management: CRR required this visit?  No   CCM required this visit?  No   Plan:    I have personally reviewed and noted the following in the patient's chart:   Medical and social history Use of alcohol , tobacco or illicit drugs  Current medications and supplements including opioid prescriptions. Patient is not currently taking opioid prescriptions. Functional ability and status Nutritional status Physical activity Advanced directives List of other physicians Hospitalizations, surgeries, and ER visits in previous 12 months Vitals Screenings to include cognitive, depression, and falls Referrals and appointments  In addition, I have reviewed and discussed with patient certain preventive  protocols, quality metrics, and best practice recommendations. A written personalized care plan for preventive services as well as general preventive health recommendations were provided to patient.   Ozie Ned, CMA   02/18/2024   After Visit Summary: (MyChart) Due to this being a telephonic visit, the after visit summary with patients personalized plan was offered to patient via MyChart   Notes: Nothing significant to report at this time.

## 2024-02-18 NOTE — Patient Outreach (Signed)
 Transition of Care week 3  Visit Note  02/18/2024  Name: Denise Underwood MRN: [id number]          DOB: 04-Mar-1946  Situation: Patient enrolled in [facility] 30-day program. Visit completed with patient by telephone.   Background:    Past Medical History:  Diagnosis Date   Anxiety    Arthritis    Hyperlipidemia    Hypertension    Osteopenia 07/04/2021   Prediabetes 07/09/2021   Psoriasis     Assessment: Patient Reported Symptoms: Cognitive Cognitive Status: No symptoms reported, Alert and oriented to person, place, and time, Able to follow simple commands, Normal speech and language skills      Neurological Neurological Review of Symptoms: No symptoms reported    HEENT HEENT Symptoms Reported: No symptoms reported      Cardiovascular Cardiovascular Symptoms Reported: Swelling in legs or feet (chronic LE bil swelling) Does patient have uncontrolled Hypertension?: No Cardiovascular Management Strategies: Adequate rest, Medication therapy Cardiovascular Self-Management Outcome: 4 (good) Cardiovascular Comment: Reviewed HF action plan, pt is not weighing, RN CM reinforced importance of daily weights and keeping a log  Respiratory Respiratory Symptoms Reported: Shortness of breath Other Respiratory Symptoms: dypsnea with exertion only-   chronic Additional Respiratory Details: oxygen  at 3 liters prn Respiratory Management Strategies: Adequate rest, Oxygen  therapy, Routine screening Respiratory Self-Management Outcome: 4 (good)  Endocrine Endocrine Symptoms Reported: No symptoms reported Is patient diabetic?: No    Gastrointestinal Gastrointestinal Symptoms Reported: No symptoms reported      Genitourinary Genitourinary Symptoms Reported: No symptoms reported    Integumentary Integumentary Symptoms Reported: Other Other Integumentary Symptoms: per pt, psoriasis is better, still has some flaky areas, continues using triamcinolone  cream Skin Management Strategies: Adequate rest,  Medication therapy, Routine screening Skin Self-Management Outcome: 4 (good) Skin Comment: Reviewed importance of using traimcinolone cream as prescribed  Musculoskeletal Musculoskelatal Symptoms Reviewed: Unsteady gait Additional Musculoskeletal Details: walker Musculoskeletal Management Strategies: Adequate rest, Routine screening Musculoskeletal Self-Management Outcome: 4 (good) Musculoskeletal Comment: reviewed safety precautions      Psychosocial Psychosocial Symptoms Reported: No symptoms reported         There were no vitals filed for this visit.  Medications Reviewed Today     Reviewed by Aura Mliss LABOR, RN (Registered Nurse) on 02/18/24 at 1051  Med List Status: <None>   Medication Order Taking? Sig Documenting Provider Last Dose Status Informant  acetaminophen  (TYLENOL ) 325 MG tablet [id number]  Take 2 tablets (650 mg total) by mouth every 6 (six) hours as needed for mild pain (pain score 1-3) or fever (or Fever >/= 101). Ricky Fines, MD  Active   acetaminophen  (TYLENOL ) 650 MG suppository [id number]  Place 1 suppository (650 mg total) rectally every 6 (six) hours as needed for mild pain (pain score 1-3) or fever (or Fever >/= 101). Ricky Fines, MD  Active   albuterol  (VENTOLIN  HFA) 108 [phone] Base) MCG/ACT inhaler [id number]  Inhale 2 puffs into the lungs every 6 (six) hours as needed for wheezing or shortness of breath. Pearlean Manus, MD  Active Self, Pharmacy Records  apixaban  (ELIQUIS ) 5 MG TABS tablet [id number]  Take 1 tablet (5 mg total) by mouth 2 (two) times daily. Pearlean Manus, MD  Active Self, Pharmacy Records  clobetasol  cream (TEMOVATE ) 0.05 % [id number]  Apply topically 2 (two) times daily. Ricky Fines, MD  Active   cycloSPORINE  50 mg in dextrose  5 % 250 mL [id number]  Inject 50 mg into the vein every 12 (twelve) hours. Ricky Fines, MD  Active   diltiazem  (CARTIA  XT) 180 MG 24 hr capsule [id number]  take 1 capsule (180 milligram total) by mouth daily.   Patient not taking: Reported on 02/03/2024   [name], FNP  Active Self, Pharmacy Records  escitalopram  (LEXAPRO ) 20 MG tablet [id number]  Take 1 tablet (20 mg total) by mouth daily. [name], FNP  Active Self, Pharmacy Records  fluticasone -salmeterol (ADVAIR) 250-50 MCG/ACT AEPB [id number]  inhale 1 puff into the lungs in the morning and at bedtime. [name], FNP  Active Self, Pharmacy Records  furosemide  (LASIX ) 20 MG tablet [id number]  Take 1 tablet (20 mg total) by mouth daily as needed. For Fluid Pearlean Manus, MD  Active Self, Pharmacy Records  hydrOXYzine  (ATARAX ) 10 MG tablet [id number]  Take 1 tablet (10 mg total) by mouth every 6 (six) hours as needed for itching.  Patient not taking: Reported on 02/03/2024   Ricky Fines, MD  Active   metoprolol  tartrate (LOPRESSOR ) 25 MG tablet [id number]  Take 0.5 tablets (12.5 mg total) by mouth 2 (two) times daily.  Patient not taking: Reported on 02/03/2024   Ricky Fines, MD  Active   midodrine  (PROAMATINE ) 5 MG tablet [id number]  Take 1 tablet (5 mg total) by mouth 3 (three) times daily with meals.  Patient not taking: Reported on 02/03/2024   Ricky Fines, MD  Active   nystatin  (MYCOSTATIN /NYSTOP ) powder [id number]  Apply 1 Application topically 3 (three) times daily. [name], FNP  Active Self, Pharmacy Records  pantoprazole  (PROTONIX ) 40 MG tablet [id number]  take 1 tablet (40 milligram total) by mouth daily.  Patient not taking: Reported on 02/03/2024   [name], FNP  Active Self, Pharmacy Records  traMADol  (ULTRAM ) 50 MG tablet [id number]  Take 1 tablet (50 mg total) by mouth every 8 (eight) hours as needed for severe pain (pain score 7-10). Ricky Fines, MD  Active   traZODone  (DESYREL ) 50 MG tablet [id number]  Take 1 tablet (50 mg total) by mouth at bedtime. [name], FNP  Active Self, Pharmacy Records  Med List Note (Ward, Angelica, CPhT 10/22/23 1502): Eliquis  is at [facility]            Goals Addressed              This Visit's Progress    VBCI Transitions of Care (TOC) Care Plan       Problems:  Recent Hospitalization for treatment of Erythrodemic psoriasis No Hospital Follow Up Provider appointment pt states she prefers to call and schedule her post hospital follow up, declined for RN CM to schedule Pt reports she lives alone, has hired caregiver Monday- Friday, is on oxygen  at 3 liters prn if 02 saturation <90% Pt reports her potassium was elevated but now it's good 02/18/24- pt reports she is doing well, states psoriasis  is much better, still has few flaky areas, continues using triamcinolone  cream as prescribed, pt is not weighing daily, no new concerns reported  Goal:  Over the next 30 days, the patient will not experience hospital readmission  Interventions:  Transitions of Care: Doctor Visits  - discussed the importance of doctor visits Reviewed Signs and symptoms of infection Evaluation of current treatment plan related to Erythrodemic psoriasis, Inability to perform IADL's independently self-management and patient's adherence to plan as established by provider. Discussed plans with patient for ongoing care management follow up and provided patient with direct contact information for care management team Evaluation of current treatment plan related  to Erythrodemic psoriasis and patient's adherence to plan as established by provider Advised patient to use triamcinolone  cream with moist, warm towels as prescribed per discharge instructions Reviewed medications with patient and discussed importance of taking as prescribed, reviewed medications paused- diltiazem , lisinopril , metoprolol  Reviewed scheduled/upcoming provider appointments including cardiologist 03/09/24, pt reports her daughter knows when appointment with dermatologist will be Discussed plans with patient for ongoing care management follow up and provided patient with direct contact information for care management  team Advised patient to discuss any worsening symptoms of psoriasis with provider Reviewed importance of daily weights  Patient Self Care Activities:  Attend all scheduled provider appointments Call pharmacy for medication refills 3-7 days in advance of running out of medications Call provider office for new concerns or questions  Notify RN Care Manager of TOC call rescheduling needs Participate in Transition of Care Program/Attend TOC scheduled calls Take medications as prescribed   Per your discharge instructions- Apply a thin later of the triamcinolone  to the affected areas. Take clean towels, put them in hot water , wring them out so they are moist, warm, and not dripping, then place them over the triamcinolone  for about 10-30 minutes. The warmth and moisture will make the triamcinolone  penetrate the skin better. After removing the towels, apply another thin layer of the triamcinolone  to help seal the moisture in the skin.   Plan:  Telephone follow up appointment with care management team member scheduled for:  02/25/24 @ 215 pm The patient has been provided with contact information for the care management team and has been advised to call with any health related questions or concerns.         Recommendation:   PCP Follow-up Specialty provider follow-up cardiologist 03/09/24  Follow Up Plan:   Telephone follow-up 02/25/24 @ 215 pm  Mliss Creed [facility], BSN RN Care Manager/ Transition of Care [place]/ [facility] [phone]

## 2024-02-18 NOTE — Patient Instructions (Signed)
 Visit Information  Thank you for taking time to visit with me today. Please don't hesitate to contact me if I can be of assistance to you before our next scheduled telephone appointment.  Our next appointment is by telephone on 02/25/24 @ 215 pm  Following is a copy of your care plan:   Goals Addressed             This Visit's Progress    VBCI Transitions of Care (TOC) Care Plan       Problems:  Recent Hospitalization for treatment of Erythrodemic psoriasis No Hospital Follow Up Provider appointment pt states she prefers to call and schedule her post hospital follow up, declined for RN CM to schedule Pt reports she lives alone, has hired caregiver Monday- Friday, is on oxygen  at 3 liters prn if 02 saturation <90% Pt reports her potassium was elevated but now it's good 02/18/24- pt reports she is doing well, states psoriasis  is much better, still has few flaky areas, continues using triamcinolone  cream as prescribed, pt is not weighing daily, no new concerns reported  Goal:  Over the next 30 days, the patient will not experience hospital readmission  Interventions:  Transitions of Care: Doctor Visits  - discussed the importance of doctor visits Reviewed Signs and symptoms of infection Evaluation of current treatment plan related to Erythrodemic psoriasis, Inability to perform IADL's independently self-management and patient's adherence to plan as established by provider. Discussed plans with patient for ongoing care management follow up and provided patient with direct contact information for care management team Evaluation of current treatment plan related to Erythrodemic psoriasis and patient's adherence to plan as established by provider Advised patient to use triamcinolone  cream with moist, warm towels as prescribed per discharge instructions Reviewed medications with patient and discussed importance of taking as prescribed, reviewed medications paused- diltiazem , lisinopril ,  metoprolol  Reviewed scheduled/upcoming provider appointments including cardiologist 03/09/24, pt reports her daughter knows when appointment with dermatologist will be Discussed plans with patient for ongoing care management follow up and provided patient with direct contact information for care management team Advised patient to discuss any worsening symptoms of psoriasis with provider Reviewed importance of daily weights  Patient Self Care Activities:  Attend all scheduled provider appointments Call pharmacy for medication refills 3-7 days in advance of running out of medications Call provider office for new concerns or questions  Notify RN Care Manager of TOC call rescheduling needs Participate in Transition of Care Program/Attend TOC scheduled calls Take medications as prescribed   Per your discharge instructions- Apply a thin later of the triamcinolone  to the affected areas. Take clean towels, put them in hot water , wring them out so they are moist, warm, and not dripping, then place them over the triamcinolone  for about 10-30 minutes. The warmth and moisture will make the triamcinolone  penetrate the skin better. After removing the towels, apply another thin layer of the triamcinolone  to help seal the moisture in the skin.   Plan:  Telephone follow up appointment with care management team member scheduled for:  02/25/24 @ 215 pm The patient has been provided with contact information for the care management team and has been advised to call with any health related questions or concerns.         Patient verbalizes understanding of instructions and care plan provided today and agrees to view in MyChart. Active MyChart status and patient understanding of how to access instructions and care plan via MyChart confirmed with patient.  Telephone follow up appointment with care management team member scheduled for: 02/25/24 @ 215 pm  Please call the care guide team at [phone] if you  need to cancel or reschedule your appointment.   Please call the Suicide and Crisis Lifeline: 988 call the [place]  National Suicide Prevention Lifeline: 630-587-3509 or TTY: 3045038567 TTY 469-555-7024) to talk to a trained counselor call 1-800-273-TALK (toll free, 24 hour hotline) go to [facility] [address] [place] [phone]) call the [facility] Crisis Line: [phone] call 911 if you are experiencing a Mental Health or Behavioral Health Crisis or need someone to talk to.  Mliss Creed [facility], BSN RN Care Manager/ Transition of Care [place]/ [facility] [phone]

## 2024-02-19 DIAGNOSIS — Z79899 Other long term (current) drug therapy: Secondary | ICD-10-CM | POA: Diagnosis not present

## 2024-02-25 ENCOUNTER — Other Ambulatory Visit: Payer: Self-pay | Admitting: *Deleted

## 2024-02-25 NOTE — Patient Instructions (Signed)
 Visit Information  Thank you for taking time to visit with me today. Please don't hesitate to contact me if I can be of assistance to you before our next scheduled telephone appointment.  Our next appointment is by telephone on 03/04/24 @ 115 pm  Following is a copy of your care plan:   Goals Addressed             This Visit's Progress    VBCI Transitions of Care (TOC) Care Plan       Problems:  Recent Hospitalization for treatment of Erythrodemic psoriasis No Hospital Follow Up Provider appointment pt states she prefers to call and schedule her post hospital follow up, declined for RN CM to schedule Pt reports she lives alone, has hired caregiver Monday- Friday, is on oxygen  at 3 liters prn if 02 saturation <90% Pt reports her potassium was elevated but now it's good 02/25/24- pt reports she is doing well, states psoriasis  is much better, still has few flaky areas, continues using triamcinolone  cream as prescribed, pt is not weighing daily, no new concerns reported  Goal:  Over the next 30 days, the patient will not experience hospital readmission  Interventions:  Transitions of Care: Doctor Visits  - discussed the importance of doctor visits Reviewed Signs and symptoms of infection Evaluation of current treatment plan related to Erythrodemic psoriasis, Inability to perform IADL's independently self-management and patient's adherence to plan as established by provider. Discussed plans with patient for ongoing care management follow up and provided patient with direct contact information for care management team Evaluation of current treatment plan related to Erythrodemic psoriasis and patient's adherence to plan as established by provider Advised patient to use triamcinolone  cream with moist, warm towels as prescribed per discharge instructions Reviewed medications with patient and discussed importance of taking as prescribed, reviewed medications paused- diltiazem , lisinopril ,  metoprolol  Reviewed scheduled/upcoming provider appointments including cardiologist 03/09/24, pt reports her daughter knows when appointment with dermatologist will be Discussed plans with patient for ongoing care management follow up and provided patient with direct contact information for care management team Advised patient to discuss any worsening symptoms of psoriasis with provider Reinforced importance of daily weights  Patient Self Care Activities:  Attend all scheduled provider appointments Call pharmacy for medication refills 3-7 days in advance of running out of medications Call provider office for new concerns or questions  Notify RN Care Manager of TOC call rescheduling needs Participate in Transition of Care Program/Attend TOC scheduled calls Take medications as prescribed   Per your discharge instructions- Apply a thin later of the triamcinolone  to the affected areas. Take clean towels, put them in hot water , wring them out so they are moist, warm, and not dripping, then place them over the triamcinolone  for about 10-30 minutes. The warmth and moisture will make the triamcinolone  penetrate the skin better. After removing the towels, apply another thin layer of the triamcinolone  to help seal the moisture in the skin.   Plan:  Telephone follow up appointment with care management team member scheduled for:  03/04/24 @ 215 pm The patient has been provided with contact information for the care management team and has been advised to call with any health related questions or concerns.         Patient verbalizes understanding of instructions and care plan provided today and agrees to view in MyChart. Active MyChart status and patient understanding of how to access instructions and care plan via MyChart confirmed with patient.  Telephone follow up appointment with care management team member scheduled for:  03/04/24 @ 115 pm  Please call the care guide team at [phone] if you  need to cancel or reschedule your appointment.   Please call the Suicide and Crisis Lifeline: 988 call the [place]  National Suicide Prevention Lifeline: 612-195-0839 or TTY: 4060722485 TTY 802-788-8572) to talk to a trained counselor call 1-800-273-TALK (toll free, 24 hour hotline) go to [facility] [address], [place] [phone]) call the [facility] Crisis Line: [phone] call 911 if you are experiencing a Mental Health or Behavioral Health Crisis or need someone to talk to.  Mliss Creed [facility], BSN RN Care Manager/ Transition of Care [place]/ [facility] [phone]

## 2024-02-25 NOTE — Patient Outreach (Signed)
 Transition of Care week 4  Visit Note  02/25/2024  Name: Denise Underwood MRN: [id number]          DOB: May 29, 1945  Situation: Patient enrolled in [facility] 30-day program. Visit completed with patient by telephone.   Background:  Discharge Date and Diagnosis: 02/02/24, Erythrodemic psoriasis   Past Medical History:  Diagnosis Date   Anxiety    Arthritis    Hyperlipidemia    Hypertension    Osteopenia 07/04/2021   Prediabetes 07/09/2021   Psoriasis     Assessment: Patient Reported Symptoms: Cognitive Cognitive Status: No symptoms reported, Alert and oriented to person, place, and time, Normal speech and language skills, Able to follow simple commands      Neurological Neurological Review of Symptoms: No symptoms reported    HEENT HEENT Symptoms Reported: No symptoms reported      Cardiovascular Cardiovascular Symptoms Reported: Swelling in legs or feet (chronic LE Bil) Does patient have uncontrolled Hypertension?: No Cardiovascular Management Strategies: Adequate rest, Medication therapy Cardiovascular Self-Management Outcome: 4 (good) Cardiovascular Comment: reinforced HF action plan, pt is not weighing, not intereted  Respiratory Respiratory Symptoms Reported: Shortness of breath Other Respiratory Symptoms: dyspnea with exertion only- chronic Additional Respiratory Details: oxygen  at 3 liters prn Respiratory Management Strategies: Adequate rest, Oxygen  therapy, Routine screening Respiratory Self-Management Outcome: 4 (good)  Endocrine Endocrine Symptoms Reported: No symptoms reported Is patient diabetic?: No    Gastrointestinal Gastrointestinal Symptoms Reported: No symptoms reported      Genitourinary Genitourinary Symptoms Reported: No symptoms reported    Integumentary Integumentary Symptoms Reported: Other Other Integumentary Symptoms: pt states psoriasis is much better, has  only a few flaky areas, continues using triamcinolone  cream Skin Management  Strategies: Adequate rest, Medication therapy Skin Self-Management Outcome: 4 (good)  Musculoskeletal Musculoskelatal Symptoms Reviewed: Unsteady gait Additional Musculoskeletal Details: walker Musculoskeletal Management Strategies: Adequate rest, Routine screening Musculoskeletal Self-Management Outcome: 4 (good) Musculoskeletal Comment: reinforced safety precautions      Psychosocial Psychosocial Symptoms Reported: No symptoms reported         There were no vitals filed for this visit.  Medications Reviewed Today     Reviewed by Aura Mliss LABOR, RN (Registered Nurse) on 02/25/24 at 1429  Med List Status: <None>   Medication Order Taking? Sig Documenting Provider Last Dose Status Informant  acetaminophen  (TYLENOL ) 325 MG tablet [id number]  Take 2 tablets (650 mg total) by mouth every 6 (six) hours as needed for mild pain (pain score 1-3) or fever (or Fever >/= 101). Ricky Fines, MD  Active   acetaminophen  (TYLENOL ) 650 MG suppository [id number]  Place 1 suppository (650 mg total) rectally every 6 (six) hours as needed for mild pain (pain score 1-3) or fever (or Fever >/= 101). Ricky Fines, MD  Active   albuterol  (VENTOLIN  HFA) 108 [phone] Base) MCG/ACT inhaler [id number]  Inhale 2 puffs into the lungs every 6 (six) hours as needed for wheezing or shortness of breath. Pearlean Manus, MD  Active Self, Pharmacy Records  apixaban  (ELIQUIS ) 5 MG TABS tablet [id number]  Take 1 tablet (5 mg total) by mouth 2 (two) times daily. Pearlean Manus, MD  Active Self, Pharmacy Records  clobetasol  cream (TEMOVATE ) 0.05 % [id number]  Apply topically 2 (two) times daily. Ricky Fines, MD  Active   cycloSPORINE  50 mg in dextrose  5 % 250 mL [id number]  Inject 50 mg into the vein every 12 (twelve) hours. Ricky Fines, MD  Active   diltiazem  (CARTIA  XT) 180 MG 24 hr capsule [id number]  take 1 capsule (180 milligram total) by mouth daily.  Patient not taking: Reported on 02/18/2024   [name], FNP   Active Self, Pharmacy Records  escitalopram  (LEXAPRO ) 20 MG tablet [id number]  Take 1 tablet (20 mg total) by mouth daily. [name], FNP  Active Self, Pharmacy Records  fluticasone -salmeterol (ADVAIR) 250-50 MCG/ACT AEPB [id number]  inhale 1 puff into the lungs in the morning and at bedtime. [name], FNP  Active Self, Pharmacy Records  furosemide  (LASIX ) 20 MG tablet [id number]  Take 1 tablet (20 mg total) by mouth daily as needed. For Fluid Pearlean Manus, MD  Active Self, Pharmacy Records  hydrOXYzine  (ATARAX ) 10 MG tablet [id number]  Take 1 tablet (10 mg total) by mouth every 6 (six) hours as needed for itching.  Patient not taking: Reported on 02/18/2024   Ricky Fines, MD  Active   metoprolol  tartrate (LOPRESSOR ) 25 MG tablet [id number]  Take 0.5 tablets (12.5 mg total) by mouth 2 (two) times daily.  Patient not taking: Reported on 02/18/2024   Ricky Fines, MD  Active   midodrine  (PROAMATINE ) 5 MG tablet [id number]  Take 1 tablet (5 mg total) by mouth 3 (three) times daily with meals.  Patient not taking: Reported on 02/18/2024   Ricky Fines, MD  Active   nystatin  (MYCOSTATIN /NYSTOP ) powder [id number]  Apply 1 Application topically 3 (three) times daily. [name], FNP  Active Self, Pharmacy Records  pantoprazole  (PROTONIX ) 40 MG tablet [id number]  take 1 tablet (40 milligram total) by mouth daily. [name], FNP  Active Self, Pharmacy Records  traMADol  (ULTRAM ) 50 MG tablet [id number]  Take 1 tablet (50 mg total) by mouth every 8 (eight) hours as needed for severe pain (pain score 7-10).  Patient not taking: Reported on 02/18/2024   Ricky Fines, MD  Active   traZODone  (DESYREL ) 50 MG tablet [id number]  Take 1 tablet (50 mg total) by mouth at bedtime. [name], FNP  Active Self, Pharmacy Records  Med List Note (Ward, Angelica, CPhT 10/22/23 1502): Eliquis  is at [facility]            Goals Addressed             This Visit's Progress    VBCI Transitions of  Care (TOC) Care Plan       Problems:  Recent Hospitalization for treatment of Erythrodemic psoriasis No Hospital Follow Up Provider appointment pt states she prefers to call and schedule her post hospital follow up, declined for RN CM to schedule Pt reports she lives alone, has hired caregiver Monday- Friday, is on oxygen  at 3 liters prn if 02 saturation <90% Pt reports her potassium was elevated but now it's good 02/25/24- pt reports she is doing well, states psoriasis  is much better, still has few flaky areas, continues using triamcinolone  cream as prescribed, pt is not weighing daily, no new concerns reported  Goal:  Over the next 30 days, the patient will not experience hospital readmission  Interventions:  Transitions of Care: Doctor Visits  - discussed the importance of doctor visits Reviewed Signs and symptoms of infection Evaluation of current treatment plan related to Erythrodemic psoriasis, Inability to perform IADL's independently self-management and patient's adherence to plan as established by provider. Discussed plans with patient for ongoing care management follow up and provided patient with direct contact information for care management team Evaluation of current treatment plan related to Erythrodemic psoriasis and patient's adherence to plan as established by provider Advised  patient to use triamcinolone  cream with moist, warm towels as prescribed per discharge instructions Reviewed medications with patient and discussed importance of taking as prescribed, reviewed medications paused- diltiazem , lisinopril , metoprolol  Reviewed scheduled/upcoming provider appointments including cardiologist 03/09/24, pt reports her daughter knows when appointment with dermatologist will be Discussed plans with patient for ongoing care management follow up and provided patient with direct contact information for care management team Advised patient to discuss any worsening symptoms of  psoriasis with provider Reinforced importance of daily weights  Patient Self Care Activities:  Attend all scheduled provider appointments Call pharmacy for medication refills 3-7 days in advance of running out of medications Call provider office for new concerns or questions  Notify RN Care Manager of TOC call rescheduling needs Participate in Transition of Care Program/Attend TOC scheduled calls Take medications as prescribed   Per your discharge instructions- Apply a thin later of the triamcinolone  to the affected areas. Take clean towels, put them in hot water , wring them out so they are moist, warm, and not dripping, then place them over the triamcinolone  for about 10-30 minutes. The warmth and moisture will make the triamcinolone  penetrate the skin better. After removing the towels, apply another thin layer of the triamcinolone  to help seal the moisture in the skin.   Plan:  Telephone follow up appointment with care management team member scheduled for:  03/04/24 @ 215 pm The patient has been provided with contact information for the care management team and has been advised to call with any health related questions or concerns.          Recommendation:   PCP Follow-up  Follow Up Plan:   Telephone follow-up 03/04/24 @ 115 pm  Mliss Creed [facility], BSN RN Care Manager/ Transition of Care [place]/ [facility] [phone]

## 2024-03-03 ENCOUNTER — Other Ambulatory Visit: Payer: Self-pay | Admitting: Family Medicine

## 2024-03-03 DIAGNOSIS — F331 Major depressive disorder, recurrent, moderate: Secondary | ICD-10-CM

## 2024-03-03 DIAGNOSIS — G479 Sleep disorder, unspecified: Secondary | ICD-10-CM

## 2024-03-04 ENCOUNTER — Other Ambulatory Visit: Payer: Self-pay | Admitting: *Deleted

## 2024-03-04 NOTE — Patient Outreach (Signed)
 Transition of Care week 5  Visit Note  03/04/2024  Name: Denise Underwood MRN: [id number]          DOB: 08-03-1945  Situation: Patient enrolled in [facility] 30-day program. Visit completed with patient by telephone.   Background:  Discharge Date and Diagnosis: 02/02/24, Erythrodemic psoriasis   Past Medical History:  Diagnosis Date   Anxiety    Arthritis    Hyperlipidemia    Hypertension    Osteopenia 07/04/2021   Prediabetes 07/09/2021   Psoriasis     Assessment: Patient Reported Symptoms: Cognitive Cognitive Status: No symptoms reported, Alert and oriented to person, place, and time, Normal speech and language skills, Able to follow simple commands      Neurological Neurological Review of Symptoms: No symptoms reported    HEENT HEENT Symptoms Reported: No symptoms reported      Cardiovascular Cardiovascular Symptoms Reported: Swelling in legs or feet (pt states swelling  is much better) Cardiovascular Management Strategies: Adequate rest, Medication therapy Cardiovascular Self-Management Outcome: 4 (good) Cardiovascular Comment: reviewed HF action plan, pt is not weighing daily, not interested  Respiratory Respiratory Symptoms Reported: No symptoms reported Other Respiratory Symptoms: pt has dypsnea with exertion only- chronic Additional Respiratory Details: 02 at 3 liters prn Respiratory Management Strategies: Adequate rest, Routine screening, Oxygen  therapy Respiratory Self-Management Outcome: 4 (good)  Endocrine Endocrine Symptoms Reported: No symptoms reported Is patient diabetic?: No    Gastrointestinal Gastrointestinal Symptoms Reported: No symptoms reported      Genitourinary Genitourinary Symptoms Reported: No symptoms reported    Integumentary Integumentary Symptoms Reported: Other Other Integumentary Symptoms: pt states psoriasis is so much better Skin Management Strategies: Adequate rest, Medication therapy Skin Self-Management Outcome: 4 (good)   Musculoskeletal Musculoskelatal Symptoms Reviewed: Unsteady gait Additional Musculoskeletal Details: walker Musculoskeletal Management Strategies: Adequate rest, Routine screening Musculoskeletal Self-Management Outcome: 4 (good) Musculoskeletal Comment: reviewed safety precautions      Psychosocial Psychosocial Symptoms Reported: No symptoms reported         There were no vitals filed for this visit.  Medications Reviewed Today     Reviewed by Aura Mliss LABOR, RN (Registered Nurse) on 03/04/24 at 1325  Med List Status: <None>   Medication Order Taking? Sig Documenting Provider Last Dose Status Informant  acetaminophen  (TYLENOL ) 325 MG tablet [id number]  Take 2 tablets (650 mg total) by mouth every 6 (six) hours as needed for mild pain (pain score 1-3) or fever (or Fever >/= 101). Ricky Fines, MD  Active   acetaminophen  (TYLENOL ) 650 MG suppository [id number]  Place 1 suppository (650 mg total) rectally every 6 (six) hours as needed for mild pain (pain score 1-3) or fever (or Fever >/= 101). Ricky Fines, MD  Active   albuterol  (VENTOLIN  HFA) [phone] Base) MCG/ACT inhaler [id number]  Inhale 2 puffs into the lungs every 6 (six) hours as needed for wheezing or shortness of breath. Pearlean Manus, MD  Active Self, Pharmacy Records  apixaban  (ELIQUIS ) 5 MG TABS tablet [id number]  Take 1 tablet (5 mg total) by mouth 2 (two) times daily. Pearlean Manus, MD  Active Self, Pharmacy Records  clobetasol  cream (TEMOVATE ) 0.05 % [id number]  Apply topically 2 (two) times daily. Ricky Fines, MD  Active   cycloSPORINE  50 mg in dextrose  5 % 250 mL [id number]  Inject 50 mg into the vein every 12 (twelve) hours. Ricky Fines, MD  Active   diltiazem  (CARTIA  XT) 180 MG 24 hr capsule [id number]  take 1 capsule (180 milligram total) by mouth daily.  Patient not taking: Reported on 02/18/2024   [name], FNP  Active Self, Pharmacy Records  escitalopram  (LEXAPRO ) 20 MG tablet [id number]  Take 1  tablet (20 mg total) by mouth daily. [name], FNP  Active Self, Pharmacy Records  fluticasone -salmeterol (ADVAIR) 250-50 MCG/ACT AEPB [id number]  inhale 1 puff into the lungs in the morning and at bedtime. [name], FNP  Active Self, Pharmacy Records  furosemide  (LASIX ) 20 MG tablet [id number]  Take 1 tablet (20 mg total) by mouth daily as needed. For Fluid Pearlean Manus, MD  Active Self, Pharmacy Records  hydrOXYzine  (ATARAX ) 10 MG tablet [id number]  Take 1 tablet (10 mg total) by mouth every 6 (six) hours as needed for itching.  Patient not taking: Reported on 02/18/2024   Ricky Fines, MD  Active   metoprolol  tartrate (LOPRESSOR ) 25 MG tablet [id number]  Take 0.5 tablets (12.5 mg total) by mouth 2 (two) times daily.  Patient not taking: Reported on 02/18/2024   Ricky Fines, MD  Active   midodrine  (PROAMATINE ) 5 MG tablet [id number]  Take 1 tablet (5 mg total) by mouth 3 (three) times daily with meals.  Patient not taking: Reported on 02/18/2024   Ricky Fines, MD  Active   nystatin  (MYCOSTATIN /NYSTOP ) powder [id number]  Apply 1 Application topically 3 (three) times daily. [name], FNP  Active Self, Pharmacy Records  pantoprazole  (PROTONIX ) 40 MG tablet [id number]  take 1 tablet (40 milligram total) by mouth daily. [name], FNP  Active Self, Pharmacy Records  traMADol  (ULTRAM ) 50 MG tablet [id number]  Take 1 tablet (50 mg total) by mouth every 8 (eight) hours as needed for severe pain (pain score 7-10).  Patient not taking: Reported on 02/18/2024   Ricky Fines, MD  Active   traZODone  (DESYREL ) 50 MG tablet [id number]  take 1 tablet (50 milligram total) by mouth at bedtime. Joesph Annabella HERO, FNP  Active   Med List Note (Ward, Angelica, CPhT 10/22/23 1502): Eliquis  is [facility]            Goals Addressed             This Visit's Progress    COMPLETED: VBCI Transitions of Care (TOC) Care Plan       Problems:  Recent Hospitalization for treatment of  Erythrodemic psoriasis No Hospital Follow Up Provider appointment pt states she prefers to call and schedule her post hospital follow up, declined for RN CM to schedule Pt reports she lives alone, has hired caregiver Monday- Friday, is on oxygen  at 3 liters prn if 02 saturation <90% Pt reports her potassium was elevated but now it's good 03/04/24- pt reports she is doing well, states psoriasis  is so much better, continues using triamcinolone  cream if needed, pt is not weighing daily, no new concerns reported  Goal:  Over the next 30 days, the patient will not experience hospital readmission  Interventions:  Transitions of Care: Doctor Visits  - discussed the importance of doctor visits Reviewed Signs and symptoms of infection Evaluation of current treatment plan related to Erythrodemic psoriasis, Inability to perform IADL's independently self-management and patient's adherence to plan as established by provider. Discussed plans with patient for ongoing care management follow up and provided patient with direct contact information for care management team Evaluation of current treatment plan related to Erythrodemic psoriasis and patient's adherence to plan as established by provider Advised patient to use triamcinolone  cream as prescribed Reviewed medications with patient and discussed importance of  taking as prescribed Reviewed scheduled/upcoming provider appointments including cardiologist 03/09/24, pt reports her daughter knows when appointment with dermatologist will be Discussed plans with patient for ongoing care management follow up and provided patient with direct contact information for care management team Advised patient to discuss any worsening symptoms of psoriasis with provider Reviewed importance of daily weights Reviewed plan of care including case closure, pt declines transfer to longitudinal case manager  Patient Self Care Activities:  Attend all scheduled provider  appointments Call pharmacy for medication refills 3-7 days in advance of running out of medications Call provider office for new concerns or questions  Notify RN Care Manager of TOC call rescheduling needs Participate in Transition of Care Program/Attend TOC scheduled calls Take medications as prescribed   TOC case closure  Plan:  No further follow up required: case closure The patient has been provided with contact information for the care management team and has been advised to call with any health related questions or concerns.         Recommendation:   PCP Follow-up  Follow Up Plan:   Closing From:  Transitions of Care Program  Mliss Creed [facility], BSN RN Care Manager/ Transition of Care [place]/ [facility] Population [facility] [phone]

## 2024-03-04 NOTE — Patient Instructions (Signed)
 Visit Information  Thank you for taking time to visit with me today. Please don't hesitate to contact me if I can be of assistance to you before our next scheduled telephone appointment.  Our next appointment is no further scheduled appointments.    Following is a copy of your care plan:   Goals Addressed             This Visit's Progress    COMPLETED: VBCI Transitions of Care (TOC) Care Plan       Problems:  Recent Hospitalization for treatment of Erythrodemic psoriasis No Hospital Follow Up Provider appointment pt states she prefers to call and schedule her post hospital follow up, declined for RN CM to schedule Pt reports she lives alone, has hired caregiver Monday- Friday, is on oxygen  at 3 liters prn if 02 saturation <90% Pt reports her potassium was elevated but now it's good 03/04/24- pt reports she is doing well, states psoriasis  is so much better, continues using triamcinolone  cream if needed, pt is not weighing daily, no new concerns reported  Goal:  Over the next 30 days, the patient will not experience hospital readmission  Interventions:  Transitions of Care: Doctor Visits  - discussed the importance of doctor visits Reviewed Signs and symptoms of infection Evaluation of current treatment plan related to Erythrodemic psoriasis, Inability to perform IADL's independently self-management and patient's adherence to plan as established by provider. Discussed plans with patient for ongoing care management follow up and provided patient with direct contact information for care management team Evaluation of current treatment plan related to Erythrodemic psoriasis and patient's adherence to plan as established by provider Advised patient to use triamcinolone  cream as prescribed Reviewed medications with patient and discussed importance of taking as prescribed Reviewed scheduled/upcoming provider appointments including cardiologist 03/09/24, pt reports her daughter knows  when appointment with dermatologist will be Discussed plans with patient for ongoing care management follow up and provided patient with direct contact information for care management team Advised patient to discuss any worsening symptoms of psoriasis with provider Reviewed importance of daily weights Reviewed plan of care including case closure, pt declines transfer to longitudinal case manager  Patient Self Care Activities:  Attend all scheduled provider appointments Call pharmacy for medication refills 3-7 days in advance of running out of medications Call provider office for new concerns or questions  Notify RN Care Manager of TOC call rescheduling needs Participate in Transition of Care Program/Attend TOC scheduled calls Take medications as prescribed   TOC case closure  Plan:  No further follow up required: case closure The patient has been provided with contact information for the care management team and has been advised to call with any health related questions or concerns.         Care plan and visit instructions communicated with the patient verbally today. Patient agrees to receive a copy in MyChart. Active MyChart status and patient understanding of how to access instructions and care plan via MyChart confirmed with patient.     No further follow up required: case closure  Please call the care guide team at [phone] if you need to cancel or reschedule your appointment.   Please call the Suicide and Crisis Lifeline: 988 call the [place]  National Suicide Prevention Lifeline: 845-818-7109 or TTY: 405-881-0671 TTY 419-188-6115) to talk to a trained counselor call 1-800-273-TALK (toll free, 24 hour hotline) go to [facility] [address], [place] [phone]) call the [facility] Crisis Line: [phone]  call 911 if you are experiencing a Mental Health or Behavioral Health Crisis or need someone to talk to. Mliss Creed  [facility], BSN RN Care Manager/ Transition of Care [place]/ [facility] [phone]

## 2024-03-07 DIAGNOSIS — J441 Chronic obstructive pulmonary disease with (acute) exacerbation: Secondary | ICD-10-CM | POA: Diagnosis not present

## 2024-03-07 DIAGNOSIS — I4891 Unspecified atrial fibrillation: Secondary | ICD-10-CM | POA: Diagnosis not present

## 2024-03-07 DIAGNOSIS — G9341 Metabolic encephalopathy: Secondary | ICD-10-CM | POA: Diagnosis not present

## 2024-03-07 DIAGNOSIS — I5033 Acute on chronic diastolic (congestive) heart failure: Secondary | ICD-10-CM | POA: Diagnosis not present

## 2024-03-08 NOTE — Progress Notes (Deleted)
 Cardiology Office Note:    Date:  03/08/2024   ID:  Denise Underwood, DOB [birth year], MRN [id number]  PCP:  [name], FNP  Cardiologist:  Lonni LITTIE [name], MD  Electrophysiologist:  None   Referring MD: [name], FNP   No chief complaint on file. ***  History of Present Illness:    Denise Underwood is a 78 y.o. female with a hx of paroxysmal atrial fibrillation, aortic stenosis, hypertension, hyperlipidemia, COPD, tobacco use presents for follow-up.  She was admitted 01/2023 with septic shock due to pneumonia, which was complicated by new onset atrial fibrillation.  Echocardiogram 02/04/2023 showed EF 60 to 65%, normal RV function, mild aortic valve stenosis.  She was started on diltiazem , metoprolol  and Eliquis .  Converted to normal sinus rhythm.  Since last clinic visit,  Past Medical History:  Diagnosis Date   Anxiety    Arthritis    Hyperlipidemia    Hypertension    Osteopenia 07/04/2021   Prediabetes 07/09/2021   Psoriasis     Past Surgical History:  Procedure Laterality Date   ABDOMINAL HYSTERECTOMY     CHOLECYSTECTOMY     HERNIA REPAIR     KNEE SURGERY     TUBAL LIGATION      Current Medications: No outpatient medications have been marked as taking for the 03/09/24 encounter (Appointment) with [name] Lonni LITTIE, MD.     Allergies:   Patient has no known allergies.   Social History   Socioeconomic History   Marital status: Single    Spouse name: Not on file   Number of children: 2   Years of education: Not on file   Highest education level: 9th grade  Occupational History   Occupation: Retired  Tobacco Use   Smoking status: Former    Current packs/day: 0.50    Average packs/day: 0.5 packs/day for 40.0 years (20.0 ttl pk-yrs)    Types: Cigarettes   Smokeless tobacco: Never   Tobacco comments:    Smoking since she was 29 or younger    02/25/2023 Patient stop smoking 01/2023  Vaping Use   Vaping status: Never Used  Substance and  Sexual Activity   Alcohol  use: No   Drug use: No   Sexual activity: Not Currently    Birth control/protection: Surgical  Other Topics Concern   Not on file  Social History Narrative   She lives alone on ground level apartment - has children.    Daughter, Karna is her main caregiver and means for transportation.   She also has an aide that comes in for 2 hours on weekdays to help with bathing, meals, meds etc   Social Drivers of Health   Financial Resource Strain: Low Risk  (02/18/2024)   Overall Financial Resource Strain (CARDIA)    Difficulty of Paying Living Expenses: Not hard at all  Food Insecurity: No Food Insecurity (02/18/2024)   Hunger Vital Sign    Worried About Running Out of Food in the Last Year: Never true    Ran Out of Food in the Last Year: Never true  Transportation Needs: No Transportation Needs (02/18/2024)   PRAPARE - Administrator, Civil Service (Medical): No    Lack of Transportation (Non-Medical): No  Physical Activity: Insufficiently Active (02/18/2024)   Exercise Vital Sign    Days of Exercise per Week: 3 days    Minutes of Exercise per Session: 30 min  Stress: No Stress Concern Present (02/18/2024)   [employer] of  Occupational Health - Occupational Stress Questionnaire    Feeling of Stress: Not at all  Social Connections: Moderately Isolated (02/18/2024)   Social Connection and Isolation Panel    Frequency of Communication with Friends and Family: More than three times a week    Frequency of Social Gatherings with Friends and Family: More than three times a week    Attends Religious Services: 1 to 4 times per year    Active Member of [employer] or Organizations: No    Attends Banker Meetings: Never    Marital Status: Widowed     Family History: The patient's ***family history is not on file.  ROS:   Please see the history of present illness.    *** All other systems reviewed and are negative.  EKGs/Labs/Other Studies  Reviewed:    The following studies were reviewed today: ***  EKG:  EKG is *** ordered today.  The ekg ordered today demonstrates ***  Recent Labs: 10/22/2023: B Natriuretic Peptide 58.0 11/10/2023: ALT 14 01/24/2024: TSH 1.849 01/25/2024: Magnesium  1.6 01/27/2024: BUN 29; Creatinine, Ser 1.25; Hemoglobin 8.6; Platelets 315; Potassium 5.1; Sodium 138  Recent Lipid Panel    Component Value Date/Time   CHOL 257 (H) 10/14/2023 0915   TRIG 231 (H) 10/14/2023 0915   HDL 48 10/14/2023 0915   CHOLHDL 5.4 (H) 10/14/2023 0915   LDLCALC 166 (H) 10/14/2023 0915    Physical Exam:    VS:  There were no vitals taken for this visit.    Wt Readings from Last 3 Encounters:  02/18/24 175 lb (79.4 kg)  01/28/24 197 lb 8.5 oz (89.6 kg)  11/10/23 175 lb (79.4 kg)     GEN: *** Well nourished, well developed in no acute distress HEENT: Normal NECK: No JVD; No carotid bruits LYMPHATICS: No lymphadenopathy CARDIAC: ***RRR, no murmurs, rubs, gallops RESPIRATORY:  Clear to auscultation without rales, wheezing or rhonchi  ABDOMEN: Soft, non-tender, non-distended MUSCULOSKELETAL:  No edema; No deformity  SKIN: Warm and dry NEUROLOGIC:  Alert and oriented x 3 PSYCHIATRIC:  Normal affect   ASSESSMENT:    No diagnosis found. PLAN:    Paroxysmal atrial fibrillation: Occurred in setting of septic shock 01/2023. - Continue Eliquis  5 mg twice daily - She is currently on metoprolol  25 mg twice daily and diltiazem  180 mg daily.  Will discontinue diltiazem ***  Chronic diastolic heart failure: Echocardiogram 02/04/2023 showed EF 60 to 65%, normal RV function, mild aortic valve stenosis. - Takes Lasix  20 mg daily as needed  CKD stage IIIb: Creatinine 1.5 03/2023.  Follows with nephrology  Aortic stenosis: Mild on echocardiogram 01/2023.  Will monitor  Hypertension: On metoprolol  and diltiazem  as above.  Midodrine ***  Hyperlipidemia: Previously on rosuvastatin  but held with transaminitis during episode  of sepsis.  Calcium  score***   Medication Adjustments/Labs and Tests Ordered: Current medicines are reviewed at length with the patient today.  Concerns regarding medicines are outlined above.  No orders of the defined types were placed in this encounter.  No orders of the defined types were placed in this encounter.   There are no Patient Instructions on file for this visit.   Signed, Lonni LITTIE [name], MD  03/08/2024 10:31 PM    [facility]

## 2024-03-09 ENCOUNTER — Ambulatory Visit: Admitting: Cardiology

## 2024-03-09 DIAGNOSIS — I129 Hypertensive chronic kidney disease with stage 1 through stage 4 chronic kidney disease, or unspecified chronic kidney disease: Secondary | ICD-10-CM | POA: Diagnosis not present

## 2024-03-09 DIAGNOSIS — I480 Paroxysmal atrial fibrillation: Secondary | ICD-10-CM | POA: Diagnosis not present

## 2024-03-09 DIAGNOSIS — N1832 Chronic kidney disease, stage 3b: Secondary | ICD-10-CM | POA: Diagnosis not present

## 2024-03-09 DIAGNOSIS — I5032 Chronic diastolic (congestive) heart failure: Secondary | ICD-10-CM | POA: Diagnosis not present

## 2024-03-14 DIAGNOSIS — I4891 Unspecified atrial fibrillation: Secondary | ICD-10-CM | POA: Diagnosis not present

## 2024-03-14 DIAGNOSIS — I5033 Acute on chronic diastolic (congestive) heart failure: Secondary | ICD-10-CM | POA: Diagnosis not present

## 2024-03-14 DIAGNOSIS — G9341 Metabolic encephalopathy: Secondary | ICD-10-CM | POA: Diagnosis not present

## 2024-03-19 ENCOUNTER — Encounter: Payer: Self-pay | Admitting: *Deleted

## 2024-03-23 ENCOUNTER — Ambulatory Visit: Attending: Emergency Medicine | Admitting: Emergency Medicine

## 2024-03-23 ENCOUNTER — Encounter: Payer: Self-pay | Admitting: Emergency Medicine

## 2024-03-23 VITALS — BP 120/70 | HR 112 | Ht 64.0 in | Wt 178.0 lb

## 2024-03-23 DIAGNOSIS — I350 Nonrheumatic aortic (valve) stenosis: Secondary | ICD-10-CM | POA: Diagnosis not present

## 2024-03-23 DIAGNOSIS — I480 Paroxysmal atrial fibrillation: Secondary | ICD-10-CM

## 2024-03-23 DIAGNOSIS — I959 Hypotension, unspecified: Secondary | ICD-10-CM

## 2024-03-23 DIAGNOSIS — E782 Mixed hyperlipidemia: Secondary | ICD-10-CM | POA: Diagnosis not present

## 2024-03-23 DIAGNOSIS — I5032 Chronic diastolic (congestive) heart failure: Secondary | ICD-10-CM

## 2024-03-23 DIAGNOSIS — I10 Essential (primary) hypertension: Secondary | ICD-10-CM

## 2024-03-23 DIAGNOSIS — E875 Hyperkalemia: Secondary | ICD-10-CM | POA: Diagnosis not present

## 2024-03-23 MED ORDER — METOPROLOL TARTRATE 25 MG PO TABS: 12.5000 mg | ORAL_TABLET | Freq: Two times a day (BID) | ORAL | 3 refills | Status: DC

## 2024-03-23 NOTE — Progress Notes (Addendum)
 Cardiology Office Note:    Date:  03/23/2024  ID:  Denise Underwood, DOB 01/16/1946, MRN [id number] PCP: [name], FNP  [facility] Providers Cardiologist:  Lonni LITTIE [name], MD Cardiology APP:  Rana Lum LITTIE, NP       Patient Profile:       Chief Complaint: Hospital follow-up History of Present Illness:  Denise Underwood is a 78 y.o. female with visit-pertinent history of paroxysmal atrial fibrillation, aortic stenosis, hypertension, hyperlipidemia, COPD, tobacco use presents for follow-up.    Patient was admitted in 01/2023 with septic shock, community acquired pneumonia. Treated with antibiotics, steroids, and supplemental oxygen . While admitted, patient was found to have new onset atrial fibrillation. Cardiology was consulted. Echocardiogram 02/04/23 showed EF 60-65%, no regional wall motion abnormalities, grade I DD, normal RV function, normal PA systolic pressure, mid aortic valve stenosis. Her troponins were elevated, and it was thought to to be demand ischemia in the setting of septic shock, respiratory failure, and afib with RVR. She was started on diltiazem , metoprolol , and eliquis . Converted to NSR.   She was last seen in clinic on 06/13/2023.  Patient was doing well overall without acute complaints.  No medication changes or interventions are warranted.  She was maintaining normal sinus rhythm.  She was to follow-up in 4 months.    Patient was admitted to the [facility] hospital on 9/13 through 01/28/2024 for SIRS and psoriasis flare.  Found to be in rate controlled atrial fibrillation on admission.  She was found to be hypotensive and started on midodrine . She was then transferred to [facility] to establish with dermatology.  She was found to be hypotensive during admission with [facility] with blood pressures as low as 95/51 and her diltiazem , lisinopril , and metoprolol  were all held at discharge.  She also had hyperkalemia without obvious cause and received Lokelma.   She remained in atrial fibrillation throughout her admission.   Discussed the use of AI scribe software for clinical note transcription with the patient, who gave verbal consent to proceed.  History of Present Illness Denise Underwood is a 78 year old female with atrial fibrillation who presents for follow-up of her condition.  Today patient is doing well without acute cardiovascular concerns.  She denies any chest pains or palpitations.  She is currently unaware of her arrhythmia without complaints of chest fluttering or tachycardia.  She will occasionally have dyspnea that is chronic and unchanged.  No LEE, orthopnea, or PND.  She experienced nausea and vomiting earlier today, which she attributes to taking her medications on an empty stomach. She had to pull over while driving due to the nausea.  She continues to take Lasix  as needed, using it when she notices swelling, which occurred three or four times on average every week.  She denies melena, hematochezia, lightheadedness, dizziness, syncope, presyncope   Review of systems:  Please see the history of present illness. All other systems are reviewed and otherwise negative.      Studies Reviewed:    EKG Interpretation Date/Time:  Tuesday March 23 2024 10:55:53 EST Ventricular Rate:  112 PR Interval:    QRS Duration:  76 QT Interval:  308 QTC Calculation: 420 R Axis:   11  Text Interpretation: Atrial fibrillation Confirmed by Rana Lum [phone]) on 03/23/2024 12:16:55 PM    Echocardiogram 02/04/2023  1. Left ventricular ejection fraction, by estimation, is 60 to 65%. The  left ventricle has normal function. The left ventricle has no regional  wall  motion abnormalities. Left ventricular diastolic parameters are  consistent with Grade I diastolic  dysfunction (impaired relaxation).   2. Right ventricular systolic function is normal. The right ventricular  size is mildly enlarged. There is normal pulmonary artery  systolic  pressure.   3. Left atrial size was mildly dilated.   4. Right atrial size was mildly dilated.   5. The mitral valve is degenerative. No evidence of mitral valve  regurgitation. No evidence of mitral stenosis.   6. The aortic valve is calcified. Aortic valve regurgitation is not  visualized. Mild aortic valve stenosis. Aortic valve mean gradient  measures 14.0 mmHg. Aortic valve Vmax measures 2.48 m/s.   7. The inferior vena cava is dilated in size with <50% respiratory  variability, suggesting right atrial pressure of 15 mmHg.   Risk Assessment/Calculations:    CHA2DS2-VASc Score = 6   This indicates a 9.7% annual risk of stroke. The patient's score is based upon: CHF History: 1 HTN History: 1 Diabetes History: 0 Stroke History: 0 Vascular Disease History: 1 Age Score: 2 Gender Score: 1             Physical Exam:   VS:  BP 120/70 (BP Location: Right Arm, Patient Position: Sitting, Cuff Size: Normal)   Pulse (!) 112   Ht 5' 4 (1.626 m)   Wt 178 lb (80.7 kg)   BMI 30.55 kg/m    Wt Readings from Last 3 Encounters:  03/23/24 178 lb (80.7 kg)  02/18/24 175 lb (79.4 kg)  01/28/24 197 lb 8.5 oz (89.6 kg)    GEN: Well nourished, well developed in no acute distress NECK: No JVD; No carotid bruits CARDIAC: Irregular irregular rhythm.  No murmurs, rubs, gallops RESPIRATORY:  Clear to auscultation without rales, wheezing or rhonchi  ABDOMEN: Soft, non-tender, non-distended EXTREMITIES:  No edema; No acute deformity      Assessment and Plan:  Paroxysmal atrial fibrillation Diagnosed 01/2023 while admitted with septic shock and converted to NSR Admitted 01/2024 for psoriasis flare and SIRS and found to be in atrial fibrillation with controlled rate on admission.  She seemed to remain rate controlled.  She did experience hypotension and her metoprolol  and diltiazem  were held at discharge - Today EKG shows atrial fibrillation with heart rate 112 bpm - Seems she has  been in persistent atrial fibrillation since at least 01/2024 - She is currently cardiac unaware of her arrhythmia without c/o palpitations or irregular heart rates - Her heart rate was elevated today at 112.  I will reintroduce metoprolol  tartrate 12.5 mg twice daily.  Will continue to hold diltiazem  until we see how blood pressure responds.  Hypotension likely in the setting of SIRS - I will refer to atrial fibrillation clinic for ongoing management - Continue Eliquis  5 mg twice daily  Hypotension, resolved History of hypertension During admission on 01/2024 hypotension thought to be associated with systemic inflammation either from severe psoriasis flare versus sepsis secondary to superimposed infection on underlying psoriasis and lisinopril , diltiazem , metoprolol  held - No longer on midodrine  - Blood pressure today well-controlled at 120/70  - Reintroducing metoprolol  tartrate 12.5 mg twice daily as noted above - Continue to monitor at home  Hyperkalemia Patient presented on 01/2024 on admission with hyperkalemia without obvious cause.  Received Lokelma - Potassium 5.4 on 9/22 - Repeat BMET today - Follow-up with nephrology  HFpEF Echocardiogram 01/2023 with LVEF 60 to 65%, no RWMA, grade 1 DD - Today she appears euvolemic and well compensated on exam  with stable volume status  -No changes to current regimen - Continue furosemide  20 mg daily as needed (currently taking around 4 times per week)  Mild aortic stenosis Echocardiogram 01/2023 showed mild aortic valve stenosis with mean gradient 14 mmHg - Today she remains asymptomatic without exertional symptoms.  No chest pain, dyspnea or syncope - Will plan to repeat echocardiogram at follow-up visit for routine monitoring  Hyperlipidemia Was previously on rosuvastatin  but was held after patient had acute transaminitis in setting of sepsis - LFTs have resolved on 10/2023 - Will hold off on addition of statin lowering therapy given recent  admission for SIRS/psoriasis flare  CKD  Creatinine 0.98 and GFR 59 on 9/22 - Stable and well-controlled - Managed by nephrology      Dispo:  Return in about 3 months (around 06/23/2024).  Signed, Lum LITTIE Louis, NP

## 2024-03-23 NOTE — Patient Instructions (Addendum)
 Medication Instructions:  START BACK TAKING METOPROLOL  TARTRATE 12.5 (1/2 TABLET) DAILY.  Lab Work: NONE TO BE DONE TODAY.  Testing/Procedures: NONE  Follow-Up: At [facility], you and your health needs are our priority.  As part of our continuing mission to provide you with exceptional heart care, our providers are all part of one team.  This team includes your primary Cardiologist (physician) and Advanced Practice Providers or APPs (Physician Assistants and Nurse Practitioners) who all work together to provide you with the care you need, when you need it.  Your next appointment:   3 MONTHS WITH MADISON FOUNTAIN, NP  1-2 WEEKS WITH THE AFIB CLINIC.  Provider:   Lonni LITTIE [name], MD      Other Instructions: A REFERRAL TO THE AFIB CLINIC HAS BEEN OUT IN AND SOMEONE FROM OFFICE WILL CONTACT YOU TO GET YOU SCHEDULED.

## 2024-03-24 DIAGNOSIS — E6609 Other obesity due to excess calories: Secondary | ICD-10-CM | POA: Diagnosis not present

## 2024-03-24 DIAGNOSIS — E875 Hyperkalemia: Secondary | ICD-10-CM | POA: Diagnosis not present

## 2024-03-24 DIAGNOSIS — E782 Mixed hyperlipidemia: Secondary | ICD-10-CM | POA: Diagnosis not present

## 2024-03-24 DIAGNOSIS — M170 Bilateral primary osteoarthritis of knee: Secondary | ICD-10-CM | POA: Diagnosis not present

## 2024-03-24 DIAGNOSIS — I5032 Chronic diastolic (congestive) heart failure: Secondary | ICD-10-CM | POA: Diagnosis not present

## 2024-03-24 DIAGNOSIS — I959 Hypotension, unspecified: Secondary | ICD-10-CM | POA: Diagnosis not present

## 2024-03-24 DIAGNOSIS — L409 Psoriasis, unspecified: Secondary | ICD-10-CM | POA: Diagnosis not present

## 2024-03-24 DIAGNOSIS — I10 Essential (primary) hypertension: Secondary | ICD-10-CM | POA: Diagnosis not present

## 2024-03-24 DIAGNOSIS — I350 Nonrheumatic aortic (valve) stenosis: Secondary | ICD-10-CM | POA: Diagnosis not present

## 2024-03-24 DIAGNOSIS — Z79899 Other long term (current) drug therapy: Secondary | ICD-10-CM | POA: Diagnosis not present

## 2024-03-24 DIAGNOSIS — I480 Paroxysmal atrial fibrillation: Secondary | ICD-10-CM | POA: Diagnosis not present

## 2024-03-24 DIAGNOSIS — R110 Nausea: Secondary | ICD-10-CM | POA: Diagnosis not present

## 2024-03-25 ENCOUNTER — Ambulatory Visit: Payer: Self-pay | Admitting: Emergency Medicine

## 2024-03-25 LAB — BASIC METABOLIC PANEL WITH GFR
BUN/Creatinine Ratio: 13 (ref 12–28)
BUN: 13 mg/dL (ref 8–27)
CO2: 27 mmol/L (ref 20–29)
Calcium: 9.4 mg/dL (ref 8.7–10.3)
Chloride: 99 mmol/L (ref 96–106)
Creatinine, Ser: 1.01 mg/dL — ABNORMAL HIGH (ref 0.57–1.00)
Glucose: 107 mg/dL — ABNORMAL HIGH (ref 70–99)
Potassium: 4.9 mmol/L (ref 3.5–5.2)
Sodium: 141 mmol/L (ref 134–144)
eGFR: 57 mL/min/1.73 — ABNORMAL LOW (ref >59)

## 2024-03-25 LAB — CBC
Hematocrit: 34.3 % (ref 34.0–46.6)
Hemoglobin: 11 g/dL — ABNORMAL LOW (ref 11.1–15.9)
MCH: 31.7 pg (ref 26.6–33.0)
MCHC: 32.1 g/dL (ref 31.5–35.7)
MCV: 99 fL — ABNORMAL HIGH (ref 79–97)
Platelets: 271 x10E3/uL (ref 150–450)
RBC: 3.47 x10E6/uL — ABNORMAL LOW (ref 3.77–5.28)
RDW: 12.8 % (ref 11.7–15.4)
WBC: 12.9 x10E3/uL — ABNORMAL HIGH (ref 3.4–10.8)

## 2024-03-31 ENCOUNTER — Ambulatory Visit (HOSPITAL_COMMUNITY)
Admission: RE | Admit: 2024-03-31 | Discharge: 2024-03-31 | Disposition: A | Discharge status: Home / Self Care | Source: Ambulatory Visit | Attending: Internal Medicine | Admitting: Internal Medicine

## 2024-03-31 ENCOUNTER — Encounter (HOSPITAL_COMMUNITY): Payer: Self-pay | Admitting: Internal Medicine

## 2024-03-31 VITALS — BP 132/80 | HR 64 | Ht 64.0 in | Wt 179.8 lb

## 2024-03-31 DIAGNOSIS — I4819 Other persistent atrial fibrillation: Secondary | ICD-10-CM | POA: Diagnosis not present

## 2024-03-31 DIAGNOSIS — D6869 Other thrombophilia: Secondary | ICD-10-CM | POA: Diagnosis not present

## 2024-03-31 DIAGNOSIS — I480 Paroxysmal atrial fibrillation: Secondary | ICD-10-CM | POA: Diagnosis not present

## 2024-03-31 NOTE — Patient Instructions (Signed)
 Take metoprolol  tartrate (LOPRESSOR ) 12.5 mg (1/2 tab ) twice a day

## 2024-03-31 NOTE — Progress Notes (Signed)
 Primary Care Physician: [name], FNP Primary Cardiologist: Lonni LITTIE [name], MD Electrophysiologist: None     Referring Physician: Rana Lum LITTIE, NP     Denise Underwood is a 78 y.o. female with a history of aortic stenosis, chronic diastolic CHF, hypertension, hyperlipidemia, COPD, tobacco use, and atrial fibrillation who presents for consultation in the [facility].  Outside hospital admission September 2025 for psoriasis exacerbation with concern for cellulitis found to be in A-fib on admission.  Seen by cardiology on 11/11 and noted to be in A-fib with RVR.  Patient is on Eliquis  for stroke prevention.  On evaluation today, patient is currently in NSR. She is surprised to find out she is in normal rhythm.  She does not have cardiac awareness.  She is taking Lopressor  12.5 mg once daily.  No missed doses of Eliquis .  Today, she denies symptoms of palpitations, chest pain, shortness of breath, orthopnea, PND, lower extremity edema, dizziness, presyncope, syncope, bleeding, or neurologic sequela. The patient is tolerating medications without difficulties and is otherwise without complaint today.   she has a BMI of Body mass index is 30.86 kg/m.SABRA Filed Weights   03/31/24 0858  Weight: 81.6 kg    Current Outpatient Medications  Medication Sig Dispense Refill   acetaminophen  (TYLENOL ) 325 MG tablet Take 2 tablets (650 mg total) by mouth every 6 (six) hours as needed for mild pain (pain score 1-3) or fever (or Fever >/= 101).     acetaminophen  (TYLENOL ) 650 MG suppository Place 1 suppository (650 mg total) rectally every 6 (six) hours as needed for mild pain (pain score 1-3) or fever (or Fever >/= 101).     albuterol  (VENTOLIN  HFA) 108 (90 Base) MCG/ACT inhaler Inhale 2 puffs into the lungs every 6 (six) hours as needed for wheezing or shortness of breath. 8 g 4   apixaban  (ELIQUIS ) 5 MG TABS tablet Take 1 tablet (5 mg total) by mouth 2 (two)  times daily. 180 tablet 1   clobetasol  cream (TEMOVATE ) 0.05 % Apply topically 2 (two) times daily. 30 g 0   cycloSPORINE  50 mg in dextrose  5 % 250 mL Inject 50 mg into the vein every 12 (twelve) hours.     escitalopram  (LEXAPRO ) 20 MG tablet Take 1 tablet (20 mg total) by mouth daily. 90 tablet 1   fluticasone -salmeterol (ADVAIR) 250-50 MCG/ACT AEPB inhale 1 puff into the lungs in the morning and at bedtime. 30 each 5   furosemide  (LASIX ) 20 MG tablet Take 1 tablet (20 mg total) by mouth daily as needed. For Fluid 90 tablet 1   hydrOXYzine  (ATARAX ) 10 MG tablet Take 1 tablet (10 mg total) by mouth every 6 (six) hours as needed for itching.     metoprolol  tartrate (LOPRESSOR ) 25 MG tablet Take 0.5 tablets (12.5 mg total) by mouth 2 (two) times daily. 180 tablet 3   nystatin  (MYCOSTATIN /NYSTOP ) powder Apply 1 Application topically 3 (three) times daily. 60 g 0   pantoprazole  (PROTONIX ) 40 MG tablet take 1 tablet (40 milligram total) by mouth daily. 30 tablet 5   traMADol  (ULTRAM ) 50 MG tablet Take 1 tablet (50 mg total) by mouth every 8 (eight) hours as needed for severe pain (pain score 7-10).     traZODone  (DESYREL ) 50 MG tablet take 1 tablet (50 milligram total) by mouth at bedtime. 90 tablet 0   No current facility-administered medications for this encounter.    Atrial Fibrillation Management history:  Previous antiarrhythmic  drugs: none Previous cardioversions: none Previous ablations: none Anticoagulation history: Eliquis    ROS- All systems are reviewed and negative except as per the HPI above.  Physical Exam: BP 132/80   Pulse 64   Ht 5' 4 (1.626 m)   Wt 81.6 kg   BMI 30.86 kg/m   GEN: Well nourished, well developed in no acute distress NECK: No JVD; No carotid bruits CARDIAC: Regular rate and rhythm, no murmurs, rubs, gallops RESPIRATORY:  Clear to auscultation without rales, wheezing or rhonchi  ABDOMEN: Soft, non-tender, non-distended EXTREMITIES:  No edema; No  deformity   EKG today demonstrates  Vent. rate 64 BPM PR interval 152 ms QRS duration 88 ms QT/QTcB 448/462 ms P-R-T axes 70 36 61 Normal sinus rhythm Normal ECG When compared with ECG of 23-Mar-2024 10:55, Sinus rhythm has replaced Atrial flutter  Echo 02/04/23 demonstrated  1. Left ventricular ejection fraction, by estimation, is 60 to 65%. The  left ventricle has normal function. The left ventricle has no regional  wall motion abnormalities. Left ventricular diastolic parameters are  consistent with Grade I diastolic  dysfunction (impaired relaxation).   2. Right ventricular systolic function is normal. The right ventricular  size is mildly enlarged. There is normal pulmonary artery systolic  pressure.   3. Left atrial size was mildly dilated.   4. Right atrial size was mildly dilated.   5. The mitral valve is degenerative. No evidence of mitral valve  regurgitation. No evidence of mitral stenosis.   6. The aortic valve is calcified. Aortic valve regurgitation is not  visualized. Mild aortic valve stenosis. Aortic valve mean gradient  measures 14.0 mmHg. Aortic valve Vmax measures 2.48 m/s.   7. The inferior vena cava is dilated in size with <50% respiratory  variability, suggesting right atrial pressure of 15 mmHg.   ASSESSMENT & PLAN CHA2DS2-VASc Score = 6  The patient's score is based upon: CHF History: 1 HTN History: 1 Diabetes History: 0 Stroke History: 0 Vascular Disease History: 1 Age Score: 2 Gender Score: 1       ASSESSMENT AND PLAN: Persistent Atrial Fibrillation (ICD10:  I48.19) The patient's CHA2DS2-VASc score is 6, indicating a 9.7% annual risk of stroke.    Patient is currently in NSR.  Education provided on her metoprolol  medication and the reasoning behind taking it twice daily.  She did not realize it was supposed to be twice daily and so was just accidentally taking it once a day.  She will continue Lopressor  12.5 mg twice a day.  We briefly  discussed options for treatment going forward in the future but patient may elect to proceed with conservative rate control due to not having awareness of condition.  We will proceed with conservative observation.   Secondary Hypercoagulable State (ICD10:  D68.69) The patient is at significant risk for stroke/thromboembolism based upon her CHA2DS2-VASc Score of 6.  Continue Apixaban  (Eliquis ).  Continue Eliquis  5 mg twice daily.    Follow up May 2026 A-fib clinic.   [facility], [facility]  Afib Clinic [address] [place], [place] [address] [phone]

## 2024-04-01 ENCOUNTER — Other Ambulatory Visit: Payer: Self-pay | Admitting: Family Medicine

## 2024-04-01 DIAGNOSIS — R062 Wheezing: Secondary | ICD-10-CM

## 2024-04-07 DIAGNOSIS — J441 Chronic obstructive pulmonary disease with (acute) exacerbation: Secondary | ICD-10-CM | POA: Diagnosis not present

## 2024-04-07 DIAGNOSIS — G9341 Metabolic encephalopathy: Secondary | ICD-10-CM | POA: Diagnosis not present

## 2024-04-07 DIAGNOSIS — I4891 Unspecified atrial fibrillation: Secondary | ICD-10-CM | POA: Diagnosis not present

## 2024-04-07 DIAGNOSIS — I5033 Acute on chronic diastolic (congestive) heart failure: Secondary | ICD-10-CM | POA: Diagnosis not present

## 2024-04-10 DIAGNOSIS — N1831 Chronic kidney disease, stage 3a: Secondary | ICD-10-CM | POA: Diagnosis not present

## 2024-04-10 DIAGNOSIS — D638 Anemia in other chronic diseases classified elsewhere: Secondary | ICD-10-CM | POA: Diagnosis not present

## 2024-04-10 DIAGNOSIS — I5032 Chronic diastolic (congestive) heart failure: Secondary | ICD-10-CM | POA: Diagnosis not present

## 2024-04-13 DIAGNOSIS — E6609 Other obesity due to excess calories: Secondary | ICD-10-CM | POA: Diagnosis not present

## 2024-04-13 DIAGNOSIS — Z79899 Other long term (current) drug therapy: Secondary | ICD-10-CM | POA: Diagnosis not present

## 2024-04-13 DIAGNOSIS — J441 Chronic obstructive pulmonary disease with (acute) exacerbation: Secondary | ICD-10-CM | POA: Diagnosis not present

## 2024-04-13 DIAGNOSIS — I4891 Unspecified atrial fibrillation: Secondary | ICD-10-CM | POA: Diagnosis not present

## 2024-04-13 DIAGNOSIS — M170 Bilateral primary osteoarthritis of knee: Secondary | ICD-10-CM | POA: Diagnosis not present

## 2024-04-13 DIAGNOSIS — L409 Psoriasis, unspecified: Secondary | ICD-10-CM | POA: Diagnosis not present

## 2024-04-13 DIAGNOSIS — I5033 Acute on chronic diastolic (congestive) heart failure: Secondary | ICD-10-CM | POA: Diagnosis not present

## 2024-04-13 DIAGNOSIS — D229 Melanocytic nevi, unspecified: Secondary | ICD-10-CM | POA: Diagnosis not present

## 2024-04-13 DIAGNOSIS — G9341 Metabolic encephalopathy: Secondary | ICD-10-CM | POA: Diagnosis not present

## 2024-04-21 ENCOUNTER — Encounter: Payer: Self-pay | Admitting: Family Medicine

## 2024-04-21 ENCOUNTER — Emergency Department (HOSPITAL_COMMUNITY)
Admission: EM | Admit: 2024-04-21 | Discharge: 2024-04-21 | Disposition: A | Discharge status: Home / Self Care | Attending: Emergency Medicine | Admitting: Emergency Medicine

## 2024-04-21 ENCOUNTER — Emergency Department (HOSPITAL_COMMUNITY)

## 2024-04-21 ENCOUNTER — Ambulatory Visit: Payer: Self-pay | Admitting: Family Medicine

## 2024-04-21 ENCOUNTER — Encounter (HOSPITAL_COMMUNITY): Payer: Self-pay

## 2024-04-21 VITALS — BP 119/79 | HR 155 | Temp 95.7°F | Ht 64.0 in | Wt 177.6 lb

## 2024-04-21 DIAGNOSIS — F331 Major depressive disorder, recurrent, moderate: Secondary | ICD-10-CM | POA: Diagnosis not present

## 2024-04-21 DIAGNOSIS — G479 Sleep disorder, unspecified: Secondary | ICD-10-CM

## 2024-04-21 DIAGNOSIS — I10 Essential (primary) hypertension: Secondary | ICD-10-CM

## 2024-04-21 DIAGNOSIS — I480 Paroxysmal atrial fibrillation: Secondary | ICD-10-CM | POA: Diagnosis not present

## 2024-04-21 DIAGNOSIS — Z7901 Long term (current) use of anticoagulants: Secondary | ICD-10-CM | POA: Insufficient documentation

## 2024-04-21 DIAGNOSIS — J9811 Atelectasis: Secondary | ICD-10-CM | POA: Diagnosis not present

## 2024-04-21 DIAGNOSIS — I130 Hypertensive heart and chronic kidney disease with heart failure and stage 1 through stage 4 chronic kidney disease, or unspecified chronic kidney disease: Secondary | ICD-10-CM | POA: Insufficient documentation

## 2024-04-21 DIAGNOSIS — Z79899 Other long term (current) drug therapy: Secondary | ICD-10-CM | POA: Insufficient documentation

## 2024-04-21 DIAGNOSIS — E782 Mixed hyperlipidemia: Secondary | ICD-10-CM

## 2024-04-21 DIAGNOSIS — R000 Tachycardia, unspecified: Secondary | ICD-10-CM | POA: Diagnosis not present

## 2024-04-21 DIAGNOSIS — L408 Other psoriasis: Secondary | ICD-10-CM

## 2024-04-21 DIAGNOSIS — I509 Heart failure, unspecified: Secondary | ICD-10-CM | POA: Diagnosis not present

## 2024-04-21 DIAGNOSIS — K219 Gastro-esophageal reflux disease without esophagitis: Secondary | ICD-10-CM

## 2024-04-21 DIAGNOSIS — N189 Chronic kidney disease, unspecified: Secondary | ICD-10-CM | POA: Insufficient documentation

## 2024-04-21 DIAGNOSIS — I4891 Unspecified atrial fibrillation: Secondary | ICD-10-CM

## 2024-04-21 LAB — BASIC METABOLIC PANEL WITH GFR
Anion gap: 11 (ref 5–15)
BUN: 15 mg/dL (ref 8–23)
CO2: 31 mmol/L (ref 22–32)
Calcium: 10 mg/dL (ref 8.9–10.3)
Chloride: 99 mmol/L (ref 98–111)
Creatinine, Ser: 1 mg/dL (ref 0.44–1.00)
GFR, Estimated: 57 mL/min — ABNORMAL LOW (ref >60)
Glucose, Bld: 111 mg/dL — ABNORMAL HIGH (ref 70–99)
Potassium: 4.9 mmol/L (ref 3.5–5.1)
Sodium: 141 mmol/L (ref 135–145)

## 2024-04-21 LAB — MAGNESIUM: Magnesium: 1.4 mg/dL — ABNORMAL LOW (ref 1.7–2.4)

## 2024-04-21 LAB — CBC
HCT: 40.2 % (ref 36.0–46.0)
Hemoglobin: 12.7 g/dL (ref 12.0–15.0)
MCH: 32.1 pg (ref 26.0–34.0)
MCHC: 31.6 g/dL (ref 30.0–36.0)
MCV: 101.5 fL — ABNORMAL HIGH (ref 80.0–100.0)
Platelets: 230 K/uL (ref 150–400)
RBC: 3.96 MIL/uL (ref 3.87–5.11)
RDW: 13.1 % (ref 11.5–15.5)
WBC: 11.7 K/uL — ABNORMAL HIGH (ref 4.0–10.5)
nRBC: 0 % (ref 0.0–0.2)

## 2024-04-21 MED ORDER — TRAZODONE HCL 50 MG PO TABS: 50.0000 mg | ORAL_TABLET | Freq: Every day | ORAL | 1 refills | Status: AC

## 2024-04-21 MED ORDER — PANTOPRAZOLE SODIUM 40 MG PO TBEC: 40.0000 mg | DELAYED_RELEASE_TABLET | Freq: Every day | ORAL | 1 refills | Status: AC

## 2024-04-21 MED ORDER — MAGNESIUM SULFATE 2 GM/50ML IV SOLN
2.0000 g | Freq: Once | INTRAVENOUS | Status: AC
  Administered 2024-04-21: 2 g via INTRAVENOUS
  Filled 2024-04-21: qty 50

## 2024-04-21 MED ORDER — DILTIAZEM HCL 25 MG/5ML IV SOLN: 10.0000 mg | Freq: Once | INTRAVENOUS | Status: DC

## 2024-04-21 MED ORDER — METOPROLOL TARTRATE 5 MG/5ML IV SOLN: 5.0000 mg | INTRAVENOUS | Status: DC | PRN

## 2024-04-21 NOTE — Discharge Instructions (Signed)
° °  You were seen today for atrial fibrillation with a rapid heart rate.  You spontaneously converted to a regular rhythm while you were here.  Your workup today shows that your magnesium  level was low.  You have been given magnesium  here.  You will need to have your magnesium  level rechecked next week.  Also, as we discussed please follow-up with the A-fib clinic later this week or early next week.  You may arrange follow-up with your primary care provider as well.  Return to the emergency department if you develop any new or worsening symptoms.

## 2024-04-21 NOTE — ED Triage Notes (Addendum)
 Pt bib [facility], pt went to see her PCP for a routine check up and they done and EKG noticed HR in the 150s. Pt has a hx of Afib. Pt has no complaints. On 3L at home and has chronic bilateral knee pain. Pt states she has not had her heart medication this morning but unable to tell me the names of medication.

## 2024-04-21 NOTE — ED Provider Notes (Signed)
  EMERGENCY DEPARTMENT AT [facility] Provider Note   CSN: [id number] Arrival date & time: 04/21/24  1024     Patient presents with: Atrial Fibrillation   Denise Underwood is a 78 y.o. female.    Atrial Fibrillation       Denise Underwood is a 78 y.o. female past medical history of hypertension, GAD, paroxysmal atrial fibrillation, chronically anticoagulated on Eliquis , CHF, CKD who presents to the Emergency Department via EMS for evaluation of A-fib with RVR.  Patient was at her PCPs office this morning and was noted to have heart rate in the 150s.  She denies any symptoms currently.  EMS was contacted and patient brought here for evaluation.  She has not taken her morning medication States that she was on 25 mg of metoprolol  twice daily but after a hospital stay at [facility] the medicine was discontinued.  She had follow-up at the A-fib clinic and was told to restart the medication but to only take half a tablet twice a day.  She denies any recent illness, fever, chills, chest pain or shortness of breath at this time.  She is chronically on 3 L O2 at baseline.   She is followed at the A-fib clinic with last appointment 03/31/2024.  The patient's CHA2DS2-VASc score is 6   Prior to Admission medications   Medication Sig Start Date End Date Taking? Authorizing Provider  acetaminophen  (TYLENOL ) 325 MG tablet Take 2 tablets (650 mg total) by mouth every 6 (six) hours as needed for mild pain (pain score 1-3) or fever (or Fever >/= 101). 01/28/24   Ricky Fines, MD  acetaminophen  (TYLENOL ) 650 MG suppository Place 1 suppository (650 mg total) rectally every 6 (six) hours as needed for mild pain (pain score 1-3) or fever (or Fever >/= 101). 01/28/24   Ricky Fines, MD  albuterol  (VENTOLIN  HFA) 108 [phone] Base) MCG/ACT inhaler inhale 2 puffs into the lungs every 6 (six) hours as needed for wheezing or shortness of breath. 04/02/24   [name], FNP  apixaban  (ELIQUIS ) 5 MG  TABS tablet Take 1 tablet (5 mg total) by mouth 2 (two) times daily. 10/28/23   Pearlean Manus, MD  clobetasol  cream (TEMOVATE ) 0.05 % Apply topically 2 (two) times daily. 01/28/24   Ricky Fines, MD  cycloSPORINE  50 mg in dextrose  5 % 250 mL Inject 50 mg into the vein every 12 (twelve) hours. 01/28/24   Ricky Fines, MD  escitalopram  (LEXAPRO ) 20 MG tablet Take 1 tablet (20 mg total) by mouth daily. 10/14/23   [name], FNP  fluticasone -salmeterol (ADVAIR) 250-50 MCG/ACT AEPB inhale 1 puff into the lungs in the morning and at bedtime. 12/03/23   [name], FNP  furosemide  (LASIX ) 20 MG tablet Take 1 tablet (20 mg total) by mouth daily as needed. For Fluid 10/28/23   Pearlean Manus, MD  hydrOXYzine  (ATARAX ) 10 MG tablet Take 1 tablet (10 mg total) by mouth every 6 (six) hours as needed for itching. 01/28/24   Ricky Fines, MD  metoprolol  tartrate (LOPRESSOR ) 25 MG tablet Take 0.5 tablets (12.5 mg total) by mouth 2 (two) times daily. 03/23/24   Rana Lum CROME, NP  nystatin  (MYCOSTATIN /NYSTOP ) powder Apply 1 Application topically 3 (three) times daily. 10/14/23   [name], FNP  pantoprazole  (PROTONIX ) 40 MG tablet Take 1 tablet (40 mg total) by mouth daily. 04/21/24   [name], FNP  traMADol  (ULTRAM ) 50 MG tablet Take 1 tablet (50 mg total)  by mouth every 8 (eight) hours as needed for severe pain (pain score 7-10). 01/28/24   Ricky Fines, MD  traZODone  (DESYREL ) 50 MG tablet Take 1 tablet (50 mg total) by mouth at bedtime. 04/21/24   [name], FNP    Allergies: Patient has no known allergies.    Review of Systems  All other systems reviewed and are negative.   Updated Vital Signs BP (!) 149/126 (BP Location: Left Arm)   Pulse (!) 133   Temp 97.6 F (36.4 C) (Oral)   Resp (!) 27   Ht 5' 4 (1.626 m)   Wt 80.3 kg   SpO2 98%   BMI 30.38 kg/m   Physical Exam Vitals and nursing note reviewed.  Constitutional:      General: She is not in acute  distress.    Appearance: Normal appearance. She is not toxic-appearing.  Cardiovascular:     Rate and Rhythm: Tachycardia present. Rhythm irregular.     Pulses: Normal pulses.  Pulmonary:     Effort: Pulmonary effort is normal.  Chest:     Chest wall: No tenderness.  Abdominal:     Palpations: Abdomen is soft.     Tenderness: There is no abdominal tenderness.  Musculoskeletal:     Right lower leg: No edema.     Left lower leg: No edema.  Skin:    General: Skin is warm.  Neurological:     General: No focal deficit present.     Mental Status: She is alert.     Sensory: No sensory deficit.     Motor: No weakness.     (all labs ordered are listed, but only abnormal results are displayed) Labs Reviewed  BASIC METABOLIC PANEL WITH GFR - Abnormal; Notable for the following components:      Result Value   Glucose, Bld 111 (*)    GFR, Estimated 57 (*)    All other components within normal limits  MAGNESIUM  - Abnormal; Notable for the following components:   Magnesium  1.4 (*)    All other components within normal limits  CBC - Abnormal; Notable for the following components:   WBC 11.7 (*)    MCV 101.5 (*)    All other components within normal limits    EKG: EKG Interpretation Date/Time:  Wednesday April 21 2024 11:13:30 EST Ventricular Rate:  85 PR Interval:  150 QRS Duration:  85 QT Interval:  385 QTC Calculation: 458 R Axis:   5  Text Interpretation: Sinus rhythm Confirmed by Cleotilde Rogue ([id number]) on 04/21/2024 11:48:19 AM  Radiology: No results found.   Procedures   Medications Ordered in the ED - No data to display                                  Medical Decision Making   Patient brought in by EMS from PCPs office for evaluation of tachycardia and possible A-fib with RVR.  Patient was noted to have heart rate in the 150s. patient has a history of atrial fib chronically anticoagulated.  Arrived in A-fib with RVR.  Denies any symptoms at this time.   Has  not taken her medications today  On my exam, patient well-appearing.  Appears hemodynamically stable.  Hypertensive with current heart rate in the 130s.  No hypoxia she is on 3 L of O2 which is her baseline.  Patient is asymptomatic at this time, she does follow with  the A-fib clinic.  States she is unaware of a history of A-fib.  Doubt ACS pneumonia or PE.  She appears very hemodynamically stable at this time    Amount and/or Complexity of Data Reviewed Labs: ordered.    Details: Labs show mild hypomagnesemia otherwise reassuring Radiology: ordered.    Details: Chest x-ray shows mild bibasilar atelectasis with no pleural effusion or consolidation ECG/medicine tests: ordered.    Details: Initial EKG Shows atrial fibs flutter, right bundle branch block ventricular rate of 127  Patient spontaneously converted, repeat EKG now shows sinus rhythm   Discussion of management or test interpretation with external provider(s):   1120 notified by nursing staff that patient spontaneously converted to normal sinus rhythm and her heart rate has been in the 80s to low 90s.  Current blood pressure 144/76 patient remains asymptomatic.  She has tolerated oral fluids and crackers here, continues to deny symptoms.  She has been observed in the department without further arrhythmias or tachycardia.   Her workup today did show that her magnesium  level was low and she was given magnesium  here.  She is agreeable to take her metoprolol  when she gets home.  I feel that she is appropriate for discharge home, she does have upcoming appointment with the A-fib clinic.  I have also recommended close outpatient follow-up with her PCP.  She was given strict return precautions as well.  Risk Prescription drug management.        Final diagnoses:  Hypomagnesemia  Paroxysmal atrial fibrillation with RVR [facility])    ED Discharge Orders     None          Herlinda Milling, PA-C 04/23/24 1415    Cleotilde Rogue, MD 04/25/24 [phone]

## 2024-04-21 NOTE — ED Notes (Signed)
 Pt converted from A-fib. Provider aware

## 2024-04-21 NOTE — Progress Notes (Addendum)
 Subjective:  Patient ID: Denise Underwood, female    DOB: November 01, 1945, 78 y.o.   MRN: [id number]  Patient Care Team: [name], FNP as PCP - General (Family Medicine) Kate Lonni CROME, MD as PCP - Cardiology (Cardiology) Orpha Yancey LABOR, MD (Internal Medicine) Lyndol Pama HERO, MD as Consulting Physician (Internal Medicine) Darlean Ozell NOVAK, MD as Consulting Physician (Pulmonary Disease) Rana Lum CROME, NP as Nurse Practitioner (Cardiology)   Chief Complaint:  Hospitalization Follow-up (01/24/2024 - 01/28/2024 (4 days)/[facility])   HPI: Denise Underwood is a 78 y.o. female presenting on 04/21/2024 for Hospitalization Follow-up (01/24/2024 - 01/28/2024 (4 days)/[facility])   Denise Underwood is a 78 year old female who presents with tachycardia and palpitations.  She experiences a heart rate in the 150s and palpitations. She has not taken her metoprolol , which she usually takes twice a day. She denies drinking water  this morning.  She has a history of sepsis and has been hospitalized multiple times for it. She recalls a previous episode of sepsis associated with an infection in her leg, after which she developed psoriasis.  No weakness, confusion, fevers, chills, cough, or shortness of breath.     She has also been admitted recently for her psoriasis.    Relevant past medical, surgical, family, and social history reviewed and updated as indicated.  Allergies and medications reviewed and updated. Data reviewed: Chart in Epic.   Past Medical History:  Diagnosis Date   Anxiety    Arthritis    Hyperlipidemia    Hypertension    Osteopenia 07/04/2021   Prediabetes 07/09/2021   Psoriasis     Past Surgical History:  Procedure Laterality Date   ABDOMINAL HYSTERECTOMY     CHOLECYSTECTOMY     HERNIA REPAIR     KNEE SURGERY     TUBAL LIGATION      Social History   Socioeconomic History   Marital status: Single    Spouse name: Not  on file   Number of children: 2   Years of education: Not on file   Highest education level: 9th grade  Occupational History   Occupation: Retired  Tobacco Use   Smoking status: Former    Current packs/day: 0.50    Average packs/day: 0.5 packs/day for 40.0 years (20.0 ttl pk-yrs)    Types: Cigarettes   Smokeless tobacco: Never   Tobacco comments:    Former smoker 03/31/24  Vaping Use   Vaping status: Never Used  Substance and Sexual Activity   Alcohol  use: No   Drug use: No   Sexual activity: Not Currently    Birth control/protection: Surgical  Other Topics Concern   Not on file  Social History Narrative   She lives alone on ground level apartment - has children.    Daughter, Karna is her main caregiver and means for transportation.   She also has an aide that comes in for 2 hours on weekdays to help with bathing, meals, meds etc   Social Drivers of Health   Financial Resource Strain: Low Risk  (02/18/2024)   Overall Financial Resource Strain (CARDIA)    Difficulty of Paying Living Expenses: Not hard at all  Food Insecurity: No Food Insecurity (02/18/2024)   Hunger Vital Sign    Worried About Running Out of Food in the Last Year: Never true    Ran Out of Food in the Last Year: Never true  Transportation Needs: No Transportation Needs (02/18/2024)  PRAPARE - Administrator, Civil Service (Medical): No    Lack of Transportation (Non-Medical): No  Physical Activity: Insufficiently Active (02/18/2024)   Exercise Vital Sign    Days of Exercise per Week: 3 days    Minutes of Exercise per Session: 30 min  Stress: No Stress Concern Present (02/18/2024)   [employer] of Occupational Health - Occupational Stress Questionnaire    Feeling of Stress: Not at all  Social Connections: Moderately Isolated (02/18/2024)   Social Connection and Isolation Panel    Frequency of Communication with Friends and Family: More than three times a week    Frequency of Social  Gatherings with Friends and Family: More than three times a week    Attends Religious Services: 1 to 4 times per year    Active Member of [employer] or Organizations: No    Attends Banker Meetings: Never    Marital Status: Widowed  Intimate Partner Violence: Not At Risk (02/18/2024)   Humiliation, Afraid, Rape, and Kick questionnaire    Fear of Current or Ex-Partner: No    Emotionally Abused: No    Physically Abused: No    Sexually Abused: No    Outpatient Encounter Medications as of 04/21/2024  Medication Sig   acetaminophen  (TYLENOL ) 325 MG tablet Take 2 tablets (650 mg total) by mouth every 6 (six) hours as needed for mild pain (pain score 1-3) or fever (or Fever >/= 101).   acetaminophen  (TYLENOL ) 650 MG suppository Place 1 suppository (650 mg total) rectally every 6 (six) hours as needed for mild pain (pain score 1-3) or fever (or Fever >/= 101).   albuterol  (VENTOLIN  HFA) 108 (90 Base) MCG/ACT inhaler inhale 2 puffs into the lungs every 6 (six) hours as needed for wheezing or shortness of breath.   apixaban  (ELIQUIS ) 5 MG TABS tablet Take 1 tablet (5 mg total) by mouth 2 (two) times daily.   clobetasol  cream (TEMOVATE ) 0.05 % Apply topically 2 (two) times daily.   cycloSPORINE  50 mg in dextrose  5 % 250 mL Inject 50 mg into the vein every 12 (twelve) hours.   escitalopram  (LEXAPRO ) 20 MG tablet Take 1 tablet (20 mg total) by mouth daily.   fluticasone -salmeterol (ADVAIR) 250-50 MCG/ACT AEPB inhale 1 puff into the lungs in the morning and at bedtime.   furosemide  (LASIX ) 20 MG tablet Take 1 tablet (20 mg total) by mouth daily as needed. For Fluid   hydrOXYzine  (ATARAX ) 10 MG tablet Take 1 tablet (10 mg total) by mouth every 6 (six) hours as needed for itching.   metoprolol  tartrate (LOPRESSOR ) 25 MG tablet Take 0.5 tablets (12.5 mg total) by mouth 2 (two) times daily.   nystatin  (MYCOSTATIN /NYSTOP ) powder Apply 1 Application topically 3 (three) times daily.   traMADol  (ULTRAM )  50 MG tablet Take 1 tablet (50 mg total) by mouth every 8 (eight) hours as needed for severe pain (pain score 7-10).   pantoprazole  (PROTONIX ) 40 MG tablet Take 1 tablet (40 mg total) by mouth daily.   traZODone  (DESYREL ) 50 MG tablet Take 1 tablet (50 mg total) by mouth at bedtime.   [DISCONTINUED] pantoprazole  (PROTONIX ) 40 MG tablet take 1 tablet (40 milligram total) by mouth daily.   [DISCONTINUED] traZODone  (DESYREL ) 50 MG tablet take 1 tablet (50 milligram total) by mouth at bedtime.   No facility-administered encounter medications on file as of 04/21/2024.    No Known Allergies  Pertinent ROS per HPI, otherwise unremarkable      Objective:  BP 119/79   Pulse (!) 155   Temp (!) 95.7 F (35.4 C)   Ht 5' 4 (1.626 m)   Wt 177 lb 9.6 oz (80.6 kg)   SpO2 95%   BMI 30.48 kg/m    Wt Readings from Last 3 Encounters:  04/21/24 177 lb 9.6 oz (80.6 kg)  03/31/24 179 lb 12.8 oz (81.6 kg)  03/23/24 178 lb (80.7 kg)    Physical Exam Vitals and nursing note reviewed.  Constitutional:      General: She is not in acute distress.    Appearance: She is obese. She is ill-appearing. She is not toxic-appearing or diaphoretic.  HENT:     Head: Normocephalic and atraumatic.     Nose: Nose normal.     Mouth/Throat:     Mouth: Mucous membranes are moist.  Eyes:     Conjunctiva/sclera: Conjunctivae normal.     Pupils: Pupils are equal, round, and reactive to light.  Cardiovascular:     Rate and Rhythm: Regular rhythm. Tachycardia present.     Heart sounds: Normal heart sounds.  Pulmonary:     Effort: Pulmonary effort is normal.     Breath sounds: Decreased breath sounds present. No wheezing or rhonchi.     Comments: On supplemental oxygen  via [place] Abdominal:     Palpations: Abdomen is soft.  Musculoskeletal:     Right lower leg: Edema present.     Left lower leg: Edema present.  Skin:    General: Skin is warm and dry.     Capillary Refill: Capillary refill takes less than 2  seconds.  Neurological:     General: No focal deficit present.     Mental Status: She is alert and oriented to person, place, and time.     Gait: Gait abnormal (using rollator).  Psychiatric:        Mood and Affect: Mood normal.        Behavior: Behavior normal. Behavior is cooperative.        Thought Content: Thought content normal.        Judgment: Judgment normal.    EKG: Tachycardia, ? Atrial tachycardia, 154, QT 316 ms,  no acute ST-T changes. Rosaline Bruns, FNP-C  Results for orders placed or performed in visit on 03/23/24  CBC   Collection Time: 03/24/24 12:07 PM  Result Value Ref Range   WBC 12.9 (H) 3.4 - 10.8 x10E3/uL   RBC 3.47 (L) 3.77 - 5.28 x10E6/uL   Hemoglobin 11.0 (L) 11.1 - 15.9 g/dL   Hematocrit 65.6 65.9 - 46.6 %   MCV 99 (H) 79 - 97 fL   MCH 31.7 26.6 - 33.0 pg   MCHC 32.1 31.5 - 35.7 g/dL   RDW 87.1 88.2 - 84.5 %   Platelets 271 150 - 450 x10E3/uL  Basic metabolic panel with GFR   Collection Time: 03/24/24 12:07 PM  Result Value Ref Range   Glucose 107 (H) 70 - 99 mg/dL   BUN 13 8 - 27 mg/dL   Creatinine, Ser 8.98 (H) 0.57 - 1.00 mg/dL   eGFR 57 (L) >40 fO/fpw/8.26   BUN/Creatinine Ratio 13 12 - 28   Sodium 141 134 - 144 mmol/L   Potassium 4.9 3.5 - 5.2 mmol/L   Chloride 99 96 - 106 mmol/L   CO2 27 20 - 29 mmol/L   Calcium  9.4 8.7 - 10.3 mg/dL       Pertinent labs & imaging results that were available during my care of the  patient were reviewed by me and considered in my medical decision making.  Assessment & Plan:  Inge was seen today for hospitalization follow-up.  Diagnoses and all orders for this visit:  Tachycardia -     EKG 12-Lead  Moderate episode of recurrent major depressive disorder (HCC) -     traZODone  (DESYREL ) 50 MG tablet; Take 1 tablet (50 mg total) by mouth at bedtime.  Difficulty sleeping -     traZODone  (DESYREL ) 50 MG tablet; Take 1 tablet (50 mg total) by mouth at bedtime.  Primary hypertension -     EKG  12-Lead  Gastroesophageal reflux disease without esophagitis -     pantoprazole  (PROTONIX ) 40 MG tablet; Take 1 tablet (40 mg total) by mouth daily.  Erythrodermic psoriasis Followed by dermatology       Tachyarrhythmia Heart rate in the 150s with abnormal rhythm, concerning for potential underlying causes such as dehydration or infection. Not taking metoprolol  as prescribed. Risk of myocardial infarction, heart failure, or pulmonary embolism if untreated. - Arranged EMS transport to the hospital for further evaluation and management.  Suspected sepsis Concern for sepsis due to history of sepsis and current symptoms. Low normal blood pressure and high heart rate raise suspicion for an infectious process. Differential includes pneumonia or urinary tract infection leading to systemic infection. - Arranged EMS transport to the hospital for evaluation and management of suspected sepsis.      Refills on chronic medications provided today. Pt tolerating all chronic medications well.     Continue all other maintenance medications.  Follow up plan: ED via EMS  The above assessment and management plan was discussed with the patient. The patient verbalized understanding of and has agreed to the management plan. Patient is aware to call the clinic if they develop any new symptoms or if symptoms persist or worsen. Patient is aware when to return to the clinic for a follow-up visit. Patient educated on when it is appropriate to go to the emergency department.   Rosaline Bruns, FNP-C [facility] [phone]

## 2024-04-29 ENCOUNTER — Telehealth: Payer: Self-pay | Admitting: Family Medicine

## 2024-04-29 NOTE — Telephone Encounter (Signed)
 Patient had some medications. I went through her list and informed that Rosaline Bruns, NP had sent trazodone  and pantoprazole  to [facility] pharmacy.  She said she would call them.

## 2024-04-29 NOTE — Telephone Encounter (Signed)
 Copied from CRM #[id number]. Topic: Clinical - Medical Advice >> Apr 28, 2024  3:34 PM Sophia H wrote: Reason for CRM: Patient states she would like to speak with PCP's MA - would not provide any details. Please reach out # [phone]  Patient states it is regarding medications she did not get filled from [facility] but she is unsure of the names. Patient states she would prefer to speak with clinic regarding this   [facility] [place], [place] - [address]

## 2024-04-30 ENCOUNTER — Ambulatory Visit (HOSPITAL_COMMUNITY)
Admission: RE | Admit: 2024-04-30 | Discharge: 2024-04-30 | Disposition: A | Discharge status: Home / Self Care | Source: Ambulatory Visit | Attending: Internal Medicine | Admitting: Internal Medicine

## 2024-04-30 ENCOUNTER — Encounter (HOSPITAL_COMMUNITY): Payer: Self-pay | Admitting: Internal Medicine

## 2024-04-30 VITALS — BP 132/80 | HR 80 | Ht 64.0 in | Wt 184.4 lb

## 2024-04-30 DIAGNOSIS — D6869 Other thrombophilia: Secondary | ICD-10-CM

## 2024-04-30 DIAGNOSIS — I4819 Other persistent atrial fibrillation: Secondary | ICD-10-CM | POA: Diagnosis not present

## 2024-04-30 NOTE — Progress Notes (Signed)
 "   Primary Care Physician: [name], FNP Primary Cardiologist: Lonni LITTIE [name], MD Electrophysiologist: None     Referring Physician: Rana Lum LITTIE, NP     Denise Underwood is a 78 y.o. female with a history of aortic stenosis, chronic diastolic CHF, hypertension, hyperlipidemia, COPD, tobacco use, chronic use of 3 L of oxygen  via nasal cannula, and atrial fibrillation who presents for consultation in the [facility].  Outside hospital admission September 2025 for psoriasis exacerbation with concern for cellulitis found to be in A-fib on admission.  Seen by cardiology on 11/11 and noted to be in A-fib with RVR.  Patient is on Eliquis  for stroke prevention.  Follow-up 04/30/2024.  Patient was in the ED on 12/10 due to A-fib with RVR.  She was at her PCP office earlier that morning  and noted to have heart rate in the 150s.  She does not have cardiac awareness.  She spontaneously converted to SR while in the ED.  No missed doses of Eliquis .  Today, she denies symptoms of palpitations, chest pain, shortness of breath, orthopnea, PND, lower extremity edema, dizziness, presyncope, syncope, bleeding, or neurologic sequela. The patient is tolerating medications without difficulties and is otherwise without complaint today.   she has a BMI of Body mass index is 31.65 kg/m.Denise Underwood Filed Weights   04/30/24 1350  Weight: 83.6 kg     Current Outpatient Medications  Medication Sig Dispense Refill   acetaminophen  (TYLENOL ) 325 MG tablet Take 2 tablets (650 mg total) by mouth every 6 (six) hours as needed for mild pain (pain score 1-3) or fever (or Fever >/= 101).     acetaminophen  (TYLENOL ) 650 MG suppository Place 1 suppository (650 mg total) rectally every 6 (six) hours as needed for mild pain (pain score 1-3) or fever (or Fever >/= 101).     albuterol  (VENTOLIN  HFA) 108 (90 Base) MCG/ACT inhaler inhale 2 puffs into the lungs every 6 (six) hours as needed for  wheezing or shortness of breath. 18 g 11   apixaban  (ELIQUIS ) 5 MG TABS tablet Take 1 tablet (5 mg total) by mouth 2 (two) times daily. 180 tablet 1   clobetasol  cream (TEMOVATE ) 0.05 % Apply topically 2 (two) times daily. 30 g 0   escitalopram  (LEXAPRO ) 20 MG tablet Take 1 tablet (20 mg total) by mouth daily. 90 tablet 1   fluticasone -salmeterol (ADVAIR) 250-50 MCG/ACT AEPB inhale 1 puff into the lungs in the morning and at bedtime. (Patient taking differently: Inhale 1 puff into the lungs daily. in the morning and at bedtime.) 30 each 5   furosemide  (LASIX ) 20 MG tablet Take 1 tablet (20 mg total) by mouth daily as needed. For Fluid 90 tablet 1   HYDROcodone -acetaminophen  (NORCO/VICODIN) 5-325 MG tablet Take 1 tablet by mouth every 6 (six) hours as needed for moderate pain (pain score 4-6).     metoprolol  tartrate (LOPRESSOR ) 25 MG tablet Take 0.5 tablets (12.5 mg total) by mouth 2 (two) times daily. 180 tablet 3   nystatin  (MYCOSTATIN /NYSTOP ) powder Apply 1 Application topically 3 (three) times daily. 60 g 0   pantoprazole  (PROTONIX ) 40 MG tablet Take 1 tablet (40 mg total) by mouth daily. 90 tablet 1   traZODone  (DESYREL ) 50 MG tablet Take 1 tablet (50 mg total) by mouth at bedtime. 90 tablet 1   No current facility-administered medications for this encounter.    Atrial Fibrillation Management history:  Previous antiarrhythmic drugs: none Previous cardioversions: none Previous  ablations: none Anticoagulation history: Eliquis    ROS- All systems are reviewed and negative except as per the HPI above.  Physical Exam: BP 132/80   Pulse 80   Ht 5' 4 (1.626 m)   Wt 83.6 kg   BMI 31.65 kg/m   GEN- The patient is well appearing, alert and oriented x 3 today.   Neck - no JVD or carotid bruit noted Lungs- Clear to ausculation bilaterally, normal work of breathing Heart- Regular rate and rhythm, no murmurs, rubs or gallops, PMI not laterally displaced Extremities- no clubbing, cyanosis,  or edema Skin - no rash or ecchymosis noted   EKG today demonstrates  EKG Interpretation Date/Time:  Friday April 30 2024 13:52:37 EST Ventricular Rate:  80 PR Interval:  150 QRS Duration:  86 QT Interval:  400 QTC Calculation: 461 R Axis:   24  Text Interpretation: Normal sinus rhythm Normal ECG When compared with ECG of 21-Apr-2024 11:13, PREVIOUS ECG IS PRESENT Confirmed by Terra Pac ([id number]) on 04/30/2024 2:37:17 PM    Echo 02/04/23 demonstrated  1. Left ventricular ejection fraction, by estimation, is 60 to 65%. The  left ventricle has normal function. The left ventricle has no regional  wall motion abnormalities. Left ventricular diastolic parameters are  consistent with Grade I diastolic  dysfunction (impaired relaxation).   2. Right ventricular systolic function is normal. The right ventricular  size is mildly enlarged. There is normal pulmonary artery systolic  pressure.   3. Left atrial size was mildly dilated.   4. Right atrial size was mildly dilated.   5. The mitral valve is degenerative. No evidence of mitral valve  regurgitation. No evidence of mitral stenosis.   6. The aortic valve is calcified. Aortic valve regurgitation is not  visualized. Mild aortic valve stenosis. Aortic valve mean gradient  measures 14.0 mmHg. Aortic valve Vmax measures 2.48 m/s.   7. The inferior vena cava is dilated in size with <50% respiratory  variability, suggesting right atrial pressure of 15 mmHg.   ASSESSMENT & PLAN CHA2DS2-VASc Score = 6  The patient's score is based upon: CHF History: 1 HTN History: 1 Diabetes History: 0 Stroke History: 0 Vascular Disease History: 1 Age Score: 2 Gender Score: 1       ASSESSMENT AND PLAN: Persistent Atrial Fibrillation (ICD10:  I48.19) The patient's CHA2DS2-VASc score is 6, indicating a 9.7% annual risk of stroke.    Patient is currently in NSR.  She takes Lopressor  12.5 mg twice daily.  Due to HFpEF, patient takes Lasix  20 mg  about 2-4 days a week.  We discussed rhythm control options, which at this time due to chronic use of oxygen , she would not be a favorable candidate for ablation and appears limited to antiarrhythmic medication therapy.  Due to patient's necessity of requiring Lasix  several times a week for chronic diastolic CHF, we will plan to avoid Multaq and flecainide.  Her baseline QTc appears to be prohibitive of Tikosyn.  We discussed amiodarone and its potential adverse effects.  Patient appears to have a history of COPD without any specific indication of interstitial lung disease.  Although it may be less favorable to consider amiodarone, it appears she has limited options for rhythm control.  Even though she does not appear to have cardiac awareness when in A-fib, she appears to have significant RVR with A-fib.  Despite the potential adverse effects, would recommend amiodarone for rhythm control.  After discussion, patient is interested in pursuing amiodarone for long-term rhythm control.  They will follow-up with PCP to discuss options to transition away from Lexapro .  They will contact the clinic once transition has been successful and we can then begin amiodarone load.    Secondary Hypercoagulable State (ICD10:  D68.69) The patient is at significant risk for stroke/thromboembolism based upon her CHA2DS2-VASc Score of 6.  Continue Apixaban  (Eliquis ).  No missed doses of Eliquis .  Continue Eliquis  5 mg twice daily.   Patient will contact clinic once they have transitioned away from Lexapro  to begin amiodarone.   Terra Pac, [facility] [address] [place], [place] [address] [phone]  "

## 2024-04-30 NOTE — Patient Instructions (Signed)
 Patient has had 2 recent episodes of A-fib with RVR.  She has limited options for rhythm control due to requirement of oxygen  via nasal cannula.  Despite history of COPD, it would still be reasonable to consider amiodarone as a potential antiarrhythmic medication for long-term rhythm control.  The typical recommendation would be to consider transition away from escitalopram  to a different medication for anxiety/depression.  Other options that would be okay with monitoring or preferred would consist of sertraline, Cymbalta, or Effexor.  Once the patient has been safely transitioned from Lexapro  to any of these other agents and is tolerating it without issue, would please advise to contact clinic to begin amiodarone load and schedule EKG visit.

## 2024-05-07 ENCOUNTER — Other Ambulatory Visit: Payer: Self-pay | Admitting: Family Medicine

## 2024-05-07 DIAGNOSIS — K219 Gastro-esophageal reflux disease without esophagitis: Secondary | ICD-10-CM

## 2024-05-07 DIAGNOSIS — I5033 Acute on chronic diastolic (congestive) heart failure: Secondary | ICD-10-CM | POA: Diagnosis not present

## 2024-05-07 DIAGNOSIS — F411 Generalized anxiety disorder: Secondary | ICD-10-CM

## 2024-05-07 DIAGNOSIS — I480 Paroxysmal atrial fibrillation: Secondary | ICD-10-CM

## 2024-05-07 DIAGNOSIS — G9341 Metabolic encephalopathy: Secondary | ICD-10-CM | POA: Diagnosis not present

## 2024-05-07 DIAGNOSIS — J441 Chronic obstructive pulmonary disease with (acute) exacerbation: Secondary | ICD-10-CM | POA: Diagnosis not present

## 2024-05-07 DIAGNOSIS — I4891 Unspecified atrial fibrillation: Secondary | ICD-10-CM | POA: Diagnosis not present

## 2024-05-07 DIAGNOSIS — F331 Major depressive disorder, recurrent, moderate: Secondary | ICD-10-CM

## 2024-05-07 NOTE — Telephone Encounter (Signed)
 Copied from CRM [id number]. Topic: Clinical - Medication Refill >> May 07, 2024  1:52 PM Wess S wrote: Medication: apixaban  (ELIQUIS ) 5 MG TABS tablet  escitalopram  (LEXAPRO ) 20 MG tablet  fluticasone -salmeterol (ADVAIR) 250-50 MCG/ACT AEPB  metoprolol  tartrate (LOPRESSOR ) 25 MG tablet  pantoprazole  (PROTONIX ) 40 MG tablet    Has the patient contacted their pharmacy? Yes (Agent: If no, request that the patient contact the pharmacy for the refill. If patient does not wish to contact the pharmacy document the reason why and proceed with request.) (Agent: If yes, when and what did the pharmacy advise?)  This is the patient's preferred pharmacy:  [facility] - [place], [place] - [address] [place] [address] Phone: [phone] Fax: [phone]  Is this the correct pharmacy for this prescription? Yes If no, delete pharmacy and type the correct one.   Has the prescription been filled recently? Yes  Is the patient out of the medication? No  Has the patient been seen for an appointment in the last year OR does the patient have an upcoming appointment? Yes  Can we respond through MyChart? No  Agent: Please be advised that Rx refills may take up to 3 business days. We ask that you follow-up with your pharmacy.

## 2024-05-25 ENCOUNTER — Telehealth: Payer: Self-pay

## 2024-05-25 NOTE — Telephone Encounter (Signed)
 Copied from CRM [id number]. Topic: General - Other >> May 25, 2024 10:49 AM Joesph NOVAK wrote: Reason for CRM: patient would like to know when she needs to follow up with her PCP. Please advise and fu with patient .

## 2024-05-25 NOTE — Telephone Encounter (Signed)
 Per last OV notes, patient due to come back in and see PCP in 6 months (on or around 10/20/2024) for next follow up. Please call patient to make her a 6 month follow up with PCP.

## 2024-06-04 ENCOUNTER — Other Ambulatory Visit: Payer: Self-pay | Admitting: Family Medicine

## 2024-06-23 ENCOUNTER — Ambulatory Visit: Admitting: Emergency Medicine

## 2024-09-30 ENCOUNTER — Ambulatory Visit (HOSPITAL_COMMUNITY): Admitting: Internal Medicine

## 2024-10-20 ENCOUNTER — Ambulatory Visit: Admitting: Family Medicine

## 2025-02-18 ENCOUNTER — Ambulatory Visit: Payer: Self-pay
